# Patient Record
Sex: Male | Born: 1980 | Race: White | Hispanic: No | Marital: Single | Smoking: Never smoker
Health system: Southern US, Community
[De-identification: ages and names within clinical notes are randomized; demographics above are authoritative.]

## PROBLEM LIST (undated history)

## (undated) ENCOUNTER — Emergency Department (HOSPITAL_COMMUNITY): Admission: EM | Payer: Self-pay | Source: Home / Self Care

## (undated) DIAGNOSIS — F419 Anxiety disorder, unspecified: Secondary | ICD-10-CM

## (undated) DIAGNOSIS — M1990 Unspecified osteoarthritis, unspecified site: Secondary | ICD-10-CM

## (undated) DIAGNOSIS — E348 Other specified endocrine disorders: Secondary | ICD-10-CM

## (undated) DIAGNOSIS — T8859XA Other complications of anesthesia, initial encounter: Secondary | ICD-10-CM

## (undated) DIAGNOSIS — F4310 Post-traumatic stress disorder, unspecified: Secondary | ICD-10-CM

## (undated) HISTORY — DX: Unspecified osteoarthritis, unspecified site: M19.90

## (undated) HISTORY — PX: ANKLE SURGERY: SHX546

## (undated) HISTORY — DX: Anxiety disorder, unspecified: F41.9

---
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
## 2000-09-03 ENCOUNTER — Inpatient Hospital Stay (HOSPITAL_COMMUNITY): Admission: EM | Admit: 2000-09-03 | Discharge: 2000-09-05 | Payer: Self-pay | Admitting: *Deleted

## 2002-10-17 ENCOUNTER — Emergency Department (HOSPITAL_COMMUNITY): Admission: EM | Admit: 2002-10-17 | Discharge: 2002-10-17 | Payer: Self-pay | Admitting: Emergency Medicine

## 2003-12-31 ENCOUNTER — Emergency Department (HOSPITAL_COMMUNITY): Admission: EM | Admit: 2003-12-31 | Discharge: 2003-12-31 | Payer: Self-pay | Admitting: Emergency Medicine

## 2010-04-11 ENCOUNTER — Emergency Department (HOSPITAL_BASED_OUTPATIENT_CLINIC_OR_DEPARTMENT_OTHER)
Admission: EM | Admit: 2010-04-11 | Discharge: 2010-04-11 | Disposition: A | Discharge status: Other Acute Inpatient Hospital | Payer: Self-pay | Source: Home / Self Care | Admitting: Emergency Medicine

## 2010-06-25 LAB — POCT CARDIAC MARKERS

## 2010-06-25 LAB — CBC
HCT: 41.9 % (ref 39.0–52.0)
Hemoglobin: 15 g/dL (ref 13.0–17.0)
MCH: 29.7 pg (ref 26.0–34.0)
MCHC: 35.8 g/dL (ref 30.0–36.0)
MCV: 83 fL (ref 78.0–100.0)

## 2010-06-25 LAB — BASIC METABOLIC PANEL
CO2: 24 mEq/L (ref 19–32)
Chloride: 106 mEq/L (ref 96–112)
Glucose, Bld: 90 mg/dL (ref 70–99)
Potassium: 4.4 mEq/L (ref 3.5–5.1)
Sodium: 142 mEq/L (ref 135–145)

## 2010-06-25 LAB — DIFFERENTIAL
Basophils Relative: 0 % (ref 0–1)
Eosinophils Absolute: 0 10*3/uL (ref 0.0–0.7)
Eosinophils Relative: 0 % (ref 0–5)
Monocytes Absolute: 0.9 10*3/uL (ref 0.1–1.0)
Monocytes Relative: 8 % (ref 3–12)
Neutro Abs: 9.5 10*3/uL — ABNORMAL HIGH (ref 1.7–7.7)

## 2010-06-25 LAB — POCT TOXICOLOGY PANEL

## 2010-06-25 LAB — URINALYSIS, ROUTINE W REFLEX MICROSCOPIC
Bilirubin Urine: NEGATIVE
Hgb urine dipstick: NEGATIVE
Specific Gravity, Urine: 1.016 (ref 1.005–1.030)
Urobilinogen, UA: 0.2 mg/dL (ref 0.0–1.0)
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
## 2011-03-10 ENCOUNTER — Encounter: Payer: Self-pay | Admitting: *Deleted

## 2011-03-10 ENCOUNTER — Emergency Department (HOSPITAL_BASED_OUTPATIENT_CLINIC_OR_DEPARTMENT_OTHER)
Admission: EM | Admit: 2011-03-10 | Discharge: 2011-03-10 | Disposition: A | Discharge status: Home / Self Care | Payer: Self-pay | Attending: Emergency Medicine | Admitting: Emergency Medicine

## 2011-03-10 DIAGNOSIS — F419 Anxiety disorder, unspecified: Secondary | ICD-10-CM

## 2011-03-10 DIAGNOSIS — F411 Generalized anxiety disorder: Secondary | ICD-10-CM | POA: Insufficient documentation

## 2011-03-10 DIAGNOSIS — E348 Other specified endocrine disorders: Secondary | ICD-10-CM | POA: Insufficient documentation

## 2011-03-10 DIAGNOSIS — F3289 Other specified depressive episodes: Secondary | ICD-10-CM | POA: Insufficient documentation

## 2011-03-10 DIAGNOSIS — F329 Major depressive disorder, single episode, unspecified: Secondary | ICD-10-CM

## 2011-03-10 DIAGNOSIS — F4310 Post-traumatic stress disorder, unspecified: Secondary | ICD-10-CM | POA: Insufficient documentation

## 2011-03-10 DIAGNOSIS — F17200 Nicotine dependence, unspecified, uncomplicated: Secondary | ICD-10-CM | POA: Insufficient documentation

## 2011-03-10 DIAGNOSIS — F32A Depression, unspecified: Secondary | ICD-10-CM

## 2011-03-10 HISTORY — DX: Post-traumatic stress disorder, unspecified: F43.10

## 2011-03-10 HISTORY — DX: Other specified endocrine disorders: E34.8

## 2011-03-10 MED ORDER — LORAZEPAM 2 MG/ML IJ SOLN
2.0000 mg | Freq: Once | INTRAMUSCULAR | Status: AC
  Administered 2011-03-10: 2 mg via INTRAMUSCULAR
  Filled 2011-03-10: qty 1

## 2011-03-10 NOTE — ED Provider Notes (Signed)
History     CSN: [id number] Arrival date & time: 03/10/2011  9:52 PM   First MD Initiated Contact with Patient 03/10/11 2154      Chief Complaint  Patient presents with  . Depression    (Consider location/radiation/quality/duration/timing/severity/associated sxs/prior treatment) HPI Comments: Pt states that his girlfriend broke up with him and he is still dealing with his brother being killed:pt denies si/hi:family states that pt has been very volatile with his mood today, he goes from being very angry to being very sad:pt states that he is out of his klonopin and that he is going to see his pcp tomorrow to talk about an antidepressant and more klonopin:pt states that he has been taking adderall and smoking marijuana:pt states that the adderall was his girlfriends  Patient is a 30 y.o. male presenting with anxiety. The history is provided by the patient. No language interpreter was used.  Anxiety This is a chronic problem. The current episode started in the past 7 days. The problem occurs constantly. The problem has been gradually worsening. Pertinent negatives include no fever, rash, vomiting or weakness. The symptoms are aggravated by nothing. He has tried nothing for the symptoms.  Anxiety This is a chronic problem. The current episode started in the past 7 days. The problem occurs constantly. The problem has been gradually worsening. The symptoms are aggravated by nothing. He has tried nothing for the symptoms.    Past Medical History  Diagnosis Date  . Post traumatic stress disorder   . Disorder of pineal gland     History reviewed. No pertinent past surgical history.  History reviewed. No pertinent family history.  History  Substance Use Topics  . Smoking status: Current Everyday Smoker  . Smokeless tobacco: Not on file  . Alcohol Use: No      Review of Systems  Constitutional: Negative for fever.  Gastrointestinal: Negative for vomiting.  Skin: Negative for rash.    Neurological: Negative for weakness.  All other systems reviewed and are negative.    Allergies  Alupent  Home Medications   Current Outpatient Rx  Name Route Sig Dispense Refill  . ALPRAZOLAM 1 MG PO TABS Oral Take 1 mg by mouth once. For anxiety    . AMPHETAMINE-DEXTROAMPHETAMINE 20 MG PO TABS Oral Take 20 mg by mouth daily.      Marland Kitchen CLONAZEPAM 1 MG PO TABS Oral Take 1 mg by mouth 2 (two) times daily as needed. For anxiety    . ONE-DAILY MULTI VITAMINS PO TABS Oral Take 1 tablet by mouth daily.        BP 130/88  Pulse 88  Temp(Src) 98.4 F (36.9 C) (Oral)  SpO2 97%  Physical Exam  Nursing note and vitals reviewed. Constitutional: He is oriented to person, place, and time. He appears well-developed and well-nourished.  Cardiovascular: Normal rate and regular rhythm.   Pulmonary/Chest: Effort normal and breath sounds normal.  Neurological: He is alert and oriented to person, place, and time.  Skin: Skin is warm and dry.  Psychiatric: His mood appears anxious. His affect is labile. His speech is not slurred. He is not withdrawn and not actively hallucinating. Thought content is not paranoid and not delusional. Cognition and memory are not impaired. He does not express impulsivity. He expresses no suicidal ideation. He expresses no suicidal plans and no homicidal plans.    ED Course  Procedures (including critical care time)  Labs Reviewed - No data to display No results found.   1. Anxiety  2. Depression       MDM  Pt is not homicidal or suicidal:pt is out of klonopin:will give a dose of medication here tonight and pt can follow up with JXB:JYNWGN and pt agree with plann       Teressa Lower, NP 03/10/11 2306  Medical screening examination/treatment/procedure(s) were performed by non-physician practitioner and as supervising physician I was immediately available for consultation/collaboration.   Sunnie Nielsen, MD 03/11/11 [phone]

## 2011-03-10 NOTE — ED Notes (Signed)
Per pt he has been somewhat depressed the past few days states that his brother passed away about a year ago and his birthday was today as well as holidays pt also broke up with girlfriend and is out of his clonapin

## 2011-03-11 ENCOUNTER — Emergency Department (HOSPITAL_COMMUNITY)
Admission: EM | Admit: 2011-03-11 | Discharge: 2011-03-13 | Disposition: A | Discharge status: Other Acute Inpatient Hospital | Payer: Self-pay | Attending: Emergency Medicine | Admitting: Emergency Medicine

## 2011-03-11 DIAGNOSIS — F29 Unspecified psychosis not due to a substance or known physiological condition: Secondary | ICD-10-CM | POA: Insufficient documentation

## 2011-03-11 DIAGNOSIS — IMO0002 Reserved for concepts with insufficient information to code with codable children: Secondary | ICD-10-CM | POA: Insufficient documentation

## 2011-03-11 LAB — COMPREHENSIVE METABOLIC PANEL
ALT: 19 U/L (ref 0–53)
Alkaline Phosphatase: 74 U/L (ref 39–117)
CO2: 26 mEq/L (ref 19–32)
Calcium: 10 mg/dL (ref 8.4–10.5)
GFR calc Af Amer: >90 mL/min (ref >90)
GFR calc non Af Amer: 85 mL/min — ABNORMAL LOW (ref >90)
Glucose, Bld: 91 mg/dL (ref 70–99)
Potassium: 4.5 mEq/L (ref 3.5–5.1)
Sodium: 136 mEq/L (ref 135–145)
Total Bilirubin: 0.3 mg/dL (ref 0.3–1.2)

## 2011-03-11 LAB — CBC
MCHC: 35.1 g/dL (ref 30.0–36.0)
Platelets: 267 10*3/uL (ref 150–400)
RDW: 12.6 % (ref 11.5–15.5)

## 2011-03-11 LAB — RAPID URINE DRUG SCREEN, HOSP PERFORMED
Barbiturates: NOT DETECTED
Benzodiazepines: NOT DETECTED
Cocaine: NOT DETECTED
Opiates: NOT DETECTED
Tetrahydrocannabinol: POSITIVE — AB

## 2011-03-11 LAB — ETHANOL: Alcohol, Ethyl (B): <11 mg/dL (ref 0–11)

## 2011-03-11 MED ORDER — LORAZEPAM 2 MG/ML IJ SOLN
2.0000 mg | Freq: Once | INTRAMUSCULAR | Status: DC
  Filled 2011-03-11: qty 1

## 2011-03-11 MED ORDER — HALOPERIDOL LACTATE 5 MG/ML IJ SOLN
INTRAMUSCULAR | Status: AC
  Administered 2011-03-11: 16:00:00
  Filled 2011-03-11: qty 2

## 2011-03-11 MED ORDER — ZOLPIDEM TARTRATE 5 MG PO TABS
5.0000 mg | ORAL_TABLET | Freq: Every evening | ORAL | Status: DC | PRN
  Administered 2011-03-12: 5 mg via ORAL
  Filled 2011-03-11: qty 1

## 2011-03-11 MED ORDER — LORAZEPAM 2 MG/ML IJ SOLN
INTRAMUSCULAR | Status: AC
  Filled 2011-03-11: qty 1

## 2011-03-11 MED ORDER — LORAZEPAM 2 MG/ML IJ SOLN
INTRAMUSCULAR | Status: AC
  Administered 2011-03-11: 16:00:00
  Filled 2011-03-11: qty 1

## 2011-03-11 MED ORDER — LORAZEPAM 2 MG/ML IJ SOLN
2.0000 mg | Freq: Once | INTRAMUSCULAR | Status: AC
  Administered 2011-03-11: 2 mg via INTRAVENOUS

## 2011-03-11 MED ORDER — LORAZEPAM 1 MG PO TABS
2.0000 mg | ORAL_TABLET | Freq: Four times a day (QID) | ORAL | Status: DC | PRN
  Administered 2011-03-12 – 2011-03-13 (×4): 2 mg via ORAL
  Filled 2011-03-11 (×4): qty 2

## 2011-03-11 NOTE — ED Notes (Signed)
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

## 2011-03-11 NOTE — ED Notes (Signed)
psychosis

## 2011-03-11 NOTE — ED Notes (Signed)
Pt. Resting. VSS REspirations even unlabored.  No violent behavior.

## 2011-03-11 NOTE — ED Provider Notes (Signed)
History     CSN: [id number] Arrival date & time: 03/11/2011  2:23 PM   First MD Initiated Contact with Patient 03/11/11 1525      Chief Complaint  Patient presents with  . Medical Clearance   level V caveat patient psychotic. History is obtained from mother (Consider location/radiation/quality/duration/timing/severity/associated sxs/prior treatment) HPI Mother reports that patient has been clainming that he is God for 2 days, and that she fears that he has had aggressive behavior at home and may become violent;. No treatment prior to arrival. Past Medical History  Diagnosis Date  . Post traumatic stress disorder   . Disorder of pineal gland    psychosis No past surgical history on file.  No family history on file.  History  Substance Use Topics  . Smoking status: Current Everyday Smoker  . Smokeless tobacco: Not on file  . Alcohol Use: No      Review of Systems  Unable to perform ROS: Psychiatric disorder    Allergies  Alupent  Home Medications   Current Outpatient Rx  Name Route Sig Dispense Refill  . ALPRAZOLAM 1 MG PO TABS Oral Take 1 mg by mouth once. For anxiety    . AMPHETAMINE-DEXTROAMPHETAMINE 20 MG PO TABS Oral Take 20 mg by mouth daily.      Marland Kitchen CLONAZEPAM 1 MG PO TABS Oral Take 1 mg by mouth 2 (two) times daily as needed. For anxiety    . IBUPROFEN 200 MG PO TABS Oral Take 200 mg by mouth every 6 (six) hours as needed. pain     . MELATONIN 10 MG PO TABS Oral Take 1 tablet by mouth at bedtime as needed. sleep     . ONE-DAILY MULTI VITAMINS PO TABS Oral Take 1 tablet by mouth daily.        There were no vitals taken for this visit.  Physical Exam  Vitals reviewed. Constitutional: He appears well-developed. He appears distressed.       Agitated,, yelling stating he's going to die and everyone here is going to die  HENT:  Head: Normocephalic and atraumatic.  Eyes: Pupils are equal, round, and reactive to light.  Neck: Normal range of motion.    Cardiovascular: Normal rate and regular rhythm.   Pulmonary/Chest: Effort normal and breath sounds normal.  Abdominal: He exhibits no distension. There is no tenderness.  Neurological: He is alert.       Gait normal  Psychiatric:       Agitated hostile    ED Course  Procedures (including critical care time) 4 PM patient ran out of the emergency department security guards and police retrieved him from the waiting room, he required tazing and the police as he became violent, he was brought back to the emergency department placed in 4. restraints as he was combative attempting to harm staff, Haldol 10 mg IM ordered by me at 4:35 PM patient still slightly agitated though more cooperative and apologizing for his behavior,Iv esatblished by rn Ativan 2 mg IV ordered by me Labs Reviewed  CBC - Abnormal; Notable for the following:    WBC 13.3 (*)    All other components within normal limits  COMPREHENSIVE METABOLIC PANEL  ETHANOL  DRUGS OF ABUSE SCREEN W ALC, ROUTINE URINE  URINE RAPID DRUG SCREEN (HOSP PERFORMED)   5:50 PM patient is resting comfortably. Arousable to verbal stimulus. Follow simple commands, answers simple questions. Act team consult and to evaluate patient for psychiatric admission No results found.   No diagnosis  found.  Results for orders placed during the hospital encounter of 03/11/11  COMPREHENSIVE METABOLIC PANEL      Component Value Range   Sodium 136  135 - 145 (mEq/L)   Potassium 4.5  3.5 - 5.1 (mEq/L)   Chloride 101  96 - 112 (mEq/L)   CO2 26  19 - 32 (mEq/L)   Glucose, Bld 91  70 - 99 (mg/dL)   BUN 15  6 - 23 (mg/dL)   Creatinine, Ser 1.61  0.50 - 1.35 (mg/dL)   Calcium 09.6  8.4 - 10.5 (mg/dL)   Total Protein 7.8  6.0 - 8.3 (g/dL)   Albumin 4.6  3.5 - 5.2 (g/dL)   AST 20  0 - 37 (U/L)   ALT 19  0 - 53 (U/L)   Alkaline Phosphatase 74  39 - 117 (U/L)   Total Bilirubin 0.3  0.3 - 1.2 (mg/dL)   GFR calc non Af Amer 85 (*) >90 (mL/min)   GFR calc Af Amer  >90  >90 (mL/min)  CBC      Component Value Range   WBC 13.3 (*) 4.0 - 10.5 (K/uL)   RBC 5.51  4.22 - 5.81 (MIL/uL)   Hemoglobin 16.7  13.0 - 17.0 (g/dL)   HCT 04.5  40.9 - 81.1 (%)   MCV 86.4  78.0 - 100.0 (fL)   MCH 30.3  26.0 - 34.0 (pg)   MCHC 35.1  30.0 - 36.0 (g/dL)   RDW 91.4  78.2 - 95.6 (%)   Platelets 267  150 - 400 (K/uL)  ETHANOL      Component Value Range   Alcohol, Ethyl (B) <11  0 - 11 (mg/dL)  URINE RAPID DRUG SCREEN (HOSP PERFORMED)      Component Value Range   Opiates NONE DETECTED  NONE DETECTED    Cocaine NONE DETECTED  NONE DETECTED    Benzodiazepines PENDING  NONE DETECTED    Amphetamines NONE DETECTED  NONE DETECTED    Tetrahydrocannabinol POSITIVE (*) NONE DETECTED    Barbiturates NONE DETECTED  NONE DETECTED   URINE RAPID DRUG SCREEN (HOSP PERFORMED)      Component Value Range   Opiates NONE DETECTED  NONE DETECTED    Cocaine NONE DETECTED  NONE DETECTED    Benzodiazepines NONE DETECTED  NONE DETECTED    Amphetamines NONE DETECTED  NONE DETECTED    Tetrahydrocannabinol POSITIVE (*) NONE DETECTED    Barbiturates NONE DETECTED  NONE DETECTED    No results found.   MDM  Involuntary commitment papers for psychiatric evaluation followed by me, as patient violent,, psychotic and felt to be harm potentially to self or others Diagnosis acute psychosis   CRITICAL CARE Performed by: Doug Sou   Total critical care time: 60 minute  Critical care time was exclusive of separately billable procedures and treating other patients.  Critical care was necessary to treat or prevent imminent or life-threatening deterioration.  Critical care was time spent personally by me on the following activities: development of treatment plan with patient and/or surrogate as well as nursing, discussions with consultants, evaluation of patient's response to treatment, examination of patient, obtaining history from patient or surrogate, ordering and performing treatments  and interventions, ordering and review of laboratory studies, ordering and review of radiographic studies, pulse oximetry and re-evaluation of patient's condition.    Doug Sou, MD 03/11/11 [phone]

## 2011-03-12 ENCOUNTER — Encounter (HOSPITAL_COMMUNITY): Payer: Self-pay | Admitting: *Deleted

## 2011-03-12 DIAGNOSIS — F29 Unspecified psychosis not due to a substance or known physiological condition: Secondary | ICD-10-CM

## 2011-03-12 MED ORDER — CLONAZEPAM 1 MG PO TABS
1.0000 mg | ORAL_TABLET | Freq: Two times a day (BID) | ORAL | Status: DC
  Administered 2011-03-12 – 2011-03-13 (×3): 1 mg via ORAL
  Filled 2011-03-12 (×4): qty 1

## 2011-03-12 MED ORDER — ZIPRASIDONE MESYLATE 20 MG IM SOLR
10.0000 mg | Freq: Once | INTRAMUSCULAR | Status: AC
  Administered 2011-03-12: 20 mg via INTRAMUSCULAR
  Filled 2011-03-12: qty 20

## 2011-03-12 MED ORDER — RISPERIDONE 1 MG PO TABS
1.0000 mg | ORAL_TABLET | Freq: Two times a day (BID) | ORAL | Status: DC
  Administered 2011-03-12: 1 mg via ORAL
  Filled 2011-03-12: qty 1

## 2011-03-12 NOTE — ED Notes (Signed)
[facility] has given the following information for [facility]: Authorization number is [id number] for dates 03/12/11 to 03/21/11. Writer contacted [facility] to provide authorization information and spoke with Vonna Kotyk at [phone].

## 2011-03-12 NOTE — ED Provider Notes (Signed)
Vitals normal, alert, content. Act eval pending. Dr A to see.   Suzi Roots, MD 03/12/11 0900

## 2011-03-12 NOTE — Consult Note (Signed)
Patient Identification:  ELVA BREAKER Date of Evaluation:  03/12/2011   History of Present Illness: I reviewed behavioral health assessment. 30 year old Caucasian male with history of schizophrenia paranoid type and anxiety disorder/PTSD is under IVC as patient attempted to leave the ED and was stopped by the security. Patient was combative and was tazed. When I asked the patient about it patient gave a irrational explanation. Patient is irritable and paranoid during the interview. He denied hearing voices. He reported having panic attacks because of the brothers death 1-1/2 years ago.  Past Medical History:     Past Medical History  Diagnosis Date  . Post traumatic stress disorder   . Disorder of pineal gland       History reviewed. No pertinent past surgical history.  Allergies:  Allergies  Allergen Reactions  . Alupent (Metaproterenol Sulfate)     Current Medications:  Prior to Admission medications   Medication Sig Start Date End Date Taking? Authorizing Provider  ALPRAZolam Prudy Feeler) 1 MG tablet Take 1 mg by mouth once. For anxiety   Yes Historical Provider, MD  amphetamine-dextroamphetamine (ADDERALL) 20 MG tablet Take 20 mg by mouth daily.     Yes Historical Provider, MD  clonazePAM (KLONOPIN) 1 MG tablet Take 1 mg by mouth 2 (two) times daily as needed. For anxiety   Yes Historical Provider, MD  ibuprofen (ADVIL,MOTRIN) 200 MG tablet Take 200 mg by mouth every 6 (six) hours as needed. pain    Yes Historical Provider, MD  Melatonin 10 MG TABS Take 1 tablet by mouth at bedtime as needed. sleep    Yes Historical Provider, MD  Multiple Vitamin (MULTIVITAMIN) tablet Take 1 tablet by mouth daily.     Yes Historical Provider, MD    Social History:    reports that he has been smoking.  He does not have any smokeless tobacco history on file. He reports that he uses illicit drugs (Marijuana). He reports that he does not drink alcohol.  Diagnoses psychoses NOS, anxiety disorder  NOS   Recommendations: [facility] because of his combative behavior and I will start the patient on Risperdal 1 mg twice a day along with Klonopin 1 mg twice a day.    Eulogio Ditch, MD
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
## 2011-03-12 NOTE — ED Notes (Signed)
Patient request items for shower. Patient given soap towels new scrubs at bedside

## 2011-03-12 NOTE — Progress Notes (Signed)
This Clinical research associate completed referral paperwork for [facility] and called in referral. Oncoming staff should ensure patient has been placed on waiting list and authorization number that was requested from [facility] is obtained.  Ileene Hutchinson , MSW, LCSWA 03/12/2011 2:38 PM

## 2011-03-12 NOTE — ED Notes (Signed)
Patient is sleeping and not responding to name being called. pt came from bathroom saw tray but did not eat. Unable to get VS. Pt does not want to be bothered.

## 2011-03-12 NOTE — ED Notes (Signed)
Mom is going home. Left note in chart to call her if he acts up. Home # [phone].
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
## 2011-03-12 NOTE — ED Notes (Signed)
Sitter at bedside.

## 2011-04-18 ENCOUNTER — Ambulatory Visit (INDEPENDENT_AMBULATORY_CARE_PROVIDER_SITE_OTHER): Payer: Self-pay | Admitting: Psychology

## 2011-04-18 DIAGNOSIS — F259 Schizoaffective disorder, unspecified: Secondary | ICD-10-CM

## 2011-04-29 ENCOUNTER — Ambulatory Visit (INDEPENDENT_AMBULATORY_CARE_PROVIDER_SITE_OTHER): Payer: Self-pay | Admitting: Psychology

## 2011-04-29 DIAGNOSIS — F259 Schizoaffective disorder, unspecified: Secondary | ICD-10-CM

## 2012-06-22 ENCOUNTER — Encounter (HOSPITAL_BASED_OUTPATIENT_CLINIC_OR_DEPARTMENT_OTHER): Payer: Self-pay | Admitting: Family Medicine

## 2012-06-22 ENCOUNTER — Emergency Department (HOSPITAL_BASED_OUTPATIENT_CLINIC_OR_DEPARTMENT_OTHER)
Admission: EM | Admit: 2012-06-22 | Discharge: 2012-06-22 | Disposition: A | Discharge status: Home / Self Care | Payer: Self-pay | Attending: Emergency Medicine | Admitting: Emergency Medicine

## 2012-06-22 DIAGNOSIS — R6883 Chills (without fever): Secondary | ICD-10-CM | POA: Insufficient documentation

## 2012-06-22 DIAGNOSIS — R5383 Other fatigue: Secondary | ICD-10-CM | POA: Insufficient documentation

## 2012-06-22 DIAGNOSIS — J029 Acute pharyngitis, unspecified: Secondary | ICD-10-CM | POA: Insufficient documentation

## 2012-06-22 DIAGNOSIS — F17200 Nicotine dependence, unspecified, uncomplicated: Secondary | ICD-10-CM | POA: Insufficient documentation

## 2012-06-22 DIAGNOSIS — R5381 Other malaise: Secondary | ICD-10-CM | POA: Insufficient documentation

## 2012-06-22 DIAGNOSIS — E348 Other specified endocrine disorders: Secondary | ICD-10-CM | POA: Insufficient documentation

## 2012-06-22 DIAGNOSIS — J3489 Other specified disorders of nose and nasal sinuses: Secondary | ICD-10-CM | POA: Insufficient documentation

## 2012-06-22 DIAGNOSIS — R52 Pain, unspecified: Secondary | ICD-10-CM | POA: Insufficient documentation

## 2012-06-22 DIAGNOSIS — Z8659 Personal history of other mental and behavioral disorders: Secondary | ICD-10-CM | POA: Insufficient documentation

## 2012-06-22 DIAGNOSIS — Z79899 Other long term (current) drug therapy: Secondary | ICD-10-CM | POA: Insufficient documentation

## 2012-06-22 DIAGNOSIS — J069 Acute upper respiratory infection, unspecified: Secondary | ICD-10-CM | POA: Insufficient documentation

## 2012-06-22 MED ORDER — MORPHINE SULFATE 4 MG/ML IJ SOLN: 4.0000 mg | Freq: Once | INTRAMUSCULAR | Status: DC

## 2012-06-22 MED ORDER — PANTOPRAZOLE SODIUM 40 MG IV SOLR: 40.0000 mg | Freq: Once | INTRAVENOUS | Status: DC

## 2012-06-22 MED ORDER — BENZONATATE 100 MG PO CAPS
100.0000 mg | ORAL_CAPSULE | Freq: Three times a day (TID) | ORAL | Status: DC
Start: 2012-06-22 — End: 2013-08-08

## 2012-06-22 MED ORDER — ALBUTEROL SULFATE HFA 108 (90 BASE) MCG/ACT IN AERS
2.0000 | INHALATION_SPRAY | Freq: Once | RESPIRATORY_TRACT | Status: AC
  Administered 2012-06-22: 2 via RESPIRATORY_TRACT
  Filled 2012-06-22: qty 6.7

## 2012-06-22 MED ORDER — SODIUM CHLORIDE 0.9 % IV SOLN: Freq: Once | INTRAVENOUS | Status: DC

## 2012-06-22 MED ORDER — ONDANSETRON HCL 4 MG/2ML IJ SOLN: 4.0000 mg | Freq: Once | INTRAMUSCULAR | Status: DC

## 2012-06-22 MED ORDER — IBUPROFEN 800 MG PO TABS: 800.0000 mg | ORAL_TABLET | Freq: Three times a day (TID) | ORAL | Status: DC

## 2012-06-22 NOTE — ED Notes (Addendum)
Pt c/o cough, congestion and body aches x 2 days. Pt denies v/d, fever.

## 2012-06-22 NOTE — ED Notes (Signed)
pa at bedside. 

## 2012-06-22 NOTE — ED Provider Notes (Signed)
History     CSN: [id number]  Arrival date & time 06/22/12  1119   First MD Initiated Contact with Patient 06/22/12 1200      Chief Complaint  Patient presents with  . Generalized Body Aches  . Cough    (Consider location/radiation/quality/duration/timing/severity/associated sxs/prior treatment) HPI PHUONG MOFFATT is a 32 y.o. male who presents to ED with complaint of body aches, sore throat, cough. States symptoms began 2 days ago. States has had sick contact at work. Took over the counter flu medication x1 yesterday with no relief. Unsure of fever, did not measure. No neck pain or stiffness. No shortness of breath. States was able to run 4 miles yesterday, but symptoms worsened today so came in to get "checked." Denies anything making symptoms better or worse. Did not take any medications today.     Past Medical History  Diagnosis Date  . Post traumatic stress disorder   . Disorder of pineal gland     History reviewed. No pertinent past surgical history.  No family history on file.  History  Substance Use Topics  . Smoking status: Current Every Day Smoker  . Smokeless tobacco: Not on file  . Alcohol Use: No      Review of Systems  Constitutional: Positive for chills and fatigue. Negative for fever.  HENT: Positive for congestion and sore throat. Negative for neck pain, neck stiffness and sinus pressure.   Eyes: Negative for photophobia and redness.  Respiratory: Positive for cough. Negative for chest tightness and shortness of breath.   Cardiovascular: Negative for chest pain and leg swelling.  Gastrointestinal: Negative for nausea, vomiting and abdominal pain.  Genitourinary: Negative for dysuria.  Musculoskeletal: Positive for myalgias.  Skin: Negative for rash.  Allergic/Immunologic: Negative for immunocompromised state.  Neurological: Negative for weakness, numbness and headaches.    Allergies  Alupent  Home Medications   Current Outpatient Rx  Name   Route  Sig  Dispense  Refill  . ALPRAZolam (XANAX) 1 MG tablet   Oral   Take 1 mg by mouth once. For anxiety         . amphetamine-dextroamphetamine (ADDERALL) 20 MG tablet   Oral   Take 20 mg by mouth daily.           . clonazePAM (KLONOPIN) 1 MG tablet   Oral   Take 1 mg by mouth 2 (two) times daily as needed. For anxiety         . ibuprofen (ADVIL,MOTRIN) 200 MG tablet   Oral   Take 200 mg by mouth every 6 (six) hours as needed. pain          . Melatonin 10 MG TABS   Oral   Take 1 tablet by mouth at bedtime as needed. sleep          . Multiple Vitamin (MULTIVITAMIN) tablet   Oral   Take 1 tablet by mouth daily.             BP 136/84  Pulse 82  Temp(Src) 98.1 F (36.7 C) (Oral)  Resp 20  Ht 5\' 11"  (1.803 m)  Wt 190 lb (86.183 kg)  BMI 26.51 kg/m2  SpO2 96%  Physical Exam  Nursing note and vitals reviewed. Constitutional: He appears well-developed and well-nourished. No distress.  HENT:  Head: Normocephalic and atraumatic.  Right Ear: External ear normal.  Left Ear: External ear normal.  Mouth/Throat: Oropharynx is clear and moist.  Clear rhinorrhea   Eyes: Conjunctivae are normal.  Neck: Normal range of motion. Neck supple.  Cardiovascular: Normal rate, regular rhythm and normal heart sounds.   Pulmonary/Chest: Effort normal. No respiratory distress. He has wheezes. He has no rales. He exhibits no tenderness.  Expiratory wheezes in all lung fields.   Musculoskeletal: He exhibits no edema.  Neurological: He is alert.  Skin: Skin is warm and dry. No rash noted.    ED Course  Procedures (including critical care time)  Labs Reviewed - No data to display No results found.   1. Viral URI       MDM  PT with URI symtpoms for 2 days. Here afebrile. Non toxic appearing. Pt is a smoker, but states just recently quit (few weeks ago). Wheezing heard on exam. Suspect viral URI vs bronchitis vs influenza. doubt pneumonia, afebrile, normal vs. Will  treat with albuterol inhaler for wheezing and cough, tessalon perles. Tylenol or motrin for body aches. Follow up with PCP in 2 days.         Lottie Mussel, PA-C 06/22/12 1231

## 2012-06-23 NOTE — ED Provider Notes (Signed)
Medical screening examination/treatment/procedure(s) were performed by non-physician practitioner and as supervising physician I was immediately available for consultation/collaboration.   Suzi Roots, MD 06/23/12 2030

## 2012-11-08 ENCOUNTER — Emergency Department (HOSPITAL_BASED_OUTPATIENT_CLINIC_OR_DEPARTMENT_OTHER): Payer: Self-pay

## 2012-11-08 ENCOUNTER — Encounter (HOSPITAL_BASED_OUTPATIENT_CLINIC_OR_DEPARTMENT_OTHER): Payer: Self-pay

## 2012-11-08 ENCOUNTER — Emergency Department (HOSPITAL_BASED_OUTPATIENT_CLINIC_OR_DEPARTMENT_OTHER)
Admission: EM | Admit: 2012-11-08 | Discharge: 2012-11-08 | Disposition: A | Discharge status: Home / Self Care | Payer: Self-pay | Attending: Emergency Medicine | Admitting: Emergency Medicine

## 2012-11-08 DIAGNOSIS — M25549 Pain in joints of unspecified hand: Secondary | ICD-10-CM | POA: Insufficient documentation

## 2012-11-08 DIAGNOSIS — Z8659 Personal history of other mental and behavioral disorders: Secondary | ICD-10-CM | POA: Insufficient documentation

## 2012-11-08 DIAGNOSIS — F17200 Nicotine dependence, unspecified, uncomplicated: Secondary | ICD-10-CM | POA: Insufficient documentation

## 2012-11-08 DIAGNOSIS — M79641 Pain in right hand: Secondary | ICD-10-CM

## 2012-11-08 DIAGNOSIS — Z8639 Personal history of other endocrine, nutritional and metabolic disease: Secondary | ICD-10-CM | POA: Insufficient documentation

## 2012-11-08 DIAGNOSIS — Z87828 Personal history of other (healed) physical injury and trauma: Secondary | ICD-10-CM | POA: Insufficient documentation

## 2012-11-08 DIAGNOSIS — Z862 Personal history of diseases of the blood and blood-forming organs and certain disorders involving the immune mechanism: Secondary | ICD-10-CM | POA: Insufficient documentation

## 2012-11-08 DIAGNOSIS — Z79899 Other long term (current) drug therapy: Secondary | ICD-10-CM | POA: Insufficient documentation

## 2012-11-08 MED ORDER — IBUPROFEN 800 MG PO TABS: 800.0000 mg | ORAL_TABLET | Freq: Three times a day (TID) | ORAL | Status: DC

## 2012-11-08 MED ORDER — HYDROCODONE-ACETAMINOPHEN 5-325 MG PO TABS
1.0000 | ORAL_TABLET | Freq: Once | ORAL | Status: AC
  Administered 2012-11-08: 1 via ORAL
  Filled 2012-11-08: qty 1

## 2012-11-08 MED ORDER — HYDROCODONE-ACETAMINOPHEN 5-325 MG PO TABS: 2.0000 | ORAL_TABLET | ORAL | Status: DC | PRN

## 2012-11-08 NOTE — ED Provider Notes (Signed)
CSN: [id number]     Arrival date & time 11/08/12  1342 History     First MD Initiated Contact with Patient 11/08/12 1343     No chief complaint on file.  (Consider location/radiation/quality/duration/timing/severity/associated sxs/prior Treatment) HPI  32 year-old male presents for evaluation of hand pain.  Patient reports he injured his right thumb playing rugby 7 years ago.  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  Since working as a Financial risk analyst he has do repetitive motion including lifting pots and pans. Reports gradual pain and tenderness to his right thumb and right hand with repetitive motion. Yesterday, while lifting a pain he felt a pop and then experiencing an acute onset of sharp burning pain throughout both his thumb and his index finger on his right hand. Pain is persistent, worsening with movement and with gripping. He also noticed that his index finger is "bent".  At home he took ibuprofen with minimal relief. He is here for further evaluation. Patient denies wrist or elbow pain. Denies any specific trauma. Denies any numbness sensation. Denies any rash. He is right-hand dominant.   Past Medical History  Diagnosis Date  . Post traumatic stress disorder   . Disorder of pineal gland    No past surgical history on file. No family history on file. History  Substance Use Topics  . Smoking status: Current Every Day Smoker  . Smokeless tobacco: Not on file  . Alcohol Use: No    Review of Systems  Constitutional: Negative for fever.  Musculoskeletal: Positive for joint swelling and arthralgias.  Neurological: Negative for numbness.    Allergies  Alupent  Home Medications   Current Outpatient Rx  Name  Route  Sig  Dispense  Refill  . ALPRAZolam (XANAX) 1 MG tablet   Oral   Take 1 mg by mouth once. For anxiety         . amphetamine-dextroamphetamine (ADDERALL) 20 MG tablet   Oral   Take 20 mg by mouth daily.           .  benzonatate (TESSALON) 100 MG capsule   Oral   Take 1 capsule (100 mg total) by mouth every 8 (eight) hours.   21 capsule   0   . clonazePAM (KLONOPIN) 1 MG tablet   Oral   Take 1 mg by mouth 2 (two) times daily as needed. For anxiety         . ibuprofen (ADVIL,MOTRIN) 200 MG tablet   Oral   Take 200 mg by mouth every 6 (six) hours as needed. pain          . ibuprofen (ADVIL,MOTRIN) 800 MG tablet   Oral   Take 1 tablet (800 mg total) by mouth 3 (three) times daily.   21 tablet   0   . Melatonin 10 MG TABS   Oral   Take 1 tablet by mouth at bedtime as needed. sleep          . Multiple Vitamin (MULTIVITAMIN) tablet   Oral   Take 1 tablet by mouth daily.            There were no vitals taken for this visit. Physical Exam  Nursing note and vitals reviewed. Constitutional: He appears well-developed and well-nourished. No distress.  HENT:  Head: Atraumatic.  Eyes: Conjunctivae are normal.  Neck: Neck supple.  Musculoskeletal: He exhibits tenderness (right hand: Tenderness along the PIP, MCP, and thenar eminence of  right thumb. Tenderness of PIP and MCP of right index finger. Full range of motion decreased strength to both fingers secondary to pain. Decreased grip strength. Brisk capillary refills).  Neurological: He is alert.  Skin: Skin is warm. No rash noted.  Psychiatric: He has a normal mood and affect.    ED Course   Procedures (including critical care time)  2:01 PM R hand pain from repetitive activity. Xray ordered, pain medication given.   2:28 PM Xray without acute fx or dislocation.  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  Will provide wrist brace with thumb spica for support.  RICE therapy.  Work note, pain medication.   Labs Reviewed - No data to display Dg Hand Complete Right  11/08/2012   *RADIOLOGY REPORT*  Clinical Data: Pain  RIGHT HAND - COMPLETE 3+ VIEW  Comparison: None.  Findings: Frontal, oblique, and lateral views were  obtained.  No fracture or dislocation.  There is osteoarthritic change in the saddle joint.  Other joint spaces appear intact.  No erosive change.  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  No fracture or dislocation.   Original Report Authenticated By: Bretta Bang, M.D.   1. Right hand pain     MDM  BP 157/89  Pulse 77  Temp(Src) 98.5 F (36.9 C) (Oral)  Resp 16  Ht 5\' 8"  (1.727 m)  Wt 190 lb (86.183 kg)  BMI 28.9 kg/m2  SpO2 100%  I have reviewed nursing notes and vital signs. I personally reviewed the imaging tests through PACS system  I reviewed available ER/hospitalization records thought the EMR   Fayrene Helper, [place] 11/08/12 1436

## 2012-11-08 NOTE — ED Notes (Signed)
Pt reports right hand pain that started last pm while at work.

## 2012-11-09 NOTE — ED Provider Notes (Signed)
  Medical screening examination/treatment/procedure(s) were performed by non-physician practitioner and as supervising physician I was immediately available for consultation/collaboration.   Gerhard Munch, MD 11/09/12 1330

## 2013-08-08 ENCOUNTER — Emergency Department (HOSPITAL_COMMUNITY)
Admission: EM | Admit: 2013-08-08 | Discharge: 2013-08-08 | Disposition: A | Discharge status: Home / Self Care | Payer: Self-pay | Attending: Emergency Medicine | Admitting: Emergency Medicine

## 2013-08-08 ENCOUNTER — Emergency Department (HOSPITAL_COMMUNITY): Payer: Self-pay

## 2013-08-08 ENCOUNTER — Encounter (HOSPITAL_COMMUNITY): Payer: Self-pay | Admitting: Emergency Medicine

## 2013-08-08 DIAGNOSIS — F17200 Nicotine dependence, unspecified, uncomplicated: Secondary | ICD-10-CM | POA: Insufficient documentation

## 2013-08-08 DIAGNOSIS — S1093XA Contusion of unspecified part of neck, initial encounter: Secondary | ICD-10-CM

## 2013-08-08 DIAGNOSIS — Z79899 Other long term (current) drug therapy: Secondary | ICD-10-CM | POA: Insufficient documentation

## 2013-08-08 DIAGNOSIS — S0003XA Contusion of scalp, initial encounter: Secondary | ICD-10-CM | POA: Insufficient documentation

## 2013-08-08 DIAGNOSIS — S3981XA Other specified injuries of abdomen, initial encounter: Secondary | ICD-10-CM | POA: Insufficient documentation

## 2013-08-08 DIAGNOSIS — S20229A Contusion of unspecified back wall of thorax, initial encounter: Secondary | ICD-10-CM | POA: Insufficient documentation

## 2013-08-08 DIAGNOSIS — Q632 Ectopic kidney: Secondary | ICD-10-CM

## 2013-08-08 DIAGNOSIS — F4310 Post-traumatic stress disorder, unspecified: Secondary | ICD-10-CM | POA: Insufficient documentation

## 2013-08-08 DIAGNOSIS — Y929 Unspecified place or not applicable: Secondary | ICD-10-CM | POA: Insufficient documentation

## 2013-08-08 DIAGNOSIS — Q638 Other specified congenital malformations of kidney: Secondary | ICD-10-CM | POA: Insufficient documentation

## 2013-08-08 DIAGNOSIS — Y9389 Activity, other specified: Secondary | ICD-10-CM | POA: Insufficient documentation

## 2013-08-08 DIAGNOSIS — S0083XA Contusion of other part of head, initial encounter: Secondary | ICD-10-CM | POA: Insufficient documentation

## 2013-08-08 DIAGNOSIS — Z23 Encounter for immunization: Secondary | ICD-10-CM | POA: Insufficient documentation

## 2013-08-08 DIAGNOSIS — F419 Anxiety disorder, unspecified: Secondary | ICD-10-CM

## 2013-08-08 DIAGNOSIS — Z862 Personal history of diseases of the blood and blood-forming organs and certain disorders involving the immune mechanism: Secondary | ICD-10-CM | POA: Insufficient documentation

## 2013-08-08 DIAGNOSIS — S8990XA Unspecified injury of unspecified lower leg, initial encounter: Secondary | ICD-10-CM | POA: Insufficient documentation

## 2013-08-08 DIAGNOSIS — Z791 Long term (current) use of non-steroidal anti-inflammatories (NSAID): Secondary | ICD-10-CM | POA: Insufficient documentation

## 2013-08-08 DIAGNOSIS — S0181XA Laceration without foreign body of other part of head, initial encounter: Secondary | ICD-10-CM

## 2013-08-08 DIAGNOSIS — S99919A Unspecified injury of unspecified ankle, initial encounter: Secondary | ICD-10-CM

## 2013-08-08 DIAGNOSIS — IMO0002 Reserved for concepts with insufficient information to code with codable children: Secondary | ICD-10-CM | POA: Insufficient documentation

## 2013-08-08 DIAGNOSIS — F411 Generalized anxiety disorder: Secondary | ICD-10-CM | POA: Insufficient documentation

## 2013-08-08 DIAGNOSIS — S99929A Unspecified injury of unspecified foot, initial encounter: Secondary | ICD-10-CM

## 2013-08-08 DIAGNOSIS — S0100XA Unspecified open wound of scalp, initial encounter: Secondary | ICD-10-CM | POA: Insufficient documentation

## 2013-08-08 DIAGNOSIS — Z880 Allergy status to penicillin: Secondary | ICD-10-CM | POA: Insufficient documentation

## 2013-08-08 DIAGNOSIS — R109 Unspecified abdominal pain: Secondary | ICD-10-CM

## 2013-08-08 DIAGNOSIS — Z8639 Personal history of other endocrine, nutritional and metabolic disease: Secondary | ICD-10-CM | POA: Insufficient documentation

## 2013-08-08 LAB — COMPREHENSIVE METABOLIC PANEL
ALT: 19 U/L (ref 0–53)
AST: 19 U/L (ref 0–37)
Albumin: 4.5 g/dL (ref 3.5–5.2)
Alkaline Phosphatase: 57 U/L (ref 39–117)
BILIRUBIN TOTAL: 0.4 mg/dL (ref 0.3–1.2)
BUN: 9 mg/dL (ref 6–23)
CALCIUM: 9.7 mg/dL (ref 8.4–10.5)
CHLORIDE: 101 meq/L (ref 96–112)
CO2: 24 meq/L (ref 19–32)
CREATININE: 0.99 mg/dL (ref 0.50–1.35)
GLUCOSE: 100 mg/dL — AB (ref 70–99)
Potassium: 4.3 mEq/L (ref 3.7–5.3)
Sodium: 139 mEq/L (ref 137–147)
Total Protein: 7 g/dL (ref 6.0–8.3)

## 2013-08-08 LAB — URINE MICROSCOPIC-ADD ON

## 2013-08-08 LAB — CBC WITH DIFFERENTIAL/PLATELET
BASOS ABS: 0 10*3/uL (ref 0.0–0.1)
Basophils Relative: 0 % (ref 0–1)
EOS PCT: 0 % (ref 0–5)
Eosinophils Absolute: 0 10*3/uL (ref 0.0–0.7)
HEMATOCRIT: 42.3 % (ref 39.0–52.0)
HEMOGLOBIN: 14.9 g/dL (ref 13.0–17.0)
LYMPHS ABS: 1.3 10*3/uL (ref 0.7–4.0)
LYMPHS PCT: 12 % (ref 12–46)
MCH: 30.5 pg (ref 26.0–34.0)
MCHC: 35.2 g/dL (ref 30.0–36.0)
MCV: 86.5 fL (ref 78.0–100.0)
MONO ABS: 0.5 10*3/uL (ref 0.1–1.0)
MONOS PCT: 5 % (ref 3–12)
NEUTROS ABS: 8.6 10*3/uL — AB (ref 1.7–7.7)
Neutrophils Relative %: 83 % — ABNORMAL HIGH (ref 43–77)
Platelets: 235 10*3/uL (ref 150–400)
RBC: 4.89 MIL/uL (ref 4.22–5.81)
RDW: 12.7 % (ref 11.5–15.5)
WBC: 10.4 10*3/uL (ref 4.0–10.5)

## 2013-08-08 LAB — URINALYSIS, ROUTINE W REFLEX MICROSCOPIC
BILIRUBIN URINE: NEGATIVE
Glucose, UA: NEGATIVE mg/dL
Ketones, ur: NEGATIVE mg/dL
Leukocytes, UA: NEGATIVE
NITRITE: NEGATIVE
PROTEIN: 100 mg/dL — AB
Specific Gravity, Urine: 1.005 (ref 1.005–1.030)
UROBILINOGEN UA: 0.2 mg/dL (ref 0.0–1.0)
pH: 6.5 (ref 5.0–8.0)

## 2013-08-08 MED ORDER — KETOROLAC TROMETHAMINE 10 MG PO TABS
10.0000 mg | ORAL_TABLET | Freq: Once | ORAL | Status: AC
  Administered 2013-08-08: 10 mg via ORAL
  Filled 2013-08-08: qty 1

## 2013-08-08 MED ORDER — ACETAMINOPHEN 325 MG PO TABS: 650.0000 mg | ORAL_TABLET | Freq: Four times a day (QID) | ORAL | Status: DC | PRN

## 2013-08-08 MED ORDER — TETANUS-DIPHTH-ACELL PERTUSSIS 5-2.5-18.5 LF-MCG/0.5 IM SUSP
0.5000 mL | Freq: Once | INTRAMUSCULAR | Status: AC
Start: 2013-08-08 — End: 2013-08-08
  Administered 2013-08-08: 0.5 mL via INTRAMUSCULAR
  Filled 2013-08-08: qty 0.5

## 2013-08-08 MED ORDER — LORAZEPAM 1 MG PO TABS
1.0000 mg | ORAL_TABLET | Freq: Once | ORAL | Status: AC
  Administered 2013-08-08: 1 mg via ORAL
  Filled 2013-08-08: qty 1

## 2013-08-08 MED ORDER — IBUPROFEN 800 MG PO TABS: 800.0000 mg | ORAL_TABLET | Freq: Three times a day (TID) | ORAL | Status: DC

## 2013-08-08 MED ORDER — ACETAMINOPHEN 325 MG PO TABS
650.0000 mg | ORAL_TABLET | Freq: Once | ORAL | Status: AC
  Administered 2013-08-08: 650 mg via ORAL
  Filled 2013-08-08: qty 2

## 2013-08-08 NOTE — ED Provider Notes (Signed)
Medical screening examination/treatment/procedure(s) were performed by non-physician practitioner and as supervising physician I was immediately available for consultation/collaboration.   EKG Interpretation None        Lyanne CoKevin M Zane Pellecchia, MD 08/08/13 [phone]

## 2013-08-08 NOTE — ED Notes (Signed)
[facility] and [employer] at the bedside c/o laceration to the left side of head. He is alert and oriented and denies LOC. Pt also has several abrasions to toes on both feet. Pt denies SI and HI currently. He is IVC'd  For [facility] d/t danger to self and bizzare behavior. [facility] requested we evaluate pt for injuries and perform CBC w/ diff, BMP and Chemistry Panel before pt return.

## 2013-08-08 NOTE — ED Notes (Signed)
[employer] leaving bedside and [place] remaining at bedside.

## 2013-08-08 NOTE — ED Notes (Signed)
PA at bedside.
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
## 2013-08-08 NOTE — Discharge Instructions (Signed)
Please follow up with your primary care physician in 1-2 days. If you do not have one please call the [facility] number listed above. Please alternate between Motrin and Tylenol every three hours for fevers and pain. Please read all discharge instructions and return precautions.   Laceration Care, Adult A laceration is a cut or lesion that goes through all layers of the skin and into the tissue just beneath the skin. TREATMENT  Some lacerations may not require closure. Some lacerations may not be able to be closed due to an increased risk of infection. It is important to see your caregiver as soon as possible after an injury to minimize the risk of infection and maximize the opportunity for successful closure. If closure is appropriate, pain medicines may be given, if needed. The wound will be cleaned to help prevent infection. Your caregiver will use stitches (sutures), staples, wound glue (adhesive), or skin adhesive strips to repair the laceration. These tools bring the skin edges together to allow for faster healing and a better cosmetic outcome. However, all wounds will heal with a scar. Once the wound has healed, scarring can be minimized by covering the wound with sunscreen during the day for 1 full year. HOME CARE INSTRUCTIONS  For sutures or staples:  Keep the wound clean and dry.  If you were given a bandage (dressing), you should change it at least once a day. Also, change the dressing if it becomes wet or dirty, or as directed by your caregiver.  Wash the wound with soap and water 2 times a day. Rinse the wound off with water to remove all soap. Pat the wound dry with a clean towel.  After cleaning, apply a thin layer of the antibiotic ointment as recommended by your caregiver. This will help prevent infection and keep the dressing from sticking.  You may shower as usual after the first 24 hours. Do not soak the wound in water until the sutures are removed.  Only  take over-the-counter or prescription medicines for pain, discomfort, or fever as directed by your caregiver.  Get your sutures or staples removed as directed by your caregiver. For skin adhesive strips:  Keep the wound clean and dry.  Do not get the skin adhesive strips wet. You may bathe carefully, using caution to keep the wound dry.  If the wound gets wet, pat it dry with a clean towel.  Skin adhesive strips will fall off on their own. You may trim the strips as the wound heals. Do not remove skin adhesive strips that are still stuck to the wound. They will fall off in time. For wound adhesive:  You may briefly wet your wound in the shower or bath. Do not soak or scrub the wound. Do not swim. Avoid periods of heavy perspiration until the skin adhesive has fallen off on its own. After showering or bathing, gently pat the wound dry with a clean towel.  Do not apply liquid medicine, cream medicine, or ointment medicine to your wound while the skin adhesive is in place. This may loosen the film before your wound is healed.  If a dressing is placed over the wound, be careful not to apply tape directly over the skin adhesive. This may cause the adhesive to be pulled off before the wound is healed.  Avoid prolonged exposure to sunlight or tanning lamps while the skin adhesive is in place. Exposure to ultraviolet light in the first year will darken the scar.  The skin adhesive will usually remain in place for 5 to 10 days, then naturally fall off the skin. Do not pick at the adhesive film. You may need a tetanus shot if:  You cannot remember when you had your last tetanus shot.  You have never had a tetanus shot. If you get a tetanus shot, your arm may swell, get red, and feel warm to the touch. This is common and not a problem. If you need a tetanus shot and you choose not to have one, there is a rare chance of getting tetanus. Sickness from tetanus can be serious. SEEK MEDICAL CARE IF:    You have redness, swelling, or increasing pain in the wound.  You see a red line that goes away from the wound.  You have yellowish-white fluid (pus) coming from the wound.  You have a fever.  You notice a bad smell coming from the wound or dressing.  Your wound breaks open before or after sutures have been removed.  You notice something coming out of the wound such as wood or glass.  Your wound is on your hand or foot and you cannot move a finger or toe. SEEK IMMEDIATE MEDICAL CARE IF:   Your pain is not controlled with prescribed medicine.  You have severe swelling around the wound causing pain and numbness or a change in color in your arm, hand, leg, or foot.  Your wound splits open and starts bleeding.  You have worsening numbness, weakness, or loss of function of any joint around or beyond the wound.  You develop painful lumps near the wound or on the skin anywhere on your body. MAKE SURE YOU:   Understand these instructions.  Will watch your condition.  Will get help right away if you are not doing well or get worse. Document Released: 04/01/2005 Document Revised: 06/24/2011 Document Reviewed: 09/25/2010 [facility] Patient Information 2014 [place], [place].  Flank Pain Flank pain refers to pain that is located on the side of the body between the upper abdomen and the back. The pain may occur over a short period of time (acute) or may be long-term or reoccurring (chronic). It may be mild or severe. Flank pain can be caused by many things. CAUSES  Some of the more common causes of flank pain include:  Muscle strains.   Muscle spasms.   A disease of your spine (vertebral disk disease).   A lung infection (pneumonia).   Fluid around your lungs (pulmonary edema).   A kidney infection.   Kidney stones.   A very painful skin rash caused by the chickenpox virus (shingles).   Gallbladder disease.  HOME CARE INSTRUCTIONS  Home care will depend on the  cause of your pain. In general,  Rest as directed by your caregiver.  Drink enough fluids to keep your urine clear or pale yellow.  Only take over-the-counter or prescription medicines as directed by your caregiver. Some medicines may help relieve the pain.  Tell your caregiver about any changes in your pain.  Follow up with your caregiver as directed. SEEK IMMEDIATE MEDICAL CARE IF:   Your pain is not controlled with medicine.   You have new or worsening symptoms.  Your pain increases.   You have abdominal pain.   You have shortness of breath.   You have persistent nausea or vomiting.   You have swelling in your abdomen.   You feel faint or pass out.   You have blood in your urine.  You have  a fever or persistent symptoms for more than 2 3 days.  You have a fever and your symptoms suddenly get worse. MAKE SURE YOU:   Understand these instructions.  Will watch your condition.  Will get help right away if you are not doing well or get worse. Document Released: 05/23/2005 Document Revised: 12/25/2011 Document Reviewed: 11/14/2011 [facility] Patient Information 2014 [place].

## 2013-11-12 ENCOUNTER — Ambulatory Visit (INDEPENDENT_AMBULATORY_CARE_PROVIDER_SITE_OTHER): Payer: Self-pay | Admitting: Internal Medicine

## 2013-11-12 VITALS — BP 122/70 | HR 75 | Temp 98.8°F | Resp 16 | Ht 70.0 in | Wt 172.6 lb

## 2013-11-12 DIAGNOSIS — F411 Generalized anxiety disorder: Secondary | ICD-10-CM

## 2013-11-12 MED ORDER — HYDROXYZINE PAMOATE 25 MG PO CAPS: ORAL_CAPSULE | ORAL | Status: DC

## 2013-11-12 MED ORDER — SERTRALINE HCL 50 MG PO TABS: ORAL_TABLET | ORAL | Status: DC

## 2013-11-12 NOTE — Progress Notes (Signed)
° °  Subjective:  This chart was scribed for by Ashley JacobsBrittany Andrews, [facility] Scribe. The patient was seen in room and the patient's care was started at 8:46 AM.  Chief Complaint  Patient presents with   Medication Refill    wants a refill on all meds     Patient ID: Ronald [name], male    DOB: [birth year], 33 y.o.   MRN: [id number]  11/12/2013  Medication Refill   HPI HPI Comments: Ronald GuadalajaraRobert C Modi is a 33 y.o. male     Past Medical History  Diagnosis Date   Post traumatic stress disorder    Disorder of pineal gland    Anxiety    Arthritis    History reviewed. No pertinent past surgical history.  Allergies  Allergen Reactions   Alupent [Metaproterenol Sulfate]    Penicillins    Prior to Admission medications   Medication Sig Start Date End Date Taking? Authorizing Provider  acetaminophen (TYLENOL) 325 MG tablet Take 2 tablets (650 mg total) by mouth every 6 (six) hours as needed. 08/08/13  Yes Jennifer L Piepenbrink, PA-C  hydrOXYzine (VISTARIL) 25 MG capsule Take 25 mg by mouth every 6 (six) hours as needed for anxiety.   Yes Historical Provider, MD  ibuprofen (ADVIL,MOTRIN) 800 MG tablet Take 1 tablet (800 mg total) by mouth 3 (three) times daily. 11/08/12  Yes Fayrene HelperBowie Tran, PA-C  QUEtiapine Fumarate (SEROQUEL XR) 150 MG 24 hr tablet Take 150 mg by mouth at bedtime.   Yes Historical Provider, MD  sertraline (ZOLOFT) 50 MG tablet Take 50 mg by mouth daily.   Yes Historical Provider, MD  traZODone (DESYREL) 100 MG tablet Take 100 mg by mouth at bedtime as needed for sleep.   Yes Historical Provider, MD   History   Social History   Marital Status: Single    Spouse Name: N/A    Number of Children: N/A   Years of Education: N/A   Occupational History   Not on file.   Social History Main Topics   Smoking status: Never Smoker    Smokeless tobacco: Current User    Types: Chew   Alcohol Use: No   Drug Use: Yes    Special: Marijuana     Sexual Activity: Not on file   Other Topics Concern   Not on file   Social History Narrative   No narrative on file     Review of Systems         Objective:     Filed Vitals:   11/12/13 0828  BP: 122/70  Pulse: 75  Temp: 98.8 F (37.1 C)  TempSrc: Oral  Resp: 16  Height: 5\' 10"  (1.778 m)  Weight: 172 lb 9.6 oz (78.291 kg)  SpO2: 100%      Physical Exam       Assessment & Plan:  Care started by FNP Leone PayorGessner

## 2013-11-12 NOTE — Patient Instructions (Signed)
Please call Dr. Sherlynn StallsAiken's office and set up an appointment- [phone] [address]

## 2013-11-12 NOTE — Progress Notes (Signed)
   Subjective:    Patient ID: Foy GuadalajaraRobert C Haye, male    DOB: 05-17-1980, 33 y.o.   MRN: [id number]  HPI This is a pleasant 33 yo male who is brought in today by his mother. He has seen Dr. Merla Richesoolittle in the past. He  presents today for medication refill. He was discharged from [facility] about 2.5 months ago. He was there for substance abuse. He was taking vicodin and morphine that was prescribed for left hand/wrist pain. He denies taking drugs purchased on the street or using anything IV. His mother contradicts this, stating that he was buying drugs on the street. He states that he was not diagnosed as having a substance abuse disorder and that further treatment was not recommended.   He is currently using yoga/medication and has had decreased anxiety. Would like to have something for occasional anxiety attacks. Has used vistaril in the past. Had been on zoloft and seroquel, but reports concern about side effects with seroquel. He is willing to resume zoloft to help with underlying anxiety.   In speaking to his mother in private, she reports that the patient has been diagnosed with bipolar disorder but he hasn't been told this. According to her, he is only aware of his PTSD diagnosis.   Patient and his mother are considering moving out of state. Patient reports that he has realized his friends were not a good influence and he has been in a much better mental state since he has severed ties with his friends.    Review of Systems     Objective:   Physical Exam  Vitals reviewed. Constitutional: He is oriented to person, place, and time. He appears well-developed and well-nourished.  HENT:  Head: Normocephalic and atraumatic.  Eyes: Conjunctivae are normal.  Neck: Neck supple.  Cardiovascular: Normal rate.   Pulmonary/Chest: Effort normal.  Musculoskeletal: Normal range of motion.  Neurological: He is alert and oriented to person, place, and time.  Skin: Skin is warm and dry.  Psychiatric:  He has a normal mood and affect. His behavior is normal. Judgment and thought content normal.       Assessment & Plan:  1. Anxiety state, unspecified - hydrOXYzine (VISTARIL) 25 MG capsule; Take 1/2 to 1 tablet every 6 hours as needed for sleep or anxiety  Dispense: 30 capsule; Refill: 0 - sertraline (ZOLOFT) 50 MG tablet; Take 1/2 tablet every day for 1-2 weeks, can then increase to 1 tablet every day  Dispense: 30 tablet; Refill: 1 -encouraged patient to [facility] with Dr. Quintella ReichertAiken. Discussed importance of specialized care for his symptoms- patient appears agreeable.   -encouraged continued meditation and yoga.  Emi Belfasteborah B. Solash Tullo, FNP-BC  [facility]  11/12/2013 10:01 AM I have completed the patient encounter in its entirety as documented by FNP Leone PayorGessner, with editing by me where necessary. Zamire P. Merla Richesoolittle, M.D.

## 2015-03-13 ENCOUNTER — Encounter: Payer: Self-pay | Admitting: Internal Medicine

## 2015-09-22 DIAGNOSIS — F39 Unspecified mood [affective] disorder: Secondary | ICD-10-CM | POA: Insufficient documentation

## 2015-09-29 DIAGNOSIS — F302 Manic episode, severe with psychotic symptoms: Secondary | ICD-10-CM | POA: Insufficient documentation

## 2015-11-07 ENCOUNTER — Emergency Department (HOSPITAL_COMMUNITY): Payer: Self-pay

## 2015-11-07 ENCOUNTER — Encounter (HOSPITAL_COMMUNITY): Payer: Self-pay | Admitting: *Deleted

## 2015-11-07 ENCOUNTER — Emergency Department (HOSPITAL_COMMUNITY)
Admission: EM | Admit: 2015-11-07 | Discharge: 2015-11-07 | Disposition: A | Discharge status: Home / Self Care | Payer: Self-pay | Attending: Emergency Medicine | Admitting: Emergency Medicine

## 2015-11-07 DIAGNOSIS — M1990 Unspecified osteoarthritis, unspecified site: Secondary | ICD-10-CM | POA: Insufficient documentation

## 2015-11-07 DIAGNOSIS — Z79891 Long term (current) use of opiate analgesic: Secondary | ICD-10-CM | POA: Insufficient documentation

## 2015-11-07 DIAGNOSIS — Y929 Unspecified place or not applicable: Secondary | ICD-10-CM | POA: Insufficient documentation

## 2015-11-07 DIAGNOSIS — Z791 Long term (current) use of non-steroidal anti-inflammatories (NSAID): Secondary | ICD-10-CM | POA: Insufficient documentation

## 2015-11-07 DIAGNOSIS — M79671 Pain in right foot: Secondary | ICD-10-CM | POA: Insufficient documentation

## 2015-11-07 DIAGNOSIS — Y939 Activity, unspecified: Secondary | ICD-10-CM | POA: Insufficient documentation

## 2015-11-07 DIAGNOSIS — W010XXA Fall on same level from slipping, tripping and stumbling without subsequent striking against object, initial encounter: Secondary | ICD-10-CM | POA: Insufficient documentation

## 2015-11-07 DIAGNOSIS — Y999 Unspecified external cause status: Secondary | ICD-10-CM | POA: Insufficient documentation

## 2015-11-07 MED ORDER — IBUPROFEN 800 MG PO TABS: 800.0000 mg | ORAL_TABLET | Freq: Once | ORAL | Status: DC

## 2015-11-07 NOTE — ED Provider Notes (Signed)
[facility]-EMERGENCY DEPT Provider Note   CSN: [id number] Arrival date & time: 11/07/15  1901  First Provider Contact:  None    By signing my name below, I, [name], attest that this documentation has been prepared under the direction and in the presence of [name], [place].  Electronically Signed: [name], ED Scribe. 11/07/15. 8:04 PM.   History   Chief Complaint No chief complaint on file.   HPI Ronald [name] is a 35 y.o. male who presents to the Emergency Department complaining of sudden onset, constant, worsening, right ankle pain s/p ground level fall that occurred last night. Pt reports that he was in the shower when he slipped and fell, landing with all of his weight onto the left foot. No head injury or LOC. Pt fractured his right ankle on 10/10/2015 (approximately 1 month ago) and had reconstructive surgery in [place], [name]. He has been in a post op boot since then. He called his surgeon today and was told to come to the ED for further evaluation. Denies weakness, numbness, or any other associated symptoms.    The history is provided by the patient. No language interpreter was used.    Past Medical History:  Diagnosis Date  . Anxiety   . Arthritis   . Disorder of pineal gland   . Post traumatic stress disorder     Patient Active Problem List   Diagnosis Date Noted  . Disorder of pineal gland     No past surgical history on file.     Home Medications    Prior to Admission medications   Medication Sig Start Date End Date Taking? Authorizing Provider  acetaminophen (TYLENOL) 325 MG tablet Take 2 tablets (650 mg total) by mouth every 6 (six) hours as needed. 08/08/13   Francee Piccolo, PA-C  hydrOXYzine (VISTARIL) 25 MG capsule Take 1/2 to 1 tablet every 6 hours as needed for sleep or anxiety 11/12/13   Emi Belfast, FNP  ibuprofen (ADVIL,MOTRIN) 800 MG tablet Take 1 tablet (800 mg total) by mouth 3 (three) times daily. 11/08/12   Fayrene Helper,  PA-C  QUEtiapine Fumarate (SEROQUEL XR) 150 MG 24 hr tablet Take 150 mg by mouth at bedtime.    Historical Provider, MD  sertraline (ZOLOFT) 50 MG tablet Take 1/2 tablet every day for 1-2 weeks, can then increase to 1 tablet every day 11/12/13   Emi Belfast, FNP  traZODone (DESYREL) 100 MG tablet Take 100 mg by mouth at bedtime as needed for sleep.    Historical Provider, MD    Family History No family history on file.  Social History Social History  Substance Use Topics  . Smoking status: Never Smoker  . Smokeless tobacco: Current User    Types: Chew  . Alcohol use No     Allergies   Alupent [metaproterenol sulfate] and Penicillins   Review of Systems Review of Systems  Musculoskeletal: Positive for arthralgias.  Neurological: Negative for weakness and numbness.  All other systems reviewed and are negative.    Physical Exam Updated Vital Signs BP (!) 147/107 (BP Location: Right Arm)   Pulse 117   Temp 98 F (36.7 C) (Oral)   Resp 15   Ht 6' (1.829 m)   Wt 190 lb (86.2 kg)   SpO2 100%   BMI 25.77 kg/m   Physical Exam  Constitutional: He is oriented to person, place, and time. He appears well-developed and well-nourished. No distress.  HENT:  Head: Normocephalic and atraumatic.  Eyes: Conjunctivae and EOM are normal.  Neck: Neck supple. No tracheal deviation present.  Cardiovascular: Normal rate.   Pulmonary/Chest: Effort normal. No respiratory distress.  Musculoskeletal: Normal range of motion.  2+ DP Pulse on right Healing incision to right ankle with slight swelling NVI intact  Neurological: He is alert and oriented to person, place, and time.  Skin: Skin is warm and dry.  Psychiatric: He has a normal mood and affect. His behavior is normal.  Nursing note and vitals reviewed.    ED Treatments / Results   DIAGNOSTIC STUDIES: Oxygen Saturation is 100% on RA, normal by my interpretation.    COORDINATION OF CARE: 8:04 PM-Discussed treatment plan  which includes DG R Ankle with pt at bedside and pt agreed to plan.    Labs (all labs ordered are listed, but only abnormal results are displayed) Labs Reviewed - No data to display  EKG  EKG Interpretation None       Radiology No results found.  Procedures Procedures (including critical care time)  Medications Ordered in ED Medications - No data to display   Initial Impression / Assessment and Plan / ED Course  I have reviewed the triage vital signs and the nursing notes.  Pertinent labs & imaging results that were available during my care of the patient were reviewed by me and considered in my medical decision making (see chart for details).  Clinical Course     I personally performed the services in this documentation, which was scribed in my presence.  The recorded information has been reviewed and considered.   [name].   Final Clinical Impressions(s) / ED Diagnoses   Final diagnoses:  Foot pain, right   Schedule to see the Orthopaedist for recheck and further evaluation New Prescriptions New Prescriptions   No medications on file   I personally performed the services in this documentation, which was scribed in my presence.  The recorded information has been reviewed and considered.   [name].   [name], PA-C 11/07/15 2203    [name], MD 11/10/15 1248

## 2015-11-07 NOTE — ED Notes (Addendum)
Pt s/p surgery on his right foot for a fx to ankle on June 27th 2017. Pt slipped in the shower yesterday and now has severe pain in the right foot. Pt is wearing a cam boot. Pt states he phoned his surgeon in [place] who instructed him to be seen in the ER. Pt denies taking any medication for relief of pain. Foot is currently elevated on two pillows. (7:56pm)Pt stated the cam boot for now gives his foot some stability-Pt tolerated x-ray in dept. (8:30pm)9:55pm -Pt left w/o getting his discharge instructions and his medication. Pt did not sign . He had received a phone call from home and told the PA student he had to leave.

## 2015-11-09 ENCOUNTER — Ambulatory Visit (HOSPITAL_COMMUNITY): Payer: Self-pay | Admitting: Psychiatry

## 2015-12-08 ENCOUNTER — Ambulatory Visit (HOSPITAL_COMMUNITY): Payer: Self-pay | Admitting: Psychiatry

## 2015-12-20 ENCOUNTER — Telehealth (HOSPITAL_COMMUNITY): Payer: Self-pay

## 2015-12-22 ENCOUNTER — Ambulatory Visit (HOSPITAL_COMMUNITY): Payer: Self-pay | Admitting: Psychiatry

## 2016-04-18 ENCOUNTER — Encounter: Payer: Self-pay | Admitting: Family Medicine

## 2016-04-18 ENCOUNTER — Ambulatory Visit (INDEPENDENT_AMBULATORY_CARE_PROVIDER_SITE_OTHER): Payer: Self-pay | Admitting: Family Medicine

## 2016-04-18 VITALS — BP 150/100 | HR 95 | Temp 98.8°F | Resp 16 | Ht 70.0 in | Wt 209.4 lb

## 2016-04-18 DIAGNOSIS — F411 Generalized anxiety disorder: Secondary | ICD-10-CM

## 2016-04-18 MED ORDER — CLONAZEPAM 1 MG PO TABS: 0.5000 mg | ORAL_TABLET | Freq: Two times a day (BID) | ORAL | 2 refills | Status: DC

## 2016-04-18 MED ORDER — ARIPIPRAZOLE 15 MG PO TABS: 15.0000 mg | ORAL_TABLET | Freq: Every day | ORAL | 2 refills | Status: DC

## 2016-04-18 NOTE — Patient Instructions (Addendum)
     IF you received an x-ray today, you will receive an invoice from [facility]. Please contact [facility] at [phone] with questions or concerns regarding your invoice.   IF you received labwork today, you will receive an invoice from [facility]. Please contact [facility] at [phone] with questions or concerns regarding your invoice.   Our billing staff will not be able to assist you with questions regarding bills from these companies.  You will be contacted with the lab results as soon as they are available. The fastest way to get your results is to activate your My Chart account. Instructions are located on the last page of this paperwork. If you have not heard from us regarding the results in 2 weeks, please contact this office.     [facility] Policy for Prescribing Controlled Substances (Revised 02/2012) 1. Prescriptions for controlled substances will be filled by ONE provider at [facility] with whom you have established and developed a plan for your care, including follow-up. 2. You are encouraged to schedule an appointment with your prescriber at our appointment center for follow-up visits whenever possible. 3. If you request a prescription for the controlled substance while at [facility] for an acute problem (with someone other than your regular prescriber), you MAY be given a ONE-TIME prescription for a 30-day supply of the controlled substance, to allow time for you to return to see your regular prescriber for additional prescriptions.  

## 2016-04-18 NOTE — Progress Notes (Signed)
Subjective:    Patient ID: Ronald [name], male    DOB: December 13, 1980, 36 y.o.   MRN: [id number] Chief Complaint  Patient presents with  . Medication Refill    klonopin and abilify    HPI  Has been on the ability for 4-5 months and then the klonopin int times fsince 2001.  Ability dose has been stable sence he started.  Seroquel made him really slow and sedated.  Sertraline, trazodone - everything made him not feel like himself.   He was on pain medicine for his thumb and crushed his ankle in May - off of that.   He denies any h/o  He was initially started scheduled and back off Trying silent meditation.   Has gained some weight when he was  He used to be a runner of 2-5 miles every other day but now he is getting back to working out.  Depression screen PHQ 2/9 04/18/2016  Decreased Interest 0  Down, Depressed, Hopeless 0  PHQ - 2 Score 0   Past Medical History:  Diagnosis Date  . Anxiety   . Arthritis   . Disorder of pineal gland   . Post traumatic stress disorder    No past surgical history on file. Current Outpatient Prescriptions on File Prior to Visit  Medication Sig Dispense Refill  . acetaminophen (TYLENOL) 325 MG tablet Take 2 tablets (650 mg total) by mouth every 6 (six) hours as needed. 21 tablet 0  . ibuprofen (ADVIL,MOTRIN) 800 MG tablet Take 1 tablet (800 mg total) by mouth 3 (three) times daily. 21 tablet 0   No current facility-administered medications on file prior to visit.    Allergies  Allergen Reactions  . Alupent [Metaproterenol Sulfate]   . Penicillins    No family history on file. Social History   Social History  . Marital status: Single    Spouse name: N/A  . Number of children: N/A  . Years of education: N/A   Social History Main Topics  . Smoking status: Never Smoker  . Smokeless tobacco: Current User    Types: Chew  . Alcohol use No  . Drug use: No     Comment: one year ago  . Sexual activity: No   Other Topics Concern  .  None   Social History Narrative  . None    Review of Systems  See hpi    Objective:   Physical Exam  Constitutional: He is oriented to person, place, and time. He appears well-developed and well-nourished. No distress.  HENT:  Head: Normocephalic and atraumatic.  Eyes: Conjunctivae are normal. Pupils are equal, round, and reactive to light. No scleral icterus.  Neck: Normal range of motion. Neck supple. No thyromegaly present.  Cardiovascular: Normal rate, regular rhythm, normal heart sounds and intact distal pulses.   Pulmonary/Chest: Effort normal and breath sounds normal. No respiratory distress.  Musculoskeletal: He exhibits no edema.  Lymphadenopathy:    He has no cervical adenopathy.  Neurological: He is alert and oriented to person, place, and time.  Skin: Skin is warm and dry. He is not diaphoretic.  Psychiatric: He has a normal mood and affect. His behavior is normal.      BP (!) 143/96 (BP Location: Right Arm, Patient Position: Sitting, Cuff Size: Large)   Pulse 95   Temp 98.8 F (37.1 C) (Oral)   Resp 16   Ht 5\' 10"  (1.778 m)   Wt 209 lb 6.4 oz (95 kg)   SpO2 96%  BMI 30.05 kg/m      Assessment & Plan:   1. Anxiety state   Needs f/u OV for any additional refills. Pt and his mother both are adament that he uses the medication sparingly, not daily. There are notes in the chart alluding to a h/o opioid addiction but both pt and his mother deny this independently (I spoke to them individually in private) and NCCSD is negative. Refilled ability - doing well on it.  Meds ordered this encounter  Medications  . DISCONTD: clonazePAM (KLONOPIN) 1 MG tablet    Sig: Take 1 mg by mouth 2 (two) times daily.  Marland Kitchen. DISCONTD: ARIPiprazole (ABILIFY) 15 MG tablet    Sig: Take 15 mg by mouth daily.  . clonazePAM (KLONOPIN) 1 MG tablet    Sig: Take 0.5 tablets (0.5 mg total) by mouth 2 (two) times daily.    Dispense:  30 tablet    Refill:  2  . ARIPiprazole (ABILIFY) 15 MG  tablet    Sig: Take 1 tablet (15 mg total) by mouth daily.    Dispense:  30 tablet    Refill:  2    Norberto SorensonEva Shaw, M.D.  [facility]  [place] [address] [place], [place] [address] [phone] phone [phone] fax  05/17/16 2:00 AM

## 2016-07-10 ENCOUNTER — Ambulatory Visit (INDEPENDENT_AMBULATORY_CARE_PROVIDER_SITE_OTHER): Payer: Self-pay

## 2016-07-10 ENCOUNTER — Ambulatory Visit (INDEPENDENT_AMBULATORY_CARE_PROVIDER_SITE_OTHER): Payer: Self-pay | Admitting: Family Medicine

## 2016-07-10 VITALS — BP 144/93 | HR 86 | Temp 98.5°F | Resp 16 | Ht 70.0 in | Wt 204.0 lb

## 2016-07-10 DIAGNOSIS — R590 Localized enlarged lymph nodes: Secondary | ICD-10-CM

## 2016-07-10 DIAGNOSIS — J029 Acute pharyngitis, unspecified: Secondary | ICD-10-CM

## 2016-07-10 DIAGNOSIS — J069 Acute upper respiratory infection, unspecified: Secondary | ICD-10-CM

## 2016-07-10 DIAGNOSIS — J392 Other diseases of pharynx: Secondary | ICD-10-CM

## 2016-07-10 DIAGNOSIS — B9789 Other viral agents as the cause of diseases classified elsewhere: Secondary | ICD-10-CM

## 2016-07-10 LAB — POCT CBC
Granulocyte percent: 73.3 %G (ref 37–80)
HCT, POC: 46.1 % (ref 43.5–53.7)
Hemoglobin: 16.1 g/dL (ref 14.1–18.1)
Lymph, poc: 1.5 (ref 0.6–3.4)
MCH, POC: 31.1 pg (ref 27–31.2)
MCHC: 35 g/dL (ref 31.8–35.4)
MCV: 89 fL (ref 80–97)
MID (cbc): 0.4 (ref 0–0.9)
MPV: 8.4 fL (ref 0–99.8)
PLATELET COUNT, POC: 200 10*3/uL (ref 142–424)
POC Granulocyte: 5.2 (ref 2–6.9)
POC LYMPH PERCENT: 21.1 %L (ref 10–50)
POC MID %: 5.6 %M (ref 0–12)
RBC: 5.18 M/uL (ref 4.69–6.13)
RDW, POC: 13.5 %
WBC: 7.1 10*3/uL (ref 4.6–10.2)

## 2016-07-10 LAB — POCT INFLUENZA A/B
INFLUENZA B, POC: NEGATIVE
Influenza A, POC: NEGATIVE

## 2016-07-10 NOTE — Patient Instructions (Addendum)
  It was good to meet you today.  Everything looks great -- which is good news.  I think this is a reactive lymph node to a viral illness.  The swollen gland should continue to shrink over the next week or so.  If you're not feeling better in a week, come back to see us.   IF you received an x-ray today, you will receive an invoice from [facility]. Please contact [facility] at [phone] with questions or concerns regarding your invoice.   IF you received labwork today, you will receive an invoice from [facility]. Please contact [facility] at [phone] with questions or concerns regarding your invoice.   Our billing staff will not be able to assist you with questions regarding bills from these companies.  You will be contacted with the lab results as soon as they are available. The fastest way to get your results is to activate your My Chart account. Instructions are located on the last page of this paperwork. If you have not heard from us regarding the results in 2 weeks, please contact this office.

## 2016-07-10 NOTE — Progress Notes (Signed)
Ronald GuadalajaraRobert C Nghiem is a 36 y.o. male who [facility] today for swelling in throat:  1.  Swelling in throat:  Patient's symptoms started Saturday about 5 days ago. States he was asleep and had a "violent sneeze" which awoke him from sleep. He experienced immediate pain and swelling in right side of his neck. States that he had been doing well the day before without any sore throat or swelling. He is continued have pain on the right side of his neck. This is anterior portion of his neck. He has not had any actual sore throat. States that sometimes when he swallows this triggers the pain at the area of swelling in his throat. He is eating and drinking well.  He does have some fatigue, some subjective fevers and chills for same amount of time.  Question of URI symptoms.  States that the swelling in his throat has slowly improved since Saturday.  He has been taking ibuprofen with some pain relief.   ROS as above.    PMH reviewed. Patient is a nonsmoker.   Past Medical History:  Diagnosis Date  . Anxiety   . Arthritis   . Disorder of pineal gland   . Post traumatic stress disorder    No past surgical history on file.  Medications reviewed. Current Outpatient Prescriptions  Medication Sig Dispense Refill  . acetaminophen (TYLENOL) 325 MG tablet Take 2 tablets (650 mg total) by mouth every 6 (six) hours as needed. 21 tablet 0  . ARIPiprazole (ABILIFY) 15 MG tablet Take 1 tablet (15 mg total) by mouth daily. 30 tablet 2  . clonazePAM (KLONOPIN) 1 MG tablet Take 0.5 tablets (0.5 mg total) by mouth 2 (two) times daily. 30 tablet 2  . ibuprofen (ADVIL,MOTRIN) 800 MG tablet Take 1 tablet (800 mg total) by mouth 3 (three) times daily. 21 tablet 0   No current facility-administered medications for this visit.     Physical Exam:  BP (!) 144/93   Pulse 86   Temp 98.5 F (36.9 C) (Oral)   Resp 16   Ht 5\' 10"  (1.778 m)   Wt 204 lb (92.5 kg)   SpO2 97%   BMI 29.27 kg/m    Gen:  Patient sitting on exam table, appears stated age in no acute distress Head: Normocephalic atraumatic Eyes: EOMI, PERRL, sclera and conjunctiva non-erythematous Ears:  Canals clear bilaterally.  TMs pearly gray bilaterally without erythema or bulging.   Nose:  Some clear exudates BL Mouth: Mucosa membranes moist. Tonsils +2, nonenlarged, non-erythematous.   Neck: He has a 2 cm tender anterior cervical lymph node superior portion of SCM, located just anterior to SCM in the neck space.   Heart:  RRR, no murmurs auscultated. Pulm:  Clear to auscultation bilaterally with good air movement.  No wheezes or rales noted.   Abd:  Soft/NT/no organomegaly.  Neuro:  No focal deficits.   Results for orders placed or performed in visit on 07/10/16  POCT CBC  Result Value Ref Range   WBC 7.1 4.6 - 10.2 K/uL   Lymph, poc 1.5 0.6 - 3.4   POC LYMPH PERCENT 21.1 10 - 50 %L   MID (cbc) 0.4 0 - 0.9   POC MID % 5.6 0 - 12 %M   POC Granulocyte 5.2 2 - 6.9   Granulocyte percent 73.3 37 - 80 %G   RBC 5.18 4.69 - 6.13 M/uL   Hemoglobin 16.1 14.1 - 18.1 g/dL   HCT,  POC 46.1 43.5 - 53.7 %   MCV 89.0 80 - 97 fL   MCH, POC 31.1 27 - 31.2 pg   MCHC 35.0 31.8 - 35.4 g/dL   RDW, POC 16.1 %   Platelet Count, POC 200 142 - 424 K/uL   MPV 8.4 0 - 99.8 fL  POCT Influenza A/B  Result Value Ref Range   Influenza A, POC Negative Negative   Influenza B, POC Negative Negative    Assessment and Plan:  1.  Right neck cervical lymph node:  - likely reactive to viral URI, which is also likely cause of fatigue.   - improving.   - Mother present at visit today -- very concerned for mono.  Checking EBV today.   - nothing on soft tissue radiographs.  - normal WBC - negative flu - FU if no improvement in next week.

## 2016-07-12 LAB — EBV AB TO VIRAL CAPSID AG PNL, IGG+IGM
EBV VCA IgG: 127 U/mL — ABNORMAL HIGH (ref 0.0–17.9)
EBV VCA IgM: <36 U/mL (ref 0.0–35.9)

## 2016-08-05 ENCOUNTER — Other Ambulatory Visit: Payer: Self-pay | Admitting: Family Medicine

## 2016-08-13 ENCOUNTER — Other Ambulatory Visit: Payer: Self-pay | Admitting: Family Medicine

## 2016-08-13 MED ORDER — ARIPIPRAZOLE 15 MG PO TABS: 15.0000 mg | ORAL_TABLET | Freq: Every day | ORAL | 2 refills | Status: DC

## 2016-08-13 NOTE — Telephone Encounter (Signed)
Needs to be seen for any refills on controlled substances

## 2016-08-13 NOTE — Telephone Encounter (Signed)
Pharmacy advised  

## 2016-08-13 NOTE — Telephone Encounter (Signed)
PT CALLING FOR A REFILL ON KLONOPIN AND ABILIFY

## 2016-08-22 ENCOUNTER — Ambulatory Visit (INDEPENDENT_AMBULATORY_CARE_PROVIDER_SITE_OTHER): Payer: Self-pay | Admitting: Family Medicine

## 2016-08-22 ENCOUNTER — Encounter: Payer: Self-pay | Admitting: Family Medicine

## 2016-08-22 VITALS — BP 144/95 | HR 74 | Temp 98.1°F | Resp 18 | Ht 70.39 in | Wt 195.4 lb

## 2016-08-22 DIAGNOSIS — R030 Elevated blood-pressure reading, without diagnosis of hypertension: Secondary | ICD-10-CM

## 2016-08-22 DIAGNOSIS — F411 Generalized anxiety disorder: Secondary | ICD-10-CM

## 2016-08-22 DIAGNOSIS — H6993 Unspecified Eustachian tube disorder, bilateral: Secondary | ICD-10-CM

## 2016-08-22 MED ORDER — ARIPIPRAZOLE 15 MG PO TABS: 15.0000 mg | ORAL_TABLET | Freq: Every day | ORAL | 2 refills | Status: DC

## 2016-08-22 MED ORDER — ARIPIPRAZOLE 15 MG PO TABS: 15.0000 mg | ORAL_TABLET | Freq: Every day | ORAL | 1 refills | Status: DC

## 2016-08-22 MED ORDER — BUDESONIDE 32 MCG/ACT NA SUSP: 2.0000 | Freq: Every day | NASAL | 1 refills | Status: DC

## 2016-08-22 MED ORDER — CLONAZEPAM 1 MG PO TABS: 0.5000 mg | ORAL_TABLET | Freq: Two times a day (BID) | ORAL | 5 refills | Status: DC

## 2016-08-22 NOTE — Progress Notes (Signed)
Subjective:    Patient ID: Ronald [name], male    DOB: 1980-07-18, 36 y.o.   MRN: [id number] Chief Complaint  Patient presents with  . Medication Refill    abilify,clonazepam  . Ear cleaning    HPI  Ronald [name] is a 36 year old male who is here today for medication refills. First met him 4 months prior when I prescribed a 3 mo supply of klonopin 0.40m bid which he has filled about every 5 wks. He has been taking Klonopin intermittently since 2001. He was started on Abilify fall 2017 and has done very well on it. Likes current medication regimen and feels it is working well. Has failed numerous other psych medications prior. Seroquel made him really slow and sedated.  Sertraline, trazodone (and everything else he has tried) made him not feel like himself.  This month is worse with the two-year anniversary of his father's death so he has been recently using more Klonopin Still doing Breathing exercises and yoga daily. Using melatonin for sleep prn but overall sleeping well. Is going to the gym daily and has gotten up to 3 miles on the elliptical. Ankle is almost completely healed with pain resolved and he is going to be able to start jogging for exercise again seen.  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  Used to have to get wax flushed out of his ears every other year. He has noticed when he used the ear buds some decreased hearing as well as some popping sounds.  Depression screen [facility] [place] 08/22/2016 07/10/2016 04/18/2016  Decreased Interest 0 0 0  Down, Depressed, Hopeless 0 0 0  PHQ - 2 Score 0 0 0   Past Medical History:  Diagnosis Date  . Anxiety   . Arthritis   . Disorder of pineal gland   . Post traumatic stress disorder    History reviewed. No pertinent surgical history. Current Outpatient Prescriptions on File Prior to Visit    Medication Sig Dispense Refill  . acetaminophen (TYLENOL) 325 MG tablet Take 2 tablets (650 mg total) by mouth every 6 (six) hours as needed. 21 tablet 0  . ibuprofen (ADVIL,MOTRIN) 800 MG tablet Take 1 tablet (800 mg total) by mouth 3 (three) times daily. 21 tablet 0   No current facility-administered medications on file prior to visit.    Allergies  Allergen Reactions  . Penicillins    History reviewed. No pertinent family history. Social History   Social History  . Marital status: Single    Spouse name: N/A  . Number of children: N/A  . Years of education: N/A   Social History Main Topics  . Smoking status: Never Smoker  . Smokeless tobacco: Current User    Types: Chew  . Alcohol use No  . Drug use: No     Comment: one year ago  . Sexual activity: No   Other Topics Concern  . None   Social History Narrative  . None    Review of Systems See hpi    Objective:   Physical Exam  Constitutional: He is oriented to person, place, and time. He appears well-developed and well-nourished. No distress.  HENT:  Head: Normocephalic and atraumatic.  Right Ear: External ear and ear canal normal. Tympanic membrane is not injected, not erythematous and not retracted. A middle ear effusion is present.  Left Ear:  External ear and ear canal normal. Tympanic membrane is not injected, not erythematous and not retracted. A middle ear effusion is present.  Nose: Mucosal edema (and erythema) and rhinorrhea present.  Mouth/Throat: Uvula is midline and mucous membranes are normal. No oropharyngeal exudate, posterior oropharyngeal edema or posterior oropharyngeal erythema.  Eyes: Conjunctivae are normal. Right eye exhibits no discharge. Left eye exhibits no discharge. No scleral icterus.  Neck: Normal range of motion. Neck supple. No thyromegaly present.  Cardiovascular: Normal rate, regular rhythm, normal heart sounds and intact distal pulses.   Pulmonary/Chest: Effort normal and breath  sounds normal. No respiratory distress.  Lymphadenopathy:       Head (right side): No submandibular adenopathy present.       Head (left side): No submandibular adenopathy present.    He has no cervical adenopathy.       Right: No supraclavicular adenopathy present.       Left: No supraclavicular adenopathy present.  Neurological: He is alert and oriented to person, place, and time.  Skin: Skin is warm and dry. He is not diaphoretic. No erythema.  Psychiatric: He has a normal mood and affect. His behavior is normal.      BP (!) 144/95   Pulse 74   Temp 98.1 F (36.7 C) (Oral)   Resp 18   Ht 5' 10.39" (1.788 m)   Wt 195 lb 6.4 oz (88.6 kg)   SpO2 98%   BMI 27.72 kg/m      Assessment & Plan:  No health insurance so deferring labs.   1. Anxiety state - Doing well on current regimen. Using Klonopin when necessary. Follow-up in 6 months for any controlled substance refills.   2. Eustachian tube disorder, bilateral - advise starting a nasal steroid daily at bedtime. Could consider antihistamine. Sudafed would help but can't use due to blood pressure.   3.      Elevated blood pressure - suspect white coat but pt admits to stress with trading stocks and working out today so anxiety up a little. Agrees to check bp outside office sev times at pharm and call if >135/85 so we can start med and recheck in 1-2 mos.  Meds ordered this encounter  Medications  . clonazePAM (KLONOPIN) 1 MG tablet    Sig: Take 0.5 tablets (0.5 mg total) by mouth 2 (two) times daily.    Dispense:  30 tablet    Refill:  5  . ARIPiprazole (ABILIFY) 15 MG tablet    Sig: Take 1 tablet (15 mg total) by mouth daily.    Dispense:  90 tablet    Refill:  2  . budesonide (RHINOCORT AQUA) 32 MCG/ACT nasal spray    Sig: Place 2 sprays into both nostrils daily.    Dispense:  1 Bottle    Refill:  1     Ronald [name], M.D.  Primary Care at [facility] [address] [place], [place] [address] [phone]  phone [phone] fax  08/22/16 3:48 PM  Today I have utilized the [place] Controlled Substance Registry's online query to confirm compliance regarding the patient's narcotic pain medications. My review reveals appropriate prescription fills and that [facility] is the sole provider of these medications. Rechecks will occur regularly and the patient is aware of our use of the system.

## 2016-08-22 NOTE — Patient Instructions (Addendum)
Eustachian Tube Dysfunction The eustachian tube connects the middle ear to the back of the nose. It regulates air pressure in the middle ear by allowing air to move between the ear and nose. It also helps to drain fluid from the middle ear space. When the eustachian tube does not function properly, air pressure, fluid, or both can build up in the middle ear. Eustachian tube dysfunction can affect one or both ears. What are the causes? This condition happens when the eustachian tube becomes blocked or cannot open normally. This may result from:  Ear infections.  Colds and other upper respiratory infections.  Allergies.  Irritation, such as from cigarette smoke or acid from the stomach coming up into the esophagus (gastroesophageal reflux).  Sudden changes in air pressure, such as from descending in an airplane.  Abnormal growths in the nose or throat, such as nasal polyps, tumors, or enlarged tissue at the back of the throat (adenoids). What increases the risk? This condition may be more likely to develop in people who smoke and people who are overweight. Eustachian tube dysfunction may also be more likely to develop in children, especially children who have:  Certain birth defects of the mouth, such as cleft palate.  Large tonsils and adenoids. What are the signs or symptoms? Symptoms of this condition may include:  A feeling of fullness in the ear.  Ear pain.  Clicking or popping noises in the ear.  Ringing in the ear.  Hearing loss.  Loss of balance. Symptoms may get worse when the air pressure around you changes, such as when you travel to an area of high elevation or fly on an airplane. How is this diagnosed? This condition may be diagnosed based on:  Your symptoms.  A physical exam of your ear, nose, and throat.  Tests, such as those that measure:  The movement of your eardrum (tympanogram).  Your hearing (audiometry). How is this treated? Treatment depends on  the cause and severity of your condition. If your symptoms are mild, you may be able to relieve your symptoms by moving air into ("popping") your ears. If you have symptoms of fluid in your ears, treatment may include:  Decongestants.  Antihistamines.  Nasal sprays or ear drops that contain medicines that reduce swelling (steroids). In some cases, you may need to have a procedure to drain the fluid in your eardrum (myringotomy). In this procedure, a small tube is placed in the eardrum to:  Drain the fluid.  Restore the air in the middle ear space. Follow these instructions at home:  Take over-the-counter and prescription medicines only as told by your health care provider.  Use techniques to help pop your ears as recommended by your health care provider. These may include:  Chewing gum.  Yawning.  Frequent, forceful swallowing.  Closing your mouth, holding your nose closed, and gently blowing as if you are trying to blow air out of your nose.  Do not do any of the following until your health care provider approves:  Travel to high altitudes.  Fly in airplanes.  Work in a pressurized cabin or room.  Scuba dive.  Keep your ears dry. Dry your ears completely after showering or bathing.  Do not smoke.  Keep all follow-up visits as told by your health care provider. This is important. Contact a health care provider if:  Your symptoms do not go away after treatment.  Your symptoms come back after treatment.  You are unable to pop your ears.    You have:  A fever.  Pain in your ear.  Pain in your head or neck.  Fluid draining from your ear.  Your hearing suddenly changes.  You become very dizzy.  You lose your balance. This information is not intended to replace advice given to you by your health care provider. Make sure you discuss any questions you have with your health care provider. Document Released: 04/28/2015 Document Revised: 09/07/2015 Document  Reviewed: 04/20/2014 Elsevier Interactive Patient Education  2017 [employer]    IF you received an x-ray today, you will receive an invoice from [facility]. Please contact [facility] at [phone] with questions or concerns regarding your invoice.   IF you received labwork today, you will receive an invoice from [facility]. Please contact [facility] at [phone] with questions or concerns regarding your invoice.   Our billing staff will not be able to assist you with questions regarding bills from these companies.  You will be contacted with the lab results as soon as they are available. The fastest way to get your results is to activate your My Chart account. Instructions are located on the last page of this paperwork. If you have not heard from us regarding the results in 2 weeks, please contact this office.    Living With Anxiety After being diagnosed with an anxiety disorder, you may be relieved to know why you have felt or behaved a certain way. It is natural to also feel overwhelmed about the treatment ahead and what it will mean for your life. With care and support, you can manage this condition and recover from it. How to cope with anxiety Dealing with stress  Stress is your body's reaction to life changes and events, both good and bad. Stress can last just a few hours or it can be ongoing. Stress can play a major role in anxiety, so it is important to learn both how to cope with stress and how to think about it differently. Talk with your health care provider or a counselor to learn more about stress reduction. He or she may suggest some stress reduction techniques, such as:  Music therapy. This can include creating or listening to music that you enjoy and that inspires you.  Mindfulness-based meditation. This involves being aware of your normal breaths, rather than trying to control your breathing. It can be done while sitting or walking.  Centering prayer.  This is a kind of meditation that involves focusing on a word, phrase, or sacred image that is meaningful to you and that brings you peace.  Deep breathing. To do this, expand your stomach and inhale slowly through your nose. Hold your breath for 3-5 seconds. Then exhale slowly, allowing your stomach muscles to relax.  Self-talk. This is a skill where you identify thought patterns that lead to anxiety reactions and correct those thoughts.  Muscle relaxation. This involves tensing muscles then relaxing them. Choose a stress reduction technique that fits your lifestyle and personality. Stress reduction techniques take time and practice. Set aside 5-15 minutes a day to do them. Therapists can offer training in these techniques. The training may be covered by some insurance plans. Other things you can do to manage stress include:  Keeping a stress diary. This can help you learn what triggers your stress and ways to control your response.  Thinking about how you respond to certain situations. You may not be able to control everything, but you can control your reaction.  Making time for activities that help  you relax, and not feeling guilty about spending your time in this way. Therapy combined with coping and stress-reduction skills provides the best chance for successful treatment. Medicines  Medicines can help ease symptoms. Medicines for anxiety include:  Anti-anxiety drugs.  Antidepressants.  Beta-blockers. Medicines may be used as the main treatment for anxiety disorder, along with therapy, or if other treatments are not working. Medicines should be prescribed by a health care provider. Relationships  Relationships can play a big part in helping you recover. Try to spend more time connecting with trusted friends and family members. Consider going to couples counseling, taking family education classes, or going to family therapy. Therapy can help you and others better understand the  condition. How to recognize changes in your condition Everyone has a different response to treatment for anxiety. Recovery from anxiety happens when symptoms decrease and stop interfering with your daily activities at home or work. This may mean that you will start to:  Have better concentration and focus.  Sleep better.  Be less irritable.  Have more energy.  Have improved memory. It is important to recognize when your condition is getting worse. Contact your health care provider if your symptoms interfere with home or work and you do not feel like your condition is improving. Where to find help and support: You can get help and support from these sources:  Self-help groups.  Online and [employer].  A trusted spiritual leader.  Couples counseling.  Family education classes.  Family therapy. Follow these instructions at home:  Eat a healthy diet that includes plenty of vegetables, fruits, whole grains, low-fat dairy products, and lean protein. Do not eat a lot of foods that are high in solid fats, added sugars, or salt.  Exercise. Most adults should do the following:  Exercise for at least 150 minutes each week. The exercise should increase your heart rate and make you sweat (moderate-intensity exercise).  Strengthening exercises at least twice a week.  Cut down on caffeine, tobacco, alcohol, and other potentially harmful substances.  Get the right amount and quality of sleep. Most adults need 7-9 hours of sleep each night.  Make choices that simplify your life.  Take over-the-counter and prescription medicines only as told by your health care provider.  Avoid caffeine, alcohol, and certain over-the-counter cold medicines. These may make you feel worse. Ask your pharmacist which medicines to avoid.  Keep all follow-up visits as told by your health care provider. This is important. Questions to ask your health care provider  Would I benefit from  therapy?  How often should I follow up with a health care provider?  How long do I need to take medicine?  Are there any long-term side effects of my medicine?  Are there any alternatives to taking medicine? Contact a health care provider if:  You have a hard time staying focused or finishing daily tasks.  You spend many hours a day feeling worried about everyday life.  You become exhausted by worry.  You start to have headaches, feel tense, or have nausea.  You urinate more than normal.  You have diarrhea. Get help right away if:  You have a racing heart and shortness of breath.  You have thoughts of hurting yourself or others. If you ever feel like you may hurt yourself or others, or have thoughts about taking your own life, get help right away. You can go to your nearest emergency department or call:  Your local emergency services (911 in  the [place]  A suicide crisis helpline, such as the National Suicide Prevention Lifeline at 4371530832. This is open 24-hours a day. Summary  Taking steps to deal with stress can help calm you.  Medicines cannot cure anxiety disorders, but they can help ease symptoms.  Family, friends, and partners can play a big part in helping you recover from an anxiety disorder. This information is not intended to replace advice given to you by your health care provider. Make sure you discuss any questions you have with your health care provider. Document Released: 03/26/2016 Document Revised: 03/26/2016 Document Reviewed: 03/26/2016 Elsevier Interactive Patient Education  2017 [employer].

## 2016-11-25 ENCOUNTER — Encounter (HOSPITAL_COMMUNITY): Payer: Self-pay

## 2016-11-25 ENCOUNTER — Emergency Department (HOSPITAL_COMMUNITY)
Admission: EM | Admit: 2016-11-25 | Discharge: 2016-11-25 | Disposition: A | Discharge status: Home / Self Care | Payer: Self-pay | Attending: Emergency Medicine | Admitting: Emergency Medicine

## 2016-11-25 ENCOUNTER — Ambulatory Visit (HOSPITAL_COMMUNITY)
Admission: RE | Admit: 2016-11-25 | Discharge: 2016-11-25 | Disposition: A | Discharge status: Home / Self Care | Payer: Self-pay | Source: Ambulatory Visit | Attending: Emergency Medicine | Admitting: Emergency Medicine

## 2016-11-25 DIAGNOSIS — M79604 Pain in right leg: Secondary | ICD-10-CM | POA: Insufficient documentation

## 2016-11-25 DIAGNOSIS — M25571 Pain in right ankle and joints of right foot: Secondary | ICD-10-CM | POA: Insufficient documentation

## 2016-11-25 DIAGNOSIS — Z5321 Procedure and treatment not carried out due to patient leaving prior to being seen by health care provider: Secondary | ICD-10-CM | POA: Insufficient documentation

## 2016-11-25 DIAGNOSIS — Z9889 Other specified postprocedural states: Secondary | ICD-10-CM | POA: Insufficient documentation

## 2016-11-25 NOTE — ED Notes (Signed)
No answer when called for triage x 3.  

## 2016-11-25 NOTE — ED Triage Notes (Signed)
Pt states that he was running on the treadmill and elipical earlier today, and about an hour ago began having  Sharp pain in his anterior ankle 3/10 , pt states that he had ankle reconstruction  on ankle 09/2015. Pt states that he wants to make sure rods did not come out of place.

## 2016-11-25 NOTE — ED Triage Notes (Signed)
Pt states that he had right ankle reconstruction 09/2015 and was cleared to begin rehabilitation. Pt states that he was running on a treadmill/eliptical, and now states that he has been having 4/10 pain and states that he wants to make sure his rods did not come out of place.

## 2016-11-26 ENCOUNTER — Emergency Department (HOSPITAL_COMMUNITY)
Admission: EM | Admit: 2016-11-26 | Discharge: 2016-11-26 | Disposition: A | Discharge status: Home / Self Care | Payer: Self-pay | Attending: Emergency Medicine | Admitting: Emergency Medicine

## 2016-11-26 ENCOUNTER — Ambulatory Visit (HOSPITAL_COMMUNITY): Payer: Self-pay

## 2016-11-26 NOTE — ED Notes (Signed)
Pt left because they have been waiting a very long time and will call or come back tomorrow to find out results from xray.  The radiologist told the pt the ankle was fine so they are fine with knowing that information.

## 2016-12-11 ENCOUNTER — Telehealth: Payer: Self-pay | Admitting: Family Medicine

## 2016-12-11 NOTE — Telephone Encounter (Signed)
Pt is having a episode and the mother is wanting to know if Dr. Clelia Croft can help him with out having him committed or does patient mother need to go ahead to committed   Best number [phone]

## 2016-12-11 NOTE — Telephone Encounter (Signed)
Pt's mother, Ronald [name], came by wanting to know if Dr. Clelia CroftShaw could help have her son, Ronald MaduroRobert, involuntarily committed.  I told her Dr. Clelia CroftShaw would have to see him and Ronald Hatchnn says she can not get him to come in.  She says she is going to go talk to the magistrate and do it herself. [phone]

## 2016-12-15 DIAGNOSIS — F411 Generalized anxiety disorder: Secondary | ICD-10-CM | POA: Insufficient documentation

## 2016-12-15 DIAGNOSIS — F4310 Post-traumatic stress disorder, unspecified: Secondary | ICD-10-CM | POA: Insufficient documentation

## 2016-12-23 ENCOUNTER — Ambulatory Visit (INDEPENDENT_AMBULATORY_CARE_PROVIDER_SITE_OTHER): Payer: Self-pay | Admitting: Family Medicine

## 2016-12-23 ENCOUNTER — Encounter: Payer: Self-pay | Admitting: Family Medicine

## 2016-12-23 VITALS — BP 166/78 | HR 62 | Temp 98.5°F | Resp 18 | Ht 72.0 in | Wt 196.0 lb

## 2016-12-23 DIAGNOSIS — F17220 Nicotine dependence, chewing tobacco, uncomplicated: Secondary | ICD-10-CM

## 2016-12-23 DIAGNOSIS — F39 Unspecified mood [affective] disorder: Secondary | ICD-10-CM

## 2016-12-23 DIAGNOSIS — F4310 Post-traumatic stress disorder, unspecified: Secondary | ICD-10-CM

## 2016-12-23 DIAGNOSIS — F200 Paranoid schizophrenia: Secondary | ICD-10-CM

## 2016-12-23 DIAGNOSIS — F3174 Bipolar disorder, in full remission, most recent episode manic: Secondary | ICD-10-CM

## 2016-12-23 MED ORDER — 5-HTP 50 MG PO CAPS: 1.0000 | ORAL_CAPSULE | Freq: Every day | ORAL | Status: DC

## 2016-12-23 MED ORDER — MELATONIN 10 MG PO CAPS: 10.0000 mg | ORAL_CAPSULE | Freq: Every day | ORAL | Status: DC

## 2016-12-23 MED ORDER — UNABLE TO FIND: Status: DC

## 2016-12-23 NOTE — Patient Instructions (Addendum)
IF you received an x-ray today, you will receive an invoice from [facility]. Please contact [facility] at [phone] with questions or concerns regarding your invoice.   IF you received labwork today, you will receive an invoice from [facility]. Please contact [facility] at [phone] with questions or concerns regarding your invoice.   Our billing staff will not be able to assist you with questions regarding bills from these companies.  You will be contacted with the lab results as soon as they are available. The fastest way to get your results is to activate your My Chart account. Instructions are located on the last page of this paperwork. If you have not heard from us regarding the results in 2 weeks, please contact this office.      What You Need to Know About Smokeless Tobacco Use Tobacco use is one of the leading causes of cancer and other chronic health problems. Smokeless tobacco is tobacco that is put directly into the mouth instead of being smoked. It may also be called chewing tobacco or snuff. Smokeless tobacco is made from the leaves of tobacco plants and it comes in several forms:  Loose, dry leaves, plugs, or twists.  Moist pouches.  Dissolving lozenges or strips.  Chewing, sucking, or holding the tobacco in your mouth causes your mouth to make more saliva. The saliva mixes with the tobacco to make "tobacco juice" that is swallowed or spit out. How can smokeless tobacco affect me? Using smokeless tobacco:  Increases your risk of developing cancer. Smokeless tobacco contains at least 28 different types of cancer-causing chemicals (carcinogens).  Increases your chances of developing other long-term health problems, including high blood pressure, heart disease, stroke, and dental problems.  Can make you become addicted. Nicotine is one of the chemicals in tobacco. When you chew tobacco, you absorb nicotine from the tobacco juice. This can make you  feel more alert than usual.  Can cause problems with pregnancy. Pregnant women who use smokeless tobacco are more likely to miscarry or deliver a baby too early (premature delivery).  Can affect the appearance and health of your mouth. Using smokeless tobacco may cause bad breath, yellow-brown teeth, mouth sores, cracking and bleeding lips, gum recession, and lesions on the soft tissues of your mouth (leukoplakia).  What are the benefits of not using smokeless tobacco? The benefits of not using smokeless tobacco include:  A healthy mind because: ? You avoid addiction.  A healthy body because: ? You avoid dental problems. ? You promote healthy pregnancy. ? You avoid long-term health problems.  A healthy wallet because: ? You avoid costs of buying tobacco. ? You avoid health care costs in the future.  A healthy family because: ? You avoid accidental poisoning of children in your household.  What can happen if I continue to use smokeless tobacco? If you continue to use smokeless tobacco, you will increase your risk for developing certain cancers. These include:  Tongue.  Lips, mouth, and gums.  Throat (esophagus) and voice box (larynx).  Stomach.  Pancreas.  Bladder.  Colon.  Long-term use of smokeless tobacco can also lead to:  High blood pressure, heart disease, and stroke.  Gum disease, gum recession, and bone loss around the teeth.  Tooth decay.  How do I quit using smokeless tobacco? Quitting the use of smokeless tobacco can be hard, but it can be done. Follow these steps:  Pick a date to quit. Set a date within the next two weeks. This  gives you time to prepare.  Write down the reasons why you are quitting. Keep this list in places where you will see it often, such as on your bathroom mirror or in your car or wallet.  Identify the people, places, things, and activities that make you want to use tobacco (triggers) and avoid them.  Get rid of any tobacco you  have and remove any tobacco smells. To do this: ? Throw away all containers of tobacco at home, at work, and in your car. ? Throw away any other items that you use regularly when you chew tobacco. ? Clean your car and make sure to remove all tobacco-related items. ? Clean your home, including curtains and carpets.  Tell your family, friends, and coworkers that you are quitting. This can make quitting easier.  Ask your health care provider for help quitting smokeless tobacco. This may involve treatment. Find out what treatment options are covered by your health insurance.  Keep track of how many days have passed since you quit. Remembering how long and hard you have worked to quit can help you avoid using tobacco again.  Where can I get support? Ask your health care provider if there is a local support group for quitting smokeless tobacco. Where can I get more information? You can learn more about the risks of using smokeless tobacco and the benefits of quitting from these sources:  National Cancer Institute: www.cancer.gov  American Cancer Society: www.cancer.org  When should I seek medical care? Seek medical care if you have:  White or other discolored patches in your mouth.  Difficulty swallowing.  A change in your voice.  Unexplained weight loss.  Stomach pain, nausea, or vomiting.  Summary  Smokeless tobacco contains at least 28 different chemicals that are known to cause cancer (carcinogen).  Nicotine is an addictive chemical in smokeless tobacco.  When you quit using smokeless tobacco, you lower your risk of developing cancer. This information is not intended to replace advice given to you by your health care provider. Make sure you discuss any questions you have with your health care provider. Document Released: 09/03/2010 Document Revised: 11/25/2015 Document Reviewed: 11/11/2014 Elsevier Interactive Patient Education  2017 [employer].

## 2016-12-23 NOTE — Progress Notes (Signed)
Subjective:  By signing my name below, I, Stann Oresung-Kai Tsai, attest that this documentation has been prepared under the direction and in the presence of Norberto SorensonEva Azaria Stegman, MD. Electronically Signed: Stann Oresung-Kai Tsai, Scribe. 12/23/2016 , 4:01 PM .  Patient was seen in Room 2 .   Patient ID: Ronald GuadalajaraRobert C [name], male    DOB: [birth year], 36 y.o.   MRN: [id number] Chief Complaint  Patient presents with  . Medication Management  . Follow-up   HPI Ronald [name] is a 36 y.o. male who [facility] for follow up and medication management.   Patient was hospitalized due to paranoid schizophrenia from 9/1 to 9/5. Reportedly on 9/4, his mother requested long term hospitalization as he was impulsive, manic and non-compliant with his medications. Upon discharge, he was told to follow up on follow day (9/6) with psychiatry at [facility]; though he did not have an appointment. He was under IVC.   Note on 9/4, reports that he was agitated and irritable when he wasn't given klonopin. Patient had called his mother, who called the inpatient unit and also informed that she wanted him to resume his prior klonopin. His mother continued to call phone with repetitious request for the klonopin, which had already been addressed. Next day, patient was discharged home with his mother. During the entire hospitalization, both patient and mother wanted him to be transferred to another facility. On the day of discharge, noted less anxious though symptoms did not resolve. He denied any suicidal ideation which he had prior to his admission, as well as no longer hallucinating. Prior to admission, he thought people were going to shoot him through his window, and thought people were going to kill him. From the beginning of admission, notes of his mother being loud and abusive, and also demanding towards the staff. Upon discharge, he was on Abilify 15mg , klonopin 0.5mg  BID, vistaril 50mg  q6h prn, which he was taking  twice a day, ibuprofen 600mg  for pain and trazodone 15mg  qhs.   His labs were done with normal BMP, A1C, lipid and TSH on 12/15/16. On discharge, he maintained the same medications except for vistaril. [facility] does not prescribe klonopin.   Today Patient reports feeling wired on vistaril, as well as increased heart rate with palpitations. [facility]. He requested klonopin, but informed only a day.   [facility]'s [facility] with an appointment. He's been there before, when his brother was killed. He drank an energy drink prior to coming into office today.   Medications Trazodone- working well, sleep is good Abilify- tried to get off of it, but right now isn't a good time; his dad's settlement has been weighing on his mind Ibuprofen- 800mg  at a time, but not everyday, few times a week.  Klonopin- taken 3/4 tablet today 5 htp - states taking 15mg  for sleep Melatonin- 9mg  for sleep c60- carbon 60 molecules (from soot) helped his leg heal cbd oil- relief with pain  He states his stomach is doing better. He's been improvement with his weight, and able to run 2 miles now. He denies urinary symptoms.   Tobacco abuse He dips tobacco, about 4 pouches a day. He's trying to cut back to 2 pouches, and eventually none. He denies any sinus congestion.   His mother smokes a lot, and he wakes up to heavy tobacco smell.   Wrist Pain + abrasion He mentions bilateral wrist pain from the handcuffs when he  was detained. He also felt bruising over his neck. He has an abrasion over his right wrist from the handcuffs being too tight. He is right hand dominant. He notes being strapped down, even over his neck, "I thought they were actually trying to kill me".   Work He's currently self employed, trading in Teacher, early years/pre. This has been occupying his time with PTSD.   Past Medical History:  Diagnosis Date  . Anxiety   . Arthritis   . Disorder of pineal gland     . Post traumatic stress disorder    Prior to Admission medications   Medication Sig Start Date End Date Taking? Authorizing Provider  acetaminophen (TYLENOL) 325 MG tablet Take 2 tablets (650 mg total) by mouth every 6 (six) hours as needed. 08/08/13   Piepenbrink, Victorino Dike, PA-C  ARIPiprazole (ABILIFY) 15 MG tablet Take 1 tablet (15 mg total) by mouth daily. 08/22/16   Sherren Mocha, MD  budesonide (RHINOCORT AQUA) 32 MCG/ACT nasal spray Place 2 sprays into both nostrils daily. 08/22/16   Sherren Mocha, MD  clonazePAM (KLONOPIN) 1 MG tablet Take 0.5 tablets (0.5 mg total) by mouth 2 (two) times daily. 08/22/16   Sherren Mocha, MD  ibuprofen (ADVIL,MOTRIN) 800 MG tablet Take 1 tablet (800 mg total) by mouth 3 (three) times daily. 11/08/12   Fayrene Helper, PA-C   Allergies  Allergen Reactions  . Penicillins    Review of Systems  Constitutional: Negative for fatigue and unexpected weight change.  Eyes: Negative for visual disturbance.  Respiratory: Negative for cough, chest tightness and shortness of breath.   Cardiovascular: Negative for chest pain, palpitations and leg swelling.  Gastrointestinal: Negative for abdominal pain and blood in stool.  Neurological: Negative for dizziness, light-headedness and headaches.       Objective:   Physical Exam  Constitutional: He is oriented to person, place, and time. He appears well-developed and well-nourished. No distress.  HENT:  Head: Normocephalic and atraumatic.  Eyes: Pupils are equal, round, and reactive to light. EOM are normal.  Neck: Neck supple.  Cardiovascular: Normal rate, regular rhythm, S1 normal, S2 normal and normal heart sounds.   No murmur heard. Pulmonary/Chest: Effort normal and breath sounds normal. No respiratory distress. He has no wheezes.  Musculoskeletal: Normal range of motion.  Neurological: He is alert and oriented to person, place, and time.  Skin: Skin is warm and dry.  Psychiatric: He has a normal mood and affect. His  behavior is normal.  Nursing note and vitals reviewed.   BP (!) 166/78   Pulse 62   Temp 98.5 F (36.9 C) (Oral)   Resp 18   Ht 6' (1.829 m)   Wt 196 lb (88.9 kg)   SpO2 98%   BMI 26.58 kg/m    [4:00PM] BP recheck in room, left arm (manual): 126/84     Assessment & Plan:   1. Mood disorder (HCC)   2. PTSD (post-traumatic stress disorder)   3. Bipolar 1 disorder, manic, full remission (HCC)   4. Schizophrenia, paranoid type (HCC)   5. Chewing tobacco nicotine dependence without complication     Meds ordered this encounter  Medications  . traZODone (DESYREL) 50 MG tablet    Sig: Take 50 mg by mouth at bedtime.  . Melatonin 10 MG CAPS    Sig: Take 10 mg by mouth at bedtime.  Marland Kitchen 5-Hydroxytryptophan (5-HTP) 50 MG CAPS    Sig: Take 1 tablet by mouth at bedtime.  Dispense:  30 each  . UNABLE TO FIND    Sig: Med Name: CBD OIL  . UNABLE TO FIND    Sig: Med Name: C-60 (Carbon supplement for healing)    I personally performed the services described in this documentation, which was scribed in my presence. The recorded information has been reviewed and considered, and addended by me as needed.   Norberto Sorenson, M.D.  Primary Care at [facility] [address] [place], [name] [address] [phone] phone [phone] fax  12/26/16 2:06 PM

## 2017-03-03 ENCOUNTER — Ambulatory Visit: Payer: Self-pay | Admitting: Family Medicine

## 2017-03-20 ENCOUNTER — Other Ambulatory Visit: Payer: Self-pay

## 2017-03-20 ENCOUNTER — Encounter: Payer: Self-pay | Admitting: Family Medicine

## 2017-03-20 ENCOUNTER — Ambulatory Visit (INDEPENDENT_AMBULATORY_CARE_PROVIDER_SITE_OTHER): Payer: Self-pay | Admitting: Family Medicine

## 2017-03-20 VITALS — BP 108/66 | HR 74 | Temp 97.7°F | Resp 16 | Ht 72.0 in | Wt 203.8 lb

## 2017-03-20 DIAGNOSIS — F17220 Nicotine dependence, chewing tobacco, uncomplicated: Secondary | ICD-10-CM

## 2017-03-20 DIAGNOSIS — F3174 Bipolar disorder, in full remission, most recent episode manic: Secondary | ICD-10-CM

## 2017-03-20 DIAGNOSIS — F39 Unspecified mood [affective] disorder: Secondary | ICD-10-CM

## 2017-03-20 DIAGNOSIS — F4310 Post-traumatic stress disorder, unspecified: Secondary | ICD-10-CM

## 2017-03-20 DIAGNOSIS — F200 Paranoid schizophrenia: Secondary | ICD-10-CM

## 2017-03-20 MED ORDER — CLONAZEPAM 1 MG PO TABS: 0.5000 mg | ORAL_TABLET | Freq: Two times a day (BID) | ORAL | 5 refills | Status: DC | PRN

## 2017-03-20 MED ORDER — ARIPIPRAZOLE 15 MG PO TABS: 15.0000 mg | ORAL_TABLET | Freq: Every day | ORAL | 3 refills | Status: DC

## 2017-03-20 MED ORDER — TRAZODONE HCL 50 MG PO TABS: 50.0000 mg | ORAL_TABLET | Freq: Every evening | ORAL | 5 refills | Status: DC | PRN

## 2017-03-20 NOTE — Patient Instructions (Addendum)
     IF you received an x-ray today, you will receive an invoice from [facility]. Please contact [facility] at [phone] with questions or concerns regarding your invoice.   IF you received labwork today, you will receive an invoice from [facility]. Please contact [facility] at [phone] with questions or concerns regarding your invoice.   Our billing staff will not be able to assist you with questions regarding bills from these companies.  You will be contacted with the lab results as soon as they are available. The fastest way to get your results is to activate your My Chart account. Instructions are located on the last page of this paperwork. If you have not heard from us regarding the results in 2 weeks, please contact this office.     [facility] Policy for Prescribing Controlled Substances (Revised 02/2012) 1. Prescriptions for controlled substances will be filled by ONE provider at [facility] with whom you have established and developed a plan for your care, including follow-up. 2. You are encouraged to schedule an appointment with your prescriber at our appointment center for follow-up visits whenever possible. 3. If you request a prescription for the controlled substance while at [facility] for an acute problem (with someone other than your regular prescriber), you MAY be given a ONE-TIME prescription for a 30-day supply of the controlled substance, to allow time for you to return to see your regular prescriber for additional prescriptions.       4.  Call at MINIMUM 72 hours in advance for refills on your medication.  You may be required to schedule an appointment for a refill if you have already received your allotted amount or there are other concerns.       5.  You will need to be seen every 3 months for refills at minimum. If you ever feel like you need a change in your dose or the administration, you will need to be seen in an office visit.       6.  You need to use the same  pharmacy every month and you cannot get any controlled substances from any other clinic.

## 2017-03-20 NOTE — Progress Notes (Signed)
Subjective:    Patient was seen in Room 2 .   Patient ID: Ronald [name], male    DOB: 03-01-1981, 36 y.o.   MRN: [id number] Chief Complaint  Patient presents with  . Medication Refill    desyrel, klonopin, abilify    Medication Refill  Pertinent negatives include no abdominal pain, chest pain, coughing, fatigue or headaches.   Ronald [name] is a 36 y.o. male who presents to Primary Care at [facility] for follow up and medication management.   Patient was hospitalized due to paranoid schizophrenia from 9/1 to 9/5. Reportedly on 9/4, his mother requested long term hospitalization as he was impulsive, manic and non-compliant with his medications. Upon discharge, he was told to follow up on follow day (9/6) with psychiatry at [facility]; though he did not have an appointment. He was under IVC.   Note on 9/4, reports that he was agitated and irritable when he wasn't given klonopin. Patient had called his mother, who called the inpatient unit and also informed that she wanted him to resume his prior klonopin. His mother continued to call phone with repetitious request for the klonopin, which had already been addressed. Next day, patient was discharged home with his mother. During the entire hospitalization, both patient and mother wanted him to be transferred to another facility. On the day of discharge, noted less anxious though symptoms did not resolve. He denied any suicidal ideation which he had prior to his admission, as well as no longer hallucinating. Prior to admission, he thought people were going to shoot him through his window, and thought people were going to kill him. From the beginning of admission, notes of his mother being loud and abusive, and also demanding towards the staff. Upon discharge, he was on Abilify 15mg , klonopin 0.5mg  BID, vistaril 50mg  q6h prn, which he was taking twice a day, ibuprofen 600mg  for pain and trazodone 15mg  qhs.   His labs were done with normal  BMP, A1C, lipid and TSH on 12/15/16. On discharge, he maintained the same medications except for vistaril. [facility] does not prescribe klonopin.   Today Patient reports feeling wired on vistaril, as well as increased heart rate with palpitations. He was having anxiety attacks at [facility]. He requested klonopin, but informed only a day.   He's going to [facility] tomorrow with an appointment. He's been there before, when his brother was killed. He drank an energy drink prior to coming into office today.   Medications Trazodone- working well, sleep is good Abilify- tried to get off of it, but right now isn't a good time; his dad's settlement has been weighing on his mind Ibuprofen- 800mg  at a time, but not everyday, few times a week.  Klonopin- taken 3/4 tablet today 5 htp - states taking 15mg  for sleep Melatonin- 9mg  for sleep c60- carbon 60 molecules (from soot) helped his leg heal cbd oil- relief with pain  He states his stomach is doing better. He's been improvement with his weight, and able to run 2 miles now. He denies urinary symptoms.  Takes the klonopin about every other day - 1/2 tab - when he has anxiety. Taking the abilify every night and then usually uses the melatnonin and 5-HTP every night but when he can't sleep, he will do the trazodone - very rare.  He did go to [facility] for the assessment and they said to continue on current regimen  Tobacco abuse He dips tobacco, about 4 pouches a day. He's trying  to cut back to 2 pouches, and eventually none. He denies any sinus congestion.  Has been cutting down.  His mother smokes a lot, and he wakes up to heavy tobacco smell.   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 This has been occupying his time with PTSD. Working [employer].  Past Medical History:  Diagnosis Date  . Anxiety   . Arthritis   . Disorder of pineal gland   . Post traumatic stress disorder    Prior to Admission medications   Medication  Sig Start Date End Date Taking? Authorizing Provider  acetaminophen (TYLENOL) 325 MG tablet Take 2 tablets (650 mg total) by mouth every 6 (six) hours as needed. 08/08/13   Piepenbrink, Victorino DikeJennifer, PA-C  ARIPiprazole (ABILIFY) 15 MG tablet Take 1 tablet (15 mg total) by mouth daily. 08/22/16   Sherren MochaShaw, Rashawn Rolon N, MD  budesonide (RHINOCORT AQUA) 32 MCG/ACT nasal spray Place 2 sprays into both nostrils daily. 08/22/16   Sherren MochaShaw, Tirsa Gail N, MD  clonazePAM (KLONOPIN) 1 MG tablet Take 0.5 tablets (0.5 mg total) by mouth 2 (two) times daily. 08/22/16   Sherren MochaShaw, Romone Shaff N, MD  ibuprofen (ADVIL,MOTRIN) 800 MG tablet Take 1 tablet (800 mg total) by mouth 3 (three) times daily. 11/08/12   Fayrene Helperran, Bowie, PA-C   Allergies  Allergen Reactions  . Penicillins   . Hydroxyzine Anxiety   History reviewed. No pertinent surgical history. History reviewed. No pertinent family history. Social History   Tobacco Use  . Smoking status: Never Smoker  . Smokeless tobacco: Current User    Types: Chew  Substance Use Topics  . Alcohol use: No  . Drug use: No    Comment: one year ago   Depression screen [facility] 2/9 03/20/2017 03/20/2017 12/23/2016 08/22/2016 07/10/2016  Decreased Interest 0 0 0 0 0  Down, Depressed, Hopeless 0 0 0 0 0  PHQ - 2 Score 0 0 0 0 0    Review of Systems  Constitutional: Negative for fatigue and unexpected weight change.  Eyes: Negative for visual disturbance.  Respiratory: Negative for cough, chest tightness and shortness of breath.   Cardiovascular: Negative for chest pain, palpitations and leg swelling.  Gastrointestinal: Negative for abdominal pain and blood in stool.  Neurological: Negative for dizziness, light-headedness and headaches.       Objective:   Physical Exam  Constitutional: He is oriented to person, place, and time. He appears well-developed and well-nourished. No distress.  HENT:  Head: Normocephalic and atraumatic.  Eyes: EOM are normal. Pupils are equal, round, and reactive to light.  Neck:  Neck supple.  Cardiovascular: Normal rate, regular rhythm, S1 normal, S2 normal and normal heart sounds.  No murmur heard. Pulmonary/Chest: Effort normal and breath sounds normal. No respiratory distress. He has no wheezes.  Musculoskeletal: Normal range of motion.  Neurological: He is alert and oriented to person, place, and time.  Skin: Skin is warm and dry.  Psychiatric: He has a normal mood and affect. His behavior is normal.  Nursing note and vitals reviewed.   BP 108/66   Pulse 74   Temp 97.7 F (36.5 C)   Resp 16   Ht 6' (1.829 m)   Wt 203 lb 12.8 oz (92.4 kg)   SpO2 98%   BMI 27.64 kg/m      Assessment & Plan:   1. Mood disorder (HCC)   2. PTSD (post-traumatic stress disorder)   3. Bipolar 1 disorder, manic, full remission (HCC)   4. Schizophrenia, paranoid  type (HCC)   5. Chewing tobacco nicotine dependence without complication    Today I have utilized the [place] Controlled Substance Registry's online query to confirm compliance regarding the patient's controlled medications. My review reveals appropriate prescription fills and that I am the sole provider of these medications. Rechecks will occur regularly and the patient is aware of our use of the system.  Filled the 5/11, 6/12, 7/24, 8/23, 9/19. So didn't use one of the klonopin refills.  Currently using klonopin prn - on average 1/2 tab qod so today's rx should last him a year. Ok to give new rx in 6 mos if he has not used all of this one and the refills have just expired. However, if he has used all 6 mos and use increasing, should likely be seen in clinic to discuss other augmenting medications for her anxiety.    Meds ordered this encounter  Medications  . clonazePAM (KLONOPIN) 1 MG tablet    Sig: Take 0.5 tablets (0.5 mg total) by mouth 2 (two) times daily as needed for anxiety.    Dispense:  30 tablet    Refill:  5  . ARIPiprazole (ABILIFY) 15 MG tablet    Sig: Take 1 tablet (15 mg total) by mouth at bedtime.      Dispense:  90 tablet    Refill:  3  . traZODone (DESYREL) 50 MG tablet    Sig: Take 1 tablet (50 mg total) by mouth at bedtime as needed for sleep.    Dispense:  30 tablet    Refill:  5    Norberto SorensonEva Shawnetta Lein, M.D.  Primary Care at [facility]  [place] [address] [place], [place] [id number] [phone] phone [phone] fax  03/20/17 10:42 AM

## 2017-04-25 ENCOUNTER — Telehealth: Payer: Self-pay | Admitting: Family Medicine

## 2017-04-25 NOTE — Telephone Encounter (Signed)
Copied from CRM [id number]. Topic: General - Other >> Apr 25, 2017 11:05 AM Ronald [name], Yuya Vanwingerden L, RMA wrote: Reason for CRM: patient is requesting a call back from Dr. Clelia CroftShaw concerning anti-inflammation and use of [place], [place] needs authorization and has fax release of records to our office

## 2017-04-25 NOTE — Telephone Encounter (Signed)
Because Marijuana is not legal for medical prescription in [place] where I did all my training and have always practiced and the only state I am licensed in, I have no idea about risks of combining/using this with other meds. In general, I would recommend that it not be combined with any sedatives or controlled medication - however, there is no known contraindication to using anti-inflammatory medication with cannabis that I am aware of.    [facility] [place] [facility] [place] [facility].

## 2017-04-28 NOTE — Telephone Encounter (Signed)
Patient was informed.  He will call back with the number for the doctors office were he would like his records faxed

## 2017-04-29 NOTE — Telephone Encounter (Signed)
I spoke with pt on 04/29/17. What he is needing is not at our office.

## 2017-10-15 ENCOUNTER — Telehealth: Payer: Self-pay | Admitting: Family Medicine

## 2017-10-15 NOTE — Telephone Encounter (Signed)
Copied from CRM [id number]. Topic: Quick Communication - See Telephone Encounter >> Oct 15, 2017 12:18 PM Cipriano BunkerLambe, Annette S wrote: CRM for notification. See Telephone encounter for: 10/15/17.  Pt called needing a letter for the attorney Fax #  [phone]  stating that Ronald [name] has been under Dr. Clelia CroftShaw and Dr. Netta Corriganoolittle's care and that doing better Will need dates of treatment in letter He is also asking if he can send him a copy of letter

## 2017-10-17 NOTE — Telephone Encounter (Signed)
Letter typed out on letterhead scanned in media and faxed to patients lawyer.

## 2017-10-21 NOTE — Telephone Encounter (Addendum)
Fax has not been received. Please refax to correct FAX # [phone]. Patient requesting call once sent. [phone]

## 2017-10-21 NOTE — Telephone Encounter (Signed)
[facility]

## 2017-10-22 NOTE — Telephone Encounter (Signed)
Pt calling back again. Pt states attorney still does not have. Pleas confirm  fax : [phone]  Can you please re fax?

## 2017-11-14 ENCOUNTER — Telehealth: Payer: Self-pay | Admitting: Family Medicine

## 2017-11-14 NOTE — Telephone Encounter (Signed)
Rx refill request: Clonazepam 1 mg           Last filled: 09/13/17  LOV: 03/20/17  PCP: Clelia CroftShaw  Pharmacy: verified

## 2017-11-17 NOTE — Telephone Encounter (Signed)
Clonazepam denied as Pt's have to be seen a minimum of every 6 mos for controlled medications like clonazepam and his last visit with me was 03/20/17 so unfortunately I cannot refill the clonazepam any further.   However, it appears [place] [facility] [facility] [place] [facility] [place].  Can we transfer his records anywhere?  Has he established w/ a new doctor? If so, I am happy to give his new provider a "peer-to-peer" call and let them know how well Molly MaduroRobert has been doing on his current medication regimen - do the "warm friendly" hand-off thing for him so he can hopefully seemlessly transfer his care to a provider in [facility] [place] who will be able to continue his care but in a safer better system where they can actually see and examine him when needed. . .  .

## 2017-11-25 NOTE — Telephone Encounter (Signed)
Left message crm in See below note

## 2017-11-28 ENCOUNTER — Other Ambulatory Visit: Payer: Self-pay | Admitting: Family Medicine

## 2017-11-28 NOTE — Telephone Encounter (Signed)
Klonopin 1 mg refill Last Refill:03/20/17 # 30 Last OV: 03/20/17 [facility] [address] [place], [place] - [address]

## 2017-12-04 ENCOUNTER — Other Ambulatory Visit: Payer: Self-pay | Admitting: Family Medicine

## 2017-12-04 NOTE — Telephone Encounter (Signed)
Copied from CRM [id number]. Topic: Quick Communication - Rx Refill/Question >> Dec 04, 2017  9:13 AM Alexander BergeronBarksdale, Harvey B wrote: Medication: clonazePAM (KLONOPIN) 1 MG tablet [[id number]][[id number]]   Pt is needing authorization on getting the medication above [facility] @ [address] (zip code: [phone]); contact pt to advise

## 2017-12-04 NOTE — Telephone Encounter (Signed)
Klonopin 1 mg refill Last Refill:03/20/17 # 30 Last OV: 03/20/17 PCP: Clelia CroftShaw Pharmacy:[facility] [address] - [place], [place] - [address]  Pt needing authorization on getting the Klonopin filled at the above pharmacy.

## 2017-12-05 NOTE — Telephone Encounter (Signed)
Patient is requesting a refill of the following medications: Requested Prescriptions   Pending Prescriptions Disp Refills  . clonazePAM (KLONOPIN) 1 MG tablet [Pharmacy Med Name: CLONAZEPAM 1MG       TAB] 30 tablet 4    Sig: TAKE 1/2 (ONE-HALF) TABLET BY MOUTH TWICE DAILY AS NEEDED FOR ANXIETY    Date of patient request: 12/04/2017 Last office visit: 03/22/2017 Date of last refill: 03/20/2017 Last refill amount: 30 refills. 5 Follow up time period per chart:

## 2017-12-08 NOTE — Telephone Encounter (Signed)
Patient states [place] moved to [place], he is only working there until December. He said that at his last appointment that he told Dr Clelia CroftShaw this and she agreed that it would be fine to keep refilling this until his next appointment, which is in December. Patient is upset because he has not been reached out about this and has been trying to get this since 8/5. Please advise. Patient would like a call back from the office. [phone]

## 2017-12-08 NOTE — Telephone Encounter (Signed)
As was noted on the last refill request for this med: Clonazepam denied as Pt's have to be seen a minimum of every 6 mos for controlled medications like clonazepam and his last visit with me was 03/20/17 so unfortunately I cannot refill the clonazepam any further.   However, it appears [facility] [place].  Can we transfer his records anywhere?  Has he established w/ a new doctor? If so, I am happy to give his new provider a "peer-to-peer" call and let them know how well Ronald MaduroRobert has been doing on his current medication regimen - do the "warm friendly" hand-off thing for him so he can hopefully seemlessly transfer his care to a [place] [facility] [place] who will be able to continue his care but in a safer better system where they can actually see and examine him when needed. . .  .

## 2017-12-10 MED ORDER — CLONAZEPAM 1 MG PO TABS: 0.5000 mg | ORAL_TABLET | Freq: Two times a day (BID) | ORAL | 2 refills | Status: DC | PRN

## 2017-12-10 NOTE — Telephone Encounter (Signed)
At pt's last visit - see note - he reported he was taking 1/2 tab of klonopin every other day in which case a 6 month rx for #30 tabs/mo should have lasted him 2 years.  He must have misunderstood me - I meant that if he couldn't get the refills on his rx because they expired after 6 mos, then we could call in a new rx for the remaining unused refills. However, review of the [place] Controlled Substance Registry's online query shows that he filled all 6 fills on the rx written on 12/6 - on 12/6, 1/9, 2/10, 3/12, 4/13, and 6/1.  My review reveals appropriate prescription fills and that I am the sole provider of these medications.   Will allow 1 time exception to send in an additional 3 mo supply - that will get him to Dec.

## 2017-12-10 NOTE — Addendum Note (Signed)
Addended by: Sherren MochaSHAW, Dayne Chait N on: 12/10/2017 01:29 AM   Modules accepted: Orders

## 2018-01-29 ENCOUNTER — Telehealth: Payer: Self-pay

## 2018-01-29 NOTE — Telephone Encounter (Signed)
Patient called regarding getting a letter from Dr Clelia Croft in reference to his care and medication. Patient sad letter should state that he is currently under the care of Dr Norberto Sorenson and she is monitoring his medication. states that this is a time sensitive matter and is needed right away This letter will be picked up buy his mom. Ms [name] on 01/30/18. Pts.  Ph# [phone]

## 2018-01-29 NOTE — Telephone Encounter (Signed)
Please advise 

## 2018-01-29 NOTE — Telephone Encounter (Signed)
Call from pt's mother, [name].  States pt is requesting letter from Dr. Clelia Croft stating she is monitoring all of his medication.  Lengthy discussion with mother re letter.  Needs it to state pt is being monitored for Abilify and needs to be sent to pt's attorney by 5:00 tomorrow.  Mother states pt is temporarily in [facility] for approx the last year and that he may come back to [facility] in Feb 2020 or later.  Call to pharmacy used for Abilify, pharmacy confirms that Abilify was filled 03/21/2017 but has not been refilled or transferred since then.  Call to pt who states medication is filled at [facility] on file.  Call to [facility] in [place].  Pt has never filled Abilify there.  Call to pt to advise.  Pt states he got a big bottle from [employer] and has been cutting into quarters and has sometimes not taken it.  Advised pt that because he was not taking Rx as prescribed and has not filled since Dec 2018 we could not write letter to his attorney that he is taking Abilify.

## 2018-01-30 NOTE — Telephone Encounter (Signed)
Patient called to speak with Ronald [name] said that he spoke with her on 01/29/18. Need to speak with her to explain a situation. Ph# [phone]

## 2018-02-06 NOTE — Telephone Encounter (Signed)
Patient called to inquire about letter that was to be faxed to his attorney. Please see message below. Ph# [phone] and the Fax# [phone]

## 2018-02-10 NOTE — Telephone Encounter (Signed)
Yes, it is fine to write letter for pt per his request stating: To Whom It May Concern:  Ronald [name] is currently receiving comprehensive medical management of both his mental and physical helath from his primary care physician Dr. Norberto Sorenson whom he has seen routinely since his prior primary physician's, and colleague of Dr. Alver Fisher, Dr. [name] Doolittle's, retirement.  He was last seen by Dr. Clelia Croft on 03/22/2017 at which point it was noted that [name] was doing very well on his current medical regimen and on examination appeared to be in good mental and physical health. Since his most recent visit 11 months prior, he has been unable to rrevaluated or examined due to his temporary move out of state.    His previous office visits with Dr. Clelia Croft occurred: (list dates of prior OV).  His previous visits with Dr. Merla Riches occurred on the following dates (though may have had additional prior visits pto the adoptions of our current electronic medical record system in 2013): (Iist dates of prior OV).  Sincerely,   Dr. Clelia Croft

## 2018-02-10 NOTE — Telephone Encounter (Signed)
Letter has been completed and signed.

## 2018-03-05 NOTE — Telephone Encounter (Signed)
L/m for pt that based on info previously obtained, not able to provide letter to attorney.  Pt can c/b with additional questions.

## 2018-03-16 ENCOUNTER — Ambulatory Visit: Payer: Self-pay | Admitting: Family Medicine

## 2018-11-15 ENCOUNTER — Emergency Department (HOSPITAL_COMMUNITY): Payer: Self-pay

## 2018-11-15 ENCOUNTER — Encounter (HOSPITAL_COMMUNITY): Payer: Self-pay | Admitting: *Deleted

## 2018-11-15 ENCOUNTER — Emergency Department (HOSPITAL_COMMUNITY)
Admission: EM | Admit: 2018-11-15 | Discharge: 2018-11-15 | Disposition: A | Discharge status: Home / Self Care | Payer: Self-pay | Attending: Emergency Medicine | Admitting: Emergency Medicine

## 2018-11-15 DIAGNOSIS — Y999 Unspecified external cause status: Secondary | ICD-10-CM | POA: Insufficient documentation

## 2018-11-15 DIAGNOSIS — G44319 Acute post-traumatic headache, not intractable: Secondary | ICD-10-CM | POA: Insufficient documentation

## 2018-11-15 DIAGNOSIS — R112 Nausea with vomiting, unspecified: Secondary | ICD-10-CM | POA: Insufficient documentation

## 2018-11-15 DIAGNOSIS — Z79899 Other long term (current) drug therapy: Secondary | ICD-10-CM | POA: Insufficient documentation

## 2018-11-15 DIAGNOSIS — W19XXXA Unspecified fall, initial encounter: Secondary | ICD-10-CM | POA: Insufficient documentation

## 2018-11-15 DIAGNOSIS — M542 Cervicalgia: Secondary | ICD-10-CM | POA: Insufficient documentation

## 2018-11-15 DIAGNOSIS — R110 Nausea: Secondary | ICD-10-CM

## 2018-11-15 DIAGNOSIS — Y929 Unspecified place or not applicable: Secondary | ICD-10-CM | POA: Insufficient documentation

## 2018-11-15 DIAGNOSIS — Y939 Activity, unspecified: Secondary | ICD-10-CM | POA: Insufficient documentation

## 2018-11-15 LAB — COMPREHENSIVE METABOLIC PANEL
ALT: 23 U/L (ref 0–44)
AST: 24 U/L (ref 15–41)
Albumin: 4.6 g/dL (ref 3.5–5.0)
Alkaline Phosphatase: 60 U/L (ref 38–126)
Anion gap: 13 (ref 5–15)
BUN: 6 mg/dL (ref 6–20)
CO2: 22 mmol/L (ref 22–32)
Calcium: 9.5 mg/dL (ref 8.9–10.3)
Chloride: 105 mmol/L (ref 98–111)
Creatinine, Ser: 1.27 mg/dL — ABNORMAL HIGH (ref 0.61–1.24)
GFR calc Af Amer: >60 mL/min (ref >60)
GFR calc non Af Amer: >60 mL/min (ref >60)
Glucose, Bld: 96 mg/dL (ref 70–99)
Potassium: 4.2 mmol/L (ref 3.5–5.1)
Sodium: 140 mmol/L (ref 135–145)
Total Bilirubin: 0.5 mg/dL (ref 0.3–1.2)
Total Protein: 7.2 g/dL (ref 6.5–8.1)

## 2018-11-15 LAB — CBC WITH DIFFERENTIAL/PLATELET
Abs Immature Granulocytes: 0.04 10*3/uL (ref 0.00–0.07)
Basophils Absolute: 0 10*3/uL (ref 0.0–0.1)
Basophils Relative: 0 %
Eosinophils Absolute: 0 10*3/uL (ref 0.0–0.5)
Eosinophils Relative: 0 %
HCT: 46.4 % (ref 39.0–52.0)
Hemoglobin: 15.9 g/dL (ref 13.0–17.0)
Immature Granulocytes: 0 %
Lymphocytes Relative: 16 %
Lymphs Abs: 1.7 10*3/uL (ref 0.7–4.0)
MCH: 30.6 pg (ref 26.0–34.0)
MCHC: 34.3 g/dL (ref 30.0–36.0)
MCV: 89.2 fL (ref 80.0–100.0)
Monocytes Absolute: 0.3 10*3/uL (ref 0.1–1.0)
Monocytes Relative: 3 %
Neutro Abs: 8.3 10*3/uL — ABNORMAL HIGH (ref 1.7–7.7)
Neutrophils Relative %: 81 %
Platelets: 248 10*3/uL (ref 150–400)
RBC: 5.2 MIL/uL (ref 4.22–5.81)
RDW: 12.5 % (ref 11.5–15.5)
WBC: 10.4 10*3/uL (ref 4.0–10.5)
nRBC: 0 % (ref 0.0–0.2)

## 2018-11-15 MED ORDER — ONDANSETRON 4 MG PO TBDP: 4.0000 mg | ORAL_TABLET | Freq: Three times a day (TID) | ORAL | 0 refills | Status: DC | PRN

## 2018-11-15 MED ORDER — SODIUM CHLORIDE 0.9 % IV BOLUS
1000.0000 mL | Freq: Once | INTRAVENOUS | Status: AC
  Administered 2018-11-15: 1000 mL via INTRAVENOUS

## 2018-11-15 MED ORDER — ONDANSETRON HCL 4 MG/2ML IJ SOLN
4.0000 mg | Freq: Once | INTRAMUSCULAR | Status: AC
  Administered 2018-11-15: 4 mg via INTRAVENOUS
  Filled 2018-11-15: qty 2

## 2018-11-15 MED ORDER — MORPHINE SULFATE (PF) 4 MG/ML IV SOLN
4.0000 mg | Freq: Once | INTRAVENOUS | Status: AC
  Administered 2018-11-15: 4 mg via INTRAVENOUS
  Filled 2018-11-15: qty 1

## 2018-11-15 NOTE — ED Provider Notes (Signed)
[facility] Provider Note   CSN: [id number] Arrival date & time: 11/15/18  1415  History   Chief Complaint Chief Complaint  Patient presents with  . Nausea    HPI Ronald [name] is a 38 y.o. male with past medical history significant for PTSD, anxiety, bipolar disorder, traumatic subdural presents for evaluation of headache and nausea after fall.  Patient states 3 days ago he had increased anxiety so he took his home Klonopin.  Patient states that he had not taken this for about 8 months prior.  Patient states when he took this he had some dizziness.  Patient states he was sitting on the toilet and was having a bowel movement when he became dizzy and fell to the side.  Patient states he awoke to his mother standing over him.  Patient states he has had posterior neck and head pain since the incident.  Has also had some intermittent nausea.  Patient states he was concerned about "blood in my brain."  Given his history of traumatic subdurals.  Denies fever, chills, lateral weakness, blurred vision, slurred speech sudden onset thunderclap headache, emesis, chest pain, shortness of breath.  Patient denied preceding aura, chest pain or headache.  Tetanus up-to-date.  History obtained from patient and family in room.  No interpreter is used.       HPI  Past Medical History:  Diagnosis Date  . Anxiety   . Arthritis   . Disorder of pineal gland   . Post traumatic stress disorder     Patient Active Problem List   Diagnosis Date Noted  . Schizophrenia, paranoid type (HCC) 12/23/2016  . PTSD (post-traumatic stress disorder) 12/15/2016  . Bipolar I disorder, single manic episode, severe with psychotic features (HCC) 09/29/2015  . Mood disorder (HCC) 09/22/2015  . Disorder of pineal gland     History reviewed. No pertinent surgical history.      Home Medications    Prior to Admission medications   Medication Sig Start Date End Date Taking?  Authorizing Provider  5-Hydroxytryptophan (5-HTP) 50 MG CAPS Take 1 tablet by mouth at bedtime. 12/23/16   Sherren MochaShaw, Eva N, MD  acetaminophen (TYLENOL) 325 MG tablet Take 2 tablets (650 mg total) by mouth every 6 (six) hours as needed. 08/08/13   Piepenbrink, Victorino DikeJennifer, PA-C  ARIPiprazole (ABILIFY) 15 MG tablet Take 1 tablet (15 mg total) by mouth at bedtime. 03/20/17   Sherren MochaShaw, Eva N, MD  budesonide (RHINOCORT AQUA) 32 MCG/ACT nasal spray Place 2 sprays into both nostrils daily. 08/22/16   Sherren MochaShaw, Eva N, MD  clonazePAM (KLONOPIN) 1 MG tablet Take 0.5 tablets (0.5 mg total) by mouth 2 (two) times daily as needed for anxiety. 12/10/17   Sherren MochaShaw, Eva N, MD  ibuprofen (ADVIL,MOTRIN) 800 MG tablet Take 1 tablet (800 mg total) by mouth 3 (three) times daily. 11/08/12   Fayrene Helperran, Bowie, PA-C  Melatonin 10 MG CAPS Take 10 mg by mouth at bedtime. 12/23/16   Sherren MochaShaw, Eva N, MD  nicotine polacrilex (NICORETTE) 4 MG gum Take by mouth. 12/18/16   [provider]  ondansetron (ZOFRAN ODT) 4 MG disintegrating tablet Take 1 tablet (4 mg total) by mouth every 8 (eight) hours as needed for nausea or vomiting. 11/15/18   Paxon Propes A, PA-C  traZODone (DESYREL) 50 MG tablet Take 1 tablet (50 mg total) by mouth at bedtime as needed for sleep. 03/20/17   Sherren MochaShaw, Eva N, MD  UNABLE TO FIND Med Name: CBD OIL 12/23/16  Shawnee Knapp, MD  UNABLE TO FIND Med Name: C-60 Tomma Lightning supplement for healing) 12/23/16   Shawnee Knapp, MD    Family History No family history on file.  Social History Social History   Tobacco Use  . Smoking status: Never Smoker  . Smokeless tobacco: Current User    Types: Chew  Substance Use Topics  . Alcohol use: No  . Drug use: No    Comment: one year ago     Allergies   Penicillins and Hydroxyzine   Review of Systems Review of Systems  Constitutional: Negative.   HENT: Negative.   Eyes: Negative.   Respiratory: Negative.   Cardiovascular: Negative.   Gastrointestinal: Positive for nausea. Negative  for abdominal distention, abdominal pain, anal bleeding, blood in stool, constipation, diarrhea, rectal pain and vomiting.  Genitourinary: Negative.   Musculoskeletal: Positive for neck pain. Negative for back pain, gait problem, joint swelling, myalgias and neck stiffness.  Skin: Negative.   Neurological: Positive for headaches. Negative for dizziness, tremors, seizures, syncope, facial asymmetry, speech difficulty, weakness, light-headedness and numbness.  All other systems reviewed and are negative.    Physical Exam Updated Vital Signs BP 129/86 (BP Location: Right Arm)   Pulse 64   Resp 18   SpO2 96%   Physical Exam  Physical Exam  Constitutional: Pt is oriented to person, place, and time. Pt appears well-developed and well-nourished. No distress.  HENT:  Head: Normocephalic and atraumatic.  Mouth/Throat: Oropharynx is clear and moist.  Eyes: Conjunctivae and EOM are normal. Pupils are equal, round, and reactive to light. No scleral icterus.  No horizontal, vertical or rotational nystagmus  Neck: Normal range of motion. Neck supple.  Full active and passive ROM without pain No midline or paraspinal tenderness No nuchal rigidity or meningeal signs  Cardiovascular: Normal rate, regular rhythm and intact distal pulses.   Pulmonary/Chest: Effort normal and breath sounds normal. No respiratory distress. Pt has no wheezes. No rales.  Abdominal: Soft. Bowel sounds are normal. There is no tenderness. There is no rebound and no guarding.  Musculoskeletal: Normal range of motion.  Lymphadenopathy:    No cervical adenopathy.  Neurological: Pt. is alert and oriented to person, place, and time. He has normal reflexes. No cranial nerve deficit.  Exhibits normal muscle tone. Coordination normal.  Skin: Small superficial skin tear between eyebrows.  No active bleeding or drainage.  No area to suture. Mental Status:  Alert, oriented, thought content appropriate. Speech fluent without  evidence of aphasia. Able to follow 2 step commands without difficulty.  Cranial Nerves:  II:  Peripheral visual fields grossly normal, pupils equal, round, reactive to light III,IV, VI: ptosis not present, extra-ocular motions intact bilaterally  V,VII: smile symmetric, facial light touch sensation equal VIII: hearing grossly normal bilaterally  IX,X: midline uvula rise  XI: bilateral shoulder shrug equal and strong XII: midline tongue extension  Motor:  5/5 in upper and lower extremities bilaterally including strong and equal grip strength and dorsiflexion/plantar flexion Sensory: Pinprick and light touch normal in all extremities.  Deep Tendon Reflexes: 2+ and symmetric  Cerebellar: normal finger-to-nose with bilateral upper extremities Gait: normal gait and balance CV: distal pulses palpable throughout   Skin: Skin is warm and dry. No rash noted. Pt is not diaphoretic.  Psychiatric: Pt has a normal mood and affect. Behavior is normal. Judgment and thought content normal.  Nursing note and vitals reviewed. ED Treatments / Results  Labs (all labs ordered are listed, but only abnormal  results are displayed) Labs Reviewed  CBC WITH DIFFERENTIAL/PLATELET - Abnormal; Notable for the following components:      Result Value   Neutro Abs 8.3 (*)    All other components within normal limits  COMPREHENSIVE METABOLIC PANEL - Abnormal; Notable for the following components:   Creatinine, Ser 1.27 (*)    All other components within normal limits    EKG EKG Interpretation  Date/Time:  Sunday November 15 2018 16:48:34 EDT Ventricular Rate:  61 PR Interval:    QRS Duration: 78 QT Interval:  372 QTC Calculation: 375 R Axis:   85 Text Interpretation:  Sinus rhythm ST elevation diffusely likely early repol No old tracing to compare Confirmed by Pricilla Loveless [phone]) on 11/15/2018 5:40:06 PM   Radiology Ct Head Wo Contrast  Result Date: 11/15/2018 CLINICAL DATA:  Headache and syncope with  fall EXAM: CT HEAD WITHOUT CONTRAST CT CERVICAL SPINE WITHOUT CONTRAST TECHNIQUE: Multidetector CT imaging of the head and cervical spine was performed following the standard protocol without intravenous contrast. Multiplanar CT image reconstructions of the cervical spine were also generated. COMPARISON:  None. FINDINGS: CT HEAD FINDINGS Brain: The ventricles are normal in size and configuration. There is no intracranial mass, hemorrhage, extra-axial fluid collection, or midline shift. The brain parenchyma appears unremarkable. No evident acute infarct. Vascular: No hyperdense vessel.  No evident vascular calcification. Skull: Bony calvarium appears intact. Sinuses/Orbits: There is a retention cyst in the inferior right maxillary antrum. There is mucosal thickening in several ethmoid air cells. Orbits appear symmetric bilaterally. Other: Mastoid air cells are clear. CT CERVICAL SPINE FINDINGS Alignment: There is no demonstrable spondylolisthesis. Skull base and vertebrae: Skull base and craniocervical junction regions appear normal. No evident fracture. No blastic or lytic bone lesions. Soft tissues and spinal canal: Prevertebral soft tissues and predental space regions are normal. There is no evident cord or canal hematoma. No paraspinous lesions are evident. Disc levels: Disc spaces appear normal. No nerve root edema or effacement. No disc extrusion or stenosis. No appreciable facet arthropathy. Upper chest: Limited visualization within normal limits. Other: None IMPRESSION: CT head: Areas of paranasal sinus disease. Study otherwise unremarkable. CT cervical spine: No fracture or spondylolisthesis. No appreciable arthropathy. No nerve root edema or effacement. No disc extrusion or stenosis. Electronically Signed   By: Bretta Bang III M.D.   On: 11/15/2018 16:43   Ct Cervical Spine Wo Contrast  Result Date: 11/15/2018 CLINICAL DATA:  Headache and syncope with fall EXAM: CT HEAD WITHOUT CONTRAST CT CERVICAL  SPINE WITHOUT CONTRAST TECHNIQUE: Multidetector CT imaging of the head and cervical spine was performed following the standard protocol without intravenous contrast. Multiplanar CT image reconstructions of the cervical spine were also generated. COMPARISON:  None. FINDINGS: CT HEAD FINDINGS Brain: The ventricles are normal in size and configuration. There is no intracranial mass, hemorrhage, extra-axial fluid collection, or midline shift. The brain parenchyma appears unremarkable. No evident acute infarct. Vascular: No hyperdense vessel.  No evident vascular calcification. Skull: Bony calvarium appears intact. Sinuses/Orbits: There is a retention cyst in the inferior right maxillary antrum. There is mucosal thickening in several ethmoid air cells. Orbits appear symmetric bilaterally. Other: Mastoid air cells are clear. CT CERVICAL SPINE FINDINGS Alignment: There is no demonstrable spondylolisthesis. Skull base and vertebrae: Skull base and craniocervical junction regions appear normal. No evident fracture. No blastic or lytic bone lesions. Soft tissues and spinal canal: Prevertebral soft tissues and predental space regions are normal. There is no evident cord or canal hematoma.  No paraspinous lesions are evident. Disc levels: Disc spaces appear normal. No nerve root edema or effacement. No disc extrusion or stenosis. No appreciable facet arthropathy. Upper chest: Limited visualization within normal limits. Other: None IMPRESSION: CT head: Areas of paranasal sinus disease. Study otherwise unremarkable. CT cervical spine: No fracture or spondylolisthesis. No appreciable arthropathy. No nerve root edema or effacement. No disc extrusion or stenosis. Electronically Signed   By: Bretta BangWilliam  Woodruff III M.D.   On: 11/15/2018 16:43   Dg Abdomen Acute W/chest  Result Date: 11/15/2018 CLINICAL DATA:  Status post fall with pain. EXAM: DG ABDOMEN ACUTE W/ 1V CHEST COMPARISON:  None. FINDINGS: There is no evidence of dilated  bowel loops or free intraperitoneal air. No radiopaque calculi or other significant radiographic abnormality is seen. Heart size and mediastinal contours are within normal limits. Both lungs are clear. IMPRESSION: Negative abdominal radiographs.  No acute cardiopulmonary disease. Electronically Signed   By: Sherian ReinWei-Chen  Lin M.D.   On: 11/15/2018 16:21   Procedures Procedures (including critical care time)  Medications Ordered in ED Medications  sodium chloride 0.9 % bolus 1,000 mL (1,000 mLs Intravenous New Bag/Given 11/15/18 1638)  ondansetron (ZOFRAN) injection 4 mg (4 mg Intravenous Given 11/15/18 1639)  morphine 4 MG/ML injection 4 mg (4 mg Intravenous Given 11/15/18 1639)   Initial Impression / Assessment and Plan / ED Course  I have reviewed the triage vital signs and the nursing notes.  Pertinent labs & imaging results that were available during my care of the patient were reviewed by me and considered in my medical decision making (see chart for details).  38 year old male appears otherwise well presents for evaluation of headache and nausea after fall 3 days ago.  Patient took Klonopin which caused him to feel dizzy where he fell off the toilet after having a bowel movement.  Patient denies preceding aura, chest pain, sudden onset thunderclap headache.  Patient with tenderness palpation to his posterior head.  Does have small skin tear between eyebrows.  No lacerations to suture.  Tetanus has been up-to-date.  History of traumatic subdural in 2002.  No history of anticoagulation.  No LOC.  Will obtain work-up and reevaluate.  Images and labs personal review: CBC without leukocytosis, hemoglobin 15.9 Metabolic panel without acute abnormality. CT head without acute intracranial pathology CT cervical spine without acute pathology DG chest without infiltrates, cardiomegaly, pulmonary edema, pneumothorax EKG with ST elevation however likely early repol. Denies CP, SOB  Reevaluation patient  tolerating p.o. intake without difficulty.  He is ambulating without difficulty.  Continues to be neurologically intact.  Imaging does not show acute pathology.  Likely MSK pain after fall.  Will DC home with Zofran for his nausea.  Discussed Tylenol ibuprofen as needed for his headache.  Patient to follow-up with PCP if he continues to have symptoms.  Patient without arrhythmia or tachycardia while here in the department.  Patient without history of congestive heart failure, normal hematocrit, normal ECG, no shortness of breath and systolic blood pressure greater than 90; patient is low risk.   Presentation non concerning [facility], [facility], Meningitis, or temporal arteritis. Pt is afebrile with no focal neuro deficits, nuchal rigidity, or change in vision. The patient has been appropriately medically screened and/or stabilized in the ED. I have low suspicion for any other emergent medical condition which would require further screening, evaluation or treatment in the ED or require inpatient management.  Patient is hemodynamically stable and in no acute distress.  Patient able  to ambulate in department prior to ED.  Evaluation does not show acute pathology that would require ongoing or additional emergent interventions while in the emergency department or further inpatient treatment.  I have discussed the diagnosis with the patient and answered all questions.  Pain is been managed while in the emergency department and patient has no further complaints prior to discharge.  Patient is comfortable with plan discussed in room and is stable for discharge at this time.  I have discussed strict return precautions for returning to the emergency department.  Patient was encouraged to follow-up with PCP/specialist refer to at discharge.      Final Clinical Impressions(s) / ED Diagnoses   Final diagnoses:  Acute post-traumatic headache, not intractable  Nausea  Fall, initial encounter  Neck pain    ED Discharge  Orders         Ordered    ondansetron (ZOFRAN ODT) 4 MG disintegrating tablet  Every 8 hours PRN     11/15/18 1803           Natausha Jungwirth A, PA-C 11/15/18 1804    Sabas SousBero, Michael M, MD 11/19/18 [phone]

## 2018-11-15 NOTE — ED Notes (Signed)
Patient verbalizes understanding of discharge instructions. Opportunity for questioning and answers were provided. Armband removed by staff, pt discharged from ED.   Pt unable to sign signature pad  

## 2018-11-15 NOTE — ED Triage Notes (Addendum)
To ED for eval of HA since falling from toilet 3 nights ago. States he had taken a klonopin for the first time 'in a long time' and was feeling weird. Has not taken any more klonopin. States he feels like he has a concussion. Nauseated but 'better than it was'. States his HA has lessened as well since taking meds at home. Alert and oriented. Appears in nad. States he feels drunk but 'not in a good way'. No neuro deficits noted.

## 2018-11-15 NOTE — ED Notes (Signed)
Patient transported to X-ray 

## 2018-11-15 NOTE — Discharge Instructions (Signed)
Take the Zofran as prescribed for nausea.  Follow-up with PCP for reevaluation for continued of symptoms.  Return to the ED for any new worsening symptoms.

## 2018-11-16 ENCOUNTER — Other Ambulatory Visit: Payer: Self-pay

## 2018-11-16 ENCOUNTER — Emergency Department (HOSPITAL_BASED_OUTPATIENT_CLINIC_OR_DEPARTMENT_OTHER)
Admission: EM | Admit: 2018-11-16 | Discharge: 2018-11-16 | Disposition: A | Discharge status: Home / Self Care | Payer: Self-pay | Attending: Emergency Medicine | Admitting: Emergency Medicine

## 2018-11-16 ENCOUNTER — Encounter (HOSPITAL_BASED_OUTPATIENT_CLINIC_OR_DEPARTMENT_OTHER): Payer: Self-pay | Admitting: Emergency Medicine

## 2018-11-16 DIAGNOSIS — F4310 Post-traumatic stress disorder, unspecified: Secondary | ICD-10-CM | POA: Insufficient documentation

## 2018-11-16 DIAGNOSIS — F319 Bipolar disorder, unspecified: Secondary | ICD-10-CM | POA: Insufficient documentation

## 2018-11-16 DIAGNOSIS — F419 Anxiety disorder, unspecified: Secondary | ICD-10-CM | POA: Insufficient documentation

## 2018-11-16 MED ORDER — LORAZEPAM 1 MG PO TABS: 1.0000 mg | ORAL_TABLET | Freq: Three times a day (TID) | ORAL | 0 refills | Status: DC | PRN

## 2018-11-16 MED ORDER — LORAZEPAM 1 MG PO TABS
1.0000 mg | ORAL_TABLET | Freq: Once | ORAL | Status: AC
  Administered 2018-11-16: 03:00:00 1 mg via ORAL
  Filled 2018-11-16: qty 1

## 2018-11-16 NOTE — ED Provider Notes (Signed)
[facility] EMERGENCY DEPARTMENT Provider Note   CSN: [id number] Arrival date & time: 11/16/18  0213     History   Chief Complaint Chief Complaint  Patient presents with  . Anxiety    HPI Ronald [name] is a 38 y.o. male.     HPI  This is a 38 year old male with a history of anxiety and PTSD who presents with anxiety.  Patient reports increasing anxiety over the last several days.  He states that primarily his anxiety is regarding the coronavirus.  He states that he feels very anxious and had palpitations.  He was with his mother tonight when he was unable to get his anxiety under control.  He states that he used to take Klonopin but recently has been using medical marijuana to control his anxiety.  He still smokes medical marijuana twice daily.  He has taken a Klonopin yesterday and today with minimal effects on his anxiety.  He was seen and evaluated earlier yesterday for nausea.  He states that he feels that this may have been related.  He denies any HI or SI.  He denies any hallucinations or delusions.  Mother is at the bedside.  She is concerned that he has become paranoid.  EMS was called earlier this evening.  Reports vital signs were stable and they were able to calm him down.  She reports that she is making an appointment with his primary physician for outpatient resources and counseling.  Past Medical History:  Diagnosis Date  . Anxiety   . Arthritis   . Disorder of pineal gland   . Post traumatic stress disorder     Patient Active Problem List   Diagnosis Date Noted  . Schizophrenia, paranoid type (HCC) 12/23/2016  . PTSD (post-traumatic stress disorder) 12/15/2016  . Bipolar I disorder, single manic episode, severe with psychotic features (HCC) 09/29/2015  . Mood disorder (HCC) 09/22/2015  . Disorder of pineal gland     History reviewed. No pertinent surgical history.      Home Medications    Prior to Admission medications   Medication Sig Start  Date End Date Taking? Authorizing Provider  5-Hydroxytryptophan (5-HTP) 50 MG CAPS Take 1 tablet by mouth at bedtime. 12/23/16  Yes Sherren MochaShaw, Eva N, MD  clonazePAM (KLONOPIN) 1 MG tablet Take 0.5 tablets (0.5 mg total) by mouth 2 (two) times daily as needed for anxiety. 12/10/17  Yes Sherren MochaShaw, Eva N, MD  acetaminophen (TYLENOL) 325 MG tablet Take 2 tablets (650 mg total) by mouth every 6 (six) hours as needed. 08/08/13   Piepenbrink, Victorino DikeJennifer, PA-C  ARIPiprazole (ABILIFY) 15 MG tablet Take 1 tablet (15 mg total) by mouth at bedtime. 03/20/17   Sherren MochaShaw, Eva N, MD  budesonide (RHINOCORT AQUA) 32 MCG/ACT nasal spray Place 2 sprays into both nostrils daily. 08/22/16   Sherren MochaShaw, Eva N, MD  ibuprofen (ADVIL,MOTRIN) 800 MG tablet Take 1 tablet (800 mg total) by mouth 3 (three) times daily. 11/08/12   Fayrene Helperran, Bowie, PA-C  LORazepam (ATIVAN) 1 MG tablet Take 1 tablet (1 mg total) by mouth 3 (three) times daily as needed for anxiety. 11/16/18   Breean Nannini, Mayer Maskerourtney F, MD  Melatonin 10 MG CAPS Take 10 mg by mouth at bedtime. 12/23/16   Sherren MochaShaw, Eva N, MD  nicotine polacrilex (NICORETTE) 4 MG gum Take by mouth. 12/18/16   [provider]  ondansetron (ZOFRAN ODT) 4 MG disintegrating tablet Take 1 tablet (4 mg total) by mouth every 8 (eight) hours as needed for  nausea or vomiting. 11/15/18   Henderly, Britni A, PA-C  traZODone (DESYREL) 50 MG tablet Take 1 tablet (50 mg total) by mouth at bedtime as needed for sleep. 03/20/17   Sherren MochaShaw, Eva N, MD  UNABLE TO FIND Med Name: CBD OIL 12/23/16   Sherren MochaShaw, Eva N, MD  UNABLE TO FIND Med Name: C-60 Gertie Fey(Carbon supplement for healing) 12/23/16   Sherren MochaShaw, Eva N, MD    Family History No family history on file.  Social History Social History   Tobacco Use  . Smoking status: Never Smoker  . Smokeless tobacco: Current User    Types: Chew  Substance Use Topics  . Alcohol use: No  . Drug use: No    Comment: one year ago     Allergies   Benadryl [diphenhydramine], Penicillins, and Hydroxyzine   Review  of Systems Review of Systems  Constitutional: Negative for fever.  Respiratory: Negative for shortness of breath.   Cardiovascular: Positive for palpitations.  Gastrointestinal: Negative for abdominal pain, nausea and vomiting.  Genitourinary: Negative for dysuria.  Psychiatric/Behavioral: The patient is nervous/anxious.   All other systems reviewed and are negative.    Physical Exam Updated Vital Signs BP (!) 151/99 (BP Location: Right Arm)   Pulse 82   Temp 98.6 F (37 C)   Resp 18   Ht 1.829 m (6')   SpO2 98%   BMI 27.64 kg/m   Physical Exam Vitals signs and nursing note reviewed.  Constitutional:      Appearance: He is well-developed.     Comments: Anxious appearing but nontoxic  HENT:     Head: Normocephalic and atraumatic.  Eyes:     Pupils: Pupils are equal, round, and reactive to light.  Neck:     Musculoskeletal: Neck supple.  Cardiovascular:     Rate and Rhythm: Normal rate and regular rhythm.     Heart sounds: Normal heart sounds. No murmur.  Pulmonary:     Effort: Pulmonary effort is normal. No respiratory distress.     Breath sounds: Normal breath sounds. No wheezing.  Abdominal:     General: Bowel sounds are normal.     Palpations: Abdomen is soft.     Tenderness: There is no abdominal tenderness. There is no rebound.  Musculoskeletal:     Right lower leg: No edema.     Left lower leg: No edema.  Lymphadenopathy:     Cervical: No cervical adenopathy.  Skin:    General: Skin is warm and dry.  Neurological:     Mental Status: He is alert and oriented to person, place, and time.  Psychiatric:     Comments: Anxious      ED Treatments / Results  Labs (all labs ordered are listed, but only abnormal results are displayed) Labs Reviewed - No data to display  EKG None  Radiology Ct Head Wo Contrast  Result Date: 11/15/2018 CLINICAL DATA:  Headache and syncope with fall EXAM: CT HEAD WITHOUT CONTRAST CT CERVICAL SPINE WITHOUT CONTRAST  TECHNIQUE: Multidetector CT imaging of the head and cervical spine was performed following the standard protocol without intravenous contrast. Multiplanar CT image reconstructions of the cervical spine were also generated. COMPARISON:  None. FINDINGS: CT HEAD FINDINGS Brain: The ventricles are normal in size and configuration. There is no intracranial mass, hemorrhage, extra-axial fluid collection, or midline shift. The brain parenchyma appears unremarkable. No evident acute infarct. Vascular: No hyperdense vessel.  No evident vascular calcification. Skull: Bony calvarium appears intact. Sinuses/Orbits: There is a  retention cyst in the inferior right maxillary antrum. There is mucosal thickening in several ethmoid air cells. Orbits appear symmetric bilaterally. Other: Mastoid air cells are clear. CT CERVICAL SPINE FINDINGS Alignment: There is no demonstrable spondylolisthesis. Skull base and vertebrae: Skull base and craniocervical junction regions appear normal. No evident fracture. No blastic or lytic bone lesions. Soft tissues and spinal canal: Prevertebral soft tissues and predental space regions are normal. There is no evident cord or canal hematoma. No paraspinous lesions are evident. Disc levels: Disc spaces appear normal. No nerve root edema or effacement. No disc extrusion or stenosis. No appreciable facet arthropathy. Upper chest: Limited visualization within normal limits. Other: None IMPRESSION: CT head: Areas of paranasal sinus disease. Study otherwise unremarkable. CT cervical spine: No fracture or spondylolisthesis. No appreciable arthropathy. No nerve root edema or effacement. No disc extrusion or stenosis. Electronically Signed   By: Bretta BangWilliam  Woodruff III M.D.   On: 11/15/2018 16:43   Ct Cervical Spine Wo Contrast  Result Date: 11/15/2018 CLINICAL DATA:  Headache and syncope with fall EXAM: CT HEAD WITHOUT CONTRAST CT CERVICAL SPINE WITHOUT CONTRAST TECHNIQUE: Multidetector CT imaging of the head  and cervical spine was performed following the standard protocol without intravenous contrast. Multiplanar CT image reconstructions of the cervical spine were also generated. COMPARISON:  None. FINDINGS: CT HEAD FINDINGS Brain: The ventricles are normal in size and configuration. There is no intracranial mass, hemorrhage, extra-axial fluid collection, or midline shift. The brain parenchyma appears unremarkable. No evident acute infarct. Vascular: No hyperdense vessel.  No evident vascular calcification. Skull: Bony calvarium appears intact. Sinuses/Orbits: There is a retention cyst in the inferior right maxillary antrum. There is mucosal thickening in several ethmoid air cells. Orbits appear symmetric bilaterally. Other: Mastoid air cells are clear. CT CERVICAL SPINE FINDINGS Alignment: There is no demonstrable spondylolisthesis. Skull base and vertebrae: Skull base and craniocervical junction regions appear normal. No evident fracture. No blastic or lytic bone lesions. Soft tissues and spinal canal: Prevertebral soft tissues and predental space regions are normal. There is no evident cord or canal hematoma. No paraspinous lesions are evident. Disc levels: Disc spaces appear normal. No nerve root edema or effacement. No disc extrusion or stenosis. No appreciable facet arthropathy. Upper chest: Limited visualization within normal limits. Other: None IMPRESSION: CT head: Areas of paranasal sinus disease. Study otherwise unremarkable. CT cervical spine: No fracture or spondylolisthesis. No appreciable arthropathy. No nerve root edema or effacement. No disc extrusion or stenosis. Electronically Signed   By: Bretta BangWilliam  Woodruff III M.D.   On: 11/15/2018 16:43   Dg Abdomen Acute W/chest  Result Date: 11/15/2018 CLINICAL DATA:  Status post fall with pain. EXAM: DG ABDOMEN ACUTE W/ 1V CHEST COMPARISON:  None. FINDINGS: There is no evidence of dilated bowel loops or free intraperitoneal air. No radiopaque calculi or other  significant radiographic abnormality is seen. Heart size and mediastinal contours are within normal limits. Both lungs are clear. IMPRESSION: Negative abdominal radiographs.  No acute cardiopulmonary disease. Electronically Signed   By: Sherian ReinWei-Chen  Lin M.D.   On: 11/15/2018 16:21    Procedures Procedures (including critical care time)  Medications Ordered in ED Medications  LORazepam (ATIVAN) tablet 1 mg (1 mg Oral Given 11/16/18 0245)     Initial Impression / Assessment and Plan / ED Course  I have reviewed the triage vital signs and the nursing notes.  Pertinent labs & imaging results that were available during my care of the patient were reviewed by me and considered  in my medical decision making (see chart for details).        Patient presents with anxiety.  He was seen and evaluated yesterday for nausea.  He now reports palpitations but no chest pain or shortness of breath.  He is overall nontoxic-appearing and vital signs are reassuring.  He is not tachycardic.  His exam is benign.  He does appear very anxious.  No significant pressured speech.  He does not appear to be hallucinating.  He does appear to have some insight into his anxiety.  He was given a dose of Ativan.  After Ativan he reports significant improvement of his symptoms.  I have reviewed his controlled controlled substance history.  No recent or active prescriptions for controlled substances.  I discussed with he and his mother referral for outpatient counseling and psychiatrist.  Additionally, he will be will provided with a short course of Ativan as he does not Tolerate Vistaril.  They were given strict return precautions.  At this time I do not feel he needs emergent psychiatric evaluation.  After history, exam, and medical workup I feel the patient has been appropriately medically screened and is safe for discharge home. Pertinent diagnoses were discussed with the patient. Patient was given return precautions.   Final  Clinical Impressions(s) / ED Diagnoses   Final diagnoses:  Anxiety    ED Discharge Orders         Ordered    LORazepam (ATIVAN) 1 MG tablet  3 times daily PRN,   Status:  Discontinued     11/16/18 0340    LORazepam (ATIVAN) 1 MG tablet  3 times daily PRN     11/16/18 0354           Merryl Hacker, MD 11/16/18 [phone]

## 2018-11-16 NOTE — ED Notes (Signed)
Mother at bedside requesting "a shot to calm him down." Mother to make appointment tomorrow for counseling. States he recently moved up to [facility] form [place], used marijuana for anxiety there.

## 2018-11-16 NOTE — Discharge Instructions (Addendum)
You were seen today for anxiety.  Take medications as prescribed.  Only take medications that are prescribed to you.  Follow-up as an outpatient for counseling with provided resources.  If you develop thoughts of wanting to hurt yourself or anyone else, you should be reevaluated.

## 2018-11-16 NOTE — ED Triage Notes (Signed)
Pt admits to ED with increased anxiety this week that he attributes to COVID. States he's concerned regarding it's use as a bioweapon - -states watching too much news. Tried clonopin "cratum" and "5htp" as well as chanting for anxiety.

## 2018-11-16 NOTE — ED Notes (Addendum)
Provided juice and PO ativan per orders, mother in room requesting "is there any way you can give him a shot." Pt relaxed in bed, not currently a threat to self or others. Compliant with requests. Explained reasoning for oral medications. Explained side effects, durration/onset of ativan. Pt agreeable.

## 2018-11-16 NOTE — ED Notes (Signed)
Pts family member stepped to nursing desk. Pts mother was concerned about patient and that medications administered would be ineffective. RN spoke with family member and offered to get EDP to talk with her. EDP notified and talked with patients family me,ber.

## 2018-11-17 ENCOUNTER — Encounter: Payer: Self-pay | Admitting: Emergency Medicine

## 2018-11-17 ENCOUNTER — Telehealth (INDEPENDENT_AMBULATORY_CARE_PROVIDER_SITE_OTHER): Payer: Self-pay | Admitting: Emergency Medicine

## 2018-11-17 DIAGNOSIS — F418 Other specified anxiety disorders: Secondary | ICD-10-CM

## 2018-11-17 DIAGNOSIS — F411 Generalized anxiety disorder: Secondary | ICD-10-CM

## 2018-11-17 DIAGNOSIS — F4310 Post-traumatic stress disorder, unspecified: Secondary | ICD-10-CM

## 2018-11-17 MED ORDER — SERTRALINE HCL 50 MG PO TABS: 50.0000 mg | ORAL_TABLET | Freq: Every day | ORAL | 3 refills | Status: DC

## 2018-11-17 MED ORDER — LORAZEPAM 1 MG PO TABS: 1.0000 mg | ORAL_TABLET | Freq: Every day | ORAL | 0 refills | Status: DC | PRN

## 2018-11-17 NOTE — Progress Notes (Signed)
Called patient to triage for appointment. Patient states since 07/05/2018 he has anxiety with this COVID. Patient states he fell 3 days ago hitting his head on the tub. Patient states he wants to restart his medication. I was unable to complete the depression screening questionnaire because the patient became upset.

## 2018-11-17 NOTE — Progress Notes (Addendum)
Telemedicine Encounter- SOAP NOTE Established Patient  This telephone encounter was conducted with the patient's (or proxy's) verbal consent via audio telecommunications: yes/no: Yes Patient was instructed to have this encounter in a suitably private space; and to only have persons present to whom they give permission to participate. In addition, patient identity was confirmed by use of name plus two identifiers (DOB and address).  I discussed the limitations, risks, security and privacy concerns of performing an evaluation and management service by telephone and the availability of in person appointments. I also discussed with the patient that there may be a patient responsible charge related to this service. The patient expressed understanding and agreed to proceed.     No chief complaint on file. Anxiety  Subjective   Ronald [name] is a 38 y.o. male established patient. Telephone visit today for evaluation of anxiety.  Used to see Dr. Brigitte Pulse.  First visit with me.  Patient has extensive psychiatric history with multiple psychiatric diagnoses.  Presently not under the care of a psychiatrist.  Recently moved back to [place] from [place] where he was getting treatment with medicinal marijuana.  Recently seen in the emergency room after a fall also complaining of anxiety.  Was given prescription for lorazepam.  Requesting anxiety medication.  Does not accept psychiatric diagnoses on the chart.  States he only suffers from PTSD/anxiety disorder.  COVID pandemic making his symptoms worse.  HPI   Patient Active Problem List   Diagnosis Date Noted  . Schizophrenia, paranoid type ([place]) 12/23/2016  . PTSD (post-traumatic stress disorder) 12/15/2016  . Bipolar I disorder, single manic episode, severe with psychotic features ([place]) 09/29/2015  . Mood disorder ([place]) 09/22/2015  . Disorder of pineal gland     Past Medical History:  Diagnosis Date  . Anxiety   . Arthritis   . Disorder  of pineal gland   . Post traumatic stress disorder     Current Outpatient Medications  Medication Sig Dispense Refill  . clonazePAM (KLONOPIN) 1 MG tablet Take 0.5 tablets (0.5 mg total) by mouth 2 (two) times daily as needed for anxiety. 30 tablet 2  . ibuprofen (ADVIL,MOTRIN) 800 MG tablet Take 1 tablet (800 mg total) by mouth 3 (three) times daily. 21 tablet 0  . LORazepam (ATIVAN) 1 MG tablet Take 1 tablet (1 mg total) by mouth 3 (three) times daily as needed for anxiety. 9 tablet 0  . ondansetron (ZOFRAN ODT) 4 MG disintegrating tablet Take 1 tablet (4 mg total) by mouth every 8 (eight) hours as needed for nausea or vomiting. 20 tablet 0  . UNABLE TO FIND Med Name: CBD OIL    . UNABLE TO FIND Med Name: C-60 (Carbon supplement for healing)    . 5-Hydroxytryptophan (5-HTP) 50 MG CAPS Take 1 tablet by mouth at bedtime. (Patient not taking: Reported on 11/17/2018) 30 each   . acetaminophen (TYLENOL) 325 MG tablet Take 2 tablets (650 mg total) by mouth every 6 (six) hours as needed. (Patient not taking: Reported on 11/17/2018) 21 tablet 0  . ARIPiprazole (ABILIFY) 15 MG tablet Take 1 tablet (15 mg total) by mouth at bedtime. (Patient not taking: Reported on 11/17/2018) 90 tablet 3  . budesonide (RHINOCORT AQUA) 32 MCG/ACT nasal spray Place 2 sprays into both nostrils daily. (Patient not taking: Reported on 11/17/2018) 1 Bottle 1  . Melatonin 10 MG CAPS Take 10 mg by mouth at bedtime. (Patient not taking: Reported on 11/17/2018)    . nicotine polacrilex (  NICORETTE) 4 MG gum Take by mouth.    . traZODone (DESYREL) 50 MG tablet Take 1 tablet (50 mg total) by mouth at bedtime as needed for sleep. (Patient not taking: Reported on 11/17/2018) 30 tablet 5   No current facility-administered medications for this visit.     Allergies  Allergen Reactions  . Benadryl [Diphenhydramine]     itching  . Penicillins     Mother states he's not allergic to this  . Hydroxyzine Anxiety    Social History    Socioeconomic History  . Marital status: Single    Spouse name: Not on file  . Number of children: Not on file  . Years of education: Not on file  . Highest education level: Not on file  Occupational History  . Not on file  Social Needs  . Financial resource strain: Not on file  . Food insecurity    Worry: Not on file    Inability: Not on file  . Transportation needs    Medical: Not on file    Non-medical: Not on file  Tobacco Use  . Smoking status: Never Smoker  . Smokeless tobacco: Current User    Types: Chew  Substance and Sexual Activity  . Alcohol use: No  . Drug use: No    Comment: one year ago  . Sexual activity: Never  Lifestyle  . Physical activity    Days per week: Not on file    Minutes per session: Not on file  . Stress: Not on file  Relationships  . Social Musicianconnections    Talks on phone: Not on file    Gets together: Not on file    Attends religious service: Not on file    Active member of club or organization: Not on file    Attends meetings of clubs or organizations: Not on file    Relationship status: Not on file  . Intimate partner violence    Fear of current or ex partner: Not on file    Emotionally abused: Not on file    Physically abused: Not on file    Forced sexual activity: Not on file  Other Topics Concern  . Not on file  Social History Narrative  . Not on file    Review of Systems  Constitutional: Negative.  Negative for chills and fever.  HENT: Negative for congestion and sore throat.   Respiratory: Negative for cough and shortness of breath.   Cardiovascular: Negative for chest pain and palpitations.  Gastrointestinal: Negative for abdominal pain, diarrhea, nausea and vomiting.  Skin: Negative.  Negative for rash.  Neurological: Negative for dizziness and headaches.  Psychiatric/Behavioral: The patient is nervous/anxious.     Objective  Alert and oriented x3 in no apparent respiratory distress. Vitals as reported by the patient:  There were no vitals filed for this visit.  There are no diagnoses linked to this encounter. Diagnoses and all orders for this visit:  Generalized anxiety disorder -     sertraline (ZOLOFT) 50 MG tablet; Take 1 tablet (50 mg total) by mouth daily. -     Ambulatory referral to Psychiatry  PTSD (post-traumatic stress disorder) -     Ambulatory referral to Psychiatry  Situational anxiety -     LORazepam (ATIVAN) 1 MG tablet; Take 1 tablet (1 mg total) by mouth daily as needed for anxiety.   Psychiatric diagnoses discussed with patient.  Will be referred to psychiatry.  I made patient aware that I will not be his  psychiatric primary care physician like Dr. Clelia CroftShaw was.  I will prescribe antianxiety medication this time only.  He understands and agrees with referral.  I discussed the assessment and treatment plan with the patient. The patient was provided an opportunity to ask questions and all were answered. The patient agreed with the plan and demonstrated an understanding of the instructions.   The patient was advised to call back or seek an in-person evaluation if the symptoms worsen or if the condition fails to improve as anticipated.  Time spent: 15 minutes.  Georgina QuintMiguel Jose Vikash Nest, MD  Primary Care at [facility] [place]

## 2018-11-18 ENCOUNTER — Telehealth: Payer: Self-pay | Admitting: Emergency Medicine

## 2018-11-19 ENCOUNTER — Emergency Department (HOSPITAL_COMMUNITY)
Admission: EM | Admit: 2018-11-19 | Discharge: 2018-11-20 | Disposition: A | Discharge status: Home / Self Care | Payer: Self-pay | Attending: Emergency Medicine | Admitting: Emergency Medicine

## 2018-11-19 ENCOUNTER — Encounter (HOSPITAL_COMMUNITY): Payer: Self-pay | Admitting: Emergency Medicine

## 2018-11-19 ENCOUNTER — Emergency Department (HOSPITAL_COMMUNITY): Payer: Self-pay

## 2018-11-19 ENCOUNTER — Other Ambulatory Visit: Payer: Self-pay

## 2018-11-19 ENCOUNTER — Ambulatory Visit: Payer: Self-pay | Admitting: Emergency Medicine

## 2018-11-19 DIAGNOSIS — S90412A Abrasion, left great toe, initial encounter: Secondary | ICD-10-CM | POA: Insufficient documentation

## 2018-11-19 DIAGNOSIS — F319 Bipolar disorder, unspecified: Secondary | ICD-10-CM | POA: Insufficient documentation

## 2018-11-19 DIAGNOSIS — Y929 Unspecified place or not applicable: Secondary | ICD-10-CM | POA: Insufficient documentation

## 2018-11-19 DIAGNOSIS — F200 Paranoid schizophrenia: Secondary | ICD-10-CM | POA: Insufficient documentation

## 2018-11-19 DIAGNOSIS — Y999 Unspecified external cause status: Secondary | ICD-10-CM | POA: Insufficient documentation

## 2018-11-19 DIAGNOSIS — W25XXXA Contact with sharp glass, initial encounter: Secondary | ICD-10-CM | POA: Insufficient documentation

## 2018-11-19 DIAGNOSIS — F419 Anxiety disorder, unspecified: Secondary | ICD-10-CM | POA: Insufficient documentation

## 2018-11-19 DIAGNOSIS — Y939 Activity, unspecified: Secondary | ICD-10-CM | POA: Insufficient documentation

## 2018-11-19 DIAGNOSIS — F4312 Post-traumatic stress disorder, chronic: Secondary | ICD-10-CM | POA: Insufficient documentation

## 2018-11-19 DIAGNOSIS — S90411A Abrasion, right great toe, initial encounter: Secondary | ICD-10-CM | POA: Insufficient documentation

## 2018-11-19 DIAGNOSIS — M25531 Pain in right wrist: Secondary | ICD-10-CM | POA: Insufficient documentation

## 2018-11-19 DIAGNOSIS — F1910 Other psychoactive substance abuse, uncomplicated: Secondary | ICD-10-CM | POA: Insufficient documentation

## 2018-11-19 LAB — CBC WITH DIFFERENTIAL/PLATELET
Abs Immature Granulocytes: 0.05 10*3/uL (ref 0.00–0.07)
Basophils Absolute: 0.1 10*3/uL (ref 0.0–0.1)
Basophils Relative: 0 %
Eosinophils Absolute: 0 10*3/uL (ref 0.0–0.5)
Eosinophils Relative: 0 %
HCT: 44.2 % (ref 39.0–52.0)
Hemoglobin: 15 g/dL (ref 13.0–17.0)
Immature Granulocytes: 0 %
Lymphocytes Relative: 11 %
Lymphs Abs: 1.5 10*3/uL (ref 0.7–4.0)
MCH: 30.6 pg (ref 26.0–34.0)
MCHC: 33.9 g/dL (ref 30.0–36.0)
MCV: 90.2 fL (ref 80.0–100.0)
Monocytes Absolute: 0.9 10*3/uL (ref 0.1–1.0)
Monocytes Relative: 6 %
Neutro Abs: 11.9 10*3/uL — ABNORMAL HIGH (ref 1.7–7.7)
Neutrophils Relative %: 83 %
Platelets: 263 10*3/uL (ref 150–400)
RBC: 4.9 MIL/uL (ref 4.22–5.81)
RDW: 12.5 % (ref 11.5–15.5)
WBC: 14.4 10*3/uL — ABNORMAL HIGH (ref 4.0–10.5)
nRBC: 0 % (ref 0.0–0.2)

## 2018-11-19 LAB — RAPID URINE DRUG SCREEN, HOSP PERFORMED
Amphetamines: NOT DETECTED
Barbiturates: NOT DETECTED
Benzodiazepines: POSITIVE — AB
Cocaine: NOT DETECTED
Opiates: NOT DETECTED
Tetrahydrocannabinol: POSITIVE — AB

## 2018-11-19 LAB — COMPREHENSIVE METABOLIC PANEL
ALT: 21 U/L (ref 0–44)
AST: 41 U/L (ref 15–41)
Albumin: 4.6 g/dL (ref 3.5–5.0)
Alkaline Phosphatase: 53 U/L (ref 38–126)
Anion gap: 12 (ref 5–15)
BUN: 12 mg/dL (ref 6–20)
CO2: 25 mmol/L (ref 22–32)
Calcium: 8.8 mg/dL — ABNORMAL LOW (ref 8.9–10.3)
Chloride: 100 mmol/L (ref 98–111)
Creatinine, Ser: 1.49 mg/dL — ABNORMAL HIGH (ref 0.61–1.24)
GFR calc Af Amer: >60 mL/min (ref >60)
GFR calc non Af Amer: 59 mL/min — ABNORMAL LOW (ref >60)
Glucose, Bld: 99 mg/dL (ref 70–99)
Potassium: 4.5 mmol/L (ref 3.5–5.1)
Sodium: 137 mmol/L (ref 135–145)
Total Bilirubin: 1.7 mg/dL — ABNORMAL HIGH (ref 0.3–1.2)
Total Protein: 7.3 g/dL (ref 6.5–8.1)

## 2018-11-19 LAB — ETHANOL: Alcohol, Ethyl (B): <10 mg/dL (ref <10)

## 2018-11-19 MED ORDER — LORAZEPAM 1 MG PO TABS
1.0000 mg | ORAL_TABLET | ORAL | Status: DC | PRN
  Administered 2018-11-20 (×2): 1 mg via ORAL
  Filled 2018-11-19 (×2): qty 1

## 2018-11-19 NOTE — Telephone Encounter (Signed)
Message sent to scheduling pool.  
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
## 2018-11-19 NOTE — ED Notes (Signed)
Call received from pt [place] [phone] requesting pt status update. Pt Mom is listed in pt chart as emergency contact. [employer]

## 2018-11-19 NOTE — ED Notes (Signed)
Patient provided with paper scrubs to change out of wet clothes and warm blanket.
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
## 2018-11-19 NOTE — ED Notes (Signed)
IVC paperwork has arrived. Officer present states in the last four days he has made 21 911 calls to the point he was going to be charged with misuse of 911 but was not at this time. He is living with his mom in a rual type area and has been calling stating he is seeing things in the woods but nothing found when the [place] deputies arrive.

## 2018-11-19 NOTE — ED Notes (Signed)
Patient and belongings wanded by security. One labeled patient belongings bag transferred with patient.

## 2018-11-20 ENCOUNTER — Ambulatory Visit: Payer: Self-pay

## 2018-11-20 MED ORDER — STERILE WATER FOR INJECTION IJ SOLN
INTRAMUSCULAR | Status: AC
  Administered 2018-11-20: 05:00:00 1.2 mL
  Filled 2018-11-20: qty 10

## 2018-11-20 MED ORDER — ONDANSETRON HCL 4 MG PO TABS: 4.0000 mg | ORAL_TABLET | Freq: Three times a day (TID) | ORAL | Status: DC | PRN

## 2018-11-20 MED ORDER — ZIPRASIDONE MESYLATE 20 MG IM SOLR: 20.0000 mg | Freq: Once | INTRAMUSCULAR | Status: DC

## 2018-11-20 MED ORDER — IBUPROFEN 200 MG PO TABS
600.0000 mg | ORAL_TABLET | Freq: Three times a day (TID) | ORAL | Status: DC | PRN
  Administered 2018-11-20: 600 mg via ORAL
  Filled 2018-11-20: qty 3

## 2018-11-20 MED ORDER — GABAPENTIN 300 MG PO CAPS: 300.0000 mg | ORAL_CAPSULE | Freq: Three times a day (TID) | ORAL | Status: DC

## 2018-11-20 MED ORDER — NICOTINE 21 MG/24HR TD PT24
21.0000 mg | MEDICATED_PATCH | Freq: Every day | TRANSDERMAL | Status: DC
  Administered 2018-11-20 (×2): 21 mg via TRANSDERMAL
  Filled 2018-11-20 (×2): qty 1

## 2018-11-20 MED ORDER — ZIPRASIDONE MESYLATE 20 MG IM SOLR
INTRAMUSCULAR | Status: AC
  Administered 2018-11-20: 20 mg
  Filled 2018-11-20: qty 20
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
## 2018-11-20 NOTE — Discharge Instructions (Signed)
For your mental health needs, you are advised to follow up with [facility].  Call them at your earliest opportunity to schedule an intake appointment: ° °     [facility] °     [address] °     [place], [place] [address] °     [phone] °     Crisis number: [phone] °

## 2018-11-20 NOTE — ED Notes (Addendum)
At time of discharge pt was not cooperative with discharge instructions or VS. [facility] took pt's belongings and discharge instructions.

## 2018-11-20 NOTE — ED Notes (Signed)
IT consultant returned to unit to Artist that charges in fact had been taken out on patient, four charges and the [place] office should be notified when patient is discharged.

## 2018-11-20 NOTE — ED Notes (Signed)
While a patient here pt was not delusional, not responding to internal stimuli. He was anxious, irritable and demanding. Never mentioned SI/HI or AVH.

## 2018-11-20 NOTE — ED Notes (Signed)
Staff report that pt sweating. This Probation officer spoke with pt and tried to find out if he is needing some medication for detox or something else, and pt became very angry with this Probation officer .  He is adamant that the only thing he is detoxing from is nicotine. He said that withdrawal from nicotine causes his sweats. Currently he is sleeping.
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
## 2018-11-20 NOTE — Telephone Encounter (Signed)
He is having a mental breakdown.  Hear things seeing things Mother would not complete call.  Hung up the phone.  Mother states that she has got to get him some help. States that she is going to take him to [facility].Could not completely triage.

## 2018-11-20 NOTE — ED Notes (Signed)
Pt discharged and [employer] came to arrest him at time of discharge. Pt was not cooperative with the [employer] or the discharge. [employer] put pt in restraints. No injuries noted. He was transported in a wheelchair, yelling all the way.

## 2018-11-20 NOTE — ED Notes (Addendum)
Pt yelling at staff because he is hearing voices and think people are talking about him. Pt was reassured no one was talking about him. Pt jumped up towards Probation officer asking for Dealer. [employer] stepped in and redirected pt to his bed.

## 2018-11-20 NOTE — ED Notes (Signed)
Pt comes up to the nurse's station proselytizing about politics, the dangers of [employer]. Demanded a copy of patient's rights. Makes trips to the nurse's station frequently.

## 2018-11-20 NOTE — BH Assessment (Signed)
[facility] Assessment Progress Note  Per Corena Pilgrim, MD, this pt does not require psychiatric hospitalization at this time.  Pt presents under IVC initiated by law enforcement, and upheld by EDP Madalyn Rob, MD, which Dr Darleene Cleaver has rescinded.  Pt is to be discharged from [facility] with recommendation to follow up with [facility].  This has been included in pt's discharge instructions.  Pt's nurse, Diane, has been notified.  Jalene Mullet, [place] Triage Specialist [phone]

## 2018-11-20 NOTE — ED Notes (Signed)
Pt woke up. Continues to be angry with this Probation officer because he feels that this Probation officer insinuated that he might be detoxing from something other than nicotine due to perfuse sweating. He accepted a nicotine patch this time.  He wanted another nurse, but another nurse is not available. He believes that he might be sweating because of medication given to him last night.

## 2018-11-21 ENCOUNTER — Encounter (HOSPITAL_COMMUNITY): Payer: Self-pay | Admitting: Emergency Medicine

## 2018-11-21 ENCOUNTER — Encounter (HOSPITAL_COMMUNITY): Payer: Self-pay

## 2018-11-21 ENCOUNTER — Other Ambulatory Visit: Payer: Self-pay

## 2018-11-21 ENCOUNTER — Emergency Department (HOSPITAL_COMMUNITY): Admission: EM | Admit: 2018-11-21 | Discharge: 2018-11-21 | Discharge status: Against Medical Advice | Payer: Self-pay

## 2018-11-21 ENCOUNTER — Emergency Department (HOSPITAL_COMMUNITY): Payer: Self-pay

## 2018-11-21 ENCOUNTER — Emergency Department (HOSPITAL_COMMUNITY)
Admission: EM | Admit: 2018-11-21 | Discharge: 2018-11-21 | Disposition: A | Discharge status: Home / Self Care | Payer: Self-pay | Attending: Emergency Medicine | Admitting: Emergency Medicine

## 2018-11-21 ENCOUNTER — Emergency Department (HOSPITAL_BASED_OUTPATIENT_CLINIC_OR_DEPARTMENT_OTHER): Admission: EM | Admit: 2018-11-21 | Discharge: 2018-11-21 | Discharge status: Absent without leave | Payer: Self-pay

## 2018-11-21 ENCOUNTER — Emergency Department (HOSPITAL_COMMUNITY)
Admission: EM | Admit: 2018-11-21 | Discharge: 2018-11-21 | Discharge status: Against Medical Advice | Payer: Self-pay | Attending: Emergency Medicine | Admitting: Emergency Medicine

## 2018-11-21 DIAGNOSIS — M25532 Pain in left wrist: Secondary | ICD-10-CM | POA: Insufficient documentation

## 2018-11-21 DIAGNOSIS — K5900 Constipation, unspecified: Secondary | ICD-10-CM | POA: Insufficient documentation

## 2018-11-21 DIAGNOSIS — R1110 Vomiting, unspecified: Secondary | ICD-10-CM | POA: Insufficient documentation

## 2018-11-21 DIAGNOSIS — F17220 Nicotine dependence, chewing tobacco, uncomplicated: Secondary | ICD-10-CM | POA: Insufficient documentation

## 2018-11-21 DIAGNOSIS — J029 Acute pharyngitis, unspecified: Secondary | ICD-10-CM

## 2018-11-21 DIAGNOSIS — M79675 Pain in left toe(s): Secondary | ICD-10-CM | POA: Insufficient documentation

## 2018-11-21 DIAGNOSIS — M25531 Pain in right wrist: Secondary | ICD-10-CM | POA: Insufficient documentation

## 2018-11-21 DIAGNOSIS — R070 Pain in throat: Secondary | ICD-10-CM | POA: Insufficient documentation

## 2018-11-21 DIAGNOSIS — R5383 Other fatigue: Secondary | ICD-10-CM | POA: Insufficient documentation

## 2018-11-21 DIAGNOSIS — Z5321 Procedure and treatment not carried out due to patient leaving prior to being seen by health care provider: Secondary | ICD-10-CM | POA: Insufficient documentation

## 2018-11-21 DIAGNOSIS — M79674 Pain in right toe(s): Secondary | ICD-10-CM | POA: Insufficient documentation

## 2018-11-21 DIAGNOSIS — Z20828 Contact with and (suspected) exposure to other viral communicable diseases: Secondary | ICD-10-CM | POA: Insufficient documentation

## 2018-11-21 LAB — COMPREHENSIVE METABOLIC PANEL
ALT: 30 U/L (ref 0–44)
AST: 46 U/L — ABNORMAL HIGH (ref 15–41)
Albumin: 4.4 g/dL (ref 3.5–5.0)
Alkaline Phosphatase: 56 U/L (ref 38–126)
Anion gap: 10 (ref 5–15)
BUN: 10 mg/dL (ref 6–20)
CO2: 26 mmol/L (ref 22–32)
Calcium: 9 mg/dL (ref 8.9–10.3)
Chloride: 104 mmol/L (ref 98–111)
Creatinine, Ser: 1.33 mg/dL — ABNORMAL HIGH (ref 0.61–1.24)
GFR calc Af Amer: >60 mL/min (ref >60)
GFR calc non Af Amer: >60 mL/min (ref >60)
Glucose, Bld: 105 mg/dL — ABNORMAL HIGH (ref 70–99)
Potassium: 3.7 mmol/L (ref 3.5–5.1)
Sodium: 140 mmol/L (ref 135–145)
Total Bilirubin: 1 mg/dL (ref 0.3–1.2)
Total Protein: 6.9 g/dL (ref 6.5–8.1)

## 2018-11-21 LAB — CBC WITH DIFFERENTIAL/PLATELET
Abs Immature Granulocytes: 0.04 10*3/uL (ref 0.00–0.07)
Basophils Absolute: 0 10*3/uL (ref 0.0–0.1)
Basophils Relative: 0 %
Eosinophils Absolute: 0 10*3/uL (ref 0.0–0.5)
Eosinophils Relative: 0 %
HCT: 41.7 % (ref 39.0–52.0)
Hemoglobin: 14.3 g/dL (ref 13.0–17.0)
Immature Granulocytes: 1 %
Lymphocytes Relative: 19 %
Lymphs Abs: 1.4 10*3/uL (ref 0.7–4.0)
MCH: 30.8 pg (ref 26.0–34.0)
MCHC: 34.3 g/dL (ref 30.0–36.0)
MCV: 89.9 fL (ref 80.0–100.0)
Monocytes Absolute: 0.7 10*3/uL (ref 0.1–1.0)
Monocytes Relative: 9 %
Neutro Abs: 5.4 10*3/uL (ref 1.7–7.7)
Neutrophils Relative %: 71 %
Platelets: 230 10*3/uL (ref 150–400)
RBC: 4.64 MIL/uL (ref 4.22–5.81)
RDW: 12.6 % (ref 11.5–15.5)
WBC: 7.5 10*3/uL (ref 4.0–10.5)
nRBC: 0 % (ref 0.0–0.2)

## 2018-11-21 LAB — LIPASE, BLOOD: Lipase: 76 U/L — ABNORMAL HIGH (ref 11–51)

## 2018-11-21 LAB — GROUP A STREP BY PCR: Group A Strep by PCR: NOT DETECTED

## 2018-11-21 MED ORDER — LORAZEPAM 1 MG PO TABS
1.0000 mg | ORAL_TABLET | Freq: Once | ORAL | Status: DC
  Filled 2018-11-21: qty 1

## 2018-11-21 MED ORDER — ACETAMINOPHEN 325 MG PO TABS
650.0000 mg | ORAL_TABLET | Freq: Once | ORAL | Status: DC
  Filled 2018-11-21: qty 2

## 2018-11-21 NOTE — ED Triage Notes (Signed)
Pt presents with c/o sore throat for the last 3 days and was here earlier and left due to anxiety and has not taken any anxiety medications that are prescribed. Pt is currently not tachycardic and no acute distress.

## 2018-11-21 NOTE — ED Notes (Signed)
Pt had been instructed to stay in room multiple times, he was noncom;liant, tried to leave without pants and shoes on then decided to stay, returned to room, put clothes on and proceeded to leave department. He was noted to be seen getting into a car in the parking lot. Spoke with Cristie Hem PA who is aware and put pt as an elopement.

## 2018-11-21 NOTE — ED Triage Notes (Signed)
Pt presents with c/o sore throat, fatigue for the past 3 days. Pt was recently incarcerated within the last few days. Pt is unsure as to whether he has been around anyone with Covid symptoms.

## 2018-11-21 NOTE — ED Notes (Signed)
Pt left from department after yelling and screaming at staff to give him lorazepam immediately and demanding food before being seen by provider. Security at bedside with pt when pt was yelling.

## 2018-11-21 NOTE — ED Notes (Signed)
While entering the room, the patient stepped out of the room fully dressed and stated that "I just want to be with my Mom now."Pt was advised that this RN has pain and anxiety medications for him. Pt stated that he just wants to leave. Security followed  observed by security getting into the passenger seat of a vehicle that was parked outside the ED entrance. Pt ambulated through the department w/o difficulty and was in no distress. Cristie Hem, [place] notified
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
## 2018-11-22 DIAGNOSIS — F3113 Bipolar disorder, current episode manic without psychotic features, severe: Secondary | ICD-10-CM | POA: Insufficient documentation

## 2018-11-22 DIAGNOSIS — F312 Bipolar disorder, current episode manic severe with psychotic features: Secondary | ICD-10-CM | POA: Insufficient documentation

## 2018-11-22 LAB — SARS CORONAVIRUS 2 (TAT 6-24 HRS): SARS Coronavirus 2: NEGATIVE

## 2018-11-23 DIAGNOSIS — F1210 Cannabis abuse, uncomplicated: Secondary | ICD-10-CM | POA: Diagnosis present

## 2018-11-23 NOTE — Telephone Encounter (Signed)
  Reason for Disposition . Hysterical or combative behavior  Protocols used: ANXIETY AND PANIC ATTACK-A-AH

## 2018-11-24 NOTE — Telephone Encounter (Signed)
Noted  

## 2019-01-29 ENCOUNTER — Other Ambulatory Visit: Payer: Self-pay

## 2019-01-29 ENCOUNTER — Emergency Department (HOSPITAL_COMMUNITY)
Admission: EM | Admit: 2019-01-29 | Discharge: 2019-01-30 | Disposition: A | Discharge status: Home / Self Care | Payer: Self-pay | Attending: Emergency Medicine | Admitting: Emergency Medicine

## 2019-01-29 ENCOUNTER — Emergency Department (HOSPITAL_COMMUNITY)
Admission: EM | Admit: 2019-01-29 | Discharge: 2019-01-29 | Disposition: A | Discharge status: Home / Self Care | Payer: Self-pay | Attending: Emergency Medicine | Admitting: Emergency Medicine

## 2019-01-29 ENCOUNTER — Emergency Department (HOSPITAL_COMMUNITY): Payer: Self-pay

## 2019-01-29 ENCOUNTER — Encounter (HOSPITAL_COMMUNITY): Payer: Self-pay | Admitting: Emergency Medicine

## 2019-01-29 ENCOUNTER — Encounter (HOSPITAL_COMMUNITY): Payer: Self-pay

## 2019-01-29 DIAGNOSIS — M791 Myalgia, unspecified site: Secondary | ICD-10-CM | POA: Insufficient documentation

## 2019-01-29 DIAGNOSIS — Y999 Unspecified external cause status: Secondary | ICD-10-CM | POA: Insufficient documentation

## 2019-01-29 DIAGNOSIS — Z5321 Procedure and treatment not carried out due to patient leaving prior to being seen by health care provider: Secondary | ICD-10-CM | POA: Insufficient documentation

## 2019-01-29 DIAGNOSIS — Z79899 Other long term (current) drug therapy: Secondary | ICD-10-CM | POA: Insufficient documentation

## 2019-01-29 DIAGNOSIS — Y939 Activity, unspecified: Secondary | ICD-10-CM | POA: Insufficient documentation

## 2019-01-29 DIAGNOSIS — F209 Schizophrenia, unspecified: Secondary | ICD-10-CM | POA: Insufficient documentation

## 2019-01-29 DIAGNOSIS — M25571 Pain in right ankle and joints of right foot: Secondary | ICD-10-CM | POA: Insufficient documentation

## 2019-01-29 DIAGNOSIS — R6883 Chills (without fever): Secondary | ICD-10-CM | POA: Insufficient documentation

## 2019-01-29 DIAGNOSIS — Y929 Unspecified place or not applicable: Secondary | ICD-10-CM | POA: Insufficient documentation

## 2019-01-29 DIAGNOSIS — X58XXXA Exposure to other specified factors, initial encounter: Secondary | ICD-10-CM | POA: Insufficient documentation

## 2019-01-29 DIAGNOSIS — F419 Anxiety disorder, unspecified: Secondary | ICD-10-CM | POA: Insufficient documentation

## 2019-01-29 NOTE — ED Triage Notes (Signed)
Patient states he woke this Am having chills, body aches. Patient denies any fever.

## 2019-01-29 NOTE — ED Triage Notes (Signed)
Patient is complaining of right ankle that was injured two years ago and now he has been working out and thinks he re injured it. Patient states it started hurting yesterday. Patient states he does not want any pain medication that he wants to make sure he did not re injure it.

## 2019-01-30 NOTE — ED Provider Notes (Signed)
Emergency Department Provider Note   I have reviewed the triage vital signs and the nursing notes.   HISTORY  Chief Complaint Ankle Injury   HPI Ronald [name] is a 38 y.o. male who says that he is here because of right ankle pain.  Patient states he has had surgeries there in the past and thinks that maybe the cold is made it hurt worse.  X-ray ordered prior to my evaluation.   Ultimately the patient mother shows up and states that she brought him in because of his anxiety and not able to sleep very well.  He is on Haldol and Klonopin for the same.  Patient states that it seems to help but not quite enough to make him sleep at night and last night he did not sleep for multiple nights in a row he ended up having a mental breakdown and had to be hospitalized for the same.  He has no auditory or visual hallucinations right now.  He is not suicidal or homicidal.   No other associated or modifying symptoms.    Past Medical History:  Diagnosis Date  . Anxiety   . Arthritis   . Disorder of pineal gland   . Post traumatic stress disorder     Patient Active Problem List   Diagnosis Date Noted  . Schizophrenia, paranoid type (HCC) 12/23/2016  . PTSD (post-traumatic stress disorder) 12/15/2016  . Bipolar I disorder, single manic episode, severe with psychotic features (HCC) 09/29/2015  . Mood disorder (HCC) 09/22/2015  . Disorder of pineal gland     Past Surgical History:  Procedure Laterality Date  . ANKLE SURGERY      Current Outpatient Rx  . Order #: [id number] Class: Normal  . Order #: [id number] Class: OTC  . Order #: [id number] Class: Normal  . Order #: [id number] Class: Normal  . Order #: [id number] Class: OTC  . Order #: [id number] Class: OTC    Allergies Hydroxyzine, Benadryl [diphenhydramine], Other, and Penicillins  History reviewed. No pertinent family history.  Social History Social History   Tobacco Use  . Smoking status: Never Smoker  . Smokeless tobacco:  Current User    Types: Chew  Substance Use Topics  . Alcohol use: No  . Drug use: No    Comment: one year ago    Review of Systems  All other systems negative except as documented in the HPI. All pertinent positives and negatives as reviewed in the HPI. ____________________________________________   PHYSICAL EXAM:  VITAL SIGNS: ED Triage Vitals  Enc Vitals Group     BP 01/29/19 2320 (!) 142/100     Pulse Rate 01/29/19 2320 68     Resp 01/29/19 2320 16     Temp 01/29/19 2320 98.2 F (36.8 C)     Temp Source 01/29/19 2320 Oral     SpO2 01/29/19 2320 99 %     Weight 01/29/19 2320 175 lb (79.4 kg)     Height 01/29/19 2320 5\' 11"  (1.803 m)    Constitutional: Alert and oriented. Well appearing and in no acute distress. Eyes: Conjunctivae are normal. PERRL. EOMI. Head: Atraumatic. Nose: No congestion/rhinnorhea. Mouth/Throat: Mucous membranes are moist.  Oropharynx non-erythematous. Neck: No stridor.  No meningeal signs.   Cardiovascular: Normal rate, regular rhythm. Good peripheral circulation. Grossly normal heart sounds.   Respiratory: Normal respiratory effort.  No retractions. Lungs CTAB. Gastrointestinal: Soft and nontender. No distention.  Musculoskeletal: No lower extremity tenderness nor edema. No gross deformities of extremities. Neurologic:  Normal speech and language.  No gross focal neurologic deficits are appreciated.  Skin:  Skin is warm, dry and intact. No rash noted.   ____________________________________________   LABS (all labs ordered are listed, but only abnormal results are displayed)  Labs Reviewed - No data to display ____________________________________________  EKG   EKG Interpretation  Date/Time:    Ventricular Rate:    PR Interval:    QRS Duration:   QT Interval:    QTC Calculation:   R Axis:     Text Interpretation:         ____________________________________________  RADIOLOGY  Dg Ankle Complete Right  Result Date:  01/29/2019 CLINICAL DATA:  38 year old male right ankle injury. EXAM: RIGHT ANKLE - COMPLETE 3+ VIEW COMPARISON:  Right ankle radiograph dated 11/25/2016 FINDINGS: There is no acute fracture or dislocation. Anterior distal tibial fixation sideplate and screws as well as a screw through the medial malleolus similar to prior radiograph and appear intact. There is degenerative and sclerotic changes of the ankle. The soft tissues are unremarkable. IMPRESSION: 1. No acute fracture or dislocation. 2. Stable postoperative changes. Electronically Signed   By: Anner Crete M.D.   On: 01/29/2019 23:50    ____________________________________________   PROCEDURES  Procedure(s) performed:   Procedures   ____________________________________________   INITIAL IMPRESSION / ASSESSMENT AND PLAN / ED COURSE  Chronic ankle pain. No change.  Will take an extra haldol PRN for anxiety/sleep. Will d/w psych on Monday if not helping. Has appointment in a week. No indication for acute stabilization at this time.      Pertinent labs & imaging results that were available during my care of the patient were reviewed by me and considered in my medical decision making (see chart for details).  A medical screening exam was performed and I feel the patient has had an appropriate workup for their chief complaint at this time and likelihood of emergent condition existing is low. They have been counseled on decision, discharge, follow up and which symptoms necessitate immediate return to the emergency department. They or their family verbally stated understanding and agreement with plan and discharged in stable condition.   ____________________________________________  FINAL CLINICAL IMPRESSION(S) / ED DIAGNOSES  Final diagnoses:  Acute right ankle pain     MEDICATIONS GIVEN DURING THIS VISIT:  Medications - No data to display   NEW OUTPATIENT MEDICATIONS STARTED DURING THIS VISIT:  Discharge Medication  List as of 01/29/2019 11:59 PM      Note:  This note was prepared with assistance of Dragon voice recognition software. Occasional wrong-word or sound-a-like substitutions may have occurred due to the inherent limitations of voice recognition software.   Merrily Pew, MD 01/30/19 [phone]

## 2019-02-08 ENCOUNTER — Encounter: Payer: Self-pay | Admitting: Emergency Medicine

## 2019-02-08 ENCOUNTER — Other Ambulatory Visit: Payer: Self-pay

## 2019-02-08 ENCOUNTER — Ambulatory Visit (INDEPENDENT_AMBULATORY_CARE_PROVIDER_SITE_OTHER): Payer: Self-pay | Admitting: Emergency Medicine

## 2019-02-08 VITALS — BP 123/81 | HR 90 | Temp 98.6°F | Resp 16 | Ht 71.0 in | Wt 165.4 lb

## 2019-02-08 DIAGNOSIS — F4310 Post-traumatic stress disorder, unspecified: Secondary | ICD-10-CM

## 2019-02-08 NOTE — Progress Notes (Signed)
Ronald Montgomeryobert C Brester 37 y.o.   Chief Complaint  Patient presents with  . Anxiety    per patient wants medication review on medications    HISTORY OF PRESENT ILLNESS: This is a 38 y.o. male with history of PTSD on chronic anxiety here to discuss his medications. Used to see Dr. Clelia CroftShaw in the past.  Multiple psychiatric diagnoses but patient questions them. Telemedicine visit with me last August, first visit ever with me, referred to psychiatrist. Saw Dr. Allena KatzPatel on 11/30/2018 and started on multiple medications including Depakote, Haldol, Cogentin, and gabapentin.  Up until recently he was taking Zoloft and lorazepam.  Still taking lorazepam and asking me to refill those medications.  I told him it is up to his psychiatrist to take care of any psychiatric medications.  No other medical concerns today. [facility]: Discharge Summaries - documented in this encounter Orson EvaPatel, Raj Kirit, MD - 11/29/2018 10:41 AM EDT Formatting of this note might be different from the original. [facility] - Discharge Summary  Date of Admission: 11/22/2018 Date of Discharge: 11/29/2018 Attending Provider: Robb Mataraj Patel, MD Hospital LOS: 7 days  Time Spent performing discharge services: - 50 minutes  Discharge Diagnoses and Medications   Principal Problem: Bipolar disorder, current episode manic severe with psychotic features (HCC) Active Problems: PTSD (post-traumatic stress disorder) Mild tetrahydrocannabinol (THC) abuse GAD (generalized anxiety disorder)  Discharge Medications:    Medication List   START taking these medications  benztropine 1 MG tablet Commonly known as: COGENTIN Take 1 tablet (1 mg total) by mouth 2 times daily for 30 days.  divalproex ER 500 MG 24 hr tablet Commonly known as: DEPAKOTE ER Take 1 tablet (500 mg total) by mouth daily for 30 days. Start taking on: November 30, 2018  gabapentin 300 MG capsule Commonly known as: NEURONTIN Take 1 capsule (300 mg total)  by mouth 3 times daily for 30 days.  haloperidoL 5 MG tablet Commonly known as: HALDOL Take 1 tablet (5 mg total) by mouth 2 times daily for 30 days.   HPI   Prior to Admission medications   Medication Sig Start Date End Date Taking? Authorizing Provider  LORazepam (ATIVAN) 1 MG tablet Take 1 tablet (1 mg total) by mouth daily as needed for anxiety. 11/17/18  Yes Georgina QuintSagardia, Christyan Reger Jose, MD  UNABLE TO FIND Med Name: CBD OIL 12/23/16  Yes Sherren MochaShaw, Eva N, MD  UNABLE TO FIND Med Name: C-60 (Carbon supplement for healing) 12/23/16  Yes Sherren MochaShaw, Eva N, MD  Melatonin 10 MG CAPS Take 10 mg by mouth at bedtime. Patient not taking: Reported on 11/17/2018 12/23/16   Sherren MochaShaw, Eva N, MD  ondansetron (ZOFRAN ODT) 4 MG disintegrating tablet Take 1 tablet (4 mg total) by mouth every 8 (eight) hours as needed for nausea or vomiting. Patient not taking: Reported on 02/08/2019 11/15/18   Henderly, Britni A, PA-C  sertraline (ZOLOFT) 50 MG tablet Take 1 tablet (50 mg total) by mouth daily. Patient not taking: Reported on 02/08/2019 11/17/18   Georgina QuintSagardia, Yamin Swingler Jose, MD  budesonide (RHINOCORT AQUA) 32 MCG/ACT nasal spray Place 2 sprays into both nostrils daily. Patient not taking: Reported on 11/17/2018 08/22/16 01/29/19  Sherren MochaShaw, Eva N, MD    Allergies  Allergen Reactions  . Hydroxyzine Anxiety    Other reaction(s): Other "hyper "  . Benadryl [Diphenhydramine]     itching  . Other     All antihistamines  . Penicillins     Mother states he's not allergic to  this    Patient Active Problem List   Diagnosis Date Noted  . Schizophrenia, paranoid type ([place]) 12/23/2016  . PTSD (post-traumatic stress disorder) 12/15/2016  . Bipolar I disorder, single manic episode, severe with psychotic features ([place]) 09/29/2015  . Mood disorder ([place]) 09/22/2015  . Disorder of pineal gland     Past Medical History:  Diagnosis Date  . Anxiety   . Arthritis   . Disorder of pineal gland   . Post traumatic stress disorder     Past  Surgical History:  Procedure Laterality Date  . ANKLE SURGERY      Social History   Socioeconomic History  . Marital status: Single    Spouse name: Not on file  . Number of children: Not on file  . Years of education: Not on file  . Highest education level: Not on file  Occupational History  . Not on file  Social Needs  . Financial resource strain: Not on file  . Food insecurity    Worry: Not on file    Inability: Not on file  . Transportation needs    Medical: Not on file    Non-medical: Not on file  Tobacco Use  . Smoking status: Never Smoker  . Smokeless tobacco: Current User    Types: Chew  Substance and Sexual Activity  . Alcohol use: No  . Drug use: No    Comment: one year ago  . Sexual activity: Never  Lifestyle  . Physical activity    Days per week: Not on file    Minutes per session: Not on file  . Stress: Not on file  Relationships  . Social Herbalist on phone: Not on file    Gets together: Not on file    Attends religious service: Not on file    Active member of club or organization: Not on file    Attends meetings of clubs or organizations: Not on file    Relationship status: Not on file  . Intimate partner violence    Fear of current or ex partner: Not on file    Emotionally abused: Not on file    Physically abused: Not on file    Forced sexual activity: Not on file  Other Topics Concern  . Not on file  Social History Narrative  . Not on file    History reviewed. No pertinent family history.   Review of Systems  Constitutional: Negative.  Negative for chills and fever.  HENT: Negative for congestion and sore throat.   Respiratory: Negative.  Negative for cough and shortness of breath.   Cardiovascular: Negative for chest pain and palpitations.  Gastrointestinal: Negative for diarrhea, nausea and vomiting.  Musculoskeletal: Negative for myalgias.  Neurological: Negative for dizziness and headaches.  All other systems reviewed  and are negative.  Today's Vitals   02/08/19 1531  BP: 123/81  Pulse: 90  Resp: 16  Temp: 98.6 F (37 C)  TempSrc: Oral  SpO2: 97%  Weight: 165 lb 6.4 oz (75 kg)  Height: 5\' 11"  (1.803 m)   Body mass index is 23.07 kg/m.   Physical Exam Vitals signs reviewed.  Constitutional:      Appearance: Normal appearance.  HENT:     Head: Normocephalic.  Eyes:     Extraocular Movements: Extraocular movements intact.  Neck:     Musculoskeletal: Normal range of motion.  Cardiovascular:     Rate and Rhythm: Normal rate.  Pulmonary:     Effort:  Pulmonary effort is normal.  Musculoskeletal: Normal range of motion.  Neurological:     General: No focal deficit present.     Mental Status: He is alert and oriented to person, place, and time.  Psychiatric:        Mood and Affect: Mood normal.        Behavior: Behavior normal.      ASSESSMENT & PLAN: Filip was seen today for anxiety.  Diagnoses and all orders for this visit:  PTSD (post-traumatic stress disorder)  Referred back to his psychiatrist, Dr. Allena Katz.  Patient Instructions       If you have lab work done today you will be contacted with your lab results within the next 2 weeks.  If you have not heard from [place] then please contact us. The fastest way to get your results is to register for My Chart.   IF you received an x-ray today, you will receive an invoice from [facility]. Please contact [facility] at [phone] with questions or concerns regarding your invoice.   IF you received labwork today, you will receive an invoice from [facility]. Please contact [facility] at [phone] with questions or concerns regarding your invoice.   Our billing staff will not be able to assist you with questions regarding bills from these companies.  You will be contacted with the lab results as soon as they are available. The fastest way to get your results is to activate your My Chart account. Instructions are  located on the last page of this paperwork. If you have not heard from [place] regarding the results in 2 weeks, please contact this office.     Managing Post-Traumatic Stress Disorder If you have been diagnosed with post-traumatic stress disorder (PTSD), you may be relieved that you now know why you have felt or behaved a certain way. Still, you may feel overwhelmed about the treatment ahead. You may also wonder how to get the support you need and how to deal with the condition day-to-day. If you are living with PTSD, there are ways to help you recover from it and manage your symptoms. How to manage lifestyle changes Managing stress Stress is your body's reaction to life changes and events, both good and bad. Stress can make PTSD worse. Take the following steps to manage stress:  Talk with your health care provider or a counselor if you would like to learn more about techniques to reduce your stress. He or she may suggest some stress reduction techniques such as: ? Muscle relaxation exercises. ? Regular exercise. ? Meditation, yoga, or other mind-body exercises. ? Breathing exercises. ? Listening to quiet music. ? Spending time outside.  Maintain a healthy lifestyle. Eat a healthy diet, exercise regularly, get plenty of sleep, and take time to relax.  Spend time with others. Talk with them about how you are feeling and what kind of support you need. Try not to isolate yourself, even though you may feel like doing that. Isolating yourself can delay your recovery.  Do activities and hobbies that you enjoy.  Pace yourself when doing stressful things. Take breaks, and reward yourself when you finish. Make sure that you do not overload your schedule.  Medicines Your health care provider may suggest certain medicines if he or she feels that they will help to improve your condition. Medicines for depression (antidepressants) or severe loss of contact with reality (antipsychotics) may be used to  treat PTSD. Avoid using alcohol and other substances that may prevent your medicines  from working properly. It is also important to:  Talk with your pharmacist or health care provider about all medicines that you take, their possible side effects, and which medicines are safe to take together.  Make it your goal to take part in all treatment decisions (shared decision-making). Ask about possible side effects of medicines that your health care provider recommends, and tell him or her how you feel about having those side effects. It is best if shared decision-making with your health care provider is part of your total treatment plan. If your health care provider prescribes a medicine, you may not notice the full benefits of it for 4-8 weeks. Most people who are treated for PTSD need to take medicine for at least 6-12 months after they feel better. If you are taking medicines as part of your treatment, do not stop taking medicines before you ask your health care provider if it is safe to stop. You may need to have the medicine slowly decreased (tapered) over time to lower the risk of harmful side effects. Relationships Many people who have PTSD have difficulty trusting others. Make an effort to:  Take risks and develop trust with close friends and family members. Developing trust in others can help you feel safe and connect you with emotional support.  Be open and honest about your feelings.  Have fun and relax in safe spaces, such as with friends and family.  Think about going to couples counseling, family education classes, or family therapy. Your loved ones may not always know how to be supportive. Therapy can be helpful for everyone. How to recognize changes in your condition Be aware of your symptoms and how often you have them. The following symptoms mean that you need to seek help for your PTSD:  You feel suspicious and angry.  You have repeated flashbacks.  You avoid going out or being  with others.  You have an increasing number of fights with close friends or family members, such as your spouse.  You have thoughts about hurting yourself or others.  You cannot get relief from feelings of depression or anxiety. Follow these instructions at home: Lifestyle  Exercise regularly. Try to do 30 or more minutes of physical activity on most days of the week.  Try to get 7-9 hours of sleep each night. To help with sleep: ? Keep your bedroom cool and dark. ? Avoid screen time before bedtime. This means avoiding use of your TV, computer, tablet, and cell phone.  Practice self-soothing skills and use them daily.  Try to have fun and seek humor in your life. Eating and drinking  Do not eat a heavy meal during the hour before you go to bed.  Do not drink alcohol or caffeinated drinks before bed.  Avoid using alcohol or drugs. General instructions  If your PTSD is affecting your marriage or family, seek help from a family therapist.  Take over-the-counter and prescription medicines only as told by your health care provider.  Make sure to let all of your health care providers know that you have PTSD. This is especially important if you are having surgery or need to be admitted to the hospital.  Keep all follow-up visits as told by your health care providers. This is important. Where to find support Talking to others  Explain that PTSD is a mental health problem. It is something that a person can develop after experiencing or seeing a life-threatening event. Tell them that PTSD makes you feel stress  like you did during the event.  Talk to your loved ones about the symptoms you have. Also tell them what things or situations can cause symptoms to start (are triggers for you).  Assure your loved ones that there are treatments to help PTSD. Discuss possibly seeking family therapy or couples therapy.  If you are worried or fearful about seeking treatment, ask for support.   Keep daily contact with at least one trusted friend or family member. Finances Not all insurance plans cover mental health care, so it is important to check with your insurance carrier. If paying for co-pays or counseling services is a problem, search for a local or county mental health care center. Public mental health care services may be offered there at a low cost or no cost when you are not able to see a private health care provider. If you are a veteran, contact a local veterans organization or veterans hospital for more information. If you are taking medicine for PTSD, you may be able to get the genericform, which may be less expensive than brand-name medicine. Some makers of prescription medicines also offer help to patients who cannot afford the medicines that they need. Therapy and support groups  Find a support group in your community. Often, groups are available for [employer] veterans, trauma victims, and family members or caregivers.  Look into volunteer opportunities. Taking part in these can help you feel more connected to your community.  Contact a local organization to find out if you are eligible for a service dog. Where to find more information Go to this website to find more information about PTSD, treatment of PTSD, and how to get support:  [facility] for PTSD: www.ptsd.FitBoxer.tn Contact a health care provider if:  Your symptoms get worse or do not get better. Get help right away if:  You have thoughts about hurting yourself or others. If you ever feel like you may hurt yourself or others, or have thoughts about taking your own life, get help right away. You can go to your nearest emergency department or call:  Your local emergency services (911 in the [place]  A suicide crisis helpline, such as the National Suicide Prevention Lifeline at (703)419-3755. This is open 24-hours a day. Summary  If you are living with PTSD, there are ways to help you recover from it and  manage your symptoms.  Find supportive environments and people who understand PTSD. Spend time in those places, and maintain contact with those people.  Work with your health care team to create a plan for managing PTSD. The plan should include counseling, stress reduction techniques, and healthy lifestyle habits. This information is not intended to replace advice given to you by your health care provider. Make sure you discuss any questions you have with your health care provider. Document Released: 08/01/2016 Document Revised: 07/24/2018 Document Reviewed: 08/01/2016 Elsevier Patient Education  2020 [employer]      Edwina Barth, MD [facility]

## 2019-02-08 NOTE — Patient Instructions (Addendum)
If you have lab work done today you will be contacted with your lab results within the next 2 weeks.  If you have not heard from [place] then please contact us. The fastest way to get your results is to register for My Chart.   IF you received an x-ray today, you will receive an invoice from [facility]. Please contact [facility] at [phone] with questions or concerns regarding your invoice.   IF you received labwork today, you will receive an invoice from [place]. Please contact [facility] at [phone] with questions or concerns regarding your invoice.   Our billing staff will not be able to assist you with questions regarding bills from these companies.  You will be contacted with the lab results as soon as they are available. The fastest way to get your results is to activate your My Chart account. Instructions are located on the last page of this paperwork. If you have not heard from [place] regarding the results in 2 weeks, please contact this office.     Managing Post-Traumatic Stress Disorder If you have been diagnosed with post-traumatic stress disorder (PTSD), you may be relieved that you now know why you have felt or behaved a certain way. Still, you may feel overwhelmed about the treatment ahead. You may also wonder how to get the support you need and how to deal with the condition day-to-day. If you are living with PTSD, there are ways to help you recover from it and manage your symptoms. How to manage lifestyle changes Managing stress Stress is your body's reaction to life changes and events, both good and bad. Stress can make PTSD worse. Take the following steps to manage stress:  Talk with your health care provider or a counselor if you would like to learn more about techniques to reduce your stress. He or she may suggest some stress reduction techniques such as: ? Muscle relaxation exercises. ? Regular exercise. ? Meditation, yoga, or other mind-body  exercises. ? Breathing exercises. ? Listening to quiet music. ? Spending time outside.  Maintain a healthy lifestyle. Eat a healthy diet, exercise regularly, get plenty of sleep, and take time to relax.  Spend time with others. Talk with them about how you are feeling and what kind of support you need. Try not to isolate yourself, even though you may feel like doing that. Isolating yourself can delay your recovery.  Do activities and hobbies that you enjoy.  Pace yourself when doing stressful things. Take breaks, and reward yourself when you finish. Make sure that you do not overload your schedule.  Medicines Your health care provider may suggest certain medicines if he or she feels that they will help to improve your condition. Medicines for depression (antidepressants) or severe loss of contact with reality (antipsychotics) may be used to treat PTSD. Avoid using alcohol and other substances that may prevent your medicines from working properly. It is also important to:  Talk with your pharmacist or health care provider about all medicines that you take, their possible side effects, and which medicines are safe to take together.  Make it your goal to take part in all treatment decisions (shared decision-making). Ask about possible side effects of medicines that your health care provider recommends, and tell him or her how you feel about having those side effects. It is best if shared decision-making with your health care provider is part of your total treatment plan. If your health care provider prescribes a medicine, you may  not notice the full benefits of it for 4-8 weeks. Most people who are treated for PTSD need to take medicine for at least 6-12 months after they feel better. If you are taking medicines as part of your treatment, do not stop taking medicines before you ask your health care provider if it is safe to stop. You may need to have the medicine slowly decreased (tapered) over time  to lower the risk of harmful side effects. Relationships Many people who have PTSD have difficulty trusting others. Make an effort to:  Take risks and develop trust with close friends and family members. Developing trust in others can help you feel safe and connect you with emotional support.  Be open and honest about your feelings.  Have fun and relax in safe spaces, such as with friends and family.  Think about going to couples counseling, family education classes, or family therapy. Your loved ones may not always know how to be supportive. Therapy can be helpful for everyone. How to recognize changes in your condition Be aware of your symptoms and how often you have them. The following symptoms mean that you need to seek help for your PTSD:  You feel suspicious and angry.  You have repeated flashbacks.  You avoid going out or being with others.  You have an increasing number of fights with close friends or family members, such as your spouse.  You have thoughts about hurting yourself or others.  You cannot get relief from feelings of depression or anxiety. Follow these instructions at home: Lifestyle  Exercise regularly. Try to do 30 or more minutes of physical activity on most days of the week.  Try to get 7-9 hours of sleep each night. To help with sleep: ? Keep your bedroom cool and dark. ? Avoid screen time before bedtime. This means avoiding use of your TV, computer, tablet, and cell phone.  Practice self-soothing skills and use them daily.  Try to have fun and seek humor in your life. Eating and drinking  Do not eat a heavy meal during the hour before you go to bed.  Do not drink alcohol or caffeinated drinks before bed.  Avoid using alcohol or drugs. General instructions  If your PTSD is affecting your marriage or family, seek help from a family therapist.  Take over-the-counter and prescription medicines only as told by your health care provider.  Make  sure to let all of your health care providers know that you have PTSD. This is especially important if you are having surgery or need to be admitted to the hospital.  Keep all follow-up visits as told by your health care providers. This is important. Where to find support Talking to others  Explain that PTSD is a mental health problem. It is something that a person can develop after experiencing or seeing a life-threatening event. Tell them that PTSD makes you feel stress like you did during the event.  Talk to your loved ones about the symptoms you have. Also tell them what things or situations can cause symptoms to start (are triggers for you).  Assure your loved ones that there are treatments to help PTSD. Discuss possibly seeking family therapy or couples therapy.  If you are worried or fearful about seeking treatment, ask for support.  Keep daily contact with at least one trusted friend or family member. Finances Not all insurance plans cover mental health care, so it is important to check with your insurance carrier. If paying for co-pays  or counseling services is a problem, search for a local or county mental health care center. Public mental health care services may be offered there at a low cost or no cost when you are not able to see a private health care provider. If you are a veteran, contact a local veterans organization or veterans hospital for more information. If you are taking medicine for PTSD, you may be able to get the genericform, which may be less expensive than brand-name medicine. Some makers of prescription medicines also offer help to patients who cannot afford the medicines that they need. Therapy and support groups  Find a support group in your community. Often, groups are available for [employer] veterans, trauma victims, and family members or caregivers.  Look into volunteer opportunities. Taking part in these can help you feel more connected to your  community.  Contact a local organization to find out if you are eligible for a service dog. Where to find more information Go to this website to find more information about PTSD, treatment of PTSD, and how to get support:  [facility] for PTSD: www.ptsd.FitBoxer.tn Contact a health care provider if:  Your symptoms get worse or do not get better. Get help right away if:  You have thoughts about hurting yourself or others. If you ever feel like you may hurt yourself or others, or have thoughts about taking your own life, get help right away. You can go to your nearest emergency department or call:  Your local emergency services (911 in the [place]  A suicide crisis helpline, such as the National Suicide Prevention Lifeline at (825)884-9956. This is open 24-hours a day. Summary  If you are living with PTSD, there are ways to help you recover from it and manage your symptoms.  Find supportive environments and people who understand PTSD. Spend time in those places, and maintain contact with those people.  Work with your health care team to create a plan for managing PTSD. The plan should include counseling, stress reduction techniques, and healthy lifestyle habits. This information is not intended to replace advice given to you by your health care provider. Make sure you discuss any questions you have with your health care provider. Document Released: 08/01/2016 Document Revised: 07/24/2018 Document Reviewed: 08/01/2016 Elsevier Patient Education  2020 [employer].

## 2019-03-25 ENCOUNTER — Other Ambulatory Visit: Payer: Self-pay | Admitting: Emergency Medicine

## 2019-03-25 DIAGNOSIS — F411 Generalized anxiety disorder: Secondary | ICD-10-CM

## 2019-03-25 NOTE — Telephone Encounter (Signed)
Requested medication (s) are due for refill today: yes  Requested medication (s) are on the active medication list: yes  Last refill:  11/17/2018  Future visit scheduled: no  Notes to clinic:  Patient requesting 90 day with 2 refills   Requested Prescriptions  Pending Prescriptions Disp Refills   sertraline (ZOLOFT) 50 MG tablet [Pharmacy Med Name: SERTRALINE HCL 50 MG TABLET] 90 tablet 2    Sig: TAKE 1 TABLET BY MOUTH EVERY DAY      Psychiatry:  Antidepressants - SSRI Failed - 03/25/2019 11:22 AM      Failed - Completed PHQ-2 or PHQ-9 in the last 360 days.      Passed - Valid encounter within last 6 months    Recent Outpatient Visits           1 month ago PTSD (post-traumatic stress disorder)   [facility] at [facility], Ines Bloomer, MD   4 months ago Generalized anxiety disorder   Primary Care at [place], Ines Bloomer, MD   2 years ago Mood disorder [facility])   [facility] at [facility], Laurey Arrow, MD   2 years ago Mood disorder [facility])   [facility] at [facility], Laurey Arrow, MD   2 years ago Anxiety state   [facility] at [facility], Laurey Arrow, MD

## 2019-03-26 NOTE — Telephone Encounter (Signed)
Patient is requesting a refill of the following medications: Requested Prescriptions   Pending Prescriptions Disp Refills   sertraline (ZOLOFT) 50 MG tablet [Pharmacy Med Name: SERTRALINE HCL 50 MG TABLET] 90 tablet 2    Sig: TAKE 1 TABLET BY MOUTH EVERY DAY    Date of patient request: 03/25/19 Last office visit: 02/08/19 Date of last refill: 11/17/18 Last refill amount: 30 3 rf Follow up time period per chart: none listed

## 2019-03-28 ENCOUNTER — Other Ambulatory Visit: Payer: Self-pay | Admitting: Emergency Medicine

## 2020-02-09 ENCOUNTER — Ambulatory Visit (INDEPENDENT_AMBULATORY_CARE_PROVIDER_SITE_OTHER): Payer: Self-pay | Admitting: Emergency Medicine

## 2020-02-09 ENCOUNTER — Other Ambulatory Visit: Payer: Self-pay

## 2020-02-09 ENCOUNTER — Encounter: Payer: Self-pay | Admitting: Emergency Medicine

## 2020-02-09 ENCOUNTER — Other Ambulatory Visit: Payer: Self-pay | Admitting: Emergency Medicine

## 2020-02-09 ENCOUNTER — Telehealth: Payer: Self-pay

## 2020-02-09 ENCOUNTER — Ambulatory Visit (INDEPENDENT_AMBULATORY_CARE_PROVIDER_SITE_OTHER): Payer: Self-pay

## 2020-02-09 VITALS — BP 145/71 | HR 77 | Temp 98.7°F | Resp 16 | Ht 71.0 in | Wt 170.0 lb

## 2020-02-09 DIAGNOSIS — L0591 Pilonidal cyst without abscess: Secondary | ICD-10-CM

## 2020-02-09 DIAGNOSIS — M25522 Pain in left elbow: Secondary | ICD-10-CM

## 2020-02-09 DIAGNOSIS — M79642 Pain in left hand: Secondary | ICD-10-CM

## 2020-02-09 DIAGNOSIS — M79609 Pain in unspecified limb: Secondary | ICD-10-CM

## 2020-02-09 NOTE — Patient Instructions (Addendum)
   If you have lab work done today you will be contacted with your lab results within the next 2 weeks.  If you have not heard from us then please contact us. The fastest way to get your results is to register for My Chart.   IF you received an x-ray today, you will receive an invoice from [facility]. Please contact [facility] at [phone] with questions or concerns regarding your invoice.   IF you received labwork today, you will receive an invoice from [facility]. Please contact [facility] at [phone] with questions or concerns regarding your invoice.   Our billing staff will not be able to assist you with questions regarding bills from these companies.  You will be contacted with the lab results as soon as they are available. The fastest way to get your results is to activate your My Chart account. Instructions are located on the last page of this paperwork. If you have not heard from us regarding the results in 2 weeks, please contact this office.     Muscle Strain A muscle strain is an injury that happens when a muscle is stretched longer than normal. This can happen during a fall, sports, or lifting. This can tear some muscle fibers. Usually, recovery from muscle strain takes 1-2 weeks. Complete healing normally takes 5-6 weeks. This condition is first treated with PRICE therapy. This involves:  Protecting your muscle from being injured again.  Resting your injured muscle.  Icing your injured muscle.  Applying pressure (compression) to your injured muscle. This may be done with a splint or elastic bandage.  Raising (elevating) your injured muscle. Your doctor may also recommend medicine for pain. Follow these instructions at home: If you have a splint:  Wear the splint as told by your doctor. Take it off only as told by your doctor.  Loosen the splint if your fingers or toes tingle, get numb, or turn cold and blue.  Keep the splint clean.  If the  splint is not waterproof: ? Do not let it get wet. ? Cover it with a watertight covering when you take a bath or a shower. Managing pain, stiffness, and swelling   If directed, put ice on your injured area. ? If you have a removable splint, take it off as told by your doctor. ? Put ice in a plastic bag. ? Place a towel between your skin and the bag. ? Leave the ice on for 20 minutes, 2-3 times a day.  Move your fingers or toes often. This helps to avoid stiffness and lessen swelling.  Raise your injured area above the level of your heart while you are sitting or lying down.  Wear an elastic bandage as told by your doctor. Make sure it is not too tight. General instructions  Take over-the-counter and prescription medicines only as told by your doctor.  Limit your activity. Rest your injured muscle as told by your doctor. Your doctor may say that gentle movements are okay.  If physical therapy was prescribed, do exercises as told by your doctor.  Do not put pressure on any part of the splint until it is fully hardened. This may take many hours.  Do not use any products that contain nicotine or tobacco, such as cigarettes and e-cigarettes. These can delay bone healing. If you need help quitting, ask your doctor.  Warm up before you exercise. This helps to prevent more muscle strains.  Ask your doctor when it is safe to drive   if you have a splint.  Keep all follow-up visits as told by your doctor. This is important. Contact a doctor if:  You have more pain or swelling in your injured area. Get help right away if:  You have any of these problems in your injured area: ? You have numbness. ? You have tingling. ? You lose a lot of strength. Summary  A muscle strain is an injury that happens when a muscle is stretched longer than normal.  This condition is first treated with PRICE therapy. This includes protecting, resting, icing, adding pressure, and raising your  injury.  Limit your activity. Rest your injured muscle as told by your doctor. Your doctor may say that gentle movements are okay.  Warm up before you exercise. This helps to prevent more muscle strains. This information is not intended to replace advice given to you by your health care provider. Make sure you discuss any questions you have with your health care provider. Document Revised: 05/28/2018 Document Reviewed: 05/08/2016 Elsevier Patient Education  2020 [employer].

## 2020-02-09 NOTE — Progress Notes (Signed)
Ronald [name] 39 y.o.   Chief Complaint  Patient presents with  . Arm Pain    and hand pain for 2 weeks, per patient he works out    HISTORY OF PRESENT ILLNESS: This is a 39 y.o. male complaining of pain to left elbow and left hand for the past 2 weeks.  Works out regularly.  No other injury.  Denies any other associated symptoms.  HPI   Prior to Admission medications   Medication Sig Start Date End Date Taking? Authorizing Provider  LORazepam (ATIVAN) 1 MG tablet Take 1 tablet (1 mg total) by mouth daily as needed for anxiety. 11/17/18  Yes Daeshawn Redmann, Eilleen KempfMiguel Jose, MD  Melatonin 10 MG CAPS Take 10 mg by mouth at bedtime. 12/23/16  Yes Sherren MochaShaw, Eva N, MD  UNABLE TO FIND Med Name: C-60 (Carbon supplement for healing) 12/23/16  Yes Sherren MochaShaw, Eva N, MD  sertraline (ZOLOFT) 50 MG tablet TAKE 1 TABLET BY MOUTH EVERY DAY Patient not taking: Reported on 02/09/2020 03/28/19   Georgina QuintSagardia, Layden Caterino Jose, MD  UNABLE TO FIND Med Name: CBD OIL Patient not taking: Reported on 02/09/2020 12/23/16   Sherren MochaShaw, Eva N, MD  budesonide (RHINOCORT AQUA) 32 MCG/ACT nasal spray Place 2 sprays into both nostrils daily. Patient not taking: Reported on 11/17/2018 08/22/16 01/29/19  Sherren MochaShaw, Eva N, MD    Allergies  Allergen Reactions  . Hydroxyzine Anxiety    Other reaction(s): Other "hyper "  . Benadryl [Diphenhydramine]     itching  . Other     All antihistamines  . Penicillins     Mother states he's not allergic to this    Patient Active Problem List   Diagnosis Date Noted  . Schizophrenia, paranoid type (HCC) 12/23/2016  . PTSD (post-traumatic stress disorder) 12/15/2016  . Bipolar I disorder, single manic episode, severe with psychotic features (HCC) 09/29/2015  . Mood disorder (HCC) 09/22/2015  . Disorder of pineal gland     Past Medical History:  Diagnosis Date  . Anxiety   . Arthritis   . Disorder of pineal gland   . Post traumatic stress disorder     Past Surgical History:  Procedure Laterality Date    . ANKLE SURGERY      Social History   Socioeconomic History  . Marital status: Single    Spouse name: Not on file  . Number of children: Not on file  . Years of education: Not on file  . Highest education level: Not on file  Occupational History  . Not on file  Tobacco Use  . Smoking status: Never Smoker  . Smokeless tobacco: Current User    Types: Chew  Vaping Use  . Vaping Use: Never used  Substance and Sexual Activity  . Alcohol use: No  . Drug use: No    Comment: one year ago  . Sexual activity: Never  Other Topics Concern  . Not on file  Social History Narrative  . Not on file   Social Determinants of Health   Financial Resource Strain:   . Difficulty of Paying Living Expenses: Not on file  Food Insecurity:   . Worried About Programme researcher, broadcasting/film/videounning Out of Food in the Last Year: Not on file  . Ran Out of Food in the Last Year: Not on file  Transportation Needs:   . Lack of Transportation (Medical): Not on file  . Lack of Transportation (Non-Medical): Not on file  Physical Activity:   . Days of Exercise per Week: Not on file  .  Minutes of Exercise per Session: Not on file  Stress:   . Feeling of Stress : Not on file  Social Connections:   . Frequency of Communication with Friends and Family: Not on file  . Frequency of Social Gatherings with Friends and Family: Not on file  . Attends Religious Services: Not on file  . Active Member of Clubs or Organizations: Not on file  . Attends Banker Meetings: Not on file  . Marital Status: Not on file  Intimate Partner Violence:   . Fear of Current or Ex-Partner: Not on file  . Emotionally Abused: Not on file  . Physically Abused: Not on file  . Sexually Abused: Not on file    No family history on file.   Review of Systems  Constitutional: Negative.  Negative for chills and fever.  HENT: Negative.  Negative for congestion and sore throat.   Respiratory: Negative.  Negative for cough and shortness of breath.    Cardiovascular: Negative.  Negative for chest pain and palpitations.  Gastrointestinal: Negative.  Negative for abdominal pain, diarrhea, nausea and vomiting.  Genitourinary: Negative.   Skin: Negative.  Negative for rash.  Neurological: Negative.  Negative for dizziness and headaches.  All other systems reviewed and are negative.  Today's Vitals   02/09/20 1512  BP: (!) 145/71  Pulse: 77  Resp: 16  Temp: 98.7 F (37.1 C)  TempSrc: Temporal  SpO2: 99%  Weight: 170 lb (77.1 kg)  Height: 5\' 11"  (1.803 m)   Body mass index is 23.71 kg/m.   Physical Exam Vitals reviewed.  Constitutional:      Appearance: Normal appearance.  HENT:     Head: Normocephalic.  Eyes:     Extraocular Movements: Extraocular movements intact.     Pupils: Pupils are equal, round, and reactive to light.  Cardiovascular:     Rate and Rhythm: Normal rate.  Pulmonary:     Effort: Pulmonary effort is normal.  Musculoskeletal:     Cervical back: Normal range of motion.     Comments: Left elbow: Mild tenderness to antecubital area.  No erythema or swelling.  Full range of motion. Left wrist: Within normal limits and full range of motion. Left hand: Mild tenderness to thenar area, otherwise within normal limits and full range of motion.  Skin:    General: Skin is warm and dry.     Capillary Refill: Capillary refill takes less than 2 seconds.  Neurological:     General: No focal deficit present.     Mental Status: He is alert and oriented to person, place, and time.  Psychiatric:        Mood and Affect: Mood normal.        Behavior: Behavior normal.    DG ELBOW COMPLETE LEFT (3+VIEW)  Result Date: 02/09/2020 CLINICAL DATA:  Elbow pain EXAM: LEFT ELBOW - COMPLETE 3+ VIEW COMPARISON:  None. FINDINGS: There is no evidence of fracture, dislocation, or joint effusion. There is no evidence of arthropathy or other focal bone abnormality. Soft tissues are unremarkable. IMPRESSION: Negative. Electronically  Signed   By: 02/11/2020 M.D.   On: 02/09/2020 15:34   DG Hand Complete Left  Result Date: 02/09/2020 CLINICAL DATA:  Pain. EXAM: LEFT HAND - COMPLETE 3+ VIEW COMPARISON:  None. FINDINGS: There is no evidence of fracture or dislocation. There is no evidence of arthropathy or other focal bone abnormality. Soft tissues are unremarkable. IMPRESSION: Negative. Electronically Signed   By: 02/11/2020.D.  On: 02/09/2020 15:36     ASSESSMENT & PLAN: Ronald [name] was seen today for arm pain.  Diagnoses and all orders for this visit:  Pain of soft tissue of extremity  Left hand pain -     Cancel: DG Hand 2 View Left -     DG Hand Complete Left  Left elbow pain -     DG ELBOW COMPLETE LEFT (3+VIEW)    Patient Instructions       If you have lab work done today you will be contacted with your lab results within the next 2 weeks.  If you have not heard from [place] then please contact us. The fastest way to get your results is to register for My Chart.   IF you received an x-ray today, you will receive an invoice from [facility]. Please contact [facility] at [phone] with questions or concerns regarding your invoice.   IF you received labwork today, you will receive an invoice from [place]. Please contact [facility] at [phone] with questions or concerns regarding your invoice.   Our billing staff will not be able to assist you with questions regarding bills from these companies.  You will be contacted with the lab results as soon as they are available. The fastest way to get your results is to activate your My Chart account. Instructions are located on the last page of this paperwork. If you have not heard from [place] regarding the results in 2 weeks, please contact this office.     Muscle Strain A muscle strain is an injury that happens when a muscle is stretched longer than normal. This can happen during a fall, sports, or lifting. This can tear some muscle  fibers. Usually, recovery from muscle strain takes 1-2 weeks. Complete healing normally takes 5-6 weeks. This condition is first treated with PRICE therapy. This involves:  Protecting your muscle from being injured again.  Resting your injured muscle.  Icing your injured muscle.  Applying pressure (compression) to your injured muscle. This may be done with a splint or elastic bandage.  Raising (elevating) your injured muscle. Your doctor may also recommend medicine for pain. Follow these instructions at home: If you have a splint:  Wear the splint as told by your doctor. Take it off only as told by your doctor.  Loosen the splint if your fingers or toes tingle, get numb, or turn cold and blue.  Keep the splint clean.  If the splint is not waterproof: ? Do not let it get wet. ? Cover it with a watertight covering when you take a bath or a shower. Managing pain, stiffness, and swelling   If directed, put ice on your injured area. ? If you have a removable splint, take it off as told by your doctor. ? Put ice in a plastic bag. ? Place a towel between your skin and the bag. ? Leave the ice on for 20 minutes, 2-3 times a day.  Move your fingers or toes often. This helps to avoid stiffness and lessen swelling.  Raise your injured area above the level of your heart while you are sitting or lying down.  Wear an elastic bandage as told by your doctor. Make sure it is not too tight. General instructions  Take over-the-counter and prescription medicines only as told by your doctor.  Limit your activity. Rest your injured muscle as told by your doctor. Your doctor may say that gentle movements are okay.  If physical therapy was prescribed, do exercises  as told by your doctor.  Do not put pressure on any part of the splint until it is fully hardened. This may take many hours.  Do not use any products that contain nicotine or tobacco, such as cigarettes and e-cigarettes. These can  delay bone healing. If you need help quitting, ask your doctor.  Warm up before you exercise. This helps to prevent more muscle strains.  Ask your doctor when it is safe to drive if you have a splint.  Keep all follow-up visits as told by your doctor. This is important. Contact a doctor if:  You have more pain or swelling in your injured area. Get help right away if:  You have any of these problems in your injured area: ? You have numbness. ? You have tingling. ? You lose a lot of strength. Summary  A muscle strain is an injury that happens when a muscle is stretched longer than normal.  This condition is first treated with PRICE therapy. This includes protecting, resting, icing, adding pressure, and raising your injury.  Limit your activity. Rest your injured muscle as told by your doctor. Your doctor may say that gentle movements are okay.  Warm up before you exercise. This helps to prevent more muscle strains. This information is not intended to replace advice given to you by your health care provider. Make sure you discuss any questions you have with your health care provider. Document Revised: 05/28/2018 Document Reviewed: 05/08/2016 Elsevier Patient Education  2020 Elsevier Inc.      Edwina Barth, MD [facility]

## 2020-02-09 NOTE — Telephone Encounter (Signed)
Pt is requesting referral to get the cyst in his penial gland drain since it is starting to bother him.

## 2020-02-15 ENCOUNTER — Emergency Department (HOSPITAL_COMMUNITY): Payer: Self-pay

## 2020-02-15 ENCOUNTER — Encounter (HOSPITAL_COMMUNITY): Payer: Self-pay

## 2020-02-15 ENCOUNTER — Emergency Department (HOSPITAL_COMMUNITY)
Admission: EM | Admit: 2020-02-15 | Discharge: 2020-02-15 | Disposition: A | Discharge status: Home / Self Care | Payer: Self-pay | Attending: Emergency Medicine | Admitting: Emergency Medicine

## 2020-02-15 DIAGNOSIS — H538 Other visual disturbances: Secondary | ICD-10-CM | POA: Insufficient documentation

## 2020-02-15 DIAGNOSIS — Z20822 Contact with and (suspected) exposure to covid-19: Secondary | ICD-10-CM | POA: Insufficient documentation

## 2020-02-15 DIAGNOSIS — R519 Headache, unspecified: Secondary | ICD-10-CM

## 2020-02-15 DIAGNOSIS — R002 Palpitations: Secondary | ICD-10-CM | POA: Insufficient documentation

## 2020-02-15 DIAGNOSIS — F419 Anxiety disorder, unspecified: Secondary | ICD-10-CM | POA: Insufficient documentation

## 2020-02-15 LAB — CBC
HCT: 44.7 % (ref 39.0–52.0)
Hemoglobin: 15.5 g/dL (ref 13.0–17.0)
MCH: 30.8 pg (ref 26.0–34.0)
MCHC: 34.7 g/dL (ref 30.0–36.0)
MCV: 88.9 fL (ref 80.0–100.0)
Platelets: 228 10*3/uL (ref 150–400)
RBC: 5.03 MIL/uL (ref 4.22–5.81)
RDW: 12.2 % (ref 11.5–15.5)
WBC: 8.3 10*3/uL (ref 4.0–10.5)
nRBC: 0 % (ref 0.0–0.2)

## 2020-02-15 LAB — BASIC METABOLIC PANEL
Anion gap: 10 (ref 5–15)
BUN: 12 mg/dL (ref 6–20)
CO2: 24 mmol/L (ref 22–32)
Calcium: 9.2 mg/dL (ref 8.9–10.3)
Chloride: 101 mmol/L (ref 98–111)
Creatinine, Ser: 1.39 mg/dL — ABNORMAL HIGH (ref 0.61–1.24)
GFR, Estimated: >60 mL/min (ref >60)
Glucose, Bld: 153 mg/dL — ABNORMAL HIGH (ref 70–99)
Potassium: 3.7 mmol/L (ref 3.5–5.1)
Sodium: 135 mmol/L (ref 135–145)

## 2020-02-15 LAB — RESPIRATORY PANEL BY RT PCR (FLU A&B, COVID)
Influenza A by PCR: NEGATIVE
Influenza B by PCR: NEGATIVE
SARS Coronavirus 2 by RT PCR: NEGATIVE

## 2020-02-15 MED ORDER — PROCHLORPERAZINE EDISYLATE 10 MG/2ML IJ SOLN
10.0000 mg | Freq: Once | INTRAMUSCULAR | Status: AC
  Administered 2020-02-15: 10 mg via INTRAVENOUS
  Filled 2020-02-15: qty 2

## 2020-02-15 NOTE — ED Triage Notes (Addendum)
Head trauma 10 years ago. Dx of pineal cyst - was told that one day it would swell and he would need it drained. C/o worsening headache x 3 days. Pt also c/o palpitations and blurred vision that started with the HA.

## 2020-02-15 NOTE — Discharge Instructions (Addendum)
Take over-the-counter medications as needed for headache. Follow-up with your primary care doctor to discuss further evaluation  Your Covid test should be back by later this afternoon. Remain quarantined until you receive the results. Check your MyChart for the results

## 2020-02-15 NOTE — ED Notes (Signed)
Pt able to ambulate to bathroom without assistance

## 2020-02-15 NOTE — ED Provider Notes (Signed)
[facility]-EMERGENCY DEPT Provider Note   CSN: [id number] Arrival date & time: 02/15/20  0827     History Chief Complaint  Patient presents with  . Headache  . Blurred Vision  . Palpitations    Ronald [name] is a 39 y.o. male.  HPI    Patient presents to the ED for evaluation of a headache.  Patient states that started about 4 days ago.  It has been gradually increasing each day.  He has a pounding sensation throughout his head.  Patient states it started to cause his vision to be blurred and he was feeling palpitations in his chest.  Patient states he had a traumatic head injury 10 years ago.  He was diagnosed with a pineal cyst.  He was told that it could swell and would require surgery and if he did not do so it could be life-threatening.  Patient is certain that it is pineal gland.  He wants to have it removed today.  He denies any fevers or chills.  No thunderclap onset.  Past Medical History:  Diagnosis Date  . Anxiety   . Arthritis   . Disorder of pineal gland   . Post traumatic stress disorder     Patient Active Problem List   Diagnosis Date Noted  . Schizophrenia, paranoid type (HCC) 12/23/2016  . PTSD (post-traumatic stress disorder) 12/15/2016  . Bipolar I disorder, single manic episode, severe with psychotic features (HCC) 09/29/2015  . Mood disorder (HCC) 09/22/2015  . Disorder of pineal gland     Past Surgical History:  Procedure Laterality Date  . ANKLE SURGERY         History reviewed. No pertinent family history.  Social History   Tobacco Use  . Smoking status: Never Smoker  . Smokeless tobacco: Current User    Types: Chew  Vaping Use  . Vaping Use: Never used  Substance Use Topics  . Alcohol use: No  . Drug use: No    Comment: one year ago    Home Medications Prior to Admission medications   Medication Sig Start Date End Date Taking? Authorizing Provider  LORazepam (ATIVAN) 1 MG tablet Take 1 tablet (1 mg total)  by mouth daily as needed for anxiety. 11/17/18   Georgina Quint, MD  Melatonin 10 MG CAPS Take 10 mg by mouth at bedtime. 12/23/16   Sherren Mocha, MD  sertraline (ZOLOFT) 50 MG tablet TAKE 1 TABLET BY MOUTH EVERY DAY Patient not taking: Reported on 02/09/2020 03/28/19   Georgina Quint, MD  UNABLE TO FIND Med Name: CBD OIL Patient not taking: Reported on 02/09/2020 12/23/16   Sherren Mocha, MD  UNABLE TO FIND Med Name: C-60 (Carbon supplement for healing) 12/23/16   Sherren Mocha, MD  budesonide (RHINOCORT AQUA) 32 MCG/ACT nasal spray Place 2 sprays into both nostrils daily. Patient not taking: Reported on 11/17/2018 08/22/16 01/29/19  Sherren Mocha, MD    Allergies    Hydroxyzine, Benadryl [diphenhydramine], Other, and Penicillins  Review of Systems   Review of Systems  All other systems reviewed and are negative.   Physical Exam Updated Vital Signs BP (!) 146/87   Pulse 76   Temp 99.1 F (37.3 C) (Oral)   Resp 20   Ht 1.803 m (5\' 11" )   Wt 77.6 kg   SpO2 95%   BMI 23.85 kg/m   Physical Exam Vitals and nursing note reviewed.  Constitutional:      General: He  is not in acute distress.    Appearance: He is well-developed.  HENT:     Head: Normocephalic and atraumatic.     Right Ear: External ear normal.     Left Ear: External ear normal.  Eyes:     General: No scleral icterus.       Right eye: No discharge.        Left eye: No discharge.     Conjunctiva/sclera: Conjunctivae normal.  Neck:     Trachea: No tracheal deviation.  Cardiovascular:     Rate and Rhythm: Normal rate and regular rhythm.  Pulmonary:     Effort: Pulmonary effort is normal. No respiratory distress.     Breath sounds: Normal breath sounds. No stridor. No wheezing or rales.  Abdominal:     General: Bowel sounds are normal. There is no distension.     Palpations: Abdomen is soft.     Tenderness: There is no abdominal tenderness. There is no guarding or rebound.  Musculoskeletal:        General:  No tenderness.     Cervical back: Neck supple.  Skin:    General: Skin is warm and dry.     Findings: No rash.  Neurological:     Mental Status: He is alert.     Cranial Nerves: No cranial nerve deficit (no facial droop, extraocular movements intact, no slurred speech).     Sensory: No sensory deficit.     Motor: No abnormal muscle tone or seizure activity.     Coordination: Coordination normal.  Psychiatric:        Mood and Affect: Mood is anxious.     ED Results / Procedures / Treatments   Labs (all labs ordered are listed, but only abnormal results are displayed) Labs Reviewed  BASIC METABOLIC PANEL - Abnormal; Notable for the following components:      Result Value   Glucose, Bld 153 (*)    Creatinine, Ser 1.39 (*)    All other components within normal limits  RESPIRATORY PANEL BY RT PCR (FLU A&B, COVID)  CBC    EKG EKG Interpretation  Date/Time:  Tuesday February 15 2020 09:00:10 EDT Ventricular Rate:  76 PR Interval:    QRS Duration: 78 QT Interval:  347 QTC Calculation: 391 R Axis:   76 Text Interpretation: Sinus rhythm Early repolarization No significant change since last tracing Confirmed by Linwood Dibbles [phone]) on 02/15/2020 9:05:37 AM   Radiology CT Head Wo Contrast  Result Date: 02/15/2020 CLINICAL DATA:  Headache, intracranial hemorrhage suspected. Additional history provided by scanning technologist: Head trauma 10 years ago, previous diagnosis of pineal cyst, worsening headache, palpitations, blurred vision. EXAM: CT HEAD WITHOUT CONTRAST TECHNIQUE: Contiguous axial images were obtained from the base of the skull through the vertex without intravenous contrast. COMPARISON:  Head CT 11/15/2018. FINDINGS: Brain: Cerebral volume is normal for age. There is no acute intracranial hemorrhage. No demarcated cortical infarct. No extra-axial fluid collection. No evidence of intracranial mass. No midline shift. Vascular: No hyperdense vessel. Skull: Normal. Negative for  fracture or focal lesion. Sinuses/Orbits: Visualized orbits show no acute finding. Mild ethmoid sinus mucosal thickening. Other: Trace fluid within the inferior mastoid air cells on the left. IMPRESSION: Unremarkable non-contrast CT appearance of the brain for age. No evidence of acute intracranial abnormality. Mild ethmoid sinus mucosal thickening. Electronically Signed   By: Jackey Loge DO   On: 02/15/2020 09:43    Procedures Procedures (including critical care time)  Medications Ordered in  ED Medications  prochlorperazine (COMPAZINE) injection 10 mg (10 mg Intravenous Given 02/15/20 0857)    ED Course  I have reviewed the triage vital signs and the nursing notes.  Pertinent labs & imaging results that were available during my care of the patient were reviewed by me and considered in my medical decision making (see chart for details).  Clinical Course as of Feb 14 1017  Tue Feb 15, 2020  0957 Normal CBC  CBC [JK]  0957 Creatinine is increased but unchanged from baseline  Basic metabolic panel(!) [JK]  0958 Head CT without acute findings.  Mild ethmoid thickening.  Doubt This is clinically significant   [JK]  1005 Patient states his headache has resolved after treatment   [JK]  1013 Discussed findings with patient. Reassured him the ED work-up was normal and patient was ready for discharge. After I left the room patient told the nurse that he thinks he had a fever last night and wants to get a Covid test. We'll do that prior to discharge   [JK]  1016 Patient has now called out to ED secretary requesting a chest x-ray because he is having palpitations   [JK]    Clinical Course User Index [JK] Linwood Dibbles, MD   MDM Rules/Calculators/A&P                          Patient complained of difficulties with headache and palpitations. Patient was concerned that he had pineal gland swelling. Patient felt he had a cyst in the past after he had a traumatic brain injury. Patient did have a CT  scan back in August 2020. There was mention of some cysts in the maxillary sinus but no mention of pineal gland at least at that time. Patient may have had imaging at another institution. Head CT today is normal. Symptoms not concerning for subarachnoid hemorrhage. Patient was also complaining of palpitations but he is a normal heart rhythm on EKG. His vitals are normal. Laboratory tests are reassuring. I suspect there could be an anxiety component as the patient keeps on requesting additional tests and evaluation and new symptoms discussion of his initial findings.  I reassured the patient. Recommend outpatient follow-up with his primary care doctor. Patient did have a Covid test done as requested. Results were not available prior to discharge Final Clinical Impression(s) / ED Diagnoses Final diagnoses:  Palpitations  Nonintractable headache, unspecified chronicity pattern, unspecified headache type    Rx / DC Orders ED Discharge Orders    None       Linwood Dibbles, MD 02/15/20 1018

## 2020-02-17 ENCOUNTER — Emergency Department (HOSPITAL_BASED_OUTPATIENT_CLINIC_OR_DEPARTMENT_OTHER)
Admission: EM | Admit: 2020-02-17 | Discharge: 2020-02-17 | Disposition: A | Discharge status: Home / Self Care | Payer: Self-pay | Attending: Emergency Medicine | Admitting: Emergency Medicine

## 2020-02-17 ENCOUNTER — Encounter (HOSPITAL_BASED_OUTPATIENT_CLINIC_OR_DEPARTMENT_OTHER): Payer: Self-pay | Admitting: *Deleted

## 2020-02-17 ENCOUNTER — Other Ambulatory Visit: Payer: Self-pay

## 2020-02-17 ENCOUNTER — Emergency Department (HOSPITAL_BASED_OUTPATIENT_CLINIC_OR_DEPARTMENT_OTHER): Payer: Self-pay

## 2020-02-17 DIAGNOSIS — F17220 Nicotine dependence, chewing tobacco, uncomplicated: Secondary | ICD-10-CM | POA: Insufficient documentation

## 2020-02-17 DIAGNOSIS — F419 Anxiety disorder, unspecified: Secondary | ICD-10-CM | POA: Insufficient documentation

## 2020-02-17 NOTE — ED Triage Notes (Signed)
Mothers states increased anxiety and bizarre behavior, pt reading bible in triage and states " im fine"

## 2020-02-17 NOTE — ED Notes (Signed)
Pt refused to sign AMA form ( would not consent to obtaining vital signs, pt refused to wear a pt gown or sign AMA form)
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
## 2020-02-17 NOTE — ED Notes (Signed)
Charge RN, PA and ED MD on duty all aware of pt leaving

## 2020-02-17 NOTE — ED Notes (Signed)
Consulting civil engineer, this Financial planner went outside, pt stated he wanted to be seen this time, Consulting civil engineer informed pt and mother we would be very happy to see him and evaluate his rib pain, pt was asked again, by this RN "do you want to be evaluated?" pt stated "I want to go home, I refuse treatment", this statement was made in front of Engineer, materials, charge RN and this Charity fundraiser. Pt left with mother in POV

## 2020-02-17 NOTE — ED Notes (Signed)
Pt has returned back to his original exam room, states he would like to be evaluated for his rt sided pain. When entering room to place pt in gown and obtain VS, pt again got up and left. Pt this time, left the facility with his mother
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
## 2020-02-17 NOTE — ED Notes (Signed)
ED Provider at bedside. 

## 2020-02-17 NOTE — ED Notes (Signed)
Pt noted to get up and walk out of examination area, Charge RN aware, pt stated "I am fine now and do not need any service" attempted to talk with pt to ensure that he has resources outside of the ED, does he have medication at home etc. Stated "I am not talking to anyone anymore, I do not need any service", pt walked out of ED lobby to parking lot, immediately walked back and stated he needs his side looked at that he had pain at rt chest, when attempting to return pt to the exam room and explain we need to get x-rays, he again got up and walked out with his mother.

## 2020-02-18 ENCOUNTER — Ambulatory Visit (INDEPENDENT_AMBULATORY_CARE_PROVIDER_SITE_OTHER): Payer: Self-pay | Admitting: Registered Nurse

## 2020-02-18 ENCOUNTER — Other Ambulatory Visit: Payer: Self-pay

## 2020-02-18 ENCOUNTER — Encounter: Payer: Self-pay | Admitting: Registered Nurse

## 2020-02-18 VITALS — BP 138/86 | HR 83 | Temp 98.7°F | Resp 18 | Ht 71.0 in | Wt 170.8 lb

## 2020-02-18 DIAGNOSIS — F39 Unspecified mood [affective] disorder: Secondary | ICD-10-CM

## 2020-02-18 MED ORDER — CLONAZEPAM 1 MG PO TABS: 1.0000 mg | ORAL_TABLET | Freq: Two times a day (BID) | ORAL | 0 refills | Status: DC | PRN

## 2020-02-18 MED ORDER — DIVALPROEX SODIUM ER 500 MG PO TB24: 500.0000 mg | ORAL_TABLET | Freq: Every day | ORAL | 1 refills | Status: DC

## 2020-02-18 NOTE — Patient Instructions (Signed)
° ° ° °  If you have lab work done today you will be contacted with your lab results within the next 2 weeks.  If you have not heard from us then please contact us. The fastest way to get your results is to register for My Chart. ° ° °IF you received an x-ray today, you will receive an invoice from [facility]. Please contact [facility] at [phone] with questions or concerns regarding your invoice.  ° °IF you received labwork today, you will receive an invoice from [facility]. Please contact [facility] at [phone] with questions or concerns regarding your invoice.  ° °Our billing staff will not be able to assist you with questions regarding bills from these companies. ° °You will be contacted with the lab results as soon as they are available. The fastest way to get your results is to activate your My Chart account. Instructions are located on the last page of this paperwork. If you have not heard from us regarding the results in 2 weeks, please contact this office. °  ° ° ° °

## 2020-02-19 ENCOUNTER — Emergency Department (HOSPITAL_COMMUNITY): Payer: Self-pay

## 2020-02-19 ENCOUNTER — Encounter (HOSPITAL_COMMUNITY): Payer: Self-pay | Admitting: Emergency Medicine

## 2020-02-19 ENCOUNTER — Encounter (HOSPITAL_COMMUNITY): Payer: Self-pay | Admitting: Registered Nurse

## 2020-02-19 ENCOUNTER — Ambulatory Visit (HOSPITAL_COMMUNITY)
Admission: EM | Admit: 2020-02-19 | Discharge: 2020-02-19 | Disposition: A | Discharge status: Other Acute Inpatient Hospital | Payer: No Payment, Other | Attending: Registered Nurse | Admitting: Registered Nurse

## 2020-02-19 ENCOUNTER — Emergency Department (HOSPITAL_COMMUNITY)
Admission: EM | Admit: 2020-02-19 | Discharge: 2020-02-21 | Disposition: A | Discharge status: Other Acute Inpatient Hospital | Payer: Self-pay | Attending: Emergency Medicine | Admitting: Emergency Medicine

## 2020-02-19 ENCOUNTER — Other Ambulatory Visit: Payer: Self-pay

## 2020-02-19 DIAGNOSIS — F1210 Cannabis abuse, uncomplicated: Secondary | ICD-10-CM | POA: Diagnosis present

## 2020-02-19 DIAGNOSIS — F22 Delusional disorders: Secondary | ICD-10-CM | POA: Diagnosis not present

## 2020-02-19 DIAGNOSIS — F4310 Post-traumatic stress disorder, unspecified: Secondary | ICD-10-CM | POA: Diagnosis not present

## 2020-02-19 DIAGNOSIS — R0789 Other chest pain: Secondary | ICD-10-CM | POA: Insufficient documentation

## 2020-02-19 DIAGNOSIS — F1410 Cocaine abuse, uncomplicated: Secondary | ICD-10-CM | POA: Diagnosis present

## 2020-02-19 DIAGNOSIS — F1994 Other psychoactive substance use, unspecified with psychoactive substance-induced mood disorder: Secondary | ICD-10-CM | POA: Diagnosis present

## 2020-02-19 DIAGNOSIS — F1510 Other stimulant abuse, uncomplicated: Secondary | ICD-10-CM | POA: Diagnosis present

## 2020-02-19 DIAGNOSIS — Z20822 Contact with and (suspected) exposure to covid-19: Secondary | ICD-10-CM | POA: Diagnosis not present

## 2020-02-19 DIAGNOSIS — F419 Anxiety disorder, unspecified: Secondary | ICD-10-CM | POA: Insufficient documentation

## 2020-02-19 DIAGNOSIS — F200 Paranoid schizophrenia: Secondary | ICD-10-CM | POA: Diagnosis present

## 2020-02-19 DIAGNOSIS — F600 Paranoid personality disorder: Secondary | ICD-10-CM | POA: Insufficient documentation

## 2020-02-19 DIAGNOSIS — F411 Generalized anxiety disorder: Secondary | ICD-10-CM | POA: Diagnosis present

## 2020-02-19 DIAGNOSIS — F1910 Other psychoactive substance abuse, uncomplicated: Secondary | ICD-10-CM | POA: Diagnosis present

## 2020-02-19 DIAGNOSIS — Z560 Unemployment, unspecified: Secondary | ICD-10-CM | POA: Insufficient documentation

## 2020-02-19 DIAGNOSIS — F1520 Other stimulant dependence, uncomplicated: Secondary | ICD-10-CM | POA: Diagnosis present

## 2020-02-19 DIAGNOSIS — F302 Manic episode, severe with psychotic symptoms: Secondary | ICD-10-CM | POA: Diagnosis present

## 2020-02-19 LAB — COMPREHENSIVE METABOLIC PANEL
ALT: 49 U/L — ABNORMAL HIGH (ref 0–44)
ALT: 52 U/L — ABNORMAL HIGH (ref 0–44)
AST: 99 U/L — ABNORMAL HIGH (ref 15–41)
AST: 111 U/L — ABNORMAL HIGH (ref 15–41)
Albumin: 4.4 g/dL (ref 3.5–5.0)
Albumin: 4.7 g/dL (ref 3.5–5.0)
Alkaline Phosphatase: 52 U/L (ref 38–126)
Alkaline Phosphatase: 54 U/L (ref 38–126)
Anion gap: 12 (ref 5–15)
Anion gap: 13 (ref 5–15)
BUN: 15 mg/dL (ref 6–20)
BUN: 15 mg/dL (ref 6–20)
CO2: 28 mmol/L (ref 22–32)
CO2: 25 mmol/L (ref 22–32)
Calcium: 9.6 mg/dL (ref 8.9–10.3)
Calcium: 9.8 mg/dL (ref 8.9–10.3)
Chloride: 98 mmol/L (ref 98–111)
Chloride: 101 mmol/L (ref 98–111)
Creatinine, Ser: 1.08 mg/dL (ref 0.61–1.24)
Creatinine, Ser: 1.18 mg/dL (ref 0.61–1.24)
GFR, Estimated: >60 mL/min (ref >60)
GFR, Estimated: >60 mL/min (ref >60)
Glucose, Bld: 119 mg/dL — ABNORMAL HIGH (ref 70–99)
Glucose, Bld: 111 mg/dL — ABNORMAL HIGH (ref 70–99)
Potassium: 4 mmol/L (ref 3.5–5.1)
Potassium: 3.7 mmol/L (ref 3.5–5.1)
Sodium: 138 mmol/L (ref 135–145)
Sodium: 139 mmol/L (ref 135–145)
Total Bilirubin: 1.6 mg/dL — ABNORMAL HIGH (ref 0.3–1.2)
Total Bilirubin: 1.9 mg/dL — ABNORMAL HIGH (ref 0.3–1.2)
Total Protein: 7.2 g/dL (ref 6.5–8.1)
Total Protein: 7.6 g/dL (ref 6.5–8.1)

## 2020-02-19 LAB — CBC WITH DIFFERENTIAL/PLATELET
Abs Immature Granulocytes: 0.04 10*3/uL (ref 0.00–0.07)
Basophils Absolute: 0 10*3/uL (ref 0.0–0.1)
Basophils Relative: 0 %
Eosinophils Absolute: 0 10*3/uL (ref 0.0–0.5)
Eosinophils Relative: 0 %
HCT: 44.2 % (ref 39.0–52.0)
Hemoglobin: 15.2 g/dL (ref 13.0–17.0)
Immature Granulocytes: 0 %
Lymphocytes Relative: 15 %
Lymphs Abs: 1.4 10*3/uL (ref 0.7–4.0)
MCH: 30.4 pg (ref 26.0–34.0)
MCHC: 34.4 g/dL (ref 30.0–36.0)
MCV: 88.4 fL (ref 80.0–100.0)
Monocytes Absolute: 0.8 10*3/uL (ref 0.1–1.0)
Monocytes Relative: 8 %
Neutro Abs: 7.5 10*3/uL (ref 1.7–7.7)
Neutrophils Relative %: 77 %
Platelets: 250 10*3/uL (ref 150–400)
RBC: 5 MIL/uL (ref 4.22–5.81)
RDW: 12.3 % (ref 11.5–15.5)
WBC: 9.7 10*3/uL (ref 4.0–10.5)
nRBC: 0 % (ref 0.0–0.2)

## 2020-02-19 LAB — ETHANOL
Alcohol, Ethyl (B): <10 mg/dL (ref <10)
Alcohol, Ethyl (B): <10 mg/dL (ref <10)

## 2020-02-19 LAB — POCT URINE DRUG SCREEN - MANUAL ENTRY (I-SCREEN)
POC Amphetamine UR: POSITIVE — AB
POC Buprenorphine (BUP): NOT DETECTED
POC Cocaine UR: NOT DETECTED
POC Marijuana UR: POSITIVE — AB
POC Methadone UR: NOT DETECTED
POC Methamphetamine UR: POSITIVE — AB
POC Morphine: NOT DETECTED
POC Oxazepam (BZO): NOT DETECTED
POC Oxycodone UR: NOT DETECTED
POC Secobarbital (BAR): NOT DETECTED

## 2020-02-19 LAB — CBC
HCT: 45 % (ref 39.0–52.0)
Hemoglobin: 15.7 g/dL (ref 13.0–17.0)
MCH: 31 pg (ref 26.0–34.0)
MCHC: 34.9 g/dL (ref 30.0–36.0)
MCV: 88.8 fL (ref 80.0–100.0)
Platelets: 240 10*3/uL (ref 150–400)
RBC: 5.07 MIL/uL (ref 4.22–5.81)
RDW: 12.3 % (ref 11.5–15.5)
WBC: 10.8 10*3/uL — ABNORMAL HIGH (ref 4.0–10.5)
nRBC: 0 % (ref 0.0–0.2)

## 2020-02-19 LAB — VALPROIC ACID LEVEL: Valproic Acid Lvl: 50 ug/mL (ref 50.0–100.0)

## 2020-02-19 LAB — RAPID URINE DRUG SCREEN, HOSP PERFORMED
Amphetamines: POSITIVE — AB
Barbiturates: NOT DETECTED
Benzodiazepines: POSITIVE — AB
Cocaine: POSITIVE — AB
Opiates: NOT DETECTED
Tetrahydrocannabinol: POSITIVE — AB

## 2020-02-19 LAB — HEMOGLOBIN A1C
Hgb A1c MFr Bld: 5 % (ref 4.8–5.6)
Mean Plasma Glucose: 96.8 mg/dL

## 2020-02-19 LAB — ACETAMINOPHEN LEVEL: Acetaminophen (Tylenol), Serum: <10 ug/mL — ABNORMAL LOW (ref 10–30)

## 2020-02-19 LAB — LIPID PANEL
Cholesterol: 131 mg/dL (ref 0–200)
HDL: 61 mg/dL (ref >40)
LDL Cholesterol: 55 mg/dL (ref 0–99)
Total CHOL/HDL Ratio: 2.1 RATIO
Triglycerides: 76 mg/dL (ref <150)
VLDL: 15 mg/dL (ref 0–40)

## 2020-02-19 LAB — RESPIRATORY PANEL BY RT PCR (FLU A&B, COVID)
Influenza A by PCR: NEGATIVE
Influenza B by PCR: NEGATIVE
SARS Coronavirus 2 by RT PCR: NEGATIVE

## 2020-02-19 LAB — POC SARS CORONAVIRUS 2 AG: SARS Coronavirus 2 Ag: NEGATIVE

## 2020-02-19 LAB — MAGNESIUM: Magnesium: 2.2 mg/dL (ref 1.7–2.4)

## 2020-02-19 LAB — SALICYLATE LEVEL: Salicylate Lvl: <7 mg/dL — ABNORMAL LOW (ref 7.0–30.0)

## 2020-02-19 LAB — TSH: TSH: 1.898 u[IU]/mL (ref 0.350–4.500)

## 2020-02-19 MED ORDER — MELATONIN 5 MG PO TABS
10.0000 mg | ORAL_TABLET | Freq: Every day | ORAL | Status: DC
  Administered 2020-02-19 – 2020-02-20 (×3): 10 mg via ORAL
  Filled 2020-02-19 (×3): qty 2

## 2020-02-19 MED ORDER — NICOTINE 21 MG/24HR TD PT24
21.0000 mg | MEDICATED_PATCH | Freq: Once | TRANSDERMAL | Status: DC
  Administered 2020-02-19: 21 mg via TRANSDERMAL
  Filled 2020-02-19: qty 1

## 2020-02-19 MED ORDER — OLANZAPINE 5 MG PO TABS
5.0000 mg | ORAL_TABLET | Freq: Every day | ORAL | Status: DC
  Administered 2020-02-20 – 2020-02-21 (×2): 5 mg via ORAL
  Filled 2020-02-19 (×6): qty 1

## 2020-02-19 MED ORDER — CLONAZEPAM 0.5 MG PO TABS
1.0000 mg | ORAL_TABLET | Freq: Two times a day (BID) | ORAL | Status: DC
  Administered 2020-02-19 – 2020-02-21 (×5): 1 mg via ORAL
  Filled 2020-02-19 (×4): qty 2

## 2020-02-19 MED ORDER — IBUPROFEN 400 MG PO TABS
600.0000 mg | ORAL_TABLET | Freq: Four times a day (QID) | ORAL | Status: DC | PRN
  Administered 2020-02-20: 600 mg via ORAL
  Filled 2020-02-19: qty 1

## 2020-02-19 MED ORDER — GABAPENTIN 300 MG PO CAPS: 300.0000 mg | ORAL_CAPSULE | Freq: Three times a day (TID) | ORAL | Status: DC

## 2020-02-19 MED ORDER — CLONAZEPAM 1 MG PO TABS
1.0000 mg | ORAL_TABLET | Freq: Two times a day (BID) | ORAL | Status: DC
  Administered 2020-02-19: 1 mg via ORAL
  Filled 2020-02-19: qty 1

## 2020-02-19 MED ORDER — CLONAZEPAM 1 MG PO TABS: 1.0000 mg | ORAL_TABLET | Freq: Two times a day (BID) | ORAL | 0 refills | Status: DC

## 2020-02-19 MED ORDER — OLANZAPINE 5 MG PO TABS: 5.0000 mg | ORAL_TABLET | Freq: Every day | ORAL | 2 refills | Status: DC

## 2020-02-19 MED ORDER — TRAZODONE HCL 50 MG PO TABS: 50.0000 mg | ORAL_TABLET | Freq: Every evening | ORAL | Status: DC | PRN

## 2020-02-19 MED ORDER — HYDROCODONE-ACETAMINOPHEN 5-325 MG PO TABS
1.0000 | ORAL_TABLET | Freq: Once | ORAL | Status: AC
  Administered 2020-02-19: 1 via ORAL
  Filled 2020-02-19: qty 1

## 2020-02-19 MED ORDER — DIVALPROEX SODIUM ER 500 MG PO TB24
500.0000 mg | ORAL_TABLET | Freq: Every day | ORAL | Status: DC
  Administered 2020-02-19 – 2020-02-21 (×3): 500 mg via ORAL
  Filled 2020-02-19 (×5): qty 1

## 2020-02-19 MED ORDER — DIVALPROEX SODIUM ER 500 MG PO TB24
500.0000 mg | ORAL_TABLET | Freq: Every day | ORAL | Status: DC
  Administered 2020-02-19: 500 mg via ORAL
  Filled 2020-02-19: qty 1
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
## 2020-02-19 NOTE — ED Notes (Signed)
Pt changed into wine colored scrubs with assistance of EMT and 2 [employer] officers. Belongings placed in personals bag and labeled accordingly.
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
## 2020-02-19 NOTE — ED Provider Notes (Signed)
Ronald [name] y.o., male patient agreed to be admitted to continuous assessment at [facility] then changed his mind stating he wanted to leave.  Calling his mother on phone telling her to com pick him.  Mother called back and stating that something is going on with patient and feels that he is a danger to himself.  Not only did she find him in closed refrigerator but also found knives in patients room.  In the process of petition and IVC patient will be transferred to ED.     Spoke to Dr. Rodena Medin at [facility] ED report given.  Patient to be transferred to [facility] ED once IVC completed, served.
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
## 2020-02-20 DIAGNOSIS — F1910 Other psychoactive substance abuse, uncomplicated: Secondary | ICD-10-CM | POA: Diagnosis present

## 2020-02-20 DIAGNOSIS — F1520 Other stimulant dependence, uncomplicated: Secondary | ICD-10-CM | POA: Diagnosis present

## 2020-02-20 DIAGNOSIS — F1410 Cocaine abuse, uncomplicated: Secondary | ICD-10-CM | POA: Diagnosis present

## 2020-02-20 DIAGNOSIS — F1510 Other stimulant abuse, uncomplicated: Secondary | ICD-10-CM | POA: Diagnosis present

## 2020-02-20 LAB — VALPROIC ACID LEVEL: Valproic Acid Lvl: 34 ug/mL — ABNORMAL LOW (ref 50.0–100.0)

## 2020-02-20 LAB — AMMONIA: Ammonia: 39 umol/L — ABNORMAL HIGH (ref 9–35)

## 2020-02-20 MED ORDER — HALOPERIDOL LACTATE 5 MG/ML IJ SOLN
5.0000 mg | Freq: Once | INTRAMUSCULAR | Status: AC
  Administered 2020-02-20: 5 mg via INTRAMUSCULAR
  Filled 2020-02-20: qty 1

## 2020-02-20 MED ORDER — LORAZEPAM 2 MG/ML IJ SOLN
2.0000 mg | Freq: Once | INTRAMUSCULAR | Status: AC
  Administered 2020-02-20: 2 mg via INTRAMUSCULAR
  Filled 2020-02-20: qty 1

## 2020-02-20 NOTE — ED Notes (Signed)
Pt just woke up

## 2020-02-20 NOTE — ED Notes (Addendum)
This nurse saw sitter walking hurriedly in yellow after this nurse came out of another pt room. According to Sena Hitch, the sitter while the nurse was in pt room, " Pt got up, stood and walked to the nurse's station. He attempted to grab phone. Pt was counseled by Clydie Braun, RN that he could not make another phone call or have a visitor. Pt then walked to towards his bed and then claimed he needed to go to the rest room, at which point he began to run." Pt is no long at [facility] facility. This nurse pressed the distress button to alert security, RN Morrie Sheldon ran outside to attempted to find pt. [employer] and security notified.

## 2020-02-20 NOTE — ED Notes (Signed)
Pt ran outside. Security notified. EDP aware.

## 2020-02-20 NOTE — ED Notes (Signed)
Pt belongings locked in locker 5 
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
## 2020-02-20 NOTE — ED Notes (Signed)
Pt is making a phone call to his mother, [employer] is speaking to his mother per pt request & letting the mother ask questions.

## 2020-02-20 NOTE — ED Notes (Signed)
This nurse spoke to mother [name] on phone. Pt is at friend's address. [employer] notified.

## 2020-02-20 NOTE — ED Notes (Signed)
Pt remained very guarded & nervous, jittery in his hand movements. Standing in the doorway of his room talking to [facility] & security. He was changed into burgundy scrubs, his belongings were bagged & put into locker, security wanded him.

## 2020-02-20 NOTE — ED Notes (Signed)
Pt's lunch arrived 

## 2020-02-20 NOTE — ED Notes (Signed)
Pt given a cup of apple juice.  

## 2020-02-20 NOTE — ED Notes (Signed)
TTS in use at this time. 

## 2020-02-20 NOTE — ED Notes (Signed)
[phone] Ronald [name] mother would like a update

## 2020-02-20 NOTE — ED Notes (Signed)
Pt's mother, [name] [phone] called and wanted someone to explain the process to the pt. I explained to her, at this time we have no visitors. And the RN / pt would call if and when pt is transported out.

## 2020-02-20 NOTE — ED Notes (Signed)
Pt's lunch ordered 
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
## 2020-02-20 NOTE — Progress Notes (Signed)
Patient meets criteria for inpatient treatment. There are no appropriate beds available at [facility]. CSW faxed referrals to the following facilities for review:  [facility], [facility], [address], [place], [place], [facility], [place], [address], [place], [facility], [place], [place], [place], [place]  TTS will continue to seek bed placement.   Trula Slade, MSW, LCSW Clinical Social Worker 02/20/2020 5:02 PM

## 2020-02-20 NOTE — ED Notes (Signed)
Informed Paige - RN of pt's BP and pulse.

## 2020-02-20 NOTE — ED Notes (Signed)
Pt becoming increasingly agitated. Pacing around. Pt keeps coming up to nurses station.

## 2020-02-21 ENCOUNTER — Encounter: Payer: Self-pay | Admitting: Registered Nurse

## 2020-02-21 ENCOUNTER — Other Ambulatory Visit: Payer: Self-pay

## 2020-02-21 ENCOUNTER — Encounter (HOSPITAL_COMMUNITY): Payer: Self-pay | Admitting: Psychiatry

## 2020-02-21 ENCOUNTER — Other Ambulatory Visit: Payer: Self-pay | Admitting: Psychiatric/Mental Health

## 2020-02-21 ENCOUNTER — Inpatient Hospital Stay (HOSPITAL_COMMUNITY)
Admission: AD | Admit: 2020-02-21 | Discharge: 2020-02-24 | DRG: 897 | Disposition: A | Discharge status: Home / Self Care | Payer: No Payment, Other | Source: Intra-hospital | Attending: Psychiatry | Admitting: Psychiatry

## 2020-02-21 DIAGNOSIS — F411 Generalized anxiety disorder: Secondary | ICD-10-CM | POA: Diagnosis present

## 2020-02-21 DIAGNOSIS — F3160 Bipolar disorder, current episode mixed, unspecified: Secondary | ICD-10-CM | POA: Diagnosis present

## 2020-02-21 DIAGNOSIS — F17220 Nicotine dependence, chewing tobacco, uncomplicated: Secondary | ICD-10-CM | POA: Diagnosis present

## 2020-02-21 DIAGNOSIS — F15159 Other stimulant abuse with stimulant-induced psychotic disorder, unspecified: Principal | ICD-10-CM | POA: Diagnosis present

## 2020-02-21 DIAGNOSIS — Z79899 Other long term (current) drug therapy: Secondary | ICD-10-CM

## 2020-02-21 DIAGNOSIS — F1210 Cannabis abuse, uncomplicated: Secondary | ICD-10-CM | POA: Diagnosis present

## 2020-02-21 DIAGNOSIS — F1510 Other stimulant abuse, uncomplicated: Secondary | ICD-10-CM | POA: Diagnosis present

## 2020-02-21 DIAGNOSIS — F200 Paranoid schizophrenia: Secondary | ICD-10-CM | POA: Diagnosis present

## 2020-02-21 DIAGNOSIS — F1994 Other psychoactive substance use, unspecified with psychoactive substance-induced mood disorder: Secondary | ICD-10-CM | POA: Diagnosis present

## 2020-02-21 DIAGNOSIS — Z888 Allergy status to other drugs, medicaments and biological substances status: Secondary | ICD-10-CM

## 2020-02-21 DIAGNOSIS — F1410 Cocaine abuse, uncomplicated: Secondary | ICD-10-CM | POA: Diagnosis present

## 2020-02-21 DIAGNOSIS — F4310 Post-traumatic stress disorder, unspecified: Secondary | ICD-10-CM | POA: Diagnosis present

## 2020-02-21 DIAGNOSIS — M1990 Unspecified osteoarthritis, unspecified site: Secondary | ICD-10-CM | POA: Diagnosis present

## 2020-02-21 DIAGNOSIS — F39 Unspecified mood [affective] disorder: Secondary | ICD-10-CM | POA: Diagnosis present

## 2020-02-21 DIAGNOSIS — F302 Manic episode, severe with psychotic symptoms: Secondary | ICD-10-CM | POA: Diagnosis present

## 2020-02-21 DIAGNOSIS — F1910 Other psychoactive substance abuse, uncomplicated: Secondary | ICD-10-CM | POA: Diagnosis present

## 2020-02-21 DIAGNOSIS — Z880 Allergy status to penicillin: Secondary | ICD-10-CM

## 2020-02-21 DIAGNOSIS — F419 Anxiety disorder, unspecified: Secondary | ICD-10-CM | POA: Diagnosis present

## 2020-02-21 MED ORDER — OLANZAPINE 10 MG PO TBDP
10.0000 mg | ORAL_TABLET | Freq: Three times a day (TID) | ORAL | Status: DC | PRN
  Administered 2020-02-22: 10 mg via ORAL
  Filled 2020-02-21: qty 1

## 2020-02-21 MED ORDER — OLANZAPINE 5 MG PO TABS
5.0000 mg | ORAL_TABLET | Freq: Every day | ORAL | Status: DC
  Administered 2020-02-22 – 2020-02-23 (×2): 5 mg via ORAL
  Filled 2020-02-21 (×3): qty 1

## 2020-02-21 MED ORDER — DIVALPROEX SODIUM ER 500 MG PO TB24
500.0000 mg | ORAL_TABLET | Freq: Every day | ORAL | Status: DC
  Administered 2020-02-22 – 2020-02-24 (×3): 500 mg via ORAL
  Filled 2020-02-21 (×4): qty 1

## 2020-02-21 MED ORDER — ZIPRASIDONE MESYLATE 20 MG IM SOLR: 20.0000 mg | INTRAMUSCULAR | Status: DC | PRN

## 2020-02-21 MED ORDER — ALUM & MAG HYDROXIDE-SIMETH 200-200-20 MG/5ML PO SUSP: 30.0000 mL | ORAL | Status: DC | PRN

## 2020-02-21 MED ORDER — ONDANSETRON 4 MG PO TBDP: 4.0000 mg | ORAL_TABLET | Freq: Four times a day (QID) | ORAL | Status: DC | PRN

## 2020-02-21 MED ORDER — ADULT MULTIVITAMIN W/MINERALS CH
1.0000 | ORAL_TABLET | Freq: Every day | ORAL | Status: DC
  Administered 2020-02-21: 1 via ORAL
  Filled 2020-02-21: qty 1

## 2020-02-21 MED ORDER — NICOTINE POLACRILEX 2 MG MT GUM: 2.0000 mg | CHEWING_GUM | OROMUCOSAL | Status: DC | PRN

## 2020-02-21 MED ORDER — LOPERAMIDE HCL 2 MG PO CAPS: 2.0000 mg | ORAL_CAPSULE | ORAL | Status: DC | PRN

## 2020-02-21 MED ORDER — POLYETHYLENE GLYCOL 3350 17 G PO PACK: 17.0000 g | PACK | Freq: Every day | ORAL | Status: DC | PRN

## 2020-02-21 MED ORDER — LORAZEPAM 1 MG PO TABS: 1.0000 mg | ORAL_TABLET | Freq: Four times a day (QID) | ORAL | Status: DC | PRN

## 2020-02-21 MED ORDER — LORAZEPAM 1 MG PO TABS: 1.0000 mg | ORAL_TABLET | ORAL | Status: DC | PRN

## 2020-02-21 MED ORDER — ADULT MULTIVITAMIN W/MINERALS CH
1.0000 | ORAL_TABLET | Freq: Every day | ORAL | Status: DC
  Administered 2020-02-22 – 2020-02-24 (×3): 1 via ORAL
  Filled 2020-02-21 (×5): qty 1

## 2020-02-21 MED ORDER — MELATONIN 5 MG PO TABS
10.0000 mg | ORAL_TABLET | Freq: Every day | ORAL | Status: DC
  Administered 2020-02-21 – 2020-02-23 (×3): 10 mg via ORAL
  Filled 2020-02-21 (×5): qty 2

## 2020-02-21 MED ORDER — IBUPROFEN 600 MG PO TABS: 600.0000 mg | ORAL_TABLET | Freq: Four times a day (QID) | ORAL | Status: DC | PRN

## 2020-02-21 MED ORDER — THIAMINE HCL 100 MG PO TABS
100.0000 mg | ORAL_TABLET | Freq: Every day | ORAL | Status: DC
  Administered 2020-02-22 – 2020-02-24 (×3): 100 mg via ORAL
  Filled 2020-02-21 (×4): qty 1

## 2020-02-21 MED ORDER — TRAZODONE HCL 50 MG PO TABS
50.0000 mg | ORAL_TABLET | Freq: Every evening | ORAL | Status: DC | PRN
  Administered 2020-02-21 – 2020-02-23 (×4): 50 mg via ORAL
  Filled 2020-02-21: qty 14
  Filled 2020-02-21 (×4): qty 1

## 2020-02-21 MED ORDER — MAGNESIUM HYDROXIDE 400 MG/5ML PO SUSP: 30.0000 mL | Freq: Every day | ORAL | Status: DC | PRN

## 2020-02-21 MED ORDER — CLONAZEPAM 1 MG PO TABS
1.0000 mg | ORAL_TABLET | Freq: Two times a day (BID) | ORAL | Status: DC
  Administered 2020-02-21 – 2020-02-22 (×2): 1 mg via ORAL
  Filled 2020-02-21 (×2): qty 1

## 2020-02-21 NOTE — Tx Team (Signed)
Initial Treatment Plan 02/21/2020 6:50 PM [name] [id number]    PATIENT STRESSORS: Health problems Marital or family conflict Medication change or noncompliance Substance abuse   PATIENT STRENGTHS: Average or above average intelligence Supportive family/friends   PATIENT IDENTIFIED PROBLEMS: Polysubstance Abuse  Paranoia  Anxiety                 DISCHARGE CRITERIA:  Ability to meet basic life and health needs Adequate post-discharge living arrangements  PRELIMINARY DISCHARGE PLAN: Attend aftercare/continuing care group Outpatient therapy Return to previous living arrangement  PATIENT/FAMILY INVOLVEMENT: This treatment plan has been presented to and reviewed with the patient, Ronald [name].  The patient and family have been given the opportunity to ask questions and make suggestions.  Clarene Critchley, RN 02/21/2020, 6:50 PM

## 2020-02-21 NOTE — ED Notes (Signed)
Patient given a Snack, Drink, and [employer] [employer].
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
## 2020-02-21 NOTE — ED Notes (Signed)
Patient using phone to call Mom

## 2020-02-21 NOTE — Progress Notes (Signed)
CSW received a call from the [facility] at [facility] stating the patient has been offered a bed and has been accepted and that the pt can arrive on11/8 after 4pm*.  The pt's accepting doctor is Dr. Jola Babinski.  The room number will be 503-1.  The number for report is [phone].  CSW updated the RN.  Ronald [name]. Ronald [name], Ronald [name], LCAS Clinical Social Worker Ph: [phone]

## 2020-02-21 NOTE — ED Provider Notes (Signed)
Emergency Medicine Observation Re-evaluation Note  Ronald [name] is a 39 y.o. male, seen on rounds today.  Pt initially presented to the ED for complaints of IVC Currently, the patient is resting comfortably in bed, no complaints at this time. He is requesting a multivitamin because of concerns for COVID-19 prophylaxis.  Physical Exam  BP 100/72 (BP Location: Right Arm)   Pulse 64   Temp 98.4 F (36.9 C) (Oral)   Resp 16   Ht 5\' 11"  (1.803 m)   Wt 77.6 kg   SpO2 96%   BMI 23.85 kg/m  CONSTITUTIONAL:  well-appearing, NAD NEURO:  Alert and oriented x 3, no focal deficits EYES:  pupils equal and reactive ENT/NECK:  trachea midline, no JVD CARDIO:  reg rate, reg rhythm, well-perfused PULM:  None labored breathing GI/GU:  Abdomen non-distended MSK/SPINE:  No gross deformities, no edema SKIN:  no rash obvious, atraumatic, no ecchymosis  PSYCH:  Appropriate speech and behavior  ED Course / MDM  EKG:    I have reviewed the labs performed to date as well as medications administered while in observation.  Recent changes in the last 24 hours include no acute events.  Plan  Current plan is for placement. He is awaiting placement at this time. Patient is under full IVC at this time.  Multivitamin ordered for patient.  It does appear that [facility] legal documents were scanned -- likely placement found. Social work is following.    [facility] Hubbell, DOLE 02/21/20 1242    13/08/21, MD 02/21/20 1315

## 2020-02-21 NOTE — Progress Notes (Signed)
Admission Note: Patient is a 39 year old male admitted to the unit under IVC by mother for declining mental health, paranoia and polysubstance abuse.  UDS positive for Amphetamines, Benzos, Cocaine and THC.  Patient is alert and oriented x 4.  Presents with anxious affect and mood.  Appears restless and fidgety with pressured speech.  States he is here due to panic attack and anxiety.  Admission plan of care reviewed and consent signed.  Skin assessment and personal belongings completed.  Abrasion noted on both arms.  No contraband found.  Patient oriented to the unit,staff and room.  Routine safety checks initiated.  Verbalizes understanding of unit rules/protocols.  Patient is safe on the unit.

## 2020-02-21 NOTE — ED Notes (Signed)
Diet was ordered for Lunch. 

## 2020-02-22 DIAGNOSIS — F1994 Other psychoactive substance use, unspecified with psychoactive substance-induced mood disorder: Secondary | ICD-10-CM

## 2020-02-22 MED ORDER — NICOTINE 21 MG/24HR TD PT24
21.0000 mg | MEDICATED_PATCH | Freq: Every day | TRANSDERMAL | Status: DC
  Administered 2020-02-22 – 2020-02-24 (×3): 21 mg via TRANSDERMAL
  Filled 2020-02-22 (×4): qty 1

## 2020-02-22 MED ORDER — CLONAZEPAM 1 MG PO TABS
1.0000 mg | ORAL_TABLET | Freq: Two times a day (BID) | ORAL | Status: DC | PRN
  Administered 2020-02-22 – 2020-02-24 (×4): 1 mg via ORAL
  Filled 2020-02-22 (×4): qty 1

## 2020-02-22 NOTE — Progress Notes (Signed)
DAR NOTE: Patient presents with anxious affect and mood.  Denies suicidal thoughts, pain, auditory and visual hallucinations.  Described energy level as normal and concentration as good.  Rates depression at 0, hopelessness at 0, and anxiety at 2.  Maintained on routine safety checks.  Medications given as prescribed.  Support and encouragement offered as needed.  States goal for today is "discharge plan."  Patient stayed in his room for majority of this shift.  Patient is safe on the unit. Offered no complaint.
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
## 2020-02-22 NOTE — Progress Notes (Signed)
   02/22/20 2000  Psych Admission Type (Psych Patients Only)  Admission Status Involuntary  Psychosocial Assessment  Patient Complaints None  Eye Contact Avertive  Facial Expression Anxious  Affect Anxious  Speech Logical/coherent  Interaction Assertive  Motor Activity Pacing;Fidgety  Appearance/Hygiene Unremarkable  Behavior Characteristics Cooperative;Anxious  Mood Anxious;Pleasant  Thought Process  Coherency WDL  Content WDL  Delusions None reported or observed  Perception WDL  Hallucination None reported or observed  Judgment Impaired  Confusion None  Danger to Self  Current suicidal ideation? Denies  Danger to Others  Danger to Others None reported or observed

## 2020-02-22 NOTE — [facility] Group Notes (Addendum)
Patient was made aware of orientation & goal setting group this morning, but he chose did not attend.
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
## 2020-02-22 NOTE — [facility] Suicide Risk Assessment (Signed)
[facility] INPATIENT:  Family/Significant Other Suicide Prevention Education  Refusal of Consents:  Suicide Prevention Education:  Patient Refusal for Family/Significant Other Suicide Prevention Education: The patient Ronald [name] has refused to provide written consent for family/significant other to be provided Family/Significant Other Suicide Prevention Education during admission and/or prior to discharge.  Physician notified.   SPE completed with patient, as patient refused to consent to family contact. SPI pamphlet provided to pt and pt was encouraged to share information with support network, ask questions, and talk about any concerns relating to SPE. Patient denies access to guns/firearms and verbalized understanding of information provided. Mobile Crisis information also provided to patient.   Ruthann Cancer MSW, LCSW Clincal Social Worker  [facility]

## 2020-02-22 NOTE — [facility] Counselor (Signed)
Adult Comprehensive Assessment  Patient ID: Ronald [name], male   DOB: 31-Mar-1981, 39 y.o.   MRN: [id number]  Information Source: Information source: Patient  Current Stressors:  Patient states their primary concerns and needs for treatment are:: "To get counseling and therapy" Patient states their goals for this hospitilization and ongoing recovery are:: "To get discharged" Educational / Learning stressors: Denies stressor Employment / Job issues: Patient states that he works at a "couple of different places" however,  declined to specify Family Relationships: Denies Metallurgist / Lack of resources (include bankruptcy): Patient denies stressor Housing / Lack of housing: Denies stressor Physical health (include injuries & life threatening diseases): Denies stressor Social relationships: Denies stressor Substance abuse: Denies stressor Bereavement / Loss: Denies stressor  Living/Environment/Situation:  Living Arrangements: Parent Living conditions (as described by patient or guardian): "Good" Who else lives in the home?: Mother How long has patient lived in current situation?: "A few years" What is atmosphere in current home: Comfortable  Family History:  Marital status: Single Are you sexually active?: No What is your sexual orientation?: Heterosexual Has your sexual activity been affected by drugs, alcohol, medication, or emotional stress?: Denies Does patient have children?: No  Childhood History:  By whom was/is the patient raised?: Both parents Additional childhood history information: stable childhood until loss of brother, father, and friend (brother and friend were murdered) Description of patient's relationship with caregiver when they were a child: Good Patient's description of current relationship with people who raised him/her: Good with mother. Father is deceased How were you disciplined when you got in trouble as a child/adolescent?: Spanked, grounded Does  patient have siblings?: Yes Number of Siblings: 1 Description of patient's current relationship with siblings: brother (deceased) Did patient suffer any verbal/emotional/physical/sexual abuse as a child?:  (Patient declined to discuss.) Did patient suffer from severe childhood neglect?: No Has patient ever been sexually abused/assaulted/raped as an adolescent or adult?: No Was the patient ever a victim of a crime or a disaster?: No Witnessed domestic violence?:  (Patient declined to discuss)  Education:  Highest grade of school patient has completed: "Above a Education administrator" Currently a student?: No Learning disability?: No  Employment/Work Situation:   Employment situation: Employed Where is patient currently employed?: Declined to discuss stating it is not pertinent How long has patient been employed?: A few months Patient's job has been impacted by current illness: No What is the longest time patient has a held a job?: 5 years Where was the patient employed at that time?: Patient states this information is not pertinent Has patient ever been in the [employer]?: No  Financial Resources:   Financial resources: Income from employment Does patient have a representative payee or guardian?: No  Alcohol/Substance Abuse:   What has been your use of drugs/alcohol within the last 12 months?: Denies all substance use, however per UDS patient was positive for Cocaine, Ampetamines, THC, and Benzodiazapines If attempted suicide, did drugs/alcohol play a role in this?: No Alcohol/Substance Abuse Treatment Hx: Denies past history Has alcohol/substance abuse ever caused legal problems?: No  Social Support System:   Patient's Community Support System: Good Describe Community Support System: Family Type of faith/religion: None How does patient's faith help to cope with current illness?: n/a  Leisure/Recreation:   Do You Have Hobbies?: Yes Leisure and Hobbies: Exercise  Strengths/Needs:   What is  the patient's perception of their strengths?: "I'm lucky" Patient states they can use these personal strengths during their treatment to contribute to their recovery:  UTA Patient states these barriers may affect/interfere with their treatment: None Patient states these barriers may affect their return to the community: None Other important information patient would like considered in planning for their treatment: None  Discharge Plan:   Currently receiving community mental health services: Yes (From Whom) Patient states concerns and preferences for aftercare planning are: Patient declined informing CSW of current provider, but states he is content with current services Patient states they will know when they are safe and ready for discharge when: Feels ready now Does patient have access to transportation?: Yes Does patient have financial barriers related to discharge medications?: Yes Patient description of barriers related to discharge medications: No insurance Will patient be returning to same living situation after discharge?: Yes  Summary/Recommendations:   Summary and Recommendations (to be completed by the evaluator): Ronald [name], 39 y.o., male patient presents to [facility] as a walk in accompanied by his mother with complaints of PTSD, worsening anxiety and panic attacks.  Patient seen face to face by this provider, consulted with Dr. Jannifer Franklin; and chart reviewed on 02/19/20.  On evaluation Ronald [name] reports he has had a worsening of anxiety for several week and now panic attacks. Patient sates that he would like referral for therapy.  Patient denies current outpatient psychiatric services, psychiatric hospitalization and prior suicide attempt.  Patient reports that he just started taking Depakote that is prescribed by his primary care provider.  Patient reports that he is currently unemployed "I'm in between jobs, the last one just didn't work out."  Patient lives with his mother but  did not want staff to give his mother any information stating that his mother was a Sales promotion account executive."  While here, Ronald [name] can benefit from crisis stabilization, medication management, therapeutic milieu, and referrals for services.  Ronald [name]. 02/22/2020

## 2020-02-22 NOTE — Progress Notes (Signed)
Adult Psychoeducational Group Note  Date:  02/22/2020 Time:  1:06 AM  Group Topic/Focus:  Wrap-Up Group:   The focus of this group is to help patients review their daily goal of treatment and discuss progress on daily workbooks.  Participation Level:  Active  Participation Quality:  Appropriate  Affect:  Appropriate  Cognitive:  Appropriate  Insight: Appropriate  Engagement in Group:  Developing/Improving  Modes of Intervention:  Discussion  Additional Comments: Pt stated his goal for today was to focus on his treatment plan. Pt stated he accomplished his goal today. Pt stated he was able to talk with his doctor and social worker about his care today. Pt stated he is still adjusting to because he was a new admission today.  Pt rated his overall day a 10. Pt stated been able to contact his mother today improved his overall day. Pt stated his relationship with his family and support system needs to be improved.  Pt stated he was able to attend all meals. Pt stated he took all medications provided today. Pt stated his appetite was pretty good today. Pt rated sleep last night was pretty good. Pt stated he was in some physical pain today. Pt stated his right side of his ribs were very sore. Pt rated the pain level as a 5. Pt nurse was made aware of the situation.  Pt deny auditory or visual hallucinations. Pt denies thoughts of harming himself or others. Pt stated he would alert staff if anything changes.   Felipa Furnace 02/22/2020, 1:06 AM

## 2020-02-23 DIAGNOSIS — F1994 Other psychoactive substance use, unspecified with psychoactive substance-induced mood disorder: Secondary | ICD-10-CM | POA: Diagnosis not present

## 2020-02-23 MED ORDER — OLANZAPINE 10 MG PO TABS
10.0000 mg | ORAL_TABLET | Freq: Every day | ORAL | Status: DC
  Administered 2020-02-24: 10 mg via ORAL
  Filled 2020-02-23 (×2): qty 1

## 2020-02-23 MED ORDER — OLANZAPINE 10 MG PO TABS
10.0000 mg | ORAL_TABLET | Freq: Every day | ORAL | Status: DC
  Administered 2020-02-23: 10 mg via ORAL
  Filled 2020-02-23 (×2): qty 1

## 2020-02-23 MED ORDER — OLANZAPINE 5 MG PO TABS
5.0000 mg | ORAL_TABLET | Freq: Once | ORAL | Status: AC
  Administered 2020-02-23: 5 mg via ORAL
  Filled 2020-02-23: qty 2
  Filled 2020-02-23: qty 1

## 2020-02-23 NOTE — [facility] Group Notes (Signed)
[facility] LCSW Group Therapy  02/23/2020 2:19 PM  Type of Therapy:  Group Therapy  Participation Level:  Active  Participation Quality:  Appropriate  Affect:  Appropriate  Cognitive:  Appropriate  Insight:  Improving  Engagement in Therapy:  Engaged  Modes of Intervention:  Activity, Exploration and Socialization  Summary of Progress/Problems: Patient attended and participated in group. Patient was able to interact with his peers and play basketball during group.  Keiarah Orlowski A Amil Bouwman 02/23/2020, 2:19 PM

## 2020-02-23 NOTE — Progress Notes (Addendum)
   02/23/20 2030  Psych Admission Type (Psych Patients Only)  Admission Status Involuntary  Psychosocial Assessment  Patient Complaints None  Eye Contact Brief  Facial Expression Anxious  Affect Anxious  Speech Logical/coherent  Interaction Assertive  Motor Activity Fidgety  Appearance/Hygiene Unremarkable  Behavior Characteristics Cooperative;Anxious  Mood Anxious  Thought Process  Coherency WDL  Content WDL  Delusions None reported or observed  Perception WDL  Hallucination None reported or observed  Judgment Impaired  Confusion None  Danger to Self  Current suicidal ideation? Denies  Danger to Others  Danger to Others None reported or observed   Pt denies SI, HI, AVH and pain. Seen interacting in dayroom and attending wrap up group this evening.

## 2020-02-23 NOTE — Progress Notes (Signed)
Pt refused his lab work this morning.

## 2020-02-23 NOTE — Progress Notes (Addendum)
Received call from pt's mother about their recent phone conversation. Pt mother, Ronald [name] [phone]), says that pt gets paranoid d/t his mental issues. "He has been like this before. I just wanted to let you know that he gets paranoid and nothing calms him down. He will try to run. I know when it started. Did he tell you his brother was murdered? The Christmas after my son was murdered, Forbes came home from law school in [place] and something happened. I went to counseling, got medication and the whole nine to get better. He went to counseling one time in [place] and never went back. He hides my knives around the house because he is paranoid someone is after him. It's his PTSD. I found a paring knife at the bottom of my washing machine. It was in his clothing. Can you give him something to help him sleep?"   Pt mother assured that pt is being taken care of and thanked her for her insight into pt's behavior. Pt given Zyprexa 10 mg and Klonopin 1 mg. Pt still pacing the halls and not wanting to lay down. Will continue to monitor.

## 2020-02-23 NOTE — [facility] Group Notes (Signed)
[facility] Group Notes:  (Nursing/MHT/Case Management/Adjunct)  Date:  02/23/2020  Time:  11:29 AM  Type of Therapy:  Group Therapy  Participation Level:  Active  Participation Quality:  Appropriate  Affect:  Appropriate  Cognitive:  Appropriate  Insight:  Appropriate and Good  Engagement in Group:  Engaged  Modes of Intervention:  Orientation  Summary of Progress/Problems: Pt stated that he slept good and is going to follow directions in order to get the best help possible. Stated that he is still suffering from PTSD from deaths he has had in the family.   Jaye Saal J Darcey Cardy 02/23/2020, 11:29 AM

## 2020-02-23 NOTE — Tx Team (Signed)
Interdisciplinary Treatment and Diagnostic Plan Update  02/23/2020 Time of Session: 9:00am Ronald [name] MRN: [id number]  Principal Diagnosis: Substance induced mood disorder (HCC)  Secondary Diagnoses: Principal Problem:   Substance induced mood disorder (HCC) Active Problems:   Bipolar I disorder, single manic episode, severe with psychotic features (HCC)   Mood disorder (HCC)   PTSD (post-traumatic stress disorder)   Schizophrenia, paranoid type (HCC)   Methamphetamine use disorder, mild, abuse (HCC)   Cocaine use disorder (HCC)   Polysubstance abuse (HCC)   Current Medications:  Current Facility-Administered Medications  Medication Dose Route Frequency Provider Last Rate Last Admin  . alum & mag hydroxide-simeth (MAALOX/MYLANTA) 200-200-20 MG/5ML suspension 30 mL  30 mL Oral Q4H PRN Aldean Baker, NP      . clonazePAM Scarlette Calico) tablet 1 mg  1 mg Oral BID PRN Bobbye Morton, MD   1 mg at 02/23/20 0812  . divalproex (DEPAKOTE ER) 24 hr tablet 500 mg  500 mg Oral Daily Aldean Baker, NP   500 mg at 02/23/20 3888  . ibuprofen (ADVIL) tablet 600 mg  600 mg Oral Q6H PRN Aldean Baker, NP      . loperamide (IMODIUM) capsule 2-4 mg  2-4 mg Oral PRN Aldean Baker, NP      . OLANZapine zydis (ZYPREXA) disintegrating tablet 10 mg  10 mg Oral Q8H PRN Aldean Baker, NP   10 mg at 02/22/20 2138   And  . LORazepam (ATIVAN) tablet 1 mg  1 mg Oral PRN Aldean Baker, NP       And  . ziprasidone (GEODON) injection 20 mg  20 mg Intramuscular PRN Aldean Baker, NP      . LORazepam (ATIVAN) tablet 1 mg  1 mg Oral Q6H PRN Aldean Baker, NP      . magnesium hydroxide (MILK OF MAGNESIA) suspension 30 mL  30 mL Oral Daily PRN Aldean Baker, NP      . melatonin tablet 10 mg  10 mg Oral QHS Aldean Baker, NP   10 mg at 02/22/20 2037  . multivitamin with minerals tablet 1 tablet  1 tablet Oral Daily Aldean Baker, NP   1 tablet at 02/23/20 2800  . nicotine (NICODERM CQ - dosed in mg/24  hours) patch 21 mg  21 mg Transdermal Daily Eliseo Gum B, MD   21 mg at 02/23/20 0813  . nicotine polacrilex (NICORETTE) gum 2 mg  2 mg Oral PRN Estella Husk, MD      . Melene Muller ON 02/24/2020] OLANZapine (ZYPREXA) tablet 10 mg  10 mg Oral Daily Eliseo Gum B, MD      . OLANZapine (ZYPREXA) tablet 10 mg  10 mg Oral QHS Eliseo Gum B, MD      . OLANZapine (ZYPREXA) tablet 5 mg  5 mg Oral Once Eliseo Gum B, MD      . ondansetron (ZOFRAN-ODT) disintegrating tablet 4 mg  4 mg Oral Q6H PRN Aldean Baker, NP      . thiamine tablet 100 mg  100 mg Oral Daily Aldean Baker, NP   100 mg at 02/23/20 3491  . traZODone (DESYREL) tablet 50 mg  50 mg Oral QHS PRN,MR X 1 Aldean Baker, NP   50 mg at 02/22/20 2038   PTA Medications: Medications Prior to Admission  Medication Sig Dispense Refill Last Dose  . clonazePAM (KLONOPIN) 1 MG tablet Take 1 tablet (1 mg total) by mouth  2 (two) times daily. 30 tablet 0   . divalproex (DEPAKOTE ER) 500 MG 24 hr tablet Take 1 tablet (500 mg total) by mouth daily. 90 tablet 1   . Melatonin 10 MG CAPS Take 10 mg by mouth at bedtime.       Patient Stressors: Health problems Marital or family conflict Medication change or noncompliance Substance abuse  Patient Strengths: Average or above average intelligence Supportive family/friends  Treatment Modalities: Medication Management, Group therapy, Case management,  1 to 1 session with clinician, Psychoeducation, Recreational therapy.   Physician Treatment Plan for Primary Diagnosis: Substance induced mood disorder (HCC) Long Term Goal(s): Improvement in symptoms so as ready for discharge Improvement in symptoms so as ready for discharge   Short Term Goals: Ability to identify changes in lifestyle to reduce recurrence of condition will improve Ability to verbalize feelings will improve Ability to maintain clinical measurements within normal limits will improve Ability to demonstrate self-control will  improve Ability to identify and develop effective coping behaviors will improve Ability to identify triggers associated with substance abuse/mental health issues will improve  Medication Management: Evaluate patient's response, side effects, and tolerance of medication regimen.  Therapeutic Interventions: 1 to 1 sessions, Unit Group sessions and Medication administration.  Evaluation of Outcomes: Progressing  Physician Treatment Plan for Secondary Diagnosis: Principal Problem:   Substance induced mood disorder (HCC) Active Problems:   Bipolar I disorder, single manic episode, severe with psychotic features (HCC)   Mood disorder (HCC)   PTSD (post-traumatic stress disorder)   Schizophrenia, paranoid type (HCC)   Methamphetamine use disorder, mild, abuse (HCC)   Cocaine use disorder (HCC)   Polysubstance abuse (HCC)  Long Term Goal(s): Improvement in symptoms so as ready for discharge Improvement in symptoms so as ready for discharge   Short Term Goals: Ability to identify changes in lifestyle to reduce recurrence of condition will improve Ability to verbalize feelings will improve Ability to maintain clinical measurements within normal limits will improve Ability to demonstrate self-control will improve Ability to identify and develop effective coping behaviors will improve Ability to identify triggers associated with substance abuse/mental health issues will improve     Medication Management: Evaluate patient's response, side effects, and tolerance of medication regimen.  Therapeutic Interventions: 1 to 1 sessions, Unit Group sessions and Medication administration.  Evaluation of Outcomes: Progressing   RN Treatment Plan for Primary Diagnosis: Substance induced mood disorder (HCC) Long Term Goal(s): Knowledge of disease and therapeutic regimen to maintain health will improve  Short Term Goals: Ability to remain free from injury will improve, Ability to verbalize frustration  and anger appropriately will improve, Ability to identify and develop effective coping behaviors will improve and Compliance with prescribed medications will improve  Medication Management: RN will administer medications as ordered by provider, will assess and evaluate patient's response and provide education to patient for prescribed medication. RN will report any adverse and/or side effects to prescribing provider.  Therapeutic Interventions: 1 on 1 counseling sessions, Psychoeducation, Medication administration, Evaluate responses to treatment, Monitor vital signs and CBGs as ordered, Perform/monitor CIWA, COWS, AIMS and Fall Risk screenings as ordered, Perform wound care treatments as ordered.  Evaluation of Outcomes: Progressing   LCSW Treatment Plan for Primary Diagnosis: Substance induced mood disorder (HCC) Long Term Goal(s): Safe transition to appropriate next level of care at discharge, Engage patient in therapeutic group addressing interpersonal concerns.  Short Term Goals: Engage patient in aftercare planning with referrals and resources, Increase social support, Identify triggers associated  with mental health/substance abuse issues and Increase skills for wellness and recovery  Therapeutic Interventions: Assess for all discharge needs, 1 to 1 time with Social worker, Explore available resources and support systems, Assess for adequacy in community support network, Educate family and significant other(s) on suicide prevention, Complete Psychosocial Assessment, Interpersonal group therapy.  Evaluation of Outcomes: Progressing   Progress in Treatment: Attending groups: No. Participating in groups: No. Taking medication as prescribed: Yes. Toleration medication: Yes. Family/Significant other contact made: No, will contact:  declined consents Patient understands diagnosis: Yes. Discussing patient identified problems/goals with staff: Yes. Medical problems stabilized or resolved:  Yes. Denies suicidal/homicidal ideation: Yes. Issues/concerns per patient self-inventory: No.   New problem(s) identified: No, Describe:  none  New Short Term/Long Term Goal(s): medication stabilization, elimination of SI thoughts, development of comprehensive mental wellness plan.   Patient Goals:  "To get medications I need"  Discharge Plan or Barriers: Patient recently admitted. CSW will continue to follow and assess for appropriate referrals and possible discharge planning.   Reason for Continuation of Hospitalization: Depression Medication stabilization Suicidal ideation  Estimated Length of Stay: 3-5 days   Attendees: Patient: Ronald [name] 02/23/2020 9:15 AM  Physician: Earlene Plater, MD 02/23/2020 9:15 AM  Nursing:  02/23/2020 9:15 AM  RN Care Manager: 02/23/2020 9:15 AM  Social Worker: Ruthann Cancer, LCSW 02/23/2020 9:15 AM  Recreational Therapist:  02/23/2020 9:15 AM  Other:  02/23/2020 9:15 AM  Other:  02/23/2020 9:15 AM  Other: 02/23/2020 9:15 AM    Scribe for Treatment Team: Otelia Santee, LCSW 02/23/2020 9:15 AM

## 2020-02-23 NOTE — Progress Notes (Signed)
   02/23/20 0617  Vital Signs  Temp 97.8 F (36.6 C)  Temp Source Oral  Pulse Rate (!) 59  Resp 18  BP (!) 130/113  BP Location Right Arm  BP Method Automatic  Patient Position (if appropriate) Sitting   D: Patent denies SI/HIAVH. Patient rated anxiety 7/10 due to a "bad dream about my brother." Pt. Attended social work  group outside.  A:  Patient took scheduled medicine.  Support and encouragement provided Routine safety checks conducted every 15 minutes. Patient  Informed to notify staff with any concerns.   R:  Safety maintained.

## 2020-02-23 NOTE — Progress Notes (Signed)
[facility] MD Progress Note  02/23/2020 8:16 AM Ronald [name]  MRN:  [id number] Subjective:  Patient reports that he slept well and his appetite is good. He reports that his mood today is fine. Patient reports that the unit makes him a bit anxious and paranoid and he was concerned that someone may harm him yesterday, but he attributes this fear to his PTSD. Patient does not endorse SI, HI, nor AVH and does contract for safety. He will attempt to go to groups today as yesterday he really wanted to catch up on sleep.  Principal Problem: Substance induced mood disorder (HCC) Diagnosis: Principal Problem:   Substance induced mood disorder (HCC) Active Problems:   Bipolar I disorder, single manic episode, severe with psychotic features (HCC)   Mood disorder (HCC)   PTSD (post-traumatic stress disorder)   Schizophrenia, paranoid type (HCC)   Methamphetamine use disorder, mild, abuse (HCC)   Cocaine use disorder (HCC)   Polysubstance abuse (HCC)  Total Time spent with patient: 15 minutes  Past Psychiatric History: See H&P  Past Medical History:  Past Medical History:  Diagnosis Date  . Anxiety   . Arthritis   . Disorder of pineal gland   . Post traumatic stress disorder     Past Surgical History:  Procedure Laterality Date  . ANKLE SURGERY     Family History: History reviewed. No pertinent family history. Family Psychiatric  History: See H&P Social History:  Social History   Substance and Sexual Activity  Alcohol Use No     Social History   Substance and Sexual Activity  Drug Use Yes  . Types: Amphetamines, Benzodiazepines, Cocaine, Marijuana, Methamphetamines   Comment: one year ago    Social History   Socioeconomic History  . Marital status: Single    Spouse name: Not on file  . Number of children: Not on file  . Years of education: Not on file  . Highest education level: Not on file  Occupational History  . Not on file  Tobacco Use  . Smoking status: Never Smoker  .  Smokeless tobacco: Current User    Types: Chew  Vaping Use  . Vaping Use: Never used  Substance and Sexual Activity  . Alcohol use: No  . Drug use: Yes    Types: Amphetamines, Benzodiazepines, Cocaine, Marijuana, Methamphetamines    Comment: one year ago  . Sexual activity: Never  Other Topics Concern  . Not on file  Social History Narrative  . Not on file   Social Determinants of Health   Financial Resource Strain:   . Difficulty of Paying Living Expenses: Not on file  Food Insecurity:   . Worried About Programme researcher, broadcasting/film/video in the Last Year: Not on file  . Ran Out of Food in the Last Year: Not on file  Transportation Needs:   . Lack of Transportation (Medical): Not on file  . Lack of Transportation (Non-Medical): Not on file  Physical Activity:   . Days of Exercise per Week: Not on file  . Minutes of Exercise per Session: Not on file  Stress:   . Feeling of Stress : Not on file  Social Connections:   . Frequency of Communication with Friends and Family: Not on file  . Frequency of Social Gatherings with Friends and Family: Not on file  . Attends Religious Services: Not on file  . Active Member of Clubs or Organizations: Not on file  . Attends Banker Meetings: Not on file  .  Marital Status: Not on file   Additional Social History:                         Sleep: Good  Appetite:  Good  Current Medications: Current Facility-Administered Medications  Medication Dose Route Frequency Provider Last Rate Last Admin  . alum & mag hydroxide-simeth (MAALOX/MYLANTA) 200-200-20 MG/5ML suspension 30 mL  30 mL Oral Q4H PRN Aldean Baker, NP      . clonazePAM Scarlette Calico) tablet 1 mg  1 mg Oral BID PRN Bobbye Morton, MD   1 mg at 02/23/20 0812  . divalproex (DEPAKOTE ER) 24 hr tablet 500 mg  500 mg Oral Daily Aldean Baker, NP   500 mg at 02/23/20 8413  . ibuprofen (ADVIL) tablet 600 mg  600 mg Oral Q6H PRN Aldean Baker, NP      . loperamide (IMODIUM)  capsule 2-4 mg  2-4 mg Oral PRN Aldean Baker, NP      . OLANZapine zydis (ZYPREXA) disintegrating tablet 10 mg  10 mg Oral Q8H PRN Aldean Baker, NP   10 mg at 02/22/20 2138   And  . LORazepam (ATIVAN) tablet 1 mg  1 mg Oral PRN Aldean Baker, NP       And  . ziprasidone (GEODON) injection 20 mg  20 mg Intramuscular PRN Aldean Baker, NP      . LORazepam (ATIVAN) tablet 1 mg  1 mg Oral Q6H PRN Aldean Baker, NP      . magnesium hydroxide (MILK OF MAGNESIA) suspension 30 mL  30 mL Oral Daily PRN Aldean Baker, NP      . melatonin tablet 10 mg  10 mg Oral QHS Aldean Baker, NP   10 mg at 02/22/20 2037  . multivitamin with minerals tablet 1 tablet  1 tablet Oral Daily Aldean Baker, NP   1 tablet at 02/23/20 2440  . nicotine (NICODERM CQ - dosed in mg/24 hours) patch 21 mg  21 mg Transdermal Daily Eliseo Gum B, MD   21 mg at 02/23/20 0813  . nicotine polacrilex (NICORETTE) gum 2 mg  2 mg Oral PRN Estella Husk, MD      . OLANZapine [facility]) tablet 5 mg  5 mg Oral Daily Aldean Baker, NP   5 mg at 02/23/20 1027  . ondansetron (ZOFRAN-ODT) disintegrating tablet 4 mg  4 mg Oral Q6H PRN Aldean Baker, NP      . thiamine tablet 100 mg  100 mg Oral Daily Aldean Baker, NP   100 mg at 02/23/20 2536  . traZODone (DESYREL) tablet 50 mg  50 mg Oral QHS PRN,MR X 1 Aldean Baker, NP   50 mg at 02/22/20 2038    Lab Results: No results found for this or any previous visit (from the past 48 hour(s)).  Blood Alcohol level:  Lab Results  Component Value Date   ETH <10 02/19/2020   ETH <10 02/19/2020    Metabolic Disorder Labs: Lab Results  Component Value Date   HGBA1C 5.0 02/19/2020   MPG 96.8 02/19/2020   No results found for: PROLACTIN Lab Results  Component Value Date   CHOL 131 02/19/2020   TRIG 76 02/19/2020   HDL 61 02/19/2020   CHOLHDL 2.1 02/19/2020   VLDL 15 02/19/2020   LDLCALC 55 02/19/2020    Physical Findings: AIMS:  , ,  ,  ,  CIWA:  CIWA-Ar Total:  0 COWS:  COWS Total Score: 6  Musculoskeletal: Strength & Muscle Tone: within normal limits Gait & Station: normal Patient leans: N/A  Psychiatric Specialty Exam: Physical Exam HENT:     Head: Normocephalic and atraumatic.  Pulmonary:     Effort: Pulmonary effort is normal.  Neurological:     Mental Status: He is alert.     Review of Systems  Cardiovascular: Negative for chest pain.  Gastrointestinal: Negative for abdominal pain.  Neurological: Negative for headaches.    Blood pressure (!) 126/92, pulse 78, temperature 97.8 F (36.6 C), temperature source Oral, resp. rate 18, height 5\' 11"  (1.803 m), weight 77.6 kg, SpO2 100 %.Body mass index is 23.85 kg/m.  General Appearance: Fairly Groomed  Eye Contact:  None  Speech:  Clear and Coherent  Volume:  Normal  Mood:  Euthymic  Affect:  Appropriate  Thought Process:  Coherent  Orientation:  NA  Thought Content:  Logical  Suicidal Thoughts:  No  Homicidal Thoughts:  No  Memory:  NA  Judgement:  Fair  Insight:  Shallow  Psychomotor Activity:  Normal  Concentration:  Concentration: Fair and Attention Span: Fair  Recall:  Fair  Fund of Knowledge:  Good  Language:  Good  Akathisia:  No  Handed:  Right  AIMS (if indicated):     Assets:  Communication Skills Social Support  ADL's:  Intact  Cognition:  WNL  Sleep:  Number of Hours: 5.75   Assessment and Plan Mr. Callegari is a 39 yo patient who was admitted die to concerns for anxiety and agitation with IVC. Patient has a hx of schizophrenia,paranoid type, polysubstance use disorder, PTSD, and Bipolar 1 disorder.  Diagnosis   Substance induced mood disorder (HCC)  Hx   Bipolar I disorder, single manic episode, severe with psychotic features (HCC)   Mood disorder (HCC)   PTSD (post-traumatic stress disorder)   Schizophrenia, paranoid type (HCC)   Methamphetamine use disorder, mild, abuse (HCC)   Cocaine use disorder (HCC)   Polysubstance abuse (HCC)  Patient has  decreased agitation and aggression, but continues to have paranoia and required an additional 10mg  Zyprexa yesterday. Will adjust his medication regimen to address this paranoia. Patient PTSD also worsens around the time of his brothers death, which occurred around Christmas.  Scheduled - Attend groups as able - Nicotine patch 21mg  -Additional 5mg  Zyprexa once today to start 10mg  daily - Start Zyprexa 10mg  QHS  - Depakote ER 500mg  -Melatonin 10mg  QHS  PRN - trazodone 50mg  QHS -nicotine gum 2mg  -Milk of Mag 22ml -Maalox 88mL q 4 - Zyprexa 10mg  - klonopin 1mg  BID  - Ativan 1mg  CIWA >10  Treatment Plan Summary: Daily contact with patient to assess and evaluate symptoms and progress in treatment  , MD 02/23/2020, 8:16 AM

## 2020-02-23 NOTE — Progress Notes (Signed)
Pt talking on phone with his mother. Overheard by tech saying that if he was found dead in the morning someone else did it. Pt came to this writer after phone call, "I want you to know that I don't feel safe here. I am not suicidal and I would never take my life but someone else may." Pt told about the 15 minute safety checks. "Can you check on me? I don't want anyone else to do it. " Pt told that this writer would check on him from time to time but that there were other pts to take care of. Pt continues to pace the halls.

## 2020-02-24 DIAGNOSIS — F1994 Other psychoactive substance use, unspecified with psychoactive substance-induced mood disorder: Secondary | ICD-10-CM | POA: Diagnosis not present

## 2020-02-24 MED ORDER — TRAZODONE HCL 50 MG PO TABS: 50.0000 mg | ORAL_TABLET | Freq: Every evening | ORAL | 0 refills | Status: DC | PRN

## 2020-02-24 MED ORDER — NICOTINE 21 MG/24HR TD PT24: 21.0000 mg | MEDICATED_PATCH | Freq: Every day | TRANSDERMAL | 0 refills | Status: DC

## 2020-02-24 MED ORDER — OLANZAPINE 10 MG PO TABS: 10.0000 mg | ORAL_TABLET | Freq: Two times a day (BID) | ORAL | 0 refills | Status: DC

## 2020-02-24 MED ORDER — DIVALPROEX SODIUM ER 500 MG PO TB24: 500.0000 mg | ORAL_TABLET | Freq: Every day | ORAL | 0 refills | Status: DC

## 2020-02-24 NOTE — Discharge Summary (Signed)
Physician Discharge Summary Note  Patient:  Ronald [name] is an 39 y.o., male MRN:  [id number] DOB:  [birth year] Patient phone:  [phone] (home)  Patient address:   [id number] [name] [address],  Total Time spent with patient: 15 minutes  Date of Admission:  02/21/2020 Date of Discharge: 02/24/2020  Reason for Admission:  Substance induced psychosis  Principal Problem: Substance induced mood disorder (HCC) Discharge Diagnoses: Principal Problem:   Substance induced mood disorder (HCC) Active Problems:   Bipolar I disorder, single manic episode, severe with psychotic features (HCC)   Mood disorder (HCC)   PTSD (post-traumatic stress disorder)   Schizophrenia, paranoid type (HCC)   Methamphetamine use disorder, mild, abuse (HCC)   Cocaine use disorder (HCC)   Polysubstance abuse (HCC)   Past Psychiatric History: Patient has had multiple psychiatric hospitalizations he guesses at least 4. Per EMR he has been hospitalized at [facility] and [facility] in [name] . Patient has diagnosis of Bipolar 1 disorder Schizophrenia,Paranoid type, polysubstance abuse, and PTSD. Patient previous medications include: Abilify 15mg , Klonopin 0.5 BID, Benztropin 1mg  BID, valproate 500, depakote 500mg  daily, gabapentin 300 TID, Zyprexa 5-10mg , Melatonin 10mg .  Past Medical History:  Past Medical History:  Diagnosis Date  . Anxiety   . Arthritis   . Disorder of pineal gland   . Post traumatic stress disorder     Past Surgical History:  Procedure Laterality Date  . ANKLE SURGERY     Family History: History reviewed. No pertinent family history. Family Psychiatric  History: Unknown Social History:  Social History   Substance and Sexual Activity  Alcohol Use No     Social History   Substance and Sexual Activity  Drug Use Yes  . Types: Amphetamines, Benzodiazepines, Cocaine, Marijuana, Methamphetamines   Comment: one year ago    Social History   Socioeconomic History  . Marital  status: Single    Spouse name: Not on file  . Number of children: Not on file  . Years of education: Not on file  . Highest education level: Not on file  Occupational History  . Not on file  Tobacco Use  . Smoking status: Never Smoker  . Smokeless tobacco: Current User    Types: Chew  Vaping Use  . Vaping Use: Never used  Substance and Sexual Activity  . Alcohol use: No  . Drug use: Yes    Types: Amphetamines, Benzodiazepines, Cocaine, Marijuana, Methamphetamines    Comment: one year ago  . Sexual activity: Never  Other Topics Concern  . Not on file  Social History Narrative  . Not on file   Social Determinants of Health   Financial Resource Strain:   . Difficulty of Paying Living Expenses: Not on file  Food Insecurity:   . Worried About in the Last Year: Not on file  . Ran Out of Food in the Last Year: Not on file  Transportation Needs:   . Lack of Transportation (Medical): Not on file  . Lack of Transportation (Non-Medical): Not on file  Physical Activity:   . Days of Exercise per Week: Not on file  . Minutes of Exercise per Session: Not on file  Stress:   . Feeling of Stress : Not on file  Social Connections:   . Frequency of Communication with Friends and Family: Not on file  . Frequency of Social Gatherings with Friends and Family: Not on file  . Attends Religious Services: Not on file  . Active Member  of Clubs or Organizations: Not on file  . Attends Banker Meetings: Not on file  . Marital Status: Not on file    Hospital Course:  Patient presented from [facility] for concern for psychosis with a history of schizophrenia, paranoid type. Patient UDS was found to be positive for cocaine, MJ, benzos, amphetamines in the ED. Patient was also no longer taking any of his psychiatric medications per his mother. Patient was started on Zyprexa 5mg  daily and this was increased to  5mg  in the AM 10mg  QHS as patient was noted to have PRN need for  Zyprexa due to continued paranoia. Patient continued to display some mild paranoia and Zyprexa was adjusted to 10mg  BID. Patient did well on this dosage. Patient did not endorse SI, HI, nor AVH and was contracting for safety. Patient also reported that his paranoia towards others had decreased and he felt safe. Patient home depakote ER dose of 500mg  daily was continued throughout hospitalization. Patient was also provided nicotine patch.  Physical Findings: AIMS:  , ,  ,  ,    CIWA:  CIWA-Ar Total: 0 COWS:  COWS Total Score: 6  Musculoskeletal: Strength & Muscle Tone: within normal limits Gait & Station: normal Patient leans: N/A  Psychiatric Specialty Exam: Physical Exam HENT:     Head: Normocephalic and atraumatic.  Pulmonary:     Effort: Pulmonary effort is normal.  Neurological:     Mental Status: He is alert.     Review of Systems  Cardiovascular: Negative for chest pain.  Gastrointestinal: Negative for abdominal pain.  Neurological: Negative for headaches.    Blood pressure 136/77, pulse 79, temperature 97.7 F (36.5 C), temperature source Oral, resp. rate 18, height 5\' 11"  (1.803 m), weight 77.6 kg, SpO2 97 %.Body mass index is 23.85 kg/m.  General Appearance: Fairly Groomed  Eye Contact:  Fair  Speech:  Clear and Coherent  Volume:  Normal  Mood:  Euthymic  Affect:  Appropriate  Thought Process:  Goal Directed  Orientation:  NA  Thought Content:  Logical  Suicidal Thoughts:  No  Homicidal Thoughts:  No  Memory:  NA  Judgement:  Fair  Insight:  Fair  Psychomotor Activity:  Normal  Concentration:  Concentration: Good and Attention Span: Good  Recall:  NA  Fund of Knowledge:  Good  Language:  Good  Akathisia:  No  Handed:  Ambidextrous  AIMS (if indicated):     Assets:  Communication Skills Desire for Improvement Housing Social Support  ADL's:  Intact  Cognition:  WNL  Sleep:  Number of Hours: 7.5     Have you used any form of tobacco in the last 30  days? (Cigarettes, Smokeless Tobacco, Cigars, and/or Pipes): Yes  Has this patient used any form of tobacco in the last 30 days? (Cigarettes, Smokeless Tobacco, Cigars, and/or Pipes) Yes, Yes, A prescription for an FDA-approved tobacco cessation medication was offered at discharge and the patient refused  Blood Alcohol level:  Lab Results  Component Value Date   [facility] <10 02/19/2020   ETH <10 02/19/2020    Metabolic Disorder Labs:  Lab Results  Component Value Date   HGBA1C 5.0 02/19/2020   MPG 96.8 02/19/2020   No results found for: PROLACTIN Lab Results  Component Value Date   CHOL 131 02/19/2020   TRIG 76 02/19/2020   HDL 61 02/19/2020   CHOLHDL 2.1 02/19/2020   VLDL 15 02/19/2020   LDLCALC 55 02/19/2020    See Psychiatric Specialty Exam  and Suicide Risk Assessment completed by Attending Physician prior to discharge.  Discharge destination:  Home  Is patient on multiple antipsychotic therapies at discharge:  No   Has Patient had three or more failed trials of antipsychotic monotherapy by history:  No  Recommended Plan for Multiple Antipsychotic Therapies: NA  Discharge Instructions    Diet - low sodium heart healthy   Complete by: As directed    Increase activity slowly   Complete by: As directed      Allergies as of 02/24/2020      Reactions   Hydroxyzine Anxiety   Other reaction(s): Other "hyper "   Benadryl [diphenhydramine]    itching   Other    All antihistamines   Penicillins    Mother states he's not allergic to this      Medication List    TAKE these medications     Indication  clonazePAM 1 MG tablet Commonly known as: KLONOPIN Take 1 tablet (1 mg total) by mouth 2 (two) times daily.  Indication: Feeling Anxious   divalproex 500 MG 24 hr tablet Commonly known as: DEPAKOTE ER Take 1 tablet (500 mg total) by mouth daily.  Indication: Manic Phase of Manic-Depression   Melatonin 10 MG Caps Take 10 mg by mouth at bedtime.  Indication:  sleep   nicotine 21 mg/24hr patch Commonly known as: NICODERM CQ - dosed in mg/24 hours Place 1 patch (21 mg total) onto the skin daily. Start taking on: February 25, 2020  Indication: Nicotine Addiction   OLANZapine 10 MG tablet Commonly known as: ZYPREXA Take 1 tablet (10 mg total) by mouth in the morning and at bedtime.  Indication: MIXED BIPOLAR AFFECTIVE DISORDER, Schizophrenia   traZODone 50 MG tablet Commonly known as: DESYREL Take 1 tablet (50 mg total) by mouth at bedtime as needed for sleep.  Indication: Trouble Sleeping        Follow-up recommendations:  Follow up recommendations: - Activity as tolerated. - Diet as recommended by PCP. - Keep all scheduled follow-up appointments as recommended.   Comments:  Patient is instructed to take all prescribed medications as recommended. Report any side effects or adverse reactions to your outpatient psychiatrist. Patient is instructed to abstain from alcohol and illegal drugs while on prescription medications. In the event of worsening symptoms, patient is instructed to call the crisis hotline, 911, or go to the nearest emergency department for evaluation and treatment.    Signed: Bobbye Morton, MD 02/24/2020, 4:43 PM

## 2020-02-24 NOTE — BHH Group Notes (Signed)
The focus of this group is to help patients establish daily goals to achieve during treatment and discuss how the patient can incorporate goal setting into their daily lives to aide in recovery.  Pt did not attend group 

## 2020-02-24 NOTE — Progress Notes (Signed)
  [facility] Adult Case Management Discharge Plan :  Will you be returning to the same living situation after discharge:  No. Will be staying with mother At discharge, do you have transportation home?: Yes,  mother to pick this patient up Do you have the ability to pay for your medications: No. Samples to be provided at discharge  Release of information consent forms completed and in the chart;  Patient's signature needed at discharge.  Patient to Follow up at:   Next level of care provider has access to [facility] Link:no  Safety Planning and Suicide Prevention discussed: Yes,  with patient  Have you used any form of tobacco in the last 30 days? (Cigarettes, Smokeless Tobacco, Cigars, and/or Pipes): Yes  Has patient been referred to the Quitline?: Patient refused referral  Patient has been referred for addiction treatment: Pt. refused referral  Otelia Santee, LCSW 02/24/2020, 9:46 AM

## 2020-02-24 NOTE — BHH Suicide Risk Assessment (Signed)
[facility] Discharge Suicide Risk Assessment   Principal Problem: Substance induced mood disorder (HCC) Discharge Diagnoses: Principal Problem:   Substance induced mood disorder (HCC) Active Problems:   Bipolar I disorder, single manic episode, severe with psychotic features (HCC)   Mood disorder (HCC)   PTSD (post-traumatic stress disorder)   Schizophrenia, paranoid type (HCC)   Methamphetamine use disorder, mild, abuse (HCC)   Cocaine use disorder (HCC)   Polysubstance abuse (HCC)   Total Time spent with patient: 20 minutes  Musculoskeletal: Strength & Muscle Tone: within normal limits Gait & Station: normal Patient leans: N/A  Psychiatric Specialty Exam: Review of Systems  Blood pressure 136/77, pulse 79, temperature 97.7 F (36.5 C), temperature source Oral, resp. rate 18, height 5\' 11"  (1.803 m), weight 77.6 kg, SpO2 97 %.Body mass index is 23.85 kg/m.  General Appearance: Casual and Fairly Groomed  Eye Contact::  Good  Speech:  Clear and Coherent and Normal Rate409  Volume:  Normal  Mood:  "good"  Affect:  Congruent and eythymic, appropriately reactive  Thought Process:  Coherent, Goal Directed and Linear  Orientation:  Full (Time, Place, and Person)  Thought Content:  WDL  Suicidal Thoughts:  No  Homicidal Thoughts:  No  Memory:  Immediate;   Good Recent;   Good  Judgement:  Good  Insight:  Good  Psychomotor Activity:  Normal  Concentration:  Good  Recall:  Good  Fund of Knowledge:Good  Language: Good  Akathisia:  No  Handed:  Ambidextrous  AIMS (if indicated):     Assets:  Communication Skills Desire for Improvement Physical Health Resilience Social Support  Sleep:  Number of Hours: 7.5  Cognition: WNL  ADL's:  Intact   Mental Status Per Nursing Assessment::   On Admission:  NA  Demographic Factors:  Male  Loss Factors: NA  Historical Factors: Impulsivity  Risk Reduction Factors:   Living with another person, especially a relative, Positive  social support and Positive coping skills or problem solving skills  Continued Clinical Symptoms:  Alcohol/Substance Abuse/Dependencies Previous Psychiatric Diagnoses and Treatments  Cognitive Features That Contribute To Risk:  None    Suicide Risk:  Minimal: No identifiable suicidal ideation.  Patients presenting with no risk factors but with morbid ruminations; may be classified as minimal risk based on the severity of the depressive symptoms    Plan Of Care/Follow-up recommendations:  Activity:  as tolerated Diet:  regular Other:      Patient is instructed prior to discharge to: Take all medications as prescribed by his/her mental healthcare provider. Report any adverse effects and or reactions from the medicines to his/her outpatient provider promptly. Patient has been instructed & cautioned: To not engage in alcohol and or illegal drug use while on prescription medicines. In the event of worsening symptoms, patient is instructed to call the crisis hotline, 911 and or go to the nearest ED for appropriate evaluation and treatment of symptoms. To follow-up with his/her primary care provider for your other medical issues, concerns and or health care needs.     [phone], MD 02/24/2020, 10:55 AM

## 2020-02-24 NOTE — Progress Notes (Signed)
Recreation Therapy Notes  Date: 11.11.21 Time: 1000 Location: 500 Hall Dayroom  Group Topic: Coping Skills  Goal Area(s) Addresses:  Patient will identify positive coping skills. Patient will identify benefits of using positive coping skills.  Behavioral Response:  Engaged  Intervention: Worksheet  Activity: Orthoptist.  Patients were to identify the things they felt have kept them from progressing and write them inside the web.  Patients were to then identify positive coping skills that could help them break free from the things holding them back.   Education: Pharmacologist, Building control surveyor.   Education Outcome: Acknowledges understanding/In group clarification offered/Needs additional education.   Clinical Observations/Feedback: Pt was quiet but shared he deals with PTSD and panic attacks.  Pt expressed using meditation and support groups as coping skills.   Caroll Rancher, LRT/CTRS         Caroll Rancher A 02/24/2020 10:58 AM

## 2020-02-24 NOTE — Progress Notes (Signed)
Discharge Note:  Patient denies SI/HI AVH at this time. Discharge instructions, AVS, prescriptions and transition record gone over with patient. Patient agrees to comply with medication management, follow-up visit, and outpatient therapy. Patient belongings returned to patient. Patient questions and concerns addressed and answered.  Patient ambulatory off unit.  Patient discharged to home with friend.   

## 2020-04-04 ENCOUNTER — Other Ambulatory Visit: Payer: Self-pay | Admitting: Registered Nurse

## 2020-04-04 DIAGNOSIS — F39 Unspecified mood [affective] disorder: Secondary | ICD-10-CM

## 2020-04-04 NOTE — Telephone Encounter (Signed)
Requested medication (s) are due for refill today: yes  Requested medication (s) are on the active medication list: yes  Last refill:  03/02/2020  Future visit scheduled: no  Notes to clinic:  this refill cannot be delegated    Requested Prescriptions  Pending Prescriptions Disp Refills   clonazePAM (KLONOPIN) 1 MG tablet [Pharmacy Med Name: CLONAZEPAM 1 MG TABLET] 60 tablet 0    Sig: TAKE 1 TABLET BY MOUTH 2 TIMES DAILY AS NEEDED.      Not Delegated - Psychiatry:  Anxiolytics/Hypnotics Failed - 04/04/2020  9:02 AM      Failed - This refill cannot be delegated      Passed - Urine Drug Screen completed in last 360 days      Passed - Valid encounter within last 6 months    Recent Outpatient Visits           1 month ago Mood disorder [facility])   [facility], Gerlene Burdock, NP   1 month ago Pain of soft tissue of extremity   Primary Care at [facility], Eilleen Kempf, MD   1 year ago PTSD (post-traumatic stress disorder)   Primary Care at [facility], Eilleen Kempf, MD   1 year ago Generalized anxiety disorder   Primary Care at [place], Eilleen Kempf, MD   3 years ago Mood disorder [facility])   [facility], Levell July, MD

## 2020-04-04 NOTE — Telephone Encounter (Signed)
Patient is requesting a refill of the following medications: Requested Prescriptions   Pending Prescriptions Disp Refills   clonazePAM (KLONOPIN) 1 MG tablet [Pharmacy Med Name: CLONAZEPAM 1 MG TABLET] 60 tablet 0    Sig: TAKE 1 TABLET BY MOUTH 2 TIMES DAILY AS NEEDED.   Date of patient request: 04/04/20 Last office visit: 02/18/20 Date of last refill: 02/19/20 Last refill amount: 30 Follow up time period per chart:

## 2020-04-10 ENCOUNTER — Other Ambulatory Visit: Payer: Self-pay | Admitting: Emergency Medicine

## 2020-04-10 MED ORDER — CLONAZEPAM 1 MG PO TABS: 1.0000 mg | ORAL_TABLET | Freq: Two times a day (BID) | ORAL | 0 refills | Status: DC

## 2020-04-10 NOTE — Telephone Encounter (Signed)
Patient needs refill for  clonazePAM Ronald [name]) 1 MG tablet  Pharmacy:  [id number] - [address]  [address] [place], [name] [address]  Phone:  [phone] Fax:  [phone]  DEA #:  [id number]  Patient took last pill this morning. Please advise at [phone]

## 2020-05-11 ENCOUNTER — Other Ambulatory Visit: Payer: Self-pay | Admitting: Registered Nurse

## 2020-05-11 DIAGNOSIS — F39 Unspecified mood [affective] disorder: Secondary | ICD-10-CM

## 2020-05-11 NOTE — Telephone Encounter (Signed)
Patient is requesting a refill of the following medications: Requested Prescriptions   Pending Prescriptions Disp Refills   clonazePAM (KLONOPIN) 1 MG tablet [Pharmacy Med Name: CLONAZEPAM 1 MG TABLET] 60 tablet 0    Sig: TAKE 1 TABLET BY MOUTH TWICE A DAY AS NEEDED    Date of patient request: 05/11/20 Last office visit: 02/18/20 Date of last refill: 04/10/20 Last refill amount: 60 and 30 Follow up time period per chart:

## 2020-05-11 NOTE — Telephone Encounter (Signed)
Requested medication (s) are due for refill today: yes  Requested medication (s) are on the active medication list: yes  Last refill:  04/10/20 #60  Future visit scheduled: no  Notes to clinic:  Please review for refill. Refill not delegated per protocol    Requested Prescriptions  Pending Prescriptions Disp Refills   clonazePAM (KLONOPIN) 1 MG tablet [Pharmacy Med Name: CLONAZEPAM 1 MG TABLET] 60 tablet 0    Sig: TAKE 1 TABLET BY MOUTH TWICE A DAY AS NEEDED      Not Delegated - Psychiatry:  Anxiolytics/Hypnotics Failed - 05/11/2020  8:30 AM      Failed - This refill cannot be delegated      Passed - Urine Drug Screen completed in last 360 days      Passed - Valid encounter within last 6 months    Recent Outpatient Visits           2 months ago Mood disorder [facility])   [facility], Gerlene Burdock, NP   3 months ago Pain of soft tissue of extremity   Primary Care at [facility], Eilleen Kempf, MD   1 year ago PTSD (post-traumatic stress disorder)   Primary Care at [facility], Eilleen Kempf, MD   1 year ago Generalized anxiety disorder   Primary Care at [place], Eilleen Kempf, MD   3 years ago Mood disorder [facility])   [facility] at [facility], Levell July, MD

## 2020-06-14 ENCOUNTER — Other Ambulatory Visit: Payer: Self-pay | Admitting: Registered Nurse

## 2020-06-14 DIAGNOSIS — F39 Unspecified mood [affective] disorder: Secondary | ICD-10-CM

## 2020-06-14 NOTE — Telephone Encounter (Signed)
Requested medication (s) are due for refill today: yes  Requested medication (s) are on the active medication list: yes  Last refill:  05/15/2020  Future visit scheduled:no  Notes to clinic: this refill cannot be delegated    Requested Prescriptions  Pending Prescriptions Disp Refills   clonazePAM (KLONOPIN) 1 MG tablet [Pharmacy Med Name: CLONAZEPAM 1 MG TABLET] 60 tablet 0    Sig: TAKE 1 TABLET BY MOUTH TWICE A DAY AS NEEDED      Not Delegated - Psychiatry:  Anxiolytics/Hypnotics Failed - 06/14/2020  9:03 AM      Failed - This refill cannot be delegated      Passed - Urine Drug Screen completed in last 360 days      Passed - Valid encounter within last 6 months    Recent Outpatient Visits           3 months ago Mood disorder [facility])   [facility], Richard, NP   4 months ago Pain of soft tissue of extremity   Primary Care at [facility], Eilleen Kempf, MD   1 year ago PTSD (post-traumatic stress disorder)   Primary Care at [facility], Eilleen Kempf, MD   1 year ago Generalized anxiety disorder   Primary Care at [place], Eilleen Kempf, MD   3 years ago Mood disorder [facility])   [facility] at [facility], Levell July, MD

## 2020-06-14 NOTE — Telephone Encounter (Signed)
Patient is requesting a refill of the following medications: Requested Prescriptions   Pending Prescriptions Disp Refills   clonazePAM (KLONOPIN) 1 MG tablet [Pharmacy Med Name: CLONAZEPAM 1 MG TABLET] 60 tablet 0    Sig: TAKE 1 TABLET BY MOUTH TWICE A DAY AS NEEDED    Date of patient request: 06/14/20 Last office visit: 02/18/20 Date of last refill: 05/15/20 Last refill amount: 60 Follow up time period per chart: none

## 2020-07-14 ENCOUNTER — Other Ambulatory Visit: Payer: Self-pay | Admitting: Registered Nurse

## 2020-07-14 DIAGNOSIS — F39 Unspecified mood [affective] disorder: Secondary | ICD-10-CM

## 2020-07-17 NOTE — Telephone Encounter (Signed)
Patient called back and wants this rx filled

## 2020-07-17 NOTE — Telephone Encounter (Signed)
Patient is requesting a refill of the following medications: Requested Prescriptions   Pending Prescriptions Disp Refills  . clonazePAM (KLONOPIN) 1 MG tablet [Pharmacy Med Name: CLONAZEPAM 1 MG TABLET] 60 tablet 0    Sig: TAKE 1 TABLET BY MOUTH TWICE A DAY AS NEEDED    Date of patient request: 07/14/2020 Last office visit:02/18/2020 Date of last refill: 04/10/2020 Last refill amount: 30 tablets  Follow up time period per chart: None

## 2020-08-19 ENCOUNTER — Other Ambulatory Visit: Payer: Self-pay | Admitting: Registered Nurse

## 2020-08-19 DIAGNOSIS — F39 Unspecified mood [affective] disorder: Secondary | ICD-10-CM

## 2020-08-24 ENCOUNTER — Telehealth: Payer: Self-pay | Admitting: Registered Nurse

## 2020-08-24 ENCOUNTER — Other Ambulatory Visit: Payer: Self-pay

## 2020-08-24 DIAGNOSIS — F39 Unspecified mood [affective] disorder: Secondary | ICD-10-CM

## 2020-08-24 MED ORDER — CLONAZEPAM 1 MG PO TABS: 1.0000 mg | ORAL_TABLET | Freq: Two times a day (BID) | ORAL | 0 refills | Status: DC

## 2020-08-24 NOTE — Telephone Encounter (Signed)
Please see phone note from today. 

## 2020-08-24 NOTE — Telephone Encounter (Signed)
Pt called in stating that he is out of the Clonazepam, pt has a TOC with Richard on 09/20/20, he states that Gerlene Burdock is his pcp but the system says that Alvy Bimler is pcp, please advise if we can send in the refill [facility] [address]   He states that he doesn't know who Alvy Bimler is and that he is not his pcp.  Pt is also leaving to go out of town until June 1st so he would like to pick this up today or tomorrow at the latest.

## 2020-08-24 NOTE — Telephone Encounter (Signed)
Request sent to NP Richard for approval.

## 2020-08-24 NOTE — Telephone Encounter (Signed)
  Pt called in stating that he is out of the Clonazepam, pt has a TOC with Ronald [name] on 09/20/20, he states that Ronald [name] is his pcp but the system says that Ronald [name] is pcp, please advise if we can send in the refill [facility] [address]   He states that he doesn't know who Ronald [name] is and that he is not his pcp.  Pt is also leaving to go out of town until June 1st so he would like to pick this up today or tomorrow at the latest.       LFD 07/19/20 #60 with no refill LOV 02/18/20 NOV 09/20/20 for TOC

## 2020-08-25 ENCOUNTER — Other Ambulatory Visit: Payer: Self-pay | Admitting: Registered Nurse

## 2020-08-25 DIAGNOSIS — F39 Unspecified mood [affective] disorder: Secondary | ICD-10-CM

## 2020-08-25 MED ORDER — CLONAZEPAM 1 MG PO TABS: 1.0000 mg | ORAL_TABLET | Freq: Two times a day (BID) | ORAL | 3 refills | Status: DC | PRN

## 2020-09-20 ENCOUNTER — Other Ambulatory Visit: Payer: Self-pay

## 2020-09-20 ENCOUNTER — Encounter: Payer: Self-pay | Admitting: Registered Nurse

## 2020-09-20 ENCOUNTER — Ambulatory Visit (INDEPENDENT_AMBULATORY_CARE_PROVIDER_SITE_OTHER): Payer: Self-pay | Admitting: Registered Nurse

## 2020-09-20 VITALS — BP 118/80 | HR 69 | Temp 98.4°F | Ht 71.0 in | Wt 173.2 lb

## 2020-09-20 DIAGNOSIS — Z13220 Encounter for screening for lipoid disorders: Secondary | ICD-10-CM

## 2020-09-20 DIAGNOSIS — H938X2 Other specified disorders of left ear: Secondary | ICD-10-CM

## 2020-09-20 DIAGNOSIS — Z789 Other specified health status: Secondary | ICD-10-CM

## 2020-09-20 DIAGNOSIS — F200 Paranoid schizophrenia: Secondary | ICD-10-CM

## 2020-09-20 DIAGNOSIS — Z7689 Persons encountering health services in other specified circumstances: Secondary | ICD-10-CM

## 2020-09-20 DIAGNOSIS — F4310 Post-traumatic stress disorder, unspecified: Secondary | ICD-10-CM

## 2020-09-20 DIAGNOSIS — Z130 Encounter for screening for diseases of the blood and blood-forming organs and certain disorders involving the immune mechanism: Secondary | ICD-10-CM

## 2020-09-20 DIAGNOSIS — Z13228 Encounter for screening for other metabolic disorders: Secondary | ICD-10-CM

## 2020-09-20 DIAGNOSIS — Z1329 Encounter for screening for other suspected endocrine disorder: Secondary | ICD-10-CM

## 2020-09-20 MED ORDER — HYDROCORTISONE-ACETIC ACID 1-2 % OT SOLN: 3.0000 [drp] | Freq: Three times a day (TID) | OTIC | 0 refills | Status: DC | PRN

## 2020-09-20 NOTE — Progress Notes (Signed)
Established Patient Office Visit  Subjective:  Patient ID: Ronald [name], male    DOB: [birth year]  Age: 40 y.o. MRN: [id number]  CC:  Chief Complaint  Patient presents with  . Transitions Of Care  . Ear Fullness    Left ear    HPI [name] presents for visit to est care.  Histories reviewed and updated with patient.   Anxiety, PTSD: Managed with Dr. Bronwen Betters in psychiatry. On Zyprexa 10mg  PO bid. Does use clonazepam 1mg  PO bid PRN for breakthrough anxiety. Formerly on trazodone 50mg  PO qhs but no longer taking this - did not need it.  Today, c/o ear fullness r side only No drainage No pain No recent URI No foreign body  Otherwise feeling well and without further concern.  Past Medical History:  Diagnosis Date  . Anxiety   . Arthritis   . Disorder of pineal gland   . Post traumatic stress disorder     Past Surgical History:  Procedure Laterality Date  . ANKLE SURGERY      History reviewed. No pertinent family history.  Social History   Socioeconomic History  . Marital status: Single    Spouse name: Not on file  . Number of children: Not on file  . Years of education: Not on file  . Highest education level: Not on file  Occupational History  . Not on file  Tobacco Use  . Smoking status: Never Smoker  . Smokeless tobacco: Current User    Types: Chew  Vaping Use  . Vaping Use: Never used  Substance and Sexual Activity  . Alcohol use: No  . Drug use: Yes    Types: Amphetamines, Benzodiazepines, Cocaine, Marijuana, Methamphetamines    Comment: one year ago  . Sexual activity: Never  Other Topics Concern  . Not on file  Social History Narrative  . Not on file   Social Determinants of Health   Financial Resource Strain: Not on file  Food Insecurity: Not on file  Transportation Needs: Not on file  Physical Activity: Not on file  Stress: Not on file  Social Connections: Not on file  Intimate Partner Violence: Not on file    Outpatient  Medications Prior to Visit  Medication Sig Dispense Refill  . clonazePAM (KLONOPIN) 1 MG tablet Take 1 tablet (1 mg total) by mouth 2 (two) times daily. 30 tablet 0  . Melatonin 10 MG CAPS Take 10 mg by mouth at bedtime.    OLANZapine (ZYPREXA) 10 MG tablet Take 1 tablet (10 mg total) by mouth in the morning and at bedtime. 60 tablet 0  . divalproex (DEPAKOTE ER) 500 MG 24 hr tablet Take 1 tablet (500 mg total) by mouth daily. 30 tablet 0  . traZODone (DESYREL) 50 MG tablet Take 1 tablet (50 mg total) by mouth at bedtime as needed for sleep. (Patient not taking: Reported on 09/20/2020) 30 tablet 0  . clonazePAM (KLONOPIN) 1 MG tablet Take 1 tablet (1 mg total) by mouth 2 (two) times daily as needed. 60 tablet 3  . nicotine (NICODERM CQ - DOSED IN MG/24 HOURS) 21 mg/24hr patch Place 1 patch (21 mg total) onto the skin daily. 28 patch 0   No facility-administered medications prior to visit.    Allergies  Allergen Reactions  . Hydroxyzine Anxiety    Other reaction(s): Other "hyper "  . Benadryl [Diphenhydramine]     itching  . Other     All antihistamines  . Penicillins  Mother states he's not allergic to this    ROS Review of Systems  Constitutional: Negative.   HENT: Negative.   Eyes: Negative.   Respiratory: Negative.   Cardiovascular: Negative.   Gastrointestinal: Negative.   Genitourinary: Negative.   Musculoskeletal: Negative.   Skin: Negative.   Neurological: Negative.   Psychiatric/Behavioral: Negative.   All other systems reviewed and are negative.     Objective:    Physical Exam Constitutional:      General: He is not in acute distress.    Appearance: Normal appearance. He is normal weight. He is not ill-appearing, toxic-appearing or diaphoretic.  HENT:     Right Ear: Hearing, tympanic membrane, ear canal and external ear normal.     Left Ear: Hearing, tympanic membrane and external ear normal. Laceration (mild lacerations in canal, irritation) present.   Cardiovascular:     Rate and Rhythm: Normal rate and regular rhythm.     Heart sounds: Normal heart sounds. No murmur heard. No friction rub. No gallop.   Pulmonary:     Effort: Pulmonary effort is normal. No respiratory distress.     Breath sounds: Normal breath sounds. No stridor. No wheezing, rhonchi or rales.  Chest:     Chest wall: No tenderness.  Neurological:     General: No focal deficit present.     Mental Status: He is alert and oriented to person, place, and time. Mental status is at baseline.  Psychiatric:        Mood and Affect: Mood normal.        Behavior: Behavior normal.        Thought Content: Thought content normal.        Judgment: Judgment normal.     BP 118/80   Pulse 69   Temp 98.4 F (36.9 C)   Ht 5\' 11"  (1.803 m)   Wt 173 lb 4 oz (78.6 kg)   SpO2 96%   BMI 24.16 kg/m  Wt Readings from Last 3 Encounters:  09/20/20 173 lb 4 oz (78.6 kg)  02/19/20 171 lb (77.6 kg)  02/18/20 170 lb 12.8 oz (77.5 kg)     There are no preventive care reminders to display for this patient.  There are no preventive care reminders to display for this patient.  Lab Results  Component Value Date   TSH 1.898 02/19/2020   Lab Results  Component Value Date   WBC 10.8 (H) 02/19/2020   HGB 15.7 02/19/2020   HCT 45.0 02/19/2020   MCV 88.8 02/19/2020   PLT 240 02/19/2020   Lab Results  Component Value Date   NA 139 02/19/2020   K 3.7 02/19/2020   CO2 25 02/19/2020   GLUCOSE 111 (H) 02/19/2020   BUN 15 02/19/2020   CREATININE 1.18 02/19/2020   BILITOT 1.9 (H) 02/19/2020   ALKPHOS 54 02/19/2020   AST 111 (H) 02/19/2020   ALT 52 (H) 02/19/2020   PROT 7.6 02/19/2020   ALBUMIN 4.7 02/19/2020   CALCIUM 9.8 02/19/2020   ANIONGAP 13 02/19/2020   Lab Results  Component Value Date   CHOL 131 02/19/2020   Lab Results  Component Value Date   HDL 61 02/19/2020   Lab Results  Component Value Date   LDLCALC 55 02/19/2020   Lab Results  Component Value Date    TRIG 76 02/19/2020   Lab Results  Component Value Date   CHOLHDL 2.1 02/19/2020   Lab Results  Component Value Date   HGBA1C 5.0 02/19/2020  Assessment & Plan:   Problem List Items Addressed This Visit      Other   PTSD (post-traumatic stress disorder)   Schizophrenia, paranoid type (HCC)    Other Visit Diagnoses    Encounter to establish care    -  Primary   Irritation of left ear       Relevant Medications   acetic acid-hydrocortisone (VOSOL-HC) OTIC solution   Takes dietary supplements       Relevant Orders   B12 and Folate Panel   Vitamin D (25 hydroxy)   Screening for endocrine, metabolic and immunity disorder       Relevant Orders   CBC with Differential/Platelet   Comprehensive metabolic panel   Hemoglobin A1c   TSH   Lipid screening       Relevant Orders   Lipid panel      Meds ordered this encounter  Medications  . acetic acid-hydrocortisone (VOSOL-HC) OTIC solution    Sig: Place 3 drops into both ears 3 (three) times daily as needed.    Dispense:  10 mL    Refill:  0    Order Specific Question:   Supervising Provider    Answer:   Neva Seat, JEFFREY R [[id number]]    Follow-up: No follow-ups on file.   PLAN  Ear appears with small lacerations and irritation from at home disimpaction of cerumen. Will give vosol-hc. Return if worsening or failing to improve. Consider ENT referral   Otherwise no acute concerns. Continue on current meds. Continue to follow with Dr. Bronwen Betters.   Return in 6 mo for med check  Labs collected. Will follow up with the patient as warranted.  Patient encouraged to call clinic with any questions, comments, or concerns.  [name], NP

## 2020-09-20 NOTE — Patient Instructions (Signed)
Mr. Reister-   Always a pleasure.   Labs today should be back by tomorrow or Friday. I'll call with any acute concerns.  I have sent the ear drops to your pharmacy. They should help that ear feel better within a few days.  See you in 6 mo, let me know if you need anything in the interim.  Thank you  Rich

## 2020-10-23 ENCOUNTER — Other Ambulatory Visit: Payer: Self-pay

## 2020-10-23 ENCOUNTER — Encounter: Payer: Self-pay | Admitting: Registered Nurse

## 2020-10-23 ENCOUNTER — Ambulatory Visit (INDEPENDENT_AMBULATORY_CARE_PROVIDER_SITE_OTHER): Payer: Self-pay | Admitting: Registered Nurse

## 2020-10-23 VITALS — BP 132/93 | HR 99 | Temp 98.3°F | Resp 18 | Ht 71.0 in | Wt 167.0 lb

## 2020-10-23 DIAGNOSIS — F331 Major depressive disorder, recurrent, moderate: Secondary | ICD-10-CM

## 2020-10-23 IMAGING — DX DG ABDOMEN ACUTE W/ 1V CHEST
3 series · 3 of 3 positions shown · non-contrast
Comparison: None.

CLINICAL DATA: Status post fall with pain.

EXAM:
DG ABDOMEN ACUTE W/ 1V CHEST

[chest pa]
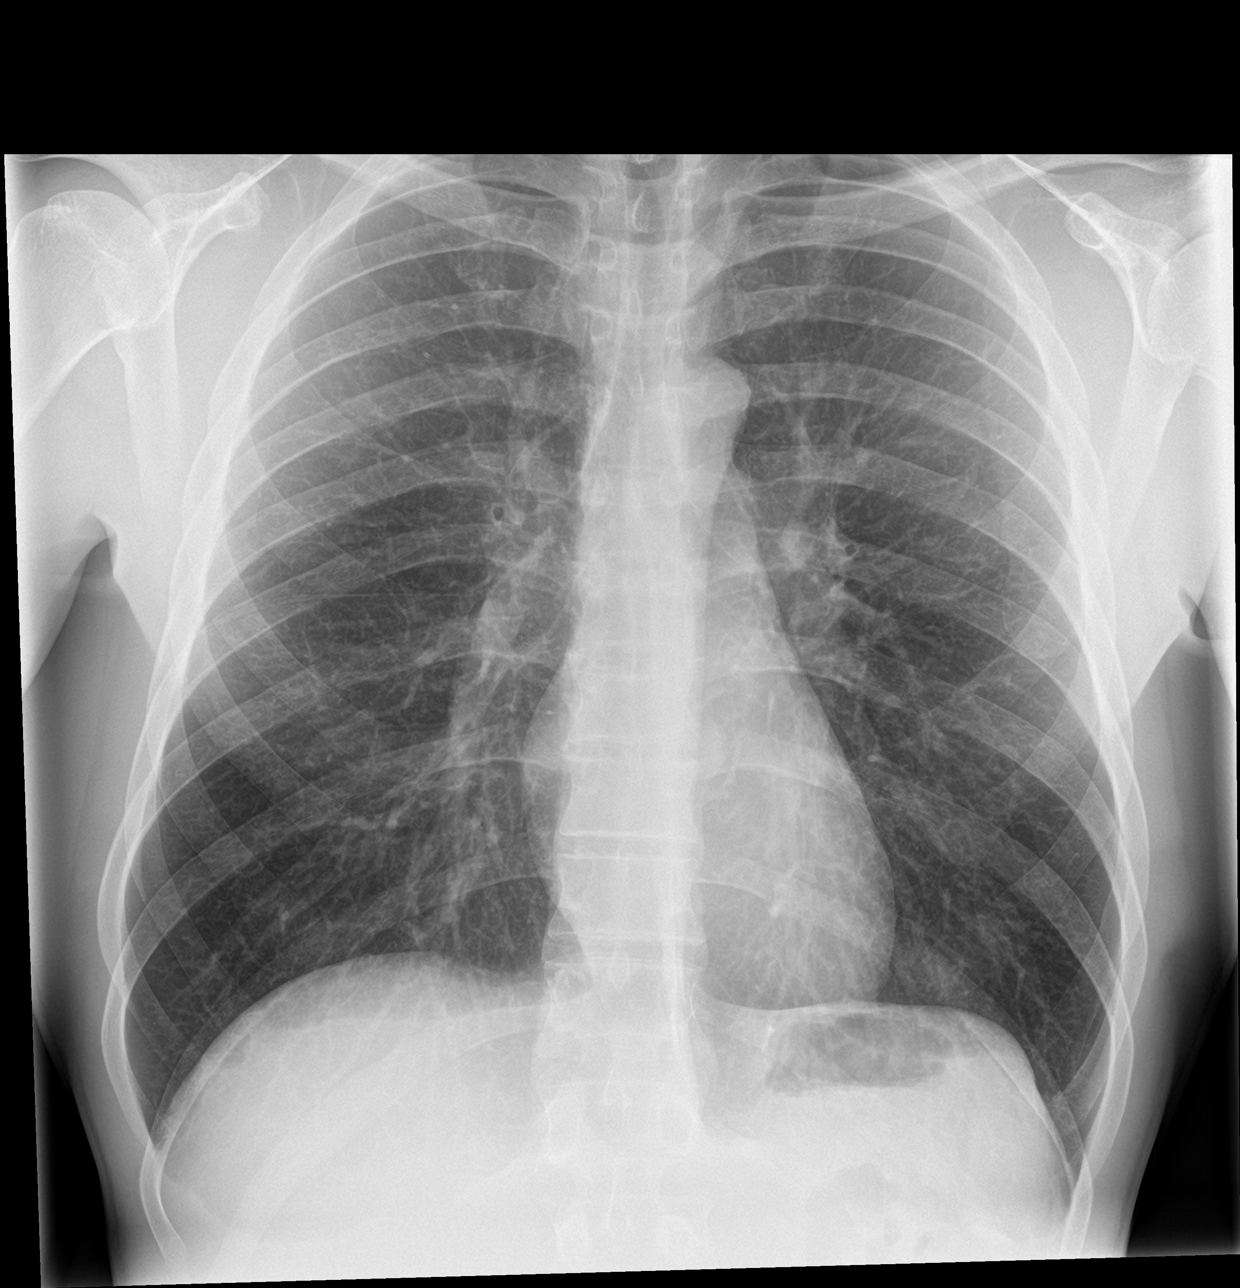

[abdomen erect]
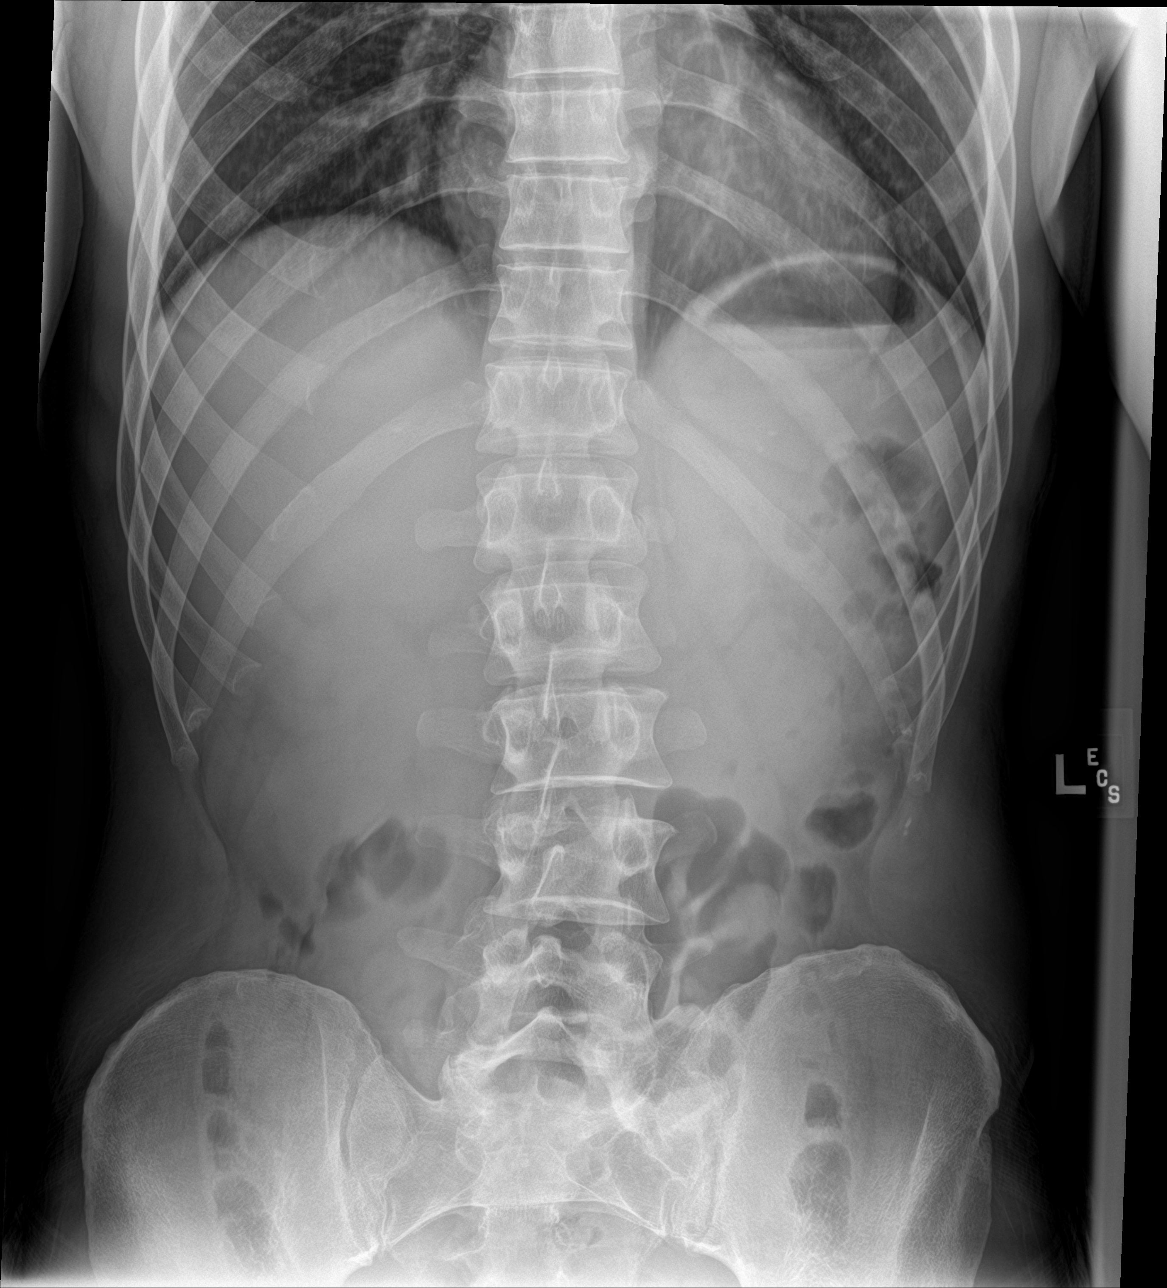

[abdomen supine]
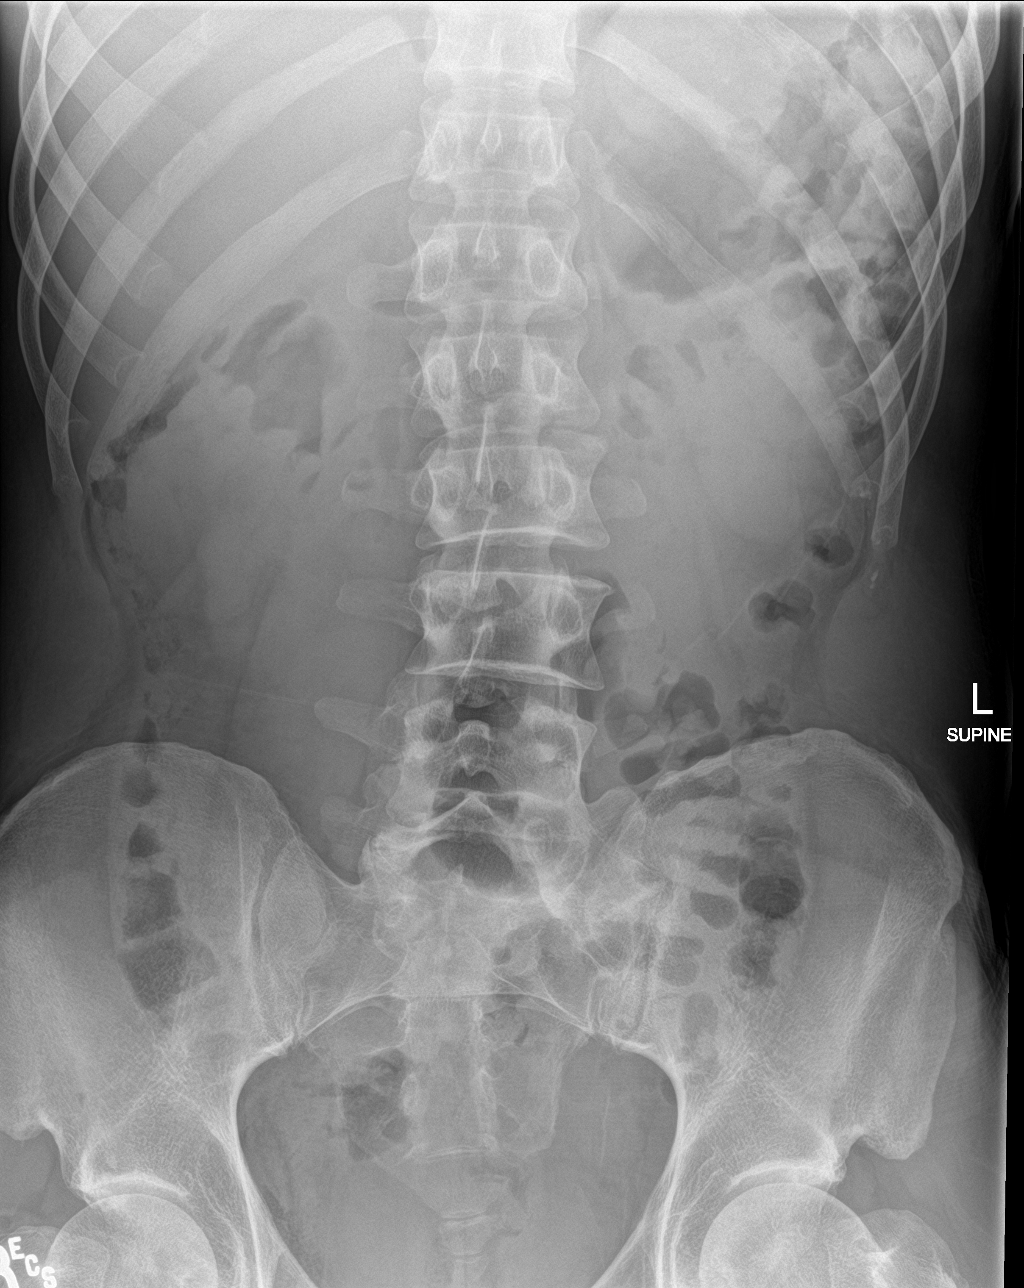

[3 of 3 positions shown; findings below may reference images not displayed]

FINDINGS: There is no evidence of dilated bowel loops or free intraperitoneal
air. No radiopaque calculi or other significant radiographic
abnormality is seen. Heart size and mediastinal contours are within
normal limits. Both lungs are clear.
IMPRESSION: Negative abdominal radiographs.  No acute cardiopulmonary disease.

## 2020-10-23 MED ORDER — CLONAZEPAM 1 MG PO TABS: 1.0000 mg | ORAL_TABLET | Freq: Two times a day (BID) | ORAL | 0 refills | Status: DC

## 2020-10-23 MED ORDER — BUPROPION HCL ER (SR) 150 MG PO TB12: 150.0000 mg | ORAL_TABLET | Freq: Two times a day (BID) | ORAL | 1 refills | Status: DC

## 2020-10-23 NOTE — Patient Instructions (Addendum)
Mr. Mccarey -   Randie Heinz to see you - sorry to hear about your loss.  Let's see if Wellbutrin (bupropion) helps. Start at a dose of 150mg  daily. Can increase to twice daily after two weeks if it's working well.  Definitely encouraged to continue the clonazepam as needed.   See you in 6 weeks to check to see how this is working. Please don't hesitate to come in sooner if you have concerns.  Thank you  Rich    If you have lab work done today you will be contacted with your lab results within the next 2 weeks.  If you have not heard from then please contact us. The fastest way to get your results is to register for My Chart.   IF you received an x-ray today, you will receive an invoice from [facility]. Please contact [facility] at [phone] with questions or concerns regarding your invoice.   IF you received labwork today, you will receive an invoice from [facility]. Please contact [facility] at [phone] with questions or concerns regarding your invoice.   Our billing staff will not be able to assist you with questions regarding bills from these companies.  You will be contacted with the lab results as soon as they are available. The fastest way to get your results is to activate your My Chart account. Instructions are located on the last page of this paperwork. If you have not heard from [phone] regarding the results in 2 weeks, please contact this office.

## 2020-10-23 NOTE — Progress Notes (Signed)
Established Patient Office Visit  Subjective:  Patient ID: Ronald [name], male    DOB: 16-May-1980  Age: 40 y.o. MRN: [id number]  CC:  Chief Complaint  Patient presents with   Depression    Patient states he is here because he is having some issues with some depression and anxiety. Patient has been dealing with a lot of family members dying or been killed. He states he was DX with PTSD but has seem to have more problems. PHQ9=21 GAD7=20 very difficult for both.    HPI Ronald [name] presents for depression  Unfortunately a cousin of his passed away last week Doing worse with depression over the past few weeks anyway Has increased clonazepam use to twice daily most days after being down to 1 every other day or so Having trouble sleeping No psychosis Long hx of PTSD  Mother is on wellbutrin, tolerates well, good therapeutic effect. They share some of the same trauma.  Past Medical History:  Diagnosis Date   Anxiety    Arthritis    Disorder of pineal gland    Post traumatic stress disorder     Past Surgical History:  Procedure Laterality Date   ANKLE SURGERY      No family history on file.  Social History   Socioeconomic History   Marital status: Single    Spouse name: Not on file   Number of children: Not on file   Years of education: Not on file   Highest education level: Not on file  Occupational History   Not on file  Tobacco Use   Smoking status: Never   Smokeless tobacco: Current    Types: Chew  Vaping Use   Vaping Use: Never used  Substance and Sexual Activity   Alcohol use: No   Drug use: Yes    Types: Amphetamines, Benzodiazepines, Cocaine, Marijuana, Methamphetamines    Comment: one year ago   Sexual activity: Never  Other Topics Concern   Not on file  Social History Narrative   Not on file   Social Determinants of Health   Financial Resource Strain: Not on file  Food Insecurity: Not on file  Transportation Needs: Not on file   Physical Activity: Not on file  Stress: Not on file  Social Connections: Not on file  Intimate Partner Violence: Not on file    Outpatient Medications Prior to Visit  Medication Sig Dispense Refill   acetic acid-hydrocortisone (VOSOL-HC) OTIC solution Place 3 drops into both ears 3 (three) times daily as needed. 10 mL 0   clonazePAM (KLONOPIN) 1 MG tablet Take 1 tablet (1 mg total) by mouth 2 (two) times daily. 30 tablet 0   Melatonin 10 MG CAPS Take 10 mg by mouth at bedtime.     OLANZapine (ZYPREXA) 10 MG tablet Take 1 tablet (10 mg total) by mouth in the morning and at bedtime. 60 tablet 0   No facility-administered medications prior to visit.    Allergies  Allergen Reactions   Hydroxyzine Anxiety    Other reaction(s): Other "hyper "   Benadryl [Diphenhydramine]     itching   Other     All antihistamines   Penicillins     Mother states he's not allergic to this    ROS Review of Systems  Constitutional: Negative.   HENT: Negative.    Eyes: Negative.   Respiratory: Negative.    Cardiovascular: Negative.   Gastrointestinal: Negative.   Endocrine: Negative.   Genitourinary: Negative.   Musculoskeletal:  Negative.   Skin: Negative.   Allergic/Immunologic: Negative.   Neurological: Negative.   Hematological: Negative.   Psychiatric/Behavioral: Negative.    All other systems reviewed and are negative.    Objective:    Physical Exam Constitutional:      General: He is not in acute distress.    Appearance: Normal appearance. He is normal weight. He is not ill-appearing, toxic-appearing or diaphoretic.  Cardiovascular:     Rate and Rhythm: Normal rate and regular rhythm.     Heart sounds: Normal heart sounds. No murmur heard.   No friction rub. No gallop.  Pulmonary:     Effort: Pulmonary effort is normal. No respiratory distress.     Breath sounds: Normal breath sounds. No stridor. No wheezing, rhonchi or rales.  Chest:     Chest wall: No tenderness.   Neurological:     General: No focal deficit present.     Mental Status: He is alert and oriented to person, place, and time. Mental status is at baseline.  Psychiatric:        Mood and Affect: Mood normal.        Behavior: Behavior normal.        Thought Content: Thought content normal.        Judgment: Judgment normal.    BP (!) 132/93   Pulse 99   Temp 98.3 F (36.8 C) (Temporal)   Resp 18   Ht 5\' 11"  (1.803 m)   Wt 167 lb (75.8 kg)   SpO2 99%   BMI 23.29 kg/m  Wt Readings from Last 3 Encounters:  10/23/20 167 lb (75.8 kg)  09/20/20 173 lb 4 oz (78.6 kg)  02/19/20 171 lb (77.6 kg)     There are no preventive care reminders to display for this patient.  There are no preventive care reminders to display for this patient.  Lab Results  Component Value Date   TSH 1.898 02/19/2020   Lab Results  Component Value Date   WBC 10.8 (H) 02/19/2020   HGB 15.7 02/19/2020   HCT 45.0 02/19/2020   MCV 88.8 02/19/2020   PLT 240 02/19/2020   Lab Results  Component Value Date   NA 139 02/19/2020   K 3.7 02/19/2020   CO2 25 02/19/2020   GLUCOSE 111 (H) 02/19/2020   BUN 15 02/19/2020   CREATININE 1.18 02/19/2020   BILITOT 1.9 (H) 02/19/2020   ALKPHOS 54 02/19/2020   AST 111 (H) 02/19/2020   ALT 52 (H) 02/19/2020   PROT 7.6 02/19/2020   ALBUMIN 4.7 02/19/2020   CALCIUM 9.8 02/19/2020   ANIONGAP 13 02/19/2020   Lab Results  Component Value Date   CHOL 131 02/19/2020   Lab Results  Component Value Date   HDL 61 02/19/2020   Lab Results  Component Value Date   LDLCALC 55 02/19/2020   Lab Results  Component Value Date   TRIG 76 02/19/2020   Lab Results  Component Value Date   CHOLHDL 2.1 02/19/2020   Lab Results  Component Value Date   HGBA1C 5.0 02/19/2020      Assessment & Plan:   Problem List Items Addressed This Visit   None Visit Diagnoses     Moderate episode of recurrent major depressive disorder (HCC)    -  Primary       No orders of  the defined types were placed in this encounter.   Follow-up: No follow-ups on file.   PLAN Start wellbutrin 150mg  SR po qd.  Can increase to bid after 2 weeks if tolerating well Med check in 6 weeks Refill clonazepam Patient encouraged to call clinic with any questions, comments, or concerns.  [name], NP

## 2020-10-30 ENCOUNTER — Telehealth: Payer: Self-pay | Admitting: Registered Nurse

## 2020-10-30 NOTE — Telephone Encounter (Signed)
Pt called in if Ronald [name] would call in Trazodone to help with sleep to the [facility] on [address].  Please advise   Pt can be reached at the home #

## 2020-10-31 ENCOUNTER — Ambulatory Visit (INDEPENDENT_AMBULATORY_CARE_PROVIDER_SITE_OTHER): Payer: Self-pay | Admitting: Registered Nurse

## 2020-10-31 ENCOUNTER — Encounter: Payer: Self-pay | Admitting: Registered Nurse

## 2020-10-31 ENCOUNTER — Other Ambulatory Visit: Payer: Self-pay

## 2020-10-31 VITALS — BP 147/108 | HR 72 | Temp 98.0°F | Resp 18 | Ht 71.0 in | Wt 157.0 lb

## 2020-10-31 DIAGNOSIS — G479 Sleep disorder, unspecified: Secondary | ICD-10-CM

## 2020-10-31 MED ORDER — TRAZODONE HCL 50 MG PO TABS: 25.0000 mg | ORAL_TABLET | Freq: Every evening | ORAL | 3 refills | Status: DC | PRN

## 2020-10-31 NOTE — Patient Instructions (Signed)
° ° ° °  If you have lab work done today you will be contacted with your lab results within the next 2 weeks.  If you have not heard from us then please contact us. The fastest way to get your results is to register for My Chart. ° ° °IF you received an x-ray today, you will receive an invoice from [facility]. Please contact [facility] at [phone] with questions or concerns regarding your invoice.  ° °IF you received labwork today, you will receive an invoice from [facility]. Please contact [facility] at [phone] with questions or concerns regarding your invoice.  ° °Our billing staff will not be able to assist you with questions regarding bills from these companies. ° °You will be contacted with the lab results as soon as they are available. The fastest way to get your results is to activate your My Chart account. Instructions are located on the last page of this paperwork. If you have not heard from us regarding the results in 2 weeks, please contact this office. °  ° ° ° °

## 2020-11-22 ENCOUNTER — Telehealth: Payer: Self-pay

## 2020-11-22 ENCOUNTER — Other Ambulatory Visit: Payer: Self-pay | Admitting: Registered Nurse

## 2020-11-22 DIAGNOSIS — F331 Major depressive disorder, recurrent, moderate: Secondary | ICD-10-CM

## 2020-11-22 MED ORDER — CLONAZEPAM 1 MG PO TABS: 1.0000 mg | ORAL_TABLET | Freq: Two times a day (BID) | ORAL | 2 refills | Status: DC

## 2020-11-22 NOTE — Telephone Encounter (Signed)
Sent Thanks Rich

## 2020-11-22 NOTE — Telephone Encounter (Signed)
Pt is prescribed clonazePAM (KL ONOPIN) 1 MG tablet RX is wrote twice a day and only 30 was sent can this be corrected and sent back to   [facility] [address], [name] - [address]  [address] [name] [address]   Pt call back [phone]

## 2020-11-24 ENCOUNTER — Other Ambulatory Visit: Payer: Self-pay

## 2020-11-24 DIAGNOSIS — F331 Major depressive disorder, recurrent, moderate: Secondary | ICD-10-CM

## 2020-11-24 NOTE — Telephone Encounter (Signed)
[facility] [address] Sent another rx request for provider to sign off on.

## 2020-11-24 NOTE — Telephone Encounter (Signed)
Rx Was sent to wrong pharmacy it needs to go to   [facility] [address], [name] - [address]  [address] [name] [address]   [facility]

## 2020-11-24 NOTE — Telephone Encounter (Signed)
Rx Was sent to wrong pharmacy it needs to go to    [facility] [address] [name] - [address]  [address] [name] [address]    [facility]    [facility] and [facility] [address] Prescription for clonazepam cancelled. Just needs to be sent to [facility]. Pharmacy updated for you to send.

## 2020-11-27 ENCOUNTER — Other Ambulatory Visit: Payer: Self-pay | Admitting: Registered Nurse

## 2020-11-27 DIAGNOSIS — F331 Major depressive disorder, recurrent, moderate: Secondary | ICD-10-CM

## 2020-11-27 MED ORDER — CLONAZEPAM 1 MG PO TABS: 1.0000 mg | ORAL_TABLET | Freq: Two times a day (BID) | ORAL | 2 refills | Status: DC

## 2020-12-06 ENCOUNTER — Telehealth: Payer: Self-pay | Admitting: Registered Nurse

## 2020-12-06 ENCOUNTER — Encounter: Payer: Self-pay | Admitting: Registered Nurse

## 2020-12-06 ENCOUNTER — Telehealth (INDEPENDENT_AMBULATORY_CARE_PROVIDER_SITE_OTHER): Payer: Self-pay | Admitting: Registered Nurse

## 2020-12-06 ENCOUNTER — Other Ambulatory Visit: Payer: Self-pay

## 2020-12-06 DIAGNOSIS — F39 Unspecified mood [affective] disorder: Secondary | ICD-10-CM

## 2020-12-06 DIAGNOSIS — F331 Major depressive disorder, recurrent, moderate: Secondary | ICD-10-CM

## 2020-12-06 NOTE — Progress Notes (Signed)
Telemedicine Encounter- SOAP NOTE Established Patient  This telephone encounter was conducted with the patient's (or proxy's) verbal consent via audio telecommunications: yes  Patient was instructed to have this encounter in a suitably private space; and to only have persons present to whom they give permission to participate. In addition, patient identity was confirmed by use of name plus two identifiers (DOB and address).  I discussed the limitations, risks, security and privacy concerns of performing an evaluation and management service by telephone and the availability of in person appointments. I also discussed with the patient that there may be a patient responsible charge related to this service. The patient expressed understanding and agreed to proceed.  I spent a total of 16 minutes talking with the patient or their proxy.  Patient at home Provider in office  Participants: Jari Sportsman, NP and [name]  CC: Med check  Subjective   Ronald [name] is a 40 y.o. established patient. Telephone visit today for med check  HPI Had started on Wellbutrin 150mg  XR po qd and increase to bid about 6 weeks ago Unfortunately had AE of "feeling weird" and "off"  Stopped taking it 2-3 weeks ago  Still has some triggers for anxiety - living in house in which his brother passed away as a victim of violence Mother has "shrines" around the home that cause anxiety for him  He is working to save up for a place of his own  Has been holding down new job without concerns Doing well with klonopin and trazodone Does not feel he needs further intervention at this time.  Patient Active Problem List   Diagnosis Date Noted   Methamphetamine use disorder, mild, abuse (HCC) 02/20/2020   Cocaine use disorder (HCC) 02/20/2020   Polysubstance abuse (HCC) 02/20/2020   Paranoia (HCC) 02/19/2020   Substance induced mood disorder (HCC) 02/19/2020   Schizophrenia, paranoid type (HCC) 12/23/2016    PTSD (post-traumatic stress disorder) 12/15/2016   Bipolar I disorder, single manic episode, severe with psychotic features (HCC) 09/29/2015   Mood disorder (HCC) 09/22/2015   Disorder of pineal gland     Past Medical History:  Diagnosis Date   Anxiety    Arthritis    Disorder of pineal gland    Post traumatic stress disorder     Current Outpatient Medications  Medication Sig Dispense Refill   acetic acid-hydrocortisone (VOSOL-HC) OTIC solution Place 3 drops into both ears 3 (three) times daily as needed. 10 mL 0   buPROPion (WELLBUTRIN SR) 150 MG 12 hr tablet Take 1 tablet (150 mg total) by mouth 2 (two) times daily. 90 tablet 1   clonazePAM (KLONOPIN) 1 MG tablet Take 1 tablet (1 mg total) by mouth 2 (two) times daily. 60 tablet 2   Melatonin 10 MG CAPS Take 10 mg by mouth at bedtime.     traZODone (DESYREL) 50 MG tablet Take 0.5-1 tablets (25-50 mg total) by mouth at bedtime as needed for sleep. 90 tablet 3   No current facility-administered medications for this visit.    Allergies  Allergen Reactions   Hydroxyzine Anxiety    Other reaction(s): Other "hyper "   Benadryl [Diphenhydramine]     itching   Other     All antihistamines   Penicillins     Mother states he's not allergic to this    Social History   Socioeconomic History   Marital status: Single    Spouse name: Not on file   Number of  children: Not on file   Years of education: Not on file   Highest education level: Not on file  Occupational History   Not on file  Tobacco Use   Smoking status: Never   Smokeless tobacco: Current    Types: Chew  Vaping Use   Vaping Use: Never used  Substance and Sexual Activity   Alcohol use: No   Drug use: Yes    Types: Amphetamines, Benzodiazepines, Cocaine, Marijuana, Methamphetamines    Comment: one year ago   Sexual activity: Never  Other Topics Concern   Not on file  Social History Narrative   Not on file   Social Determinants of Health   Financial  Resource Strain: Not on file  Food Insecurity: Not on file  Transportation Needs: Not on file  Physical Activity: Not on file  Stress: Not on file  Social Connections: Not on file  Intimate Partner Violence: Not on file    Review of Systems  Constitutional: Negative.   HENT: Negative.    Eyes: Negative.   Respiratory: Negative.    Cardiovascular: Negative.   Gastrointestinal: Negative.   Genitourinary: Negative.   Musculoskeletal: Negative.   Skin: Negative.   Neurological: Negative.   Endo/Heme/Allergies: Negative.   Psychiatric/Behavioral:  Negative for depression, hallucinations, memory loss, substance abuse and suicidal ideas. The patient is nervous/anxious. The patient does not have insomnia.   All other systems reviewed and are negative.  Objective   Vitals as reported by the patient: There were no vitals filed for this visit.  Diagnoses and all orders for this visit:  Moderate episode of recurrent major depressive disorder (HCC)  Mood disorder (HCC)   PLAN Continue on prn clonazepam. Discussed risks/benefits/alternatives. Return in 3 mo for med check Encouraged his lifestyle changes to improve his health Patient encouraged to call clinic with any questions, comments, or concerns.  I discussed the assessment and treatment plan with the patient. The patient was provided an opportunity to ask questions and all were answered. The patient agreed with the plan and demonstrated an understanding of the instructions.   The patient was advised to call back or seek an in-person evaluation if the symptoms worsen or if the condition fails to improve as anticipated.  I provided 16 minutes of non-face-to-face time during this encounter.  [name], NP

## 2021-01-19 ENCOUNTER — Encounter: Payer: Self-pay | Admitting: Physician Assistant

## 2021-01-19 NOTE — Progress Notes (Signed)
COULD NOT GET IN TOUCH WITH PATIENT FOR VISIT

## 2021-01-22 ENCOUNTER — Ambulatory Visit: Payer: Self-pay | Admitting: Registered Nurse

## 2021-01-27 NOTE — Progress Notes (Signed)
Established Patient Office Visit  Subjective:  Patient ID: Ronald [name], male    DOB: 1980-07-20  Age: 40 y.o. MRN: [id number]  CC:  Chief Complaint  Patient presents with  . Medication Refill    Patient states he is here for medication refill.    HPI Ronald [name] presents for medication refill  Reports he's doing well with trazodone No AE He is settling into himself more - feeling better - employed  Using clonazepam less  Looking forward to moving, no date yet.  Past Medical History:  Diagnosis Date  . Anxiety   . Arthritis   . Disorder of pineal gland   . Post traumatic stress disorder     Past Surgical History:  Procedure Laterality Date  . ANKLE SURGERY      No family history on file.  Social History   Socioeconomic History  . Marital status: Single    Spouse name: Not on file  . Number of children: Not on file  . Years of education: Not on file  . Highest education level: Not on file  Occupational History  . Not on file  Tobacco Use  . Smoking status: Never  . Smokeless tobacco: Current    Types: Chew  Vaping Use  . Vaping Use: Never used  Substance and Sexual Activity  . Alcohol use: No  . Drug use: Yes    Types: Amphetamines, Benzodiazepines, Cocaine, Marijuana, Methamphetamines    Comment: one year ago  . Sexual activity: Never  Other Topics Concern  . Not on file  Social History Narrative  . Not on file   Social Determinants of Health   Financial Resource Strain: Not on file  Food Insecurity: Not on file  Transportation Needs: Not on file  Physical Activity: Not on file  Stress: Not on file  Social Connections: Not on file  Intimate Partner Violence: Not on file    Outpatient Medications Prior to Visit  Medication Sig Dispense Refill  . acetic acid-hydrocortisone (VOSOL-HC) OTIC solution Place 3 drops into both ears 3 (three) times daily as needed. 10 mL 0  . buPROPion (WELLBUTRIN SR) 150 MG 12 hr tablet Take 1 tablet  (150 mg total) by mouth 2 (two) times daily. 90 tablet 1  . Melatonin 10 MG CAPS Take 10 mg by mouth at bedtime.    . clonazePAM (KLONOPIN) 1 MG tablet Take 1 tablet (1 mg total) by mouth 2 (two) times daily. 30 tablet 0   No facility-administered medications prior to visit.    Allergies  Allergen Reactions  . Hydroxyzine Anxiety    Other reaction(s): Other "hyper "  . Benadryl [Diphenhydramine]     itching  . Other     All antihistamines  . Penicillins     Mother states he's not allergic to this    ROS Review of Systems  Constitutional: Negative.   HENT: Negative.    Eyes: Negative.   Respiratory: Negative.    Cardiovascular: Negative.   Gastrointestinal: Negative.   Genitourinary: Negative.   Musculoskeletal: Negative.   Skin: Negative.   Neurological: Negative.   Psychiatric/Behavioral: Negative.    All other systems reviewed and are negative.    Objective:    Physical Exam Constitutional:      General: He is not in acute distress.    Appearance: Normal appearance. He is normal weight. He is not ill-appearing, toxic-appearing or diaphoretic.  Cardiovascular:     Rate and Rhythm: Normal rate and regular rhythm.  Heart sounds: Normal heart sounds. No murmur heard.   No friction rub. No gallop.  Pulmonary:     Effort: Pulmonary effort is normal. No respiratory distress.     Breath sounds: Normal breath sounds. No stridor. No wheezing, rhonchi or rales.  Chest:     Chest wall: No tenderness.  Neurological:     General: No focal deficit present.     Mental Status: He is alert and oriented to person, place, and time. Mental status is at baseline.  Psychiatric:        Mood and Affect: Mood normal.        Behavior: Behavior normal.        Thought Content: Thought content normal.        Judgment: Judgment normal.    BP (!) 147/108   Pulse 72   Temp 98 F (36.7 C) (Temporal)   Resp 18   Ht 5\' 11"  (1.803 m)   Wt 157 lb (71.2 kg)   SpO2 99%   BMI 21.90  kg/m  Wt Readings from Last 3 Encounters:  10/31/20 157 lb (71.2 kg)  10/23/20 167 lb (75.8 kg)  09/20/20 173 lb 4 oz (78.6 kg)     Health Maintenance Due  Topic Date Due  . COVID-19 Vaccine (1) Never done  . INFLUENZA VACCINE  Never done    There are no preventive care reminders to display for this patient.  Lab Results  Component Value Date   TSH 1.898 02/19/2020   Lab Results  Component Value Date   WBC 10.8 (H) 02/19/2020   HGB 15.7 02/19/2020   HCT 45.0 02/19/2020   MCV 88.8 02/19/2020   PLT 240 02/19/2020   Lab Results  Component Value Date   NA 139 02/19/2020   K 3.7 02/19/2020   CO2 25 02/19/2020   GLUCOSE 111 (H) 02/19/2020   BUN 15 02/19/2020   CREATININE 1.18 02/19/2020   BILITOT 1.9 (H) 02/19/2020   ALKPHOS 54 02/19/2020   AST 111 (H) 02/19/2020   ALT 52 (H) 02/19/2020   PROT 7.6 02/19/2020   ALBUMIN 4.7 02/19/2020   CALCIUM 9.8 02/19/2020   ANIONGAP 13 02/19/2020   Lab Results  Component Value Date   CHOL 131 02/19/2020   Lab Results  Component Value Date   HDL 61 02/19/2020   Lab Results  Component Value Date   LDLCALC 55 02/19/2020   Lab Results  Component Value Date   TRIG 76 02/19/2020   Lab Results  Component Value Date   CHOLHDL 2.1 02/19/2020   Lab Results  Component Value Date   HGBA1C 5.0 02/19/2020      Assessment & Plan:   Problem List Items Addressed This Visit   None Visit Diagnoses     Sleep disturbance    -  Primary   Relevant Medications   traZODone (DESYREL) 50 MG tablet       Meds ordered this encounter  Medications  . traZODone (DESYREL) 50 MG tablet    Sig: Take 0.5-1 tablets (25-50 mg total) by mouth at bedtime as needed for sleep.    Dispense:  90 tablet    Refill:  3    Order Specific Question:   Supervising Provider    Answer:   13/09/2019, JEFFREY R [[id number]]    Follow-up: No follow-ups on file.   PLAN Refill trazodone Return in 3-6 mo for CPE and labs Patient encouraged to call clinic  with any questions, comments, or concerns.  Olamide Lahaie  Orland Mustard, NP

## 2021-02-26 ENCOUNTER — Other Ambulatory Visit: Payer: Self-pay | Admitting: Registered Nurse

## 2021-02-26 DIAGNOSIS — F331 Major depressive disorder, recurrent, moderate: Secondary | ICD-10-CM

## 2021-03-21 ENCOUNTER — Ambulatory Visit (INDEPENDENT_AMBULATORY_CARE_PROVIDER_SITE_OTHER): Payer: Self-pay | Admitting: Registered Nurse

## 2021-03-21 DIAGNOSIS — F331 Major depressive disorder, recurrent, moderate: Secondary | ICD-10-CM

## 2021-03-21 MED ORDER — CLONAZEPAM 1 MG PO TABS: 1.0000 mg | ORAL_TABLET | Freq: Two times a day (BID) | ORAL | 0 refills | Status: DC

## 2021-03-21 NOTE — Patient Instructions (Signed)
Mr. Poke -   Randie Heinz to see you  Refills x 6 mo  See you then, call sooner if you need anything   Thank you  Luan Pulling

## 2021-04-23 NOTE — Progress Notes (Signed)
Established Patient Office Visit  Subjective:  Patient ID: Ronald [name], male    DOB: 05-Jun-1980  Age: 41 y.o. 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   Denies hi/si. Feels very stable with clonazepam. Would like to continue  Has tried a variety of SSRIs and SNRIs in the past with limited effect.  Past Medical History:  Diagnosis Date   Anxiety    Arthritis    Disorder of pineal gland    Post traumatic stress disorder     Past Surgical History:  Procedure Laterality Date   ANKLE SURGERY      No family history on file.  Social History   Socioeconomic History   Marital status: Single    Spouse name: Not on file   Number of children: Not on file   Years of education: Not on file   Highest education level: Not on file  Occupational History   Not on file  Tobacco Use   Smoking status: Never   Smokeless tobacco: Current    Types: Chew  Vaping Use   Vaping Use: Never used  Substance and Sexual Activity   Alcohol use: No   Drug use: Yes    Types: Amphetamines, Benzodiazepines, Cocaine, Marijuana, Methamphetamines    Comment: one year ago   Sexual activity: Never  Other Topics Concern   Not on file  Social History Narrative   Not on file   Social Determinants of Health   Financial Resource Strain: Not on file  Food Insecurity: Not on file  Transportation Needs: Not on file  Physical Activity: Not on file  Stress: Not on file  Social Connections: Not on file  Intimate  Partner Violence: Not on file    Outpatient Medications Prior to Visit  Medication Sig Dispense Refill   acetic acid-hydrocortisone (VOSOL-HC) OTIC solution Place 3 drops into both ears 3 (three) times daily as needed. 10 mL 0   Melatonin 10 MG CAPS Take 10 mg by mouth at bedtime.     traZODone (DESYREL) 50 MG tablet Take 0.5-1 tablets (25-50 mg total) by mouth at bedtime as needed for sleep. 90 tablet 3   clonazePAM (KLONOPIN) 1 MG tablet Take 1 tablet by mouth twice daily 60 tablet 0   buPROPion (WELLBUTRIN SR) 150 MG 12 hr tablet Take 1 tablet (150 mg total) by mouth 2 (two) times daily. (Patient not taking: Reported on 03/21/2021) 90 tablet 1   No facility-administered medications prior to visit.    Allergies  Allergen Reactions   Hydroxyzine Anxiety    Other reaction(s): Other "hyper "   Benadryl [Diphenhydramine]     itching   Other     All antihistamines   Penicillins     Mother states he's not allergic to this    ROS Review of Systems  Constitutional: Negative.   HENT: Negative.    Eyes: Negative.   Respiratory: Negative.    Cardiovascular: Negative.   Gastrointestinal: Negative.  Genitourinary: Negative.   Musculoskeletal: Negative.   Skin: Negative.   Neurological: Negative.   Psychiatric/Behavioral: Negative.    All other systems reviewed and are negative.    Objective:    Physical Exam Constitutional:      General: He is not in acute distress.    Appearance: Normal appearance. He is normal weight. He is not ill-appearing, toxic-appearing or diaphoretic.  Cardiovascular:     Rate and Rhythm: Normal rate and regular rhythm.     Heart sounds: Normal heart sounds. No murmur heard.   No friction rub. No gallop.  Pulmonary:     Effort: Pulmonary effort is normal. No respiratory distress.     Breath sounds: Normal breath sounds. No stridor. No wheezing, rhonchi or rales.  Chest:     Chest wall: No tenderness.  Neurological:     General: No focal deficit  present.     Mental Status: He is alert and oriented to person, place, and time. Mental status is at baseline.  Psychiatric:        Mood and Affect: Mood normal.        Behavior: Behavior normal.        Thought Content: Thought content normal.        Judgment: Judgment normal.    BP 122/78    Pulse 66    Temp 98.2 F (36.8 C) (Temporal)    Resp 16    Ht 5\' 11"  (1.803 m)    Wt 179 lb 9.6 oz (81.5 kg)    SpO2 97%    BMI 25.05 kg/m  Wt Readings from Last 3 Encounters:  03/21/21 179 lb 9.6 oz (81.5 kg)  10/31/20 157 lb (71.2 kg)  10/23/20 167 lb (75.8 kg)     Health Maintenance Due  Topic Date Due   COVID-19 Vaccine (1) Never done   HIV Screening  Never done   Hepatitis C Screening  Never done    There are no preventive care reminders to display for this patient.  Lab Results  Component Value Date   TSH 1.898 02/19/2020   Lab Results  Component Value Date   WBC 10.8 (H) 02/19/2020   HGB 15.7 02/19/2020   HCT 45.0 02/19/2020   MCV 88.8 02/19/2020   PLT 240 02/19/2020   Lab Results  Component Value Date   NA 139 02/19/2020   K 3.7 02/19/2020   CO2 25 02/19/2020   GLUCOSE 111 (H) 02/19/2020   BUN 15 02/19/2020   CREATININE 1.18 02/19/2020   BILITOT 1.9 (H) 02/19/2020   ALKPHOS 54 02/19/2020   AST 111 (H) 02/19/2020   ALT 52 (H) 02/19/2020   PROT 7.6 02/19/2020   ALBUMIN 4.7 02/19/2020   CALCIUM 9.8 02/19/2020   ANIONGAP 13 02/19/2020   Lab Results  Component Value Date   CHOL 131 02/19/2020   Lab Results  Component Value Date   HDL 61 02/19/2020   Lab Results  Component Value Date   LDLCALC 55 02/19/2020   Lab Results  Component Value Date   TRIG 76 02/19/2020   Lab Results  Component Value Date   CHOLHDL 2.1 02/19/2020   Lab Results  Component Value Date   HGBA1C 5.0 02/19/2020      Assessment & Plan:   Problem List Items Addressed This Visit   None Visit Diagnoses     Moderate episode of recurrent major depressive disorder (HCC)        Relevant Medications   clonazePAM (KLONOPIN) 1 MG  tablet       Meds ordered this encounter  Medications   clonazePAM (KLONOPIN) 1 MG tablet    Sig: Take 1 tablet (1 mg total) by mouth 2 (two) times daily.    Dispense:  60 tablet    Refill:  0    Follow-up: Return in about 6 months (around 09/19/2021) for klonopin med check.   PLAN Continue on clonazepam.  Return in 6 mo for med check Encourage pt to pursue insurance options Patient encouraged to call clinic with any questions, comments, or concerns.  Maximiano Coss, NP

## 2021-04-26 ENCOUNTER — Other Ambulatory Visit: Payer: Self-pay | Admitting: Registered Nurse

## 2021-04-26 DIAGNOSIS — F331 Major depressive disorder, recurrent, moderate: Secondary | ICD-10-CM

## 2021-04-26 NOTE — Telephone Encounter (Signed)
Patient is requesting a refill of the following medications: Requested Prescriptions   Pending Prescriptions Disp Refills   clonazePAM (KLONOPIN) 1 MG tablet [Pharmacy Med Name: clonazePAM 1 MG Oral Tablet] 60 tablet 0    Sig: Take 1 tablet by mouth twice daily    Date of patient request: 04/26/2021 Last office visit: 03/21/2021 Date of last refill: 03/21/2021 Last refill amount: 60 tablets  Follow up time period per chart: 09/19/2021

## 2021-04-30 ENCOUNTER — Other Ambulatory Visit: Payer: Self-pay | Admitting: Registered Nurse

## 2021-04-30 DIAGNOSIS — F331 Major depressive disorder, recurrent, moderate: Secondary | ICD-10-CM

## 2021-05-24 ENCOUNTER — Telehealth: Payer: Self-pay | Admitting: *Deleted

## 2021-05-24 NOTE — Telephone Encounter (Signed)
Patient's mother called asking to talk to [name], NP or his CMA. She states that he is concerned about his mental health. She states he is saying the internet is invented by aliens, took his brothers urn and broke it on the road. He has been unplugging internet service due to modem being too loud for him to sleep. He is asking her to not use the computer and he is speaking about 5k that she is not understanding.   Forbis and Louanne Skye called mother to let her know that someone had found the urn on the street busted. Mother had to go down and sweep up sons ashes so they were able to re-urn it. Mother is concerned and does not want him to know she called because he is denying everything. Mother reports being scared of him because he will take knives and take them to his room. She has confiscated all knives in her home. He is stating there is a bio-war and only he can protect them from it. He is also putting alarm on at all times which has lead to paranoia in the past.   I let mother know that Gerlene Burdock will not be here until Monday. She states that she understands and would like the message sent to him for when he returns for documentation purposes. I let her know that I would send to Gerlene Burdock and a physician in clinic. I advised her to seek help if she does not feel safe. She states that she has had to commit him before and knows the measures to take.   She asked that we bring him into the clinic for any reason at all so that we can follow up with him. She stated that the only way he would come in is if we stop him medications.   I let mother know that Flynt would have to call our office to be seen and we would not be able to get him here under false pretense. I also advised her that the care he needs is beyond what primary care can do and I recommended that she talk to him about following up with psychiatry. She states that he refuses to go to therapy or psychiatry because he believes nothing is wrong with  him.   She states that she has had to call law enforcement and they end up fighting with him because they do not know how to deal with someone with PTSD and he ends up with charges. She would not like to call them again. I recommended she do what she needed to to stay safe and she verbalized agreement.   I let her know that I would document our conversation and if there were any further recommendations she would receive a call.   Mother's number [phone] (cell phone)

## 2021-05-24 NOTE — Telephone Encounter (Signed)
Noted.  Message sent to patient's PCP but would recommend that if family is concerned about his safety, and he is not voluntarily being seen for care, would recommend contacting law enforcement as he may need involuntary mental health evaluation.

## 2021-05-28 NOTE — Telephone Encounter (Signed)
Thank you for advising on this  Would echo advice. Have discussed psyhciatric referral with patient in the past but he has poor insight into his mental health. Will continue to have these conversations with him when I see him.  Thanks,  Luan Pulling

## 2021-05-29 ENCOUNTER — Other Ambulatory Visit: Payer: Self-pay

## 2021-05-29 ENCOUNTER — Emergency Department (HOSPITAL_COMMUNITY)
Admission: EM | Admit: 2021-05-29 | Discharge: 2021-06-07 | Disposition: A | Discharge status: Other Acute Inpatient Hospital | Payer: Self-pay | Attending: Emergency Medicine | Admitting: Emergency Medicine

## 2021-05-29 DIAGNOSIS — R456 Violent behavior: Secondary | ICD-10-CM | POA: Insufficient documentation

## 2021-05-29 DIAGNOSIS — Z789 Other specified health status: Secondary | ICD-10-CM

## 2021-05-29 DIAGNOSIS — Z20822 Contact with and (suspected) exposure to covid-19: Secondary | ICD-10-CM | POA: Insufficient documentation

## 2021-05-29 DIAGNOSIS — F1090 Alcohol use, unspecified, uncomplicated: Secondary | ICD-10-CM

## 2021-05-29 DIAGNOSIS — F3112 Bipolar disorder, current episode manic without psychotic features, moderate: Secondary | ICD-10-CM

## 2021-05-29 DIAGNOSIS — R45851 Suicidal ideations: Secondary | ICD-10-CM | POA: Insufficient documentation

## 2021-05-29 DIAGNOSIS — Y907 Blood alcohol level of 200-239 mg/100 ml: Secondary | ICD-10-CM | POA: Insufficient documentation

## 2021-05-29 LAB — CBC WITH DIFFERENTIAL/PLATELET
Abs Immature Granulocytes: 0.05 10*3/uL (ref 0.00–0.07)
Basophils Absolute: 0 10*3/uL (ref 0.0–0.1)
Basophils Relative: 1 %
Eosinophils Absolute: 0 10*3/uL (ref 0.0–0.5)
Eosinophils Relative: 0 %
HCT: 40.7 % (ref 39.0–52.0)
Hemoglobin: 14.1 g/dL (ref 13.0–17.0)
Immature Granulocytes: 1 %
Lymphocytes Relative: 32 %
Lymphs Abs: 2 10*3/uL (ref 0.7–4.0)
MCH: 30.7 pg (ref 26.0–34.0)
MCHC: 34.6 g/dL (ref 30.0–36.0)
MCV: 88.5 fL (ref 80.0–100.0)
Monocytes Absolute: 0.4 10*3/uL (ref 0.1–1.0)
Monocytes Relative: 6 %
Neutro Abs: 3.9 10*3/uL (ref 1.7–7.7)
Neutrophils Relative %: 60 %
Platelets: 272 10*3/uL (ref 150–400)
RBC: 4.6 MIL/uL (ref 4.22–5.81)
RDW: 12.4 % (ref 11.5–15.5)
WBC: 6.4 10*3/uL (ref 4.0–10.5)
nRBC: 0 % (ref 0.0–0.2)

## 2021-05-29 LAB — RAPID URINE DRUG SCREEN, HOSP PERFORMED
Amphetamines: NOT DETECTED
Barbiturates: NOT DETECTED
Benzodiazepines: NOT DETECTED
Cocaine: NOT DETECTED
Opiates: NOT DETECTED
Tetrahydrocannabinol: POSITIVE — AB

## 2021-05-29 LAB — COMPREHENSIVE METABOLIC PANEL
ALT: 28 U/L (ref 0–44)
AST: 30 U/L (ref 15–41)
Albumin: 4.3 g/dL (ref 3.5–5.0)
Alkaline Phosphatase: 51 U/L (ref 38–126)
Anion gap: 13 (ref 5–15)
BUN: 8 mg/dL (ref 6–20)
CO2: 27 mmol/L (ref 22–32)
Calcium: 8.8 mg/dL — ABNORMAL LOW (ref 8.9–10.3)
Chloride: 104 mmol/L (ref 98–111)
Creatinine, Ser: 0.95 mg/dL (ref 0.61–1.24)
GFR, Estimated: >60 mL/min (ref >60)
Glucose, Bld: 95 mg/dL (ref 70–99)
Potassium: 3.3 mmol/L — ABNORMAL LOW (ref 3.5–5.1)
Sodium: 144 mmol/L (ref 135–145)
Total Bilirubin: 0.5 mg/dL (ref 0.3–1.2)
Total Protein: 6.3 g/dL — ABNORMAL LOW (ref 6.5–8.1)

## 2021-05-29 LAB — ETHANOL: Alcohol, Ethyl (B): 204 mg/dL — ABNORMAL HIGH (ref <10)

## 2021-05-29 MED ORDER — NICOTINE 21 MG/24HR TD PT24
21.0000 mg | MEDICATED_PATCH | Freq: Every day | TRANSDERMAL | Status: DC
  Administered 2021-05-29 – 2021-05-31 (×3): 21 mg via TRANSDERMAL
  Filled 2021-05-29 (×3): qty 1

## 2021-05-29 MED ORDER — ZINC SULFATE 220 (50 ZN) MG PO CAPS
220.0000 mg | ORAL_CAPSULE | Freq: Every day | ORAL | Status: DC
  Filled 2021-05-29 (×4): qty 1

## 2021-05-29 MED ORDER — LORAZEPAM 1 MG PO TABS
2.0000 mg | ORAL_TABLET | Freq: Once | ORAL | Status: AC
  Administered 2021-05-29: 2 mg via ORAL
  Filled 2021-05-29: qty 2

## 2021-05-29 MED ORDER — TRAZODONE HCL 50 MG PO TABS
25.0000 mg | ORAL_TABLET | Freq: Every evening | ORAL | Status: DC | PRN
  Administered 2021-05-30 – 2021-06-06 (×7): 50 mg via ORAL
  Filled 2021-05-29 (×8): qty 1

## 2021-05-29 MED ORDER — CLONAZEPAM 0.5 MG PO TABS
1.0000 mg | ORAL_TABLET | Freq: Two times a day (BID) | ORAL | Status: DC
  Administered 2021-05-30 – 2021-05-31 (×3): 1 mg via ORAL
  Filled 2021-05-29 (×3): qty 2

## 2021-05-29 MED ORDER — ADULT MULTIVITAMIN W/MINERALS CH
1.0000 | ORAL_TABLET | Freq: Every day | ORAL | Status: DC
  Administered 2021-05-30: 1 via ORAL
  Filled 2021-05-29 (×4): qty 1

## 2021-05-29 NOTE — ED Provider Notes (Addendum)
[facility] EMERGENCY DEPARTMENT Provider Note   CSN: [id number] Arrival date & time: 05/29/21  1830     History  Chief Complaint  Patient presents with   IVC    Ronald [name] is a 41 y.o. male.  41 year old male presents under IVC due to concern for suicidal ideations.  Patient also reportedly was drinking alcohol today although he denies that.  His mother placed under IVC and went police came to transport him he became violent and required 6 officers to restrain him.  According to the petition, patient stated that aliens have been speaking to him.  He does have a prior psychiatric history.  Patient fully compliant with his sister      Home Medications Prior to Admission medications   Medication Sig Start Date End Date Taking? Authorizing Provider  acetic acid-hydrocortisone (VOSOL-HC) OTIC solution Place 3 drops into both ears 3 (three) times daily as needed. 09/20/20   [name], NP  clonazePAM Scarlette Calico) 1 MG tablet Take 1 tablet by mouth twice daily 04/27/21   [name], NP  Melatonin 10 MG CAPS Take 10 mg by mouth at bedtime. 12/23/16   Sherren Mocha, MD  traZODone (DESYREL) 50 MG tablet Take 0.5-1 tablets (25-50 mg total) by mouth at bedtime as needed for sleep. 10/31/20   [name], NP      Allergies    Hydroxyzine, Benadryl [diphenhydramine], Other, and Penicillins    Review of Systems   Review of Systems  Unable to perform ROS: Psychiatric disorder  All other systems reviewed and are negative.  Physical Exam Updated Vital Signs Ht 1.803 m (5\' 11" )    Wt 77.1 kg    BMI 23.71 kg/m  Physical Exam Vitals and nursing note reviewed.  Constitutional:      General: He is not in acute distress.    Appearance: Normal appearance. He is well-developed. He is not toxic-appearing.  HENT:     Head: Normocephalic and atraumatic.  Eyes:     General: Lids are normal.     Conjunctiva/sclera: Conjunctivae normal.     Pupils: Pupils are equal,  round, and reactive to light.  Neck:     Thyroid: No thyroid mass.     Trachea: No tracheal deviation.  Cardiovascular:     Rate and Rhythm: Normal rate and regular rhythm.     Heart sounds: Normal heart sounds. No murmur heard.   No gallop.  Pulmonary:     Effort: Pulmonary effort is normal. No respiratory distress.     Breath sounds: Normal breath sounds. No stridor. No decreased breath sounds, wheezing, rhonchi or rales.  Abdominal:     General: There is no distension.     Palpations: Abdomen is soft.     Tenderness: There is no abdominal tenderness. There is no rebound.  Musculoskeletal:        General: No tenderness. Normal range of motion.     Cervical back: Normal range of motion and neck supple.  Skin:    General: Skin is warm and dry.     Findings: No abrasion or rash.  Neurological:     General: No focal deficit present.     Mental Status: He is alert and oriented to person, place, and time. Mental status is at baseline.     GCS: GCS eye subscore is 4. GCS verbal subscore is 5. GCS motor subscore is 6.     Cranial Nerves: No cranial nerve deficit.  Sensory: No sensory deficit.     Motor: Motor function is intact.     Comments: Uncooperative with exam  Psychiatric:        Attention and Perception: He is inattentive.        Mood and Affect: Mood is elated. Affect is labile and angry.        Speech: Speech is rapid and pressured and tangential.        Behavior: Behavior is agitated, aggressive, hyperactive and combative.    ED Results / Procedures / Treatments   Labs (all labs ordered are listed, but only abnormal results are displayed) Labs Reviewed  RESP PANEL BY RT-PCR (FLU A&B, COVID) ARPGX2  ETHANOL  RAPID URINE DRUG SCREEN, HOSP PERFORMED  CBC WITH DIFFERENTIAL/PLATELET  COMPREHENSIVE METABOLIC PANEL    EKG None  Radiology No results found.  Procedures Procedures    Medications Ordered in ED Medications  LORazepam (ATIVAN) tablet 2 mg (has no  administration in time range)  nicotine (NICODERM CQ - dosed in mg/24 hours) patch 21 mg (has no administration in time range)    ED Course/ Medical Decision Making/ A&P                           Medical Decision Making Amount and/or Complexity of Data Reviewed Labs: ordered.  Risk OTC drugs. Prescription drug management.   Patient medicated with Ativan here and is much more calm.  IVC paperwork filled out.  Alcohol level noted.  Low suspicion that patient took an overdose.  Patient medically clear for psychiatric assessment        Final Clinical Impression(s) / ED Diagnoses Final diagnoses:  None    Rx / DC Orders ED Discharge Orders     None         Lorre Nick, MD 05/29/21 2020    Lorre Nick, MD 05/29/21 2021

## 2021-05-29 NOTE — ED Notes (Signed)
Pt refusing to give urine sample.

## 2021-05-29 NOTE — ED Notes (Addendum)
Attempted to obtain urine sample, pt urinated and refused to give sample. RN present and aware.

## 2021-05-29 NOTE — ED Notes (Signed)
Pt given tooth brush, toothpaste, and water.

## 2021-05-29 NOTE — ED Notes (Signed)
Pt refused COVID swab

## 2021-05-29 NOTE — ED Triage Notes (Signed)
Pt BIB [place] [employer] for threatening his mother. Pt came by police in handcuffs. Pt is aggressive on arrival. Pt denies drugs and ETOH. Pt axox4.

## 2021-05-30 NOTE — ED Notes (Addendum)
When mother was leaving she wanted to make sure to send the message so that it was charted that the pt was given a prescription for klonopin on Jan 23rd for 30 tablets and they are all gone as well as she states the pt. Got into her cabinet and took the last 25 tablets of her xanax.  She also states that the pt took his deceased brothers ashes and urn and dumped them into the road where she and her friend had to recover what she could of the remains.  States the pt. Has been drinking trash bags full of alcohol as well.  She also states that son has been deceased for 12 years and the pt has never touched them.  She states that the pt every 6 months or 1 has episodes like this.  She states during her visit today that the pt. Was blaming her for everything and saying things that did not add up like randomly stating a butterfly stole her things she feels the pt needs inpatient treatment and medication for psych issues.  She also said the pt kept talking about her taking bipolar medication but the mother states she takes thyroid medication he is referring too.

## 2021-05-30 NOTE — ED Notes (Signed)
Pt is requesting his medications be changed to 0800 and 2000, request placed to pharmacy to move times

## 2021-05-30 NOTE — ED Notes (Signed)
Pt is refusing covid swab at this time wants to speak to the dr first

## 2021-05-30 NOTE — ED Notes (Signed)
Spoke with pt mother to inform her of the correct visitor times

## 2021-05-30 NOTE — ED Provider Notes (Signed)
Emergency Medicine Observation Re-evaluation Note  Ronald [name] is a 41 y.o. male, seen on rounds today.  Pt initially presented to the ED for complaints of IVC Currently, the patient is sleeping, having just received sedation.  Physical Exam  BP (!) 138/95 (BP Location: Right Arm)    Pulse 95    Temp 98.9 F (37.2 C) (Oral)    Resp 20    Ht 5\' 11"  (1.803 m)    Wt 77.1 kg    SpO2 99%    BMI 23.71 kg/m  Physical Exam General: No distress, sleeping Cardiac: Regular rate and rhythm Lungs: Increased work of breathing Psych: Sleeping, per nursing, presently interactive earlier before benzodiazepine provision  ED Course / MDM  EKG:   I have reviewed the labs performed to date as well as medications administered while in observation.  Recent changes in the last 24 hours include no notable.  Plan  Current plan is for behavioral health evaluation and placement. is under involuntary commitment.      [name], MD 05/30/21 1034

## 2021-05-30 NOTE — ED Notes (Signed)
Pt keeps coming out of the room regarding seeing an MD by 10 Am he both verbalizes understanding that MD is not promised exactly at 10am and that his medications are due at 10am and 9am is the earliest I can administer them.

## 2021-05-30 NOTE — ED Notes (Signed)
Pt verbalizes understanding that he is allowed to make 2.Marland KitchenMarland KitchenMarland Kitchen phone calls a day   Currently talking to mother for 1;2 calls now

## 2021-05-30 NOTE — ED Notes (Signed)
Pt on the phone making his second phone at this time.  Pt wa asked my [employer] and myself to be aware that if he picked up the phone that would be his second call pt did not want to listen to reason and picked up the phone and made the call anyway so he now used

## 2021-05-31 MED ORDER — LORAZEPAM 2 MG/ML IJ SOLN: 0.0000 mg | Freq: Four times a day (QID) | INTRAMUSCULAR | Status: AC

## 2021-05-31 MED ORDER — LORAZEPAM 2 MG/ML IJ SOLN: 0.0000 mg | Freq: Two times a day (BID) | INTRAMUSCULAR | Status: DC

## 2021-05-31 MED ORDER — LORAZEPAM 1 MG PO TABS
0.0000 mg | ORAL_TABLET | Freq: Four times a day (QID) | ORAL | Status: AC
  Administered 2021-05-31: 2 mg via ORAL
  Administered 2021-06-01 (×2): 1 mg via ORAL
  Administered 2021-06-02 (×3): 2 mg via ORAL
  Filled 2021-05-31 (×4): qty 2
  Filled 2021-05-31 (×2): qty 1
  Filled 2021-05-31: qty 2

## 2021-05-31 MED ORDER — LORAZEPAM 1 MG PO TABS: 0.0000 mg | ORAL_TABLET | Freq: Two times a day (BID) | ORAL | Status: DC

## 2021-05-31 MED ORDER — THIAMINE HCL 100 MG/ML IJ SOLN: 100.0000 mg | Freq: Every day | INTRAMUSCULAR | Status: DC

## 2021-05-31 MED ORDER — THIAMINE HCL 100 MG PO TABS
100.0000 mg | ORAL_TABLET | Freq: Every day | ORAL | Status: DC
  Filled 2021-05-31 (×2): qty 1

## 2021-05-31 MED ORDER — NICOTINE 21 MG/24HR TD PT24
21.0000 mg | MEDICATED_PATCH | Freq: Once | TRANSDERMAL | Status: AC
  Administered 2021-05-31: 21 mg via TRANSDERMAL
  Filled 2021-05-31: qty 1

## 2021-05-31 NOTE — ED Notes (Signed)
Patient out of room stating it is 1640. Informed him it was not and showed him the clock on the wall. He states the stock market had just closed and out time was wrong.

## 2021-05-31 NOTE — BH Assessment (Signed)
Attempted TTS 7:55pm pt agitated and refused to cooperate--told RN and TTS clinician he wanted to wait until 12:30 tomorrow.

## 2021-05-31 NOTE — ED Notes (Signed)
i allowed the pt to open the pill container himself due to being paranoid to it not being what i was saying.  pt opened all medications and took only the klonopin and threw threw all other pills in the trash can

## 2021-05-31 NOTE — ED Notes (Addendum)
Patient called his mother and asked her the time. Then he argued that her time was wrong and that we had changed the clocks on the unit. He then told her not to trust anyone in the neighborhood. He abruptly hung up on her and walked back into his room. Patient is too agitated to allow vital signs to be taken.

## 2021-05-31 NOTE — ED Notes (Signed)
Patient at the door insisting it is 1900. Showed him the clock on the wall, he states it is wrong and the one in his room is right.

## 2021-05-31 NOTE — ED Provider Notes (Signed)
Emergency Medicine Observation Re-evaluation Note  Ronald [name] is a 41 y.o. male, seen on rounds today.  Pt initially presented to the ED for complaints of IVC Currently, the patient is resting comfortably.  Physical Exam  BP (!) 155/120 (BP Location: Right Arm)    Pulse 90    Temp 98.8 F (37.1 C) (Oral)    Resp 19    Ht 5\' 11"  (1.803 m)    Wt 77.1 kg    SpO2 97%    BMI 23.71 kg/m  Physical Exam General: No acute distress Cardiac: Regular rate Lungs: No respiratory distress Psych: Calm  ED Course / MDM  EKG:   I have reviewed the labs performed to date as well as medications administered while in observation.  Recent changes in the last 24 hours include : None.  Plan  Current plan is for patient to await TTS evaluation. Ronald [name] is under involuntary commitment.      Varney Biles, MD 05/31/21 [phone]

## 2021-05-31 NOTE — ED Notes (Addendum)
Patient using second phone call of the day

## 2021-05-31 NOTE — ED Notes (Signed)
Counselor called to TTS pt, pt refused and stated that he won't talk to anyone until 12"30 tomorrow afternoon when his mother gets here. Counselor called and pt told her the same thing.

## 2021-05-31 NOTE — ED Notes (Signed)
Pt in room with door closed. Started yelling he was having panic attack and wanted to speak with mother. Pt used phone to call mother but she did not answer. Left message that people here are talking about killing others and she needs to wake up now. Pt went back to room and closed the door.

## 2021-05-31 NOTE — ED Notes (Signed)
Pt is making one of his 2 phone calls right now to mother.

## 2021-06-01 MED ORDER — ZIPRASIDONE MESYLATE 20 MG IM SOLR
20.0000 mg | Freq: Once | INTRAMUSCULAR | Status: AC | PRN
  Administered 2021-06-02: 20 mg via INTRAMUSCULAR
  Filled 2021-06-01: qty 20

## 2021-06-01 MED ORDER — NICOTINE 21 MG/24HR TD PT24
21.0000 mg | MEDICATED_PATCH | Freq: Every day | TRANSDERMAL | Status: DC
  Administered 2021-06-01 – 2021-06-06 (×5): 21 mg via TRANSDERMAL
  Filled 2021-06-01 (×8): qty 1

## 2021-06-01 MED ORDER — ZIPRASIDONE MESYLATE 20 MG IM SOLR
20.0000 mg | Freq: Once | INTRAMUSCULAR | Status: AC
Start: 2021-06-01 — End: 2021-06-01
  Administered 2021-06-01: 20 mg via INTRAMUSCULAR
  Filled 2021-06-01: qty 20

## 2021-06-01 NOTE — ED Notes (Signed)
Pt made aware of phone privileges being revoked due to the pt calling 911 and improperly using the EMS system. Pt verbalizes understanding.

## 2021-06-01 NOTE — ED Notes (Signed)
Pt's mother here to visit pt

## 2021-06-01 NOTE — ED Notes (Signed)
Pt currently walking around nurses station. Pt calm/cooperative.

## 2021-06-01 NOTE — ED Notes (Signed)
Pt resting comfortably at this time. Visible rise and fall of chest noted. Pt appears in NAD.  

## 2021-06-01 NOTE — ED Notes (Signed)
Pt requesting trazodone for sleep. PRN order given.

## 2021-06-01 NOTE — Progress Notes (Signed)
CSW requested that [facility] [place], RN review for [facility] placement. CSW awaiting response. CSW will assist and follow with placement options.     Maryjean Ka, MSW, [facility] 06/01/2021 10:47 PM

## 2021-06-01 NOTE — ED Notes (Signed)
Pt approached meal tray on the counter and said "this isn't my food. Whose is this?" This RN informed pt that the tray was his. Pt then proceeded to throw the tray in the trash.

## 2021-06-01 NOTE — ED Notes (Signed)
Pt standing at desk watching clock, arguing with staff regarding the time on computers and wall clock being different. This RN informed pt that staff was in charge of patient care not clocks. Pt becoming increasingly agitated.
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
## 2021-06-01 NOTE — ED Notes (Signed)
Pt watching the clock, informing staff that our clocks are incorrect. This RN informed pt that he had no phone calls left for the day as he called his mother twice. Pt stated "stop distracting me".

## 2021-06-01 NOTE — ED Notes (Signed)
Pt belongings found in locker #2 without inventory sheet. Inventory sheet found to be in pt folder. Inventory sheet labeled and placed on locker #2.

## 2021-06-01 NOTE — ED Notes (Signed)
This RN notified sitter that VS can be delayed d/t pt sleeping comfortably. Pt appears to be in NAD.

## 2021-06-01 NOTE — ED Notes (Signed)
Pt requesting to draw. Pt provided with crayons and plain paper. Pt in hallway drawing at this time.

## 2021-06-01 NOTE — BH Assessment (Signed)
TTS spoke to [employer],  to complete TTS assessment.  Clinician to call the cart.

## 2021-06-01 NOTE — ED Notes (Signed)
Dinner trays delivered, pt refusing to eat meal because a sitter placed a spoon on the meal tray and the tray did not get delivered with utensils. Meal tray remains outside pt room.
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
## 2021-06-01 NOTE — ED Notes (Signed)
Pt walking around nurses desk at this time, states he felt like getting some exercise. Pt asking about dinner. Pt calm and cooperative.

## 2021-06-01 NOTE — ED Notes (Signed)
Pt asleep in recliner at this time. Visible rise and fall of chest noted. Pt appears in NAD.
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
## 2021-06-01 NOTE — ED Notes (Signed)
Pt standing at nurse's station, watching clock, stating he is waiting for his mom to come visit, aware of current time and aware that mother will not be visiting until 1230.

## 2021-06-01 NOTE — Progress Notes (Signed)
Inpatient Behavioral Health Placement   Pt meets inpatient criteria per Liborio Nixon, NP. There are no appropriate beds per [facility] Oluwatosin, RN. Referral was sent to the following facilities;    Destination Service Provider Address Phone Fax Patient Preferred  [facility]  [address] [name] [address] [phone] --  [facility]  [address], [name] [address] [phone] --  [facility]  [address] [name] [address] [phone] --  [facility]  [address], [name] [address] [phone] --  [facility]  [address] [name] [address] [phone] --  [facility]  [address] [place], [name] [address] [phone] --  [facility]  [address] [place], [name] [address] [phone] --  [facility]  [phone] [address] [name] [address] [phone] --  [facility]  [address] [name] [address] [phone] --  [facility]  [address] [name] [address] [phone] --  [facility]  [address], [name] [address] [phone] --  [facility]  [address], [name] [address] [phone] --  [facility]  [address] [name] [address] [phone] --  [facility]  [address] [name] [address] [phone] --  [facility]  [address], [name] [address] [phone] --  [facility]  [address] [name] [address] [phone]  [phone]    Situation ongoing,  CSW will follow up.   Maryjean Ka, MSW, [facility] 06/01/2021  @ 11:04 PM

## 2021-06-01 NOTE — ED Notes (Signed)
Pt requesting to shower. Pt given linens, clean scrubs, and soap and deodorant. Pt taking a shower at this time.

## 2021-06-01 NOTE — ED Notes (Addendum)
Pt speaking with mother on the phone, stating staff is messing with clocks. Pt informed mother that he is being discharged today, this RN informed pt that [facility] staff is recommending inpatient placement, pt states he does not want to receive inpatient care and that he knows his rights.

## 2021-06-01 NOTE — ED Notes (Signed)
Pt awake, requesting warm blanket and new scrub top. Pt provided with new scrub top and warm blanket.

## 2021-06-01 NOTE — ED Notes (Signed)
Pt making a third phone call, this RN informed pt that he could not make any more phone calls today. Pt ignored this RN and called 911, informed operator that there was drug activity and he was the only sober one in this place, asking operator to send someone to search the pts room. [employer] at bedside informing the pt that he cannot call 911 like that. Charge RN aware.

## 2021-06-01 NOTE — Progress Notes (Signed)
Patient has been faxed out per the request of [name], NP. Patient meets [facility] inpatient criteria per [name], NP. Patient has been faxed out to the following facilities:    [facility]  [address] [place] [address] [phone] [place]  [address], [place] [address] [phone] [place]  [address] [place] [address] [phone]  [place]  [address] [place] [address] [phone]  [place] [place] [address] [phone]  [facility]  [place], [place] [address] [place], [place] [address] [phone]  [facility]  [id number] [place] [address] [phone] [facility]  [address] [place] [address] [place]  [facility]  [address] [place] [address] [phone]  [facility]  [address], [place] [address] [phone] [place], MSW, LCSW-A  1:53 PM 06/01/2021

## 2021-06-02 ENCOUNTER — Encounter (HOSPITAL_COMMUNITY): Payer: Self-pay

## 2021-06-02 MED ORDER — STERILE WATER FOR INJECTION IJ SOLN
INTRAMUSCULAR | Status: AC
Start: 2021-06-02 — End: 2021-06-02
  Administered 2021-06-02: 1.2 mL
  Filled 2021-06-02: qty 10

## 2021-06-02 MED ORDER — OLANZAPINE 5 MG PO TBDP: 5.0000 mg | ORAL_TABLET | Freq: Once | ORAL | Status: DC

## 2021-06-02 MED ORDER — DIVALPROEX SODIUM ER 500 MG PO TB24: 500.0000 mg | ORAL_TABLET | Freq: Every day | ORAL | Status: DC

## 2021-06-02 MED ORDER — LORAZEPAM 1 MG PO TABS
1.0000 mg | ORAL_TABLET | Freq: Once | ORAL | Status: AC
  Administered 2021-06-03: 1 mg via ORAL
  Filled 2021-06-02: qty 1

## 2021-06-02 MED ORDER — GABAPENTIN 100 MG PO CAPS: 200.0000 mg | ORAL_CAPSULE | Freq: Two times a day (BID) | ORAL | Status: DC

## 2021-06-02 MED ORDER — GABAPENTIN 100 MG PO CAPS
200.0000 mg | ORAL_CAPSULE | Freq: Three times a day (TID) | ORAL | Status: DC
  Filled 2021-06-02: qty 2

## 2021-06-02 MED ORDER — OLANZAPINE 5 MG PO TBDP
5.0000 mg | ORAL_TABLET | Freq: Once | ORAL | Status: DC
  Filled 2021-06-02: qty 1

## 2021-06-02 NOTE — Progress Notes (Signed)
CSW followed-up with [facility] in reference to referral sent yesterday and request for additional information on this patient to be reviewed for possible placement. It was reported by [place], admissions, that the facility is waiting on a CBC, CMB,UDS, BAL, copy of IVC paperwork, and a COVID result that was completed within the last 24 hours for further review. [place] advised that fax number to send information to is fax#: [phone].   Crissie Reese, MSW, LCSW-A, LCAS-A Phone: [phone] Disposition/TOC

## 2021-06-02 NOTE — ED Notes (Signed)
Patient making first phone call of the day

## 2021-06-02 NOTE — ED Notes (Signed)
Dinner has arrived °

## 2021-06-02 NOTE — Progress Notes (Incomplete Revision)
CSW received a phone call from [facility] requesting the following:Treatment history, IV paper work, CBC, CMB, UDS, BAL. Fax to [phone]. 1 shift CSW to follow up.   Maryjean Ka, MSW, LCSWA 06/02/2021 2:05 AM

## 2021-06-02 NOTE — ED Notes (Addendum)
Called patient to desk to give him his medications. He is insisting that he is to get Clonopin, which he is not ordered. Stated he is not taking the vitamins. I opened the pack for the Nicotine Patch and he stated I had contaminated it, exposed it to the air and he would not take it.

## 2021-06-02 NOTE — ED Notes (Signed)
Patient on the phone with his mother asking her to visit. He states we are not giving him the correct medication. That we are giving him fake medications. Wants her to come and get him out of the hospital. Patient asked me to speak with his mother, and after I introduced myself, he took the phone away.

## 2021-06-02 NOTE — ED Provider Notes (Signed)
Emergency Medicine Observation Re-evaluation Note  Ronald [name] is a 41 y.o. male, seen on rounds today.  Pt initially presented to the ED for complaints of IVC Currently, the patient is resting in bed, prone position.  Physical Exam  BP 116/77    Pulse 79    Temp 98.7 F (37.1 C) (Oral)    Resp 16    Ht 5\' 11"  (1.803 m)    Wt 77.1 kg    SpO2 99%    BMI 23.71 kg/m  Physical Exam General: Awake, nondistressed Cardiac: Extremities well perfused Lungs: Breathing is unlabored Psych: No agitation  ED Course / MDM  EKG:   I have reviewed the labs performed to date as well as medications administered while in observation.  Recent changes in the last 24 hours include called 911 from [facility] following this morning.  Has paranoia about hospital medications.  Plan  Current plan is for patient psychiatric admission. [facility] is under involuntary commitment.      [name], MD 06/02/21 2042

## 2021-06-02 NOTE — ED Notes (Signed)
Lunch delivered. 

## 2021-06-02 NOTE — ED Notes (Signed)
Patient came to desk agiated and demanding his ativan;RN explained meds not due til 3am; pt states he will walk the halls til meds given; RN asked patient to wait in room for meds; pt refused; [employer] present; RN advised EDP of situation and new med orders given-Monique,RN

## 2021-06-02 NOTE — ED Notes (Signed)
Patient called 911 from [facility] phone. Patient has no more phone privileges

## 2021-06-02 NOTE — ED Notes (Signed)
Patient threw away his breakfast without consuming any of it

## 2021-06-02 NOTE — Progress Notes (Addendum)
CSW received a phone call from [facility] requesting the following:Treatment history, IV paper work, CBC, CMB, UDS, BAL. Fax to [phone]. 1 shift CSW to follow up. ° ° °Ronald [name] [name], MSW, LCSWA °06/02/2021 2:05 AM ° ° °

## 2021-06-02 NOTE — ED Notes (Signed)
Pt woke up requesting "something to drink and help with sleep". Sitter gave pt orange juice and milk per pt's request. Pt verbalized that "Ativan will work". This RN did a CIWA assessment. Pt requesting new scrub top d/t sweating through previous one. New scrub top given.

## 2021-06-03 ENCOUNTER — Emergency Department (HOSPITAL_COMMUNITY): Payer: Self-pay

## 2021-06-03 DIAGNOSIS — F3112 Bipolar disorder, current episode manic without psychotic features, moderate: Secondary | ICD-10-CM

## 2021-06-03 DIAGNOSIS — F1090 Alcohol use, unspecified, uncomplicated: Secondary | ICD-10-CM

## 2021-06-03 DIAGNOSIS — Z789 Other specified health status: Secondary | ICD-10-CM

## 2021-06-03 MED ORDER — MIDAZOLAM HCL 2 MG/2ML IJ SOLN
2.0000 mg | Freq: Once | INTRAMUSCULAR | Status: AC
Start: 2021-06-03 — End: 2021-06-03
  Administered 2021-06-03: 2 mg via INTRAMUSCULAR
  Filled 2021-06-03: qty 2

## 2021-06-03 MED ORDER — RISPERIDONE 1 MG PO TABS
1.0000 mg | ORAL_TABLET | Freq: Two times a day (BID) | ORAL | Status: DC
  Filled 2021-06-03 (×2): qty 1

## 2021-06-03 MED ORDER — BACITRACIN ZINC 500 UNIT/GM EX OINT
TOPICAL_OINTMENT | Freq: Two times a day (BID) | CUTANEOUS | Status: DC
  Filled 2021-06-03: qty 28.4

## 2021-06-03 MED ORDER — BACITRACIN-NEOMYCIN-POLYMYXIN 400-5-5000 EX OINT
TOPICAL_OINTMENT | Freq: Every day | CUTANEOUS | Status: AC
  Filled 2021-06-03: qty 1

## 2021-06-03 MED ORDER — TETANUS-DIPHTH-ACELL PERTUSSIS 5-2.5-18.5 LF-MCG/0.5 IM SUSY
0.5000 mL | PREFILLED_SYRINGE | Freq: Once | INTRAMUSCULAR | Status: DC
  Filled 2021-06-03: qty 0.5

## 2021-06-03 MED ORDER — ZIPRASIDONE MESYLATE 20 MG IM SOLR
20.0000 mg | Freq: Two times a day (BID) | INTRAMUSCULAR | Status: DC | PRN
  Filled 2021-06-03: qty 20

## 2021-06-03 MED ORDER — ZIPRASIDONE MESYLATE 20 MG IM SOLR
20.0000 mg | Freq: Once | INTRAMUSCULAR | Status: AC
Start: 2021-06-03 — End: 2021-06-03
  Administered 2021-06-03: 20 mg via INTRAMUSCULAR
  Filled 2021-06-03: qty 20

## 2021-06-03 MED ORDER — LORAZEPAM 1 MG PO TABS
0.0000 mg | ORAL_TABLET | Freq: Four times a day (QID) | ORAL | Status: DC
  Administered 2021-06-03 – 2021-06-04 (×3): 2 mg via ORAL
  Administered 2021-06-04: 1 mg via ORAL
  Administered 2021-06-04: 2 mg via ORAL
  Filled 2021-06-03: qty 1
  Filled 2021-06-03 (×4): qty 2
  Filled 2021-06-03 (×2): qty 1

## 2021-06-03 MED ORDER — LORAZEPAM 2 MG/ML IJ SOLN: 0.0000 mg | Freq: Four times a day (QID) | INTRAMUSCULAR | Status: DC

## 2021-06-03 NOTE — ED Notes (Signed)
Patient medicated as ordered per Dr. Doren Custard.

## 2021-06-03 NOTE — ED Notes (Signed)
Lunch delivered. 
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
## 2021-06-03 NOTE — ED Notes (Signed)
Pt woke up and asked for a shirt to change. Pt was drenched in sweat at this time. RN asked pt CIWA questions. Pt started getting irritated asking to speak to a doctor. Pt states, "I want to be discharged today. I did not do any crime. I am not suicidal. I feel good with taking my medications and I just want to go home." Safety precautions maintained. Will continue to monitor.
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
## 2021-06-03 NOTE — ED Notes (Signed)
Patient now laying in his bed, awaiting xrays.

## 2021-06-03 NOTE — ED Notes (Signed)
Pt is using his 2nd phone call at this time.

## 2021-06-03 NOTE — ED Notes (Signed)
X-ray at bedside

## 2021-06-03 NOTE — ED Provider Notes (Signed)
Emergency Medicine Observation Re-evaluation Note  Ronald [name] is a 41 y.o. male, seen on rounds today.  Pt initially presented to the ED for complaints of IVC Currently, the patient is speaking to his mother on the phone.  Physical Exam  BP (!) 168/106    Pulse 74    Temp 98.8 F (37.1 C)    Resp 18    Ht 5\' 11"  (1.803 m)    Wt 77.1 kg    SpO2 99%    BMI 23.71 kg/m  Physical Exam General: Alert, nondiaphoretic, nondistressed Cardiac: Extremities well perfused Lungs: Breathing is unlabored Psych: No agitation, not responding to internal stimuli  ED Course / MDM  EKG:   I have reviewed the labs performed to date as well as medications administered while in observation.  Recent changes in the last 24 hours include patient noted to have diaphoresis and elevated CIWA scores this morning.  As needed Ativan changed to every 6 hours.  Plan  Current plan is for inpatient psychiatric admission. Ronald [name] is under involuntary commitment.      Godfrey Pick, MD 06/03/21 1100

## 2021-06-03 NOTE — ED Notes (Signed)
Pt is up and ad lib. Pt is making his first phone call for the day at this time.
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
## 2021-06-03 NOTE — ED Notes (Signed)
Pt is very anxious and jittery at this time. Asked to call his mother twice. Pt left a voicemail at this time. Pt is requesting is Klonopin but it is not ordered. Provider notified. CIWA scale changed to q6 at this time. Pt is very sweaty and had drenched sweat to clothes upon waking up this AM. Pt ate breakfast. Requests multiple things constantly. Ativan 2mg  PO given for CIWA of 19. Refused all the other medications. Reports, "All the other meds cause adverse reaction so I cannot take it." Pt mother came to visit at this time. Will continue to monitor. Safety precautions maintained.

## 2021-06-03 NOTE — ED Notes (Signed)
Psych provider notified that pt mother is currently at bedside while pt talks to psych. Pt mom wants to talk to psych in private without the pt involved after TTS. Pt is coherent but is very anxious. Pt constantly demands multiple things all the time. Provider is aware. Will continue to monitor. Safety precautions maintained.

## 2021-06-03 NOTE — ED Notes (Addendum)
Patient refusing tetanus shot and for me to clean the wound.

## 2021-06-03 NOTE — Progress Notes (Signed)
Per Vivien Presto, patient meets criteria for inpatient treatment. There are no available or appropriate beds at [facility] today. CSW re-faxed referrals to the following facilities for review:          [facility]  Pending - Request Sent N/A [address] [name] [address] [phone] --  [facility]  Pending - Request Sent N/A [address] [name] [address] [phone] --  [facility]  Pending - Request Sent N/A [address] [name] [address] [phone] --          [facility]  Pending - Request Sent N/A [address] [place], [name] [address] [phone] --  [facility]  Pending - Request Sent N/A [address] [place], [name] [address] [phone] --          [facility]  Pending - Request Sent N/A [address] [name] [address] [phone] --  [facility]  Pending - Request Sent N/A [address], [name] [address] [phone] --          [facility]  Pending - Request Sent N/A [address] [name] [address] [phone] --  [facility]  Pending - Request Sent N/A [address] [name] [address] [phone] --  [facility]  Pending - Request Sent N/A [address], [name] [address] [phone] --  [facility]  Pending - Request Sent N/A [address] [name] [address] [phone] --        --  [facility]  Pending - No Request Sent N/A [address], [name] [address] [phone] --  [facility]  Pending - No Request Sent N/A [address] [name] [address]  [phone] --  [facility]  Pending - No Request Sent N/A [address] [name] [address] [phone] --  [facility]  Pending - No Request Sent N/A [address] [place], [name] [address] [phone] --  [facility]  Pending - No Request Sent N/A [address] [name] [address] [phone] --  [facility]  Pending - No Request Sent N/A [address] [name] [address] [phone] --   TTS will continue to seek bed placement.  Crissie Reese, MSW, LCSW-A, LCAS-A Phone: [phone] Disposition/TOC

## 2021-06-03 NOTE — ED Notes (Signed)
Patient calm and cooperative during medication pass.

## 2021-06-04 MED ORDER — STERILE WATER FOR INJECTION IJ SOLN
INTRAMUSCULAR | Status: AC
  Administered 2021-06-05: 1.2 mL
  Filled 2021-06-04: qty 10

## 2021-06-04 NOTE — ED Notes (Signed)
Pt using the phone currently. Pt speaking to his mother and aware he gets one more phone call.

## 2021-06-04 NOTE — ED Provider Notes (Signed)
Emergency Medicine Observation Re-evaluation Note  Ronald [name] is a 41 y.o. male, seen on rounds today.  Pt initially presented to the ED for complaints of IVC Currently, the patient is active speaking to nursing staff  Physical Exam  BP (!) 148/92 (BP Location: Left Arm)    Pulse 79    Temp 98.4 F (36.9 C)    Resp 18    Ht 5\' 11"  (1.803 m)    Wt 77.1 kg    SpO2 97%    BMI 23.71 kg/m  Physical Exam General: Alert, NAD Cardiac: Extremities well perfused Lungs: Breathing is unlabored Psych: Cooperative, no agitation  ED Course / MDM  EKG:   I have reviewed the labs performed to date as well as medications administered while in observation.  Recent changes in the last 24 hours include patient did try to run out of the ED yesterday. Received chemical sedation after. Appears cooperative now  Plan  Current plan is for inpatient psychiatric admission. Franki Monte is under involuntary commitment.       Regan Lemming, MD 06/04/21 [phone]

## 2021-06-04 NOTE — ED Notes (Addendum)
Since talking with the pt about rugby around 0830 today, pt has been calm and cooperative, mostly watching TV in the room. Pt played in college and in [place], mostly playing fly and scrumhalf. Pt also asked for, and provided, cola.

## 2021-06-04 NOTE — ED Notes (Signed)
Breakfast orders placed 

## 2021-06-04 NOTE — ED Notes (Signed)
Pt has made 2nd phone call of the day to his mother. He ended the call by hanging up on her. Pt aware he has had his 2 calls.

## 2021-06-04 NOTE — ED Notes (Signed)
Pt came to the desk calmly and started telling this RN about the supplements that he takes normally- including carbon 60, vitamin c, vitamin d, n acetylcholine, glutathione, valerian root

## 2021-06-04 NOTE — ED Notes (Signed)
Pt informed that he needed a COVID swab and EKG . Pt states that he never had COVID, thus does not need the swab. Pt informed that this is not how COVID works. Pt became combative and stated that he would not do it. Pt informed that it was non negotiable and needed. Pt states that he will not cooperate with anything until he sees his legal counsel.
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
## 2021-06-04 NOTE — ED Notes (Signed)
Pt up to restroom.

## 2021-06-04 NOTE — ED Notes (Signed)
Pt called his mother and hung up on her when she told him he needed to listen to what staff told him to do, and to take his meds. Mother states that when pt is acting up to try talking to him about law school, college, or rugby.

## 2021-06-04 NOTE — ED Notes (Addendum)
Provided pt's mother update at pt's request. Pt appreciative. Mother plans to visit tomorrow.

## 2021-06-04 NOTE — ED Notes (Signed)
Pt became agitated when Latoya NT took vitals and his BP was elevated. 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 Pt states that the machine is rigged and can be hacked. Pt states he has also taken classes on computers and hacking.

## 2021-06-04 NOTE — ED Notes (Signed)
Pt threw out dinner tray without eating any of it. Pt has been calm and cooperative nearly the entire day with this RN (only exception being early in the day with Alyssa RN and with BP issue with Latoya NT- which only lasted 2 mins and pt went back to his room and deescalated himself and has been compliant with rules.  Pt spent most of the day watching TV. Pt walked around the nurses station a few times, but was not disruptive, merely stretching his legs. Pt was also compliant with wearing mask throughout day when out of the room.

## 2021-06-04 NOTE — ED Notes (Signed)
Pt handed new scrubs, shampoo, and linen. Currently, pt is taking a shower.

## 2021-06-04 NOTE — ED Notes (Signed)
Pt provided cola and milk.

## 2021-06-04 NOTE — ED Notes (Signed)
Pt refusing risperdal, states he is allergic.  Pt agreeable to wearing mask as COVID swab has been refused.

## 2021-06-04 NOTE — ED Notes (Signed)
Pt provided soda.

## 2021-06-05 MED ORDER — ZIPRASIDONE HCL 20 MG PO CAPS
20.0000 mg | ORAL_CAPSULE | Freq: Two times a day (BID) | ORAL | Status: DC
  Administered 2021-06-05 – 2021-06-06 (×2): 20 mg via ORAL
  Filled 2021-06-05 (×2): qty 1

## 2021-06-05 MED ORDER — STERILE WATER FOR INJECTION IJ SOLN
INTRAMUSCULAR | Status: AC
  Filled 2021-06-05: qty 10

## 2021-06-05 MED ORDER — ZIPRASIDONE MESYLATE 20 MG IM SOLR
20.0000 mg | Freq: Two times a day (BID) | INTRAMUSCULAR | Status: DC
  Filled 2021-06-05: qty 20

## 2021-06-05 NOTE — Progress Notes (Signed)
Inpatient Behavioral Health Placement  Pt meets inpatient criteria per [facility]. There are no appropriate beds at [facility] per [facility] Fransico Michael, RN.  Referral was sent to the following facilities;   Destination Service Provider Address Phone Fax  [facility]  [address] [name] [address] [phone]  [facility]  [address] [name] [address] [phone]  [facility]  [address], [name] [address] [phone]  [facility]  [address], [name] [address] [phone]  [facility]  [address] [name] [address] [phone]  [facility]  [address] [place], [name] [address] [phone]  [facility]  [address] [place], [name] [address] [phone]  [facility]  [address] [name] [address] [phone]  [facility]  [address] [name] [address] [phone]  [facility]  [address], [name] [address] [phone]  [facility]  [address] [name] [address] [phone]  [facility]  [address], [name] [address] [phone]  [facility]  [address], [name] [address] [phone]  [facility]  [address] [place], [name] [address] [phone]  [facility]  [address] [name] [address] [phone]  [facility]  [address] [name] [address] [phone]  [facility]  [address] [place]  [name] [address] [phone]  [facility]  [address], [name] [address] [phone]  [facility]  [address] [name] [address] [phone]  [facility]  [address] [place], [name] [address] [phone]  [facility]  [address] [name] [address] [phone]  [facility]  [address] [name] [address] [phone]  [facility]  [address] [name] [address] [phone]  [facility] [address]  [address], [name] [address] [phone]  [facility]  [address] [place] [name] [address] [phone]  [facility]  [address] [name] [address] [phone]  [facility]  [address] [name] [address] [phone]    Situation ongoing,  CSW will follow up.   Maryjean Ka, MSW, [facility] 06/05/2021  @ 10:08 PM
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
## 2021-06-05 NOTE — ED Notes (Signed)
Dr. Reather Converse in to speak with patient about his medications. States he would have psychiatry look over his medications but he wasn't witting for any medications at this time. Security standing by, one officer speaking with patient, which seems to help calm him down.

## 2021-06-05 NOTE — ED Notes (Signed)
Mother at bedside, patient arguing at her that he needs to leave. That he needs to have legal representation here.

## 2021-06-05 NOTE — Consult Note (Addendum)
Patient with bipolar mood disorder current manic episode.  He is currently under IVC, was evaluated by psychiatry and recommended for inpatient admission on 06/03/2021.  Patient has known allergies to hydroxyzine, benadryl, all antihistamines and pcn.  Previously discussed starting antipsychotic medications for mood stabilization but patient does not believe he has a psychiatric concerns and has refused most po meds offered with the exception of ativan and prn trazodone.  Refused olanzapine, risperidone, seroquel and citing allergies, although patient has received these medications on prior admission, no concerns documented.  Per nursing notes, he continues to be manic, at time redirectable, although behavior improved yesterday, and has required prn doses of geodon 20mg  IM for agitation. His sleep is fair which is not helping his manic symptoms.  Appetite is okay.    Recommend the following: Complete EKG Based on results will plan to give Geodon 20mg  po BID with meals; can do Geodon 20mg  IM with meals to promote mood stability is he refuses po medications.

## 2021-06-05 NOTE — ED Notes (Signed)
After extensive conversation, patient refuses Geodon, claims he is allergic to the medication.

## 2021-06-05 NOTE — ED Notes (Signed)
Attempted to encourage patient to get EKG and COVID swab done. Informed him that his placement would not move forward without them. Patient laying in recliner with sheet over his head, refusing to respond.

## 2021-06-05 NOTE — ED Provider Notes (Signed)
Emergency Medicine Observation Re-evaluation Note  Ronald [name] is a 41 y.o. male, seen on rounds today.  Pt initially presented to the ED for complaints of IVC Currently, the patient is awaiting placement for manic behavior IVC.Marland Kitchen  Physical Exam  BP (!) 147/100    Pulse (!) 103    Temp 98.7 F (37.1 C) (Oral)    Resp 15    Ht 5\' 11"  (1.803 m)    Wt 77.1 kg    SpO2 100%    BMI 23.71 kg/m  Physical Exam General: Tearful intermittent with anxiety Cardiac: Mild tachycardia Lungs: Normal work of breathing Psych: Patient mood swings between agitated, tearful, requesting benzos, comments about family members deaths, repeating himself  ED Course / MDM  EKG:   I have reviewed the labs performed to date as well as medications administered while in observation.  Recent changes in the last 24 hours include patient frustrated he is not receiving benzodiazepines.  No signs of significant withdrawal at this time.  Discussed importance of medication management with psychiatry.  Reviewed medical records patient refusing other mood medications.  Patient will only accept Ativan, trazodone and nicotine patch.  Psychiatry medication review ordered.  Security at bedside..  Plan  Current plan is for continue IVC, psychiatry consult for medication management. is under involuntary commitment.      Ronald Guadalajara, MD 06/05/21 [phone]

## 2021-06-05 NOTE — ED Notes (Signed)
After speaking with Martie Lee, ED Director, patient amendable to COVID swab and EKG. Patient still under the belief he is being discharged today, despite being advised otherwise.

## 2021-06-05 NOTE — Consult Note (Signed)
Brief Psychiatry Consult Note  The inpatient psychiatry consult order is only for medically admitted patients. Have redirected this consult to the TTS service who sees patients in the emergency room.   We will sign off at this time. This has been communicated to the primary team. If patient requires medical admission in the future, don't hesitate to reconsult the Psychiatry Inpatient Consult Service.   Ronald [name] A Iliya Spivack

## 2021-06-05 NOTE — ED Notes (Signed)
RN asked patient if he would like PRN Trazodone; Patient states he is good and on coming sitter introduced self and staff closed patient door-Monique,RN

## 2021-06-06 LAB — RESP PANEL BY RT-PCR (FLU A&B, COVID) ARPGX2
Influenza A by PCR: NEGATIVE
Influenza B by PCR: NEGATIVE
SARS Coronavirus 2 by RT PCR: NEGATIVE

## 2021-06-06 MED ORDER — LORAZEPAM 1 MG PO TABS
1.0000 mg | ORAL_TABLET | Freq: Once | ORAL | Status: AC
  Administered 2021-06-06: 1 mg via ORAL
  Filled 2021-06-06: qty 1

## 2021-06-06 NOTE — ED Notes (Signed)
Ivc papers faxed to [facility] . Confirmation received.

## 2021-06-06 NOTE — ED Notes (Signed)
Patient walkes out of room demanding milk and oj; pt offered watered and advised of snack hours; pt continued to walk the unit and addressing other patients in unit demanding to see the "policy book"; Pt provided the policy sheet with snack times and visitor times; Pt redirected back to room-Monique,RN

## 2021-06-06 NOTE — ED Notes (Signed)
[name] mother [phone] requesting nurse call her back, states its something regarding [facility]

## 2021-06-06 NOTE — ED Notes (Signed)
Pt in shower but refused to wait for shower items. Pt coming out of shower room with blanket to get alcohol hand sanitizer to bath with . Pt has been provided with shower supplies. Pt still refusing vital signs.

## 2021-06-06 NOTE — ED Notes (Signed)
This Clinical research associate handed COVID sample to [employer].

## 2021-06-06 NOTE — ED Notes (Signed)
Pt requesting nicotine patch because first patch fell off.

## 2021-06-06 NOTE — ED Notes (Signed)
REPORT given to this writer that COVID test collected on Monday was lost . Previous staff have not informed Pt he will need a repeat COVID test before trans port to [facility].

## 2021-06-06 NOTE — ED Provider Notes (Signed)
Emergency Medicine Observation Re-evaluation Note  TOBIAS AVITABILE is a 41 y.o. male, seen on rounds today.  Pt initially presented to the ED for complaints of IVC Currently, the patient is calm.  He is very paranoid per nursing staff.    Physical Exam  BP (!) 147/100    Pulse (!) 103    Temp 98.7 F (37.1 C) (Oral)    Resp 15    Ht 5\' 11"  (1.803 m)    Wt 77.1 kg    SpO2 100%    BMI 23.71 kg/m  Physical Exam General: calm Cardiac: rrr Lungs: cta b Psych: calm  ED Course / MDM  EKG:   I have reviewed the labs performed to date as well as medications administered while in observation.  Recent changes in the last 24 hours include none.  Plan  Current plan is for awaiting inpatient placement. is under involuntary commitment.      [name], MD 06/06/21 [phone]

## 2021-06-06 NOTE — Progress Notes (Addendum)
[facility] LCSW Progress Note   06/06/2021    11:19 AM  ZEPHYR RIDLEY   [id number]   Type of Contact and Topic:  Psychiatric Bed Placement   [facility]     Patient meets inpatient criteria per Ophelia Shoulder, NP   The attending provider will be Estill Cotta, MD   Call report to [phone] or [phone]  Celine Ahr, RN @ [facility] ED notified.     [facility]. Please fax IVC paperwork prior to transporting this Pt.    Damita Dunnings, MSW, LCSW-A  11:21 AM 06/06/2021

## 2021-06-06 NOTE — Progress Notes (Signed)
Patient has been faxed out due to no appropriate bed available. Patient meets [place] inpatient criteria Merlyn Lot, NP. Patient has been faxed out to the following facilities:    [facility]  [address] [place] [address] [phone] [place]  [address] [place] [address] [phone] [place]  [address], [place] [address] [phone]  [place]  [address], [place] [address] [phone]  [address] [place] [address] [phone]  [facility]  [place], [place] [address] [address] [place], [place] [address] [phone]  [facility]  [id number] [place] [address] [phone] [facility]  [address] [place] [address] [phone] [facility]  [address] [place] [address] [phone]  [facility]  [address] [place] [address] [phone]  [facility]  [address], [place] [address] [phone]  [facility]  [address] [place] [address] [phone]  [facility]  [place], [place] [address] [phone]  [facility]  [address], [place] [address] [phone] [facility]  [address] [place] [address] [phone] [facility]  [address] [place] [address] [phone] [address] [place] [address] [phone]  [facility]  [address] [place] [address] [phone]  [facility]  [address] [place], [place] [address] [phone]  [facility]  [address] [place] [address] [facility]  [address] [place] [address] [phone] [facility]  [address], [place] [address] [id number] [place], [place] [phone]  [address] [place] [address] [place]  [facility]  [address], [place] [address] [phone]  [facility]  [address], [place] [address] [phone] [place], MSW, LCSW-A  10:13 AM 06/06/2021

## 2021-06-06 NOTE — ED Notes (Signed)
Pt walked to BR

## 2021-06-06 NOTE — ED Notes (Signed)
Pt making Phone call to mother

## 2021-06-06 NOTE — ED Notes (Signed)
IVC renewal completed and passed off to [employer]

## 2021-06-06 NOTE — ED Notes (Signed)
Pt manic and delusional refusing Vitals  because he thinks BP monitor is connected to internet.

## 2021-06-06 NOTE — ED Notes (Signed)
This Clinical research associate collected 2nd COVID test because Test collected at approx. 1400 yesterday was lost

## 2021-06-06 NOTE — ED Notes (Signed)
Pt will go to [facility] on W[facility]  °

## 2021-06-06 NOTE — ED Notes (Signed)
Pt out in hall walking

## 2021-06-07 MED ORDER — LORAZEPAM 1 MG PO TABS
1.0000 mg | ORAL_TABLET | Freq: Once | ORAL | Status: AC
  Administered 2021-06-07: 1 mg via ORAL
  Filled 2021-06-07: qty 1

## 2021-06-07 NOTE — ED Provider Notes (Signed)
Emergency Medicine Observation Re-evaluation Note  Ronald [name] is a 41 y.o. male, seen on rounds today.  Pt initially presented to the ED for complaints of IVC Currently, the patient is awake and alert sitting upright, requesting medication for PTSD anxiety.  Physical Exam  BP 126/77 (BP Location: Right Arm)    Pulse (!) 51    Temp 99.2 F (37.3 C)    Resp 15    Ht 5\' 11"  (1.803 m)    Wt 77.1 kg    SpO2 99%    BMI 23.71 kg/m  Physical Exam General: No distress Cardiac: Regular rate and rhythm Lungs: Increased work of breathing Psych: Awake and alert insightful, ready for transfer  ED Course / MDM  EKG:   I have reviewed the labs performed to date as well as medications administered while in observation.  Recent changes in the last 24 hours include none.  Plan  Current plan is for discharge today to [facility] facility.  Patient is aware of transfer, Dr. Selinda Flavin is accepting physician Ronald [name] is under involuntary commitment.      Carmin Muskrat, MD 06/07/21 [phone]

## 2021-06-07 NOTE — ED Notes (Signed)
PT Mom demanding a call back

## 2021-08-06 ENCOUNTER — Other Ambulatory Visit: Payer: Self-pay

## 2021-08-06 DIAGNOSIS — G479 Sleep disorder, unspecified: Secondary | ICD-10-CM

## 2021-08-06 NOTE — Telephone Encounter (Signed)
MEDICATION: traZODone (DESYREL) 50 MG tablet ? ?PHARMACY: [facility] [address] [place], [name] - [address] ? ?Comments:  ? ?**Let patient know to contact pharmacy at the end of the day to make sure medication is ready. ** ? ?** Please notify patient to allow 48-72 hours to process** ? ?**Encourage patient to contact the pharmacy for refills or they can request refills through [facility]** ? ? ?

## 2021-08-07 MED ORDER — TRAZODONE HCL 50 MG PO TABS: 25.0000 mg | ORAL_TABLET | Freq: Every evening | ORAL | 3 refills | Status: DC | PRN

## 2021-08-07 NOTE — Telephone Encounter (Signed)
Patient is requesting a refill of the following medications: ?Requested Prescriptions  ? ?Pending Prescriptions Disp Refills  ? traZODone (DESYREL) 50 MG tablet 90 tablet 3  ?  Sig: Take 0.5-1 tablets (25-50 mg total) by mouth at bedtime as needed for sleep.  ? ? ?Date of patient request: 08/07/21 ?Last office visit: 03/21/21 ?Date of last refill: 10/31/20 ?Last refill amount: 90 ? ?

## 2021-08-13 ENCOUNTER — Telehealth: Payer: Self-pay

## 2021-08-13 ENCOUNTER — Other Ambulatory Visit: Payer: Self-pay | Admitting: Registered Nurse

## 2021-08-13 ENCOUNTER — Emergency Department (HOSPITAL_COMMUNITY)
Admission: EM | Admit: 2021-08-13 | Discharge: 2021-08-14 | Disposition: A | Discharge status: Other Acute Inpatient Hospital | Payer: Self-pay | Attending: Emergency Medicine | Admitting: Emergency Medicine

## 2021-08-13 ENCOUNTER — Encounter (HOSPITAL_COMMUNITY): Payer: Self-pay

## 2021-08-13 DIAGNOSIS — Z20822 Contact with and (suspected) exposure to covid-19: Secondary | ICD-10-CM | POA: Insufficient documentation

## 2021-08-13 DIAGNOSIS — R440 Auditory hallucinations: Secondary | ICD-10-CM | POA: Insufficient documentation

## 2021-08-13 DIAGNOSIS — F312 Bipolar disorder, current episode manic severe with psychotic features: Secondary | ICD-10-CM | POA: Diagnosis present

## 2021-08-13 DIAGNOSIS — F1910 Other psychoactive substance abuse, uncomplicated: Secondary | ICD-10-CM | POA: Insufficient documentation

## 2021-08-13 DIAGNOSIS — R45851 Suicidal ideations: Secondary | ICD-10-CM | POA: Insufficient documentation

## 2021-08-13 LAB — COMPREHENSIVE METABOLIC PANEL
ALT: 21 U/L (ref 0–44)
AST: 17 U/L (ref 15–41)
Albumin: 4.4 g/dL (ref 3.5–5.0)
Alkaline Phosphatase: 54 U/L (ref 38–126)
Anion gap: 9 (ref 5–15)
BUN: 14 mg/dL (ref 6–20)
CO2: 27 mmol/L (ref 22–32)
Calcium: 8.5 mg/dL — ABNORMAL LOW (ref 8.9–10.3)
Chloride: 104 mmol/L (ref 98–111)
Creatinine, Ser: 0.89 mg/dL (ref 0.61–1.24)
GFR, Estimated: >60 mL/min (ref >60)
Glucose, Bld: 134 mg/dL — ABNORMAL HIGH (ref 70–99)
Potassium: 3.9 mmol/L (ref 3.5–5.1)
Sodium: 140 mmol/L (ref 135–145)
Total Bilirubin: 0.6 mg/dL (ref 0.3–1.2)
Total Protein: 7.3 g/dL (ref 6.5–8.1)

## 2021-08-13 LAB — CBC WITH DIFFERENTIAL/PLATELET
Abs Immature Granulocytes: 0.01 10*3/uL (ref 0.00–0.07)
Basophils Absolute: 0 10*3/uL (ref 0.0–0.1)
Basophils Relative: 0 %
Eosinophils Absolute: 0 10*3/uL (ref 0.0–0.5)
Eosinophils Relative: 0 %
HCT: 44 % (ref 39.0–52.0)
Hemoglobin: 15.1 g/dL (ref 13.0–17.0)
Immature Granulocytes: 0 %
Lymphocytes Relative: 23 %
Lymphs Abs: 1.3 10*3/uL (ref 0.7–4.0)
MCH: 30.3 pg (ref 26.0–34.0)
MCHC: 34.3 g/dL (ref 30.0–36.0)
MCV: 88.2 fL (ref 80.0–100.0)
Monocytes Absolute: 0.3 10*3/uL (ref 0.1–1.0)
Monocytes Relative: 5 %
Neutro Abs: 4.2 10*3/uL (ref 1.7–7.7)
Neutrophils Relative %: 72 %
Platelets: 254 10*3/uL (ref 150–400)
RBC: 4.99 MIL/uL (ref 4.22–5.81)
RDW: 13.1 % (ref 11.5–15.5)
WBC: 5.8 10*3/uL (ref 4.0–10.5)
nRBC: 0 % (ref 0.0–0.2)

## 2021-08-13 LAB — RESP PANEL BY RT-PCR (FLU A&B, COVID) ARPGX2
Influenza A by PCR: NEGATIVE
Influenza B by PCR: NEGATIVE
SARS Coronavirus 2 by RT PCR: NEGATIVE

## 2021-08-13 LAB — RAPID URINE DRUG SCREEN, HOSP PERFORMED
Amphetamines: NOT DETECTED
Barbiturates: NOT DETECTED
Benzodiazepines: NOT DETECTED
Cocaine: NOT DETECTED
Opiates: NOT DETECTED
Tetrahydrocannabinol: POSITIVE — AB

## 2021-08-13 LAB — ETHANOL: Alcohol, Ethyl (B): 215 mg/dL — ABNORMAL HIGH (ref <10)

## 2021-08-13 MED ORDER — LORAZEPAM 2 MG/ML IJ SOLN: 0.0000 mg | Freq: Four times a day (QID) | INTRAMUSCULAR | Status: DC

## 2021-08-13 MED ORDER — THIAMINE HCL 100 MG/ML IJ SOLN: 100.0000 mg | Freq: Every day | INTRAMUSCULAR | Status: DC

## 2021-08-13 MED ORDER — LORAZEPAM 1 MG PO TABS
0.0000 mg | ORAL_TABLET | Freq: Four times a day (QID) | ORAL | Status: DC
  Administered 2021-08-13: 1 mg via ORAL
  Filled 2021-08-13: qty 1

## 2021-08-13 MED ORDER — OLANZAPINE 5 MG PO TBDP
5.0000 mg | ORAL_TABLET | Freq: Every day | ORAL | Status: DC
  Administered 2021-08-14: 5 mg via ORAL
  Filled 2021-08-13: qty 1

## 2021-08-13 MED ORDER — OLANZAPINE 10 MG PO TABS: 10.0000 mg | ORAL_TABLET | Freq: Every day | ORAL | Status: DC

## 2021-08-13 MED ORDER — CLONAZEPAM 1 MG PO TABS
1.0000 mg | ORAL_TABLET | Freq: Two times a day (BID) | ORAL | Status: DC
  Administered 2021-08-13 – 2021-08-14 (×3): 1 mg via ORAL
  Filled 2021-08-13 (×3): qty 1

## 2021-08-13 MED ORDER — THIAMINE HCL 100 MG PO TABS
100.0000 mg | ORAL_TABLET | Freq: Every day | ORAL | Status: DC
  Administered 2021-08-13 – 2021-08-14 (×2): 100 mg via ORAL
  Filled 2021-08-13 (×2): qty 1

## 2021-08-13 MED ORDER — OLANZAPINE 10 MG PO TABS
10.0000 mg | ORAL_TABLET | Freq: Every day | ORAL | Status: DC
  Administered 2021-08-13: 10 mg via ORAL
  Filled 2021-08-13: qty 1

## 2021-08-13 MED ORDER — LORAZEPAM 2 MG/ML IJ SOLN: 0.0000 mg | Freq: Two times a day (BID) | INTRAMUSCULAR | Status: DC

## 2021-08-13 MED ORDER — TRAZODONE HCL 50 MG PO TABS
50.0000 mg | ORAL_TABLET | Freq: Every day | ORAL | Status: DC
  Administered 2021-08-13: 50 mg via ORAL
  Filled 2021-08-13: qty 1

## 2021-08-13 MED ORDER — LORAZEPAM 1 MG PO TABS: 0.0000 mg | ORAL_TABLET | Freq: Two times a day (BID) | ORAL | Status: DC

## 2021-08-13 MED ORDER — NICOTINE 21 MG/24HR TD PT24
21.0000 mg | MEDICATED_PATCH | Freq: Every day | TRANSDERMAL | Status: DC
  Administered 2021-08-13 – 2021-08-14 (×2): 21 mg via TRANSDERMAL
  Filled 2021-08-13 (×2): qty 1

## 2021-08-13 NOTE — ED Triage Notes (Signed)
Pt called PCP stating he is suicidal, PCP called  [facility] to bring to ED. PCP states he needs to be IVC'd,  pt admits to feeling SI due to being out of his Psych meds. He is willing to stay as voluntary  ?

## 2021-08-13 NOTE — Consult Note (Signed)
[facility] Face-to-Face Psychiatry Consult  ? ?Reason for Consult:  Psychiatric evaluation ?Referring Physician:  ER Physician ?Patient Identification: Ronald [name] ?MRN:  [id number] ?Principal Diagnosis: Severe bipolar I disorder, recurrent manic episode, with psychotic behavior (HCC) ?Diagnosis:  Principal Problem: ?  Severe bipolar I disorder, recurrent manic episode, with psychotic behavior (HCC) ? ? ?Total Time spent with patient: 45 minutes ? ?Subjective:   ?Ronald [name] is a 41 y.o. male patient admitted with PTSD, schizophrenia dx, polysubstance use disorder, and Bipolar disorder brought in by [facility] Department for endorsing suicide and auditory hallucination. ? ?HPI:  Patient was seen by this provider in his ER where he was resting.  His speech was rapid, pressured and he was delusional telling provider he has done much research about medications that can cure Cancer and has done a lot of studies about medications.  Patient reported that he was brought in because he has been hearing mumbling voices for two weeks because he did not have his Olanzapine to take.  He also reported that he has been drinking few glasses of wine to get rid of the AH.  His alcohol level on arrival to the ER was 215.  Patient denied any illicit drug in his system but was reluctant to give urine for UDS.  Patient also admitted he was suicidal this morning due to [facility] and was angry the voices would not go away.  He stated that as soon as he took his Olanzapine the voices went away. Patient reported he could not get his Olanzapine refilled because he had no refills.  He also admitted that he did not tell his outpatient provider that he was started on Olanzapine at [facility] in [place] because he was ashamed of being labelled Schizophrenic.  Patient reported only one inpatient Hospitalization to [facility] but did not mention other inpatient hospitalization.  He admitted insomnia because he did not have his Olanzapine.  He also hopes to  get back on Klonopin and was informed by this provider that Klonopin is addictive and may not be prescribed.  He became argumentative to justify getting Klonopin.  He reported that his research did not show that Klonopin is more addictive than Cigarettes or Alcohol.   He does not have an outpatient Psychiatrist and is willing to do so now.  He allowed this provider speak to his mom. ?Collateral information from mom, [name] is that patient has been making gestures of wanting to kill himself by holding his hands across his neck.  He has been threatening to kill himself by using knife.   He has screaming in the house stating the voices are telling him to kill himself. He has told multiple people that he is suicidal and want to die.  Patient was hospitalized in March at [facility] under IVC for hearing voices and wanting to kill himself.  He has been hospitalized at [facility], [facility] and [facility].  His psychiatric hospitalization goes back as far as 2012.  Mother reported that patient continues to use CBD and smoking Marijuana and his UDS is positive for Marijuana.  His mother reported his abuse and addiction to Klonopin and one time he stole a bottle of Klonopin from her. ?We have resumed home medications, will seek inpatient mental health hospitalization for safety and stabilization. ? ?Past Psychiatric History: Patient has had multiple psychiatric hospitalizations -he has been hospitalized at [facility] and [facility], [facility] and [facility] in March.  His diagnosis includes Bipolar 1 disorder,  Schizophrenia,Paranoid type,  polysubstance abuse, and PTSD ? ?Risk to Self:   ?Risk to Others:   ?Prior Inpatient Therapy:   ?Prior Outpatient Therapy:   ? ?Past Medical History:  ?Past Medical History:  ?Diagnosis Date  ? Anxiety   ? Arthritis   ? Disorder of pineal gland   ? Post traumatic stress disorder   ?  ?Past Surgical History:  ?Procedure Laterality Date  ? ANKLE SURGERY    ? ?Family History: History reviewed. No pertinent family  history. ?Family Psychiatric  History: Mother-Depression after son was murdered, Copywriter, advertising, Deceased brother was an addict as well. ?Social History:  ?Social History  ? ?Substance and Sexual Activity  ?Alcohol Use No  ?   ?Social History  ? ?Substance and Sexual Activity  ?Drug Use Yes  ? Types: Amphetamines, Benzodiazepines, Cocaine, Marijuana, Methamphetamines  ? Comment: one year ago  ?  ?Social History  ? ?Socioeconomic History  ? Marital status: Single  ?  Spouse name: Not on file  ? Number of children: Not on file  ? Years of education: Not on file  ? Highest education level: Not on file  ?Occupational History  ? Not on file  ?Tobacco Use  ? Smoking status: Never  ? Smokeless tobacco: Current  ?  Types: Chew  ?Vaping Use  ? Vaping Use: Never used  ?Substance and Sexual Activity  ? Alcohol use: No  ? Drug use: Yes  ?  Types: Amphetamines, Benzodiazepines, Cocaine, Marijuana, Methamphetamines  ?  Comment: one year ago  ? Sexual activity: Never  ?Other Topics Concern  ? Not on file  ?Social History Narrative  ? Not on file  ? ?Social Determinants of Health  ? ?Financial Resource Strain: Not on file  ?Food Insecurity: Not on file  ?Transportation Needs: Not on file  ?Physical Activity: Not on file  ?Stress: Not on file  ?Social Connections: Not on file  ? ?Additional Social History: ?  ? ?Allergies:   ?Allergies  ?Allergen Reactions  ? Hydroxyzine Anxiety  ?  Other reaction(s): Other ?"hyper "  ? Benadryl [Diphenhydramine] Other (See Comments)  ?  Pt states that this makes him feel insane  ? Other Other (See Comments)  ?  Pt reports that all antihistamines make him feel insane  ? Penicillins Other (See Comments)  ?  Pt states that this made him feel insane  ? ? ?Labs:  ?Results for orders placed or performed during the hospital encounter of 08/13/21 (from the past 48 hour(s))  ?Comprehensive metabolic panel     Status: Abnormal  ? Collection Time: 08/13/21 12:49 PM  ?Result Value Ref Range  ? Sodium 140 135  - 145 mmol/L  ? Potassium 3.9 3.5 - 5.1 mmol/L  ? Chloride 104 98 - 111 mmol/L  ? CO2 27 22 - 32 mmol/L  ? Glucose, Bld 134 (H) 70 - 99 mg/dL  ?  Comment: Glucose reference range applies only to samples taken after fasting for at least 8 hours.  ? BUN 14 6 - 20 mg/dL  ? Creatinine, Ser 0.89 0.61 - 1.24 mg/dL  ? Calcium 8.5 (L) 8.9 - 10.3 mg/dL  ? Total Protein 7.3 6.5 - 8.1 g/dL  ? Albumin 4.4 3.5 - 5.0 g/dL  ? AST 17 15 - 41 U/L  ? ALT 21 0 - 44 U/L  ? Alkaline Phosphatase 54 38 - 126 U/L  ? Total Bilirubin 0.6 0.3 - 1.2 mg/dL  ? GFR, Estimated >60 >60 mL/min  ?  Comment: (NOTE) ?  Calculated using the CKD-EPI Creatinine Equation (2021) ?  ? Anion gap 9 5 - 15  ?  Comment: Performed at [facility], [address] [place], [place] [address]  ?Ethanol     Status: Abnormal  ? Collection Time: 08/13/21 12:49 PM  ?Result Value Ref Range  ? Alcohol, Ethyl (B) 215 (H) <10 mg/dL  ?  Comment: (NOTE) ?Lowest detectable limit for serum alcohol is 10 mg/dL. ? ?For medical purposes only. ?Performed at [facility], [address] ?[place], [place] [address] ?  ?CBC with Diff     Status: None  ? Collection Time: 08/13/21 12:49 PM  ?Result Value Ref Range  ? WBC 5.8 4.0 - 10.5 K/uL  ? RBC 4.99 4.22 - 5.81 MIL/uL  ? Hemoglobin 15.1 13.0 - 17.0 g/dL  ? HCT 44.0 39.0 - 52.0 %  ? MCV 88.2 80.0 - 100.0 fL  ? MCH 30.3 26.0 - 34.0 pg  ? MCHC 34.3 30.0 - 36.0 g/dL  ? RDW 13.1 11.5 - 15.5 %  ? Platelets 254 150 - 400 K/uL  ? nRBC 0.0 0.0 - 0.2 %  ? Neutrophils Relative % 72 %  ? Neutro Abs 4.2 1.7 - 7.7 K/uL  ? Lymphocytes Relative 23 %  ? Lymphs Abs 1.3 0.7 - 4.0 K/uL  ? Monocytes Relative 5 %  ? Monocytes Absolute 0.3 0.1 - 1.0 K/uL  ? Eosinophils Relative 0 %  ? Eosinophils Absolute 0.0 0.0 - 0.5 K/uL  ? Basophils Relative 0 %  ? Basophils Absolute 0.0 0.0 - 0.1 K/uL  ? Immature Granulocytes 0 %  ? Abs Immature Granulocytes 0.01 0.00 - 0.07 K/uL  ?  Comment: Performed at [facility], [address] [place], [place] [address]  ?Resp Panel by RT-PCR (Flu A&B, Covid) Nasopharyngeal Swab     Status: None  ? Collection Time: 08/13/21  2:12 PM  ? Specimen: Nasopharyngeal Swab; Nasopharyngeal

## 2021-08-13 NOTE — Progress Notes (Signed)
Pt called endorsing active SI with plan and intent if he did not get refill. ?Call taken by [facility].  ? ?He has been on abilify and zyprexa, as well as trazodone. He has run out of these after taking more than prescribed amount due to subtherapeutic control of his paranoid schizophrenia.  ? ?He does have access to knives but no firearms in the house. ? ?Called 911. Was able to stay on phone with patient until officers responded. ? ?Kathrin Ruddy, NP ?

## 2021-08-13 NOTE — Telephone Encounter (Signed)
Patient called in stating he was needing a prescription refill on a medication prescribed in the hospital. Advised patient it would probably be best to bring him in for an appointment, to which patient started getting very frantic. Ronald [name] was saying that if I can not send in this medication right now that he was going to shoot him self while on the phone with me. Patient kept repeating that he was hearing voices in his head, this medication was going to help him and that he has been without the medication for a couple of days. Asked patient to hold for a moment while I go grab the nurse. Spoke with Ronald [name] who said he would like to talk with patient, so I transferred the call to him.  ?

## 2021-08-13 NOTE — ED Provider Notes (Signed)
?[facility]-EMERGENCY DEPT ?Provider Note ? ? ?CSN: [id number] ?Arrival date & time: 08/13/21  1103 ? ?  ? ?History ? ?Chief Complaint  ?Patient presents with  ? Suicidal  ? ? ?Ronald [name] is a 40 y.o. male.  He is here with a complaint of being off of his psychiatric medications and wanting to kill himself.  He is brought in by the [facility] and his mother.  He endorses auditory hallucinations. ? ?The history is provided by the patient and a parent.  ?Mental Health Problem ?Presenting symptoms: hallucinations and suicidal threats   ?Patient accompanied by:  [place]ni [place] enforcement and family member ?Onset quality:  Unable to specify ?Chronicity:  Recurrent ?Context: noncompliance   ?Associated symptoms: no abdominal pain and no chest pain   ?Risk factors: hx of mental illness   ? ?  ? ?Home Medications ?Prior to Admission medications   ?Medication Sig Start Date End Date Taking? Authorizing Provider  ?acetic acid-hydrocortisone (VOSOL-HC) OTIC solution Place 3 drops into both ears 3 (three) times daily as needed. ?Patient not taking: Reported on 05/29/2021 09/20/20   [name], NP  ?Ascorbic Acid (VITA-C PO) Take 1 tablet by mouth daily.    [provider]  ?clonazePAM Scarlette Calico) 1 MG tablet Take 1 tablet by mouth twice daily 04/27/21   [name], NP  ?Melatonin 10 MG CAPS Take 10 mg by mouth at bedtime. ?Patient not taking: Reported on 05/29/2021 12/23/16   Sherren Mocha, MD  ?Multiple Vitamin (MULTI-VITAMIN DAILY PO) Take 1 tablet by mouth daily.    [provider]  ?traZODone (DESYREL) 50 MG tablet Take 0.5-1 tablets (25-50 mg total) by mouth at bedtime as needed for sleep. 08/07/21   [name], NP  ?VITAMIN D, CHOLECALCIFEROL, PO Take 1 tablet by mouth daily.    [provider]  ?Zinc Acetate, Oral, (ZINC ACETATE PO) Take 1 tablet by mouth daily.    [provider]  ?   ? ?Allergies    ?Hydroxyzine, Benadryl [diphenhydramine], Other, and Penicillins    ? ?Review of Systems   ?Review of Systems  ?Constitutional:  Negative for fever.  ?HENT:  Negative for sore throat.   ?Respiratory:  Negative for shortness of breath.   ?Cardiovascular:  Negative for chest pain.  ?Gastrointestinal:  Negative for abdominal pain.  ?Genitourinary:  Negative for dysuria.  ?Skin:  Negative for rash.  ?Psychiatric/Behavioral:  Positive for hallucinations.   ? ?Physical Exam ?Updated Vital Signs ?BP (!) 155/96 (BP Location: Right Arm)   Pulse 93   Temp 97.9 ?F (36.6 ?C) (Oral)   Resp 16   SpO2 94%  ?Physical Exam ?Vitals and nursing note reviewed.  ?Constitutional:   ?   General: He is not in acute distress. ?   Appearance: Normal appearance. He is well-developed.  ?HENT:  ?   Head: Normocephalic and atraumatic.  ?Eyes:  ?   Conjunctiva/sclera: Conjunctivae normal.  ?Cardiovascular:  ?   Rate and Rhythm: Normal rate and regular rhythm.  ?   Heart sounds: No murmur heard. ?Pulmonary:  ?   Effort: Pulmonary effort is normal. No respiratory distress.  ?   Breath sounds: Normal breath sounds.  ?Abdominal:  ?   Palpations: Abdomen is soft.  ?   Tenderness: There is no abdominal tenderness.  ?Musculoskeletal:     ?   General: No swelling.  ?   Cervical back: Neck supple.  ?Skin: ?   General: Skin is warm and dry.  ?  Capillary Refill: Capillary refill takes less than 2 seconds.  ?Neurological:  ?   General: No focal deficit present.  ?   Mental Status: He is alert.  ? ? ?ED Results / Procedures / Treatments   ?Labs ?(all labs ordered are listed, but only abnormal results are displayed) ?Labs Reviewed  ?COMPREHENSIVE METABOLIC PANEL - Abnormal; Notable for the following components:  ?    Result Value  ? Glucose, Bld 134 (*)   ? Calcium 8.5 (*)   ? All other components within normal limits  ?ETHANOL - Abnormal; Notable for the following components:  ? Alcohol, Ethyl (B) 215 (*)   ? All other components within normal limits  ?RAPID URINE DRUG SCREEN, HOSP PERFORMED - Abnormal; Notable for the  following components:  ? Tetrahydrocannabinol POSITIVE (*)   ? All other components within normal limits  ?RESP PANEL BY RT-PCR (FLU A&B, COVID) ARPGX2  ?CBC WITH DIFFERENTIAL/PLATELET  ? ? ?EKG ?EKG Interpretation ? ?Date/Time:  Monday Aug 13 2021 13:47:29 EDT ?Ventricular Rate:  85 ?PR Interval:  182 ?QRS Duration: 84 ?QT Interval:  352 ?QTC Calculation: 418 ?R Axis:   79 ?Text Interpretation: Normal sinus rhythm ST elevation, consider early repolarization, pericarditis, or injury Abnormal ECG When compared with ECG of 05-Jun-2021 13:52, No significant change since last tracing Confirmed by Meridee Score [phone]) on 08/13/2021 1:48:43 PM ? ?Radiology ?No results found. ? ?Procedures ?Procedures  ? ? ?Medications Ordered in ED ?Medications  ?traZODone (DESYREL) tablet 50 mg (has no administration in time range)  ?clonazePAM (KLONOPIN) tablet 1 mg (1 mg Oral Given 08/13/21 1357)  ?LORazepam (ATIVAN) injection 0-4 mg (0 mg Intravenous Not Given 08/13/21 1341)  ?  Or  ?LORazepam (ATIVAN) tablet 0-4 mg ( Oral See Alternative 08/13/21 1341)  ?LORazepam (ATIVAN) injection 0-4 mg (has no administration in time range)  ?  Or  ?LORazepam (ATIVAN) tablet 0-4 mg (has no administration in time range)  ?thiamine tablet 100 mg (100 mg Oral Given 08/13/21 1357)  ?  Or  ?thiamine (B-1) injection 100 mg ( Intravenous See Alternative 08/13/21 1357)  ?nicotine (NICODERM CQ - dosed in mg/24 hours) patch 21 mg (21 mg Transdermal Patch Applied 08/13/21 1357)  ?OLANZapine (ZYPREXA) tablet 10 mg (has no administration in time range)  ?OLANZapine zydis (ZYPREXA) disintegrating tablet 5 mg (has no administration in time range)  ? ? ?ED Course/ Medical Decision Making/ A&P ?  ?                        ?Medical Decision Making ?Amount and/or Complexity of Data Reviewed ?Labs: ordered. ? ?Risk ?OTC drugs. ?Prescription drug management. ? ?This patient complains of Seidel thoughts auditory hallucinations medication noncompliance; this involves an extensive  number of treatment ?Options and is a complaint that carries with it a high risk of complications and ?morbidity. The differential includes psychosis, suicidal ideation, intoxication ? ?I ordered, reviewed and interpreted labs, which included CBC normal, chemistries fairly unremarkable, COVID and flu negative, alcohol level elevated, talk screen positive for THC ?I ordered medication patient's prior psychiatric meds and CIWA for possible alcohol withdrawal and reviewed PMP when indicated. ?Additional history obtained from patient's mother ?Previous records obtained and reviewed in epic including prior psychiatry notes ?I consulted TTS and discussed lab and imaging findings and discussed disposition.  ?Social determinants considered, patient's ongoing substance and tobacco use, poor follow-up ?Critical Interventions: None ? ?After the interventions stated above, I reevaluated the patient and found patient  be redirectable ?Admission and further testing considered, he would benefit from psychiatric evaluation for possible inpatient psychiatric care.  Patient placed on an IVC. ? ? ? ? ? ? ? ? ? ?Final Clinical Impression(s) / ED Diagnoses ?Final diagnoses:  ?Suicidal thoughts  ?Polysubstance abuse (HCC)  ?Auditory hallucinations  ? ? ?Rx / DC Orders ?ED Discharge Orders   ? ? None  ? ?  ? ? ?  ?Terrilee FilesButler, Tyrell Brereton C, MD ?08/13/21 1845 ? ?

## 2021-08-13 NOTE — Telephone Encounter (Signed)
CC: Juliann Pulse ? ?I was able to speak with this patient in regards to his concerns. He was tearful and yelling throughout visit though very apologetic for his state. ?He showed rapid oscillations in insight to his psychosis - at times acknowledging that he was hearing abnormal voices and needed to be on his antipsychotics and at other points expressing severe fear of his hallucinations planning him, his mother, and I harm at different points.  ?I was able to call 911 and have [facility] Dept respond to scene, as he did threaten to shoot himself and his mother expressed concern about pt having knives. I maintained connection with patient and discussed this plan with him. He was agreeable to go with [facility] to [facility] ED. He will likely face IVC given his acute psychosis and intent to self harm. He understands this. ?[facility] ED Charge nurse called and consulted about this case, expressed understanding.  ? ?Kathrin Ruddy, NP

## 2021-08-14 NOTE — ED Notes (Signed)
Received a referral callback from [place] at [facility], MD is reviewing for placement. ?

## 2021-08-14 NOTE — ED Notes (Signed)
[facility]  ?Accepting: Dr. Leane Call  ?[id number]  ?Report line: [phone] ?

## 2021-08-14 NOTE — ED Provider Notes (Signed)
Emergency Medicine Observation Re-evaluation Note ? ?Ronald [name] is a 41 y.o. male, seen on rounds today.  Pt initially presented to the ED for complaints of Suicidal ?Currently, the patient is asleep. ? ?Physical Exam  ?BP (!) 129/91 Comment: Patient sleeping at this time  Pulse 64   Temp (!) 97.4 ?F (36.3 ?C) (Oral)   Resp 14   SpO2 95%  ?Physical Exam ?General: NAD ?Cardiac: well perfused ?Lungs: even and unlabored ?Psych: no agitation ? ?ED Course / MDM  ?EKG:EKG Interpretation ? ?Date/Time:  Monday Aug 13 2021 13:47:29 EDT ?Ventricular Rate:  85 ?PR Interval:  182 ?QRS Duration: 84 ?QT Interval:  352 ?QTC Calculation: 418 ?R Axis:   79 ?Text Interpretation: Normal sinus rhythm ST elevation, consider early repolarization, pericarditis, or injury Abnormal ECG When compared with ECG of 05-Jun-2021 13:52, No significant change since last tracing Confirmed by Meridee Score [phone]) on 08/13/2021 1:48:43 PM ? ?I have reviewed the labs performed to date as well as medications administered while in observation.  Recent changes in the last 24 hours include Pt accepted to [facility] for inpatient treatment. ? ?[facility]  ?Accepting: Dr. Leane Call  ?[id number]  ?Report line: [phone] ? ?EMTALA completed prior to transfer. ? ?Plan  ?Current plan is for transfer to [facility] today. ?Ronald [name] is under involuntary commitment. ?  ? ?  ?Ernie Avena, MD ?08/14/21 [phone] ? ?

## 2021-08-14 NOTE — ED Notes (Signed)
Sheriff called for transport  

## 2021-08-14 NOTE — Progress Notes (Signed)
Inpatient Behavioral Health Placement ? ?Pt meets inpatient criteria per Dahlia Byes, NP.Referral was sent to the following facilities;  ? ?Destination ?Service Provider Address Phone Fax  ?[facility]  [address] [place] [name] [address] [phone]  ?[facility]  [address] [name] [address] [phone]  ?[facility]  [address] [place], [name] [address] [phone]  ?[facility] [address] [name] [address] [phone]  ?[facility]  [address] [place], [name] [address] [phone]  ?[facility]  [address] [name] [address] [phone]  ?[facility]  [address] [name] [address] [phone]  ?[facility]  [address], [name] [address] [phone]  ?[facility]  [address] [name] [address] [phone]  ?[facility]  [address] [place] [name] [address] [phone]  ?[facility]  [address] [name] [address] [phone]  ?[facility]  [address], [name] [address] [phone]  ?[facility]  [address] [place], [name] [address] [phone]  ?[facility]  [address] [place], [name] [address] [phone]  ? ? ?Situation ongoing,  CSW will follow up. ? ? ?Maryjean Ka, MSW, LCSWA ?08/14/2021  @ 12:38 AM ? ?

## 2021-08-23 ENCOUNTER — Other Ambulatory Visit: Payer: Self-pay | Admitting: Registered Nurse

## 2021-08-23 DIAGNOSIS — F331 Major depressive disorder, recurrent, moderate: Secondary | ICD-10-CM

## 2021-08-23 NOTE — Telephone Encounter (Signed)
Patient is requesting a refill of the following medications: ?Requested Prescriptions  ? ?Pending Prescriptions Disp Refills  ? clonazePAM (KLONOPIN) 1 MG tablet [Pharmacy Med Name: clonazePAM 1 MG Oral Tablet] 60 tablet 0  ?  Sig: Take 1 tablet by mouth twice daily  ? ? ?Date of patient request: 08/23/2021 ?Last office visit: 03/21/2021 ?Date of last refill: 04/27/2021 ?Last refill amount: 60 tablets  ?Follow up time period per chart: 09/19/2021 ? ?

## 2021-09-19 ENCOUNTER — Ambulatory Visit (INDEPENDENT_AMBULATORY_CARE_PROVIDER_SITE_OTHER): Payer: Self-pay | Admitting: Registered Nurse

## 2021-09-19 ENCOUNTER — Encounter: Payer: Self-pay | Admitting: Registered Nurse

## 2021-09-19 VITALS — BP 128/86 | HR 90 | Temp 98.0°F | Resp 18 | Ht 71.0 in | Wt 188.0 lb

## 2021-09-19 DIAGNOSIS — F39 Unspecified mood [affective] disorder: Secondary | ICD-10-CM

## 2021-09-19 MED ORDER — DIVALPROEX SODIUM 250 MG PO DR TAB: 750.0000 mg | DELAYED_RELEASE_TABLET | Freq: Two times a day (BID) | ORAL | 1 refills | Status: DC

## 2021-09-19 NOTE — Progress Notes (Signed)
Established Patient Office Visit  Subjective:  Patient ID: Ronald [name], male    DOB: [birth year]  Age: 41 y.o. MRN: [id number]  CC:  Chief Complaint  Patient presents with   Medication Management    Patient states he is here to review medications     HPI Ronald [name] presents for med review  Recent psychiatric admission for acute psychosis with SI Had been on abilify, olanzapine, trazodone, and clonazepam previously.  Has been taken off of these. Now on depakote 750mg  DR po bid.  Doing much better. No hi/si Does not have any auditory or visual hallucinations No neuro side effects.  Would like to est with outpatient psychiatry.   Outpatient Medications Prior to Visit  Medication Sig Dispense Refill   Ascorbic Acid (VITAMIN C) 1000 MG tablet Take 1,000 mg by mouth 3 (three) times daily.     Cholecalciferol (VITAMIN D3) 250 MCG (10000 UT) TABS Take 10,000 Units by mouth every morning.     ibuprofen (ADVIL) 200 MG tablet Take 400 mg by mouth at bedtime as needed for headache (pain).     Melatonin 10 MG CAPS Take 10 mg by mouth at bedtime.     Multiple Vitamin (MULTIVITAMIN WITH MINERALS) TABS tablet Take 1 tablet by mouth every morning.     OVER THE COUNTER MEDICATION Take 15 mLs by mouth every morning. Carbon 60 - powder     OVER THE COUNTER MEDICATION Take 6 g by mouth every morning. Neocell - collagen, hyaluronic acid, biotin     ARIPiprazole (ABILIFY) 5 MG tablet Take 5 mg by mouth at bedtime. (Patient not taking: Reported on 08/13/2021)     clonazePAM (KLONOPIN) 1 MG tablet Take 1 tablet by mouth twice daily (Patient not taking: Reported on 08/13/2021) 60 tablet 0   divalproex (DEPAKOTE) 250 MG DR tablet Take 750 mg by mouth 2 (two) times daily.     OLANZapine (ZYPREXA) 15 MG tablet Take 15 mg by mouth at bedtime. (Patient not taking: Reported on 08/13/2021)     traZODone (DESYREL) 50 MG tablet Take 0.5-1 tablets (25-50 mg total) by mouth at bedtime as needed for sleep.  (Patient not taking: Reported on 08/13/2021) 90 tablet 3   No facility-administered medications prior to visit.    Review of Systems  Constitutional: Negative.   HENT: Negative.    Eyes: Negative.   Respiratory: Negative.    Cardiovascular: Negative.   Gastrointestinal: Negative.   Genitourinary: Negative.   Musculoskeletal: Negative.   Skin: Negative.   Neurological: Negative.   Psychiatric/Behavioral: Negative.    All other systems reviewed and are negative.    Objective:     BP 128/86   Pulse 90   Temp 98 F (36.7 C) (Temporal)   Resp 18   Ht 5\' 11"  (1.803 m)   Wt 188 lb (85.3 kg)   SpO2 99%   BMI 26.22 kg/m   Wt Readings from Last 3 Encounters:  09/19/21 188 lb (85.3 kg)  05/29/21 170 lb (77.1 kg)  03/21/21 179 lb 9.6 oz (81.5 kg)   Physical Exam Constitutional:      General: He is not in acute distress.    Appearance: Normal appearance. He is normal weight. He is not ill-appearing, toxic-appearing or diaphoretic.  Cardiovascular:     Rate and Rhythm: Normal rate and regular rhythm.     Heart sounds: Normal heart sounds. No murmur heard.   No friction rub. No gallop.  Pulmonary:  Effort: Pulmonary effort is normal. No respiratory distress.     Breath sounds: Normal breath sounds. No stridor. No wheezing, rhonchi or rales.  Chest:     Chest wall: No tenderness.  Neurological:     General: No focal deficit present.     Mental Status: He is alert and oriented to person, place, and time. Mental status is at baseline.  Psychiatric:        Mood and Affect: Mood normal.        Behavior: Behavior normal.        Thought Content: Thought content normal.        Judgment: Judgment normal.    No results found for any visits on 09/19/21.    The 10-year ASCVD risk score (Arnett DK, et al., 2019) is: 0.4%    Assessment & Plan:   Problem List Items Addressed This Visit       Other   Mood disorder (HCC) - Primary   Relevant Medications   divalproex  (DEPAKOTE) 250 MG DR tablet   Other Relevant Orders   Ambulatory referral to Psychiatry    Meds ordered this encounter  Medications   divalproex (DEPAKOTE) 250 MG DR tablet    Sig: Take 3 tablets (750 mg total) by mouth 2 (two) times daily.    Dispense:  270 tablet    Refill:  1    Order Specific Question:   Supervising Provider    Answer:   Neva Seat, JEFFREY R [[id number]]    Return in about 6 months (around 03/21/2022) for CPE and labs.   PLAN [facility] refill on depakote. Pt understands that this will be managed long term by psychiatry Referral to psychiatry placed. He will follow up with them Return to this office for CPE and labs in 6 mo Patient encouraged to call clinic with any questions, comments, or concerns.   [name], NP

## 2021-09-19 NOTE — Patient Instructions (Addendum)
Ronald [name] -   [name] to see you  Glad the depakote is yielding some stability  We will plan for psychiatry to manage this long term. I have referred you to [facility], who will call you to schedule.  I have given a temporary refill to get you through until that appointment  See you in 6 months for a physical and labs  Call sooner if concerns arise  Thank you,  [name]     If you have lab work done today you will be contacted with your lab results within the next 2 weeks.  If you have not heard from [place] then please contact us. The fastest way to get your results is to register for My Chart.   IF you received an x-ray today, you will receive an invoice from [facility]. Please contact [facility] at [phone] with questions or concerns regarding your invoice.   IF you received labwork today, you will receive an invoice from [facility]. Please contact [facility] at [phone] with questions or concerns regarding your invoice.   Our billing staff will not be able to assist you with questions regarding bills from these companies.  You will be contacted with the lab results as soon as they are available. The fastest way to get your results is to activate your My Chart account. Instructions are located on the last page of this paperwork. If you have not heard from [place] regarding the results in 2 weeks, please contact this office.

## 2021-09-24 ENCOUNTER — Emergency Department (HOSPITAL_COMMUNITY): Payer: Self-pay

## 2021-09-24 ENCOUNTER — Encounter (HOSPITAL_COMMUNITY): Payer: Self-pay

## 2021-09-24 ENCOUNTER — Telehealth: Payer: Self-pay | Admitting: Registered Nurse

## 2021-09-24 ENCOUNTER — Emergency Department (HOSPITAL_COMMUNITY)
Admission: EM | Admit: 2021-09-24 | Discharge: 2021-09-24 | Disposition: A | Discharge status: Home / Self Care | Payer: No Payment, Other | Attending: Emergency Medicine | Admitting: Emergency Medicine

## 2021-09-24 DIAGNOSIS — N179 Acute kidney failure, unspecified: Secondary | ICD-10-CM | POA: Insufficient documentation

## 2021-09-24 DIAGNOSIS — F411 Generalized anxiety disorder: Secondary | ICD-10-CM | POA: Diagnosis not present

## 2021-09-24 DIAGNOSIS — R0789 Other chest pain: Secondary | ICD-10-CM | POA: Insufficient documentation

## 2021-09-24 DIAGNOSIS — F419 Anxiety disorder, unspecified: Secondary | ICD-10-CM | POA: Insufficient documentation

## 2021-09-24 DIAGNOSIS — R079 Chest pain, unspecified: Secondary | ICD-10-CM

## 2021-09-24 DIAGNOSIS — Z20822 Contact with and (suspected) exposure to covid-19: Secondary | ICD-10-CM | POA: Insufficient documentation

## 2021-09-24 DIAGNOSIS — Z7983 Long term (current) use of bisphosphonates: Secondary | ICD-10-CM | POA: Insufficient documentation

## 2021-09-24 DIAGNOSIS — F410 Panic disorder [episodic paroxysmal anxiety] without agoraphobia: Secondary | ICD-10-CM | POA: Insufficient documentation

## 2021-09-24 LAB — COMPREHENSIVE METABOLIC PANEL
ALT: 29 U/L (ref 0–44)
AST: 38 U/L (ref 15–41)
Albumin: 4.4 g/dL (ref 3.5–5.0)
Alkaline Phosphatase: 41 U/L (ref 38–126)
Anion gap: 11 (ref 5–15)
BUN: 16 mg/dL (ref 6–20)
CO2: 22 mmol/L (ref 22–32)
Calcium: 9.4 mg/dL (ref 8.9–10.3)
Chloride: 103 mmol/L (ref 98–111)
Creatinine, Ser: 1.34 mg/dL — ABNORMAL HIGH (ref 0.61–1.24)
GFR, Estimated: >60 mL/min (ref >60)
Glucose, Bld: 85 mg/dL (ref 70–99)
Potassium: 4.4 mmol/L (ref 3.5–5.1)
Sodium: 136 mmol/L (ref 135–145)
Total Bilirubin: 1.1 mg/dL (ref 0.3–1.2)
Total Protein: 7.6 g/dL (ref 6.5–8.1)

## 2021-09-24 LAB — RAPID URINE DRUG SCREEN, HOSP PERFORMED
Amphetamines: NOT DETECTED
Barbiturates: NOT DETECTED
Benzodiazepines: NOT DETECTED
Cocaine: NOT DETECTED
Opiates: NOT DETECTED
Tetrahydrocannabinol: POSITIVE — AB

## 2021-09-24 LAB — CBC WITH DIFFERENTIAL/PLATELET
Abs Immature Granulocytes: 0.04 10*3/uL (ref 0.00–0.07)
Basophils Absolute: 0 10*3/uL (ref 0.0–0.1)
Basophils Relative: 0 %
Eosinophils Absolute: 0 10*3/uL (ref 0.0–0.5)
Eosinophils Relative: 0 %
HCT: 45.5 % (ref 39.0–52.0)
Hemoglobin: 15.5 g/dL (ref 13.0–17.0)
Immature Granulocytes: 0 %
Lymphocytes Relative: 16 %
Lymphs Abs: 1.5 10*3/uL (ref 0.7–4.0)
MCH: 30.1 pg (ref 26.0–34.0)
MCHC: 34.1 g/dL (ref 30.0–36.0)
MCV: 88.3 fL (ref 80.0–100.0)
Monocytes Absolute: 0.8 10*3/uL (ref 0.1–1.0)
Monocytes Relative: 8 %
Neutro Abs: 7.5 10*3/uL (ref 1.7–7.7)
Neutrophils Relative %: 76 %
Platelets: 225 10*3/uL (ref 150–400)
RBC: 5.15 MIL/uL (ref 4.22–5.81)
RDW: 12.7 % (ref 11.5–15.5)
WBC: 9.9 10*3/uL (ref 4.0–10.5)
nRBC: 0 % (ref 0.0–0.2)

## 2021-09-24 LAB — TROPONIN I (HIGH SENSITIVITY)
Troponin I (High Sensitivity): 6 ng/L (ref <18)
Troponin I (High Sensitivity): 7 ng/L (ref <18)

## 2021-09-24 LAB — RESP PANEL BY RT-PCR (FLU A&B, COVID) ARPGX2
Influenza A by PCR: NEGATIVE
Influenza B by PCR: NEGATIVE
SARS Coronavirus 2 by RT PCR: NEGATIVE

## 2021-09-24 LAB — SALICYLATE LEVEL: Salicylate Lvl: <7 mg/dL — ABNORMAL LOW (ref 7.0–30.0)

## 2021-09-24 LAB — ETHANOL: Alcohol, Ethyl (B): <10 mg/dL (ref <10)

## 2021-09-24 LAB — ACETAMINOPHEN LEVEL: Acetaminophen (Tylenol), Serum: <10 ug/mL — ABNORMAL LOW (ref 10–30)

## 2021-09-24 MED ORDER — TRAZODONE HCL 50 MG PO TABS: 50.0000 mg | ORAL_TABLET | Freq: Every day | ORAL | 0 refills | Status: DC

## 2021-09-24 MED ORDER — LACTATED RINGERS IV BOLUS
1000.0000 mL | Freq: Once | INTRAVENOUS | Status: AC
  Administered 2021-09-24: 1000 mL via INTRAVENOUS

## 2021-09-24 MED ORDER — NICOTINE 14 MG/24HR TD PT24: 14.0000 mg | MEDICATED_PATCH | Freq: Every day | TRANSDERMAL | 0 refills | Status: DC

## 2021-09-24 MED ORDER — NICOTINE 14 MG/24HR TD PT24
14.0000 mg | MEDICATED_PATCH | Freq: Once | TRANSDERMAL | Status: DC
  Administered 2021-09-24: 14 mg via TRANSDERMAL
  Filled 2021-09-24: qty 1

## 2021-09-24 MED ORDER — LORAZEPAM 1 MG PO TABS
1.0000 mg | ORAL_TABLET | Freq: Once | ORAL | Status: AC
Start: 2021-09-24 — End: 2021-09-24
  Administered 2021-09-24: 1 mg via ORAL
  Filled 2021-09-24: qty 1

## 2021-09-24 MED ORDER — TRAZODONE HCL 100 MG PO TABS: 50.0000 mg | ORAL_TABLET | Freq: Every day | ORAL | Status: DC

## 2021-09-24 NOTE — ED Notes (Signed)
Patient changed into scrubs and wanded by security. Belongings remain with mother who reports she will take home.

## 2021-09-24 NOTE — ED Notes (Signed)
Patient's mother, [name], reports she will be ride at time of d/c. [phone]

## 2021-09-24 NOTE — ED Notes (Signed)
TTS at bedside. 

## 2021-09-24 NOTE — ED Notes (Signed)
Patient given cheese, crackers, and soda.

## 2021-09-24 NOTE — ED Provider Notes (Signed)
[facility]-EMERGENCY DEPT Provider Note   CSN: [id number] Arrival date & time: 09/24/21  1120     History Chief Complaint  Patient presents with   Psychiatric Evaluation    Ronald [name] is a 41 y.o. male with history of MDD, psychosis undefined, schizophrenia, and bipolar presents the emergency department for evaluation recurrent anxiety and panic attacks has been having.  He reports he has been having pain since night.  This morning, he felt that he had some crushing chest pain which she chalked up to his anxiety.  He reports that he is almost out of his medications.  He reports he takes Klonopin as needed, Depakote, Zyprexa, olanzapine, Vraylar, and the nicotine patch.  He denies any SI or HI currently.  Denies any AVH.  He reports that he occasionally does have a history that he is out of his medications she is trying to avoid.  He would like further management of his anxiety and panic attacks as well as a better medication management and would like to speak to someone in the psych department.   HPI     Home Medications Prior to Admission medications   Medication Sig Start Date End Date Taking? Authorizing Provider  Cariprazine HCl (VRAYLAR PO) Take 1 tablet by mouth at bedtime.   Yes [provider]  clonazePAM (KLONOPIN) 1 MG tablet Take 1 mg by mouth daily as needed for anxiety.   Yes [provider]  divalproex (DEPAKOTE) 250 MG DR tablet Take 3 tablets (750 mg total) by mouth 2 (two) times daily. Patient taking differently: Take 250 mg by mouth 2 (two) times daily. 09/19/21  Yes [name], NP  ibuprofen (ADVIL) 200 MG tablet Take 400 mg by mouth at bedtime as needed for headache (pain).   Yes [provider]  Melatonin 10 MG CAPS Take 10 mg by mouth at bedtime. 12/23/16  Yes Sherren Mocha, MD  nicotine (NICODERM CQ - DOSED IN MG/24 HOURS) 21 mg/24hr patch Place 21 mg onto the skin daily.   Yes [provider]   OLANZapine (ZYPREXA) 15 MG tablet Take 15 mg by mouth at bedtime.   Yes [provider]  traZODone (DESYREL) 50 MG tablet Take 50 mg by mouth at bedtime as needed for sleep.   Yes [provider]      Allergies    Hydroxyzine, Benadryl [diphenhydramine], Other, and Penicillins    Review of Systems   Review of Systems  Constitutional:  Negative for diaphoresis.  Respiratory:  Negative for shortness of breath.   Cardiovascular:  Positive for chest pain.  Gastrointestinal:  Negative for nausea.  Psychiatric/Behavioral:  Negative for dysphoric mood, hallucinations and suicidal ideas. The patient is nervous/anxious.     Physical Exam Updated Vital Signs BP 120/68   Pulse 71   Temp 98.5 F (36.9 C) (Oral)   Resp 17   SpO2 95%  Physical Exam Vitals and nursing note reviewed.  Constitutional:      Appearance: Normal appearance.  Eyes:     General: No scleral icterus. Pulmonary:     Effort: Pulmonary effort is normal. No respiratory distress.  Skin:    General: Skin is dry.     Findings: No rash.  Neurological:     General: No focal deficit present.     Mental Status: He is alert. Mental status is at baseline.  Psychiatric:        Attention and Perception: Attention normal.  Mood and Affect: Affect normal. Mood is anxious.     Comments: Very polite.  Normal speech.  He does appear anxious.  Does not appear to be responding to any internal stimuli.  Denies any HI/SI.     ED Results / Procedures / Treatments   Labs (all labs ordered are listed, but only abnormal results are displayed) Labs Reviewed  COMPREHENSIVE METABOLIC PANEL - Abnormal; Notable for the following components:      Result Value   Creatinine, Ser 1.34 (*)    All other components within normal limits  ACETAMINOPHEN LEVEL - Abnormal; Notable for the following components:   Acetaminophen (Tylenol), Serum <10 (*)    All other components within normal limits  SALICYLATE LEVEL -  Abnormal; Notable for the following components:   Salicylate Lvl <7.0 (*)    All other components within normal limits  RESP PANEL BY RT-PCR (FLU A&B, COVID) ARPGX2  ETHANOL  CBC WITH DIFFERENTIAL/PLATELET  RAPID URINE DRUG SCREEN, HOSP PERFORMED  TROPONIN I (HIGH SENSITIVITY)  TROPONIN I (HIGH SENSITIVITY)    EKG EKG Interpretation  Date/Time:  Monday September 24 2021 13:12:27 EDT Ventricular Rate:  89 PR Interval:  169 QRS Duration: 77 QT Interval:  324 QTC Calculation: 395 R Axis:   83 Text Interpretation: Sinus rhythm Early repolarization Confirmed by Lorre NickAllen, Anthony ([id number]) on 09/24/2021 2:14:10 PM  Radiology DG Chest 2 View  Result Date: 09/24/2021 CLINICAL DATA:  chest pain EXAM: CHEST - 2 VIEW COMPARISON:  Chest radiograph November 6 21. FINDINGS: No consolidation. No visible pleural effusions or pneumothorax. Cardiomediastinal silhouette is within normal limits. No acute osseous abnormality. IMPRESSION: No active cardiopulmonary disease. Electronically Signed   By: Feliberto HartsFrederick S Jones M.D.   On: 09/24/2021 13:40    Procedures Procedures   Medications Ordered in ED Medications  lactated ringers bolus 1,000 mL (has no administration in time range)  nicotine (NICODERM CQ - dosed in mg/24 hours) patch 14 mg (has no administration in time range)  LORazepam (ATIVAN) tablet 1 mg (1 mg Oral Given 09/24/21 1324)    ED Course/ Medical Decision Making/ A&P Clinical Course as of 09/24/21 1532  Mon Sep 24, 2021  1518 No SI/HI. Wants to talk to TTS regarding anxiety. Bolus for small AKI. Endorsed crushing chest pain just PTA, needs delta troponin.   [CP]    Clinical Course User Index [CP] Prosperi, Harrel Carinahristian H, PA-C                           Medical Decision Making Amount and/or Complexity of Data Reviewed Labs: ordered. Radiology: ordered.  Risk OTC drugs. Prescription drug management.  41 y/o M presents to the ED for evaluation of his continued anxiety and panic attacks.   Vital signs are unremarkable.  Patient normotensive, afebrile, normal pulse rate, satting well room air without increased work of breathing.  Physical exam is pertinent for very polite but anxious appearing gentleman.  Normal affect.  He denies any HI or SI.  Does not appear to be responding to any internal stimuli.  He has a extensive psychological history.  Given his age as well as his chest pain, although likely his panic, will order EKG and troponins.  I independently reviewed and interpreted the patient's labs.  Patient has new AKI with a creatinine of 1.34 which is a greater than 0.50 raise from his previous.  Negative acetaminophen, salicylate, and ethanol levels.  CBC shows no leukocytosis or anemia needed.  Urinalysis still needs to be collected.  Respiratory panel negative for COVID and flu.  Initial troponin at 6.  Repeat pending.  My attending reviewed the EKGs and reported that the EKG from today appears to be very similar to the one in May.  Early repole seen, otherwise normal sinus rhythm.  Patient was appearing anxious so ordered 2 mg of Ativan as well as 1L or LR for the patient's mild AKI.  Once the patient still troponin results and he finishes his liter bag, if normal, will be medically cleared to consult TTS.  Discharging to oncoming shift, [employer], PA-C.   Final Clinical Impression(s) / ED Diagnoses Final diagnoses:  None    Rx / DC Orders ED Discharge Orders     None         Achille Rich, Cordelia Poche 09/24/21 1546    Lorre Nick, MD 09/25/21 1611

## 2021-09-24 NOTE — ED Notes (Signed)
Patient given sandwich.  

## 2021-09-24 NOTE — ED Notes (Signed)
Patient left with mother.

## 2021-09-24 NOTE — ED Provider Notes (Signed)
Accepted handoff at shift change from [facility]. Please see prior provider note for more detail.   Briefly: Patient is 41 y.o.   DDX: concern for chest pain, history of depression, psychosis, schizophrenia, bipolar who has been having uncontrolled anxiety/panic attacks.  Plan: Patient with mild AKI creatinine 1.34 from baseline of around 0.9 since last measurement about a month ago.  We will give him a fluid bolus at this time.  Patient given some Ativan to help with anxiety.  He has negative troponin x2 in context of chest pain which patient thinks is likely related to his panic attack.  No previous history of ACS.  He is cleared from medical perspective at this time, and he is awaiting TTS evaluation.  Cleared from TTS perspective, they report he is drug seeking Ativan. He will be discharged with trazodone and nicotine patch.    RISR  EDTHIS     [place] 09/24/21 1821    Glendora Score, MD 09/25/21 [phone]

## 2021-09-24 NOTE — Telephone Encounter (Signed)
error 

## 2021-09-24 NOTE — Discharge Summary (Signed)
[facility] Psych ED Discharge  09/24/2021 6:17 PM Ronald [name]  MRN:  [id number]  Principal Problem: GAD (generalized anxiety disorder) Discharge Diagnoses: Principal Problem:   GAD (generalized anxiety disorder)  Clinical Impression:  Final diagnoses:  None   Subjective: Patient is a 41 years old male who was brought in by EMS from home for chest pain related to anxiety.  Patient stated that he wanted his heart to be checked out and also he wanted to see if he can get a prescription for klonopin, Depakote, Vraylar and  Trazodone.  Patient was in the room with his mother when provider walked in.   Patient reported that he has been taking Ativan to replace Klonopin and that he saw his Psychiatric provider-PA last Wednesday.  He forgot to ask for refill of Ativan or Klonopin and decided to come to the ER for a refill.  He suddenly changed his story and stated that he has Depakote and Vraylar at [facility] and plans to pick those in am.  At this time his mother asked him to tell the provider what is happening and why he came to the ER.  His mother threatened him that if he does not say the truth she was going to do so.  Patient walked his mother out of the room.  Provider informed him that his Ativan or Klonopin will be handled by his outpatient Mental health provider.  He declined offer for Hydroxyzine.  He agreed to take Trazodone for night sleep and go back to [facility] for the rest of his medications. This patient was seen by this provider early May in the ER for manic episode and was hospitalized at [facility].  At the time of last Month's visit patient vehemently refused to provide Urine for uds.  At that visit he requested Klonopin and it was not given and he was educated on the Addictive nature of Klonopin.  Patient has long hx of Mental illness and multiple inpatient Psychiatric hospitalization.  He denied SI/HI/AVH and no mention of Paranoia.  Review of PDMP site shows he last filled  Klonopin in January of this year.  Trazodone will be given for 7 days supply only while he goes back to his outpatient Psychiatric provider.  ED Assessment Time Calculation: Start Time: 1749 Stop Time: 1817 Total Time in Minutes (Assessment Completion): 28   Past Psychiatric History: Patient has had multiple psychiatric hospitalizations -he has been hospitalized at [facility] and [facility], [facility] and [facility] in March.  His diagnosis includes Bipolar 1 disorder,  Schizophrenia,Paranoid type, polysubstance abuse, and PTSD.  Hospitalized at [facility] last month  Past Medical History:  Past Medical History:  Diagnosis Date   Anxiety    Arthritis    Disorder of pineal gland    Post traumatic stress disorder     Past Surgical History:  Procedure Laterality Date   ANKLE SURGERY     Family History: No family history on file. Family Psychiatric  History: Mother-Depression after son was murdered, Copywriter, advertising, Deceased brother was an addict as well. Social History:  Social History   Substance and Sexual Activity  Alcohol Use No     Social History   Substance and Sexual Activity  Drug Use Yes   Types: Amphetamines, Benzodiazepines, Cocaine, Marijuana, Methamphetamines   Comment: one year ago    Social History   Socioeconomic History   Marital status: Single    Spouse name: Not on file   Number of children: Not on file  Years of education: Not on file   Highest education level: Not on file  Occupational History   Not on file  Tobacco Use   Smoking status: Never   Smokeless tobacco: Current    Types: Chew  Vaping Use   Vaping Use: Never used  Substance and Sexual Activity   Alcohol use: No   Drug use: Yes    Types: Amphetamines, Benzodiazepines, Cocaine, Marijuana, Methamphetamines    Comment: one year ago   Sexual activity: Never  Other Topics Concern   Not on file  Social History Narrative   Not on file   Social Determinants of Health   Financial Resource Strain:  Not on file  Food Insecurity: Not on file  Transportation Needs: Not on file  Physical Activity: Not on file  Stress: Not on file  Social Connections: Not on file    Tobacco Cessation:  A prescription for an FDA-approved tobacco cessation medication was offered at discharge and the patient refused  Current Medications: Current Facility-Administered Medications  Medication Dose Route Frequency Provider Last Rate Last Admin   nicotine (NICODERM CQ - dosed in mg/24 hours) patch 14 mg  14 mg Transdermal Once Achille RichRansom, Riley, PA-C   14 mg at 09/24/21 1542   traZODone (DESYREL) tablet 50 mg  50 mg Oral QHS Dahlia Byesnuoha, Adreonna Yontz C, NP       Current Outpatient Medications  Medication Sig Dispense Refill   Cariprazine HCl (VRAYLAR) 4.5 MG CAPS Take 1 capsule by mouth at bedtime.     divalproex (DEPAKOTE) 250 MG DR tablet Take 3 tablets (750 mg total) by mouth 2 (two) times daily. (Patient taking differently: Take 250 mg by mouth 2 (two) times daily.) 270 tablet 1   ibuprofen (ADVIL) 200 MG tablet Take 400 mg by mouth at bedtime as needed for headache (pain).     Melatonin 10 MG CAPS Take 10 mg by mouth at bedtime.     OLANZapine (ZYPREXA) 15 MG tablet Take 15 mg by mouth at bedtime.     nicotine (NICODERM CQ - DOSED IN MG/24 HOURS) 14 mg/24hr patch Place 1 patch (14 mg total) onto the skin daily for 7 days. 7 patch 0   traZODone (DESYREL) 50 MG tablet Take 1 tablet (50 mg total) by mouth at bedtime for 7 days. 7 tablet 0   PTA Medications: (Not in a hospital admission)   [place] Scale:  [name]  C-SSRS RISK CATEGORY No Risk High Risk Error: Q2 is Yes, you must answer 3, 4, and 5       Musculoskeletal: Strength & Muscle Tone: within normal limits Gait & Station: normal Patient leans:  Front  Psychiatric Specialty Exam: Presentation  General Appearance: Appropriate for Environment; Casual  Eye Contact:Good  Speech:Clear and Coherent; Normal Rate  Speech Volume:Normal  Handedness:Right   Mood and Affect  Mood:Euthymic  Affect:Congruent   Thought Process  Thought Processes:Coherent; Goal Directed; Linear  Descriptions of Associations:Intact  Orientation:Full (Time, Place and Person)  Thought Content:Logical  History of Schizophrenia/Schizoaffective disorder:No  Duration of Psychotic Symptoms:Greater than six months  Hallucinations:Hallucinations: None  Ideas of Reference:None  Suicidal Thoughts:Suicidal Thoughts: No  Homicidal Thoughts:Homicidal Thoughts: No   Sensorium  Memory:Immediate Good; Recent Good; Remote Good  Judgment:Fair  Insight:Good   Executive Functions  Concentration:Good  Attention Span:Good  Recall:Good  Fund of  Knowledge:Good  Language:Good   Psychomotor Activity  Psychomotor Activity:Psychomotor Activity: Normal   Assets  Assets:Communication Skills; Desire for Improvement; Housing; Health and safety inspector; Leisure Time   Sleep  Sleep:Sleep: Good    Physical Exam: Physical Exam ROS Blood pressure (!) 149/101, pulse 74, temperature 98.5 F (36.9 C), temperature source Oral, resp. rate 18, SpO2 93 %. There is no height or weight on file to calculate BMI.   Demographic Factors:  Male, Adolescent or young adult, Caucasian, Low socioeconomic status, and Unemployed  Loss Factors: Financial problems/change in socioeconomic status  Historical Factors: Family history of mental illness or substance abuse and lost 8 family members and friend in 7 years  Risk Reduction Factors:   Living with another person, especially a relative  Continued Clinical Symptoms:  Severe Anxiety and/or Agitation Bipolar Disorder:   Mixed State Alcohol/Substance Abuse/Dependencies Previous Psychiatric Diagnoses  and Treatments  Cognitive Features That Contribute To Risk:  None    Suicide Risk:  Minimal: No identifiable suicidal ideation.  Patients presenting with no risk factors but with morbid ruminations; may be classified as minimal risk based on the severity of the depressive symptoms    Plan Of Care/Follow-up recommendations:  Activity:  as tolerated Diet:  regular  Medical Decision Making: Patient does not meet criteria for inpatient psychiatric hospitalization.  He denied SI/HI/AVH  and no mention of paranoia.  He plans to pick up his medications from [facility] tomorrow.  He will be given 7 days prescription for Trazodone and Nicotine until his sees his outpatient provider.    Problem 1: Generalized anxiety disorder  Problem 2: Schizoaffective disorder, Bipolar type  Problem 3: PTSD  Disposition: Discharge  Earney Navy, NP-PMHNP-BC 09/24/2021, 6:17 PM

## 2021-09-24 NOTE — ED Triage Notes (Addendum)
Pt arrived via [facility] from home.   Pt asking to go be placed in a behavioral health facility. Reports not sleeping well the past 2 weeks and does not think he is on the right medications.   Pt reports he has been out of anxiety meds. Pt ran out today and feeling like his is going to have a panic attack.   Reports he took some meds today.   Pt reports he is out of several medications.  Requesting a nicotine patch as well.    A/Ox4 Ambulatory in triage.

## 2021-09-26 ENCOUNTER — Other Ambulatory Visit: Payer: Self-pay

## 2021-09-26 ENCOUNTER — Emergency Department (HOSPITAL_COMMUNITY)
Admission: EM | Admit: 2021-09-26 | Discharge: 2021-09-27 | Disposition: A | Discharge status: Other Acute Inpatient Hospital | Payer: No Typology Code available for payment source | Attending: Emergency Medicine | Admitting: Emergency Medicine

## 2021-09-26 ENCOUNTER — Encounter (HOSPITAL_COMMUNITY): Payer: Self-pay

## 2021-09-26 DIAGNOSIS — Z20822 Contact with and (suspected) exposure to covid-19: Secondary | ICD-10-CM | POA: Insufficient documentation

## 2021-09-26 DIAGNOSIS — Y900 Blood alcohol level of less than 20 mg/100 ml: Secondary | ICD-10-CM | POA: Insufficient documentation

## 2021-09-26 DIAGNOSIS — F310 Bipolar disorder, current episode hypomanic: Secondary | ICD-10-CM

## 2021-09-26 DIAGNOSIS — F3113 Bipolar disorder, current episode manic without psychotic features, severe: Secondary | ICD-10-CM

## 2021-09-26 DIAGNOSIS — F4310 Post-traumatic stress disorder, unspecified: Secondary | ICD-10-CM

## 2021-09-26 DIAGNOSIS — R45851 Suicidal ideations: Secondary | ICD-10-CM

## 2021-09-26 LAB — RAPID URINE DRUG SCREEN, HOSP PERFORMED
Amphetamines: NOT DETECTED
Barbiturates: NOT DETECTED
Benzodiazepines: NOT DETECTED
Cocaine: NOT DETECTED
Opiates: NOT DETECTED
Tetrahydrocannabinol: POSITIVE — AB

## 2021-09-26 LAB — CBC WITH DIFFERENTIAL/PLATELET
Abs Immature Granulocytes: 0.03 10*3/uL (ref 0.00–0.07)
Basophils Absolute: 0.1 10*3/uL (ref 0.0–0.1)
Basophils Relative: 1 %
Eosinophils Absolute: 0 10*3/uL (ref 0.0–0.5)
Eosinophils Relative: 0 %
HCT: 46.7 % (ref 39.0–52.0)
Hemoglobin: 16.2 g/dL (ref 13.0–17.0)
Immature Granulocytes: 0 %
Lymphocytes Relative: 22 %
Lymphs Abs: 2.1 10*3/uL (ref 0.7–4.0)
MCH: 30.5 pg (ref 26.0–34.0)
MCHC: 34.7 g/dL (ref 30.0–36.0)
MCV: 87.8 fL (ref 80.0–100.0)
Monocytes Absolute: 0.7 10*3/uL (ref 0.1–1.0)
Monocytes Relative: 8 %
Neutro Abs: 6.5 10*3/uL (ref 1.7–7.7)
Neutrophils Relative %: 69 %
Platelets: 283 10*3/uL (ref 150–400)
RBC: 5.32 MIL/uL (ref 4.22–5.81)
RDW: 12.9 % (ref 11.5–15.5)
WBC: 9.4 10*3/uL (ref 4.0–10.5)
nRBC: 0 % (ref 0.0–0.2)

## 2021-09-26 LAB — COMPREHENSIVE METABOLIC PANEL
ALT: 35 U/L (ref 0–44)
AST: 61 U/L — ABNORMAL HIGH (ref 15–41)
Albumin: 5 g/dL (ref 3.5–5.0)
Alkaline Phosphatase: 48 U/L (ref 38–126)
Anion gap: 12 (ref 5–15)
BUN: 11 mg/dL (ref 6–20)
CO2: 23 mmol/L (ref 22–32)
Calcium: 9.5 mg/dL (ref 8.9–10.3)
Chloride: 102 mmol/L (ref 98–111)
Creatinine, Ser: 1.09 mg/dL (ref 0.61–1.24)
GFR, Estimated: >60 mL/min (ref >60)
Glucose, Bld: 102 mg/dL — ABNORMAL HIGH (ref 70–99)
Potassium: 4 mmol/L (ref 3.5–5.1)
Sodium: 137 mmol/L (ref 135–145)
Total Bilirubin: 1.1 mg/dL (ref 0.3–1.2)
Total Protein: 7.8 g/dL (ref 6.5–8.1)

## 2021-09-26 LAB — RESP PANEL BY RT-PCR (FLU A&B, COVID) ARPGX2
Influenza A by PCR: NEGATIVE
Influenza B by PCR: NEGATIVE
SARS Coronavirus 2 by RT PCR: NEGATIVE

## 2021-09-26 LAB — ETHANOL: Alcohol, Ethyl (B): <10 mg/dL (ref <10)

## 2021-09-26 MED ORDER — TRAZODONE HCL 50 MG PO TABS
50.0000 mg | ORAL_TABLET | Freq: Every day | ORAL | Status: DC
  Administered 2021-09-26 – 2021-09-27 (×2): 50 mg via ORAL
  Filled 2021-09-26 (×2): qty 1

## 2021-09-26 MED ORDER — ZIPRASIDONE MESYLATE 20 MG IM SOLR: 20.0000 mg | Freq: Once | INTRAMUSCULAR | Status: DC

## 2021-09-26 MED ORDER — NICOTINE 14 MG/24HR TD PT24
14.0000 mg | MEDICATED_PATCH | Freq: Every day | TRANSDERMAL | Status: DC
Start: 2021-09-26 — End: 2021-09-27
  Administered 2021-09-27: 14 mg via TRANSDERMAL
  Filled 2021-09-26: qty 1

## 2021-09-26 MED ORDER — LORAZEPAM 1 MG PO TABS
2.0000 mg | ORAL_TABLET | Freq: Once | ORAL | Status: AC
  Administered 2021-09-26: 2 mg via ORAL
  Filled 2021-09-26: qty 2

## 2021-09-26 MED ORDER — OLANZAPINE 5 MG PO TABS
15.0000 mg | ORAL_TABLET | Freq: Every day | ORAL | Status: DC
  Administered 2021-09-26 – 2021-09-27 (×2): 15 mg via ORAL
  Filled 2021-09-26 (×2): qty 1

## 2021-09-26 MED ORDER — NICOTINE 7 MG/24HR TD PT24
7.0000 mg | MEDICATED_PATCH | Freq: Once | TRANSDERMAL | Status: AC
  Administered 2021-09-26: 7 mg via TRANSDERMAL
  Filled 2021-09-26: qty 1

## 2021-09-26 MED ORDER — IBUPROFEN 200 MG PO TABS
400.0000 mg | ORAL_TABLET | Freq: Every evening | ORAL | Status: DC | PRN
Start: 2021-09-26 — End: 2021-09-27

## 2021-09-26 MED ORDER — DIVALPROEX SODIUM 500 MG PO DR TAB
750.0000 mg | DELAYED_RELEASE_TABLET | Freq: Two times a day (BID) | ORAL | Status: DC
  Administered 2021-09-26 – 2021-09-27 (×3): 750 mg via ORAL
  Filled 2021-09-26 (×3): qty 1

## 2021-09-26 MED ORDER — MELATONIN 5 MG PO TABS
10.0000 mg | ORAL_TABLET | Freq: Every day | ORAL | Status: DC
  Administered 2021-09-26 – 2021-09-27 (×2): 10 mg via ORAL
  Filled 2021-09-26 (×2): qty 2

## 2021-09-26 NOTE — ED Provider Triage Note (Addendum)
Emergency Medicine Provider Triage Evaluation Note  Ronald [name] , a 41 y.o. male  was evaluated in triage.  Pt complains of bipolar schizophrenia, requesting med help for stabilization. On meds x 3 months, dc from [facility] in [place] last month/1.5 months ago. Was started on his current regimen at that time.  Denies SI/HI, drug or etoh use. Review of Systems  Positive: Panic attacks Negative:   Physical Exam  BP (!) 149/116   Pulse 78   Temp 98.5 F (36.9 C) (Oral)   Resp 18   Ht 6' (1.829 m)   Wt 83.9 kg   SpO2 93%   BMI 25.09 kg/m  Gen:   Awake, no distress   Resp:  Normal effort  MSK:   Moves extremities without difficulty  Other:    Medical Decision Making  Medically screening exam initiated at 10:55 AM.  Appropriate orders placed.  [name] was informed that the remainder of the evaluation will be completed by another provider, this initial triage assessment does not replace that evaluation, and the importance of remaining in the ED until their evaluation is complete.     Jeannie Fend, PA-C 09/26/21 1055    Jeannie Fend, PA-C 09/26/21 1056

## 2021-09-26 NOTE — ED Triage Notes (Signed)
Pt. Brought in by his mother for a psych evaluation. Pt. C/o having SI thoughts and thoughts of harming himself. Pt. States he has in the past been diagnosed with Bi-polar disorder, schizophrenia and PTSD.

## 2021-09-26 NOTE — ED Provider Notes (Signed)
[facility]-EMERGENCY DEPT Provider Note   CSN: [id number] Arrival date & time: 09/26/21  1044     History  Chief Complaint  Patient presents with   Psychiatric Evaluation    Ronald [name] is a 41 y.o. male.  HPI  41 year old male with a history of bipolar disorder presents to the emergency department with concern for need for psychiatric evaluation.  The patient has been out of his home medications for the past 5 days.  He has not been sleeping and been staying up all night with racing thoughts.  Last night, he began to have thoughts of suicide and had plans to cut his wrist but did not fully go through with it.  He has previously required inpatient hospitalizations for medical stabilization.  He he continues to endorse SI, denies any HI or recent drug use.  He denies any auditory or visual hallucinations.  Home Medications Prior to Admission medications   Medication Sig Start Date End Date Taking? Authorizing Provider  Cariprazine HCl (VRAYLAR) 4.5 MG CAPS Take 1 capsule by mouth at bedtime.    [provider]  divalproex (DEPAKOTE) 250 MG DR tablet Take 3 tablets (750 mg total) by mouth 2 (two) times daily. Patient taking differently: Take 250 mg by mouth 2 (two) times daily. 09/19/21   Janeece AgeeMorrow, Richard, NP  ibuprofen (ADVIL) 200 MG tablet Take 400 mg by mouth at bedtime as needed for headache (pain).    [provider]  Melatonin 10 MG CAPS Take 10 mg by mouth at bedtime. 12/23/16   Sherren MochaShaw, Eva N, MD  nicotine (NICODERM CQ - DOSED IN MG/24 HOURS) 14 mg/24hr patch Place 1 patch (14 mg total) onto the skin daily for 7 days. 09/24/21 10/01/21  Earney Navynuoha, Josephine C, NP  OLANZapine (ZYPREXA) 15 MG tablet Take 15 mg by mouth at bedtime.    [provider]  traZODone (DESYREL) 50 MG tablet Take 1 tablet (50 mg total) by mouth at bedtime for 7 days. 09/24/21 10/01/21  Earney Navynuoha, Josephine C, NP      Allergies    Hydroxyzine, Benadryl  [diphenhydramine], Other, and Penicillins    Review of Systems   Review of Systems  All other systems reviewed and are negative.   Physical Exam Updated Vital Signs BP (!) 149/116   Pulse 78   Temp 98.5 F (36.9 C) (Oral)   Resp 18   Ht 6' (1.829 m)   Wt 83.9 kg   SpO2 93%   BMI 25.09 kg/m  Physical Exam Vitals and nursing note reviewed.  Constitutional:      General: He is not in acute distress.    Appearance: He is well-developed.  HENT:     Head: Normocephalic and atraumatic.  Eyes:     Conjunctiva/sclera: Conjunctivae normal.  Cardiovascular:     Rate and Rhythm: Normal rate and regular rhythm.  Pulmonary:     Effort: Pulmonary effort is normal. No respiratory distress.     Breath sounds: Normal breath sounds.  Abdominal:     Palpations: Abdomen is soft.     Tenderness: There is no abdominal tenderness.  Musculoskeletal:        General: No swelling.     Cervical back: Neck supple.  Skin:    General: Skin is warm and dry.     Capillary Refill: Capillary refill takes less than 2 seconds.  Neurological:     Mental Status: He is alert.  Psychiatric:  Attention and Perception: Attention and perception normal.        Mood and Affect: Mood is anxious.        Behavior: Behavior is agitated and hyperactive.        Thought Content: Thought content includes suicidal ideation. Thought content includes suicidal plan.     ED Results / Procedures / Treatments   Labs (all labs ordered are listed, but only abnormal results are displayed) Labs Reviewed  COMPREHENSIVE METABOLIC PANEL - Abnormal; Notable for the following components:      Result Value   Glucose, Bld 102 (*)    AST 61 (*)    All other components within normal limits  RESP PANEL BY RT-PCR (FLU A&B, COVID) ARPGX2  ETHANOL  CBC WITH DIFFERENTIAL/PLATELET  RAPID URINE DRUG SCREEN, HOSP PERFORMED  TSH  T4, FREE  CBG MONITORING, ED    EKG EKG Interpretation  Date/Time:  Wednesday September 26 2021  11:20:01 EDT Ventricular Rate:  71 PR Interval:  174 QRS Duration: 77 QT Interval:  354 QTC Calculation: 385 R Axis:   72 Text Interpretation: Sinus rhythm Benign early repolarization No significant change since last tracing Confirmed by Ernie Avena ([id number]) on 09/26/2021 11:23:14 AM  Radiology DG Chest 2 View  Result Date: 09/24/2021 CLINICAL DATA:  chest pain EXAM: CHEST - 2 VIEW COMPARISON:  Chest radiograph November 6 21. FINDINGS: No consolidation. No visible pleural effusions or pneumothorax. Cardiomediastinal silhouette is within normal limits. No acute osseous abnormality. IMPRESSION: No active cardiopulmonary disease. Electronically Signed   By: Feliberto Harts M.D.   On: 09/24/2021 13:40    Procedures Procedures    Medications Ordered in ED Medications  nicotine (NICODERM CQ - dosed in mg/24 hr) patch 7 mg (7 mg Transdermal Patch Applied 09/26/21 1154)  LORazepam (ATIVAN) tablet 2 mg (2 mg Oral Given 09/26/21 1154)    ED Course/ Medical Decision Making/ A&P                           Medical Decision Making Risk OTC drugs. Prescription drug management.   41 year old male with a history of bipolar disorder presents to the emergency department with concern for need for psychiatric evaluation.  The patient has been out of his home medications for the past 5 days.  He has not been sleeping and been staying up all night with racing thoughts.  Last night, he began to have thoughts of suicide and had plans to cut his wrist but did not fully go through with it.  He has previously required inpatient hospitalizations for medical stabilization.  He he continues to endorse SI, denies any HI or recent drug use.  He denies any auditory or visual hallucinations.  On arrival, the patient was vitally stable.  Concern for developing mania in the setting of being out of his home medications for the past 5 days.  Actively endorsing SI with a plan for cutting his wrists which she strongly  considered and states "I was with 3 with" last night.  Mom extremely concerned for his wellbeing and feels that he needs inpatient admission for stabilization.  While in the emergency department, the patient grew agitated at the thought of being placed back in the behavioral health holding area and attempted to flee.  He was verbally redirected and then accepted 2 mg p.o. Ativan for anxiousness.  Nicotene patch applied. IVC paperwork completed.  Concern for hypomania versus developing mania.  Medically clear for TTS  consultation.   Final Clinical Impression(s) / ED Diagnoses Final diagnoses:  Suicidal ideation  Bipolar affective disorder, current episode hypomanic [facility])    Rx / DC Orders ED Discharge Orders     None         Ernie Avena, MD 09/26/21 1157

## 2021-09-26 NOTE — ED Notes (Signed)
No belongings left in ED or with pt.  Pt's mother took all pt's belongings home w/ her.

## 2021-09-26 NOTE — BH Assessment (Signed)
Pt's IVC paperwork states:  "The patient has a hx of bipolar disorder, almost attempted suicide last night, concern for developing mania in the setting of being out of his medications for 5 days. Racing thoughts, no sleeping."  Dr. Kathrine Haddock, EDP/[facility]

## 2021-09-26 NOTE — ED Notes (Signed)
Pt changed into BH scrubs and hospital socks.  Pt wanded by security prior to being moved to [facility] room 41.  Pt ambulated w/ steady gait w/out need for assistance.  Pt cooperative and tearful at this time.

## 2021-09-27 ENCOUNTER — Inpatient Hospital Stay (HOSPITAL_COMMUNITY)
Admission: AD | Admit: 2021-09-27 | Discharge: 2021-10-01 | DRG: 885 | Disposition: A | Discharge status: Home / Self Care | Payer: No Typology Code available for payment source | Attending: Psychiatry | Admitting: Psychiatry

## 2021-09-27 DIAGNOSIS — F17220 Nicotine dependence, chewing tobacco, uncomplicated: Secondary | ICD-10-CM | POA: Diagnosis present

## 2021-09-27 DIAGNOSIS — F1210 Cannabis abuse, uncomplicated: Secondary | ICD-10-CM | POA: Diagnosis present

## 2021-09-27 DIAGNOSIS — Z91148 Patient's other noncompliance with medication regimen for other reason: Secondary | ICD-10-CM

## 2021-09-27 DIAGNOSIS — Z91138 Patient's unintentional underdosing of medication regimen for other reason: Secondary | ICD-10-CM | POA: Diagnosis not present

## 2021-09-27 DIAGNOSIS — K30 Functional dyspepsia: Secondary | ICD-10-CM | POA: Diagnosis present

## 2021-09-27 DIAGNOSIS — F3113 Bipolar disorder, current episode manic without psychotic features, severe: Secondary | ICD-10-CM

## 2021-09-27 DIAGNOSIS — F4310 Post-traumatic stress disorder, unspecified: Secondary | ICD-10-CM | POA: Diagnosis present

## 2021-09-27 DIAGNOSIS — Z880 Allergy status to penicillin: Secondary | ICD-10-CM

## 2021-09-27 DIAGNOSIS — M1990 Unspecified osteoarthritis, unspecified site: Secondary | ICD-10-CM | POA: Diagnosis present

## 2021-09-27 DIAGNOSIS — F319 Bipolar disorder, unspecified: Secondary | ICD-10-CM | POA: Diagnosis present

## 2021-09-27 DIAGNOSIS — Z79899 Other long term (current) drug therapy: Secondary | ICD-10-CM

## 2021-09-27 DIAGNOSIS — F0633 Mood disorder due to known physiological condition with manic features: Secondary | ICD-10-CM | POA: Diagnosis present

## 2021-09-27 DIAGNOSIS — T43596A Underdosing of other antipsychotics and neuroleptics, initial encounter: Secondary | ICD-10-CM | POA: Diagnosis present

## 2021-09-27 DIAGNOSIS — Z818 Family history of other mental and behavioral disorders: Secondary | ICD-10-CM | POA: Diagnosis not present

## 2021-09-27 DIAGNOSIS — F3112 Bipolar disorder, current episode manic without psychotic features, moderate: Secondary | ICD-10-CM | POA: Diagnosis not present

## 2021-09-27 DIAGNOSIS — G4700 Insomnia, unspecified: Secondary | ICD-10-CM | POA: Diagnosis present

## 2021-09-27 DIAGNOSIS — Z888 Allergy status to other drugs, medicaments and biological substances status: Secondary | ICD-10-CM

## 2021-09-27 MED ORDER — LORAZEPAM 1 MG PO TABS
1.0000 mg | ORAL_TABLET | Freq: Three times a day (TID) | ORAL | Status: DC | PRN
  Administered 2021-09-27: 1 mg via ORAL
  Filled 2021-09-27: qty 1

## 2021-09-27 MED ORDER — ALUM & MAG HYDROXIDE-SIMETH 200-200-20 MG/5ML PO SUSP: 30.0000 mL | ORAL | Status: DC | PRN

## 2021-09-27 MED ORDER — GABAPENTIN 300 MG PO CAPS
300.0000 mg | ORAL_CAPSULE | Freq: Three times a day (TID) | ORAL | Status: DC
  Administered 2021-09-27 (×3): 300 mg via ORAL
  Filled 2021-09-27 (×3): qty 1

## 2021-09-27 MED ORDER — ACETAMINOPHEN 325 MG PO TABS: 650.0000 mg | ORAL_TABLET | Freq: Four times a day (QID) | ORAL | Status: DC | PRN

## 2021-09-27 MED ORDER — MAGNESIUM HYDROXIDE 400 MG/5ML PO SUSP: 30.0000 mL | Freq: Every day | ORAL | Status: DC | PRN

## 2021-09-27 MED ORDER — NICOTINE POLACRILEX 2 MG MT GUM
2.0000 mg | CHEWING_GUM | OROMUCOSAL | Status: DC | PRN
  Administered 2021-09-27: 2 mg via ORAL
  Filled 2021-09-27: qty 1

## 2021-09-27 NOTE — ED Notes (Signed)
TTS on screen in room at this time.

## 2021-09-27 NOTE — Consult Note (Signed)
[facility] ED ASSESSMENT   Reason for Consult: To make treatment recommendations for worsening symptoms.  Referring Physician:  [facility].  Patient Identification: Ronald [name]  MRN:  [id number]  ED Chief Complaint: Bipolar disorder with severe mania (HCC)   Diagnosis:  Principal Problem:   Bipolar disorder with severe mania (HCC) Active Problems:   PTSD (post-traumatic stress disorder)   ED Assessment Time Calculation: No data recorded  Subjective:   Ronald [name] is a 41 y.o. Caucasian male with prior history of Bipolar disorder, cannabis use disorder & multiple psychiatric hospitalizations/treatments including outpatient psychiatric services. Patient was admitted at [facility] ED with complain/concerns for psychiatric evaluation & re-initiation of his mental health medication. Patient reported at the ED that he has not been taking his medications for 5 days because he ran out of them. Patient was apparently seeing a psychiatric provider by the name of [name] on an outpatient basis. During this evaluation, Lynden presents alert, a bit short tempered/verbally aggressive. He seem guarded, not forth-coming with information. He reports. "I have been at the ED x 1 day. I came in because I need my medicines for my mental health. I ran out of them 3 days ago. The medications are for my anxiety & PTSD. Just refill my medicines & send me out of here.  I have been on these medicines for a while. I see [name], but I'm getting another psychiatrist. I'm on Zyprexa 15 mg, Depakote 750 mg twice daily, Klonopin 1 mg once or twice prn, Trazodone 50 mg prn & Melatonin 10 mg at night". Damauri currently denies any SIHI, AVH, delusional thoughts or paranoia. He does not appear to be responding to any internal stimuli.He was involuntarily committed alt night for attempting to leave the hospital. The staff reports Ronald [name] displays periods of aggressive behaviors, but easily  calms down as well. Discussed this case with Dr. Lucianne Muss who agrees with the decision to recommend inpatient psychiatric admission for mood stabilization treatments.  Collateral information from patient's mother, [name] via telepsych: Ronald [name] lives with me. He is highly intelligent. Has a two separate undergraduate degrees & was studying law in a university in [place]. 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 Ronald [name] has never been the same since. I tried to get him into counseling, he was not compliant. Since 2012, Jadriel has been in & out of psychiatric hospitals ([facility], a psychiatric hospital in [place], [name], [facility] in [place] & a state hospital). His symptoms recycles every 6-7 weeks. Since February of this year, 2023, it appears his symptoms are getting worse & worse. At times, he will talk about aliens while his mood is bouncing up & down. I don't think that he is taking his medications as he should. He declines to go to counseling to deal with his brother's death. He recently took the Urn containing his brother's ashes & took off running. I had to run after him to get it back to the house.  Mayo uses the CBD stuff (C60) & smokes hemp. I think he needs help because he is not well".  HPI: (Reviewed & agreed with the EDP evaluation notes): 41 year old male with a history of bipolar disorder presents to the emergency department with concern for need for psychiatric evaluation.  The patient has been out of his home medications for the past 5 days.  He has not been  sleeping and been staying up all night with racing thoughts.  Last night, he began to have thoughts of suicide and had plans to cut his wrist but did not fully go through with it.  He has previously required inpatient hospitalizations for medical stabilization.  He he continues to endorse SI, denies any HI or recent drug use.  He denies any auditory or  visual hallucinations.  Past Psychiatric History: Bipolar disorder, manic episodes, Schizophrenia, Cannabis use disorder.  Risk to Self or Others: Is the patient at risk to self? No Has the patient been a risk to self in the past 6 months? No Has the patient been a risk to self within the distant past? Yes Is the patient a risk to others? No Has the patient been a risk to others in the past 6 months? No Has the patient been a risk to others within the distant past? No  [place] Scale:  [name] from 09/26/2021 in [facility] from 09/24/2021 in [facility] from 08/13/2021 in [facility]  C-SSRS RISK CATEGORY High Risk No Risk High Risk      AIMS:  , , ,  ,   ASAM:    Substance Abuse:     Past Medical History:  Past Medical History:  Diagnosis Date   Anxiety    Arthritis    Disorder of pineal gland    Post traumatic stress disorder     Past Surgical History:  Procedure Laterality Date   ANKLE SURGERY     Family History: History reviewed. No pertinent family history.  Family Psychiatric  History: Patient declines to provide this information.  Social History: Single, lives with mother. Social History   Substance and Sexual Activity  Alcohol Use No     Social History   Substance and Sexual Activity  Drug Use Yes   Types: Amphetamines, Benzodiazepines, Cocaine, Marijuana, Methamphetamines   Comment: one year ago    Social History   Socioeconomic History   Marital status: Single    Spouse name: Not on file   Number of children: Not on file   Years of education: Not on file   Highest education level: Not on file  Occupational History   Not on file  Tobacco Use   Smoking status: Never   Smokeless tobacco: Current    Types: Chew  Vaping Use   Vaping Use: Never used  Substance and Sexual Activity   Alcohol use: No   Drug use: Yes    Types:  Amphetamines, Benzodiazepines, Cocaine, Marijuana, Methamphetamines    Comment: one year ago   Sexual activity: Never  Other Topics Concern   Not on file  Social History Narrative   Not on file   Social Determinants of Health   Financial Resource Strain: Not on file  Food Insecurity: Not on file  Transportation Needs: Not on file  Physical Activity: Not on file  Stress: Not on file  Social Connections: Not on file   Additional Social History:  Allergies:   Allergies  Allergen Reactions   Hydroxyzine Anxiety    Other reaction(s): Other "hyper "   Benadryl [Diphenhydramine] Other (See Comments)    Pt states that this makes him feel insane   Other Other (See Comments)    Pt reports that all antihistamines make him feel insane   Penicillins Other (See Comments)    Pt states that this made him feel insane  Labs:  Results for orders placed or performed during the hospital encounter of 09/26/21 (from the past 48 hour(s))  Comprehensive metabolic panel     Status: Abnormal   Collection Time: 09/26/21 11:14 AM  Result Value Ref Range   Sodium 137 135 - 145 mmol/L   Potassium 4.0 3.5 - 5.1 mmol/L   Chloride 102 98 - 111 mmol/L   CO2 23 22 - 32 mmol/L   Glucose, Bld 102 (H) 70 - 99 mg/dL    Comment: Glucose reference range applies only to samples taken after fasting for at least 8 hours.   BUN 11 6 - 20 mg/dL   Creatinine, Ser 6.57 0.61 - 1.24 mg/dL   Calcium 9.5 8.9 - 84.6 mg/dL   Total Protein 7.8 6.5 - 8.1 g/dL   Albumin 5.0 3.5 - 5.0 g/dL   AST 61 (H) 15 - 41 U/L   ALT 35 0 - 44 U/L   Alkaline Phosphatase 48 38 - 126 U/L   Total Bilirubin 1.1 0.3 - 1.2 mg/dL   GFR, Estimated >96 >29 mL/min    Comment: (NOTE) Calculated using the CKD-EPI Creatinine Equation (2021)    Anion gap 12 5 - 15    Comment: Performed at [facility], [address] [place], [name] [address]  Ethanol     Status: None   Collection Time: 09/26/21 11:14 AM  Result Value  Ref Range   Alcohol, Ethyl (B) <10 <10 mg/dL    Comment: (NOTE) Lowest detectable limit for serum alcohol is 10 mg/dL.  For medical purposes only. Performed at [facility], [address], [place], [name] [address]   CBC with Diff     Status: None   Collection Time: 09/26/21 11:14 AM  Result Value Ref Range   WBC 9.4 4.0 - 10.5 K/uL   RBC 5.32 4.22 - 5.81 MIL/uL   Hemoglobin 16.2 13.0 - 17.0 g/dL   HCT 10.2 72.5 - 36.6 %   MCV 87.8 80.0 - 100.0 fL   MCH 30.5 26.0 - 34.0 pg   MCHC 34.7 30.0 - 36.0 g/dL   RDW 44.0 34.7 - 42.5 %   Platelets 283 150 - 400 K/uL   nRBC 0.0 0.0 - 0.2 %   Neutrophils Relative % 69 %   Neutro Abs 6.5 1.7 - 7.7 K/uL   Lymphocytes Relative 22 %   Lymphs Abs 2.1 0.7 - 4.0 K/uL   Monocytes Relative 8 %   Monocytes Absolute 0.7 0.1 - 1.0 K/uL   Eosinophils Relative 0 %   Eosinophils Absolute 0.0 0.0 - 0.5 K/uL   Basophils Relative 1 %   Basophils Absolute 0.1 0.0 - 0.1 K/uL   Immature Granulocytes 0 %   Abs Immature Granulocytes 0.03 0.00 - 0.07 K/uL    Comment: Performed at [facility], [address], [place], [name] [address]  Resp Panel by RT-PCR (Flu A&B, Covid) Anterior Nasal Swab     Status: None   Collection Time: 09/26/21 11:26 AM   Specimen: Anterior Nasal Swab  Result Value Ref Range   SARS Coronavirus 2 by RT PCR NEGATIVE NEGATIVE    Comment: (NOTE) SARS-CoV-2 target nucleic acids are NOT DETECTED.  The SARS-CoV-2 RNA is generally detectable in upper respiratory specimens during the acute phase of infection. The lowest concentration of SARS-CoV-2 viral copies this assay can detect is 138 copies/mL. A negative result does not preclude SARS-Cov-2 infection and should not be used as the  sole basis for treatment or other patient management decisions. A negative result may occur with  improper specimen collection/handling, submission of specimen other than nasopharyngeal swab, presence of viral  mutation(s) within the areas targeted by this assay, and inadequate number of viral copies(<138 copies/mL). A negative result must be combined with clinical observations, patient history, and epidemiological information. The expected result is Negative.  Fact Sheet for Patients:  BloggerCourse.comhttps://www.fda.gov/media/152166/download  Fact Sheet for Healthcare Providers:  SeriousBroker.ithttps://www.fda.gov/media/152162/download  This test is no t yet approved or cleared by the [place] FDA and  has been authorized for detection and/or diagnosis of SARS-CoV-2 by FDA under an Emergency Use Authorization (EUA). This EUA will remain  in effect (meaning this test can be used) for the duration of the COVID-19 declaration under Section 564(b)(1) of the Act, 21 U.S.C.section 360bbb-3(b)(1), unless the authorization is terminated  or revoked sooner.       Influenza A by PCR NEGATIVE NEGATIVE   Influenza B by PCR NEGATIVE NEGATIVE    Comment: (NOTE) The Xpert Xpress SARS-CoV-2/FLU/RSV plus assay is intended as an aid in the diagnosis of influenza from Nasopharyngeal swab specimens and should not be used as a sole basis for treatment. Nasal washings and aspirates are unacceptable for Xpert Xpress SARS-CoV-2/FLU/RSV testing.  Fact Sheet for Patients: BloggerCourse.comhttps://www.fda.gov/media/152166/download  Fact Sheet for Healthcare Providers: SeriousBroker.ithttps://www.fda.gov/media/152162/download  This test is not yet approved or cleared by the [place] FDA and has been authorized for detection and/or diagnosis of SARS-CoV-2 by FDA under an Emergency Use Authorization (EUA). This EUA will remain in effect (meaning this test can be used) for the duration of the COVID-19 declaration under Section 564(b)(1) of the Act, 21 U.S.C. section 360bbb-3(b)(1), unless the authorization is terminated or revoked.  Performed at [facility], [address] [place], [place] [address]   Urine rapid drug screen (hosp  performed)     Status: Abnormal   Collection Time: 09/26/21 11:39 AM  Result Value Ref Range   Opiates NONE DETECTED NONE DETECTED   Cocaine NONE DETECTED NONE DETECTED   Benzodiazepines NONE DETECTED NONE DETECTED   Amphetamines NONE DETECTED NONE DETECTED   Tetrahydrocannabinol POSITIVE (A) NONE DETECTED   Barbiturates NONE DETECTED NONE DETECTED    Comment: (NOTE) DRUG SCREEN FOR MEDICAL PURPOSES ONLY.  IF CONFIRMATION IS NEEDED FOR ANY PURPOSE, NOTIFY LAB WITHIN 5 DAYS.  LOWEST DETECTABLE LIMITS FOR URINE DRUG SCREEN Drug Class                     Cutoff (ng/mL) Amphetamine and metabolites    1000 Barbiturate and metabolites    200 Benzodiazepine                 200 Tricyclics and metabolites     300 Opiates and metabolites        300 Cocaine and metabolites        300 THC                            50 Performed at [facility], [address] [place], [place] [address]    Current Facility-Administered Medications  Medication Dose Route Frequency Provider Last Rate Last Admin   divalproex (DEPAKOTE) DR tablet 750 mg  750 mg Oral BID Milagros Lollykstra, Richard S, MD   750 mg at 09/27/21 [id number]   gabapentin (NEURONTIN) capsule 300 mg  300 mg Oral TID PC & HS Sanjuana KavaNwoko, Tamaiya Bump I,  NP   300 mg at 09/27/21 1404   ibuprofen (ADVIL) tablet 400 mg  400 mg Oral QHS PRN Milagros Loll, MD       LORazepam (ATIVAN) tablet 1 mg  1 mg Oral TID PRN Armandina Stammer I, NP   1 mg at 09/27/21 1404   melatonin tablet 10 mg  10 mg Oral QHS Milagros Loll, MD   10 mg at 09/26/21 2018   nicotine (NICODERM CQ - dosed in mg/24 hours) patch 14 mg  14 mg Transdermal Daily Milagros Loll, MD   14 mg at 09/27/21 0928   OLANZapine (ZYPREXA) tablet 15 mg  15 mg Oral QHS Milagros Loll, MD   15 mg at 09/26/21 2016   traZODone (DESYREL) tablet 50 mg  50 mg Oral QHS Milagros Loll, MD   50 mg at 09/26/21 2017   Current Outpatient Medications  Medication Sig Dispense Refill   divalproex  (DEPAKOTE) 250 MG DR tablet Take 3 tablets (750 mg total) by mouth 2 (two) times daily. 270 tablet 1   ibuprofen (ADVIL) 200 MG tablet Take 400 mg by mouth at bedtime as needed for headache (pain).     Melatonin 10 MG CAPS Take 10 mg by mouth at bedtime.     OLANZapine (ZYPREXA) 15 MG tablet Take 15 mg by mouth at bedtime.     traZODone (DESYREL) 50 MG tablet Take 1 tablet (50 mg total) by mouth at bedtime for 7 days. 7 tablet 0   Cariprazine HCl (VRAYLAR) 4.5 MG CAPS Take 1 capsule by mouth at bedtime.     nicotine (NICODERM CQ - DOSED IN MG/24 HOURS) 14 mg/24hr patch Place 1 patch (14 mg total) onto the skin daily for 7 days. 7 patch 0   Musculoskeletal: Strength & Muscle Tone: within normal limits Gait & Station: normal Patient leans: N/A   Psychiatric Specialty Exam: Presentation  General Appearance: Casual; Fairly Groomed  Eye Contact:Good  Speech:Clear and Coherent; Normal Rate  Speech Volume:Normal  Handedness:Right  Mood and Affect  Mood:Labile; Irritable  Affect:Congruent  Thought Process  Thought Processes:Coherent; Goal Directed  Descriptions of Associations:Intact  Orientation:Full (Time, Place and Person)  Thought Content:Logical  History of Schizophrenia/Schizoaffective disorder:Yes  Duration of Psychotic Symptoms:Greater than six months  Hallucinations:Hallucinations: None Description of Auditory Hallucinations: None reported  Ideas of Reference:None  Suicidal Thoughts:Suicidal Thoughts: No  Homicidal Thoughts:Homicidal Thoughts: No   Sensorium  Memory:Immediate Good; Recent Good; Remote Good  Judgment:Impaired  Insight:Fair  Executive Functions  Concentration:Fair  Attention Span:Fair  Recall:Good  Fund of Knowledge:Fair  Language:Good  Psychomotor Activity  Psychomotor Activity:Psychomotor Activity: Normal   Assets  Assets:Communication Skills; Desire for Improvement; Housing; Social Support; Physical Health  Sleep   Sleep:Sleep: Fair Number of Hours of Sleep: 5.5   Physical Exam: Physical Exam Vitals and nursing note reviewed.  HENT:     Mouth/Throat:     Pharynx: Oropharynx is clear.  Cardiovascular:     Rate and Rhythm: Normal rate.     Pulses: Normal pulses.  Pulmonary:     Effort: Pulmonary effort is normal.  Genitourinary:    Comments: Deferred Musculoskeletal:        General: Normal range of motion.     Cervical back: Normal range of motion.  Skin:    General: Skin is warm.  Neurological:     General: No focal deficit present.     Mental Status: He is alert and oriented to person, place, and time.  Review of Systems  Constitutional:  Negative for chills, diaphoresis and fever.  HENT:  Negative for congestion and sore throat.   Respiratory:  Negative for cough, shortness of breath and wheezing.   Cardiovascular:  Negative for chest pain and palpitations.  Gastrointestinal:  Negative for abdominal pain, constipation, diarrhea, heartburn, nausea and vomiting.  Neurological:  Negative for dizziness, tingling, tremors, sensory change, speech change, focal weakness, seizures, loss of consciousness, weakness and headaches.  Endo/Heme/Allergies:        Benadryl, Hydroxyzine, PCN  Psychiatric/Behavioral:  Positive for substance abuse (THC use disorder.). Negative for depression, hallucinations, memory loss and suicidal ideas. The patient is nervous/anxious and has insomnia.    Blood pressure 128/70, pulse 77, temperature 98 F (36.7 C), temperature source Oral, resp. rate 19, height 6' (1.829 m), weight 83.9 kg, SpO2 97 %. Body mass index is 25.09 kg/m.  Medical Decision Making: Recommended for inpatient admission (Mother prefers a psychiatric hospital in [place] that is affiliated with [facility] or [facility]).  Problem 1: Bipolar disorder, current episode, manic.  Problem 2: Cannabis use disorder.  Problem 3: NA  Disposition: Recommend psychiatric Inpatient  admission when medically cleared.  Armandina Stammer, NP, pmhnp, fnp-bc. 09/27/2021 2:58 PM

## 2021-09-27 NOTE — BH Assessment (Signed)
Clinician made contact with pt's nurse, Morrell Riddle RN, to determine if pt can participate in his MH Assessment. Pt's nurse shared pt was given his night-time medication and is asleep and, thus, unable to participate in his MH Assessment at this time. TTS to attempt assessment later once pt is awake and coherent.

## 2021-09-27 NOTE — BH Assessment (Addendum)
Aggie NP to see and assess. No TTS CCA needed at this time per Dr. Lucianne Muss in 9 am meeting today.

## 2021-09-27 NOTE — ED Notes (Signed)
Pt's mother left at this time. Cell [phone]

## 2021-09-27 NOTE — ED Notes (Signed)
Gave report to Kearney Hard, Charity fundraiser at [facility]. Left number in case she had additional questions. Patient will be transported by [facility] d/t being IVC'd to bed 403-[facility].   Called Sheriff to pick up patient by 2200 to take to  [facility].

## 2021-09-27 NOTE — ED Notes (Signed)
Let Kearney Hard, RN at [facility] know that [facility] was enroute to bring patient there.

## 2021-09-27 NOTE — ED Provider Notes (Signed)
Emergency Medicine Observation Re-evaluation Note  KETHAN PAPADOPOULOS is a 41 y.o. male, seen on rounds today.  Pt initially presented to the ED for complaints of Psychiatric Evaluation Currently, the patient is resting.  Physical Exam  BP 108/66 (BP Location: Left Arm)   Pulse 70   Temp 98.2 F (36.8 C) (Oral)   Resp 18   Ht 1.829 m (6')   Wt 83.9 kg   SpO2 96%   BMI 25.09 kg/m  Physical Exam General: Sleeping Cardiac: Regular rate Lungs: Breathing easily Psych: Deferred  ED Course / MDM  EKG:EKG Interpretation  Date/Time:  Wednesday September 26 2021 11:20:01 EDT Ventricular Rate:  71 PR Interval:  174 QRS Duration: 77 QT Interval:  354 QTC Calculation: 385 R Axis:   72 Text Interpretation: Sinus rhythm Benign early repolarization No significant change since last tracing Confirmed by Ernie Avena ([id number]) on 09/26/2021 11:23:14 AM  I have reviewed the labs performed to date as well as medications administered while in observation.  Recent changes in the last 24 hours include no acute changes.  Plan  Current plan is for initial psychiatric assessment. [name] is under involuntary commitment.      Linwood Dibbles, MD 09/27/21 [phone]

## 2021-09-27 NOTE — BH Assessment (Signed)
Clinician made contact with pt's nurse, Morrell Riddle RN, to determine if he would be able to participate in his MH Assessment. Pt's nurse stated pt continues to be asleep at this time. TTS to attempt assessment at a later time.

## 2021-09-27 NOTE — BH Assessment (Signed)
[facility] Assessment Progress Note   Per Serena Colonel, NP, this pt requires psychiatric hospitalization.  Danika, RN, [facility] has assigned pt to [facility] Rm 403-1 to the service of Dr Abbott Pao.  [facility] will be ready to receive pt at 22:00.  Pt presents under IVC initiated by EDP Ernie Avena, MD, and IVC documents have been faxed to [facility].  EDP Linwood Dibbles, MD and pt's nurse, [place], have been notified, and [place] agrees to call report to [phone].  Pt is to be transported via Patent [employer].   Doylene Canning, [name] Behavioral Health Coordinator [phone]

## 2021-09-27 NOTE — ED Notes (Signed)
Pt mother at bedside visiting at this time.

## 2021-09-28 ENCOUNTER — Encounter (HOSPITAL_COMMUNITY): Payer: Self-pay | Admitting: Psychiatry

## 2021-09-28 ENCOUNTER — Encounter (HOSPITAL_COMMUNITY): Payer: Self-pay

## 2021-09-28 MED ORDER — NICOTINE POLACRILEX 2 MG MT GUM
CHEWING_GUM | OROMUCOSAL | Status: AC
  Filled 2021-09-28: qty 1

## 2021-09-28 MED ORDER — NICOTINE 14 MG/24HR TD PT24
MEDICATED_PATCH | TRANSDERMAL | Status: AC
  Filled 2021-09-28: qty 1

## 2021-09-28 MED ORDER — MELATONIN 5 MG PO TABS
10.0000 mg | ORAL_TABLET | Freq: Every day | ORAL | Status: DC
  Administered 2021-09-28 – 2021-09-30 (×3): 10 mg via ORAL
  Filled 2021-09-28 (×4): qty 2

## 2021-09-28 MED ORDER — IBUPROFEN 400 MG PO TABS: 400.0000 mg | ORAL_TABLET | Freq: Every evening | ORAL | Status: DC | PRN

## 2021-09-28 MED ORDER — TRAZODONE HCL 50 MG PO TABS
50.0000 mg | ORAL_TABLET | Freq: Every day | ORAL | Status: DC
  Administered 2021-09-28 – 2021-09-30 (×3): 50 mg via ORAL
  Filled 2021-09-28 (×4): qty 1

## 2021-09-28 MED ORDER — ADULT MULTIVITAMIN W/MINERALS CH
1.0000 | ORAL_TABLET | Freq: Every day | ORAL | Status: DC
  Administered 2021-09-28 – 2021-10-01 (×4): 1 via ORAL
  Filled 2021-09-28 (×7): qty 1

## 2021-09-28 MED ORDER — OLANZAPINE 7.5 MG PO TABS
15.0000 mg | ORAL_TABLET | Freq: Every day | ORAL | Status: DC
  Administered 2021-09-28 – 2021-09-30 (×3): 15 mg via ORAL
  Filled 2021-09-28 (×4): qty 2

## 2021-09-28 MED ORDER — DIVALPROEX SODIUM 500 MG PO DR TAB
750.0000 mg | DELAYED_RELEASE_TABLET | Freq: Two times a day (BID) | ORAL | Status: DC
  Administered 2021-09-28 – 2021-10-01 (×7): 750 mg via ORAL
  Filled 2021-09-28 (×10): qty 1

## 2021-09-28 MED ORDER — GABAPENTIN 100 MG PO CAPS
100.0000 mg | ORAL_CAPSULE | Freq: Three times a day (TID) | ORAL | Status: DC
  Filled 2021-09-28 (×4): qty 1

## 2021-09-28 MED ORDER — GABAPENTIN 300 MG PO CAPS
300.0000 mg | ORAL_CAPSULE | Freq: Three times a day (TID) | ORAL | Status: DC
  Administered 2021-09-28 – 2021-10-01 (×11): 300 mg via ORAL
  Filled 2021-09-28 (×17): qty 1

## 2021-09-28 MED ORDER — LORAZEPAM 1 MG PO TABS: 1.0000 mg | ORAL_TABLET | Freq: Three times a day (TID) | ORAL | Status: DC | PRN

## 2021-09-28 MED ORDER — NICOTINE 14 MG/24HR TD PT24
14.0000 mg | MEDICATED_PATCH | Freq: Every day | TRANSDERMAL | Status: DC
Start: 2021-09-28 — End: 2021-09-30
  Administered 2021-09-28 – 2021-09-29 (×2): 14 mg via TRANSDERMAL
  Filled 2021-09-28 (×4): qty 1

## 2021-09-28 NOTE — Group Note (Signed)
LCSW Group Therapy Note   Group Date: 09/28/2021 Start Time: 1300 End Time: 1400  Type of Therapy and Topic:  Group Therapy - Healthy vs Unhealthy Coping Skills  Participation Level:  Active   Description of Group The focus of this group was to determine what unhealthy coping techniques typically are used by group members and what healthy coping techniques would be helpful in coping with various problems. Patients were guided in becoming aware of the differences between healthy and unhealthy coping techniques. Patients were asked to identify 2-3 healthy coping skills they would like to learn to use more effectively.  Therapeutic Goals Patients learned that coping is what human beings do all day long to deal with various situations in their lives Patients defined and discussed healthy vs unhealthy coping techniques Patients identified their preferred coping techniques and identified whether these were healthy or unhealthy Patients determined 2-3 healthy coping skills they would like to become more familiar with and use more often. Patients provided support and ideas to each other   Summary of Patient Progress:  During group, patient expressed definition and understanding of what it means to cope. Pt actively engaged in identifying unhealthy coping mechanisms they have utilized in the past. Pt actively engaged in processing means of coping and what outcomes occur from such methods. Pt further engaged in discussion, identifying healthy coping mechanisms they have used in the past.  Pt engaged in processing the use of healthier mechanisms and how these produce different gains to unhealthy mechanisms. Pt actively identified other coping mechanisms they would be willing to try in the future.  Pt proved receptive of alternate group members input and feedback from CSW.   Therapeutic Modalities Cognitive Behavioral Therapy Motivational Interviewing  Teaira Croft M Ladavion Savitz, LCSWA 09/28/2021  1:51 PM    

## 2021-09-28 NOTE — Group Note (Signed)
Recreation Therapy Group Note   Group Topic:Stress Management  Group Date: 09/28/2021 Start Time: 0930 End Time: 0950 Facilitators: Caroll Rancher, LRT,CTRS Location: [address] Dayroom   Goal Area(s) Addresses:  Patient will actively participate in stress management techniques presented during session.  Patient will successfully identify benefit of practicing stress management post d/c.   Group Description:  Guided Imagery. LRT provided education, instruction, and demonstration on practice of visualization via guided imagery. Patient was asked to participate in the technique introduced during session. LRT debriefed including topics of mindfulness, stress management and specific scenarios each patient could use these techniques. Patients were given suggestions of ways to access scripts post d/c and encouraged to explore Youtube and other apps available on smartphones, tablets, and computers.   Affect/Mood: Appropriate   Participation Level: Active   Participation Quality: Independent   Behavior: Appropriate   Speech/Thought Process: Focused   Insight: Good   Judgement: Good   Modes of Intervention: Script; Nature Sounds   Patient Response to Interventions:  Engaged   Education Outcome:  Acknowledges education and In group clarification offered    Clinical Observations/Individualized Feedback: Pt attended and participated in group session.     Plan: Continue to engage patient in RT group sessions 2-3x/week.   Caroll Rancher, Antonietta Jewel  09/28/2021 12:49 PM

## 2021-09-28 NOTE — H&P (Signed)
Psychiatric Admission Assessment Adult  Patient Identification: JAQUILLE KAU  MRN:  [id number]  Date of Evaluation:  09/28/2021  Chief Complaint: Seeking medication refills due to worsening symptoms.  Principal Diagnosis: Bipolar 1 disorder with moderate mania (HCC)  Diagnosis:  Principal Problem:   Bipolar 1 disorder with moderate mania (HCC) Active Problems:   Bipolar affective disorder, currently active [facility])  History of Present Illness: (Per psychiatric consult notes by this provider): Ronald [name] is a 41 y.o. Caucasian male with prior history of Bipolar disorder, cannabis use disorder & multiple psychiatric hospitalizations/treatments including outpatient psychiatric services. Patient was admitted at [facility] ED with complain/concerns for psychiatric evaluation & re-initiation of his mental health medication. Patient reported at the ED that he has not been taking his medications for 5 days because he ran out of them. Patient was apparently seeing a psychiatric provider by the name of [name] on an outpatient basis. During this evaluation, Romel presents alert, a bit short tempered/verbally aggressive. He seem guarded, not forth-coming with information. He reports. "I have been at the ED x 1 day. I came in because I need my medicines for my mental health. I ran out of them 3 days ago. The medications are for my anxiety & PTSD. Just refill my medicines & send me out of here.  I have been on these medicines for a while. I see [name], but I'm getting another psychiatrist. I'm on Zyprexa 15 mg, Depakote 750 mg twice daily, Klonopin 1 mg once or twice prn, Trazodone 50 mg prn & Melatonin 10 mg at night". Khaleef currently denies any SIHI, AVH, delusional thoughts or paranoia. He does not appear to be responding to any internal stimuli.He was involuntarily committed alt night for attempting to leave the hospital. The staff reports Avondre displays periods of aggressive  behaviors, but easily calms down as well. Discussed this case with Dr. Lucianne Muss who agrees with the decision to recommend inpatient psychiatric admission for mood stabilization treatments.  Today during this inpatient psychiatric admission, Tashiro presents alert, oriented x 3 & aware of situation. He is making a good eye contact & verbally responsive. He presents as a good historian. His reports from the consult notes above has not changed. He seems pleased to be in the hospital as he feels he needed to get his medications re-started again. He says he went from 3-5 days without taking his mental health medications. He says he had a disagreement with his outpatient psychiatric provider as a result was unable to get his medication refills. Mohrmann adds because he battles high anxiety levels & was on Klonopin that was no longer available, he resorted to using CBD oils & smokes hemp to help with his anxiety. He says at times, his mother would offer him gabapentin for his anxiety which seems to help. He also reports having difficulties sleeping at night. However, says he slept well last night. He currently denies any SIHI, AVH, delusional thoughts or paranoia. He does not appear to be responding to any internal stimuli. He rates his depression #0 & anxiety #5. And because Raeshaun continues to express that he does suffer anxiety problems & all that he had to do curve his anxiety, he is started on gabapentin 300 mg qid with his consent. He is resumed on Depakote ER 750 mg bid, Olanzapine 15 mg Q hs, Melatonin 10 mg Q hs. Will obtain Valproic acid, hgba1c, TSH, U/A & CMP. He is requesting counseling services & a new psychiatric  provider after discharge.   Collateral information from patient's mother, [name] via telepsych: [name] lives with me. He is highly intelligent. Has a two separate undergraduate degrees & was studying law in a university in [place]. 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 Tyler has never been the same since. I tried to get him into counseling, he was not compliant. Since 2012, Elba has been in & out of psychiatric hospitals ([facility], a psychiatric hospital in [place], [name], [facility] in [place] & a state hospital). His symptoms recycles every 6-7 weeks. Since February of this year, 2023, it appears his symptoms are getting worse & worse. At times, he will talk about aliens while his mood is bouncing up & down. I don't think that he is taking his medications as he should. He declines to go to counseling to deal with his brother's death. He recently took the Urn containing his brother's ashes & took off running. I had to run after him to get it back to the house.  Damin uses the CBD stuff (C60) & smokes hemp. I think he needs help because he is not well".  Associated Signs/Symptoms:  Depression Symptoms:  insomnia, psychomotor agitation, anxiety, (Per patient's report, all these symptoms were present upon admission to the ED. There were the reasons that made him sort treatment.  Duration of Depression Symptoms: Less than two weeks  (Hypo) Manic Symptoms:  Impulsivity, Irritable Mood, Labiality of Mood,  Anxiety Symptoms:  Excessive Worry,  Psychotic Symptoms:   Denies any hallucinations, delusions or paranoia. Patient does not appear to be responding to any internal stimuli.  PTSD Symptoms: Had a traumatic exposure:  (When 41 year old younger brother was robbed & murdered in 2011).  Total Time spent with patient: 1 hour  Past Psychiatric History: Bipolar disorder, Schizophrenia, PTSD  Is the patient at risk to self? No.  Has the patient been a risk to self in the past 6 months? No.  Has the patient been a risk to self within the distant past? Yes.    Is the patient a risk to others? No.  Has the patient been a risk to others in the past 6 months? Yes.    Has the patient been a risk  to others within the distant past? Yes.     Prior Inpatient Therapy: Yes, [facility], [facility], [facility], A psychiatric hospital in [place], [name].  Prior Outpatient Therapy: Yes with [name].  Alcohol Screening: 1. How often do you have a drink containing alcohol?: Never 2. How many drinks containing alcohol do you have on a typical day when you are drinking?: 1 or 2 3. How often do you have six or more drinks on one occasion?: Never AUDIT-C Score: 0 4. How often during the last year have you found that you were not able to stop drinking once you had started?: Never 5. How often during the last year have you failed to do what was normally expected from you because of drinking?: Never 6. How often during the last year have you needed a first drink in the morning to get yourself going after a heavy drinking session?: Never 7. How often during the last year have you had a feeling of guilt of remorse after drinking?: Never 8. How often during the last year have you been unable to remember what happened the night before because  you had been drinking?: Never 9. Have you or someone else been injured as a result of your drinking?: No 10. Has a relative or friend or a doctor or another health worker been concerned about your drinking or suggested you cut down?: No Alcohol Use Disorder Identification Test Final Score (AUDIT): 0  Substance Abuse History in the last 12 months:  Yes.    Consequences of Substance Abuse: Discussed with patient during this admission evaluation. Medical Consequences:  Liver damage, Possible death by overdose Legal Consequences:  Arrests, jail time, Loss of driving privilege. Family Consequences:  Family discord, divorce and or separation.   Previous Psychotropic Medications:  Yes, Vraylar, Olanzapine,   Psychological Evaluations: No   Past Medical History:  Past Medical History:  Diagnosis Date   Anxiety    Arthritis     Disorder of pineal gland    Post traumatic stress disorder     Past Surgical History:  Procedure Laterality Date   ANKLE SURGERY     Family History: History reviewed. No pertinent family history.  Family Psychiatric  History: Anxiety disorder: Mother.  Tobacco Screening: Yes, chews tobacco.  Social History: Single, lives with mother in [place], [name], unemployed, has no children. Social History   Substance and Sexual Activity  Alcohol Use No     Social History   Substance and Sexual Activity  Drug Use Yes   Types: Amphetamines, Benzodiazepines, Cocaine, Marijuana, Methamphetamines   Comment: one year ago    Additional Social History:  Allergies:   Allergies  Allergen Reactions   Hydroxyzine Anxiety    Other reaction(s): Other "hyper "   Benadryl [Diphenhydramine] Other (See Comments)    Pt states that this makes him feel insane   Other Other (See Comments)    Pt reports that all antihistamines make him feel insane   Penicillins Other (See Comments)    Pt states that this made him feel insane   Lab Results:  No results found for this or any previous visit (from the past 48 hour(s)).  Blood Alcohol level:  Lab Results  Component Value Date   ETH <10 09/26/2021   ETH <10 09/24/2021   Metabolic Disorder Labs:  Lab Results  Component Value Date   HGBA1C 5.0 02/19/2020   MPG 96.8 02/19/2020   No results found for: "PROLACTIN" Lab Results  Component Value Date   CHOL 131 02/19/2020   TRIG 76 02/19/2020   HDL 61 02/19/2020   CHOLHDL 2.1 02/19/2020   VLDL 15 02/19/2020   LDLCALC 55 02/19/2020   Current Medications: Current Facility-Administered Medications  Medication Dose Route Frequency Provider Last Rate Last Admin   acetaminophen (TYLENOL) tablet 650 mg  650 mg Oral Q6H PRN Armandina Stammer I, NP       alum & mag hydroxide-simeth (MAALOX/MYLANTA) 200-200-20 MG/5ML suspension 30 mL  30 mL Oral Q4H PRN Geral Coker I, NP       divalproex (DEPAKOTE) DR  tablet 750 mg  750 mg Oral BID Armandina Stammer I, NP   750 mg at 09/28/21 1055   gabapentin (NEURONTIN) capsule 300 mg  300 mg Oral TID PC & HS Tarica Harl I, NP       ibuprofen (ADVIL) tablet 400 mg  400 mg Oral QHS PRN Dynastee Brummell I, NP       LORazepam (ATIVAN) tablet 1 mg  1 mg Oral TID PRN Armandina Stammer I, NP       magnesium hydroxide (MILK OF MAGNESIA) suspension 30 mL  30 mL Oral Daily PRN Armandina StammerNwoko, Windi Toro I, NP       melatonin tablet 10 mg  10 mg Oral QHS Treylen Gibbs I, NP       multivitamin with minerals tablet 1 tablet  1 tablet Oral Daily Tilly Pernice I, NP       nicotine (NICODERM CQ - dosed in mg/24 hours) 14 mg/24hr patch            nicotine (NICODERM CQ - dosed in mg/24 hours) patch 14 mg  14 mg Transdermal Daily Eliseo GumMcQuilla, Jai B, MD   14 mg at 09/28/21 0931   OLANZapine (ZYPREXA) tablet 15 mg  15 mg Oral QHS Aireona Torelli I, NP       traZODone (DESYREL) tablet 50 mg  50 mg Oral QHS Karlen Barbar I, NP       PTA Medications: Medications Prior to Admission  Medication Sig Dispense Refill Last Dose   Cariprazine HCl (VRAYLAR) 4.5 MG CAPS Take 1 capsule by mouth at bedtime.      divalproex (DEPAKOTE) 250 MG DR tablet Take 3 tablets (750 mg total) by mouth 2 (two) times daily. 270 tablet 1    ibuprofen (ADVIL) 200 MG tablet Take 400 mg by mouth at bedtime as needed for headache (pain).      Melatonin 10 MG CAPS Take 10 mg by mouth at bedtime.      nicotine (NICODERM CQ - DOSED IN MG/24 HOURS) 14 mg/24hr patch Place 1 patch (14 mg total) onto the skin daily for 7 days. 7 patch 0    OLANZapine (ZYPREXA) 15 MG tablet Take 15 mg by mouth at bedtime.      traZODone (DESYREL) 50 MG tablet Take 1 tablet (50 mg total) by mouth at bedtime for 7 days. 7 tablet 0    Musculoskeletal: Strength & Muscle Tone: within normal limits Gait & Station: normal Patient leans: N/A  Psychiatric Specialty Exam:  Presentation  General Appearance: Appropriate for Environment; Casual  Eye  Contact:Good  Speech:Clear and Coherent; Normal Rate  Speech Volume:Normal  Handedness:Right   Mood and Affect  Mood:Anxious  Affect:Congruent  Thought Process  Thought Processes:Coherent; Goal Directed  Duration of Psychotic Symptoms: Greater than six months  Past Diagnosis of Schizophrenia or Psychoactive disorder: Yes  Descriptions of Associations:Intact  Orientation:Full (Time, Place and Person)  Thought Content:Logical  Hallucinations:Hallucinations: None Description of Auditory Hallucinations: Denies any hallucinations, deluional thinking or paranoia.  Ideas of Reference:None  Suicidal Thoughts:Suicidal Thoughts: No  Homicidal Thoughts:Homicidal Thoughts: No  Sensorium  Memory:Immediate Good; Recent Good; Remote Good  Judgment:Fair  Insight:Fair  Executive Functions  Concentration:Good  Attention Span:Good  Recall:Good  Fund of Knowledge:Good  Language:Good  Psychomotor Activity  Psychomotor Activity:Psychomotor Activity: Normal  Assets  Assets:Desire for Improvement; Manufacturing systems engineerCommunication Skills; Housing; Research scientist (medical)ocial Support; Physical Health  Sleep  Sleep:Sleep: Good Number of Hours of Sleep: 6  Physical Exam: Physical Exam Vitals reviewed.  HENT:     Mouth/Throat:     Pharynx: Oropharynx is clear.  Cardiovascular:     Rate and Rhythm: Normal rate.     Pulses: Normal pulses.  Pulmonary:     Effort: Pulmonary effort is normal.  Genitourinary:    Comments: Deferred Musculoskeletal:        General: Normal range of motion.     Cervical back: Normal range of motion.  Skin:    General: Skin is warm.  Neurological:     General: No focal deficit present.     Mental Status: He  is alert and oriented to person, place, and time.    Review of Systems  Constitutional:  Negative for chills, diaphoresis and fever.  HENT:  Negative for congestion and sore throat.   Respiratory:  Negative for cough, shortness of breath and wheezing.   Cardiovascular:   Negative for chest pain and palpitations.  Gastrointestinal:  Negative for abdominal pain, constipation, diarrhea, heartburn, nausea and vomiting.  Neurological:  Negative for dizziness, tingling, tremors, sensory change, speech change, focal weakness, seizures, loss of consciousness, weakness and headaches.  Endo/Heme/Allergies:        Allergies: Benadryl, Hydroxyzine, PCN  Psychiatric/Behavioral:  Positive for substance abuse (UDS (+) for THC). Negative for memory loss and suicidal ideas. The patient is nervous/anxious and has insomnia.    Blood pressure 124/74, pulse 88, temperature 98.1 F (36.7 C), temperature source Oral, resp. rate 16, height 5\' 11"  (1.803 m), weight 82.1 kg, SpO2 96 %. Body mass index is 25.24 kg/m.  Treatment Plan Summary: Daily contact with patient to assess and evaluate symptoms and progress in treatment and Medication management.   Continue inpatient hospitalization.  Diagnoses: Bipolar disorder, manic episodes. PTSD. Cannabis use disorder.  Plan. - Restarted Depakote ER 750 mg po bid for mood stabilization.  - Restarted Olanzapine 15 mg po Q bedtime for mood control. - Restarted Melatonin 10 mg po Q bedtime for insomnia. - Initiated gabapentin 300 mg po qid for agitation/anxiety. - Resumed Trazodone 50 mg po Q hs prn for insomnia. - Initiated Lorazepam 1 mg po tid prn for severe anxiety/agitation. - Nicotine patch 14 mg trans-dermally Q 24 hours for Nicotine withdrawal.   Other prn medications.  - acetaminophen 650 mg po Q 6 hrs prn for pain/fever. - Ibuprofen 400 mg po Q bedtime prn for headaches. - Mylanta 30 ml po Q 6 hours prn for indigestion.  - MOM 30 ml po q daily prn for constipation.   Discharge Planning: Social work and case management to assist with discharge planning and identification of hospital follow-up needs prior to discharge Estimated LOS: 5-7 days Discharge Concerns: Need to establish a safety plan; Medication compliance and  effectiveness Discharge Goals: Return home with outpatient referrals for mental health follow-up including medication management/psychotherapy  Observation Level/Precautions:  15 minute checks  Laboratory:   Per  ED, current lab results reviewed. Will obtain U/A, TSH, Hgba1c, CMP, valproic level.  Psychotherapy: Enrolled in the group sessions.  Medications: See MAR.  Consultations: As needed.   Discharge Concerns: Safety, mood stability.   Estimated LOS: 3-5 days.  Other: Admit to the 400-hall.    Physician Treatment Plan for Primary Diagnosis: Bipolar 1 disorder with moderate mania (HCC)  Long Term Goal(s): Improvement in symptoms so as ready for discharge  Short Term Goals: Ability to identify changes in lifestyle to reduce recurrence of condition will improve, Ability to verbalize feelings will improve, Ability to disclose and discuss suicidal ideas, and Ability to demonstrate self-control will improve  Physician Treatment Plan for Secondary Diagnosis: Principal Problem:   Bipolar 1 disorder with moderate mania (HCC) Active Problems:   Bipolar affective disorder, currently active (HCC)  Long Term Goal(s): Improvement in symptoms so as ready for discharge  Short Term Goals: Ability to identify and develop effective coping behaviors will improve, Ability to maintain clinical measurements within normal limits will improve, Compliance with prescribed medications will improve, and Ability to identify triggers associated with substance abuse/mental health issues will improve  I certify that inpatient services furnished can reasonably be expected to  improve the patient's condition.    Armandina Stammer, NP, pmhnp, fnp-bc. 6/16/202312:56 PM

## 2021-09-28 NOTE — Progress Notes (Signed)
Pt's mother called and stated she became concerned when the patient called her and told her not to visit. Pt's mother stated the patient told her, he is leaving in 72 hours and he doesn't need his belongings. The mother stated she is concerned that he is leaving already after patient voiced having hallucinations.

## 2021-09-28 NOTE — [facility] Group Notes (Signed)
[facility] Group Notes:  (Nursing/MHT/Case Management/Adjunct)  Date:  09/28/2021  Time:  11:59 AM  Type of Therapy:  Psychoeducational Skills   Participation Level:  Active   Participation Quality:  Appropriate   Affect:  Appropriate   Cognitive:  Appropriate   Insight:  Appropriate   Engagement in Group:  Engaged   Modes of Intervention:  Discussion   Summary of Progress/Problems:   Patient attended and participated in a psycho-educational group involving self care. Patient's were asked to three questions involving self care.    What is a self care routine that you are already doing? What is a self care routine that you have recently picked up? What is a self care routine that you would like to start doing?     Positive affirmations and staying active Regulating medications Maintaining a routine   Zuzu Befort R Enes Wegener 09/28/2021, 11:59 AM

## 2021-09-28 NOTE — BHH Counselor (Signed)
Adult Comprehensive Assessment  Patient ID: Ronald [name], male   DOB: June 20, 1980, 41 y.o.   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 [employer] Employment / Job issues: Pt reports being unemployed Family Relationships: Pt reports having a good relationsip with his mother but states that is his only family Nurse, learning disability / Lack of resources (include bankruptcy): Pt reports his mother helps him financially Housing / Lack of housing: Pt reports living with his mother Physical health (include injuries & life threatening diseases): Pt reports no stressors Social relationships: Pt reports having few social relationships Substance abuse: Pt denies all substance use Bereavement / Loss: Pt reports his father passed away 3 years ago, his brother passed away in July 31, 2010, his grandfather passed away several months ago, and his cousin passed away a couple weeks ago.  Living/Environment/Situation:  Living Arrangements: Parent Living conditions (as described by patient or guardian): House/Family Who else lives in the home?: Mother How long has patient lived in current situation?: 2 years What is atmosphere in current home: Comfortable, Supportive  Family History:  Marital status: Single Are you sexually active?: No What is your sexual orientation?: Heterosexual Has your sexual activity been affected by drugs, alcohol, medication, or emotional stress?: No Does patient have children?: No  Childhood History:  By whom was/is the patient raised?: Mother, Father Additional childhood history information: stable childhood until loss of brother, father, and friend (brother and  friend were murdered) Description of patient's relationship with caregiver when they were a child: "My relationship with my parents was great" Patient's description of current relationship with people who raised him/her: "I am still very close to my mother" How were you disciplined when you got in trouble as a child/adolescent?: Spanked, grounded Did patient suffer any verbal/emotional/physical/sexual abuse as a child?: No Did patient suffer from severe childhood neglect?: No Has patient ever been sexually abused/assaulted/raped as an adolescent or adult?: No Was the patient ever a victim of a crime or a disaster?: No Witnessed domestic violence?: No Has patient been affected by domestic violence as an adult?: No  Education:  Highest grade of school patient has completed: 12th grade, Master Degree, and 2 years of Therapist, nutritional Currently a student?: No Learning disability?: No  Employment/Work Situation:   Employment Situation: Unemployed Patient's Job has Been Impacted by Current Illness: No What is the Longest Time Patient has Held a Job?: 7 years 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?: No  Financial Resources:   Surveyor, quantity resources: Support from parents / caregiver Does patient have a Lawyer or guardian?: No  Alcohol/Substance Abuse:   What has been your use of drugs/alcohol within the last 12 months?: Pt denies all substance use If attempted suicide, did drugs/alcohol play a role in this?: No Alcohol/Substance Abuse Treatment Hx: Denies past history Has alcohol/substance abuse ever caused legal problems?: No  Social Support System:   Conservation officer, nature Support System: Poor Describe Community Support System: Mother Type of faith/religion: None How does patient's faith help to cope with current illness?: N/A  Leisure/Recreation:   Do You Have Hobbies?: Yes Leisure and Hobbies: Painting, art, reading,  working out, outdoor activities  Strengths/Needs:  What is the patient's perception of their strengths?: Intelligence, athlete, oragnization, dependable, honest, caring Patient states they can use these personal strengths during their treatment to contribute to their recovery: "Positive Intent" Patient states these barriers may affect/interfere with their treatment: None Patient states these barriers may affect their return to the community: None Other important information patient would like considered in planning for their treatment: None  Discharge Plan:   Currently receiving community mental health services: No Patient states concerns and preferences for aftercare planning are: Pt is interested in therapy and medication management Patient states they will know when they are safe and ready for discharge when: "When I get back on my medications" Does patient have access to transportation?: Yes (Own car at home.) Does patient have financial barriers related to discharge medications?: Yes Patient description of barriers related to discharge medications: Limited income and no medical insurance Will patient be returning to same living situation after discharge?: Yes  Summary/Recommendations:   Summary and Recommendations (to be completed by the evaluator): Ronald [name] is a 41 year old, male, who was admitted to the hospital due to medication stabilization.  The Pt denies all suicidal thoughts, depression, and anxiety.  He reports being diagnosed with PTSD and Anxiety.  He states that he ran out of his medications approxiamtely 4 days ago and needs them refilled.  He states that he lives with his mother and that she is the only family he has alive.  He reports that his father passed away 3 years ago, his brother passed away in 08/06/2019, his grandfather passed away a few months ago, and his cousin passed away a couple weeks ago.  The Pt denies any childhood abuse or trauma but does state that he was  very close to his brother and that his passing was difficult for him.  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  He reports currently being unemployed and states that his mother helps him financially.  He denies all substance use, as well as any current or previous substance use treatment.  While in the hospital the Pt can benefit from crisis stabilization, medication evaluation, group therapy, psycho-education, case management, and discharge planning.  Upon discharge the Pt would like to return to his mothers home.  It is recommended that the Pt follow-up with a local outpatient provider for therapy and medication management services.  Aram Beecham. 09/28/2021

## 2021-09-28 NOTE — Progress Notes (Signed)
   09/28/21 0800  Psych Admission Type (Psych Patients Only)  Admission Status Involuntary  Psychosocial Assessment  Patient Complaints Anxiety  Eye Contact Fair  Facial Expression Anxious  Affect Sad  Speech Logical/coherent  Interaction Assertive  Motor Activity Other (Comment) (WNL)  Appearance/Hygiene In scrubs  Behavior Characteristics Cooperative  Mood Anxious  Thought Process  Coherency WDL  Content WDL  Delusions None reported or observed  Perception WDL  Hallucination None reported or observed  Judgment Limited  Confusion None  Danger to Self  Current suicidal ideation? Denies  Agreement Not to Harm Self Yes  Description of Agreement Vewrbally Contracts for safety  Danger to Others  Danger to Others None reported or observed

## 2021-09-28 NOTE — Progress Notes (Signed)
Pt attended AA meeting this evening.  

## 2021-09-28 NOTE — Progress Notes (Signed)
      09/27/21 2240  Psych Admission Type (Psych Patients Only)  Admission Status Involuntary  Psychosocial Assessment  Patient Complaints Self-harm thoughts;Other (Comment) (non med compliant)  Eye Contact Fair  Facial Expression Anxious  Affect Sad  Speech Logical/coherent  Interaction Assertive  Motor Activity Other (Comment) (WNL)  Appearance/Hygiene In scrubs  Behavior Characteristics Cooperative  Mood Anxious  Thought Process  Coherency WDL  Content WDL  Delusions None reported or observed  Perception WDL  Hallucination None reported or observed  Judgment Limited  Confusion None  Danger to Self  Current suicidal ideation? Denies (Denies)  Description of Suicide Plan SI with thoughts to cut wrist  Agreement Not to Harm Self Yes  Description of Agreement verbal contract for safety  Danger to Others  Danger to Others None reported or observed

## 2021-09-28 NOTE — BH IP Treatment Plan (Signed)
Interdisciplinary Treatment and Diagnostic Plan Update  09/28/2021 Time of Session: 10:15am  Ronald [name] MRN: [id number]  Principal Diagnosis: Bipolar 1 disorder with moderate mania ([place])  Secondary Diagnoses: Principal Problem:   Bipolar 1 disorder with moderate mania (HCC) Active Problems:   Bipolar affective disorder, currently active ([place])   Current Medications:  Current Facility-Administered Medications  Medication Dose Route Frequency Provider Last Rate Last Admin   acetaminophen (TYLENOL) tablet 650 mg  650 mg Oral Q6H PRN Nwoko, Herbert Pun I, NP       alum & mag hydroxide-simeth (MAALOX/MYLANTA) 200-200-20 MG/5ML suspension 30 mL  30 mL Oral Q4H PRN Nwoko, Agnes I, NP       divalproex (DEPAKOTE) DR tablet 750 mg  750 mg Oral BID Lindell Spar I, NP   750 mg at 09/28/21 1055   gabapentin (NEURONTIN) capsule 300 mg  300 mg Oral TID PC & HS Nwoko, Herbert Pun I, NP   300 mg at 09/28/21 1306   ibuprofen (ADVIL) tablet 400 mg  400 mg Oral QHS PRN Lindell Spar I, NP       LORazepam (ATIVAN) tablet 1 mg  1 mg Oral TID PRN Lindell Spar I, NP       magnesium hydroxide (MILK OF MAGNESIA) suspension 30 mL  30 mL Oral Daily PRN Nwoko, Agnes I, NP       melatonin tablet 10 mg  10 mg Oral QHS Nwoko, Agnes I, NP       multivitamin with minerals tablet 1 tablet  1 tablet Oral Daily Nwoko, Agnes I, NP   1 tablet at 09/28/21 1306   nicotine (NICODERM CQ - dosed in mg/24 hours) 14 mg/24hr patch            nicotine (NICODERM CQ - dosed in mg/24 hours) patch 14 mg  14 mg Transdermal Daily Damita Dunnings B, MD   14 mg at 09/28/21 0931   OLANZapine (ZYPREXA) tablet 15 mg  15 mg Oral QHS Nwoko, Agnes I, NP       traZODone (DESYREL) tablet 50 mg  50 mg Oral QHS Nwoko, Agnes I, NP       PTA Medications: Medications Prior to Admission  Medication Sig Dispense Refill Last Dose   Cariprazine HCl (VRAYLAR) 4.5 MG CAPS Take 1 capsule by mouth at bedtime.      divalproex (DEPAKOTE) 250 MG DR tablet Take 3 tablets  (750 mg total) by mouth 2 (two) times daily. 270 tablet 1    ibuprofen (ADVIL) 200 MG tablet Take 400 mg by mouth at bedtime as needed for headache (pain).      Melatonin 10 MG CAPS Take 10 mg by mouth at bedtime.      nicotine (NICODERM CQ - DOSED IN MG/24 HOURS) 14 mg/24hr patch Place 1 patch (14 mg total) onto the skin daily for 7 days. 7 patch 0    OLANZapine (ZYPREXA) 15 MG tablet Take 15 mg by mouth at bedtime.      traZODone (DESYREL) 50 MG tablet Take 1 tablet (50 mg total) by mouth at bedtime for 7 days. 7 tablet 0     Patient Stressors: Medication change or noncompliance   Other: "No access to medication; Unable to get refills."    Patient Strengths: Ability for insight  General fund of knowledge  Motivation for treatment/growth   Treatment Modalities: Medication Management, Group therapy, Case management,  1 to 1 session with clinician, Psychoeducation, Recreational therapy.   Physician Treatment Plan for Primary Diagnosis: Bipolar  1 disorder with moderate mania ([place]) Long Term Goal(s): Improvement in symptoms so as ready for discharge   Short Term Goals: Ability to identify and develop effective coping behaviors will improve Ability to maintain clinical measurements within normal limits will improve Compliance with prescribed medications will improve Ability to identify triggers associated with substance abuse/mental health issues will improve Ability to identify changes in lifestyle to reduce recurrence of condition will improve Ability to verbalize feelings will improve Ability to disclose and discuss suicidal ideas Ability to demonstrate self-control will improve  Medication Management: Evaluate patient's response, side effects, and tolerance of medication regimen.  Therapeutic Interventions: 1 to 1 sessions, Unit Group sessions and Medication administration.  Evaluation of Outcomes: Not Met  Physician Treatment Plan for Secondary Diagnosis: Principal Problem:    Bipolar 1 disorder with moderate mania (HCC) Active Problems:   Bipolar affective disorder, currently active ([place])  Long Term Goal(s): Improvement in symptoms so as ready for discharge   Short Term Goals: Ability to identify and develop effective coping behaviors will improve Ability to maintain clinical measurements within normal limits will improve Compliance with prescribed medications will improve Ability to identify triggers associated with substance abuse/mental health issues will improve Ability to identify changes in lifestyle to reduce recurrence of condition will improve Ability to verbalize feelings will improve Ability to disclose and discuss suicidal ideas Ability to demonstrate self-control will improve     Medication Management: Evaluate patient's response, side effects, and tolerance of medication regimen.  Therapeutic Interventions: 1 to 1 sessions, Unit Group sessions and Medication administration.  Evaluation of Outcomes: Not Met   RN Treatment Plan for Primary Diagnosis: Bipolar 1 disorder with moderate mania ([place]) Long Term Goal(s): Knowledge of disease and therapeutic regimen to maintain health will improve  Short Term Goals: Ability to remain free from injury will improve, Ability to verbalize frustration and anger appropriately will improve, Ability to demonstrate self-control, Ability to participate in decision making will improve, Ability to verbalize feelings will improve, Ability to disclose and discuss suicidal ideas, Ability to identify and develop effective coping behaviors will improve, and Compliance with prescribed medications will improve  Medication Management: RN will administer medications as ordered by provider, will assess and evaluate patient's response and provide education to patient for prescribed medication. RN will report any adverse and/or side effects to prescribing provider.  Therapeutic Interventions: 1 on 1 counseling sessions,  Psychoeducation, Medication administration, Evaluate responses to treatment, Monitor vital signs and CBGs as ordered, Perform/monitor CIWA, COWS, AIMS and Fall Risk screenings as ordered, Perform wound care treatments as ordered.  Evaluation of Outcomes: Not Met   LCSW Treatment Plan for Primary Diagnosis: Bipolar 1 disorder with moderate mania ([place]) Long Term Goal(s): Safe transition to appropriate next level of care at discharge, Engage patient in therapeutic group addressing interpersonal concerns.  Short Term Goals: Engage patient in aftercare planning with referrals and resources, Increase social support, Increase ability to appropriately verbalize feelings, Increase emotional regulation, Facilitate acceptance of mental health diagnosis and concerns, Facilitate patient progression through stages of change regarding substance use diagnoses and concerns, Identify triggers associated with mental health/substance abuse issues, and Increase skills for wellness and recovery  Therapeutic Interventions: Assess for all discharge needs, 1 to 1 time with Social worker, Explore available resources and support systems, Assess for adequacy in community support network, Educate family and significant other(s) on suicide prevention, Complete Psychosocial Assessment, Interpersonal group therapy.  Evaluation of Outcomes: Not Met   Progress in Treatment: Attending groups:  No. Participating in groups: No. Taking medication as prescribed: Yes. Toleration medication: Yes. Family/Significant other contact made: No, will contact:  CSW will assess and identify support person Patient understands diagnosis: Yes. Discussing patient identified problems/goals with staff: Yes. Medical problems stabilized or resolved: Yes. Denies suicidal/homicidal ideation: Yes. Issues/concerns per patient self-inventory: No. Other: patient worried about at home medications being given to him while in hospital.  Currently , patient  reports all his medications are not being given to him.   New problem(s) identified: Yes, Describe:  home medications are not the same as what is being given at hospital.  Dr. To meet with him to discuss  New Short Term/Long Term Goal(s):  medication stabilization, elimination of SI thoughts, development of comprehensive mental wellness plan.    Patient Goals: Patient states, "I would like to regulate my medications"   Discharge Plan or Barriers: Patient recently admitted. CSW will continue to follow and assess for appropriate referrals and possible discharge planning.    Reason for Continuation of Hospitalization: Anxiety Medication stabilization  Estimated Length of Stay: 3-7 days  Last 3 [place] Suicide Severity Risk Score: Flowsheet Row Admission (Current) from 09/27/2021 in [facility] ED from 09/26/2021 in [facility] ED from 09/24/2021 in [facility]  C-SSRS RISK CATEGORY No Risk High Risk No Risk       Last PHQ 2/9 Scores:    09/19/2021    1:54 PM 03/21/2021   10:56 AM 10/31/2020    4:19 PM  Depression screen PHQ 2/9  Decreased Interest 0 0 0  Down, Depressed, Hopeless 0 0 0  PHQ - 2 Score 0 0 0  Altered sleeping 0    Tired, decreased energy 0    Change in appetite 0    Feeling bad or failure about yourself  0    Trouble concentrating 0    Moving slowly or fidgety/restless 0    Suicidal thoughts 0    PHQ-9 Score 0    Difficult doing work/chores Not difficult at all      Scribe for Treatment Team: Zachery Conch, LCSW 09/28/2021 1:09 PM

## 2021-09-28 NOTE — Plan of Care (Signed)
  Problem: Education: Goal: Emotional status will improve Outcome: Not Progressing Goal: Mental status will improve Outcome: Not Progressing Goal: Verbalization of understanding the information provided will improve Outcome: Not Progressing   

## 2021-09-28 NOTE — BHH Group Notes (Signed)
BHH Group Notes:  (Nursing/MHT/Case Management/Adjunct)  Date:  09/28/2021  Time:  9:41 AM  Type of Therapy:  Group Therapy  Participation Level:  Active  Participation Quality:  Appropriate  Affect:  Appropriate  Cognitive:  Appropriate  Insight:  Appropriate  Engagement in Group:  Engaged  Modes of Intervention:  Discussion  Summary of Progress/Problems:  Patient attended and participated in an orientation and goals group today. Patient's goal for today is to stay on his medication and get regulated.   Ronald [name] 09/28/2021, 9:41 AM

## 2021-09-28 NOTE — BHH Suicide Risk Assessment (Cosign Needed)
Suicide Risk Assessment  Admission Assessment    [facility] Admission Suicide Risk Assessment   Nursing information obtained from:  Patient  Demographic factors:  Male, Caucasian  Current Mental Status:  Suicidal ideation indicated by patient, Self-harm thoughts  Loss Factors:  NA  Historical Factors:  Impulsivity  Risk Reduction Factors:  Positive social support, Living with another person, especially a relative  Total Time spent with patient: 1 hour  Principal Problem: Bipolar affective disorder, currently active (HCC)  Diagnosis:  Principal Problem:   Bipolar affective disorder, currently active (HCC)  Subjective Data: See H&P  Continued Clinical Symptoms:  Alcohol Use Disorder Identification Test Final Score (AUDIT): 0 The "Alcohol Use Disorders Identification Test", Guidelines for Use in Primary Care, Second Edition.  World Science writer [facility]). Score between 0-7:  no or low risk or alcohol related problems. Score between 8-15:  moderate risk of alcohol related problems. Score between 16-19:  high risk of alcohol related problems. Score 20 or above:  warrants further diagnostic evaluation for alcohol dependence and treatment.  CLINICAL FACTORS:   Severe Anxiety and/or Agitation Bipolar Disorder:   Mixed State Alcohol/Substance Abuse/Dependencies More than one psychiatric diagnosis Previous Psychiatric Diagnoses and Treatments  Musculoskeletal: Strength & Muscle Tone: within normal limits Gait & Station: normal Patient leans: N/A  Psychiatric Specialty Exam:  Presentation  General Appearance: Appropriate for Environment; Casual  Eye Contact:Good  Speech:Clear and Coherent; Normal Rate  Speech Volume:Normal  Handedness:Right   Mood and Affect  Mood:Anxious  Affect:Congruent   Thought Process  Thought Processes:Coherent; Goal Directed  Descriptions of Associations:Intact  Orientation:Full (Time, Place and Person)  Thought  Content:Logical  History of Schizophrenia/Schizoaffective disorder:Yes  Duration of Psychotic Symptoms:Greater than six months  Hallucinations:Hallucinations: None Description of Auditory Hallucinations: Denies any hallucinations, deluional thinking or paranoia.  Ideas of Reference:None  Suicidal Thoughts:Suicidal Thoughts: No  Homicidal Thoughts:Homicidal Thoughts: No   Sensorium  Memory:Immediate Good; Recent Good; Remote Good  Judgment:Fair  Insight:Fair   Executive Functions  Concentration:Good  Attention Span:Good  Recall:Good  Fund of Knowledge:Good  Language:Good   Psychomotor Activity  Psychomotor Activity:Psychomotor Activity: Normal  Assets  Assets:Desire for Improvement; Manufacturing systems engineer; Housing; Research scientist (medical); Physical Health  Sleep  Sleep:Sleep: Good Number of Hours of Sleep: 6  Physical Exam: See H&P.  Blood pressure 124/74, pulse 88, temperature 98.1 F (36.7 C), temperature source Oral, resp. rate 16, height 5\' 11"  (1.803 m), weight 82.1 kg, SpO2 96 %. Body mass index is 25.24 kg/m.  COGNITIVE FEATURES THAT CONTRIBUTE TO RISK:  Closed-mindedness and Thought constriction (tunnel vision)    SUICIDE RISK:   Moderate:  Frequent suicidal ideation with limited intensity, and duration, some specificity in terms of plans, no associated intent, good self-control, limited dysphoria/symptomatology, some risk factors present, and identifiable protective factors, including available and accessible social support.  PLAN OF CARE: See H&P.  I certify that inpatient services furnished can reasonably be expected to improve the patient's condition.   , NP, pmhnp, fnp-bc 09/28/2021, 12:08 PM

## 2021-09-28 NOTE — Tx Team (Signed)
Initial Treatment Plan 09/28/2021 4:11 AM [name] ZOX:[id number]    PATIENT STRESSORS: Medication change or noncompliance   Other: "No access to medication; Unable to get refills."     PATIENT STRENGTHS: Ability for insight  General fund of knowledge  Motivation for treatment/growth    PATIENT IDENTIFIED PROBLEMS: Suicidal  Depression  Anxiety        "Help with managing my medications.         DISCHARGE CRITERIA:  Ability to meet basic life and health needs Improved stabilization in mood, thinking, and/or behavior Reduction of life-threatening or endangering symptoms to within safe limits  PRELIMINARY DISCHARGE PLAN: Outpatient therapy Return to previous living arrangement  PATIENT/FAMILY INVOLVEMENT: This treatment plan has been presented to and reviewed with the patient, STEFFEN HASE.  The patient and family have been given the opportunity to ask questions and make suggestions.  Marcie Bal, RN 09/28/2021, 4:11 AM

## 2021-09-28 NOTE — Progress Notes (Signed)
    Initial Admission Note:  Ronald [name] is a 41 y.o. male being admitted involuntarily to [facility] on today.  Pt presents with a diagnosis of Bipolar DO.  Pt denies anxiety, depression, and pain.  Pt expresses that he is here for his medications.  Pt denies SI/HI/AVH, and verbally contracts for safety.  According to previous notes, pt, with a history of bipolar disorder presented to the [facility] ED where he endorsed SI, with concern for need of a psychiatric evaluation. Pt reports he has been out of his home medications for the past 5 days.  He has not been sleeping and been staying up all night with racing thoughts.  Last night, he began to have thoughts of suicide and had plans to cut his wrist but did not fully go through with it.  He has previously required inpatient hospitalizations for medical stabilization.  Pt is negative for COVID and +THC.  Pt reports he chews enough tobacco to wear a patch and chew gum.  Pt denies drinking alcohol.  Pt's current stressor is not having access to his medications and being unable to get refills.  Pt is alert and oriented.  Pt presents with a sad effect and anxious mood.  Pt speech is logical and coherent.  Pt desires help with managing his medications.  Skin search is conducted and resulted in multiple tattoos.  No contraband was found.  Pt signed all admission documents.  Pt was provided information on the rules and protocol of the unit.  Pt was oriented to the staff and unit.  Pt was provided nourishment and oral fluids.  Pt is presently safe on the unit with Q 15 minute safety checks.

## 2021-09-29 ENCOUNTER — Other Ambulatory Visit: Payer: Self-pay

## 2021-09-29 LAB — URINALYSIS, ROUTINE W REFLEX MICROSCOPIC
Bilirubin Urine: NEGATIVE
Glucose, UA: NEGATIVE mg/dL
Hgb urine dipstick: NEGATIVE
Ketones, ur: NEGATIVE mg/dL
Leukocytes,Ua: NEGATIVE
Nitrite: NEGATIVE
Protein, ur: NEGATIVE mg/dL
Specific Gravity, Urine: 1.009 (ref 1.005–1.030)
pH: 7 (ref 5.0–8.0)

## 2021-09-29 LAB — TSH: TSH: 1.884 u[IU]/mL (ref 0.350–4.500)

## 2021-09-29 LAB — HEMOGLOBIN A1C
Hgb A1c MFr Bld: 4.9 % (ref 4.8–5.6)
Mean Plasma Glucose: 93.93 mg/dL

## 2021-09-29 NOTE — [facility] Group Notes (Signed)
.  Psychoeducational Group Note  Date: 09/29/2021 Time: 0900-1000    Goal Setting   Purpose of Group: This group helps to provide patients with the steps of setting a goal that is specific, measurable, attainable, realistic and time specific. A discussion on how we keep ourselves stuck with negative self talk. Homework given for Patients to write 30 positive attributes about themselves.    Participation Level:  Active  Participation Quality:  Appropriate  Affect:  Appropriate  Cognitive:  Appropriate  Insight:  Improving  Engagement in Group:  Engaged  Additional Comments:  Rates his energy at a 10/10. States he is here for medication adjustment  Dione Housekeeper

## 2021-09-29 NOTE — Group Note (Signed)
Date:  09/29/2021 Time:  10:45 AM  Group Topic/Focus:  Orientation:   The focus of this group is to educate the patient on the purpose and policies of crisis stabilization and provide a format to answer questions about their admission.  The group details unit policies and expectations of patients while admitted.    Participation Level:  Did Not Attend  Participation Quality:    Affect:    Cognitive:    Insight:   Engagement in Group:    Modes of Intervention:    Additional Comments:    Jaquita Rector 09/29/2021, 10:45 AM

## 2021-09-29 NOTE — Plan of Care (Signed)
  Problem: Education: Goal: Knowledge of [facility] Education information/materials will improve Outcome: Progressing Goal: Emotional status will improve Outcome: Progressing Goal: Mental status will improve Outcome: Progressing Goal: Verbalization of understanding the information provided will improve Outcome: Progressing   Problem: Activity: Goal: Interest or engagement in activities will improve Outcome: Progressing Goal: Sleeping patterns will improve Outcome: Progressing   Problem: Coping: Goal: Ability to verbalize frustrations and anger appropriately will improve Outcome: Progressing Goal: Ability to demonstrate self-control will improve Outcome: Progressing   Problem: Health Behavior/Discharge Planning: Goal: Identification of resources available to assist in meeting health care needs will improve Outcome: Progressing Goal: Compliance with treatment plan for underlying cause of condition will improve Outcome: Progressing   Problem: Physical Regulation: Goal: Ability to maintain clinical measurements within normal limits will improve Outcome: Progressing   Problem: Safety: Goal: Periods of time without injury will increase Outcome: Progressing   Problem: Education: Goal: Ability to make informed decisions regarding treatment will improve Outcome: Progressing   Problem: Coping: Goal: Coping ability will improve Outcome: Progressing   Problem: Health Behavior/Discharge Planning: Goal: Identification of resources available to assist in meeting health care needs will improve Outcome: Progressing   Problem: Medication: Goal: Compliance with prescribed medication regimen will improve Outcome: Progressing   Problem: Self-Concept: Goal: Ability to disclose and discuss suicidal ideas will improve Outcome: Progressing Goal: Will verbalize positive feelings about self Outcome: Progressing   Problem: Education: Goal: Utilization of techniques to improve  thought processes will improve Outcome: Progressing Goal: Knowledge of the prescribed therapeutic regimen will improve Outcome: Progressing   Problem: Activity: Goal: Interest or engagement in leisure activities will improve Outcome: Progressing Goal: Imbalance in normal sleep/wake cycle will improve Outcome: Progressing   Problem: Coping: Goal: Coping ability will improve Outcome: Progressing Goal: Will verbalize feelings Outcome: Progressing   Problem: Health Behavior/Discharge Planning: Goal: Ability to make decisions will improve Outcome: Progressing Goal: Compliance with therapeutic regimen will improve Outcome: Progressing   Problem: Role Relationship: Goal: Will demonstrate positive changes in social behaviors and relationships Outcome: Progressing   Problem: Safety: Goal: Ability to disclose and discuss suicidal ideas will improve Outcome: Progressing Goal: Ability to identify and utilize support systems that promote safety will improve Outcome: Progressing   Problem: Self-Concept: Goal: Will verbalize positive feelings about self Outcome: Progressing Goal: Level of anxiety will decrease Outcome: Progressing   Problem: Education: Goal: Ability to state activities that reduce stress will improve Outcome: Progressing   Problem: Coping: Goal: Ability to identify and develop effective coping behavior will improve Outcome: Progressing   Problem: Self-Concept: Goal: Ability to identify factors that promote anxiety will improve Outcome: Progressing Goal: Level of anxiety will decrease Outcome: Progressing Goal: Ability to modify response to factors that promote anxiety will improve Outcome: Progressing   

## 2021-09-29 NOTE — Progress Notes (Addendum)
Patient appears irritable.  Pt was very demanding that the nurses get him new clothes. When asked if we could wash his scrubs he said "No. I need new clothes." Pt is easily redirectable when irritated. Patient denies SI/HI/AVH. Patient complied with morning medication with no reported side effects. Patient remains safe on Q38min checks and contracts for safety.       09/29/21 1000  Psych Admission Type (Psych Patients Only)  Admission Status Involuntary  Psychosocial Assessment  Patient Complaints Anxiety  Eye Contact Fair  Facial Expression Anxious  Affect Sad  Speech Logical/coherent  Interaction Assertive;Demanding  Appearance/Hygiene In scrubs  Behavior Characteristics Anxious;Irritable  Mood Anxious  Thought Process  Coherency WDL  Content WDL  Delusions None reported or observed  Perception WDL  Hallucination None reported or observed  Judgment Limited  Confusion None  Danger to Self  Current suicidal ideation? Denies  Agreement Not to Harm Self Yes  Description of Agreement verbal  Danger to Others  Danger to Others None reported or observed

## 2021-09-29 NOTE — [facility] Group Notes (Signed)
    Purpose of Group: . The group focus' on teaching patients on how to identify their needs and their Life Skills:  A group where two lists are made. What people need and what are things that we do that are unhealthy. The lists are developed by the patients and it is explained that we often do the actions that are not healthy to get our list of needs met.  Goal:: to develop the coping skills needed to get their needs met  Participation Level:  Active  Participation Quality:  Appropriate  Affect:  Appropriate  Cognitive:  Oriented  Insight:  Improving  Engagement in Group:  Engaged  Additional Comments: Pt attended the group. Rates his energy at a 10/10. States he is here do to Medications adjustment. Participated fully in the group.

## 2021-09-29 NOTE — Group Note (Signed)
LCSW Group Therapy Note  09/29/2021   10:00-11:00am   Type of Therapy and Topic:  Group Therapy: Anger Cues and Responses  Participation Level:  Active   Description of Group:   In this group, patients learned how to recognize the physical, cognitive, emotional, and behavioral responses they have to anger-provoking situations.  They identified a recent time they became angry and how they reacted.  They analyzed how their reaction was possibly beneficial and how it was possibly unhelpful.  The group discussed a variety of healthier coping skills that could help with such a situation in the future.  They also learned that anger is a second emotion fueled by other feelings and explored their own emotions that may frequently fuel their anger.  Focus was placed on how helpful it is to recognize the underlying emotions to our anger, because working on those can lead to a more permanent solution as well as our ability to focus on the important rather than the urgent.  Therapeutic Goals: Patients will remember their last incident of anger and how they felt emotionally and physically, what their thoughts were at the time, and how they behaved. Patients will identify how their behavior at that time worked for them, as well as how it worked against them. Patients will explore possible new behaviors to use in future anger situations. Patients will learn that anger itself is normal and cannot be eliminated, and that healthier reactions can assist with resolving conflict rather than worsening situations. Patients will learn that anger is a secondary emotion and worked to identify some of the underlying feelings that may lead to anger.  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  He talked frequently throughout group.  Therapeutic  Modalities:   Cognitive Behavioral Therapy  Lynnell Chad

## 2021-09-29 NOTE — Progress Notes (Signed)
[facility] MD Progress Note  09/29/2021 1:45 PM Ronald [name]  MRN:  [id number]  Subjective:  Ronald [name] reports, "I'm doing well. I just will need my medications at the right times. My symptoms are showing signs of improvement".  Reason for admission: 41 y.o. Caucasian male with prior history of Bipolar disorder, cannabis use disorder & multiple psychiatric hospitalizations/treatments including outpatient psychiatric services. Patient was admitted at [facility] ED with complain/concerns for psychiatric evaluation & re-initiation of his mental health medication.   Daily notes: Ronald [name] is seen in his room. He is lying down in bed. He presents alert, oriented & aware of situation. He is visible on the unit, attending group sessions. Ronald [name] is making a good eye contact & verbally responsive. He reports during this evaluation that he is doing well. He denies any symptoms of depression or anxiety. He is taking & tolerating his treatment regimen, denies any side effects. He reports good appetite & sleeping well at night. The nurse reports this morning that Ronald [name] got upset about the dosing time fr his Depakote last evening. Says he wants to take his Depakote at 8 am & 8 pm & not  8 am & 5 pm. This dosing time has been adjusted to meet patient's need. The nurse also reports that although, patient was upset at the time, was easily redirected. Ronald [name] currently denies any SIHI, AVH, delusional thoughts or paranoia. He does not appear to be responding to any internal stimuli. Reviewed current lab results, no changes.   Principal Problem: Bipolar 1 disorder with moderate mania (HCC)  Diagnosis: Principal Problem:   Bipolar 1 disorder with moderate mania (HCC) Active Problems:   Bipolar affective disorder, currently active (HCC)  Total Time spent with patient:  35 minutes  Past Psychiatric History: Bipolar disorder, PTSD.  Past Medical History:  Past Medical History:  Diagnosis Date   Anxiety     Arthritis    Disorder of pineal gland    Post traumatic stress disorder     Past Surgical History:  Procedure Laterality Date   ANKLE SURGERY     Family History: History reviewed. No pertinent family history.  Family Psychiatric  History: See H&P  Social History:  Social History   Substance and Sexual Activity  Alcohol Use No     Social History   Substance and Sexual Activity  Drug Use Yes   Types: Amphetamines, Benzodiazepines, Cocaine, Marijuana, Methamphetamines   Comment: one year ago    Social History   Socioeconomic History   Marital status: Single    Spouse name: Not on file   Number of children: Not on file   Years of education: Not on file   Highest education level: Not on file  Occupational History   Not on file  Tobacco Use   Smoking status: Never   Smokeless tobacco: Current    Types: Chew  Vaping Use   Vaping Use: Never used  Substance and Sexual Activity   Alcohol use: No   Drug use: Yes    Types: Amphetamines, Benzodiazepines, Cocaine, Marijuana, Methamphetamines    Comment: one year ago   Sexual activity: Never  Other Topics Concern   Not on file  Social History Narrative   Not on file   Social Determinants of Health   Financial Resource Strain: Not on file  Food Insecurity: Not on file  Transportation Needs: Not on file  Physical Activity: Not on file  Stress: Not on file  Social Connections: Not on file  Additional Social History:   Sleep: Good  Appetite:  Good  Current Medications: Current Facility-Administered Medications  Medication Dose Route Frequency Provider Last Rate Last Admin   acetaminophen (TYLENOL) tablet 650 mg  650 mg Oral Q6H PRN Tanette Chauca I, NP       alum & mag hydroxide-simeth (MAALOX/MYLANTA) 200-200-20 MG/5ML suspension 30 mL  30 mL Oral Q4H PRN Mishawn Didion I, NP       divalproex (DEPAKOTE) DR tablet 750 mg  750 mg Oral BID Armandina Stammer I, NP   750 mg at 09/29/21 0809   gabapentin (NEURONTIN) capsule  300 mg  300 mg Oral TID PC & HS Darious Rehman I, NP   300 mg at 09/29/21 1249   ibuprofen (ADVIL) tablet 400 mg  400 mg Oral QHS PRN Armandina Stammer I, NP       LORazepam (ATIVAN) tablet 1 mg  1 mg Oral TID PRN Armandina Stammer I, NP       magnesium hydroxide (MILK OF MAGNESIA) suspension 30 mL  30 mL Oral Daily PRN Sonnia Strong I, NP       melatonin tablet 10 mg  10 mg Oral QHS Reyana Leisey, Nicole Kindred I, NP   10 mg at 09/28/21 2133   multivitamin with minerals tablet 1 tablet  1 tablet Oral Daily Armandina Stammer I, NP   1 tablet at 09/29/21 4401   nicotine (NICODERM CQ - dosed in mg/24 hours) patch 14 mg  14 mg Transdermal Daily Eliseo Gum B, MD   14 mg at 09/29/21 0810   OLANZapine (ZYPREXA) tablet 15 mg  15 mg Oral QHS Mirai Greenwood, Nicole Kindred I, NP   15 mg at 09/28/21 2133   traZODone (DESYREL) tablet 50 mg  50 mg Oral QHS Armandina Stammer I, NP   50 mg at 09/28/21 2133    Lab Results:  Results for orders placed or performed during the hospital encounter of 09/27/21 (from the past 48 hour(s))  TSH     Status: None   Collection Time: 09/29/21  6:33 AM  Result Value Ref Range   TSH 1.884 0.350 - 4.500 uIU/mL    Comment: Performed by a 3rd Generation assay with a functional sensitivity of <=0.01 uIU/mL. Performed at [facility], [address] [place], [name] [address]   Hemoglobin A1c     Status: None   Collection Time: 09/29/21  6:33 AM  Result Value Ref Range   Hgb A1c MFr Bld 4.9 4.8 - 5.6 %    Comment: (NOTE) Pre diabetes:          5.7%-6.4%  Diabetes:              >6.4%  Glycemic control for   <7.0% adults with diabetes    Mean Plasma Glucose 93.93 mg/dL    Comment: Performed at [facility], [address] [place], [name] [address]   Blood Alcohol level:  Lab Results  Component Value Date   ETH <10 09/26/2021   ETH <10 09/24/2021   Metabolic Disorder Labs: Lab Results  Component Value Date   HGBA1C 4.9 09/29/2021   MPG 93.93 09/29/2021   MPG 96.8 02/19/2020   No  results found for: "PROLACTIN" Lab Results  Component Value Date   CHOL 131 02/19/2020   TRIG 76 02/19/2020   HDL 61 02/19/2020   CHOLHDL 2.1 02/19/2020   VLDL 15 02/19/2020   LDLCALC 55 02/19/2020   Physical Findings: AIMS:  , ,  ,  ,  CIWA:    COWS:     Musculoskeletal: Strength & Muscle Tone: within normal limits Gait & Station: normal Patient leans: N/A  Psychiatric Specialty Exam:  Presentation  General Appearance: Appropriate for Environment; Casual  Eye Contact:Good  Speech:Clear and Coherent; Normal Rate  Speech Volume:Normal  Handedness:Right  Mood and Affect  Mood:Anxious  Affect:Congruent  Thought Process  Thought Processes:Coherent; Goal Directed  Descriptions of Associations:Intact  Orientation:Full (Time, Place and Person)  Thought Content:Logical  History of Schizophrenia/Schizoaffective disorder:Yes  Duration of Psychotic Symptoms:Greater than six months  Hallucinations:Hallucinations: None Description of Auditory Hallucinations: Denies any hallucinations, deluional thinking or paranoia.  Ideas of Reference:None  Suicidal Thoughts:Suicidal Thoughts: No  Homicidal Thoughts:Homicidal Thoughts: No  Sensorium  Memory:Immediate Good; Recent Good; Remote Good  Judgment:Fair  Insight:Fair  Executive Functions  Concentration:Good  Attention Span:Good  Recall:Good  Fund of Knowledge:Good  Language:Good  Psychomotor Activity  Psychomotor Activity:Psychomotor Activity: Normal  Assets  Assets:Desire for Improvement; Manufacturing systems engineer; Housing; Research scientist (medical); Physical Health  Sleep  Sleep:Sleep: Good Number of Hours of Sleep: 8.5  Physical Exam: Physical Exam Vitals and nursing note reviewed.  HENT:     Nose: Nose normal.     Mouth/Throat:     Pharynx: Oropharynx is clear.  Cardiovascular:     Pulses: Normal pulses.     Comments: Elevated blood pressure: 125/94. Pulse rate: 66. Patient is currently in no  apparent distress. Will recheck. Pulmonary:     Effort: Pulmonary effort is normal.  Genitourinary:    Comments: Deferred Musculoskeletal:        General: Normal range of motion.     Cervical back: Normal range of motion.  Skin:    General: Skin is warm and dry.  Neurological:     General: No focal deficit present.     Mental Status: He is alert and oriented to person, place, and time.    Review of Systems  Constitutional:  Negative for chills, diaphoresis and fever.  HENT:  Negative for congestion and sore throat.   Respiratory:  Negative for cough, shortness of breath and wheezing.   Cardiovascular:  Negative for chest pain and palpitations.  Gastrointestinal:  Negative for abdominal pain, constipation, diarrhea, heartburn, nausea and vomiting.  Neurological:  Negative for dizziness, tingling, tremors, sensory change, speech change, focal weakness, seizures, loss of consciousness, weakness and headaches.  Endo/Heme/Allergies:        Allergies: Benadryl, hydroxyzine, PCN  Psychiatric/Behavioral:  Positive for substance abuse (Hx. THC use). Negative for depression, hallucinations, memory loss and suicidal ideas. The patient is not nervous/anxious and does not have insomnia.    Blood pressure 131/84, pulse 77, temperature 97.9 F (36.6 C), temperature source Oral, resp. rate 16, height 5\' 11"  (1.803 m), weight 82.1 kg, SpO2 99 %. Body mass index is 25.24 kg/m.  Treatment Plan Summary: Daily contact with patient to assess and evaluate symptoms and progress in treatment and Medication management.   Continue inpatient hospitalization.  Will continue today 09/29/2021 plan as below except where it is noted.    Diagnoses: Bipolar disorder, manic episodes. PTSD. Cannabis use disorder.   Plan. - Continue Depakote ER 750 mg po bid for mood stabilization.  - Continue Olanzapine 15 mg po Q bedtime for mood control. - Continue Melatonin 10 mg po Q bedtime for insomnia. - Continue  gabapentin 300 mg po qid for agitation/anxiety. - Resumed Trazodone 50 mg po Q hs prn for insomnia. - Continue Lorazepam 1 mg po tid prn for severe anxiety/agitation. -  Nicotine patch 14 mg trans-dermally Q 24 hours for Nicotine withdrawal.    Other prn medications.  - acetaminophen 650 mg po Q 6 hrs prn for pain/fever. - Ibuprofen 400 mg po Q bedtime prn for headaches. - Mylanta 30 ml po Q 6 hours prn for indigestion.  - MOM 30 ml po q daily prn for constipation.    Discharge Planning: Social work and case management to assist with discharge planning and identification of hospital follow-up needs prior to discharge Estimated LOS: 5-7 days Discharge Concerns: Need to establish a safety plan; Medication compliance and effectiveness Discharge Goals: Return home with outpatient referrals for mental health follow-up including medication management/psychotherapy  Armandina Stammer, NP, pmhnp, fnp-bc 09/29/2021, 1:45 PM

## 2021-09-29 NOTE — Progress Notes (Signed)
[facility] Group Notes:  (Nursing/MHT/Case Management/Adjunct)  Date:  09/29/2021  Time:  2015  Type of Therapy:   wrap up group  Participation Level:  Active  Participation Quality:  Appropriate, Attentive, Sharing, and Supportive  Affect:  Excited  Cognitive:  Alert  Insight:  Improving  Engagement in Group:  Engaged  Modes of Intervention:  Clarification, Education, and Support  Summary of Progress/Problems: Positive thinking and positive change were discussed.   Marcille Buffy 09/29/2021, 9:34 PM

## 2021-09-29 NOTE — Group Note (Deleted)
LCSW Group Therapy Note   Group Date: 09/29/2021 Start Time: 1000 End Time: 1100   Type of Therapy and Topic:  Group Therapy:   Participation Level:  {[facility] PARTICIPATION LEVEL:22264}  Description of Group:   Therapeutic Goals:  1.     Summary of Patient Progress:    ***  Therapeutic Modalities:   Avelardo Reesman J Grossman-Orr, LCSWA 09/29/2021  9:41 AM    

## 2021-09-29 NOTE — Progress Notes (Signed)
  Patient compliant with medications per Provider order. Support and encouragement provided. Routine safety checks conducted every 15 minutes. Denies SI/HI/A/VH. Patient notified to inform staff with problems or concerns.  No adverse drug reactions noted. Patient contracts for safety at this time.

## 2021-09-30 DIAGNOSIS — F3112 Bipolar disorder, current episode manic without psychotic features, moderate: Secondary | ICD-10-CM

## 2021-09-30 MED ORDER — NICOTINE 21 MG/24HR TD PT24
21.0000 mg | MEDICATED_PATCH | Freq: Every day | TRANSDERMAL | Status: DC
  Administered 2021-09-30 – 2021-10-01 (×2): 21 mg via TRANSDERMAL
  Filled 2021-09-30 (×2): qty 1

## 2021-09-30 NOTE — Progress Notes (Signed)
[facility] Group Notes:  (Nursing/MHT/Case Management/Adjunct)  Date:  09/30/2021  Time:  2015  Type of Therapy:   wrap up group  Participation Level:  Active  Participation Quality:  Appropriate, Attentive, Sharing, and Supportive  Affect:  Appropriate  Cognitive:  Alert  Insight:  Improving  Engagement in Group:  Engaged  Modes of Intervention:  Clarification, Education, and Socialization  Summary of Progress/Problems: Positive thinking and self-care were discussed.   Marcille Buffy 09/30/2021, 9:31 PM

## 2021-09-30 NOTE — [facility] Suicide Risk Assessment (Signed)
[facility] INPATIENT:  Family/Significant Other Suicide Prevention Education  Suicide Prevention Education:  Education Completed; mother [name] [phone] ,  (name of family member/significant other) has been identified by the patient as the family member/significant other with whom the patient will be residing, and identified as the person(s) who will aid the patient in the event of a mental health crisis (suicidal ideations/suicide attempt).    Please see separate Epic note with collateral from mother.  There are no guns in the home.  He has knives hidden where she cannot find them.  With written consent from the patient, the family member/significant other has been provided the following suicide prevention education, prior to the and/or following the discharge of the patient.  The suicide prevention education provided includes the following: Suicide risk factors Suicide prevention and interventions National Suicide Hotline telephone number [facility] assessment telephone number [facility] Emergency Assistance 911 [facility] and/or Residential Mobile Crisis Unit telephone number  Request made of family/significant other to: Remove weapons (e.g., guns, rifles, knives), all items previously/currently identified as safety concern.   Remove drugs/medications (over-the-counter, prescriptions, illicit drugs), all items previously/currently identified as a safety concern.  The family member/significant other verbalizes understanding of the suicide prevention education information provided.  The family member/significant other agrees to remove the items of safety concern listed above.  Carloyn Jaeger Grossman-Orr 09/30/2021, 3:39 PM
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
## 2021-09-30 NOTE — [facility] Group Notes (Signed)
[facility] Group Notes:  (Nursing/MHT/Case Management/Adjunct)  Date:  09/30/2021  Time:  9:35 AM  Type of Therapy:  Group Therapy  Participation Level:  Active  Participation Quality:  Appropriate  Affect:  Appropriate  Cognitive:  Appropriate  Insight:  Appropriate  Engagement in Group:  Engaged  Modes of Intervention:  Discussion  Summary of Progress/Problems:  Patient attended and participated in an orientation and goals group today. Patient's goal for today is to get his medication regulated.   [name] 09/30/2021, 9:35 AM

## 2021-09-30 NOTE — Progress Notes (Signed)
[facility] MD Progress Note  09/30/2021 11:23 AM RONOLD HARDGROVE  MRN:  [id number]  Subjective:  Kendarius reports, "I feel so much better. My mood is good. I slept well last night".  Reason for admission: 41 y.o. Caucasian male with prior history of Bipolar disorder, cannabis use disorder & multiple psychiatric hospitalizations/treatments including outpatient psychiatric services. Patient was admitted at [facility] ED with complain/concerns for psychiatric evaluation & re-initiation of his mental health medication.   Daily notes: Gary is seen at cubicle facing the nurses station. He presents alert, oriented & aware of situation. He is visible on the unit, attending group sessions. Tymel is making a good eye contact & verbally responsive. He reports during this evaluation that he is doing much better. Feels his medications are helping. He denies any symptoms of depression or anxiety. He is taking & tolerating his treatment regimen, denies any side effects. He reports good appetite & sleeping well at night. The nurse reports this morning that Olyn says he feels he should be discussing plans for discharge. Lateef is informed by the provider that his Depakote level is scheduled to be drawn & checked tomorrow & could be discharged if the levels are good & his continues to do well. Patient shaved & maintained his personal hygiene. He currently denies any SIHI, AVH, delusional thoughts or paranoia. He does not appear to be responding to any internal stimuli. Reviewed current lab results, no changes. Vital signs remain stable. There are no behavioral issues reported.  Principal Problem: Bipolar 1 disorder with moderate mania (HCC)  Diagnosis: Principal Problem:   Bipolar 1 disorder with moderate mania (HCC) Active Problems:   Bipolar affective disorder, currently active (HCC)  Total Time spent with patient:  35 minutes  Past Psychiatric History: Bipolar disorder, PTSD.  Past Medical History:   Past Medical History:  Diagnosis Date   Anxiety    Arthritis    Disorder of pineal gland    Post traumatic stress disorder     Past Surgical History:  Procedure Laterality Date   ANKLE SURGERY     Family History: History reviewed. No pertinent family history.  Family Psychiatric  History: See H&P  Social History:  Social History   Substance and Sexual Activity  Alcohol Use No     Social History   Substance and Sexual Activity  Drug Use Yes   Types: Amphetamines, Benzodiazepines, Cocaine, Marijuana, Methamphetamines   Comment: one year ago    Social History   Socioeconomic History   Marital status: Single    Spouse name: Not on file   Number of children: Not on file   Years of education: Not on file   Highest education level: Not on file  Occupational History   Not on file  Tobacco Use   Smoking status: Never   Smokeless tobacco: Current    Types: Chew  Vaping Use   Vaping Use: Never used  Substance and Sexual Activity   Alcohol use: No   Drug use: Yes    Types: Amphetamines, Benzodiazepines, Cocaine, Marijuana, Methamphetamines    Comment: one year ago   Sexual activity: Never  Other Topics Concern   Not on file  Social History Narrative   Not on file   Social Determinants of Health   Financial Resource Strain: Not on file  Food Insecurity: Not on file  Transportation Needs: Not on file  Physical Activity: Not on file  Stress: Not on file  Social Connections: Not on file  Additional Social History:   Sleep: Good  Appetite:  Good  Current Medications: Current Facility-Administered Medications  Medication Dose Route Frequency Provider Last Rate Last Admin   acetaminophen (TYLENOL) tablet 650 mg  650 mg Oral Q6H PRN Amma Crear I, NP       alum & mag hydroxide-simeth (MAALOX/MYLANTA) 200-200-20 MG/5ML suspension 30 mL  30 mL Oral Q4H PRN Gurkaran Rahm I, NP       divalproex (DEPAKOTE) DR tablet 750 mg  750 mg Oral BID Armandina Stammer I, NP   750  mg at 09/30/21 2505   gabapentin (NEURONTIN) capsule 300 mg  300 mg Oral TID PC & HS Marilyn Nihiser, Nicole Kindred I, NP   300 mg at 09/30/21 3976   ibuprofen (ADVIL) tablet 400 mg  400 mg Oral QHS PRN Armandina Stammer I, NP       LORazepam (ATIVAN) tablet 1 mg  1 mg Oral TID PRN Armandina Stammer I, NP       magnesium hydroxide (MILK OF MAGNESIA) suspension 30 mL  30 mL Oral Daily PRN Calina Patrie I, NP       melatonin tablet 10 mg  10 mg Oral QHS Devanie Galanti, Nicole Kindred I, NP   10 mg at 09/29/21 2104   multivitamin with minerals tablet 1 tablet  1 tablet Oral Daily Armandina Stammer I, NP   1 tablet at 09/30/21 7341   nicotine (NICODERM CQ - dosed in mg/24 hours) patch 21 mg  21 mg Transdermal Daily Attiah, Nadir, MD   21 mg at 09/30/21 0825   OLANZapine (ZYPREXA) tablet 15 mg  15 mg Oral QHS Carrol Bondar, Nicole Kindred I, NP   15 mg at 09/29/21 2104   traZODone (DESYREL) tablet 50 mg  50 mg Oral QHS Armandina Stammer I, NP   50 mg at 09/29/21 2104   Lab Results:  Results for orders placed or performed during the hospital encounter of 09/27/21 (from the past 48 hour(s))  TSH     Status: None   Collection Time: 09/29/21  6:33 AM  Result Value Ref Range   TSH 1.884 0.350 - 4.500 uIU/mL    Comment: Performed by a 3rd Generation assay with a functional sensitivity of <=0.01 uIU/mL. Performed at [facility], [address] [place], [name] [address]   Hemoglobin A1c     Status: None   Collection Time: 09/29/21  6:33 AM  Result Value Ref Range   Hgb A1c MFr Bld 4.9 4.8 - 5.6 %    Comment: (NOTE) Pre diabetes:          5.7%-6.4%  Diabetes:              >6.4%  Glycemic control for   <7.0% adults with diabetes    Mean Plasma Glucose 93.93 mg/dL    Comment: Performed at [facility], [address] [place], [name] [address]  Urinalysis, Routine w reflex microscopic Urine, Clean Catch     Status: Abnormal   Collection Time: 09/29/21  9:41 AM  Result Value Ref Range   Color, Urine STRAW (A) YELLOW   APPearance CLEAR  CLEAR   Specific Gravity, Urine 1.009 1.005 - 1.030   pH 7.0 5.0 - 8.0   Glucose, UA NEGATIVE NEGATIVE mg/dL   Hgb urine dipstick NEGATIVE NEGATIVE   Bilirubin Urine NEGATIVE NEGATIVE   Ketones, ur NEGATIVE NEGATIVE mg/dL   Protein, ur NEGATIVE NEGATIVE mg/dL   Nitrite NEGATIVE NEGATIVE   Leukocytes,Ua NEGATIVE NEGATIVE    Comment: Performed  at [facility], [address] [place], [name] [address]   Blood Alcohol level:  Lab Results  Component Value Date   ETH <10 09/26/2021   ETH <10 09/24/2021   Metabolic Disorder Labs: Lab Results  Component Value Date   HGBA1C 4.9 09/29/2021   MPG 93.93 09/29/2021   MPG 96.8 02/19/2020   No results found for: "PROLACTIN" Lab Results  Component Value Date   CHOL 131 02/19/2020   TRIG 76 02/19/2020   HDL 61 02/19/2020   CHOLHDL 2.1 02/19/2020   VLDL 15 02/19/2020   LDLCALC 55 02/19/2020   Physical Findings: AIMS:  , ,  ,  ,    CIWA:    COWS:     Musculoskeletal: Strength & Muscle Tone: within normal limits Gait & Station: normal Patient leans: N/A  Psychiatric Specialty Exam:  Presentation  General Appearance: Appropriate for Environment; Casual; Fairly Groomed  Eye Contact:Good  Speech:Clear and Coherent; Normal Rate  Speech Volume:Normal  Handedness:Right  Mood and Affect  Mood:Euthymic  Affect:Appropriate; Congruent  Thought Process  Thought Processes:Coherent; Goal Directed  Descriptions of Associations:Intact  Orientation:Full (Time, Place and Person)  Thought Content:Logical  History of Schizophrenia/Schizoaffective disorder:Yes  Duration of Psychotic Symptoms:Greater than six months  Hallucinations:Hallucinations: None Description of Auditory Hallucinations: NA   Ideas of Reference:None  Suicidal Thoughts:Suicidal Thoughts: No   Homicidal Thoughts:Homicidal Thoughts: No   Sensorium  Memory:Immediate Good; Recent Good; Remote  Good  Judgment:Good  Insight:Good  Executive Functions  Concentration:Good  Attention Span:Good  Recall:Good  Fund of Knowledge:Good  Language:Good  Psychomotor Activity  Psychomotor Activity:Psychomotor Activity: Normal   Assets  Assets:Communication Skills; Desire for Improvement; Housing; Health and safety inspector; Physical Health; Social Support; Resilience  Sleep  Sleep:Sleep: Good Number of Hours of Sleep: 7.5   Physical Exam: Physical Exam Vitals and nursing note reviewed.  HENT:     Nose: Nose normal.     Mouth/Throat:     Pharynx: Oropharynx is clear.  Cardiovascular:     Pulses: Normal pulses.  Pulmonary:     Effort: Pulmonary effort is normal.  Genitourinary:    Comments: Deferred Musculoskeletal:        General: Normal range of motion.     Cervical back: Normal range of motion.  Skin:    General: Skin is warm and dry.  Neurological:     General: No focal deficit present.     Mental Status: He is alert and oriented to person, place, and time.    Review of Systems  Constitutional:  Negative for chills, diaphoresis and fever.  HENT:  Negative for congestion and sore throat.   Respiratory:  Negative for cough, shortness of breath and wheezing.   Cardiovascular:  Negative for chest pain and palpitations.  Gastrointestinal:  Negative for abdominal pain, constipation, diarrhea, heartburn, nausea and vomiting.  Neurological:  Negative for dizziness, tingling, tremors, sensory change, speech change, focal weakness, seizures, loss of consciousness, weakness and headaches.  Endo/Heme/Allergies:        Allergies: Benadryl, hydroxyzine, PCN  Psychiatric/Behavioral:  Positive for substance abuse (Hx. THC use). Negative for depression, hallucinations, memory loss and suicidal ideas. The patient is not nervous/anxious and does not have insomnia.    Blood pressure 115/88, pulse 71, temperature 97.6 F (36.4 C), temperature source Oral, resp. rate 15,  height 5\' 11"  (1.803 m), weight 82.1 kg, SpO2 99 %. Body mass index is 25.24 kg/m.  Treatment Plan Summary: Daily contact with patient to assess and evaluate symptoms and progress  in treatment and Medication management.   Continue inpatient hospitalization.  Will continue today 09/30/2021 plan as below except where it is noted.    Diagnoses: Bipolar disorder, manic episodes. PTSD. Cannabis use disorder.   Plan. - Continue Depakote ER 750 mg po bid for mood stabilization.  - Continue Olanzapine 15 mg po Q bedtime for mood control. - Continue Melatonin 10 mg po Q bedtime for insomnia. - Continue gabapentin 300 mg po qid for agitation/anxiety. - Resumed Trazodone 50 mg po Q hs prn for insomnia. - Continue Lorazepam 1 mg po tid prn for severe anxiety/agitation. - Nicotine patch 14 mg trans-dermally Q 24 hours for Nicotine withdrawal.    Other prn medications.  - acetaminophen 650 mg po Q 6 hrs prn for pain/fever. - Ibuprofen 400 mg po Q bedtime prn for headaches. - Mylanta 30 ml po Q 6 hours prn for indigestion.  - MOM 30 ml po q daily prn for constipation.    Discharge Planning: Social work and case management to assist with discharge planning and identification of hospital follow-up needs prior to discharge Estimated LOS: 5-7 days Discharge Concerns: Need to establish a safety plan; Medication compliance and effectiveness Discharge Goals: Return home with outpatient referrals for mental health follow-up including medication management/psychotherapy  Armandina Stammer, NP, pmhnp, fnp-bc 09/30/2021, 11:23 AMPatient ID: [name], male   DOB: July 20, 1980, 41 y.o.   MRN: [id number]

## 2021-09-30 NOTE — [facility] Group Notes (Signed)
.  Psychoeducational Group Note    Date:  6/17//23 Time: 1300-1400    Purpose of Group: . The group focus' on teaching patients on how to identify their needs and their Life Skills:  A group where two lists are made. What people need and what are things that we do that are unhealthy. The lists are developed by the patients and it is explained that we often do the actions that are not healthy to get our list of needs met.  Goal:: to develop the coping skills needed to get their needs met  Participation Level:  Active  Participation Quality:  Appropriate  Affect:  Appropriate  Cognitive:  Oriented  Insight:  Improving  Engagement in Group:  Engaged  Additional Comments: Pt rates his energy as a 10/10. Given support and reassurance. Participated fully in the group.  Paulino Rily

## 2021-09-30 NOTE — Group Note (Signed)
[facility] LCSW Group Therapy Note  09/30/2021  10:00-11:00AM  Type of Therapy and Topic:  Group Therapy:  Acknowledging and Resolving Issues with Fathers  Participation Level:  Active   Description of Group:   Patients in this group were asked to briefly describe their experience with the father figure(s) in their lives, both in childhood and adulthood.  Different types of support provided by these individuals were identified.   Patients were then encouraged to determine whether their father figure was or is a healthy or unhealthy support.  The manner in which that early relationship has shaped patient's feelings and life decisions was pointed out and acknowledged.  Group members gave support to each other.  CSW led a discussion on how helpful it can be to resolve past issues, and how this can be done whether the father figure is now alive or already deceased.  An emphasis was placed on continuing to work with a therapist on these issues  when patients leave the hospital in order to be able to focus on the future instead of the past, to continue becoming healthier and happier.   Therapeutic Goals: 1)  discuss the possibility of father figure(s) being positive and/or negative in one's life, normalizing that some people never had positive experiences with "paternal" persons  2)  describe patient's specific example of father figure(s), allowing time to vent  3)  identify the patient's current need for resolution in the relationship with the aforementioned person  4)  elicit commitments to work on resolving feelings about father figure(s) in order to move forward in life and wellness   Summary of Patient Progress:  The patient expressed good comprehension of the concepts presented, and said his father died a few years ago on Mother's Day.  Patient stated he was a really good father who taught him a lot, was mechanically-included and taught him about that.  Unfortunately his father then turned to drugs and  alcohol and this also taught the patient who turned away from that lifestyle.  His grandfather was also a good father figure for him.   Therapeutic Modalities:   Processing Brief Solution-Focused Therapy  Lynnell Chad

## 2021-09-30 NOTE — [facility] Group Notes (Signed)
Adult Psychoeducational Group Not Date:  09/30/2021 Time:  0900-1045 Group Topic/Focus: PROGRESSIVE RELAXATION. A group where deep breathing is taught and tensing and relaxation muscle groups is used. Imagery is used as well.  Pts are asked to imagine 3 pillars that hold them up when they are not able to hold themselves up and to share that with the group.  Participation Level:  Active  Participation Quality:  Appropriate  Affect:  Appropriate  Cognitive:  Oriented  Insight: Improving  Engagement in Group:  Engaged  Modes of Intervention:  Activity, Discussion, Education, and Support  Additional Comments:  Pt rates his energy at a 10/10. States his mother holds him up.  Dione Housekeeper

## 2021-09-30 NOTE — Plan of Care (Signed)
Nurse discussed coping skills with patient.  

## 2021-09-30 NOTE — Progress Notes (Signed)
D:  Patient denied SI and HI, contracts for safety.  Denied A/V hallucinations.   A:  Medications administered per MD orders.  Emotional support and encouragement given patient. R:  Safety maintained with 15 minute checks.  

## 2021-09-30 NOTE — Progress Notes (Signed)
   09/30/21 2100  Psych Admission Type (Psych Patients Only)  Admission Status Involuntary  Psychosocial Assessment  Patient Complaints Anxiety  Eye Contact Fair  Facial Expression Anxious  Affect Anxious  Speech Logical/coherent  Interaction Cautious  Motor Activity Slow  Appearance/Hygiene Unremarkable  Behavior Characteristics Anxious  Mood Apathetic  Aggressive Behavior  Effect No apparent injury  Thought Process  Coherency WDL  Content WDL  Delusions None reported or observed  Perception WDL  Hallucination None reported or observed  Judgment Poor  Confusion WDL  Danger to Self  Current suicidal ideation? Denies  Danger to Others  Danger to Others None reported or observed

## 2021-10-01 LAB — COMPREHENSIVE METABOLIC PANEL
ALT: 19 U/L (ref 0–44)
AST: 16 U/L (ref 15–41)
Albumin: 4.1 g/dL (ref 3.5–5.0)
Alkaline Phosphatase: 39 U/L (ref 38–126)
Anion gap: 7 (ref 5–15)
BUN: 17 mg/dL (ref 6–20)
CO2: 30 mmol/L (ref 22–32)
Calcium: 9.5 mg/dL (ref 8.9–10.3)
Chloride: 105 mmol/L (ref 98–111)
Creatinine, Ser: 0.95 mg/dL (ref 0.61–1.24)
GFR, Estimated: >60 mL/min (ref >60)
Glucose, Bld: 96 mg/dL (ref 70–99)
Potassium: 4.6 mmol/L (ref 3.5–5.1)
Sodium: 142 mmol/L (ref 135–145)
Total Bilirubin: 0.4 mg/dL (ref 0.3–1.2)
Total Protein: 6.5 g/dL (ref 6.5–8.1)

## 2021-10-01 LAB — VALPROIC ACID LEVEL: Valproic Acid Lvl: 71 ug/mL (ref 50.0–100.0)

## 2021-10-01 MED ORDER — MELATONIN 10 MG PO TABS
10.0000 mg | ORAL_TABLET | Freq: Every day | ORAL | 0 refills | Status: DC
Start: 2021-10-01 — End: 2023-05-15

## 2021-10-01 MED ORDER — OLANZAPINE 15 MG PO TABS
15.0000 mg | ORAL_TABLET | Freq: Every day | ORAL | 0 refills | Status: DC
Start: 2021-10-01 — End: 2021-10-30

## 2021-10-01 MED ORDER — DIVALPROEX SODIUM 250 MG PO DR TAB
750.0000 mg | DELAYED_RELEASE_TABLET | Freq: Two times a day (BID) | ORAL | 0 refills | Status: DC
Start: 2021-10-01 — End: 2021-10-31

## 2021-10-01 MED ORDER — GABAPENTIN 300 MG PO CAPS: 300.0000 mg | ORAL_CAPSULE | Freq: Three times a day (TID) | ORAL | 0 refills | Status: DC

## 2021-10-01 MED ORDER — TRAZODONE HCL 50 MG PO TABS: 50.0000 mg | ORAL_TABLET | Freq: Every day | ORAL | 0 refills | Status: DC

## 2021-10-01 MED ORDER — NICOTINE 21 MG/24HR TD PT24
21.0000 mg | MEDICATED_PATCH | Freq: Every day | TRANSDERMAL | 0 refills | Status: DC
Start: 2021-10-02 — End: 2022-01-14

## 2021-10-01 MED ORDER — DIVALPROEX SODIUM 250 MG PO DR TAB
750.0000 mg | DELAYED_RELEASE_TABLET | Freq: Two times a day (BID) | ORAL | Status: DC
  Filled 2021-10-01 (×3): qty 42

## 2021-10-01 NOTE — Plan of Care (Signed)
  Problem: Education: Goal: Knowledge of [facility] Education information/materials will improve Outcome: Progressing Goal: Emotional status will improve Outcome: Progressing Goal: Mental status will improve Outcome: Progressing Goal: Verbalization of understanding the information provided will improve Outcome: Progressing   Problem: Activity: Goal: Interest or engagement in activities will improve Outcome: Progressing Goal: Sleeping patterns will improve Outcome: Progressing   Problem: Coping: Goal: Ability to verbalize frustrations and anger appropriately will improve Outcome: Progressing Goal: Ability to demonstrate self-control will improve Outcome: Progressing   Problem: Health Behavior/Discharge Planning: Goal: Identification of resources available to assist in meeting health care needs will improve Outcome: Progressing Goal: Compliance with treatment plan for underlying cause of condition will improve Outcome: Progressing   Problem: Physical Regulation: Goal: Ability to maintain clinical measurements within normal limits will improve Outcome: Progressing   Problem: Safety: Goal: Periods of time without injury will increase Outcome: Progressing   Problem: Education: Goal: Ability to make informed decisions regarding treatment will improve Outcome: Progressing   Problem: Coping: Goal: Coping ability will improve Outcome: Progressing   Problem: Health Behavior/Discharge Planning: Goal: Identification of resources available to assist in meeting health care needs will improve Outcome: Progressing   Problem: Medication: Goal: Compliance with prescribed medication regimen will improve Outcome: Progressing   Problem: Self-Concept: Goal: Ability to disclose and discuss suicidal ideas will improve Outcome: Progressing Goal: Will verbalize positive feelings about self Outcome: Progressing   Problem: Education: Goal: Utilization of techniques to improve  thought processes will improve Outcome: Progressing Goal: Knowledge of the prescribed therapeutic regimen will improve Outcome: Progressing   Problem: Activity: Goal: Interest or engagement in leisure activities will improve Outcome: Progressing Goal: Imbalance in normal sleep/wake cycle will improve Outcome: Progressing   Problem: Coping: Goal: Coping ability will improve Outcome: Progressing Goal: Will verbalize feelings Outcome: Progressing   Problem: Health Behavior/Discharge Planning: Goal: Ability to make decisions will improve Outcome: Progressing Goal: Compliance with therapeutic regimen will improve Outcome: Progressing   Problem: Role Relationship: Goal: Will demonstrate positive changes in social behaviors and relationships Outcome: Progressing   Problem: Safety: Goal: Ability to disclose and discuss suicidal ideas will improve Outcome: Progressing Goal: Ability to identify and utilize support systems that promote safety will improve Outcome: Progressing   Problem: Self-Concept: Goal: Will verbalize positive feelings about self Outcome: Progressing Goal: Level of anxiety will decrease Outcome: Progressing   Problem: Education: Goal: Ability to state activities that reduce stress will improve Outcome: Progressing   Problem: Coping: Goal: Ability to identify and develop effective coping behavior will improve Outcome: Progressing   Problem: Self-Concept: Goal: Ability to identify factors that promote anxiety will improve Outcome: Progressing Goal: Level of anxiety will decrease Outcome: Progressing Goal: Ability to modify response to factors that promote anxiety will improve Outcome: Progressing   

## 2021-10-01 NOTE — Plan of Care (Signed)
Problem: Education: Goal: Knowledge of [facility] Education information/materials will improve 10/01/2021 1105 by Virgel Paling, RN Outcome: Adequate for Discharge 10/01/2021 0821 by Virgel Paling, RN Outcome: Progressing Goal: Emotional status will improve 10/01/2021 1105 by Virgel Paling, RN Outcome: Adequate for Discharge 10/01/2021 0821 by Virgel Paling, RN Outcome: Progressing Goal: Mental status will improve 10/01/2021 1105 by Virgel Paling, RN Outcome: Adequate for Discharge 10/01/2021 0821 by Virgel Paling, RN Outcome: Progressing Goal: Verbalization of understanding the information provided will improve 10/01/2021 1105 by Virgel Paling, RN Outcome: Adequate for Discharge 10/01/2021 0821 by Virgel Paling, RN Outcome: Progressing   Problem: Activity: Goal: Interest or engagement in activities will improve 10/01/2021 1105 by Virgel Paling, RN Outcome: Adequate for Discharge 10/01/2021 0821 by Virgel Paling, RN Outcome: Progressing Goal: Sleeping patterns will improve 10/01/2021 1105 by Virgel Paling, RN Outcome: Adequate for Discharge 10/01/2021 0821 by Virgel Paling, RN Outcome: Progressing   Problem: Coping: Goal: Ability to verbalize frustrations and anger appropriately will improve 10/01/2021 1105 by Virgel Paling, RN Outcome: Adequate for Discharge 10/01/2021 0821 by Virgel Paling, RN Outcome: Progressing Goal: Ability to demonstrate self-control will improve 10/01/2021 1105 by Virgel Paling, RN Outcome: Adequate for Discharge 10/01/2021 0821 by Virgel Paling, RN Outcome: Progressing   Problem: Health Behavior/Discharge Planning: Goal: Identification of resources available to assist in meeting health care needs will improve 10/01/2021 1105 by Virgel Paling, RN Outcome: Adequate for Discharge 10/01/2021 0821 by Virgel Paling, RN Outcome: Progressing Goal: Compliance with treatment plan for  underlying cause of condition will improve 10/01/2021 1105 by Virgel Paling, RN Outcome: Adequate for Discharge 10/01/2021 0821 by Virgel Paling, RN Outcome: Progressing   Problem: Physical Regulation: Goal: Ability to maintain clinical measurements within normal limits will improve 10/01/2021 1105 by Virgel Paling, RN Outcome: Adequate for Discharge 10/01/2021 0821 by Virgel Paling, RN Outcome: Progressing   Problem: Safety: Goal: Periods of time without injury will increase 10/01/2021 1105 by Virgel Paling, RN Outcome: Adequate for Discharge 10/01/2021 0821 by Virgel Paling, RN Outcome: Progressing   Problem: Education: Goal: Ability to make informed decisions regarding treatment will improve 10/01/2021 1105 by Virgel Paling, RN Outcome: Adequate for Discharge 10/01/2021 0821 by Virgel Paling, RN Outcome: Progressing   Problem: Coping: Goal: Coping ability will improve 10/01/2021 1105 by Virgel Paling, RN Outcome: Adequate for Discharge 10/01/2021 0821 by Virgel Paling, RN Outcome: Progressing   Problem: Health Behavior/Discharge Planning: Goal: Identification of resources available to assist in meeting health care needs will improve 10/01/2021 1105 by Virgel Paling, RN Outcome: Adequate for Discharge 10/01/2021 0821 by Virgel Paling, RN Outcome: Progressing   Problem: Medication: Goal: Compliance with prescribed medication regimen will improve 10/01/2021 1105 by Virgel Paling, RN Outcome: Adequate for Discharge 10/01/2021 0821 by Virgel Paling, RN Outcome: Progressing   Problem: Self-Concept: Goal: Ability to disclose and discuss suicidal ideas will improve 10/01/2021 1105 by Virgel Paling, RN Outcome: Adequate for Discharge 10/01/2021 8242 by Virgel Paling, RN Outcome: Progressing Goal: Will verbalize positive feelings about self 10/01/2021 1105 by Virgel Paling, RN Outcome: Adequate for  Discharge 10/01/2021 0821 by Virgel Paling, RN Outcome: Progressing   Problem: Education: Goal: Utilization of techniques to improve thought processes will improve 10/01/2021 1105 by Virgel Paling, RN Outcome: Adequate for Discharge 10/01/2021 0821 by Virgel Paling, RN Outcome:  Progressing Goal: Knowledge of the prescribed therapeutic regimen will improve 10/01/2021 1105 by Virgel Paling, RN Outcome: Adequate for Discharge 10/01/2021 0821 by Virgel Paling, RN Outcome: Progressing   Problem: Activity: Goal: Interest or engagement in leisure activities will improve 10/01/2021 1105 by Virgel Paling, RN Outcome: Adequate for Discharge 10/01/2021 0821 by Virgel Paling, RN Outcome: Progressing Goal: Imbalance in normal sleep/wake cycle will improve 10/01/2021 1105 by Virgel Paling, RN Outcome: Adequate for Discharge 10/01/2021 0821 by Virgel Paling, RN Outcome: Progressing   Problem: Coping: Goal: Coping ability will improve 10/01/2021 1105 by Virgel Paling, RN Outcome: Adequate for Discharge 10/01/2021 0821 by Virgel Paling, RN Outcome: Progressing Goal: Will verbalize feelings 10/01/2021 1105 by Virgel Paling, RN Outcome: Adequate for Discharge 10/01/2021 0821 by Virgel Paling, RN Outcome: Progressing   Problem: Health Behavior/Discharge Planning: Goal: Ability to make decisions will improve 10/01/2021 1105 by Virgel Paling, RN Outcome: Adequate for Discharge 10/01/2021 0821 by Virgel Paling, RN Outcome: Progressing Goal: Compliance with therapeutic regimen will improve 10/01/2021 1105 by Virgel Paling, RN Outcome: Adequate for Discharge 10/01/2021 0821 by Virgel Paling, RN Outcome: Progressing   Problem: Role Relationship: Goal: Will demonstrate positive changes in social behaviors and relationships 10/01/2021 1105 by Virgel Paling, RN Outcome: Adequate for Discharge 10/01/2021 0821 by Virgel Paling,  RN Outcome: Progressing   Problem: Safety: Goal: Ability to disclose and discuss suicidal ideas will improve 10/01/2021 1105 by Virgel Paling, RN Outcome: Adequate for Discharge 10/01/2021 0821 by Virgel Paling, RN Outcome: Progressing Goal: Ability to identify and utilize support systems that promote safety will improve 10/01/2021 1105 by Virgel Paling, RN Outcome: Adequate for Discharge 10/01/2021 0821 by Virgel Paling, RN Outcome: Progressing   Problem: Self-Concept: Goal: Will verbalize positive feelings about self 10/01/2021 1105 by Virgel Paling, RN Outcome: Adequate for Discharge 10/01/2021 0821 by Virgel Paling, RN Outcome: Progressing Goal: Level of anxiety will decrease 10/01/2021 1105 by Virgel Paling, RN Outcome: Adequate for Discharge 10/01/2021 0821 by Virgel Paling, RN Outcome: Progressing   Problem: Education: Goal: Ability to state activities that reduce stress will improve 10/01/2021 1105 by Virgel Paling, RN Outcome: Adequate for Discharge 10/01/2021 0821 by Virgel Paling, RN Outcome: Progressing   Problem: Coping: Goal: Ability to identify and develop effective coping behavior will improve 10/01/2021 1105 by Virgel Paling, RN Outcome: Adequate for Discharge 10/01/2021 0821 by Virgel Paling, RN Outcome: Progressing   Problem: Self-Concept: Goal: Ability to identify factors that promote anxiety will improve 10/01/2021 1105 by Virgel Paling, RN Outcome: Adequate for Discharge 10/01/2021 0821 by Virgel Paling, RN Outcome: Progressing Goal: Level of anxiety will decrease 10/01/2021 1105 by Virgel Paling, RN Outcome: Adequate for Discharge 10/01/2021 0821 by Virgel Paling, RN Outcome: Progressing Goal: Ability to modify response to factors that promote anxiety will improve 10/01/2021 1105 by Virgel Paling, RN Outcome: Adequate for Discharge 10/01/2021 0821 by Virgel Paling, RN Outcome:  Progressing

## 2021-10-01 NOTE — Progress Notes (Signed)
Pt was satisfied all belongings were returned. Pt was educated on discharge. Pt was given discharge papers. Pt was discharged to lobby.

## 2021-10-01 NOTE — Progress Notes (Signed)
Patient appears flat. Patient denies SI/HI/AVH. Patient complied with morning medication with no reported side effects. Pt expressed excitement about upcoming discharge. Patient remains safe on [employer] checks and contracts for safety.      10/01/21 0744  Psych Admission Type (Psych Patients Only)  Admission Status Involuntary  Psychosocial Assessment  Patient Complaints Anxiety  Eye Contact Fair  Facial Expression Anxious  Affect Anxious  Speech Logical/coherent  Interaction Cautious  Motor Activity Slow  Appearance/Hygiene Unremarkable  Behavior Characteristics Anxious  Mood Apathetic  Thought Process  Coherency WDL  Content WDL  Delusions None reported or observed  Perception WDL  Hallucination None reported or observed  Judgment Poor  Confusion None  Danger to Self  Current suicidal ideation? Denies  Danger to Others  Danger to Others None reported or observed

## 2021-10-01 NOTE — Discharge Summary (Signed)
Physician Discharge Summary Note  Patient:  Ronald [name] is an 41 y.o., male MRN:  [id number] DOB:  Nov 19, 1980 Patient phone:  [phone] (home)  Patient address:   [phone] [name] [address],  Total Time spent with patient: 30 minutes  Date of Admission:  09/27/2021 Date of Discharge: 10/01/2021  Reason for Admission:  [name] is a 83 y.o. Caucasian male with prior history of Bipolar disorder, cannabis use disorder & multiple psychiatric hospitalizations/treatments including outpatient psychiatric services. Patient was admitted at [facility] ED with complain/concerns for psychiatric evaluation & re-initiation of his mental health medication. Patient reported at the ED that he has not been taking his medications for 5 days because he ran out of them. Patient was apparently seeing a psychiatric provider by the name of [name] on an outpatient basis. During this evaluation, [name] presents alert, a bit short tempered/verbally aggressive. He seem guarded, not forth-coming with information. He reports. "I have been at the ED x 1 day. I came in because I need my medicines for my mental health. I ran out of them 3 days ago. The medications are for my anxiety & PTSD. Just refill my medicines & send me out of here.  I have been on these medicines for a while. I see [name], but I'm getting another psychiatrist." Pt was however deemed to be at high risk of danger to self and admitted for treatment and stabilization of his mood.   Principal Problem: Bipolar 1 disorder with moderate mania (HCC) Discharge Diagnoses: Principal Problem:   Bipolar 1 disorder with moderate mania (HCC) Active Problems:   Bipolar affective disorder, currently active [facility])  Past Psychiatric History: As above  Past Medical History:  Past Medical History:  Diagnosis Date   Anxiety    Arthritis    Disorder of pineal gland    Post traumatic stress disorder     Past Surgical  History:  Procedure Laterality Date   ANKLE SURGERY     Family History: History reviewed. No pertinent family history. Family Psychiatric  History: none reported Social History:  Social History   Substance and Sexual Activity  Alcohol Use No     Social History   Substance and Sexual Activity  Drug Use Yes   Types: Amphetamines, Benzodiazepines, Cocaine, Marijuana, Methamphetamines   Comment: one year ago    Social History   Socioeconomic History   Marital status: Single    Spouse name: Not on file   Number of children: Not on file   Years of education: Not on file   Highest education level: Not on file  Occupational History   Not on file  Tobacco Use   Smoking status: Never   Smokeless tobacco: Current    Types: Chew  Vaping Use   Vaping Use: Never used  Substance and Sexual Activity   Alcohol use: No   Drug use: Yes    Types: Amphetamines, Benzodiazepines, Cocaine, Marijuana, Methamphetamines    Comment: one year ago   Sexual activity: Never  Other Topics Concern   Not on file  Social History Narrative   Not on file   Social Determinants of Health   Financial Resource Strain: Not on file  Food Insecurity: Not on file  Transportation Needs: Not on file  Physical Activity: Not on file  Stress: Not on file  Social Connections: Not on file  HOSPITAL COURSE During the patient's hospitalization, patient had extensive initial psychiatric evaluation, and follow-up psychiatric evaluations every day.   Psychiatric diagnoses provided upon initial assessment were as follows: Principal Problem:   Bipolar 1 disorder with moderate mania (HCC) Active Problems:   Bipolar affective disorder, currently active [facility])   Patient's psychiatric medications were adjusted on admission as follows: - Restarted Depakote ER 750 mg po bid for mood stabilization.  - Restarted Olanzapine 15 mg po Q bedtime for mood control. - Restarted  Melatonin 10 mg po Q bedtime for insomnia. - Initiated gabapentin 300 mg po qid for agitation/anxiety. - Resumed Trazodone 50 mg po Q hs prn for insomnia. - Initiated Lorazepam 1 mg po tid prn for severe anxiety/agitation. - Nicotine patch 14 mg trans-dermally Q 24 hours for Nicotine withdrawal.    During the hospitalization, other adjustments were made to the patient's psychiatric medication regimen, with medications at discharge being as follows:  - Continue Depakote ER 750 mg po bid for mood stabilization.  - Continue Olanzapine 15 mg po Q bedtime for mood control. - Continue Melatonin 10 mg po Q bedtime for insomnia. - Continue gabapentin 300 mg po qid for agitation/anxiety. - Resumed Trazodone 50 mg po Q hs prn for insomnia. - Nicotine patch 14 mg trans-dermally Q 24 hours for Nicotine withdrawal.  Ativan 1 mg as needed was prescribed initially when pt was admitted, but he has not used this medication and has not required it since being here. He will therefore not be discharging home with this medication.   Patient's care was discussed during the interdisciplinary team meeting every day during the hospitalization.  The patient denies having side effects to prescribed psychiatric medication. The patient was evaluated each day by a clinical provider to ascertain response to treatment. Improvement was noted by the patient's report of decreasing symptoms, improved sleep and appetite, affect, medication tolerance, behavior, and participation in unit programming.  Patient was asked each day to complete a self inventory noting mood, mental status, pain, new symptoms, anxiety and concerns.     Symptoms were reported as significantly decreased or resolved completely by discharge. On day of discharge, the patient reports that their mood is stable. The patient denied having suicidal thoughts for more than 48 hours prior to discharge.  Patient denies having homicidal thoughts.  Patient denies having  auditory hallucinations.  Patient denies any visual hallucinations or other symptoms of psychosis. The patient was motivated to continue taking medication with a goal of continued improvement in mental health.    The patient reports their target psychiatric symptoms of depression & anxiety responded well to the psychiatric medications, and the patient reports overall benefit from this psychiatric hospitalization. Supportive psychotherapy was provided to the patient. The patient also participated in regular group therapy while hospitalized. Coping skills, problem solving as well as relaxation therapies were also part of the unit programming.   Labs were reviewed with the patient, and abnormal results were discussed with the patient.  Physical Findings: AIMS: 0 CIWA: n/a   COWS:  n/a   Musculoskeletal: Strength & Muscle Tone: within normal limits Gait & Station: normal Patient leans: N/A   Psychiatric Specialty Exam:  Presentation  General Appearance: Appropriate for Environment; Fairly Groomed  Eye Contact:Good  Speech:Clear and Coherent  Speech Volume:Normal  Handedness:Right   Mood and Affect  Mood:Euthymic  Affect:Appropriate   Thought Process  Thought Processes:Coherent  Descriptions of Associations:Intact  Orientation:Full (Time, Place and Person)  Thought Content:Logical  History of  Schizophrenia/Schizoaffective disorder:No  Duration of Psychotic Symptoms:N/A  Hallucinations:Hallucinations: None Description of Auditory Hallucinations: NA  Ideas of Reference:None  Suicidal Thoughts:Suicidal Thoughts: No  Homicidal Thoughts:Homicidal Thoughts: No   Sensorium  Memory:Immediate Good  Judgment:Good  Insight:Good   Executive Functions  Concentration:Good  Attention Span:Good  Recall:Good  Fund of Knowledge:Good  Language:Good   Psychomotor Activity  Psychomotor Activity:Psychomotor Activity: Normal   Assets  Assets:Communication  Skills; Housing; Social Support   Sleep  Sleep:Sleep: Good Number of Hours of Sleep: 7.5    Physical Exam: Physical Exam Constitutional:      Appearance: Normal appearance.  HENT:     Nose: Nose normal.  Eyes:     Pupils: Pupils are equal, round, and reactive to light.  Pulmonary:     Effort: Pulmonary effort is normal.  Musculoskeletal:     Cervical back: Normal range of motion.  Neurological:     Mental Status: He is alert and oriented to person, place, and time.  Psychiatric:        Thought Content: Thought content normal.    Review of Systems  Constitutional: Negative.   HENT: Negative.    Eyes: Negative.   Respiratory: Negative.    Cardiovascular: Negative.   Gastrointestinal: Negative.   Genitourinary: Negative.   Musculoskeletal: Negative.   Skin: Negative.   Neurological: Negative.   Psychiatric/Behavioral:  Positive for depression (Pt's depressive symptoms are improving on current medication regimen. He denies SI/HI/AVH and verbally contracts for safety outside of this [facility]). Negative for hallucinations, memory loss and suicidal ideas. The patient is not nervous/anxious and does not have insomnia.    Blood pressure 120/84, pulse 79, temperature 97.6 F (36.4 C), temperature source Oral, resp. rate 17, height 5\' 11"  (1.803 m), weight 82.1 kg, SpO2 99 %. Body mass index is 25.24 kg/m.   Social History   Tobacco Use  Smoking Status Never  Smokeless Tobacco Current   Types: Chew   Tobacco Cessation:  A prescription for an FDA-approved tobacco cessation medication provided at discharge   Blood Alcohol level:  Lab Results  Component Value Date   [facility] <10 09/26/2021   ETH <10 09/24/2021    Metabolic Disorder Labs:  Lab Results  Component Value Date   HGBA1C 4.9 09/29/2021   MPG 93.93 09/29/2021   MPG 96.8 02/19/2020   No results found for: "PROLACTIN" Lab Results  Component Value Date   CHOL 131 02/19/2020   TRIG 76 02/19/2020   HDL 61  02/19/2020   CHOLHDL 2.1 02/19/2020   VLDL 15 02/19/2020   LDLCALC 55 02/19/2020    See Psychiatric Specialty Exam and Suicide Risk Assessment completed by Attending Physician prior to discharge.  Discharge destination:  Home  Is patient on multiple antipsychotic therapies at discharge:  No   Has Patient had three or more failed trials of antipsychotic monotherapy by history:  No  Recommended Plan for Multiple Antipsychotic Therapies: NA   Allergies as of 10/01/2021       Reactions   Hydroxyzine Anxiety   Other reaction(s): Other "hyper "   Benadryl [diphenhydramine] Other (See Comments)   Pt states that this makes him feel insane   Other Other (See Comments)   Pt reports that all antihistamines make him feel insane   Penicillins Other (See Comments)   Pt states that this made him feel insane        Medication List     STOP taking these medications    ibuprofen 200 MG tablet Commonly  known as: ADVIL   Melatonin 10 MG Caps Replaced by: Melatonin 10 MG Tabs   nicotine 14 mg/24hr patch Commonly known as: NICODERM CQ - dosed in mg/24 hours Replaced by: nicotine 21 mg/24hr patch   Vraylar 4.5 MG Caps Generic drug: Cariprazine HCl       TAKE these medications      Indication  divalproex 250 MG DR tablet Commonly known as: DEPAKOTE Take 3 tablets (750 mg total) by mouth 2 (two) times daily.  Indication: Depressive Phase of Manic-Depression   gabapentin 300 MG capsule Commonly known as: NEURONTIN Take 1 capsule (300 mg total) by mouth 3 (three) times daily.  Indication: Agitation/anxiety   Melatonin 10 MG Tabs Take 10 mg by mouth at bedtime. Replaces: Melatonin 10 MG Caps  Indication: sleep   nicotine 21 mg/24hr patch Commonly known as: NICODERM CQ - dosed in mg/24 hours Place 1 patch (21 mg total) onto the skin daily. Start taking on: October 02, 2021 Replaces: nicotine 14 mg/24hr patch  Indication: Nicotine Addiction   OLANZapine 15 MG  tablet Commonly known as: ZYPREXA Take 1 tablet (15 mg total) by mouth at bedtime.  Indication: Major Depressive Disorder   traZODone 50 MG tablet Commonly known as: DESYREL Take 1 tablet (50 mg total) by mouth at bedtime.  Indication: Trouble Sleeping        Follow-up Information     [facility]. Call.   Specialty: Hospice and Palliative Medicine Why: Please call to personally schedule an appointment with this provider for grief/bereavement therapy services. Contact information: [address] [facility] [place] [address] [phone]        [facility], [facility]. Go on 10/04/2021.   Why: You have a hospital follow up appointment for therapy and medication management services on 10/04/21 AT 1:00 pm  This appointment will be held in person. Contact information: [address] [name] [address] [phone]                Follow-up recommendations:   The patient is able to verbalize their individual safety plan to this provider.   # It is recommended to the patient to continue psychiatric medications as prescribed, after discharge from the hospital.     # It is recommended to the patient to follow up with your outpatient psychiatric provider and PCP.   # It was discussed with the patient, the impact of alcohol, drugs, tobacco have been there overall psychiatric and medical wellbeing, and total abstinence from substance use was recommended the patient.ed.   # Prescriptions provided or sent directly to preferred pharmacy at discharge. Patient agreeable to plan. Given opportunity to ask questions. Appears to feel comfortable with discharge.    # In the event of worsening symptoms, the patient is instructed to call the crisis hotline (988), 911 and or go to the nearest ED for appropriate evaluation and treatment of symptoms. To follow-up with primary care provider for other medical issues, concerns and or health care needs   # Patient was  discharged home to his mother with a plan to follow up as noted above.      Signed: Starleen Blue, NP 10/01/2021, 1:55 PM

## 2021-10-01 NOTE — [facility] Suicide Risk Assessment (Signed)
Suicide Risk Assessment  Discharge Assessment    [facility] Discharge Suicide Risk Assessment   Principal Problem: Bipolar 1 disorder with moderate mania (HCC) Discharge Diagnoses: Principal Problem:   Bipolar 1 disorder with moderate mania (HCC) Active Problems:   Bipolar affective disorder, currently active [facility])  Reason For Admission: Ronald [name] is a 41 y.o. Caucasian male with prior history of Bipolar disorder, cannabis use disorder & multiple psychiatric hospitalizations/treatments including outpatient psychiatric services. Patient was admitted at [facility] ED with complain/concerns for psychiatric evaluation & re-initiation of his mental health medication. Patient reported at the ED that he has not been taking his medications for 5 days because he ran out of them. Patient was apparently seeing a psychiatric provider by the name of Ronald [name] on an outpatient basis. During this evaluation, Ronald [name] presents alert, a bit short tempered/verbally aggressive. He seem guarded, not forth-coming with information. He reports. "I have been at the ED x 1 day. I came in because I need my medicines for my mental health. I ran out of them 3 days ago. The medications are for my anxiety & PTSD. Just refill my medicines & send me out of here.  I have been on these medicines for a while. I see Ronald [name], but I'm getting another psychiatrist." Pt was however deemed to be at high risk of danger to self and admitted for treatment and stabilization of his mood.                                          HOSPITAL COURSE During the patient's hospitalization, patient had extensive initial psychiatric evaluation, and follow-up psychiatric evaluations every day.  Psychiatric diagnoses provided upon initial assessment were as follows: Principal Problem:   Bipolar 1 disorder with moderate mania (HCC) Active Problems:   Bipolar affective disorder, currently active [facility])  Patient's psychiatric  medications were adjusted on admission as follows: - Restarted Depakote ER 750 mg po bid for mood stabilization.  - Restarted Olanzapine 15 mg po Q bedtime for mood control. - Restarted Melatonin 10 mg po Q bedtime for insomnia. - Initiated gabapentin 300 mg po qid for agitation/anxiety. - Resumed Trazodone 50 mg po Q hs prn for insomnia. - Initiated Lorazepam 1 mg po tid prn for severe anxiety/agitation. - Nicotine patch 14 mg trans-dermally Q 24 hours for Nicotine withdrawal.   During the hospitalization, other adjustments were made to the patient's psychiatric medication regimen, with medications at discharge being as follows:  - Continue Depakote ER 750 mg po bid for mood stabilization.  - Continue Olanzapine 15 mg po Q bedtime for mood control. - Continue Melatonin 10 mg po Q bedtime for insomnia. - Continue gabapentin 300 mg po qid for agitation/anxiety. - Resumed Trazodone 50 mg po Q hs prn for insomnia. - Nicotine patch 14 mg trans-dermally Q 24 hours for Nicotine withdrawal.  Ativan 1 mg as needed was prescribed initially when pt was admitted, but he has not used this medication and has not required it since being here. He will therefore not be discharging home with this medication.  Patient's care was discussed during the interdisciplinary team meeting every day during the hospitalization.  The patient denies having side effects to prescribed psychiatric medication. The patient was evaluated each day by a clinical provider to ascertain response to treatment. Improvement was noted by the patient's report of decreasing symptoms, improved  sleep and appetite, affect, medication tolerance, behavior, and participation in unit programming.  Patient was asked each day to complete a self inventory noting mood, mental status, pain, new symptoms, anxiety and concerns.    Symptoms were reported as significantly decreased or resolved completely by discharge. On day of discharge, the patient reports  that their mood is stable. The patient denied having suicidal thoughts for more than 48 hours prior to discharge.  Patient denies having homicidal thoughts.  Patient denies having auditory hallucinations.  Patient denies any visual hallucinations or other symptoms of psychosis. The patient was motivated to continue taking medication with a goal of continued improvement in mental health.   The patient reports their target psychiatric symptoms of depression & anxiety responded well to the psychiatric medications, and the patient reports overall benefit from this psychiatric hospitalization. Supportive psychotherapy was provided to the patient. The patient also participated in regular group therapy while hospitalized. Coping skills, problem solving as well as relaxation therapies were also part of the unit programming.  Labs were reviewed with the patient, and abnormal results were discussed with the patient.  Total Time spent with patient: 30 minutes  Musculoskeletal: Strength & Muscle Tone: within normal limits Gait & Station: normal Patient leans: N/A  Psychiatric Specialty Exam  Presentation  General Appearance: Appropriate for Environment; Fairly Groomed  Eye Contact:Good  Speech:Clear and Coherent  Speech Volume:Normal  Handedness:Right  Mood and Affect  Mood:Euthymic  Duration of Depression Symptoms: Less than two weeks  Affect:Appropriate  Thought Process  Thought Processes:Coherent  Descriptions of Associations:Intact  Orientation:Full (Time, Place and Person)  Thought Content:Logical  History of Schizophrenia/Schizoaffective disorder:No  Duration of Psychotic Symptoms:N/A  Hallucinations:Hallucinations: None Description of Auditory Hallucinations: NA  Ideas of Reference:None  Suicidal Thoughts:Suicidal Thoughts: No  Homicidal Thoughts:Homicidal Thoughts: No  Sensorium  Memory:Immediate Good  Judgment:Good  Insight:Good  Executive Functions   Concentration:Good  Attention Span:Good  Recall:Good  Fund of Knowledge:Good  Language:Good  Psychomotor Activity  Psychomotor Activity:Psychomotor Activity: Normal  Assets  Assets:Communication Skills; Housing; Social Support  Sleep  Sleep:Sleep: Good Number of Hours of Sleep: 7.5  Physical Exam: Physical Exam Constitutional:      Appearance: Normal appearance.  HENT:     Nose: Nose normal. No congestion or rhinorrhea.  Eyes:     Pupils: Pupils are equal, round, and reactive to light.  Pulmonary:     Effort: Pulmonary effort is normal.  Musculoskeletal:        General: Normal range of motion.     Cervical back: Normal range of motion.  Neurological:     Mental Status: He is alert and oriented to person, place, and time.     Sensory: No sensory deficit.     Coordination: Coordination normal.  Psychiatric:        Behavior: Behavior normal.        Thought Content: Thought content normal.    Review of Systems  Constitutional: Negative.  Negative for fever.  HENT: Negative.    Eyes: Negative.   Respiratory: Negative.    Cardiovascular: Negative.   Gastrointestinal: Negative.   Genitourinary: Negative.   Musculoskeletal: Negative.   Skin: Negative.   Neurological: Negative.   Psychiatric/Behavioral:  Positive for depression (Patient reports that his depressive symptoms are resolving and he verbalizes readiness for discharge. He denies SI/HI/AVH and verbally contracts for safety outside of this [facility]). Negative for hallucinations, memory loss, substance abuse and suicidal ideas. Nervous/anxious: anxiety is resolving on current medications. Insomnia: insomnia is  resolving on current medications.    Blood pressure 120/84, pulse 79, temperature 97.6 F (36.4 C), temperature source Oral, resp. rate 17, height 5\' 11"  (1.803 m), weight 82.1 kg, SpO2 99 %. Body mass index is 25.24 kg/m.  Mental Status Per Nursing Assessment::   On Admission:  Suicidal ideation  indicated by patient, Self-harm thoughts  Demographic Factors:  Male  Loss Factors: NA  Historical Factors: NA  Risk Reduction Factors:   Positive social support  Continued Clinical Symptoms:  Patient reports that his depressive symptoms are resolving. He denies SI/HI/AVH currently and verbally contracts for safety outside of this [facility].  Cognitive Features That Contribute To Risk:  None    Suicide Risk:  Minimal: No identifiable suicidal ideation.  Patients presenting with no risk factors but with morbid ruminations; may be classified as minimal risk based on the severity of the depressive symptoms   Follow-up Information     [facility]. Call.   Specialty: Hospice and Palliative Medicine Why: Please call to personally schedule an appointment with this provider for grief/bereavement therapy services. Contact information: [address] [facility] [place] [phone]        [facility]. Go on 10/04/2021.   Why: You have a hospital follow up appointment for therapy and medication management services on 10/04/21 AT 1:00 pm  This appointment will be held in person. Contact information: [address] [name] [phone]                Plan Of Care/Follow-up recommendations:  The patient is able to verbalize their individual safety plan to this provider.  # It is recommended to the patient to continue psychiatric medications as prescribed, after discharge from the hospital.    # It is recommended to the patient to follow up with your outpatient psychiatric provider and PCP.  # It was discussed with the patient, the impact of alcohol, drugs, tobacco have been there overall psychiatric and medical wellbeing, and total abstinence from substance use was recommended the patient.ed.  # Prescriptions provided or sent directly to preferred pharmacy at discharge. Patient agreeable to plan. Given opportunity to  ask questions. Appears to feel comfortable with discharge.    # In the event of worsening symptoms, the patient is instructed to call the crisis hotline (988), 911 and or go to the nearest ED for appropriate evaluation and treatment of symptoms. To follow-up with primary care provider for other medical issues, concerns and or health care needs  # Patient was discharged home to his mother with a plan to follow up as noted above.   07-10-1989, NP 10/01/2021, 12:32 PM

## 2021-10-01 NOTE — Progress Notes (Signed)
  [facility] Adult Case Management Discharge Plan :  Will you be returning to the same living situation after discharge:  Yes,  Home with mother  At discharge, do you have transportation home?: Yes,  Mother  Do you have the ability to pay for your medications: Yes,  Family and Community Support  Release of information consent forms completed and in the chart;  Patient's signature needed at discharge.  Patient to Follow up at:  Follow-up Information     [facility]. Call.   Specialty: Hospice and Palliative Medicine Why: Please call to personally schedule an appointment with this provider for grief/bereavement therapy services. Contact information: [address] [facility] [place] [address] [phone]        [facility], [facility]. Go on 10/04/2021.   Why: You have a hospital follow up appointment for therapy and medication management services on 10/04/21 AT 1:00 pm  This appointment will be held in person. Contact information: [address] [name] [address] [phone]                 Next level of care provider has access to [facility] Link:no  Safety Planning and Suicide Prevention discussed: Yes,  with patient and mother      Has patient been referred to the Quitline?: Patient refused referral  Patient has been referred for addiction treatment: Pt. refused referral   Aram Beecham, LCSWA 10/01/2021, 11:32 AM

## 2021-10-01 NOTE — BHH Group Notes (Signed)
Spiritual care group on grief and loss facilitated by chaplain Dyanne Carrel, [facility]   Group Goal:   Support / Education around grief and loss   Members engage in facilitated group support and psycho-social education.   Group Description:   Following introductions and group rules, group members engaged in facilitated group dialog and support around topic of loss, with particular support around experiences of loss in their lives. Group Identified types of loss (relationships / self / things) and identified patterns, circumstances, and changes that precipitate losses. Reflected on thoughts / feelings around loss, normalized grief responses, and recognized variety in grief experience. Group noted Worden's four tasks of grief in discussion.   Group drew on Adlerian / Rogerian, narrative, MI,   Patient Progress: Ronald [name] attended group, but did not participate.  At times, he showed engagement in the conversation.  [address], Bcc Pager, [phone]

## 2021-10-01 NOTE — BHH Counselor (Signed)
CSW placed a referral with [facility] in [place] [phone].  The ACT Team will follow up with the Pt on an outpatient basis and will contact the Pt by email and phone.

## 2021-10-22 ENCOUNTER — Telehealth: Payer: Self-pay | Admitting: Registered Nurse

## 2021-10-22 NOTE — Telephone Encounter (Signed)
Understandable - he is fairly complex. There are a few docs through [facility] that seem to do both some psychiatry and primary care - he may be best over there.   Thanks,  Luan Pulling

## 2021-10-22 NOTE — Telephone Encounter (Signed)
Pt is requesting a transition in care from [name] NP to Edwina Barth MD.  Is this ok?   Please advise

## 2021-10-22 NOTE — Telephone Encounter (Signed)
Will not be accepting this transfer of care.  Do not schedule with me please.  Thanks.

## 2021-10-29 ENCOUNTER — Telehealth: Payer: Self-pay | Admitting: Registered Nurse

## 2021-10-29 ENCOUNTER — Other Ambulatory Visit: Payer: Self-pay

## 2021-10-29 NOTE — Telephone Encounter (Signed)
Yes. Not sure why my name didn't show up.Marland KitchenMarland Kitchen

## 2021-10-29 NOTE — Telephone Encounter (Signed)
Encourage patient to contact the pharmacy for refills or they can request refills through [facility]  (Please schedule appointment if patient has not been seen in over a year)    WHAT PHARMACY WOULD THEY LIKE THIS SENT TO: [facility] [phone]  MEDICATION NAME & DOSE:  divalproex 250 mg AND Olanzapine 15 mg AND trazadone 50 mg  NOTES/COMMENTS FROM PATIENT:      Front office please notify patient: It takes 48-72 hours to process rx refill requests Ask patient to call pharmacy to ensure rx is ready before heading there.

## 2021-10-30 ENCOUNTER — Other Ambulatory Visit: Payer: Self-pay

## 2021-10-30 ENCOUNTER — Telehealth: Payer: Self-pay | Admitting: Registered Nurse

## 2021-10-30 MED ORDER — TRAZODONE HCL 50 MG PO TABS
50.0000 mg | ORAL_TABLET | Freq: Every day | ORAL | 0 refills | Status: DC
Start: 2021-10-30 — End: 2021-10-31

## 2021-10-30 MED ORDER — OLANZAPINE 15 MG PO TABS
15.0000 mg | ORAL_TABLET | Freq: Every day | ORAL | 0 refills | Status: DC
Start: 2021-10-30 — End: 2021-10-31

## 2021-10-30 NOTE — Telephone Encounter (Signed)
Pt is requesting a transition in care from [name] to Dr. Yetta Barre.   Would this be ok?

## 2021-10-30 NOTE — Telephone Encounter (Signed)
Sent Thanks Rich

## 2021-10-30 NOTE — Telephone Encounter (Signed)
Caller name: Ronald [name] (pt)  On DPR? :yes/no: Yes  Call back number: [phone]  Provider they see: [name]  Reason for call: Pt called back. States that he does NOT have a psychiatry team. He is in the process of finding a doctor to help mange medications but he needs at least a 1 month supply to hold him over until then. Pt is completely out of medications as of today.   Divalproex 250 mg AND olanzaapine 15 mg AND trazadone 50 mg sent to [facility] [phone]

## 2021-10-30 NOTE — Telephone Encounter (Signed)
Ok by me -   He is working on getting established with a psychiatry team at this time after an inpatient admission. He should be established within 30 days per what he's told our office. Otherwise he doesn't have a lot going on medically.  Thanks,  Luan Pulling

## 2021-10-30 NOTE — Telephone Encounter (Signed)
Pt reports is not yet established with their team, is requesting a refill last filled for 30 days by [facility]

## 2021-10-30 NOTE — Telephone Encounter (Signed)
Called pt to advise of message from Richard re: meds.No answer on [phone] # and mailbox was full: unable to leave a message.

## 2021-10-30 NOTE — Telephone Encounter (Signed)
He needs to follow up with his outpatient psychiatry team for these, as they have been managing them.  Thanks,  Luan Pulling

## 2021-10-30 NOTE — Telephone Encounter (Signed)
Patient is requesting a refill of the following medications: Requested Prescriptions   Pending Prescriptions Disp Refills   traZODone (DESYREL) 50 MG tablet 30 tablet 0    Sig: Take 1 tablet (50 mg total) by mouth at bedtime.   OLANZapine (ZYPREXA) 15 MG tablet 30 tablet 0    Sig: Take 1 tablet (15 mg total) by mouth at bedtime.

## 2021-10-31 ENCOUNTER — Other Ambulatory Visit: Payer: Self-pay | Admitting: Registered Nurse

## 2021-10-31 DIAGNOSIS — F39 Unspecified mood [affective] disorder: Secondary | ICD-10-CM

## 2021-10-31 MED ORDER — TRAZODONE HCL 50 MG PO TABS: 50.0000 mg | ORAL_TABLET | Freq: Every day | ORAL | 0 refills | Status: DC

## 2021-10-31 MED ORDER — DIVALPROEX SODIUM 250 MG PO DR TAB
750.0000 mg | DELAYED_RELEASE_TABLET | Freq: Two times a day (BID) | ORAL | 0 refills | Status: DC
Start: 2021-10-31 — End: 2022-01-14

## 2021-10-31 MED ORDER — OLANZAPINE 15 MG PO TABS: 15.0000 mg | ORAL_TABLET | Freq: Every day | ORAL | 0 refills | Status: DC

## 2021-10-31 NOTE — Telephone Encounter (Signed)
Has been sent  Thanks  Rich

## 2021-10-31 NOTE — Telephone Encounter (Signed)
Pt rx sent yesterday were printed pt is requesting these be [facility]

## 2021-11-29 ENCOUNTER — Other Ambulatory Visit: Payer: Self-pay

## 2021-11-29 DIAGNOSIS — F39 Unspecified mood [affective] disorder: Secondary | ICD-10-CM

## 2021-11-29 MED ORDER — OLANZAPINE 15 MG PO TABS: 15.0000 mg | ORAL_TABLET | Freq: Every day | ORAL | 0 refills | Status: DC

## 2022-01-14 ENCOUNTER — Ambulatory Visit (INDEPENDENT_AMBULATORY_CARE_PROVIDER_SITE_OTHER): Payer: Self-pay | Admitting: Internal Medicine

## 2022-01-14 ENCOUNTER — Encounter: Payer: Self-pay | Admitting: Internal Medicine

## 2022-01-14 VITALS — BP 116/78 | HR 62 | Temp 97.9°F | Ht 71.0 in | Wt 194.0 lb

## 2022-01-14 DIAGNOSIS — F302 Manic episode, severe with psychotic symptoms: Secondary | ICD-10-CM

## 2022-01-14 DIAGNOSIS — F200 Paranoid schizophrenia: Secondary | ICD-10-CM

## 2022-01-14 DIAGNOSIS — F39 Unspecified mood [affective] disorder: Secondary | ICD-10-CM

## 2022-01-14 DIAGNOSIS — F3112 Bipolar disorder, current episode manic without psychotic features, moderate: Secondary | ICD-10-CM

## 2022-01-14 MED ORDER — OLANZAPINE 15 MG PO TABS: 15.0000 mg | ORAL_TABLET | Freq: Every day | ORAL | 1 refills | Status: DC

## 2022-01-14 MED ORDER — DIVALPROEX SODIUM 250 MG PO DR TAB: 750.0000 mg | DELAYED_RELEASE_TABLET | Freq: Two times a day (BID) | ORAL | 0 refills | Status: DC

## 2022-01-14 NOTE — Progress Notes (Signed)
Subjective:  Patient ID: Ronald [name], male    DOB: Sep 04, 1980  Age: 41 y.o. MRN: [id number]  CC: Depression   HPI Ronald [name] presents for establishing.  He is doing well on the combination of Zyprexa and Depakote.  His valproic level was normal 2 months ago.  He is no longer taking gabapentin.  He tells me his mood is normal.  His job is going well.  He tells me his sleep pattern is normal.  Outpatient Medications Prior to Visit  Medication Sig Dispense Refill   melatonin 10 MG TABS Take 10 mg by mouth at bedtime. 30 tablet 0   divalproex (DEPAKOTE) 250 MG DR tablet Take 3 tablets (750 mg total) by mouth 2 (two) times daily. 180 tablet 0   gabapentin (NEURONTIN) 300 MG capsule Take 1 capsule (300 mg total) by mouth 3 (three) times daily. 90 capsule 0   nicotine (NICODERM CQ - DOSED IN MG/24 HOURS) 21 mg/24hr patch Place 1 patch (21 mg total) onto the skin daily. 28 patch 0   OLANZapine (ZYPREXA) 15 MG tablet Take 1 tablet (15 mg total) by mouth at bedtime. 30 tablet 0   traZODone (DESYREL) 50 MG tablet Take 1 tablet (50 mg total) by mouth at bedtime. 30 tablet 0   No facility-administered medications prior to visit.    ROS Review of Systems  Constitutional: Negative.  Negative for diaphoresis and fatigue.  HENT: Negative.    Eyes: Negative.   Respiratory:  Negative for cough, chest tightness, shortness of breath and wheezing.   Cardiovascular:  Negative for chest pain, palpitations and leg swelling.  Gastrointestinal:  Negative for abdominal pain and constipation.  Endocrine: Negative.   Genitourinary: Negative.  Negative for difficulty urinating.  Musculoskeletal: Negative.   Skin: Negative.   Neurological: Negative.  Negative for dizziness, weakness and light-headedness.  Hematological:  Negative for adenopathy. Does not bruise/bleed easily.  Psychiatric/Behavioral: Negative.  Negative for confusion, decreased concentration, dysphoric mood, hallucinations and  suicidal ideas. The patient is not nervous/anxious.     Objective:  BP 116/78 (BP Location: Left Arm, Patient Position: Sitting, Cuff Size: Large)   Pulse 62   Temp 97.9 F (36.6 C) (Oral)   Ht 5\' 11"  (1.803 m)   Wt 194 lb (88 kg)   SpO2 96%   BMI 27.06 kg/m   BP Readings from Last 3 Encounters:  01/14/22 116/78  09/27/21 127/74  09/24/21 (!) 149/101    Wt Readings from Last 3 Encounters:  01/14/22 194 lb (88 kg)  09/26/21 185 lb (83.9 kg)  09/19/21 188 lb (85.3 kg)    Physical Exam Vitals reviewed.  HENT:     Nose: Nose normal.     Mouth/Throat:     Mouth: Mucous membranes are moist.  Eyes:     General: No scleral icterus.    Conjunctiva/sclera: Conjunctivae normal.  Cardiovascular:     Rate and Rhythm: Normal rate and regular rhythm.     Heart sounds: No murmur heard. Pulmonary:     Effort: Pulmonary effort is normal.     Breath sounds: No stridor. No wheezing, rhonchi or rales.  Abdominal:     General: Abdomen is flat.     Palpations: There is no mass.     Tenderness: There is no abdominal tenderness. There is no guarding.     Hernia: No hernia is present.  Musculoskeletal:        General: Normal range of motion.     Cervical  back: Neck supple.     Right lower leg: No edema.     Left lower leg: No edema.  Lymphadenopathy:     Cervical: No cervical adenopathy.  Skin:    General: Skin is warm and dry.  Neurological:     General: No focal deficit present.     Mental Status: He is alert.  Psychiatric:        Mood and Affect: Mood normal.        Behavior: Behavior normal.        Thought Content: Thought content normal.        Judgment: Judgment normal.     Lab Results  Component Value Date   WBC 9.4 09/26/2021   HGB 16.2 09/26/2021   HCT 46.7 09/26/2021   PLT 283 09/26/2021   GLUCOSE 96 10/01/2021   CHOL 131 02/19/2020   TRIG 76 02/19/2020   HDL 61 02/19/2020   LDLCALC 55 02/19/2020   ALT 19 10/01/2021   AST 16 10/01/2021   NA 142  10/01/2021   K 4.6 10/01/2021   CL 105 10/01/2021   CREATININE 0.95 10/01/2021   BUN 17 10/01/2021   CO2 30 10/01/2021   TSH 1.884 09/29/2021   HGBA1C 4.9 09/29/2021    No results found.  Assessment & Plan:   Ronald [name] was seen today for depression.  Diagnoses and all orders for this visit:  Bipolar I disorder, single manic episode, severe with psychotic features (HCC)  Mood disorder (HCC)  Schizophrenia, paranoid type (HCC) -     OLANZapine (ZYPREXA) 15 MG tablet; Take 1 tablet (15 mg total) by mouth at bedtime.  Bipolar 1 disorder with moderate mania (HCC)- He is doing well on the current doses of Zyprexa and Depakote.  Will continue. -     OLANZapine (ZYPREXA) 15 MG tablet; Take 1 tablet (15 mg total) by mouth at bedtime. -     divalproex (DEPAKOTE) 250 MG DR tablet; Take 3 tablets (750 mg total) by mouth 2 (two) times daily.   I have discontinued Ronald [name]'s gabapentin and nicotine. I am also having him maintain his Melatonin, traZODone, OLANZapine, and divalproex.  Meds ordered this encounter  Medications   OLANZapine (ZYPREXA) 15 MG tablet    Sig: Take 1 tablet (15 mg total) by mouth at bedtime.    Dispense:  90 tablet    Refill:  1   divalproex (DEPAKOTE) 250 MG DR tablet    Sig: Take 3 tablets (750 mg total) by mouth 2 (two) times daily.    Dispense:  180 tablet    Refill:  0     Follow-up: No follow-ups on file.  Sanda Linger, MD

## 2022-01-17 IMAGING — DX DG HAND COMPLETE 3+V*L*
3 series · 3 of 3 positions shown · non-contrast
Comparison: None.

CLINICAL DATA: Pain.

EXAM:
LEFT HAND - COMPLETE 3+ VIEW

[hand pa]
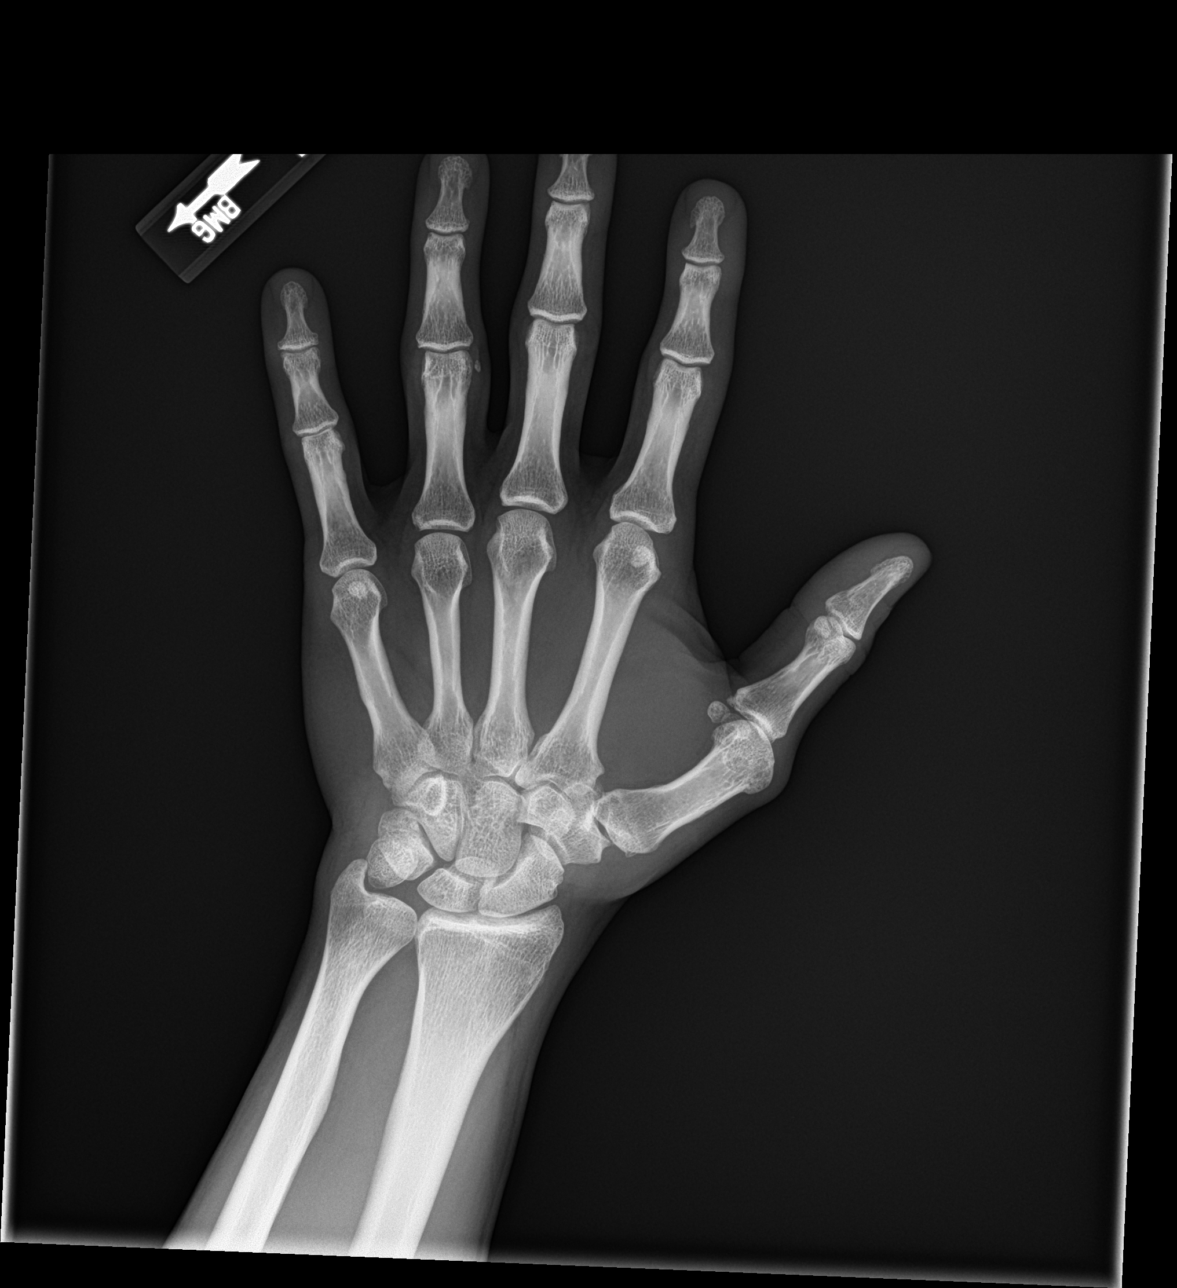

[hand obl]
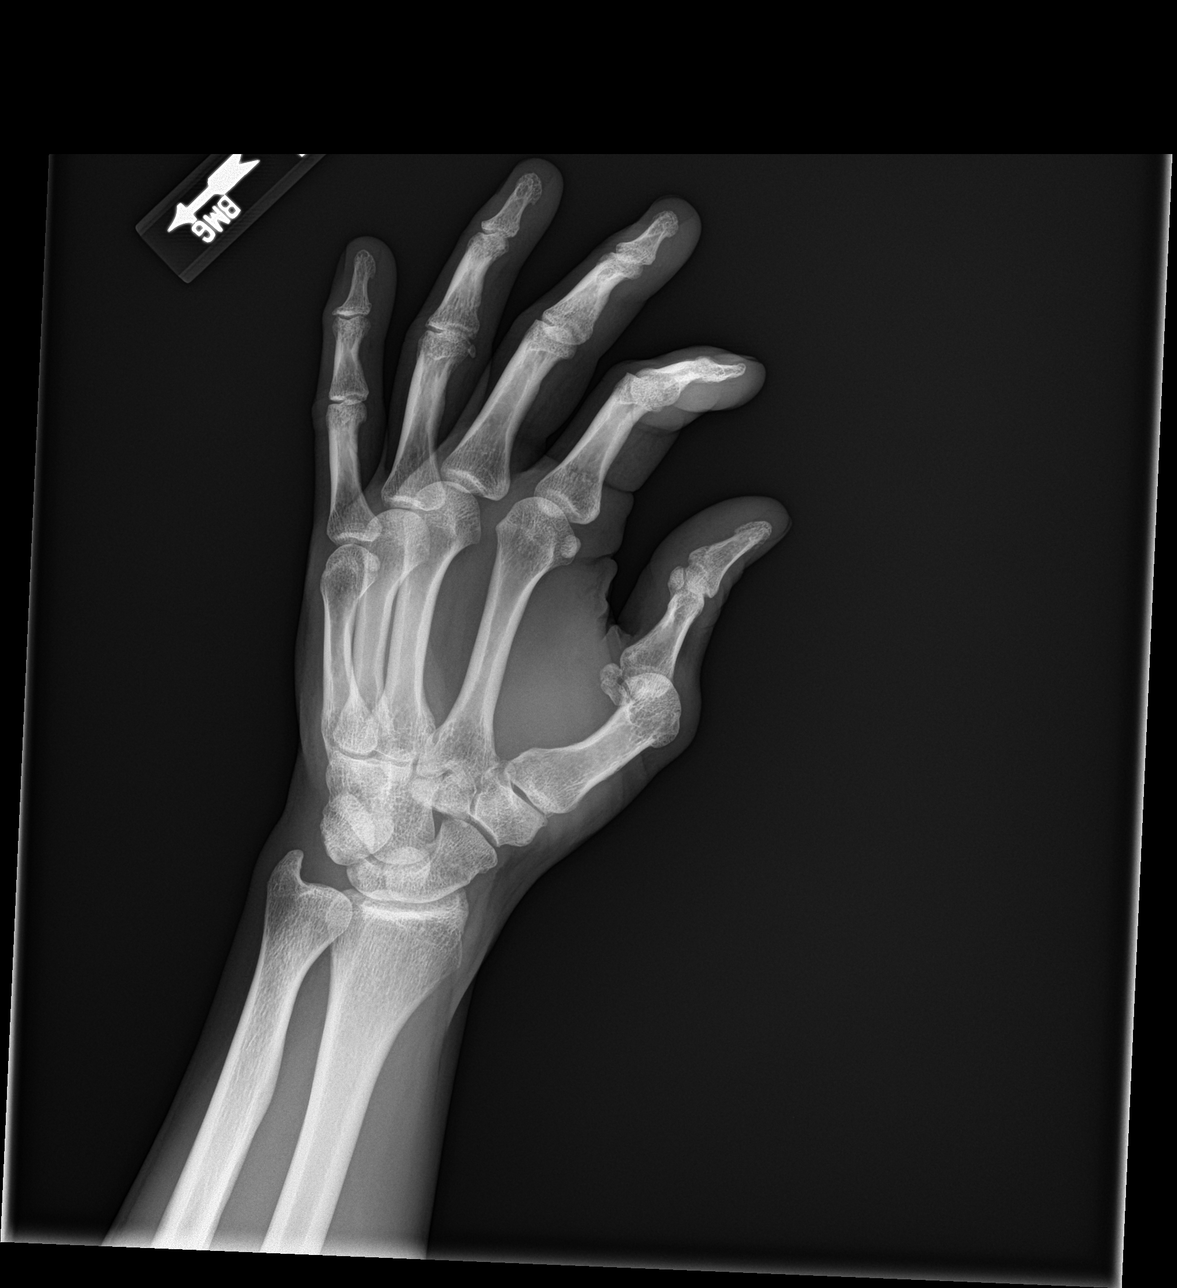

[hand lat]
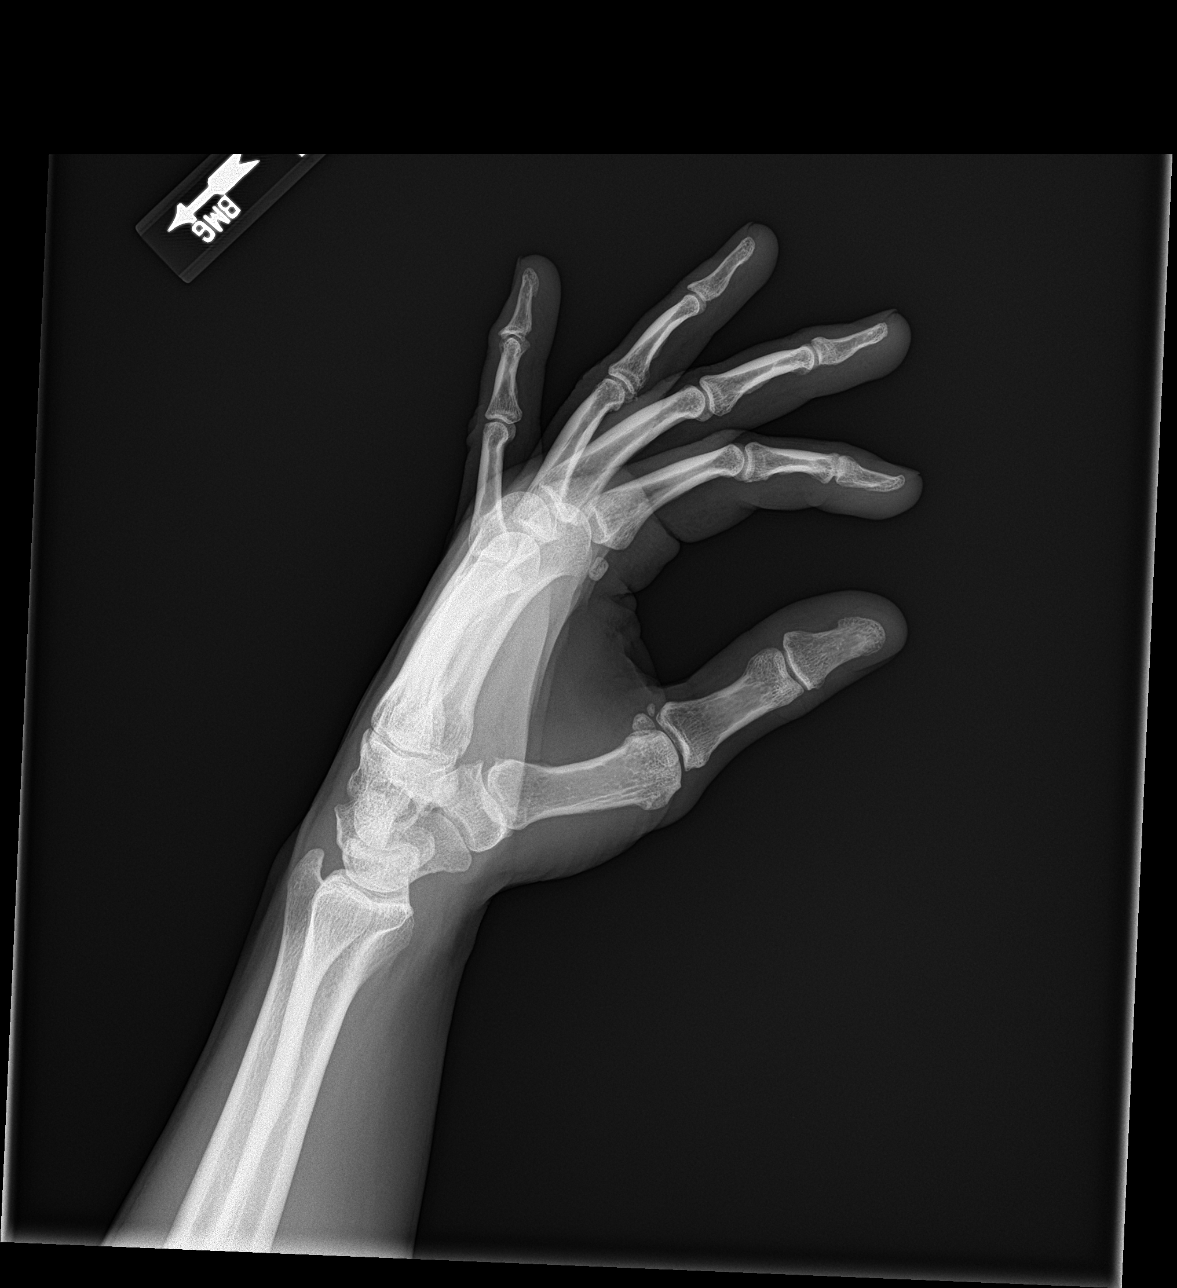

[3 of 3 positions shown; findings below may reference images not displayed]

FINDINGS: There is no evidence of fracture or dislocation. There is no
evidence of arthropathy or other focal bone abnormality. Soft
tissues are unremarkable.
IMPRESSION: Negative.

## 2022-02-26 ENCOUNTER — Other Ambulatory Visit: Payer: Self-pay | Admitting: Internal Medicine

## 2022-02-26 DIAGNOSIS — F3112 Bipolar disorder, current episode manic without psychotic features, moderate: Secondary | ICD-10-CM

## 2022-03-27 ENCOUNTER — Encounter: Payer: Self-pay | Admitting: Registered Nurse

## 2022-05-07 ENCOUNTER — Other Ambulatory Visit: Payer: Self-pay | Admitting: Internal Medicine

## 2022-05-07 ENCOUNTER — Telehealth: Payer: Self-pay | Admitting: Internal Medicine

## 2022-05-07 DIAGNOSIS — F3112 Bipolar disorder, current episode manic without psychotic features, moderate: Secondary | ICD-10-CM

## 2022-05-07 MED ORDER — DIVALPROEX SODIUM 250 MG PO DR TAB: 750.0000 mg | DELAYED_RELEASE_TABLET | Freq: Two times a day (BID) | ORAL | 0 refills | Status: DC

## 2022-05-07 NOTE — Telephone Encounter (Signed)
Caller & Relationship to patient:  self   Call back [phone]   Date of last office visit: 03/27/2022   Date of next office visit:   Medication(s) to be refilled: Depakote        Preferred Pharmacy:[facility] on [address]

## 2022-06-10 ENCOUNTER — Telehealth: Payer: Self-pay | Admitting: Internal Medicine

## 2022-06-10 ENCOUNTER — Other Ambulatory Visit: Payer: Self-pay | Admitting: Internal Medicine

## 2022-06-10 DIAGNOSIS — F3112 Bipolar disorder, current episode manic without psychotic features, moderate: Secondary | ICD-10-CM

## 2022-06-10 MED ORDER — DIVALPROEX SODIUM 250 MG PO DR TAB: 750.0000 mg | DELAYED_RELEASE_TABLET | Freq: Two times a day (BID) | ORAL | 0 refills | Status: DC

## 2022-06-10 NOTE — Telephone Encounter (Signed)
Caller & Relationship to patient:  patient   Call back number:  [phone]   Date of last office visit:01/2022   Date of next office visit:   - Patient is completely out of his medication Medication(s) to be refilled:  Depakote        Preferred Pharmacy:  [facility] on [address]

## 2022-07-09 ENCOUNTER — Telehealth: Payer: Self-pay | Admitting: Internal Medicine

## 2022-07-09 NOTE — Telephone Encounter (Signed)
Prescription Request  07/09/2022  LOV: 01/14/2022  What is the name of the medication or equipment? Depakote 250 mg  Have you contacted your pharmacy to request a refill? Yes   Which pharmacy would you like this sent to?  [facility], [address] [place] [address] Phone: [phone] Fax: [phone]    Patient notified that their request is being sent to the clinical staff for review and that they should receive a response within 2 business days.   Please advise at Mobile [phone] (mobile)

## 2022-07-10 ENCOUNTER — Other Ambulatory Visit: Payer: Self-pay | Admitting: Internal Medicine

## 2022-07-10 DIAGNOSIS — F3112 Bipolar disorder, current episode manic without psychotic features, moderate: Secondary | ICD-10-CM

## 2022-07-10 MED ORDER — DIVALPROEX SODIUM 250 MG PO DR TAB: 750.0000 mg | DELAYED_RELEASE_TABLET | Freq: Two times a day (BID) | ORAL | 0 refills | Status: DC

## 2022-07-10 NOTE — Telephone Encounter (Signed)
Patient is out of his medication.  

## 2022-08-05 ENCOUNTER — Other Ambulatory Visit: Payer: Self-pay | Admitting: Internal Medicine

## 2022-08-05 DIAGNOSIS — F3112 Bipolar disorder, current episode manic without psychotic features, moderate: Secondary | ICD-10-CM

## 2022-08-30 ENCOUNTER — Other Ambulatory Visit: Payer: Self-pay | Admitting: Internal Medicine

## 2022-08-30 DIAGNOSIS — F3112 Bipolar disorder, current episode manic without psychotic features, moderate: Secondary | ICD-10-CM

## 2022-10-01 ENCOUNTER — Other Ambulatory Visit: Payer: Self-pay | Admitting: Internal Medicine

## 2022-10-01 DIAGNOSIS — F3112 Bipolar disorder, current episode manic without psychotic features, moderate: Secondary | ICD-10-CM

## 2022-11-06 ENCOUNTER — Telehealth: Payer: Self-pay | Admitting: Internal Medicine

## 2022-11-06 NOTE — Telephone Encounter (Signed)
He needs to be seen

## 2022-11-06 NOTE — Telephone Encounter (Signed)
Attempted to call pt. VM is full and can not accept any additional messages at this time.  He will need to be seen prior to refills. Please schedule if he calls back.

## 2022-11-06 NOTE — Telephone Encounter (Signed)
Prescription Request  11/06/2022  LOV: 01/14/2022  What is the name of the medication or equipment? depakote  Have you contacted your pharmacy to request a refill? Yes   Which pharmacy would you like this sent to?  [facility] [address] [place], [name] - [address] [name] [address] Phone: [phone] Fax: [phone]    Patient notified that their request is being sent to the clinical staff for review and that they should receive a response within 2 business days.   Please advise at Mobile [phone] (mobile)

## 2022-11-07 ENCOUNTER — Other Ambulatory Visit: Payer: Self-pay | Admitting: Internal Medicine

## 2022-11-07 DIAGNOSIS — F3112 Bipolar disorder, current episode manic without psychotic features, moderate: Secondary | ICD-10-CM

## 2022-11-07 NOTE — Telephone Encounter (Signed)
Tried calling pt no answer and can't leave msg due to vm full. Will call back later.Marland KitchenRaechel Chute

## 2022-11-07 NOTE — Telephone Encounter (Signed)
Patient is completely out of his medication.  He made appointment for 11/14/2022 - Can you send in enough until then?

## 2022-11-07 NOTE — Telephone Encounter (Signed)
Called lot again he answered gave him MD response. Pt made appt for in the morning 10:00.Marland KitchenRaechel Chute

## 2022-11-07 NOTE — Telephone Encounter (Signed)
Please get him in tomorrow for an OV with labs

## 2022-11-08 ENCOUNTER — Ambulatory Visit (INDEPENDENT_AMBULATORY_CARE_PROVIDER_SITE_OTHER): Payer: Self-pay | Admitting: Internal Medicine

## 2022-11-08 ENCOUNTER — Encounter: Payer: Self-pay | Admitting: Internal Medicine

## 2022-11-08 VITALS — BP 110/74 | HR 60 | Temp 98.1°F | Ht 71.0 in | Wt 173.0 lb

## 2022-11-08 DIAGNOSIS — F3176 Bipolar disorder, in full remission, most recent episode depressed: Secondary | ICD-10-CM | POA: Insufficient documentation

## 2022-11-08 DIAGNOSIS — Z114 Encounter for screening for human immunodeficiency virus [HIV]: Secondary | ICD-10-CM

## 2022-11-08 DIAGNOSIS — Z5181 Encounter for therapeutic drug level monitoring: Secondary | ICD-10-CM | POA: Insufficient documentation

## 2022-11-08 DIAGNOSIS — F39 Unspecified mood [affective] disorder: Secondary | ICD-10-CM

## 2022-11-08 DIAGNOSIS — Z1159 Encounter for screening for other viral diseases: Secondary | ICD-10-CM | POA: Insufficient documentation

## 2022-11-08 DIAGNOSIS — F411 Generalized anxiety disorder: Secondary | ICD-10-CM

## 2022-11-08 LAB — HEPATIC FUNCTION PANEL
ALT: 13 U/L (ref 0–53)
AST: 17 U/L (ref 0–37)
Albumin: 4.8 g/dL (ref 3.5–5.2)
Alkaline Phosphatase: 49 U/L (ref 39–117)
Bilirubin, Direct: 0.1 mg/dL (ref 0.0–0.3)
Total Bilirubin: 0.5 mg/dL (ref 0.2–1.2)
Total Protein: 7.2 g/dL (ref 6.0–8.3)

## 2022-11-08 LAB — BASIC METABOLIC PANEL
BUN: 15 mg/dL (ref 6–23)
CO2: 27 mEq/L (ref 19–32)
Calcium: 9.9 mg/dL (ref 8.4–10.5)
Chloride: 102 mEq/L (ref 96–112)
Creatinine, Ser: 1.1 mg/dL (ref 0.40–1.50)
GFR: 83.33 mL/min (ref >60.00)
Glucose, Bld: 105 mg/dL — ABNORMAL HIGH (ref 70–99)
Potassium: 4.9 mEq/L (ref 3.5–5.1)
Sodium: 139 mEq/L (ref 135–145)

## 2022-11-08 LAB — CBC WITH DIFFERENTIAL/PLATELET
Basophils Absolute: 0 10*3/uL (ref 0.0–0.1)
Basophils Relative: 0.3 % (ref 0.0–3.0)
Eosinophils Absolute: 0 10*3/uL (ref 0.0–0.7)
Eosinophils Relative: 0.5 % (ref 0.0–5.0)
HCT: 46.2 % (ref 39.0–52.0)
Hemoglobin: 15.1 g/dL (ref 13.0–17.0)
Lymphocytes Relative: 32.2 % (ref 12.0–46.0)
Lymphs Abs: 1.6 10*3/uL (ref 0.7–4.0)
MCHC: 32.7 g/dL (ref 30.0–36.0)
MCV: 90.6 fl (ref 78.0–100.0)
Monocytes Absolute: 0.4 10*3/uL (ref 0.1–1.0)
Monocytes Relative: 8.3 % (ref 3.0–12.0)
Neutro Abs: 2.9 10*3/uL (ref 1.4–7.7)
Neutrophils Relative %: 58.7 % (ref 43.0–77.0)
Platelets: 245 10*3/uL (ref 150.0–400.0)
RBC: 5.1 Mil/uL (ref 4.22–5.81)
RDW: 14.1 % (ref 11.5–15.5)
WBC: 5 10*3/uL (ref 4.0–10.5)

## 2022-11-08 MED ORDER — TRAZODONE HCL 50 MG PO TABS
50.0000 mg | ORAL_TABLET | Freq: Every day | ORAL | 1 refills | Status: DC
Start: 2022-11-08 — End: 2023-05-15

## 2022-11-08 NOTE — Patient Instructions (Signed)
Bipolar I Disorder Bipolar I disorder is a mental health disorder in which a person has episodes of emotional highs (mania) and may also have episodes of lows (depression). Bipolar I disorder differs from other bipolar disorders in that it involves extreme episodes of mania (manic episodes). These episodes last at least one week or involve severe symptoms that require a hospital stay to keep the person safe. What are the causes? The cause of this condition is not known. What increases the risk? The following factors may make you more likely to develop this condition: Having a family member with the disorder. Having an imbalance of certain chemicals in the brain (neurotransmitters). Experiencing stress, such as from illness, financial problems, or a death. Having certain conditions that affect the brain or spinal cord (neurologic conditions). Having had a brain injury (trauma). What are the signs or symptoms? Symptoms of bipolar I disorder include the following: Symptoms of mania Very high self-esteem or self-confidence. Less need for sleep. Unusual talkativeness. Speech may be very fast. Racing thoughts with quick shifts between topics that may or may not be related (flight of ideas). Being much more able or much less able to concentrate. Increased purposeful activity, such as work, studies, or social activity. Increased agitation. This could be pacing, squirming, fidgeting, or finger and toe tapping. Impulsive behavior and poor judgment. These may lead to high-risk activities that are sexual, financial, or physical. Symptoms of depression Extreme sadness, uncontrollable crying, or feeling hopeless, worthless, or numb. Sleep problems, such as trouble falling asleep or staying asleep (insomnia), waking early, or sleeping too much. No longer enjoying things you used to enjoy. Isolation, or spending time alone often. Lack of energy or motivation, and moving more slowly than normal. Trouble  making decisions. Increased appetite or loss of appetite. Thoughts of death, or wanting to harm yourself. Sometimes, you may have a mixed mood. This means having symptoms of mania and depression at the same time. Stress can often trigger these symptoms. How is this diagnosed? This condition may be diagnosed based on a mental health evaluation that includes a review of: Emotional episodes. Medical history. Use of alcohol, drugs, and prescription medicines. Certain medical conditions and substances can cause secondary bipolar disorder. This has symptoms like the symptoms of bipolar disorder. Your health care provider may ask you to take a short test to help understand your symptoms. You may also be asked to follow up with a mental health provider for further evaluation or to start treatment. How is this treated?     This condition is a long-term (chronic) illness. It is often managed with ongoing treatment rather than treatment only when symptoms occur. A combination of treatments is often used. Treatment may include: Medicines, usually medicines called mood stabilizers. Medicines can be prescribed by a health care provider who specializes in treating mental health disorders (psychiatrist). If symptoms occur during use of a mood stabilizer, other medicines may be added. Talk therapy (psychotherapy) to help you manage bipolar disorder. Talk therapy includes cognitive behavioral therapy (CBT) and family therapy. Psychoeducation. This helps you and others understand how your disorder is managed. Include friends and family in educational sessions so they learn how best to support you. Methods of managing your condition, such as journaling or relaxation exercises. Exercises include: Yoga. Meditation. Deep breathing. Lifestyle changes, such as: Limiting alcohol and drug use. Exercising regularly. Setting a regular bedtime and wake time. Eating a healthy diet. Electroconvulsive therapy (ECT). This  is a procedure in which electricity is  applied to the brain through the scalp. ECT may be used for severe bipolar disorder when medicine and psychotherapy work too slowly or do not work. Follow these instructions at home: Activity Return to your normal activities as told by your health care provider. Find activities that you enjoy, and make time to do them. Get regular exercise. Lifestyle  Follow a set schedule for eating and sleeping. Eat a healthy diet that includes fresh fruits and vegetables, whole grains, low-fat dairy, and lean meat. Get at least 7-8 hours of sleep each night. Avoid using products that contain nicotine or tobacco. If you want help quitting, ask your health care provider. Avoid alcohol and drugs. They can affect how medicine works and make symptoms worse. General instructions Take over-the-counter and prescription medicines only as told by your health care provider. You may think about stopping your medicine, but you need to take all your medicine as prescribed. This helps manage your symptoms. Consider joining a support group. Your health care provider may be able to recommend one. Talk with your family and friends about your treatment goals and how loved ones can help. Keep all follow-up visits. This is important. Where to find more information [employer] on Mental Illness: nami.[employer] of Mental Health: BloggerCourse.com Contact a health care provider if: Your symptoms get worse, or your loved ones tell you that your symptoms are getting worse. You have uncomfortable side effects from your medicine. You have trouble sleeping. You have trouble doing daily activities. You feel unsafe in your surroundings. You are using alcohol or drugs to manage your symptoms. Get help right away if: You have new symptoms. You have thoughts about harming yourself or others. You are considering suicide. If you ever feel like you may hurt yourself or others, or  have thoughts about taking your own life, get help right away. Go to your nearest emergency department or: Call your local emergency services (911 in the [place] Call a suicide crisis helpline, such as the National Suicide Prevention Lifeline at (813)406-0693 or 988 in the [place] This is open 24 hours a day in the [place] Text the Crisis Text Line at 216-170-0305 (in the [place] Summary Bipolar I disorder is a lifelong mental health disorder in which a person has episodes of mania and depression. Treatment for this disorder involves a combination of treatments such as medicines and talk and behavioral therapies. Include friends and family in educational sessions so they know how best to support you. Get help right away if you are considering suicide. This information is not intended to replace advice given to you by your health care provider. Make sure you discuss any questions you have with your health care provider. Document Revised: 10/26/2020 Document Reviewed: 09/21/2020 Elsevier Patient Education  2024 [employer].

## 2022-11-08 NOTE — Progress Notes (Signed)
Subjective:  Patient ID: Ronald [name], male    DOB: 11-22-1980  Age: 42 y.o. MRN: [id number]  CC: Depression   HPI KITT KERKHOFF presents for f/up ----  Discussed the use of AI scribe software for clinical note transcription with the patient, who gave verbal consent to proceed.  History of Present Illness   The patient, employed at a fine [employer], reports a stable mood with no side effects from Depakote. He denies any illusions, delusions, hallucinations, or thoughts of self-harm. He does not smoke or drink, but does use tobacco pouches. He maintains an active lifestyle, working out three to four times a week and running six miles weekly, with no reported weight or appetite issues.  The patient has been out of Trazodone for two to three weeks, occasionally supplementing with Melatonin as needed. He continues to take Zyprexa in the evenings and maintain the same dose of Depakote. He denies any symptoms of rashes or night sweats.       Outpatient Medications Prior to Visit  Medication Sig Dispense Refill   melatonin 10 MG TABS Take 10 mg by mouth at bedtime. 30 tablet 0   OLANZapine (ZYPREXA) 15 MG tablet Take 1 tablet (15 mg total) by mouth at bedtime. 90 tablet 1   divalproex (DEPAKOTE) 250 MG DR tablet TAKE 3 TABLETS BY MOUTH TWICE DAILY 180 tablet 0   traZODone (DESYREL) 50 MG tablet Take 1 tablet (50 mg total) by mouth at bedtime. 30 tablet 0   No facility-administered medications prior to visit.    ROS Review of Systems  Constitutional:  Negative for chills, diaphoresis, fatigue and unexpected weight change.  HENT: Negative.    Eyes: Negative.   Respiratory: Negative.  Negative for cough, shortness of breath and wheezing.   Cardiovascular:  Negative for chest pain, palpitations and leg swelling.  Gastrointestinal: Negative.  Negative for abdominal pain, constipation, diarrhea and vomiting.  Endocrine: Negative.   Genitourinary: Negative.  Negative for  difficulty urinating.  Musculoskeletal: Negative.   Skin: Negative.  Negative for rash.  Neurological: Negative.  Negative for dizziness.  Hematological:  Negative for adenopathy. Does not bruise/bleed easily.  Psychiatric/Behavioral:  Positive for dysphoric mood and sleep disturbance. Negative for behavioral problems, confusion, decreased concentration and suicidal ideas. The patient is nervous/anxious.     Objective:  BP 110/74 (BP Location: Left Arm, Patient Position: Sitting, Cuff Size: Large)   Pulse 60   Temp 98.1 F (36.7 C) (Oral)   Ht 5\' 11"  (1.803 m)   Wt 173 lb (78.5 kg)   SpO2 96%   BMI 24.13 kg/m   BP Readings from Last 3 Encounters:  11/08/22 110/74  01/14/22 116/78  09/27/21 127/74    Wt Readings from Last 3 Encounters:  11/08/22 173 lb (78.5 kg)  01/14/22 194 lb (88 kg)  09/26/21 185 lb (83.9 kg)    Physical Exam Vitals reviewed.  Constitutional:      Appearance: Normal appearance.  HENT:     Mouth/Throat:     Mouth: Mucous membranes are moist.  Eyes:     General: No scleral icterus.    Conjunctiva/sclera: Conjunctivae normal.  Cardiovascular:     Rate and Rhythm: Normal rate and regular rhythm.     Heart sounds: No murmur heard.    No gallop.  Pulmonary:     Effort: Pulmonary effort is normal.     Breath sounds: No stridor. No wheezing, rhonchi or rales.  Abdominal:     Palpations:  There is no mass.     Tenderness: There is no abdominal tenderness. There is no guarding.     Hernia: No hernia is present.  Musculoskeletal:        General: Normal range of motion.     Cervical back: Neck supple.     Right lower leg: No edema.     Left lower leg: No edema.  Lymphadenopathy:     Cervical: No cervical adenopathy.  Skin:    General: Skin is warm and dry.  Neurological:     General: No focal deficit present.     Mental Status: He is alert. Mental status is at baseline.  Psychiatric:        Attention and Perception: Attention normal.         Mood and Affect: Mood normal.        Speech: Speech normal.        Behavior: Behavior normal.        Thought Content: Thought content normal.        Cognition and Memory: Cognition normal.        Judgment: Judgment normal.     Lab Results  Component Value Date   WBC 5.0 11/08/2022   HGB 15.1 11/08/2022   HCT 46.2 11/08/2022   PLT 245.0 11/08/2022   GLUCOSE 105 (H) 11/08/2022   CHOL 131 02/19/2020   TRIG 76 02/19/2020   HDL 61 02/19/2020   LDLCALC 55 02/19/2020   ALT 13 11/08/2022   AST 17 11/08/2022   NA 139 11/08/2022   K 4.9 11/08/2022   CL 102 11/08/2022   CREATININE 1.10 11/08/2022   BUN 15 11/08/2022   CO2 27 11/08/2022   TSH 1.884 09/29/2021   HGBA1C 4.9 09/29/2021    No results found.  Assessment & Plan:  Encounter for screening for HIV -     HIV Antibody (routine testing w rflx); Future  Need for hepatitis C screening test -     Hepatitis C antibody; Future  Therapeutic drug monitoring -     CBC with Differential/Platelet; Future -     Hepatic function panel; Future -     Basic metabolic panel; Future -     Valproic acid level; Future  GAD (generalized anxiety disorder) -     CBC with Differential/Platelet; Future -     Hepatic function panel; Future -     Basic metabolic panel; Future -     Valproic acid level; Future -     traZODone HCl; Take 1 tablet (50 mg total) by mouth at bedtime.  Dispense: 90 tablet; Refill: 1  Bipolar 1 disorder, depressed, full remission (HCC)- His valproic acid level is undetectable.  Will restart valproic acid. -     CBC with Differential/Platelet; Future -     Hepatic function panel; Future -     Basic metabolic panel; Future -     Valproic acid level; Future     Follow-up: Return in about 6 months (around 05/11/2023).  Sanda Linger, MD

## 2022-11-09 ENCOUNTER — Other Ambulatory Visit: Payer: Self-pay | Admitting: Internal Medicine

## 2022-11-09 DIAGNOSIS — F3112 Bipolar disorder, current episode manic without psychotic features, moderate: Secondary | ICD-10-CM

## 2022-11-14 ENCOUNTER — Ambulatory Visit: Payer: Self-pay | Admitting: Internal Medicine

## 2022-11-21 ENCOUNTER — Other Ambulatory Visit: Payer: Self-pay | Admitting: Internal Medicine

## 2022-11-21 DIAGNOSIS — F3112 Bipolar disorder, current episode manic without psychotic features, moderate: Secondary | ICD-10-CM

## 2022-12-02 ENCOUNTER — Other Ambulatory Visit: Payer: Self-pay | Admitting: Internal Medicine

## 2022-12-02 DIAGNOSIS — F3112 Bipolar disorder, current episode manic without psychotic features, moderate: Secondary | ICD-10-CM

## 2023-01-02 ENCOUNTER — Other Ambulatory Visit: Payer: Self-pay | Admitting: Internal Medicine

## 2023-01-02 DIAGNOSIS — F3112 Bipolar disorder, current episode manic without psychotic features, moderate: Secondary | ICD-10-CM

## 2023-01-29 ENCOUNTER — Other Ambulatory Visit: Payer: Self-pay | Admitting: Internal Medicine

## 2023-01-29 DIAGNOSIS — F200 Paranoid schizophrenia: Secondary | ICD-10-CM

## 2023-01-29 DIAGNOSIS — F3112 Bipolar disorder, current episode manic without psychotic features, moderate: Secondary | ICD-10-CM

## 2023-01-30 ENCOUNTER — Other Ambulatory Visit: Payer: Self-pay | Admitting: Internal Medicine

## 2023-01-30 DIAGNOSIS — F3112 Bipolar disorder, current episode manic without psychotic features, moderate: Secondary | ICD-10-CM

## 2023-02-24 ENCOUNTER — Other Ambulatory Visit: Payer: Self-pay | Admitting: Internal Medicine

## 2023-02-24 DIAGNOSIS — F3112 Bipolar disorder, current episode manic without psychotic features, moderate: Secondary | ICD-10-CM

## 2023-03-18 ENCOUNTER — Emergency Department (HOSPITAL_COMMUNITY): Payer: Self-pay

## 2023-03-18 ENCOUNTER — Other Ambulatory Visit: Payer: Self-pay

## 2023-03-18 ENCOUNTER — Emergency Department (HOSPITAL_COMMUNITY)
Admission: EM | Admit: 2023-03-18 | Discharge: 2023-03-18 | Disposition: A | Discharge status: Home / Self Care | Payer: Self-pay | Attending: Emergency Medicine | Admitting: Emergency Medicine

## 2023-03-18 DIAGNOSIS — M25511 Pain in right shoulder: Secondary | ICD-10-CM | POA: Insufficient documentation

## 2023-03-18 DIAGNOSIS — W19XXXA Unspecified fall, initial encounter: Secondary | ICD-10-CM

## 2023-03-18 DIAGNOSIS — W01198A Fall on same level from slipping, tripping and stumbling with subsequent striking against other object, initial encounter: Secondary | ICD-10-CM | POA: Insufficient documentation

## 2023-03-18 DIAGNOSIS — S0012XA Contusion of left eyelid and periocular area, initial encounter: Secondary | ICD-10-CM | POA: Insufficient documentation

## 2023-03-18 DIAGNOSIS — S0990XA Unspecified injury of head, initial encounter: Secondary | ICD-10-CM | POA: Insufficient documentation

## 2023-03-18 NOTE — Discharge Instructions (Addendum)
You were seen today for a fall that happened 2 weeks ago.  You should follow-up with your PCP or Ortho if shoulder pain persists longer than 2 weeks or having worsening weakness. If you are having continued brain fog, intermittent headaches, or are having blurry vision schedule an appointment and follow-up with neurology.   Take Ibuprofen 600mg  mg every 4-6 hours for pain or fever, not exceeding 3,200 mg per day as more than 3,200mg  can cause Stomach irritation, dizziness, kidney issues with long-term use.  If you are having worsening pain, worsening headache, worsening vision changes, chest pain, shortness of breath, balance problems, return to the ER for further evaluation

## 2023-03-18 NOTE — ED Triage Notes (Signed)
C/o fall 2 weeks ago with loc.  Pt reports hitting head. Pt denies headache but states "I feel fuzzy headed." Denies blood thinner usage.  Also c/o right shoulder pain that pops with movement.

## 2023-03-18 NOTE — ED Provider Notes (Signed)
[place] EMERGENCY DEPARTMENT AT [facility] Provider Note   CSN: [id number] Arrival date & time: 03/18/23  1339     History  Chief Complaint  Patient presents with   Shoulder Pain   Fall    Ronald [name] is a 42 y.o. male.  The history is provided by the patient.  Shoulder Pain Fall Associated symptoms include headaches (Intermittent).  Patient presents to the ED 2 weeks post fall after what he thinks is a vasovagal episode.  He was standing quickly to go use the bathroom and while urinating he fell to the ground from the standing position where he believes he lost consciousness for 1 to 2 seconds.  Hit head on the floor and right shoulder.  He does not believe this is related to his medication as he had not yet taken his medication.  He thinks that it could have been due to dehydration in the morning.  No repeat events of follow-up.  Is not on anticoagulants.  Denies drug use, alcohol. Since then he has experienced confusion, fogginess, intermittent headache, intermittent blurry vision in both eyes.  Previous Hx of traumatic subdural hematoma and multiple concussions.  Denies nausea, vomiting, neck pain, chest wall pain, abdominal pain, limb pain.     Home Medications Prior to Admission medications   Medication Sig Start Date End Date Taking? Authorizing Provider  divalproex (DEPAKOTE) 250 MG DR tablet TAKE 3 TABLETS BY MOUTH TWICE DAILY 02/24/23   Etta Grandchild, MD  melatonin 10 MG TABS Take 10 mg by mouth at bedtime. 10/01/21   Starleen Blue, NP  OLANZapine (ZYPREXA) 15 MG tablet TAKE 1 TABLET BY MOUTH AT BEDTIME 01/29/23   Etta Grandchild, MD  traZODone (DESYREL) 50 MG tablet Take 1 tablet (50 mg total) by mouth at bedtime. 11/08/22 05/07/23  Etta Grandchild, MD      Allergies    Hydroxyzine, Benadryl [diphenhydramine], Other, and Penicillins    Review of Systems   Review of Systems  Neurological:  Positive for headaches (Intermittent).       Intermittent  bouts of fogginess and confusion  All other systems reviewed and are negative.   Physical Exam Updated Vital Signs BP (!) 142/82 (BP Location: Left Arm)   Pulse 62   Temp 98.3 F (36.8 C) (Oral)   Resp 16   Ht 5\' 11"  (1.803 m)   Wt 79.4 kg   SpO2 100%   BMI 24.41 kg/m  Physical Exam Vitals and nursing note reviewed.  Constitutional:      Appearance: Normal appearance.  HENT:     Head: Normocephalic and atraumatic.  Eyes:     Extraocular Movements: Extraocular movements intact.     Conjunctiva/sclera: Conjunctivae normal.  Cardiovascular:     Rate and Rhythm: Normal rate and regular rhythm.     Pulses: Normal pulses.     Heart sounds: Normal heart sounds. No murmur heard.    No friction rub. No gallop.  Pulmonary:     Effort: Pulmonary effort is normal. No respiratory distress.     Breath sounds: Normal breath sounds.  Abdominal:     General: Abdomen is flat.     Palpations: Abdomen is soft.     Tenderness: There is no abdominal tenderness.  Musculoskeletal:        General: Tenderness present. No deformity.     Comments: Left shoulder tenderness noted over the bicipital groove  1 x 0.5 cm hematoma noted above the left eye.  Skin:    General: Skin is warm and dry.  Neurological:     General: No focal deficit present.     Mental Status: He is alert. Mental status is at baseline.  Psychiatric:        Mood and Affect: Mood normal.     ED Results / Procedures / Treatments   Labs (all labs ordered are listed, but only abnormal results are displayed) Labs Reviewed - No data to display  EKG None  Radiology CT Head Wo Contrast  Result Date: 03/18/2023 CLINICAL DATA:  Head trauma, intracranial arterial injury suspected. Fall 2 weeks ago with loss of consciousness. Headache. EXAM: CT HEAD WITHOUT CONTRAST TECHNIQUE: Contiguous axial images were obtained from the base of the skull through the vertex without intravenous contrast. RADIATION DOSE REDUCTION: This exam  was performed according to the departmental dose-optimization program which includes automated exposure control, adjustment of the mA and/or kV according to patient size and/or use of iterative reconstruction technique. COMPARISON:  Head CT 02/15/2020 FINDINGS: Brain: There is no evidence of an acute infarct, intracranial hemorrhage, mass, midline shift, or extra-axial fluid collection. The ventricles and sulci are normal. Vascular: No hyperdense vessel. Skull: No acute fracture or suspicious osseous lesion. Sinuses/Orbits: Visualized paranasal sinuses and mastoid air cells are clear. Included orbits are unremarkable. Other: None. IMPRESSION: Negative head CT. Electronically Signed   By: Sebastian Ache M.D.   On: 03/18/2023 17:59   DG Shoulder Right  Result Date: 03/18/2023 CLINICAL DATA:  Fall. EXAM: RIGHT SHOULDER - 2+ VIEW COMPARISON:  None Available. FINDINGS: No acute fracture or dislocation. No aggressive osseous lesion. Glenohumeral and acromioclavicular joints are normal in alignment and without significant arthritis. No soft tissue swelling. No radiopaque foreign bodies. IMPRESSION: *No acute osseous abnormality of the right shoulder. Electronically Signed   By: Jules Schick M.D.   On: 03/18/2023 15:40    Procedures Procedures    Medications Ordered in ED Medications - No data to display  ED Course/ Medical Decision Making/ A&P                                 Medical Decision Making Amount and/or Complexity of Data Reviewed Radiology: ordered.     Patient presents to the ED for concern of episodes of confusion, intermittent headaches, intermittent blurry vision post fall, this involves an extensive number of treatment options, and is a complaint that carries with it a high risk of complications and morbidity.  The differential diagnosis includes postconcussive syndrome, subdural hematoma, migraine headache   Co morbidities that complicate the patient evaluation  Bipolar  1 Multiple concussions Previous traumatic subdural   Additional history obtained:  Additional history obtained from  Family, Nursing, Outside Medical Records, and Past Admission    Lab Tests:  No labs are necessary at this time as the patient was no longer experiencing any symptoms outside of brain fog.   Imaging Studies ordered:  I ordered imaging studies including CT head without contrast and x-ray of shoulder I independently visualized and interpreted imaging which showed no acute abnormalities or pathology. I agree with the radiologist interpretation     Medicines ordered and prescription drug management:  No medications were necessary for this visit. I have reviewed the patients home medicines and have made adjustments as needed   Test Considered:  CBC, CMP, UA --unnecessary as patient was not experiencing any symptoms currently    Problem List /  ED Course:  Fall 2 weeks ago --patient presents to the ED today complaining of intermittent headache, follow-up, intermittent blurry vision, right shoulder pain post fall.  Patient states he fell and hit his head after a syncope episode which ended with a LOC of 1 to 2 seconds.  He believes this was due to dehydration.  Patient was not experiencing any symptoms currently outside of right shoulder pain.  On exam patient was able to move shoulder freely and did not have any neurological deficits, sensory deficits.  Denies palpitations, chest pain, shortness of breath, abdominal pain, limb pain.  He also denies alcohol, drug use.  Unlikely to be cardiac related as he is not having any similar syncope episodes before or since.  Due to previous history of traumatic subdural, CT head was ordered due to intermittent symptoms and patient's concern for possible recurrence.  X-ray was also ordered however he was experiencing a lot of pain and had previous history of dislocations of that shoulder.  Both CT and x-ray or negative for any acute  pathology.  Patient was provided strict return to ER precautions.  Patient was told to manage shoulder with ibuprofen and follow-up with PCP and/or Ortho if pain last longer than the next 2 weeks.  And to return for any more serious symptoms regarding another syncope episode.   Reevaluation:  After the interventions noted above, I reevaluated the patient and found that they have continued to get better    Dispostion:  After consideration of the diagnostic results and the patients response to treatment, I feel that the patent would benefit from discharge with treatment noted as above.    Final Clinical Impression(s) / ED Diagnoses Final diagnoses:  Fall, initial encounter    Rx / DC Orders ED Discharge Orders     None     Portions of this report may have been transcribed using voice recognition software. Every effort was made to ensure accuracy; however, inadvertent computerized transcription errors may be present.     Lunette Stands, [place] 03/18/23 1940    Linwood Dibbles, MD 03/19/23 1100

## 2023-04-05 ENCOUNTER — Other Ambulatory Visit: Payer: Self-pay | Admitting: Internal Medicine

## 2023-04-05 DIAGNOSIS — F3112 Bipolar disorder, current episode manic without psychotic features, moderate: Secondary | ICD-10-CM

## 2023-04-07 ENCOUNTER — Other Ambulatory Visit: Payer: Self-pay | Admitting: Internal Medicine

## 2023-04-07 DIAGNOSIS — F3112 Bipolar disorder, current episode manic without psychotic features, moderate: Secondary | ICD-10-CM

## 2023-04-11 ENCOUNTER — Telehealth: Payer: Self-pay | Admitting: Internal Medicine

## 2023-04-11 DIAGNOSIS — F3112 Bipolar disorder, current episode manic without psychotic features, moderate: Secondary | ICD-10-CM

## 2023-04-11 MED ORDER — DIVALPROEX SODIUM 250 MG PO DR TAB: 750.0000 mg | DELAYED_RELEASE_TABLET | Freq: Two times a day (BID) | ORAL | 0 refills | Status: DC

## 2023-04-11 NOTE — Telephone Encounter (Signed)
Patient called in yelling disrespectfully (and cursing) at phone agent, the front desk team, and was transferred to Clinical Leader.  During conversation patient stated his mom was recently diagnosed with cancer. Patient stated he has been without his medications for a few days and was very upset we keep telling him he needs an appointment. Patient informed that medication request came through on 12/21 and 12/23 and both were denied by provider as he needs a 3 month followup appointment. Patient state he is self pay and does not want to come in every 3 months he will do his own treatment for his anxiety.  Informed patient that is not best plan of care and I can offer him an appointment for 12/31 @11am  and see if Dr. Yetta Barre will refill his RX until then. He started fussing and cursing again, told patient this behavior will not get you results and let me get him the appointment. He then stated "I am calling my lawyer" and hung up.   Patient called back a few moments later and hung up before we could get him transferred.

## 2023-04-11 NOTE — Telephone Encounter (Signed)
Tried to call patient and make him aware provider refill medication, but Voicemail is full. He does need a follow up appointment scheduled for January/February 2025 if he calls back.

## 2023-04-11 NOTE — Telephone Encounter (Signed)
Medication has been refilled and the patient has been scheduled.

## 2023-04-11 NOTE — Telephone Encounter (Signed)
Pt has not been seen since 7.26.24 and no showed last appointment. Pt must be seen for an OV before RX can be refilled.

## 2023-04-11 NOTE — Telephone Encounter (Signed)
Patient has been made of medication refill and appointment has been scheduled.

## 2023-04-11 NOTE — Telephone Encounter (Signed)
error 

## 2023-04-11 NOTE — Telephone Encounter (Signed)
Copied from CRM [id number]. Topic: Appointments - Scheduling Inquiry for Clinic >> Apr 11, 2023 10:31 AM Lorin Glass B wrote: Reason for CRM: Patient was irate due to lack of rx divalproex (DEPAKOTE) being refilled. Stated that it is supposed to be in his bloodstream and he requested a refill a week ago but was told he needs an appt to refill. Patient enraged as he states he is now experiencing withdrawal symptoms due to not having rx and that he needs the appt and refill today. States that he will pursue legal action as we are denying him care. Spoke to CAL and patient has been calling office non stop demanding rx and has already spoken to clinical supervisor. Per CAL if patient calls back, warm transfer to them at [facility]. Patient hung up before finished speaking to CAL. --- Pt has spoken to several office staff members this morning, and was informed by our clinical supervisor that we can not refill his medication until he has an appointment.   Pt had an appointment set for 8.1.24 but no showed, and has no upcoming appointments TDW

## 2023-04-11 NOTE — Telephone Encounter (Signed)
Copied from CRM [id number]. Topic: Clinical - Medication Refill >> Apr 07, 2023 10:47 AM Adele Barthel wrote: Most Recent Primary Care Visit:  Provider: Etta Grandchild  Department: [facility] [place]  Visit Type: OFFICE VISIT  Date: 11/08/2022  Medication: divalproex (DEPAKOTE) 250 MG DR tablet [  Has the patient contacted their pharmacy? Yes (Agent: If no, request that the patient contact the pharmacy for the refill. If patient does not wish to contact the pharmacy document the reason why and proceed with request.) (Agent: If yes, when and what did the pharmacy advise?)  Is this the correct pharmacy for this prescription? Yes If no, delete pharmacy and type the correct one.  This is the patient's preferred pharmacy:  [facility] [address], [name] - [address] [name] [address] Phone: [phone] Fax: [phone]   Has the prescription been filled recently? Yes  Is the patient out of the medication? Yes  Has the patient been seen for an appointment in the last year OR does the patient have an upcoming appointment? Yes  Can we respond through MyChart? Yes  Agent: Please be advised that Rx refills may take up to 3 business days. We ask that you follow-up with your pharmacy.

## 2023-05-09 ENCOUNTER — Telehealth: Payer: Self-pay | Admitting: Internal Medicine

## 2023-05-09 ENCOUNTER — Other Ambulatory Visit: Payer: Self-pay | Admitting: Internal Medicine

## 2023-05-09 DIAGNOSIS — F3112 Bipolar disorder, current episode manic without psychotic features, moderate: Secondary | ICD-10-CM

## 2023-05-09 NOTE — Telephone Encounter (Signed)
Copied from CRM [id number]. Topic: Clinical - Medication Refill >> May 09, 2023  1:22 PM Truddie Crumble wrote: Most Recent Primary Care Visit:  Provider: Etta Grandchild  Department: [facility] [place]  Visit Type: OFFICE VISIT  Date: 11/08/2022  Medication: divalproex  Has the patient contacted their pharmacy? Yes (Agent: If no, request that the patient contact the pharmacy for the refill. If patient does not wish to contact the pharmacy document the reason why and proceed with request.) (Agent: If yes, when and what did the pharmacy advise?)  Is this the correct pharmacy for this prescription? Yes If no, delete pharmacy and type the correct one.  This is the patient's preferred pharmacy:  [facility] [address] [name] - [address] [name] [address] Phone: [phone] Fax: [phone]   Has the prescription been filled recently? No  Is the patient out of the medication? Yes  Has the patient been seen for an appointment in the last year OR does the patient have an upcoming appointment? Yes  Can we respond through MyChart? No  Agent: Please be advised that Rx refills may take up to 3 business days. We ask that you follow-up with your pharmacy.

## 2023-05-13 ENCOUNTER — Ambulatory Visit: Payer: Self-pay | Admitting: Internal Medicine

## 2023-05-13 ENCOUNTER — Other Ambulatory Visit: Payer: Self-pay | Admitting: Family

## 2023-05-13 DIAGNOSIS — F3112 Bipolar disorder, current episode manic without psychotic features, moderate: Secondary | ICD-10-CM

## 2023-05-13 MED ORDER — DIVALPROEX SODIUM 250 MG PO DR TAB: 750.0000 mg | DELAYED_RELEASE_TABLET | Freq: Two times a day (BID) | ORAL | 0 refills | Status: DC

## 2023-05-13 NOTE — Telephone Encounter (Signed)
Chief Complaint: Anxiety Symptoms: Overwhelmed  Frequency: comes and goes  Disposition: [] ED /[] Urgent Care (no appt availability in office) / [] Appointment(In office/virtual)/ []  [facility]/ [] Home Care/ [] Refused Recommended Disposition /[] [facility]/ [x]  Follow-up with PCP Additional Notes: Patient's mom calling in stating that the patient has lost his Divalproex Rx that was filled 05/09/23 and he is not doing well without it. She states she has cancer and he is her caregiver. She is the only person he has in the world and this can be overwhelming for him. She states he does not want to leave the house right now. Denies any thoughts of self harm or harm to others. Care advice was given and advised if she is concerned about patient's safety call 911. She stated he just needs his medication. She also stated that she gave him one of her anti-anxiety pills to help him. She is requesting a short-term emergency refill for the Divalproex Rx be sent to the pharmacy for the patient. Copied from CRM [id number]. Topic: Clinical - Red Word Triage >> May 13, 2023  4:41 PM Fuller Mandril wrote: Red Word that prompted transfer to Nurse Triage: Patient mom called with concerns about his anxiety and being overwhelmed from being her care taker and worried what is going to happed. States anxiety level 200 on scale of 1-10 with all he has going on and also he lost his last prescription for Depakote and is not doing Reason for Disposition  [1] Symptoms of anxiety or panic attack AND [2] is a chronic symptom (recurrent or ongoing AND present > 4 weeks)  Answer Assessment - Initial Assessment Questions 1. CONCERN: "Did anything happen that prompted you to call today?"      I am concerned about my son's well being  2. ANXIETY SYMPTOMS: "Can you describe how you (your loved one; patient) have been feeling?" (e.g., tense, restless, panicky, anxious, keyed up, overwhelmed, sense of impending doom).       Overwhelming 3. ONSET: "How long have you been feeling this way?" (e.g., hours, days, weeks)     About 3 days  4. SEVERITY: "How would you rate the level of anxiety?" (e.g., 0 - 10; or mild, moderate, severe).     Moderate to severe  5. FUNCTIONAL IMPAIRMENT: "How have these feelings affected your ability to do daily activities?" "Have you had more difficulty than usual doing your normal daily activities?" (e.g., getting better, same, worse; self-care, school, work, interactions)     He hasn't been leaving the house because he is anxious  6. HISTORY: "Have you felt this way before?" "Have you ever been diagnosed with an anxiety problem in the past?" (e.g., generalized anxiety disorder, panic attacks, PTSD). If Yes, ask: "How was this problem treated?" (e.g., medicines, counseling, etc.)     Dapakote  7. RISK OF HARM - SUICIDAL IDEATION: "Do you ever have thoughts of hurting or killing yourself?" If Yes, ask:  "Do you have these feelings now?" "Do you have a plan on how you would do this?"     No  8. TREATMENT:  "What has been done so far to treat this anxiety?" (e.g., medicines, relaxation strategies). "What has helped?"     Dapakote  9. TREATMENT - THERAPIST: "Do you have a counselor or therapist? Name?"     No 10. POTENTIAL TRIGGERS: "Do you drink caffeinated beverages (e.g., coffee, colas, teas), and how much daily?" "Do you drink alcohol or use any drugs?" "Have you started any  new medicines recently?"       Energy drinks  4-6 a day  11. PATIENT SUPPORT: "Who is with you now?" "Who do you live with?" "Do you have family or friends who you can talk to?"        Me, his mom 44. OTHER SYMPTOMS: "Do you have any other symptoms?" (e.g., feeling depressed, trouble concentrating, trouble sleeping, trouble breathing, palpitations or fast heartbeat, chest pain, sweating, nausea, or diarrhea)       Depressed  Protocols used: Anxiety and Panic Attack-A-AH

## 2023-05-13 NOTE — Telephone Encounter (Signed)
Copied from CRM [id number]. Topic: Clinical - Prescription Issue >> May 13, 2023 12:23 PM Ronald [name] wrote: Reason for CRM: Patient states he needs another refill for his divalproex (DEPAKOTE) 250 MG DR tabletcalled into the [employer] on file, patient states he lost the new bottle of medication that was sent to pharmacy on 05/09/2023.  Ronald [name] [phone]

## 2023-05-13 NOTE — Telephone Encounter (Signed)
Please advise

## 2023-05-14 NOTE — Telephone Encounter (Signed)
Patient has been made aware.

## 2023-05-14 NOTE — Telephone Encounter (Signed)
Medication was send in for patient.

## 2023-05-15 ENCOUNTER — Emergency Department (HOSPITAL_COMMUNITY)
Admission: EM | Admit: 2023-05-15 | Discharge: 2023-05-15 | Disposition: A | Discharge status: Home / Self Care | Payer: Self-pay | Attending: Student | Admitting: Student

## 2023-05-15 ENCOUNTER — Encounter (HOSPITAL_COMMUNITY): Payer: Self-pay

## 2023-05-15 ENCOUNTER — Other Ambulatory Visit: Payer: Self-pay

## 2023-05-15 DIAGNOSIS — F411 Generalized anxiety disorder: Secondary | ICD-10-CM

## 2023-05-15 DIAGNOSIS — F3112 Bipolar disorder, current episode manic without psychotic features, moderate: Secondary | ICD-10-CM

## 2023-05-15 DIAGNOSIS — F419 Anxiety disorder, unspecified: Secondary | ICD-10-CM | POA: Insufficient documentation

## 2023-05-15 LAB — CBC WITH DIFFERENTIAL/PLATELET
Abs Immature Granulocytes: 0.02 10*3/uL (ref 0.00–0.07)
Basophils Absolute: 0 10*3/uL (ref 0.0–0.1)
Basophils Relative: 0 %
Eosinophils Absolute: 0 10*3/uL (ref 0.0–0.5)
Eosinophils Relative: 0 %
HCT: 41.3 % (ref 39.0–52.0)
Hemoglobin: 14.6 g/dL (ref 13.0–17.0)
Immature Granulocytes: 0 %
Lymphocytes Relative: 8 %
Lymphs Abs: 0.7 10*3/uL (ref 0.7–4.0)
MCH: 31 pg (ref 26.0–34.0)
MCHC: 35.4 g/dL (ref 30.0–36.0)
MCV: 87.7 fL (ref 80.0–100.0)
Monocytes Absolute: 0.6 10*3/uL (ref 0.1–1.0)
Monocytes Relative: 7 %
Neutro Abs: 7.7 10*3/uL (ref 1.7–7.7)
Neutrophils Relative %: 85 %
Platelets: 292 10*3/uL (ref 150–400)
RBC: 4.71 MIL/uL (ref 4.22–5.81)
RDW: 12.3 % (ref 11.5–15.5)
WBC: 9 10*3/uL (ref 4.0–10.5)
nRBC: 0 % (ref 0.0–0.2)

## 2023-05-15 LAB — COMPREHENSIVE METABOLIC PANEL
ALT: 20 U/L (ref 0–44)
AST: 24 U/L (ref 15–41)
Albumin: 4.5 g/dL (ref 3.5–5.0)
Alkaline Phosphatase: 41 U/L (ref 38–126)
Anion gap: 9 (ref 5–15)
BUN: 12 mg/dL (ref 6–20)
CO2: 22 mmol/L (ref 22–32)
Calcium: 9.1 mg/dL (ref 8.9–10.3)
Chloride: 104 mmol/L (ref 98–111)
Creatinine, Ser: 1.04 mg/dL (ref 0.61–1.24)
GFR, Estimated: >60 mL/min (ref >60)
Glucose, Bld: 177 mg/dL — ABNORMAL HIGH (ref 70–99)
Potassium: 3.8 mmol/L (ref 3.5–5.1)
Sodium: 135 mmol/L (ref 135–145)
Total Bilirubin: 0.7 mg/dL (ref 0.0–1.2)
Total Protein: 7.2 g/dL (ref 6.5–8.1)

## 2023-05-15 LAB — RAPID URINE DRUG SCREEN, HOSP PERFORMED
Amphetamines: NOT DETECTED
Barbiturates: NOT DETECTED
Benzodiazepines: NOT DETECTED
Cocaine: NOT DETECTED
Opiates: NOT DETECTED
Tetrahydrocannabinol: POSITIVE — AB

## 2023-05-15 LAB — ACETAMINOPHEN LEVEL: Acetaminophen (Tylenol), Serum: <10 ug/mL — ABNORMAL LOW (ref 10–30)

## 2023-05-15 LAB — ETHANOL: Alcohol, Ethyl (B): <10 mg/dL (ref <10)

## 2023-05-15 LAB — SALICYLATE LEVEL: Salicylate Lvl: <7 mg/dL — ABNORMAL LOW (ref 7.0–30.0)

## 2023-05-15 MED ORDER — LORAZEPAM 1 MG PO TABS: 1.0000 mg | ORAL_TABLET | Freq: Three times a day (TID) | ORAL | 0 refills | Status: DC | PRN

## 2023-05-15 MED ORDER — DIVALPROEX SODIUM 500 MG PO DR TAB: 750.0000 mg | DELAYED_RELEASE_TABLET | Freq: Two times a day (BID) | ORAL | Status: DC

## 2023-05-15 MED ORDER — DIVALPROEX SODIUM 500 MG PO DR TAB: 750.0000 mg | DELAYED_RELEASE_TABLET | ORAL | Status: DC

## 2023-05-15 MED ORDER — DIVALPROEX SODIUM 250 MG PO DR TAB: 750.0000 mg | DELAYED_RELEASE_TABLET | Freq: Two times a day (BID) | ORAL | 0 refills | Status: DC

## 2023-05-15 MED ORDER — MELATONIN 10 MG PO TABS: 10.0000 mg | ORAL_TABLET | Freq: Every day | ORAL | Status: DC

## 2023-05-15 MED ORDER — LORAZEPAM 1 MG PO TABS
1.0000 mg | ORAL_TABLET | Freq: Once | ORAL | Status: AC
  Administered 2023-05-15: 1 mg via ORAL

## 2023-05-15 MED ORDER — TRAZODONE HCL 50 MG PO TABS: 50.0000 mg | ORAL_TABLET | Freq: Every day | ORAL | 1 refills | Status: DC

## 2023-05-15 MED ORDER — LORAZEPAM 0.5 MG PO TABS
0.5000 mg | ORAL_TABLET | Freq: Once | ORAL | Status: DC
  Filled 2023-05-15: qty 1

## 2023-05-15 MED ORDER — MENS MULTIVITAMIN PO TABS: 1.0000 | ORAL_TABLET | Freq: Every day | ORAL | 3 refills | Status: DC

## 2023-05-15 NOTE — ED Notes (Signed)
Belongings bag returned to pt

## 2023-05-15 NOTE — ED Provider Triage Note (Addendum)
Emergency Medicine Provider Triage Evaluation Note  Ronald [name] , a 43 y.o. male  was evaluated in triage.  Pt complains of feeling dizzy today.  Denies any room spinning or double vision.  States he has been off of his Depakote for 4 days because he ran out.  He was able to get this medication refilled and took a dose today.  Denies any chest pain or shortness of breath. Denies SI or HI  Review of Systems  Positive: As above Negative: As above  Physical Exam  BP (!) 140/88   Pulse (!) 105   Temp 99.4 F (37.4 C) (Oral)   Resp 16   SpO2 98%  Gen:   Awake, no distress   Resp:  Normal effort  MSK:   Moves extremities without difficulty    Medical Decision Making  Medically screening exam initiated at 3:43 PM.  Appropriate orders placed.  Ronald [name] was informed that the remainder of the evaluation will be completed by another provider, this initial triage assessment does not replace that evaluation, and the importance of remaining in the ED until their evaluation is complete.     Arabella Merles, PA-C 05/15/23 1544    Arabella Merles, PA-C 05/15/23 1549

## 2023-05-15 NOTE — ED Notes (Signed)
Patient busted out the door saying he needs to call his mom. Marchelle Folks PA notified.

## 2023-05-15 NOTE — ED Triage Notes (Signed)
Pt reporting that he is off his medications. Pt requesting to talk with a doctor. Denying any SI or HI. Pt states that his mom has cancer and is very sad.

## 2023-05-16 NOTE — ED Provider Notes (Signed)
[place] EMERGENCY DEPARTMENT AT [facility] Provider Note  CSN: [id number] Arrival date & time: 05/15/23 1519  Chief Complaint(s) Psychiatric Evaluation (/)  HPI Ronald [name] is a 43 y.o. male with PMH anxiety, PTSD, bipolar 1, substance abuse who presents emergency room for evaluation of anxiety.  He has had multiple stressors in his life including the death of multiple family members and  his mother was recently diagnosed with cancer.  Patient arrives with his mother who states that patient is experiencing severe caretaker fatigue and had an event where he picked up his mother and dropped her on the table.  Patient is anxious but calm and not displaying evidence of psychosis here in the emergency department.  He states he feels very overwhelmed and has misplaced his psychiatric medications.  He has not taken his Depakote in multiple days.  He denies suicidal or homicidal ideation.  Denies auditory or visual hallucinations.   Past Medical History Past Medical History:  Diagnosis Date   Anxiety    Arthritis    Disorder of pineal gland    Post traumatic stress disorder    Patient Active Problem List   Diagnosis Date Noted   Encounter for screening for HIV 11/08/2022   Need for hepatitis C screening test 11/08/2022   Therapeutic drug monitoring 11/08/2022   Bipolar 1 disorder, depressed, full remission (HCC) 11/08/2022   PTSD (post-traumatic stress disorder) 09/27/2021   Severe bipolar I disorder, recurrent manic episode, with psychotic behavior (HCC) 08/13/2021   Bipolar 1 disorder with moderate mania (HCC) 06/03/2021   Substance induced mood disorder (HCC) 02/19/2020   Mild tetrahydrocannabinol (THC) abuse 11/23/2018   Schizophrenia, paranoid type (HCC) 12/23/2016   GAD (generalized anxiety disorder) 12/15/2016   Bipolar I disorder, single manic episode, severe with psychotic features (HCC) 09/29/2015   Disorder of pineal gland    Home Medication(s) Prior to  Admission medications   Medication Sig Start Date End Date Taking? Authorizing Provider  ascorbic acid (VITAMIN C) 500 MG tablet Take 500-1,000 mg by mouth daily.   Yes [provider]  Cholecalciferol (VITAMIN D3) 1000 units CAPS Take 1,000-2,000 Units by mouth daily.   Yes [provider]  Collagen-Vitamin C-Biotin (COLLAGEN PO) Take 1 capsule by mouth daily.   Yes [provider]  LORazepam (ATIVAN) 1 MG tablet Take 1 tablet (1 mg total) by mouth 3 (three) times daily as needed for anxiety. 05/15/23  Yes Nikkia Devoss, MD  divalproex (DEPAKOTE) 250 MG DR tablet Take 3 tablets (750 mg total) by mouth 2 (two) times daily. 05/15/23   Yvaine Jankowiak, MD  Melatonin 10 MG TABS Take 10 mg by mouth at bedtime. 05/15/23   Hoy Fallert, MD  Multiple Vitamins-Minerals (MENS MULTIVITAMIN) TABS Take 1 tablet by mouth daily with breakfast. 05/15/23   Yeraldine Forney, MD  OLANZapine (ZYPREXA) 15 MG tablet TAKE 1 TABLET BY MOUTH AT BEDTIME Patient not taking: Reported on 05/15/2023 01/29/23   Etta Grandchild, MD  traZODone (DESYREL) 50 MG tablet Take 1 tablet (50 mg total) by mouth at bedtime. 05/15/23 11/11/23  Laquiesha Piacente, Wyn Forster, MD  Past Surgical History Past Surgical History:  Procedure Laterality Date   ANKLE SURGERY     Family History History reviewed. No pertinent family history.  Social History Social History   Tobacco Use   Smoking status: Never   Smokeless tobacco: Current    Types: Chew  Vaping Use   Vaping status: Never Used  Substance Use Topics   Alcohol use: No   Drug use: Yes    Types: Marijuana    Comment: one year ago   Allergies Hydroxyzine, Alupent [metaproterenol], Benadryl [diphenhydramine], Klonopin [clonazepam], Olanzapine, and Other  Review of Systems Review of Systems  Psychiatric/Behavioral:  The patient is  nervous/anxious.     Physical Exam Vital Signs  I have reviewed the triage vital signs BP (!) 140/88   Pulse (!) 105   Temp 99.4 F (37.4 C) (Oral)   Resp 16   SpO2 98%   Physical Exam Constitutional:      General: He is not in acute distress.    Appearance: Normal appearance.  HENT:     Head: Normocephalic and atraumatic.     Nose: No congestion or rhinorrhea.  Eyes:     General:        Right eye: No discharge.        Left eye: No discharge.     Extraocular Movements: Extraocular movements intact.     Pupils: Pupils are equal, round, and reactive to light.  Cardiovascular:     Rate and Rhythm: Normal rate and regular rhythm.     Heart sounds: No murmur heard. Pulmonary:     Effort: No respiratory distress.     Breath sounds: No wheezing or rales.  Abdominal:     General: There is no distension.     Tenderness: There is no abdominal tenderness.  Musculoskeletal:        General: Normal range of motion.     Cervical back: Normal range of motion.  Skin:    General: Skin is warm and dry.  Neurological:     General: No focal deficit present.     Mental Status: He is alert.  Psychiatric:     Comments: Anxious.     ED Results and Treatments Labs (all labs ordered are listed, but only abnormal results are displayed) Labs Reviewed  COMPREHENSIVE METABOLIC PANEL - Abnormal; Notable for the following components:      Result Value   Glucose, Bld 177 (*)    All other components within normal limits  SALICYLATE LEVEL - Abnormal; Notable for the following components:   Salicylate Lvl <7.0 (*)    All other components within normal limits  ACETAMINOPHEN LEVEL - Abnormal; Notable for the following components:   Acetaminophen (Tylenol), Serum <10 (*)    All other components within normal limits  RAPID URINE DRUG SCREEN, HOSP PERFORMED - Abnormal; Notable for the following components:   Tetrahydrocannabinol POSITIVE (*)    All other components within normal limits  CBC  WITH DIFFERENTIAL/PLATELET  ETHANOL  Radiology No results found.  Pertinent labs & imaging results that were available during my care of the patient were reviewed by me and considered in my medical decision making (see MDM for details).  Medications Ordered in ED Medications  LORazepam (ATIVAN) tablet 1 mg (1 mg Oral Given 05/15/23 1719)                                                                                                                                     Procedures Procedures  (including critical care time)  Medical Decision Making / ED Course   This patient presents to the ED for concern of anxiety, this involves an extensive number of treatment options, and is a complaint that carries with it a high risk of complications and morbidity.  The differential diagnosis includes anxiety, depression, psychosis, grief, electrolyte abnormality, metabolic encephalopathy, polysubstance use, polysubstance withdrawal, UTI  MDM: Patient seen emergency room for evaluation of anxiety.  Physical exam is unremarkable.  Laboratory evaluation is unremarkable.  UDS positive for THC but is otherwise unremarkable.  Patient given 1 mg p.o. Ativan and on reevaluation his symptoms have significantly improved.  The patient and his mother very much would like the patient to be admitted to a psychiatric facility, but after an extended discussion I explained to both the patient and family that as he is currently not displaying any evidence of psychosis, is not suicidal or homicidal he does not meet criteria for emergent inpatient psychiatric consultation or psychiatric placement.  The patient is clearly experiencing caretaker fatigue and I asked the patient's mother to help bolster his support system at home.  I refilled all of his psychiatric medications and gave him a very short  prescription for Ativan for as needed use.  He was given return precautions of which he and his mother both voiced understanding and patient discharged with outpatient follow-up.  I did give the patient [facility] follow-up resources if needed.   Additional history obtained: -Additional history obtained from mother -External records from outside source obtained and reviewed including: Chart review including previous notes, labs, imaging, consultation notes   Lab Tests: -I ordered, reviewed, and interpreted labs.   The pertinent results include:   Labs Reviewed  COMPREHENSIVE METABOLIC PANEL - Abnormal; Notable for the following components:      Result Value   Glucose, Bld 177 (*)    All other components within normal limits  SALICYLATE LEVEL - Abnormal; Notable for the following components:   Salicylate Lvl <7.0 (*)    All other components within normal limits  ACETAMINOPHEN LEVEL - Abnormal; Notable for the following components:   Acetaminophen (Tylenol), Serum <10 (*)    All other components within normal limits  RAPID URINE DRUG SCREEN, HOSP PERFORMED - Abnormal; Notable for the following components:   Tetrahydrocannabinol POSITIVE (*)    All other components within normal limits  CBC WITH DIFFERENTIAL/PLATELET  ETHANOL  Medicines ordered and prescription drug management: Meds ordered this encounter  Medications   DISCONTD: divalproex (DEPAKOTE) DR tablet 750 mg   DISCONTD: LORazepam (ATIVAN) tablet 0.5 mg   LORazepam (ATIVAN) tablet 1 mg   divalproex (DEPAKOTE) 250 MG DR tablet    Sig: Take 3 tablets (750 mg total) by mouth 2 (two) times daily.    Dispense:  180 tablet    Refill:  0   Melatonin 10 MG TABS    Sig: Take 10 mg by mouth at bedtime.   traZODone (DESYREL) 50 MG tablet    Sig: Take 1 tablet (50 mg total) by mouth at bedtime.    Dispense:  90 tablet    Refill:  1   Multiple Vitamins-Minerals (MENS MULTIVITAMIN) TABS    Sig: Take 1 tablet by mouth daily with  breakfast.    Dispense:  30 tablet    Refill:  3   LORazepam (ATIVAN) 1 MG tablet    Sig: Take 1 tablet (1 mg total) by mouth 3 (three) times daily as needed for anxiety.    Dispense:  10 tablet    Refill:  0   DISCONTD: divalproex (DEPAKOTE) DR tablet 750 mg   DISCONTD: divalproex (DEPAKOTE) DR tablet 750 mg    -I have reviewed the patients home medicines and have made adjustments as needed  Critical interventions none   Social Determinants of Health:  Factors impacting patients care include: Patient experiencing significant caretaker fatigue, discussed possible options   Reevaluation: After the interventions noted above, I reevaluated the patient and found that they have :improved  Co morbidities that complicate the patient evaluation  Past Medical History:  Diagnosis Date   Anxiety    Arthritis    Disorder of pineal gland    Post traumatic stress disorder       Dispostion: I considered admission for this patient, but at this time he does not meet inpatient criteria for admission and will be discharged outpatient follow-up     Final Clinical Impression(s) / ED Diagnoses Final diagnoses:  Anxiety     @PCDICTATION @    Glendora Score, MD 05/16/23 1140

## 2023-05-17 ENCOUNTER — Ambulatory Visit (HOSPITAL_COMMUNITY)
Admission: EM | Admit: 2023-05-17 | Discharge: 2023-05-18 | Disposition: A | Discharge status: Other Acute Inpatient Hospital | Payer: No Payment, Other | Attending: Psychiatry | Admitting: Psychiatry

## 2023-05-17 ENCOUNTER — Other Ambulatory Visit: Payer: Self-pay

## 2023-05-17 DIAGNOSIS — F29 Unspecified psychosis not due to a substance or known physiological condition: Secondary | ICD-10-CM

## 2023-05-17 DIAGNOSIS — F319 Bipolar disorder, unspecified: Secondary | ICD-10-CM | POA: Diagnosis not present

## 2023-05-17 DIAGNOSIS — F419 Anxiety disorder, unspecified: Secondary | ICD-10-CM | POA: Insufficient documentation

## 2023-05-17 DIAGNOSIS — F209 Schizophrenia, unspecified: Secondary | ICD-10-CM | POA: Diagnosis not present

## 2023-05-17 DIAGNOSIS — F250 Schizoaffective disorder, bipolar type: Secondary | ICD-10-CM | POA: Insufficient documentation

## 2023-05-17 DIAGNOSIS — F259 Schizoaffective disorder, unspecified: Secondary | ICD-10-CM | POA: Insufficient documentation

## 2023-05-17 LAB — COMPREHENSIVE METABOLIC PANEL
ALT: 24 U/L (ref 0–44)
AST: 26 U/L (ref 15–41)
Albumin: 5 g/dL (ref 3.5–5.0)
Alkaline Phosphatase: 52 U/L (ref 38–126)
Anion gap: 11 (ref 5–15)
BUN: 10 mg/dL (ref 6–20)
CO2: 28 mmol/L (ref 22–32)
Calcium: 10.2 mg/dL (ref 8.9–10.3)
Chloride: 97 mmol/L — ABNORMAL LOW (ref 98–111)
Creatinine, Ser: 1.08 mg/dL (ref 0.61–1.24)
GFR, Estimated: >60 mL/min (ref >60)
Glucose, Bld: 106 mg/dL — ABNORMAL HIGH (ref 70–99)
Potassium: 4.2 mmol/L (ref 3.5–5.1)
Sodium: 136 mmol/L (ref 135–145)
Total Bilirubin: 0.9 mg/dL (ref 0.0–1.2)
Total Protein: 8.3 g/dL — ABNORMAL HIGH (ref 6.5–8.1)

## 2023-05-17 LAB — POCT URINE DRUG SCREEN - MANUAL ENTRY (I-SCREEN)
POC Amphetamine UR: NOT DETECTED
POC Buprenorphine (BUP): NOT DETECTED
POC Cocaine UR: NOT DETECTED
POC Marijuana UR: POSITIVE — AB
POC Methadone UR: NOT DETECTED
POC Methamphetamine UR: NOT DETECTED
POC Morphine: NOT DETECTED
POC Oxazepam (BZO): NOT DETECTED
POC Oxycodone UR: NOT DETECTED
POC Secobarbital (BAR): NOT DETECTED

## 2023-05-17 LAB — AMMONIA: Ammonia: 64 umol/L — ABNORMAL HIGH (ref 9–35)

## 2023-05-17 LAB — POC SARS CORONAVIRUS 2 AG: SARSCOV2ONAVIRUS 2 AG: NEGATIVE

## 2023-05-17 LAB — CBC WITH DIFFERENTIAL/PLATELET
Abs Immature Granulocytes: 0.06 10*3/uL (ref 0.00–0.07)
Basophils Absolute: 0 10*3/uL (ref 0.0–0.1)
Basophils Relative: 0 %
Eosinophils Absolute: 0 10*3/uL (ref 0.0–0.5)
Eosinophils Relative: 0 %
HCT: 49.7 % (ref 39.0–52.0)
Hemoglobin: 17.4 g/dL — ABNORMAL HIGH (ref 13.0–17.0)
Immature Granulocytes: 0 %
Lymphocytes Relative: 3 %
Lymphs Abs: 0.4 10*3/uL — ABNORMAL LOW (ref 0.7–4.0)
MCH: 30.9 pg (ref 26.0–34.0)
MCHC: 35 g/dL (ref 30.0–36.0)
MCV: 88.1 fL (ref 80.0–100.0)
Monocytes Absolute: 0.7 10*3/uL (ref 0.1–1.0)
Monocytes Relative: 5 %
Neutro Abs: 12.9 10*3/uL — ABNORMAL HIGH (ref 1.7–7.7)
Neutrophils Relative %: 92 %
Platelets: 288 10*3/uL (ref 150–400)
RBC: 5.64 MIL/uL (ref 4.22–5.81)
RDW: 12.6 % (ref 11.5–15.5)
WBC: 14.1 10*3/uL — ABNORMAL HIGH (ref 4.0–10.5)
nRBC: 0 % (ref 0.0–0.2)

## 2023-05-17 LAB — SARS CORONAVIRUS 2 BY RT PCR: SARS Coronavirus 2 by RT PCR: NEGATIVE

## 2023-05-17 LAB — LIPID PANEL
Cholesterol: 152 mg/dL (ref 0–200)
HDL: 78 mg/dL (ref >40)
LDL Cholesterol: 60 mg/dL (ref 0–99)
Total CHOL/HDL Ratio: 1.9 {ratio}
Triglycerides: 70 mg/dL (ref <150)
VLDL: 14 mg/dL (ref 0–40)

## 2023-05-17 LAB — HEMOGLOBIN A1C
Hgb A1c MFr Bld: 4.9 % (ref 4.8–5.6)
Mean Plasma Glucose: 93.93 mg/dL

## 2023-05-17 LAB — VALPROIC ACID LEVEL: Valproic Acid Lvl: 146 ug/mL — ABNORMAL HIGH (ref 50.0–100.0)

## 2023-05-17 LAB — TSH: TSH: 2.224 u[IU]/mL (ref 0.350–4.500)

## 2023-05-17 LAB — MAGNESIUM: Magnesium: 2.4 mg/dL (ref 1.7–2.4)

## 2023-05-17 LAB — ETHANOL: Alcohol, Ethyl (B): <10 mg/dL (ref <10)

## 2023-05-17 MED ORDER — OLANZAPINE 10 MG PO TABS
10.0000 mg | ORAL_TABLET | Freq: Every day | ORAL | Status: DC
  Administered 2023-05-17: 10 mg via ORAL
  Filled 2023-05-17: qty 1

## 2023-05-17 MED ORDER — ALUM & MAG HYDROXIDE-SIMETH 200-200-20 MG/5ML PO SUSP: 30.0000 mL | ORAL | Status: DC | PRN

## 2023-05-17 MED ORDER — LORAZEPAM 2 MG/ML IJ SOLN
2.0000 mg | Freq: Three times a day (TID) | INTRAMUSCULAR | Status: DC | PRN
  Filled 2023-05-17: qty 1

## 2023-05-17 MED ORDER — HALOPERIDOL LACTATE 5 MG/ML IJ SOLN
10.0000 mg | Freq: Three times a day (TID) | INTRAMUSCULAR | Status: DC | PRN
  Administered 2023-05-17: 10 mg via INTRAMUSCULAR
  Filled 2023-05-17: qty 2

## 2023-05-17 MED ORDER — OLANZAPINE 5 MG PO TABS
5.0000 mg | ORAL_TABLET | Freq: Every day | ORAL | Status: DC
  Administered 2023-05-17 – 2023-05-18 (×2): 5 mg via ORAL
  Filled 2023-05-17 (×2): qty 1

## 2023-05-17 MED ORDER — HALOPERIDOL 5 MG PO TABS: 5.0000 mg | ORAL_TABLET | Freq: Three times a day (TID) | ORAL | Status: DC | PRN

## 2023-05-17 MED ORDER — HALOPERIDOL LACTATE 5 MG/ML IJ SOLN: 5.0000 mg | Freq: Three times a day (TID) | INTRAMUSCULAR | Status: DC | PRN

## 2023-05-17 MED ORDER — NICOTINE 14 MG/24HR TD PT24
14.0000 mg | MEDICATED_PATCH | Freq: Every day | TRANSDERMAL | Status: DC
  Administered 2023-05-17 – 2023-05-18 (×2): 14 mg via TRANSDERMAL
  Filled 2023-05-17 (×2): qty 1

## 2023-05-17 MED ORDER — TRAZODONE HCL 50 MG PO TABS
50.0000 mg | ORAL_TABLET | Freq: Every evening | ORAL | Status: DC | PRN
  Administered 2023-05-17: 50 mg via ORAL
  Filled 2023-05-17: qty 1

## 2023-05-17 MED ORDER — LORAZEPAM 2 MG/ML IJ SOLN
2.0000 mg | Freq: Three times a day (TID) | INTRAMUSCULAR | Status: DC | PRN
  Administered 2023-05-17: 2 mg via INTRAMUSCULAR

## 2023-05-17 MED ORDER — ACETAMINOPHEN 325 MG PO TABS: 650.0000 mg | ORAL_TABLET | Freq: Four times a day (QID) | ORAL | Status: DC | PRN

## 2023-05-17 MED ORDER — MAGNESIUM HYDROXIDE 400 MG/5ML PO SUSP: 30.0000 mL | Freq: Every day | ORAL | Status: DC | PRN

## 2023-05-17 NOTE — Progress Notes (Signed)
Patient has been denied by [facility] due to no appropriate beds available. Patient meets BH inpatient criteria per Vernard Gambles, NP. Patient has been faxed out to the following facilities:   [facility] [address] [place] [name] [address] [phone]  [facility] [address] [name] [address] [phone]  [facility] Patient Placement [facility], [name] [phone]  [facility] [address] [name] [address] [phone]  [facility] [address] [name] [address] [phone]  [facility] [address], [name] [address] [phone]  [facility] EFAX [address] [name] [phone]  [facility] [address] [name] [address] [phone]  [facility] [address] [place], [name] [address] [phone]  [facility] [address] [name] [address] [phone]  [facility] [address] [name] [address] [phone]  [facility] [address] [name] [address] [phone]  [facility] [address] [name] [address] [phone]  [facility] [address] [place], [name] [address] [phone]  [facility] [address], [name] [address] [phone]   Damita Dunnings, MSW, LCSW-A  12:48 PM 05/17/2023

## 2023-05-17 NOTE — ED Notes (Signed)
Patient provided with meal and remains med compliant and cooperative in unit. Patient noted to have elevated temperature of 100.6 with patient denying any symptoms. Provider notified.

## 2023-05-17 NOTE — ED Notes (Signed)
Patient alert and oriented. Denies SI, HI, AVH, and pain. Scheduled medications administered to patient, per MD orders. Support and encouragement provided.  Routine safety checks conducted every hour.  Patient informed to notify staff with problems or concerns. No adverse drug reactions noted. Patient contracts for safety at this time. Patient compliant with medications and treatment plan. Patient receptive, calm, and cooperative. Patient interacts well with others on the unit.  Patient remains safe at this time.

## 2023-05-17 NOTE — Progress Notes (Signed)
Patient admitted into obs unit. Patient presented highly anxious in the assessment area and banging on the door with his R elbow. Slight redness noted with no open wounds or injury. Patient given agitation medication which provided some relief and patient appears much calmer. Patient cooperative with admission process and provided with a snack. Patient states the medication is helping him feel much better and stated he has been overwhelmed since he has had plenty of death in his family and his mother got diagnosed with cancer. Patient tearful and provided with emotional support. Patient ate his snack and remains cooperative at this time. No s/s of current distress.

## 2023-05-17 NOTE — Progress Notes (Signed)
   05/17/23 0846  [facility] Triage Screening (Walk-ins at [facility] only)  How Did You Hear About [place]? Legal System  What Is the Reason for Your Visit/Call Today? Ronald [name] presents to [facility] voluntarily via [place]. Pt states that he has PTSD and needs to get his medication regulated. Pt states that he was off the medication for a week and he tried to regulate them himself. Pt states that he would like to be committed to get his medications right. Pt states that he hasn't eaten today. Pt denies SI, HI, AVH and alcohol/drug use at this time.  How Long Has This Been Causing You Problems? 1 wk - 1 month  Have You Recently Had Any Thoughts About Hurting Yourself? No  Are You Planning to Commit Suicide/Harm Yourself At This time? No  Have you Recently Had Thoughts About Hurting Someone Ronald [name]? No  Are You Planning To Harm Someone At This Time? No  Physical Abuse Denies  Verbal Abuse Denies  Sexual Abuse Denies  Exploitation of patient/patient's resources Denies  Self-Neglect Denies  Are you currently experiencing any auditory, visual or other hallucinations? No  Have You Used Any Alcohol or Drugs in the Past 24 Hours? No  Do you have any current medical co-morbidities that require immediate attention? No  Clinician description of patient physical appearance/behavior: no shoes, calm, cooperative  What Do You Feel Would Help You the Most Today? Social Support;Medication(s)  If access to [facility] Urgent Care was not available, would you have sought care in the Emergency Department? No  Determination of Need Routine (7 days)  Options For Referral Medication Management;Inpatient Hospitalization;Outpatient Therapy

## 2023-05-17 NOTE — ED Notes (Signed)
Patient is boisterous and attacking objects in the assessment room. PRN medication administered for patient's and staff safety.

## 2023-05-17 NOTE — BH Assessment (Signed)
Comprehensive Clinical Assessment (CCA) Note  05/17/2023 Ronald [name] [id number]  Per Vernard Gambles, NP, Patient meets inpatient criteria   The patient demonstrates the following risk factors for suicide: Chronic risk factors for suicide include: psychiatric disorder of Bipolar and PTSD . Acute risk factors for suicide include:  brother was murdered and they were close . Protective factors for this patient include: positive social support. Considering these factors, the overall suicide risk at this point appears to be low. Patient is appropriate for outpatient follow up.  Patient is a 43 year old male who presents voluntarily to [facility] via [facility] Dept Patient states that he has PTSD and needs to get his medication regulated. Patient states that he went off the medication for a week and he tried to regulate his symptoms himself. Pt denies SI, HI, AVH and alcohol/drug use.   Collateral was obtained from patient's mother [name] [phone]). Mother reports that patient was first hospitalized in 2012 after his brother was murdered in 2011.  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  Per mother, patient has had at least 2 hospitalizations at [facility] in [place]. His last In-patient hospital stay was at [facility].  Mother reports that whenever the patient gets like this he seems to go into a fight or flight mode.  He has begun talking to himself in response to hearing voices.  He reports they are faint whispers.  Accoreding to mother, the patient appears to be paranoid, as he is fixated with the TV remote and refuses to leave home.  He also will refuse to open the door to the neighbors who come to check on them.    Patient has been prescribed Depakote, Olanzapine and Trazadone.  He denies that he is currently under psychiatric care.  Mother reports that he became upset with her, picked her up and  threw her away.  She says that if it had not been for her kitchen [name] she would have fallen.  The patient's mother is currently receiving treatment for a cancer diagnosis.  She says this has been stressful for the patient as he now cooks, cleans, and does things he has never had to do for himself.   [facility] response team was called out to help calm client down as his anxiety appeared to be increasing and mom was unable to get him to leave the house. BHRT took him to [facility] which sent him to [facility].   MSE: patient is casually dressed, alert, and oriented x4. Speech is rapid.   Patient's mood is anxious and depressed, and congruent affect.  The patient's thought process is coherent and relevant. There is no indication that the patient is currently responding to internal stimuli or experiencing delusional thought content.      Chief Complaint:  Chief Complaint  Patient presents with   Medication Problem   Visit Diagnosis: Post-Traumatic Stress Disorder                               CCA Screening, Triage and Referral (STR)  Patient Reported Information How did you hear about [place]? Legal System  What Is the Reason for Your Visit/Call Today? Ronald [name] presents to [facility] voluntarily via [place]. Pt states that he has PTSD and needs to get his medication regulated. Pt states that he was off the medication for a week  and he tried to regulate them himself. Pt states that he would like to be committed to get his medications right. Pt states that he hasn't eaten today. Pt denies SI, HI, AVH and alcohol/drug use at this time.  How Long Has This Been Causing You Problems? 1 wk - 1 month  What Do You Feel Would Help You the Most Today? Social Support; Medication(s)   Have You Recently Had Any Thoughts About Hurting Yourself? No  Are You Planning to Commit Suicide/Harm Yourself At This time? No   [name] from 05/17/2023 in [facility] ED from 05/15/2023  in [facility] Emergency Department at [facility] ED from 03/18/2023 in [facility] Emergency Department at [facility]  C-SSRS RISK CATEGORY No Risk No Risk No Risk       Have you Recently Had Thoughts About Hurting Someone [name]? No  Are You Planning to Harm Someone at This Time? No  Explanation: N/A   Have You Used Any Alcohol or Drugs in the Past 24 Hours? No  How Long Ago Did You Use Drugs or Alcohol?  N/A What Did You Use and How Much? N/A Do You Currently Have a Therapist/Psychiatrist? No  Name of Therapist/Psychiatrist: Does not have psychiatry services at this time  Have You Been Recently Discharged From Any Office Practice or Programs? No  Explanation of Discharge From Practice/Program: N/A    CCA Screening Triage Referral Assessment Type of Contact: Face-to-Face  Telemedicine Service Delivery:   Is this Initial or Reassessment?   Date Telepsych consult ordered in [facility]:    Time Telepsych consult ordered in [facility]:    Location of Assessment: [facility] Assessment Services  Provider Location: [facility] Assessment Services   Collateral Involvement: Mother [name] [phone] provided colateral information   Does Patient Have a Court Appointed Legal Guardian? No  Legal Guardian Contact Information: Patient is an adult  Copy of Legal Guardianship Form: -- (N/A)  Legal Guardian Notified of Arrival: -- (N/A)  Legal Guardian Notified of Pending Discharge: -- (N/A)  If Minor and Not Living with Parent(s), Who has Custody? N/A  Is CPS involved or ever been involved? Never  Is APS involved or ever been involved? Never   Patient Determined To Be At Risk for Harm To Self or Others Based on Review of Patient Reported Information or Presenting Complaint? No  Method: No Plan  Availability of Means: No access or NA  Intent: Vague intent or NA  Notification Required:N/A Additional Information for Danger to Others Potential: Active  psychosis  Additional Comments for Danger to Others Potential: Patient is kicking doors wanting to go home.  Per Mother, patient picked her up the other night because he was upset about the TV remote  Are There Guns or Other Weapons in Your Home? No  Types of Guns/Weapons: Denies  Are These Weapons Safely Secured?                            -- (N/A)  Who Could Verify You Are Able To Have These Secured: N/A  Do You Have any Outstanding Charges, Pending Court Dates, Parole/Probation? Denies  Contacted To Inform of Risk of Harm To Self or Others: Law Enforcement    Does Patient Present under Involuntary Commitment? No    [place] of Residence: [place]   Patient Currently Receiving the Following Services: Individual Therapy   Determination of Need: Urgent (48 hours)  Options For Referral: Inpatient Hospitalization; Outpatient Therapy; Medication Management     CCA Biopsychosocial Patient Reported Schizophrenia/Schizoaffective Diagnosis in Past: -- (not verified)   Strengths: good family support   Mental Health Symptoms Depression:  Difficulty Concentrating; Irritability; Sleep (too much or little); Change in energy/activity   Duration of Depressive symptoms: Duration of Depressive Symptoms: Less than two weeks   Mania:  Change in energy/activity; Increased Energy; Irritability   Anxiety:   Difficulty concentrating; Irritability; Restlessness; Sleep; Tension   Psychosis:  Hallucinations   Duration of Psychotic symptoms: Duration of Psychotic Symptoms: Less than six months   Trauma:  Avoids reminders of event; Difficulty staying/falling asleep; Irritability/anger   Obsessions:  Cause anxiety   Compulsions:  None   Inattention:  None   Hyperactivity/Impulsivity:  Talks excessively   Oppositional/Defiant Behaviors:  None   Emotional Irregularity:  Mood lability; Transient, stress-related paranoia/disassociation   Other Mood/Personality Symptoms:   Depressed/irritable    Mental Status Exam Appearance and self-care  Stature:  Average   Weight:  Average weight   Clothing:  -- (Pt dressed in scrubs.)   Grooming:  Normal   Cosmetic use:  None   Posture/gait:  Normal   Motor activity:  Restless; Agitated   Sensorium  Attention:  Persistent   Concentration:  Focuses on irrelevancies   Orientation:  X5   Recall/memory:  Normal   Affect and Mood  Affect:  Anxious   Mood:  Anxious; Depressed   Relating  Eye contact:  Normal   Facial expression:  Anxious   Attitude toward examiner:  Resistant   Thought and Language  Speech flow: Pressured   Thought content:  Suspicious   Preoccupation:  Ruminations (anxiety, loss, trauma)   Hallucinations:  Auditory (Patient endorses hearing voices that are muffled)   Organization:  Coherent   [employer] of Knowledge:  Good   Intelligence:  Above Average   Abstraction:  Normal   Judgement:  Impaired   Reality Testing:  Variable   Insight:  Flashes of insight   Decision Making:  Impulsive   Social Functioning  Social Maturity:  Impulsive   Social Judgement:  Heedless   Stress  Stressors:  Grief/losses; Work   Coping Ability:  Human resources officer Deficits:  Self-control; Responsibility; Decision making   Supports:  Family     Religion: Religion/Spirituality Are You A Religious Person?: No How Might This Affect Treatment?: N/A  Leisure/Recreation: Leisure / Recreation Do You Have Hobbies?: Yes Leisure and Hobbies: Painting, art, reading, working out, outdoor activities  Exercise/Diet: Exercise/Diet Do You Exercise?: Yes What Type of Exercise Do You Do?: Run/Walk, Weight Training How Many Times a Week Do You Exercise?: 4-5 times a week Have You Gained or Lost A Significant Amount of Weight in the Past Six Months?: No Do You Follow a Special Diet?: No Do You Have Any Trouble Sleeping?: Yes (pt reports he gets 8 consecutive hours  of sleep per night; also reports that he use CBD to help him sleep) Explanation of Sleeping Difficulties: Per mother pt has ot been sleeping and gets up all through the night waking her up   CCA Employment/Education Employment/Work Situation: Employment / Work Situation Employment Situation: Unemployed Patient's Job has Been Impacted by Current Illness: No Has Patient ever Been in Equities trader?: No  Education: Education Is Patient Currently Attending School?: No Last Grade Completed: 14 Did You Attend College?: Yes What Type of College Degree Do you Have?: 'Per  patient's motherr, patient  was in [employer], for a while, had to discontinue program after brother was murdered Did You Have An Individualized Education Program (IIEP): No Did You Have Any Difficulty At [employer]?: No Patient's Education Has Been Impacted by Current Illness: No   CCA Family/Childhood History Family and Relationship History: Family history Does patient have children?: No  Childhood History:  Childhood History By whom was/is the patient raised?: Mother, Father Did patient suffer any verbal/emotional/physical/sexual abuse as a child?: No Did patient suffer from severe childhood neglect?: No Has patient ever been sexually abused/assaulted/raped as an adolescent or adult?: No Was the patient ever a victim of a crime or a disaster?: No Witnessed domestic violence?: No Has patient been affected by domestic violence as an adult?: No       CCA Substance Use Alcohol/Drug Use: Alcohol / Drug Use Pain Medications: See MAR Prescriptions: See MAR Over the Counter: See MAR History of alcohol / drug use?: Yes Longest period of sobriety (when/how long): Unknown Negative Consequences of Use:  (UTA) Withdrawal Symptoms: Agitation, Aggressive/Assaultive (Denies)                         ASAM's:  Six Dimensions of Multidimensional Assessment  Dimension 1:  Acute Intoxication and/or Withdrawal  Potential:   Dimension 1:  Description of individual's past and current experiences of substance use and withdrawal: social ETOH, cannabis, and regular delta 8 use  Dimension 2:  Biomedical Conditions and Complications:   Dimension 2:  Description of patient's biomedical conditions and  complications: Pt did not reports biomedical conditions  Dimension 3:  Emotional, Behavioral, or Cognitive Conditions and Complications:  Dimension 3:  Description of emotional, behavioral, or cognitive conditions and complications: PTSD, Anxiety  Dimension 4:  Readiness to Change:  Dimension 4:  Description of Readiness to Change criteria: Contemplation  Dimension 5:  Relapse, Continued use, or Continued Problem Potential:  Dimension 5:  Relapse, continued use, or continued problem potential critiera description: continued use  Dimension 6:  Recovery/Living Environment:  Dimension 6:  Recovery/Iiving environment criteria description: Pt reports that he live with his mother in a safe place  ASAM Severity Score: ASAM's Severity Rating Score: 9  ASAM Recommended Level of Treatment: ASAM Recommended Level of Treatment: Level II Intensive Outpatient Treatment   Substance use Disorder (SUD) Substance Use Disorder (SUD)  Checklist Symptoms of Substance Use: Continued use despite having a persistent/recurrent physical/psychological problem caused/exacerbated by use, Persistent desire or unsuccessful efforts to cut down or control use, Recurrent use that results in a failure to fulfill major role obligations (work, school, home), Evidence of withdrawal (Comment)  Recommendations for Services/Supports/Treatments: Recommendations for Services/Supports/Treatments Recommendations For Services/Supports/Treatments: Medication Management, Individual Therapy, Other (Comment) (overnight observation)  Disposition Recommendation per psychiatric provider: We recommend inpatient psychiatric hospitalization when medically cleared.  Patient is under voluntary admission status at this time; please IVC if attempts to leave hospital.   DSM5 Diagnoses: Patient Active Problem List   Diagnosis Date Noted   Encounter for screening for HIV 11/08/2022   Need for hepatitis C screening test 11/08/2022   Therapeutic drug monitoring 11/08/2022   Bipolar 1 disorder, depressed, full remission (HCC) 11/08/2022   PTSD (post-traumatic stress disorder) 09/27/2021   Severe bipolar I disorder, recurrent manic episode, with psychotic behavior (HCC) 08/13/2021   Bipolar 1 disorder with moderate mania (HCC) 06/03/2021   Substance induced mood disorder (HCC) 02/19/2020   Mild tetrahydrocannabinol (THC) abuse 11/23/2018   Schizophrenia, paranoid type (HCC) 12/23/2016  GAD (generalized anxiety disorder) 12/15/2016   Bipolar I disorder, single manic episode, severe with psychotic features (HCC) 09/29/2015   Disorder of pineal gland      Referrals to Alternative Service(s): Referred to Alternative Service(s):   Place:   Date:   Time:    Referred to Alternative Service(s):   Place:   Date:   Time:    Referred to Alternative Service(s):   Place:   Date:   Time:    Referred to Alternative Service(s):   Place:   Date:   Time:     Donnamae Jude, LCSW

## 2023-05-17 NOTE — ED Provider Notes (Cosign Needed Addendum)
[facility] Continuous Assessment Admission H&P  Date: 05/17/23 Patient Name: Ronald [name] MRN: [id number] Chief Complaint: "I need my meds and want to go home"  Diagnoses:  Final diagnoses:  Psychosis, unspecified psychosis type [facility])    HPI: patient presented to [facility] as a walk in  accompanied by Law Enforcement with complaints of ""I need my meds and want to go home"  [name], 43 y.o., male patient seen face to face by this provider, consulted with Dr. Enedina Finner; and chart reviewed on 05/17/23.  Per chart review patient has a past psychiatric history of bipolar 1 disorder, cannabis use disorder, anxiety, polysubstance abuse and SI. He has a history of psychiatric admissions.  With his last admission being at [facility] 09/2021.  He has been prescribed Depakote, olanzapine, melatonin, gabapentin, trazodone, and lorazepam in the past.  Notified by staff that while patient was awaiting assessment room he became boisterous with kicking and attacking objects in the assessment room.  During evaluation [name] is observed sitting in the assessment area.  He is casually dressed and makes fleeting eye contact.  He is irritable.  Initially his speech is pressured and loud.  His thoughts are scattered.  He is extremely anxious and guarded.  He states, "I just need my meds, I want to go home, I refuse treatment".  He appears agitated and paranoid.  He refused to answer any questions at that time.  He did give permission to call his mother. After talking talking with patient's mother and obtaining collateral information.  Went back to the assessment room and discussed findings with patient.  He admits that he has been hearing voices and these voices tell him that someone is trying to kill him.  He is delusional and he does believe he has had aliens in his house.  He appears very afraid.  He is a poor historian. He denies SI/HI.  He denies visual hallucinations.  He does not appear to be  responding to internal/external stimuli.  Call contact patient's mother [name] [phone]) cell [phone].  Patient mother reports that he was doing well in life and was enrolled in law school.  He had completed roughly 1.5 years and in 2011 his brother was murdered.  States this was a difficult time for him and who began to lose touch with reality and came paranoid.  He was hospitalized for the first time in 2012.  Since that time he has been hospitalized multiple times including 2 admissions at [facility].  States she was diagnosed with cancer and patient has had to take her to doctors appointments and cook.  This has been extremely stressful on him and he has began to decompensate.  Over the past month she has noticed patient often talking to himself in a low tone.  He is not sleeping at night and often waking up his mother.  He is constantly setting the alarm and will not let anyone into the home.  She called EMS and told her son that she was having shortness of breath when EMS arrived he would not let them in.  He is obsessed with the remote control for the television.  This past Thursday she would not give him the remote for the TV and he jerked it away from her aggressively.  Yesterday a neighbor tried to come in and he would not let them.  He became upset with his mother picked her up and threw her onto [address]  He  has not left the home in 7 days.  This morning at 4 AM he called EMS and told them that his mom was dead and had mental problems.  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  He seems to be obsessed with medications and keeps asking his mother for more medications.  She is sure that patient has not taken any medications today.  She admits to giving patient one of her Klonopin's this past week to try to help him calm down and states that helped very little.  She was at one of her cancer treatments 1-2 days ago and he kept calling her and told  her to come home because there were aliens in the house.  She believes that patient needs to be hospitalized and that he is a danger to others.  Patient placed under involuntary commitment.   Total Time spent with patient: 30 minutes  Musculoskeletal  Strength & Muscle Tone: within normal limits Gait & Station: normal Patient leans: N/A  Psychiatric Specialty Exam  Presentation General Appearance:  Bizarre; Disheveled  Eye Contact: Fleeting  Speech: Clear and Coherent  Speech Volume: Increased  Handedness: Right   Mood and Affect  Mood: Anxious; Dysphoric; Irritable  Affect: Congruent   Thought Process  Thought Processes: Coherent  Descriptions of Associations:Intact  Orientation:Full (Time, Place and Person)  Thought Content:Delusions; Paranoid Ideation; Scattered  Diagnosis of Schizophrenia or Schizoaffective disorder in past: -- (not verified)   Hallucinations:Hallucinations: Auditory Description of Auditory Hallucinations: hears voices that tell him that people are going to kill him  Ideas of Reference:No data recorded Suicidal Thoughts:Suicidal Thoughts: No  Homicidal Thoughts:Homicidal Thoughts: No   Sensorium  Memory: Immediate Poor; Recent Poor; Remote Poor  Judgment: Poor  Insight: Lacking   Executive Functions  Concentration: Poor  Attention Span: Poor  Recall: Poor  Fund of Knowledge: Poor  Language: Poor   Psychomotor Activity  Psychomotor Activity: Psychomotor Activity: Restlessness   Assets  Assets: Physical Health; Resilience; Social Support   Sleep  Sleep: Sleep: Poor   Nutritional Assessment (For OBS and FBC admissions only) Has the patient had a weight loss or gain of 10 pounds or more in the last 3 months?: -- (unsure) Has the patient had a decrease in food intake/or appetite?: Yes Does the patient have dental problems?: No Does the patient have eating habits or behaviors that may be indicators  of an eating disorder including binging or inducing vomiting?: No Has the patient recently lost weight without trying?: 2.0 Has the patient been eating poorly because of a decreased appetite?: 1 Malnutrition Screening Tool Score: 3    Physical Exam Constitutional:      Appearance: Normal appearance.  Eyes:     General:        Right eye: No discharge.        Left eye: No discharge.  Cardiovascular:     Rate and Rhythm: Normal rate.  Pulmonary:     Effort: Pulmonary effort is normal. No respiratory distress.  Musculoskeletal:        General: Normal range of motion.     Cervical back: Normal range of motion.  Neurological:     Mental Status: He is alert and oriented to person, place, and time.  Psychiatric:        Attention and Perception: Attention normal. He perceives auditory hallucinations.        Mood and Affect: Mood is anxious.  Affect is labile.        Speech: Speech is rapid and pressured.        Behavior: Behavior is agitated. Behavior is cooperative.        Thought Content: Thought content is paranoid and delusional.        Cognition and Memory: Cognition normal.        Judgment: Judgment is impulsive.    Review of Systems  Constitutional:  Negative for chills and fever.  HENT:  Negative for hearing loss.   Respiratory:  Negative for cough and shortness of breath.   Musculoskeletal: Negative.   Neurological:  Negative for tremors, speech change and seizures.  Psychiatric/Behavioral:  Positive for hallucinations. The patient is nervous/anxious and has insomnia.     Blood pressure (!) 119/93, pulse 98, temperature 98.5 F (36.9 C), temperature source Oral, resp. rate 18, SpO2 99%. There is no height or weight on file to calculate BMI.  Past Psychiatric History: Bipolar disorder,'s PTSD, and schizophrenia  Inpatient treatment:[facility], [facility], [facility], [facility], A psychiatric hospital in [place], [name].   Outpatient services with Dr.  [name]  Is the patient at risk to self? No  Has the patient been a risk to self in the past 6 months? No .    Has the patient been a risk to self within the distant past? Yes   Is the patient a risk to others? Yes   Has the patient been a risk to others in the past 6 months? No   Has the patient been a risk to others within the distant past? No   Past Medical History:  Past Medical History:  Diagnosis Date   Anxiety    Arthritis    Disorder of pineal gland    Post traumatic stress disorder      Family History: denies  Social History: hx of polysubstance abuse:Amphetamines, Benzodiazepines, Cocaine, Marijuana, Methamphetamines   Single, lives with mother in [place], [name], unemployed, has no children.   Last Labs:  Admission on 05/17/2023  Component Date Value Ref Range Status   WBC 05/17/2023 14.1 (H)  4.0 - 10.5 K/uL Final   RBC 05/17/2023 5.64  4.22 - 5.81 MIL/uL Final   Hemoglobin 05/17/2023 17.4 (H)  13.0 - 17.0 g/dL Final   HCT 96/07/5407 49.7  39.0 - 52.0 % Final   MCV 05/17/2023 88.1  80.0 - 100.0 fL Final   MCH 05/17/2023 30.9  26.0 - 34.0 pg Final   MCHC 05/17/2023 35.0  30.0 - 36.0 g/dL Final   RDW 81/19/1478 12.6  11.5 - 15.5 % Final   Platelets 05/17/2023 288  150 - 400 K/uL Final   nRBC 05/17/2023 0.0  0.0 - 0.2 % Final   Neutrophils Relative % 05/17/2023 92  % Final   Neutro Abs 05/17/2023 12.9 (H)  1.7 - 7.7 K/uL Final   Lymphocytes Relative 05/17/2023 3  % Final   Lymphs Abs 05/17/2023 0.4 (L)  0.7 - 4.0 K/uL Final   Monocytes Relative 05/17/2023 5  % Final   Monocytes Absolute 05/17/2023 0.7  0.1 - 1.0 K/uL Final   Eosinophils Relative 05/17/2023 0  % Final   Eosinophils Absolute 05/17/2023 0.0  0.0 - 0.5 K/uL Final   Basophils Relative 05/17/2023 0  % Final   Basophils Absolute 05/17/2023 0.0  0.0 - 0.1 K/uL Final   Immature Granulocytes 05/17/2023 0  % Final   Abs Immature Granulocytes 05/17/2023 0.06  0.00 - 0.07 K/uL  Final   Performed at  [facility], [address] [place], [name] [address]   Sodium 05/17/2023 136  135 - 145 mmol/L Final   Potassium 05/17/2023 4.2  3.5 - 5.1 mmol/L Final   Chloride 05/17/2023 97 (L)  98 - 111 mmol/L Final   CO2 05/17/2023 28  22 - 32 mmol/L Final   Glucose, Bld 05/17/2023 106 (H)  70 - 99 mg/dL Final   Glucose reference range applies only to samples taken after fasting for at least 8 hours.   BUN 05/17/2023 10  6 - 20 mg/dL Final   Creatinine, Ser 05/17/2023 1.08  0.61 - 1.24 mg/dL Final   Calcium 36/64/4034 10.2  8.9 - 10.3 mg/dL Final   Total Protein 74/25/9563 8.3 (H)  6.5 - 8.1 g/dL Final   Albumin 87/56/4332 5.0  3.5 - 5.0 g/dL Final   AST 95/18/8416 26  15 - 41 U/L Final   ALT 05/17/2023 24  0 - 44 U/L Final   Alkaline Phosphatase 05/17/2023 52  38 - 126 U/L Final   Total Bilirubin 05/17/2023 0.9  0.0 - 1.2 mg/dL Final   GFR, Estimated 05/17/2023 >60  >60 mL/min Final   Comment: (NOTE) Calculated using the CKD-EPI Creatinine Equation (2021)    Anion gap 05/17/2023 11  5 - 15 Final   Performed at [facility], [address] [place], [name] [address]   Hgb A1c MFr Bld 05/17/2023 4.9  4.8 - 5.6 % Final   Comment: (NOTE) Pre diabetes:          5.7%-6.4%  Diabetes:              >6.4%  Glycemic control for   <7.0% adults with diabetes    Mean Plasma Glucose 05/17/2023 93.93  mg/dL Final   Performed at [facility], [address], [place], [name] [address]   Magnesium 05/17/2023 2.4  1.7 - 2.4 mg/dL Final   Performed at [facility], [address], [place], [name] [address]   Alcohol, Ethyl (B) 05/17/2023 <10  <10 mg/dL Final   Comment: (NOTE) Lowest detectable limit for serum alcohol is 10 mg/dL.  For medical purposes only. Performed at [facility], [address] [place], [name] [address]    Cholesterol 05/17/2023 152  0 - 200 mg/dL Final   Triglycerides 02/54/2706 70  <150 mg/dL Final   HDL 23/76/2831 78  >40 mg/dL Final    Total CHOL/HDL Ratio 05/17/2023 1.9  RATIO Final   VLDL 05/17/2023 14  0 - 40 mg/dL Final   LDL Cholesterol 05/17/2023 60  0 - 99 mg/dL Final   Comment:        Total Cholesterol/HDL:CHD Risk Coronary Heart Disease Risk Table                     Men   Women  1/2 Average Risk   3.4   3.3  Average Risk       5.0   4.4  2 X Average Risk   9.6   7.1  3 X Average Risk  23.4   11.0        Use the calculated Patient Ratio above and the CHD Risk Table to determine the patient's CHD Risk.        ATP III CLASSIFICATION (LDL):  <100     mg/dL   Optimal  517-616  mg/dL   Near or Above  Optimal  130-159  mg/dL   Borderline  130-865  mg/dL   High  >784     mg/dL   Very High Performed at [facility], [address], [place], [name] [address]    TSH 05/17/2023 2.224  0.350 - 4.500 uIU/mL Final   Comment: Performed by a 3rd Generation assay with a functional sensitivity of <=0.01 uIU/mL. Performed at [facility], [address] [place], [name] [address]    POC Amphetamine UR 05/17/2023 None Detected  NONE DETECTED (Cut Off Level 1000 ng/mL) Final   POC Secobarbital (BAR) 05/17/2023 None Detected  NONE DETECTED (Cut Off Level 300 ng/mL) Final   POC Buprenorphine (BUP) 05/17/2023 None Detected  NONE DETECTED (Cut Off Level 10 ng/mL) Final   POC Oxazepam (BZO) 05/17/2023 None Detected  NONE DETECTED (Cut Off Level 300 ng/mL) Final   POC Cocaine UR 05/17/2023 None Detected  NONE DETECTED (Cut Off Level 300 ng/mL) Final   POC Methamphetamine UR 05/17/2023 None Detected  NONE DETECTED (Cut Off Level 1000 ng/mL) Final   POC Morphine 05/17/2023 None Detected  NONE DETECTED (Cut Off Level 300 ng/mL) Final   POC Methadone UR 05/17/2023 None Detected  NONE DETECTED (Cut Off Level 300 ng/mL) Final   POC Oxycodone UR 05/17/2023 None Detected  NONE DETECTED (Cut Off Level 100 ng/mL) Final   POC Marijuana UR 05/17/2023 Positive (A)  NONE DETECTED (Cut Off Level 50 ng/mL) Final    Valproic Acid Lvl 05/17/2023 146 (H)  50.0 - 100.0 ug/mL Final   Comment: RESULT CONFIRMED BY MANUAL DILUTION Performed at [facility], [address], [place], [name] [address]   Admission on 05/15/2023, Discharged on 05/15/2023  Component Date Value Ref Range Status   WBC 05/15/2023 9.0  4.0 - 10.5 K/uL Final   RBC 05/15/2023 4.71  4.22 - 5.81 MIL/uL Final   Hemoglobin 05/15/2023 14.6  13.0 - 17.0 g/dL Final   HCT 02/09/2535 41.3  39.0 - 52.0 % Final   MCV 05/15/2023 87.7  80.0 - 100.0 fL Final   MCH 05/15/2023 31.0  26.0 - 34.0 pg Final   MCHC 05/15/2023 35.4  30.0 - 36.0 g/dL Final   RDW 64/40/3474 12.3  11.5 - 15.5 % Final   Platelets 05/15/2023 292  150 - 400 K/uL Final   nRBC 05/15/2023 0.0  0.0 - 0.2 % Final   Neutrophils Relative % 05/15/2023 85  % Final   Neutro Abs 05/15/2023 7.7  1.7 - 7.7 K/uL Final   Lymphocytes Relative 05/15/2023 8  % Final   Lymphs Abs 05/15/2023 0.7  0.7 - 4.0 K/uL Final   Monocytes Relative 05/15/2023 7  % Final   Monocytes Absolute 05/15/2023 0.6  0.1 - 1.0 K/uL Final   Eosinophils Relative 05/15/2023 0  % Final   Eosinophils Absolute 05/15/2023 0.0  0.0 - 0.5 K/uL Final   Basophils Relative 05/15/2023 0  % Final   Basophils Absolute 05/15/2023 0.0  0.0 - 0.1 K/uL Final   Immature Granulocytes 05/15/2023 0  % Final   Abs Immature Granulocytes 05/15/2023 0.02  0.00 - 0.07 K/uL Final   Performed at [facility], [address] [place], [name] [address]   Sodium 05/15/2023 135  135 - 145 mmol/L Final   Potassium 05/15/2023 3.8  3.5 - 5.1 mmol/L Final   Chloride 05/15/2023 104  98 - 111 mmol/L Final   CO2 05/15/2023 22  22 - 32 mmol/L Final  Glucose, Bld 05/15/2023 177 (H)  70 - 99 mg/dL Final   Glucose reference range applies only to samples taken after fasting for at least 8 hours.   BUN 05/15/2023 12  6 - 20 mg/dL Final   Creatinine, Ser 05/15/2023 1.04  0.61 - 1.24 mg/dL Final   Calcium 09/81/1914 9.1  8.9 -  10.3 mg/dL Final   Total Protein 78/29/5621 7.2  6.5 - 8.1 g/dL Final   Albumin 30/86/5784 4.5  3.5 - 5.0 g/dL Final   AST 69/62/9528 24  15 - 41 U/L Final   ALT 05/15/2023 20  0 - 44 U/L Final   Alkaline Phosphatase 05/15/2023 41  38 - 126 U/L Final   Total Bilirubin 05/15/2023 0.7  0.0 - 1.2 mg/dL Final   GFR, Estimated 05/15/2023 >60  >60 mL/min Final   Comment: (NOTE) Calculated using the CKD-EPI Creatinine Equation (2021)    Anion gap 05/15/2023 9  5 - 15 Final   Performed at [facility], [address] [place], [name] [address]   Alcohol, Ethyl (B) 05/15/2023 <10  <10 mg/dL Final   Comment: (NOTE) Lowest detectable limit for serum alcohol is 10 mg/dL.  For medical purposes only. Performed at [facility], [address] [place], [name] [address]    Salicylate Lvl 05/15/2023 <7.0 (L)  7.0 - 30.0 mg/dL Final   Performed at [facility], [address] [place], [name] [address]   Acetaminophen (Tylenol), Serum 05/15/2023 <10 (L)  10 - 30 ug/mL Final   Comment: (NOTE) Therapeutic concentrations vary significantly. A range of 10-30 ug/mL  may be an effective concentration for many patients. However, some  are best treated at concentrations outside of this range. Acetaminophen concentrations >150 ug/mL at 4 hours after ingestion  and >50 ug/mL at 12 hours after ingestion are often associated with  toxic reactions.  Performed at [facility], [address] [place], [name] [address]    Opiates 05/15/2023 NONE DETECTED  NONE DETECTED Final   Cocaine 05/15/2023 NONE DETECTED  NONE DETECTED Final   Benzodiazepines 05/15/2023 NONE DETECTED  NONE DETECTED Final   Amphetamines 05/15/2023 NONE DETECTED  NONE DETECTED Final   Tetrahydrocannabinol 05/15/2023 POSITIVE (A)  NONE DETECTED Final   Barbiturates 05/15/2023 NONE DETECTED  NONE DETECTED Final   Comment: (NOTE) DRUG SCREEN FOR MEDICAL  PURPOSES ONLY.  IF CONFIRMATION IS NEEDED FOR ANY PURPOSE, NOTIFY LAB WITHIN 5 DAYS.  LOWEST DETECTABLE LIMITS FOR URINE DRUG SCREEN Drug Class                     Cutoff (ng/mL) Amphetamine and metabolites    1000 Barbiturate and metabolites    200 Benzodiazepine                 200 Opiates and metabolites        300 Cocaine and metabolites        300 THC                            50 Performed at [facility], [address], [place], [name] [address]     Allergies: Hydroxyzine, Alupent [metaproterenol], Benadryl [diphenhydramine], Klonopin [clonazepam], Olanzapine, and Other  Medications:  Facility Ordered Medications  Medication   haloperidol lactate (HALDOL) injection 10 mg   And   LORazepam (ATIVAN) injection 2 mg   haloperidol lactate (HALDOL) injection 5 mg  And   LORazepam (ATIVAN) injection 2 mg   haloperidol (HALDOL) tablet 5 mg   acetaminophen (TYLENOL) tablet 650 mg   alum & mag hydroxide-simeth (MAALOX/MYLANTA) 200-200-20 MG/5ML suspension 30 mL   magnesium hydroxide (MILK OF MAGNESIA) suspension 30 mL   traZODone (DESYREL) tablet 50 mg   nicotine (NICODERM CQ - dosed in mg/24 hours) patch 14 mg   OLANZapine (ZYPREXA) tablet 5 mg   OLANZapine (ZYPREXA) tablet 10 mg   PTA Medications  Medication Sig   OLANZapine (ZYPREXA) 15 MG tablet TAKE 1 TABLET BY MOUTH AT BEDTIME (Patient not taking: Reported on 05/15/2023)   Collagen-Vitamin C-Biotin (COLLAGEN PO) Take 1 capsule by mouth daily. (Patient not taking: Reported on 05/17/2023)   divalproex (DEPAKOTE) 250 MG DR tablet Take 3 tablets (750 mg total) by mouth 2 (two) times daily. (Patient not taking: Reported on 05/17/2023)   Melatonin 10 MG TABS Take 10 mg by mouth at bedtime. (Patient not taking: Reported on 05/17/2023)   traZODone (DESYREL) 50 MG tablet Take 1 tablet (50 mg total) by mouth at bedtime. (Patient not taking: Reported on 05/17/2023)   Multiple Vitamins-Minerals (MENS MULTIVITAMIN)  TABS Take 1 tablet by mouth daily with breakfast. (Patient not taking: Reported on 05/17/2023)   LORazepam (ATIVAN) 1 MG tablet Take 1 tablet (1 mg total) by mouth 3 (three) times daily as needed for anxiety. (Patient not taking: Reported on 05/17/2023)      Medical Decision Making  Patient presents to [facility] with psychotic symptoms.  He is recommended for inpatient psychiatric admission.  He will be placed in a continuous assessment unit while awaiting inpatient psychiatric bed availability  Recommendations  Based on my evaluation the patient does not appear to have an emergency medical condition.  PT recommended for inpatient psychiatric admission.  He will be placed in a continuous assessment unit while awaiting inpatient psychiatric bed availability-[facility] notified and there is no bed availability.  Social work notified and patient faxed out  Medications:  Will hold Depakote at this time due to elevated valproic acid level 146-  Start Zyprexa 5 mg every morning and 10 mg nightly  Lab Orders         CBC with Differential/Platelet         Comprehensive metabolic panel         Hemoglobin A1c         Magnesium         Ethanol         Lipid panel         TSH         Valproic acid level         Ammonia         POCT Urine Drug Screen - (I-Screen)      EKG   Ardis Hughs, NP 05/17/23  4:16 PM

## 2023-05-17 NOTE — ED Provider Notes (Cosign Needed Addendum)
Notified by Nursing that patient has temperature of 100.6.  Assessed patient and he is denying any physical concerns such as dizziness, headache, nausea, diarrhea, tremors, SOB, congestion or chest pain. His skin is a bit damp.   Consulted with Dr. Enedina Finner, discussed Ammonia level of 64 and WBC of 14.1. Will order Covid and Flu, but flu testing kits may not be available. Will order Ammonia level daily.   Notified nursing.

## 2023-05-18 ENCOUNTER — Encounter (HOSPITAL_COMMUNITY): Payer: Self-pay | Admitting: Psychiatry

## 2023-05-18 ENCOUNTER — Other Ambulatory Visit: Payer: Self-pay

## 2023-05-18 ENCOUNTER — Inpatient Hospital Stay (HOSPITAL_COMMUNITY)
Admission: AD | Admit: 2023-05-18 | Discharge: 2023-05-27 | DRG: 885 | Disposition: A | Discharge status: Home / Self Care | Payer: No Typology Code available for payment source | Source: Intra-hospital | Attending: Psychiatry | Admitting: Psychiatry

## 2023-05-18 DIAGNOSIS — F411 Generalized anxiety disorder: Secondary | ICD-10-CM | POA: Diagnosis present

## 2023-05-18 DIAGNOSIS — F4310 Post-traumatic stress disorder, unspecified: Secondary | ICD-10-CM | POA: Diagnosis present

## 2023-05-18 DIAGNOSIS — F319 Bipolar disorder, unspecified: Secondary | ICD-10-CM | POA: Insufficient documentation

## 2023-05-18 DIAGNOSIS — F259 Schizoaffective disorder, unspecified: Secondary | ICD-10-CM | POA: Insufficient documentation

## 2023-05-18 DIAGNOSIS — Z79899 Other long term (current) drug therapy: Secondary | ICD-10-CM | POA: Diagnosis not present

## 2023-05-18 DIAGNOSIS — F1290 Cannabis use, unspecified, uncomplicated: Secondary | ICD-10-CM | POA: Insufficient documentation

## 2023-05-18 DIAGNOSIS — R7989 Other specified abnormal findings of blood chemistry: Secondary | ICD-10-CM | POA: Diagnosis present

## 2023-05-18 DIAGNOSIS — F250 Schizoaffective disorder, bipolar type: Principal | ICD-10-CM | POA: Diagnosis present

## 2023-05-18 DIAGNOSIS — Z720 Tobacco use: Secondary | ICD-10-CM

## 2023-05-18 DIAGNOSIS — F40240 Claustrophobia: Secondary | ICD-10-CM | POA: Diagnosis present

## 2023-05-18 DIAGNOSIS — E872 Acidosis, unspecified: Secondary | ICD-10-CM | POA: Diagnosis present

## 2023-05-18 DIAGNOSIS — F1210 Cannabis abuse, uncomplicated: Secondary | ICD-10-CM | POA: Diagnosis present

## 2023-05-18 DIAGNOSIS — Z716 Tobacco abuse counseling: Secondary | ICD-10-CM

## 2023-05-18 LAB — CBC WITH DIFFERENTIAL/PLATELET
Abs Immature Granulocytes: 0.02 10*3/uL (ref 0.00–0.07)
Basophils Absolute: 0 10*3/uL (ref 0.0–0.1)
Basophils Relative: 1 %
Eosinophils Absolute: 0 10*3/uL (ref 0.0–0.5)
Eosinophils Relative: 0 %
HCT: 47.3 % (ref 39.0–52.0)
Hemoglobin: 16.7 g/dL (ref 13.0–17.0)
Immature Granulocytes: 0 %
Lymphocytes Relative: 28 %
Lymphs Abs: 1.5 10*3/uL (ref 0.7–4.0)
MCH: 30.9 pg (ref 26.0–34.0)
MCHC: 35.3 g/dL (ref 30.0–36.0)
MCV: 87.4 fL (ref 80.0–100.0)
Monocytes Absolute: 0.7 10*3/uL (ref 0.1–1.0)
Monocytes Relative: 13 %
Neutro Abs: 3.2 10*3/uL (ref 1.7–7.7)
Neutrophils Relative %: 58 %
Platelets: 259 10*3/uL (ref 150–400)
RBC: 5.41 MIL/uL (ref 4.22–5.81)
RDW: 12.9 % (ref 11.5–15.5)
WBC: 5.5 10*3/uL (ref 4.0–10.5)
nRBC: 0 % (ref 0.0–0.2)

## 2023-05-18 LAB — CK: Total CK: 326 U/L (ref 49–397)

## 2023-05-18 LAB — HEPATITIS PANEL, ACUTE
HCV Ab: NONREACTIVE
Hep A IgM: NONREACTIVE
Hep B C IgM: NONREACTIVE
Hepatitis B Surface Ag: NONREACTIVE

## 2023-05-18 LAB — AMMONIA: Ammonia: 50 umol/L — ABNORMAL HIGH (ref 9–35)

## 2023-05-18 MED ORDER — MELATONIN 5 MG PO TABS
10.0000 mg | ORAL_TABLET | Freq: Every day | ORAL | Status: DC
  Administered 2023-05-18 – 2023-05-26 (×9): 10 mg via ORAL
  Filled 2023-05-18 (×10): qty 2
  Filled 2023-05-18: qty 1
  Filled 2023-05-18 (×5): qty 2

## 2023-05-18 MED ORDER — ADULT MULTIVITAMIN W/MINERALS CH
1.0000 | ORAL_TABLET | Freq: Every day | ORAL | Status: DC
  Administered 2023-05-19 – 2023-05-27 (×9): 1 via ORAL
  Filled 2023-05-18 (×12): qty 1

## 2023-05-18 MED ORDER — ACETAMINOPHEN 325 MG PO TABS
650.0000 mg | ORAL_TABLET | Freq: Four times a day (QID) | ORAL | Status: DC | PRN
  Administered 2023-05-19 – 2023-05-25 (×8): 650 mg via ORAL
  Filled 2023-05-18 (×8): qty 2

## 2023-05-18 MED ORDER — TRAZODONE HCL 50 MG PO TABS
50.0000 mg | ORAL_TABLET | Freq: Every evening | ORAL | Status: DC | PRN
  Administered 2023-05-18 – 2023-05-26 (×9): 50 mg via ORAL
  Filled 2023-05-18 (×3): qty 1
  Filled 2023-05-18: qty 7
  Filled 2023-05-18 (×6): qty 1

## 2023-05-18 MED ORDER — NICOTINE 14 MG/24HR TD PT24
14.0000 mg | MEDICATED_PATCH | Freq: Every day | TRANSDERMAL | Status: DC
  Administered 2023-05-19 – 2023-05-27 (×9): 14 mg via TRANSDERMAL
  Filled 2023-05-18 (×12): qty 1

## 2023-05-18 MED ORDER — OLANZAPINE 5 MG PO TABS: 5.0000 mg | ORAL_TABLET | Freq: Every day | ORAL | Status: DC

## 2023-05-18 MED ORDER — OLANZAPINE 10 MG PO TABS
10.0000 mg | ORAL_TABLET | Freq: Every day | ORAL | Status: DC
  Administered 2023-05-18 – 2023-05-22 (×5): 10 mg via ORAL
  Filled 2023-05-18 (×10): qty 1

## 2023-05-18 MED ORDER — OLANZAPINE 10 MG IM SOLR: 5.0000 mg | Freq: Three times a day (TID) | INTRAMUSCULAR | Status: DC | PRN

## 2023-05-18 MED ORDER — OLANZAPINE 5 MG PO TABS
5.0000 mg | ORAL_TABLET | Freq: Every day | ORAL | Status: DC
  Administered 2023-05-19 – 2023-05-23 (×5): 5 mg via ORAL
  Filled 2023-05-18 (×10): qty 1

## 2023-05-18 MED ORDER — LORAZEPAM 1 MG PO TABS
1.0000 mg | ORAL_TABLET | Freq: Once | ORAL | Status: AC
  Administered 2023-05-18: 1 mg via ORAL
  Filled 2023-05-18: qty 1

## 2023-05-18 MED ORDER — MAGNESIUM HYDROXIDE 400 MG/5ML PO SUSP
30.0000 mL | Freq: Every day | ORAL | Status: DC | PRN
  Administered 2023-05-21: 30 mL via ORAL
  Filled 2023-05-18: qty 30

## 2023-05-18 MED ORDER — OLANZAPINE 10 MG IM SOLR: 10.0000 mg | Freq: Three times a day (TID) | INTRAMUSCULAR | Status: DC | PRN

## 2023-05-18 MED ORDER — NICOTINE POLACRILEX 2 MG MT GUM
4.0000 mg | CHEWING_GUM | OROMUCOSAL | Status: DC
  Administered 2023-05-18: 4 mg via ORAL
  Administered 2023-05-19: 2 mg via ORAL
  Filled 2023-05-18 (×3): qty 2

## 2023-05-18 MED ORDER — ENSURE ENLIVE PO LIQD
237.0000 mL | Freq: Two times a day (BID) | ORAL | Status: DC
  Administered 2023-05-19 – 2023-05-26 (×12): 237 mL via ORAL
  Filled 2023-05-18 (×21): qty 237

## 2023-05-18 MED ORDER — OLANZAPINE 10 MG PO TABS: 10.0000 mg | ORAL_TABLET | Freq: Every day | ORAL | Status: DC

## 2023-05-18 MED ORDER — OLANZAPINE 5 MG PO TBDP
5.0000 mg | ORAL_TABLET | Freq: Three times a day (TID) | ORAL | Status: DC | PRN
  Administered 2023-05-18 – 2023-05-23 (×3): 5 mg via ORAL
  Filled 2023-05-18 (×3): qty 1

## 2023-05-18 MED ORDER — ALUM & MAG HYDROXIDE-SIMETH 200-200-20 MG/5ML PO SUSP
30.0000 mL | ORAL | Status: DC | PRN
  Administered 2023-05-20: 30 mL via ORAL
  Filled 2023-05-18: qty 30
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
## 2023-05-18 NOTE — ED Notes (Signed)
Pt resting quietly with eyes closed.  No pain or discomfort noted/voiced.  Breathing is even and unlabored.  Will continue to monitor for safety.  

## 2023-05-18 NOTE — Progress Notes (Addendum)
Pt was accepted to [facility] TODAY 05/18/2023; Bed Assignment 406-1 PENDING Labs  DX: Bipolar .DO. / Psychosis unspecified  Pt meets inpatient criteria per Julaine Fusi  Attending Physician will be Dr. Phineas Inches, MD  Report can be called to: -Adult unit: [phone]  Pt can arrive after: [facility] will coordinate with care team.  Care Team notified:[facility] Delena Bali, Heather Smith,RN, [place], Bennie Dallas [facility] [place], [place] 05/18/2023 @ 2:03 PM
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
## 2023-05-18 NOTE — Progress Notes (Signed)
Patient asleep with breathing even and unlabored. Even rise and fall of chest noted. No s/s of current distress.

## 2023-05-18 NOTE — ED Provider Notes (Signed)
FBC/OBS ASAP Discharge Summary  Date and Time: 05/18/2023 2:57 PM  Name: Ronald [name]  MRN:  [id number]   Discharge Diagnoses:  Final diagnoses:  Psychosis, unspecified psychosis type [facility])  HPI: patient presented to [facility] on 05/17/2023 as a walk in  accompanied by Law Enforcement with complaints of ""I need my meds and want to go home"   Ronald [name], 43 y.o., male patient seen face to face by this provider, consulted with Dr. Enedina Finner; and chart reviewed on 05/18/23.     Subjective:   Upon assessment patient is observed laying in his bed asleep.  He is easily awakened.  He is alert/oriented  to self and time.  He is not oriented to situation.  His speech is clear, coherent, at a normal rate and tone.  He is anxious and has been compliant with medications.  He is guarded and answers all questions with "no".  He denies any concerns with appetite or sleep.  He denies any depressive symptoms.  He continues to appear paranoid.  He is denying SI/HI/AVH at this time.  He is tolerating Zyprexa without any adverse reactions.  Patient has received no agitation medications in the past 24 hours.  He has been cooperative and compliant with staff  Stay Summary:   Patient accepted to [facility] for inpatient psychiatric admission.  Total Time spent with patient: 20 minutes  Past Psychiatric History: see H&P Past Medical History: see H&P Family History: see H&P Family Psychiatric History: see H&P Social History: see H&P Tobacco Cessation:  N/A, patient does not currently use tobacco products  Current Medications:  Current Facility-Administered Medications  Medication Dose Route Frequency Provider Last Rate Last Admin   acetaminophen (TYLENOL) tablet 650 mg  650 mg Oral Q6H PRN Ardis Hughs, NP       alum & mag hydroxide-simeth (MAALOX/MYLANTA) 200-200-20 MG/5ML suspension 30 mL  30 mL Oral Q4H PRN Ardis Hughs, NP       haloperidol (HALDOL) tablet 5 mg  5 mg Oral TID PRN Ardis Hughs, NP       haloperidol lactate (HALDOL) injection 10 mg  10 mg Intramuscular TID PRN Ardis Hughs, NP   10 mg at 05/17/23 1005   And   LORazepam (ATIVAN) injection 2 mg  2 mg Intramuscular TID PRN Ardis Hughs, NP       haloperidol lactate (HALDOL) injection 5 mg  5 mg Intramuscular TID PRN Ardis Hughs, NP       And   LORazepam (ATIVAN) injection 2 mg  2 mg Intramuscular TID PRN Ardis Hughs, NP   2 mg at 05/17/23 1004   magnesium hydroxide (MILK OF MAGNESIA) suspension 30 mL  30 mL Oral Daily PRN Ardis Hughs, NP       nicotine (NICODERM CQ - dosed in mg/24 hours) patch 14 mg  14 mg Transdermal Daily Rex Kras, MD   14 mg at 05/18/23 1030   OLANZapine (ZYPREXA) tablet 10 mg  10 mg Oral QHS Ardis Hughs, NP   10 mg at 05/17/23 2121   OLANZapine (ZYPREXA) tablet 5 mg  5 mg Oral Daily Ardis Hughs, NP   5 mg at 05/18/23 1029   traZODone (DESYREL) tablet 50 mg  50 mg Oral QHS PRN Ardis Hughs, NP   50 mg at 05/17/23 2121   Current Outpatient Medications  Medication Sig Dispense Refill   Collagen-Vitamin C-Biotin (COLLAGEN PO) Take 1 capsule  by mouth daily. (Patient not taking: Reported on 05/17/2023)     divalproex (DEPAKOTE) 250 MG DR tablet Take 3 tablets (750 mg total) by mouth 2 (two) times daily. (Patient not taking: Reported on 05/17/2023) 180 tablet 0   LORazepam (ATIVAN) 1 MG tablet Take 1 tablet (1 mg total) by mouth 3 (three) times daily as needed for anxiety. (Patient not taking: Reported on 05/17/2023) 10 tablet 0   Melatonin 10 MG TABS Take 10 mg by mouth at bedtime. (Patient not taking: Reported on 05/17/2023)     Multiple Vitamins-Minerals (MENS MULTIVITAMIN) TABS Take 1 tablet by mouth daily with breakfast. (Patient not taking: Reported on 05/17/2023) 30 tablet 3   OLANZapine (ZYPREXA) 10 MG tablet Take 1 tablet (10 mg total) by mouth at bedtime.     [START ON 05/19/2023] OLANZapine (ZYPREXA) 5 MG tablet Take 1 tablet (5 mg total)  by mouth daily.     traZODone (DESYREL) 50 MG tablet Take 1 tablet (50 mg total) by mouth at bedtime. (Patient not taking: Reported on 05/17/2023) 90 tablet 1    PTA Medications:  Facility Ordered Medications  Medication   haloperidol lactate (HALDOL) injection 10 mg   And   LORazepam (ATIVAN) injection 2 mg   haloperidol lactate (HALDOL) injection 5 mg   And   LORazepam (ATIVAN) injection 2 mg   haloperidol (HALDOL) tablet 5 mg   acetaminophen (TYLENOL) tablet 650 mg   alum & mag hydroxide-simeth (MAALOX/MYLANTA) 200-200-20 MG/5ML suspension 30 mL   magnesium hydroxide (MILK OF MAGNESIA) suspension 30 mL   traZODone (DESYREL) tablet 50 mg   nicotine (NICODERM CQ - dosed in mg/24 hours) patch 14 mg   OLANZapine (ZYPREXA) tablet 5 mg   OLANZapine (ZYPREXA) tablet 10 mg   PTA Medications  Medication Sig   Collagen-Vitamin C-Biotin (COLLAGEN PO) Take 1 capsule by mouth daily. (Patient not taking: Reported on 05/17/2023)   divalproex (DEPAKOTE) 250 MG DR tablet Take 3 tablets (750 mg total) by mouth 2 (two) times daily. (Patient not taking: Reported on 05/17/2023)   Melatonin 10 MG TABS Take 10 mg by mouth at bedtime. (Patient not taking: Reported on 05/17/2023)   traZODone (DESYREL) 50 MG tablet Take 1 tablet (50 mg total) by mouth at bedtime. (Patient not taking: Reported on 05/17/2023)   Multiple Vitamins-Minerals (MENS MULTIVITAMIN) TABS Take 1 tablet by mouth daily with breakfast. (Patient not taking: Reported on 05/17/2023)   LORazepam (ATIVAN) 1 MG tablet Take 1 tablet (1 mg total) by mouth 3 (three) times daily as needed for anxiety. (Patient not taking: Reported on 05/17/2023)   OLANZapine (ZYPREXA) 10 MG tablet Take 1 tablet (10 mg total) by mouth at bedtime.   [START ON 05/19/2023] OLANZapine (ZYPREXA) 5 MG tablet Take 1 tablet (5 mg total) by mouth daily.       11/08/2022   10:00 AM 01/14/2022    1:09 PM 09/19/2021    1:54 PM  Depression screen PHQ 2/9  Decreased Interest 0 0 0  Down,  Depressed, Hopeless 0 0 0  PHQ - 2 Score 0 0 0  Altered sleeping 0 0 0  Tired, decreased energy 0 0 0  Change in appetite 0 0 0  Feeling bad or failure about yourself  0 0 0  Trouble concentrating 0 0 0  Moving slowly or fidgety/restless 0 0 0  Suicidal thoughts 0 0 0  PHQ-9 Score 0 0 0  Difficult doing work/chores   Not difficult at all  [name] from 05/17/2023 in [facility] ED from 05/15/2023 in [facility] Emergency Department at [facility] ED from 03/18/2023 in [facility] Emergency Department at [facility]  C-SSRS RISK CATEGORY No Risk No Risk No Risk       Musculoskeletal  Strength & Muscle Tone: within normal limits Gait & Station: normal Patient leans: N/A  Psychiatric Specialty Exam  Presentation  General Appearance:  Bizarre; Disheveled  Eye Contact: Fleeting  Speech: Clear and Coherent  Speech Volume: Increased  Handedness: Right   Mood and Affect  Mood: Anxious; Dysphoric; Irritable  Affect: Congruent   Thought Process  Thought Processes: Coherent  Descriptions of Associations:Intact  Orientation:Full (Time, Place and Person)  Thought Content:Delusions; Paranoid Ideation; Scattered  Diagnosis of Schizophrenia or Schizoaffective disorder in past: -- (not verified)  Duration of Psychotic Symptoms: Less than six months   Hallucinations:Hallucinations: Auditory Description of Auditory Hallucinations: hears voices that tell him that people are going to kill him  Ideas of Reference:No data recorded Suicidal Thoughts:Suicidal Thoughts: No  Homicidal Thoughts:Homicidal Thoughts: No   Sensorium  Memory: Immediate Poor; Recent Poor; Remote Poor  Judgment: Poor  Insight: Lacking   Executive Functions  Concentration: Poor  Attention Span: Poor  Recall: Poor  Fund of Knowledge: Poor  Language: Poor   Psychomotor Activity  Psychomotor Activity: Psychomotor  Activity: Restlessness   Assets  Assets: Physical Health; Resilience; Social Support   Sleep  Sleep: Sleep: Poor   Nutritional Assessment (For OBS and FBC admissions only) Has the patient had a weight loss or gain of 10 pounds or more in the last 3 months?: -- (unsure) Has the patient had a decrease in food intake/or appetite?: Yes Does the patient have dental problems?: No Does the patient have eating habits or behaviors that may be indicators of an eating disorder including binging or inducing vomiting?: No Has the patient recently lost weight without trying?: 2.0 Has the patient been eating poorly because of a decreased appetite?: 1 Malnutrition Screening Tool Score: 3    Physical Exam  Physical Exam Constitutional:      Appearance: Normal appearance.  Eyes:     General:        Right eye: No discharge.        Left eye: No discharge.  Cardiovascular:     Rate and Rhythm: Normal rate.  Pulmonary:     Effort: Pulmonary effort is normal. No respiratory distress.  Musculoskeletal:        General: Normal range of motion.     Cervical back: Normal range of motion.  Neurological:     Mental Status: He is alert and oriented to person, place, and time.  Psychiatric:        Attention and Perception: Attention and perception normal.        Mood and Affect: Mood is anxious.        Speech: Speech normal.        Behavior: Behavior is withdrawn.        Thought Content: Thought content is paranoid.        Cognition and Memory: Cognition normal.        Judgment: Judgment is impulsive.    Review of Systems  Constitutional:  Negative for chills and fever.  HENT:  Negative for hearing loss.   Respiratory:  Negative for cough and shortness of breath.   Cardiovascular:  Negative for chest pain.  Gastrointestinal:  Negative for diarrhea and vomiting.  Musculoskeletal: Negative.   Neurological:  Negative for seizures.  Psychiatric/Behavioral:  The patient is nervous/anxious.     Blood pressure 121/83, pulse 82, temperature 97.7 F (36.5 C), temperature source Oral, resp. rate 16, SpO2 99%. There is no height or weight on file to calculate BMI.    Disposition:   Patient continues to meet criteria for inpatient psychiatric admission.  [facility] notified and patient has been accepted.  Lab orders requested: CK, repeat ammonia, CBC, lactic acid, and hepatitis panel    Ardis Hughs, NP 05/18/2023, 2:57 PM

## 2023-05-18 NOTE — Discharge Instructions (Signed)
Transfer to [name] for IP admission, Dr. Phineas Inches is the accepting MD

## 2023-05-18 NOTE — Group Note (Signed)
Date:  05/18/2023 Time:  9:14 PM  Group Topic/Focus:  Wrap-Up Group:   The focus of this group is to help patients review their daily goal of treatment and discuss progress on daily workbooks.    Participation Level:  Minimal  Participation Quality:  Appropriate and Sharing  Affect:  Appropriate and Irritable  Cognitive:  Appropriate  Insight: Appropriate  Engagement in Group:  Engaged and Lacking  Modes of Intervention:  Activity and Socialization  Additional Comments:  Patient interrupted and interjected while another patient was sharing. Patient shared his day was a 10/10. Patient shared his goal is medication regulation and meeting with his mother. Patient participated in activity after sharing and asked if he could leave.   Ronald [name] 05/18/2023, 9:14 PM

## 2023-05-18 NOTE — ED Notes (Signed)
Patient is transferring to [facility] at this time via safe transport. IVC paperwork, EMTALA, and other transfer paperwork sent with patient and provided to transport. Patient denies SI,HI, and A/V/H. Calm and cooperative. All valuables/belongings sent with patient. Patient in no current distress.

## 2023-05-19 ENCOUNTER — Telehealth: Payer: Self-pay | Admitting: Internal Medicine

## 2023-05-19 ENCOUNTER — Encounter (HOSPITAL_COMMUNITY): Payer: Self-pay

## 2023-05-19 ENCOUNTER — Other Ambulatory Visit: Payer: Self-pay

## 2023-05-19 ENCOUNTER — Encounter (HOSPITAL_COMMUNITY): Payer: Self-pay | Admitting: Emergency Medicine

## 2023-05-19 DIAGNOSIS — F250 Schizoaffective disorder, bipolar type: Secondary | ICD-10-CM | POA: Diagnosis not present

## 2023-05-19 DIAGNOSIS — F1290 Cannabis use, unspecified, uncomplicated: Secondary | ICD-10-CM | POA: Insufficient documentation

## 2023-05-19 LAB — LACTIC ACID, PLASMA: Lactic Acid, Venous: 2.2 mmol/L (ref 0.5–1.9)

## 2023-05-19 LAB — VALPROIC ACID LEVEL: Valproic Acid Lvl: 102 ug/mL — ABNORMAL HIGH (ref 50.0–100.0)

## 2023-05-19 MED ORDER — NICOTINE POLACRILEX 2 MG MT GUM: 4.0000 mg | CHEWING_GUM | OROMUCOSAL | Status: DC | PRN

## 2023-05-19 MED ORDER — OLANZAPINE 5 MG PO TABS: 5.0000 mg | ORAL_TABLET | Freq: Every day | ORAL | Status: DC

## 2023-05-19 MED ORDER — SODIUM CHLORIDE 0.9 % IV BOLUS
500.0000 mL | Freq: Once | INTRAVENOUS | Status: AC
  Administered 2023-05-20: 500 mL via INTRAVENOUS

## 2023-05-19 MED ORDER — NICOTINE POLACRILEX 2 MG MT GUM
2.0000 mg | CHEWING_GUM | OROMUCOSAL | Status: DC | PRN
  Administered 2023-05-19 – 2023-05-27 (×32): 2 mg via ORAL
  Filled 2023-05-19 (×4): qty 1

## 2023-05-19 NOTE — Telephone Encounter (Signed)
Copied from CRM [id number]. Topic: General - Other >> May 19, 2023  8:53 AM Sim Boast F wrote: Reason for CRM: Patient mother wants you to know that Ronald [name] is extremely helpful with her sons care, he's been admitted to the [facility] center due to caregiver fatigue and other things - also wants to let Dr. Alvy Bimler know that she is doing well and she has help from her neighbors.

## 2023-05-19 NOTE — Progress Notes (Signed)
Patient sent to [facility] for medical clearance. Had a critical lab
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
## 2023-05-19 NOTE — Progress Notes (Signed)
HPI: Patient is a 43 year old Caucasian male with prior mental health diagnoses of anxiety, cannabis use disorder, PTSD & Benzodiazepine abuse, who presented to the the [facility]) on 2/3 via law enforcement after his mother called for assistance for worsening aggressive behaviors & paranoia at home as per information obtained from his mother by provider at [facility]. "He has begun talking to himself in response to hearing voices. He reports they are faint whispers. Accoreding to mother, the patient appears to be paranoid, as he is fixated with the TV remote and refuses to leave home. He also will refuse to open the door to the neighbors who come to check on them".      Patient is under Involuntary commitment.  This provider is covering the [facility] remotely tonight and received notification for critical labs from patient's nurse. "Lab called with critical value: Lactic Acid 2.2" .  Current VS BP 139/91, T 99.1, R18, P91, O2 sat 98%.  Recommend transfer to [facility] for medical clearance.  Recommend return to the [facility] when medically stable to continue his psychiatric  care.   I consulted with Dr. Zonia Kief at [facility] the provider has agreed to accept the patient.
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
## 2023-05-19 NOTE — Progress Notes (Signed)
Pt states Zyprexa causes increased anxiety

## 2023-05-19 NOTE — Group Note (Signed)
Date:  05/19/2023 Time:  9:02 PM  Group Topic/Focus:  Wrap-Up Group:   The focus of this group is to help patients review their daily goal of treatment and discuss progress on daily workbooks.    Participation Level:  Active  Participation Quality:  Appropriate and Sharing  Affect:  Appropriate  Cognitive:  Appropriate  Insight: Appropriate  Engagement in Group:  Engaged  Modes of Intervention:  Activity and Socialization  Additional Comments:  Patients used "wrap up group sheets" as guide when sharing. Patient shared his day being a 10/10. Patient goal for today was to "regulate medication and return to my mother and puppy" and patient shared "progress is being made I feel". Patient shared "silent meditation, socialization, medication, art and music" being a coping skills that he finds most helpful. Patient shared something that he likes about himself, "I get along with everyone and very easy going". Patient participated in activity after sharing.     Ronald [name] 05/19/2023, 9:02 PM

## 2023-05-19 NOTE — ED Triage Notes (Addendum)
Pt in from [facility] via [facility], arrives with sitter from facility. Pt here for elevated depakote and lactic level (2.0). Denies any complaints at this time, is at [facility] for psyhcosis admission

## 2023-05-19 NOTE — Progress Notes (Signed)
   05/19/23 0759  Psych Admission Type (Psych Patients Only)  Admission Status Involuntary  Psychosocial Assessment  Patient Complaints None  Eye Contact Fair  Facial Expression Animated  Affect Appropriate to circumstance  Speech Logical/coherent  Interaction Assertive  Motor Activity Slow  Appearance/Hygiene Unremarkable  Behavior Characteristics Cooperative  Mood Depressed  Thought Process  Coherency WDL  Content WDL  Delusions None reported or observed  Perception WDL  Hallucination None reported or observed  Judgment Poor  Confusion None  Danger to Self  Current suicidal ideation? Denies  Agreement Not to Harm Self Yes  Description of Agreement verbal  Danger to Others  Danger to Others None reported or observed

## 2023-05-19 NOTE — Plan of Care (Signed)
Problem: Education: Goal: Verbalization of understanding the information provided will improve Outcome: Progressing   Problem: Activity: Goal: Interest or engagement in activities will improve Outcome: Progressing: pt joined wrap up group   Problem: Education: Goal: Knowledge of the prescribed therapeutic regimen will improve Outcome: Progressing: Pt verbalized understanding of hold on depakote d/t VPA levels    Problem: Activity: Goal: Will identify at least one activity in which they can participate Outcome: Progressing: pt identified drawing/coloring as a positive coping activity

## 2023-05-19 NOTE — BH IP Treatment Plan (Signed)
Interdisciplinary Treatment and Diagnostic Plan Update  05/19/2023 Time of Session: 10:53AM BACH ROCCHI MRN: [id number]  Principal Diagnosis: Schizoaffective disorder, bipolar type [facility])  Secondary Diagnoses: Principal Problem:   Schizoaffective disorder, bipolar type (HCC) Active Problems:   GAD (generalized anxiety disorder)   Cannabis use disorder   Current Medications:  Current Facility-Administered Medications  Medication Dose Route Frequency Provider Last Rate Last Admin   acetaminophen (TYLENOL) tablet 650 mg  650 mg Oral Q6H PRN Ardis Hughs, NP       alum & mag hydroxide-simeth (MAALOX/MYLANTA) 200-200-20 MG/5ML suspension 30 mL  30 mL Oral Q4H PRN Ardis Hughs, NP       feeding supplement (ENSURE ENLIVE / ENSURE PLUS) liquid 237 mL  237 mL Oral BID BM Massengill, Harrold Donath, MD   237 mL at 05/19/23 1401   magnesium hydroxide (MILK OF MAGNESIA) suspension 30 mL  30 mL Oral Daily PRN Ardis Hughs, NP       melatonin tablet 10 mg  10 mg Oral QHS [name], [name], NP   10 mg at 05/18/23 2150   multivitamin with minerals tablet 1 tablet  1 tablet Oral Q breakfast [name], [name], NP   1 tablet at 05/19/23 0759   nicotine (NICODERM CQ - dosed in mg/24 hours) patch 14 mg  14 mg Transdermal Daily Ardis Hughs, NP   14 mg at 05/19/23 0800   nicotine polacrilex (NICORETTE) gum 2 mg  2 mg Oral Q4H PRN [name], [name], NP   2 mg at 05/19/23 1504   OLANZapine (ZYPREXA) injection 10 mg  10 mg Intramuscular TID PRN Ardis Hughs, NP       OLANZapine (ZYPREXA) injection 5 mg  5 mg Intramuscular TID PRN Ardis Hughs, NP       OLANZapine (ZYPREXA) tablet 10 mg  10 mg Oral QHS Vernard Gambles H, NP   10 mg at 05/18/23 2048   OLANZapine (ZYPREXA) tablet 5 mg  5 mg Oral Daily Ardis Hughs, NP   5 mg at 05/19/23 0800   OLANZapine zydis (ZYPREXA) disintegrating tablet 5 mg  5 mg Oral TID PRN Ardis Hughs, NP   5 mg at 05/19/23 1435   traZODone  (DESYREL) tablet 50 mg  50 mg Oral QHS PRN Ardis Hughs, NP   50 mg at 05/18/23 2049   PTA Medications: Medications Prior to Admission  Medication Sig Dispense Refill Last Dose/Taking   Collagen-Vitamin C-Biotin (COLLAGEN PO) Take 1 capsule by mouth daily. (Patient not taking: Reported on 05/17/2023)      divalproex (DEPAKOTE) 250 MG DR tablet Take 3 tablets (750 mg total) by mouth 2 (two) times daily. (Patient not taking: Reported on 05/17/2023) 180 tablet 0    LORazepam (ATIVAN) 1 MG tablet Take 1 tablet (1 mg total) by mouth 3 (three) times daily as needed for anxiety. (Patient not taking: Reported on 05/17/2023) 10 tablet 0    Melatonin 10 MG TABS Take 10 mg by mouth at bedtime. (Patient not taking: Reported on 05/17/2023)      Multiple Vitamins-Minerals (MENS MULTIVITAMIN) TABS Take 1 tablet by mouth daily with breakfast. (Patient not taking: Reported on 05/17/2023) 30 tablet 3    OLANZapine (ZYPREXA) 10 MG tablet Take 1 tablet (10 mg total) by mouth at bedtime.      OLANZapine (ZYPREXA) 5 MG tablet Take 1 tablet (5 mg total) by mouth daily.      traZODone (DESYREL) 50 MG tablet Take  1 tablet (50 mg total) by mouth at bedtime. (Patient not taking: Reported on 05/17/2023) 90 tablet 1     Patient Stressors: Financial difficulties   Loss of multiple family members (brother, father, grandfather, cousin)   Medication change or noncompliance   Traumatic event    Patient Strengths: Ability for insight  Personnel officer means  Physical Health  Supportive family/friends   Treatment Modalities: Medication Management, Group therapy, Case management,  1 to 1 session with clinician, Psychoeducation, Recreational therapy.   Physician Treatment Plan for Primary Diagnosis: Schizoaffective disorder, bipolar type (HCC) Long Term Goal(s):     Short Term Goals:    Medication Management: Evaluate patient's response, side effects, and tolerance of medication regimen.  Therapeutic  Interventions: 1 to 1 sessions, Unit Group sessions and Medication administration.  Evaluation of Outcomes: Not Progressing  Physician Treatment Plan for Secondary Diagnosis: Principal Problem:   Schizoaffective disorder, bipolar type (HCC) Active Problems:   GAD (generalized anxiety disorder)   Cannabis use disorder  Long Term Goal(s):     Short Term Goals:       Medication Management: Evaluate patient's response, side effects, and tolerance of medication regimen.  Therapeutic Interventions: 1 to 1 sessions, Unit Group sessions and Medication administration.  Evaluation of Outcomes: Not Progressing   RN Treatment Plan for Primary Diagnosis: Schizoaffective disorder, bipolar type (HCC) Long Term Goal(s): Knowledge of disease and therapeutic regimen to maintain health will improve  Short Term Goals: Ability to participate in decision making will improve, Ability to verbalize feelings will improve, Ability to identify and develop effective coping behaviors will improve, and Compliance with prescribed medications will improve  Medication Management: RN will administer medications as ordered by provider, will assess and evaluate patient's response and provide education to patient for prescribed medication. RN will report any adverse and/or side effects to prescribing provider.  Therapeutic Interventions: 1 on 1 counseling sessions, Psychoeducation, Medication administration, Evaluate responses to treatment, Monitor vital signs and CBGs as ordered, Perform/monitor CIWA, COWS, AIMS and Fall Risk screenings as ordered, Perform wound care treatments as ordered.  Evaluation of Outcomes: Not Progressing   LCSW Treatment Plan for Primary Diagnosis: Schizoaffective disorder, bipolar type (HCC) Long Term Goal(s): Safe transition to appropriate next level of care at discharge, Engage patient in therapeutic group addressing interpersonal concerns.  Short Term Goals: Engage patient in aftercare  planning with referrals and resources, Increase social support, Increase ability to appropriately verbalize feelings, Increase emotional regulation, and Identify triggers associated with mental health/substance abuse issues  Therapeutic Interventions: Assess for all discharge needs, 1 to 1 time with Social worker, Explore available resources and support systems, Assess for adequacy in community support network, Educate family and significant other(s) on suicide prevention, Complete Psychosocial Assessment, Interpersonal group therapy.  Evaluation of Outcomes: Not Progressing   Progress in Treatment: Attending groups: Yes. Participating in groups: Yes. Taking medication as prescribed: Yes. Toleration medication: Yes. Family/Significant other contact made: Yes, individual(s) contacted:  [name] (mom) [phone]  Patient understands diagnosis: Yes. Discussing patient identified problems/goals with staff: Yes. Medical problems stabilized or resolved: Yes. Denies suicidal/homicidal ideation: Yes. Issues/concerns per patient self-inventory: No. Other: NONE  New problem(s) identified: No, Describe:  None  New Short Term/Long Term Goal(s):  medication stabilization, elimination of SI thoughts, development of comprehensive mental wellness plan.   Patient Goals:  "Get my medications regulated"  Discharge Plan or Barriers: Patient recently admitted. CSW will continue to follow and assess for appropriate referrals and possible discharge planning.  Reason for Continuation of Hospitalization: Depression Medication stabilization Other; describe Mood Stabilization  Estimated Length of Stay:  Last 3 [place] Suicide Severity Risk Score: Flowsheet Row [facility] ([name]17/2023 in [facility] ED from 05/15/2023 in [facility] Emergency Department at [facility]  C-SSRS RISK CATEGORY  No Risk No Risk No Risk       Last [facility] 2/9 Scores:    11/08/2022   10:00 AM 01/14/2022    1:09 PM 09/19/2021    1:54 PM  Depression screen PHQ 2/9  Decreased Interest 0 0 0  Down, Depressed, Hopeless 0 0 0  PHQ - 2 Score 0 0 0  Altered sleeping 0 0 0  Tired, decreased energy 0 0 0  Change in appetite 0 0 0  Feeling bad or failure about yourself  0 0 0  Trouble concentrating 0 0 0  Moving slowly or fidgety/restless 0 0 0  Suicidal thoughts 0 0 0  PHQ-9 Score 0 0 0  Difficult doing work/chores   Not difficult at all    Scribe for Treatment Team: Jacinta Shoe, LCSW 05/19/2023 3:54 PM

## 2023-05-19 NOTE — [facility] Group Notes (Signed)

## 2023-05-19 NOTE — Plan of Care (Signed)
  Problem: Education: Goal: Emotional status will improve Outcome: Progressing Goal: Mental status will improve Outcome: Progressing   Problem: Activity: Goal: Interest or engagement in activities will improve Outcome: Progressing   Problem: Education: Goal: Knowledge of [place] [facility] Education information/materials will improve Outcome: Progressing

## 2023-05-19 NOTE — Group Note (Signed)
Recreation Therapy Group Note   Group Topic:Stress Management  Group Date: 05/19/2023 Start Time: 0935 End Time: 1000 Facilitators: Jodelle Fausto-McCall, LRT,CTRS Location: 300 Hall Dayroom   Group Topic: Stress Management  Goal Area(s) Addresses:  Patient will identify positive stress management techniques. Patient will identify benefits of using stress management post d/c.  Intervention: Insight Timer App  Activity : Meditation. LRT engaged with patients about meditation and the benefits of it. LRT then played a meditation that focused on getting prepared for the day and being able to speak positive affirmations to themselves when negative thoughts start to creep in. Patients were encouraged to relax, get in a comfortable position, focus on their breathing and be attentive to the meditation.   Education:  Stress Management, Discharge Planning.   Education Outcome: Acknowledges Education   Affect/Mood: Appropriate   Participation Level: Active   Participation Quality: Independent   Behavior: Attentive    Speech/Thought Process: Focused   Insight: Good   Judgement: Good   Modes of Intervention: App   Patient Response to Interventions:  Attentive   Education Outcome:  In group clarification offered    Clinical Observations/Individualized Feedback: Pt attended and participated in group session. Pt was attentive and focused throughout group.     Plan: Continue to engage patient in RT group sessions 2-3x/week.   Valory Wetherby-McCall, LRT,CTRS 05/19/2023 11:56 AM

## 2023-05-19 NOTE — Progress Notes (Signed)
   05/18/23 2130  Psych Admission Type (Psych Patients Only)  Admission Status Involuntary  Psychosocial Assessment  Patient Complaints Anxiety;Irritability (Pt presented with irritable edge and pressured speech, "I'm anxious.  I can tell I'm off my meds.")  Eye Contact Fair;Intense  Facial Expression Anxious (Ranging to passive/relaxed)  Affect Irritable;Other (Comment);Anxious (ranging to passive/relaxed)  Speech Pressured;Logical/coherent (Irritable edge)  Interaction Assertive  Motor Activity Slow  Appearance/Hygiene Disheveled  Behavior Characteristics Irritable;Other (Comment) (Pt was irritable when going over meds initially d/t not usual bedtime medications on MAR. Pt was able to redirect self when staff informed him on call provider was notified.  Pt verbalized understanding that depakote was on hold d/t elevated VPA level.)  Mood Anxious;Apprehensive;Euthymic;Other (Comment) (Pt initially had an irritable edge with pressured speech and stated, "I need my regular meds." Pt was able to be cooperative while waiting for response from on call provider. Pt was polite and verbalized appreciation when bedtime meds resolved.)  Thought Process  Coherency WDL  Content WDL  Delusions None reported or observed  Perception WDL  Hallucination None reported or observed  Judgment Impaired  Confusion None  Danger to Self  Current suicidal ideation? Denies  Agreement Not to Harm Self Yes  Description of Agreement Verbal  Danger to Others  Danger to Others None reported or observed

## 2023-05-19 NOTE — [facility] Suicide Risk Assessment (Signed)
[facility] INPATIENT:  Family/Significant Other Suicide Prevention Education  Suicide Prevention Education:  Education Completed; Ronald [name],  (mother [phone]) has been identified by the patient as the family member/significant other with whom the patient will be residing, and identified as the person(s) who will aid the patient in the event of a mental health crisis (suicidal ideations/suicide attempt).  With written consent from the patient, the family member/significant other has been provided the following suicide prevention education, prior to the and/or following the discharge of the patient.  The suicide prevention education provided includes the following: Suicide risk factors Suicide prevention and interventions National Suicide Hotline telephone number [facility] assessment telephone number [facility] Emergency Assistance 911 [facility] and/or Residential Mobile Crisis Unit telephone number  Request made of family/significant other to: Remove weapons (e.g., guns, rifles, knives), all items previously/currently identified as safety concern.   Remove drugs/medications (over-the-counter, prescriptions, illicit drugs), all items previously/currently identified as a safety concern.  The family member/significant other verbalizes understanding of the suicide prevention education information provided.  The family member/significant other agrees to remove the items of safety concern listed above.  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  No guns or weapons in the home.  Mother able to assist with medication reminders.  Mother understands suicide prevention and will reach out for assistance in the event that she needs to.  Ronald [name] Ronald [name] 05/19/2023, 3:05 PM

## 2023-05-19 NOTE — BHH Counselor (Signed)
Adult Comprehensive Assessment  Patient ID: Ronald [name], male   DOB: 27-Jun-1980, 43 y.o.   MRN: [id number]  Information Source: Information source: Patient  Current Stressors:  Patient states their primary concerns and needs for treatment are:: "I have been off my meds" Patient states their goals for this hospitilization and ongoing recovery are:: "Get back on my meds" Educational / Learning stressors: None reported Employment / Job issues: None reported Family Relationships: "My mother has cancerEngineer, petroleum / Lack of resources (include bankruptcy): None reported Housing / Lack of housing: None reported Physical health (include injuries & life threatening diseases): None reported Social relationships: None reported Substance abuse: None reported Bereavement / Loss: None reported  Living/Environment/Situation:  Living Arrangements: Parent Living conditions (as described by patient or guardian): House Who else lives in the home?: Mother How long has patient lived in current situation?: 2001 What is atmosphere in current home: Loving, Supportive, Comfortable  Family History:  Marital status: Single Are you sexually active?: No What is your sexual orientation?: Heterosexual Has your sexual activity been affected by drugs, alcohol, medication, or emotional stress?: No Does patient have children?: No  Childhood History:  By whom was/is the patient raised?: Mother, Father Additional childhood history information: stable childhood until loss of brother, father, and friend (brother and friend were murdered) Description of patient's relationship with caregiver when they were a child: "My relationship with my parents was great" Patient's description of current relationship with people who raised him/her: "My mom and I's relationship is great" Father is deceased How were you disciplined when you got in trouble as a child/adolescent?: Spanked, grounded Does patient have siblings?:  Yes Number of Siblings: 1 Description of patient's current relationship with siblings: brother (deceased) Did patient suffer any verbal/emotional/physical/sexual abuse as a child?: No Did patient suffer from severe childhood neglect?: No Has patient ever been sexually abused/assaulted/raped as an adolescent or adult?: No Was the patient ever a victim of a crime or a disaster?: No Witnessed domestic violence?: No Has patient been affected by domestic violence as an adult?: No  Education:  Highest grade of school patient has completed: 1 year law school Currently a Consulting civil engineer?: No Learning disability?: No  Employment/Work Situation:   Employment Situation: Employed Where is Patient Currently Employed?: [employer] - declined How Long has Patient Been Employed?: [employer] - declined Are You Satisfied With Your Job?: Yes Do You Work More Than One Job?: No Work Stressors: [employer] Patient's Job has Been Impacted by Current Illness: No What is the Longest Time Patient has Held a Job?: 7 years 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?: No  Financial Resources:   Financial resources: Income from employment Does patient have a representative payee or guardian?: No  Alcohol/Substance Abuse:   What has been your use of drugs/alcohol within the last 12 months?: "None" If attempted suicide, did drugs/alcohol play a role in this?: No Alcohol/Substance Abuse Treatment Hx: Denies past history Has alcohol/substance abuse ever caused legal problems?: No  Social Support System:   Patient's Community Support System: Good Describe Community Support System: "Yes, next question" Type of faith/religion: None reported How does patient's faith help to cope with current illness?: None reported  Leisure/Recreation:   Do You Have Hobbies?: Yes Leisure and Hobbies: Painting, art, reading, working out, outdoor activities  Strengths/Needs:   What is the  patient's perception of their strengths?: None reported Patient states they can use these personal strengths during their  treatment to contribute to their recovery: None reported Patient states these barriers may affect/interfere with their treatment: None reported Patient states these barriers may affect their return to the community: None reported Other important information patient would like considered in planning for their treatment: None reported  Discharge Plan:   Currently receiving community mental health services: No Patient states concerns and preferences for aftercare planning are: See's his PCP - will not tell who Patient states they will know when they are safe and ready for discharge when: "Meds are fixed" Does patient have access to transportation?: Yes Does patient have financial barriers related to discharge medications?: No Will patient be returning to same living situation after discharge?: Yes  Summary/Recommendations:   Summary and Recommendations (to be completed by the evaluator): Ronald [name] is a 43 year old male who is involuntarily admitted to [facility] secondary to [facility] due to needing medication management and paranoia. Pt reports the only stress he is experiencing is PTSD and recently finding out that his mother was diagnosed with lung cancer. Pt reports they live together and have a good relationship. Pt denied experiencing grief as a stressor however it is important to note that his brother and friend were murdered and his father passed away. Pt was labile during assessment, gave minimal answers and denied to answer some questions stating "next." Pt denies substance use, AVH/SI and HI. Pt stated "I would like to go home this evening, my mother will come to pick me up." Pt refused outside providers for therapy and medication management, reporting that "I have that already through my own Doctor." Pt refused to elaborate on which provider he sees. While here, Ronald [name] can benefit  from crisis stabilization, medication management, therapeutic milieu, and referrals for services.   Kathi Der. 05/19/2023

## 2023-05-20 ENCOUNTER — Inpatient Hospital Stay (HOSPITAL_COMMUNITY)
Admission: EM | Admit: 2023-05-20 | Payer: No Typology Code available for payment source | Source: Intra-hospital | Admitting: Psychiatry

## 2023-05-20 DIAGNOSIS — F250 Schizoaffective disorder, bipolar type: Secondary | ICD-10-CM | POA: Diagnosis not present

## 2023-05-20 LAB — I-STAT CG4 LACTIC ACID, ED: Lactic Acid, Venous: 1.2 mmol/L (ref 0.5–1.9)

## 2023-05-20 LAB — VALPROIC ACID LEVEL: Valproic Acid Lvl: <10 ug/mL — ABNORMAL LOW (ref 50.0–100.0)

## 2023-05-20 LAB — AMMONIA: Ammonia: 22 umol/L (ref 9–35)

## 2023-05-20 MED ORDER — LORAZEPAM 2 MG/ML IJ SOLN
1.0000 mg | Freq: Once | INTRAMUSCULAR | Status: AC
  Administered 2023-05-20: 1 mg via INTRAVENOUS
  Filled 2023-05-20: qty 1

## 2023-05-20 NOTE — Plan of Care (Signed)
  Problem: Coping: Goal: Ability to verbalize frustrations and anger appropriately will improve Outcome: Progressing Goal: Ability to demonstrate self-control will improve Outcome: Progressing   Problem: Health Behavior/Discharge Planning: Goal: Identification of resources available to assist in meeting health care needs will improve Outcome: Progressing Goal: Compliance with treatment plan for underlying cause of condition will improve Outcome: Progressing

## 2023-05-20 NOTE — Progress Notes (Signed)
 [facility] MD Progress Note  05/20/2023 2:44 PM Ronald [name]  MRN:  [id number]  Principal Problem: Schizoaffective disorder, bipolar type (HCC) Diagnosis: Principal Problem:   Schizoaffective disorder, bipolar type (HCC) Active Problems:   GAD (generalized anxiety disorder)   Cannabis use disorder  HPI: Patient is a 43 year old Caucasian male with prior mental health diagnoses of anxiety, cannabis use disorder, PTSD & Benzodiazepine abuse, who [facility] the [facility]) on 2/3 via law enforcement after his mother called for assistance for worsening aggressive behaviors & paranoia at home as per information obtained from his mother by provider at [facility]. As per documentation from the [facility]:   He has begun talking to himself in response to hearing voices.  He reports they are faint whispers.  Accoreding to mother, the patient appears to be paranoid, as he is fixated with the TV remote and refuses to leave home.  He also will refuse to open the door to the neighbors who come to check on them.  [name], Lianne JINNY HUGHS, Date of Service: 05/17/2023 10:16 AM)   24 hr chart Review:  V/S are WNL. Pt was transferred to the ER overnight where he was given IV fluids & Ativan  for an elevated lactic acid level prior to being sent back to [facility]. The medicine team deemed patient medically stable, and recommended follow up as needed outpatient. PRN medications for the past 24 hrs have consisted of Trazodone  last night & Zyprexa  yesterday afternoon for agitation.  Patient assessment note:  During encounter today, patient reports being remorseful for his behaviors yesterday, he talks about finding out that his mother has cancer, states that his mother concealed this information from him, reports stopping his medications when he found out that his mother has this diagnosis.  He reports that he loves his mother a lot, and cannot afford missing her just like he lost his brother in the  past.  He presents with a depressed mood, but denies SI today, denies HI, denies AVH, denies paranoia, there is no evidence of delusional thinking.  Patient reports that he is tolerating taking medications, denies medication related side effects at this time & we are continuing to monitor responses to medications, and will be making adjustments as needed. No TD/EPS type symptoms found on assessment, and pt denies any feelings of stiffness. AIMS: 0. We will continue medications as listed below and will continue to monitor responses.  If symptoms continue to stabilize, we will hopefully discharge patient by the end of this week, pending discharge safety planning and outpatient follow-up appointments.  Total Time spent with patient: 45 minutes  Past Psychiatric History: See H & P  Past Medical History:  Past Medical History:  Diagnosis Date   Anxiety    Arthritis    Disorder of pineal gland    Post traumatic stress disorder     Past Surgical History:  Procedure Laterality Date   ANKLE SURGERY     Family History: History reviewed. No pertinent family history. Family Psychiatric  History: See H & P Social History:  Social History   Substance and Sexual Activity  Alcohol Use No     Social History   Substance and Sexual Activity  Drug Use Yes   Types: Marijuana   Comment: one year ago    Social History   Socioeconomic History   Marital status: Single    Spouse name: Not on file   Number of children: Not on file  Years of education: Not on file   Highest education level: Not on file  Occupational History   Not on file  Tobacco Use   Smoking status: Never   Smokeless tobacco: Current    Types: Chew  Vaping Use   Vaping status: Never Used  Substance and Sexual Activity   Alcohol use: No   Drug use: Yes    Types: Marijuana    Comment: one year ago   Sexual activity: Not Currently  Other Topics Concern   Not on file  Social History Narrative   Not on file   Social  Drivers of Health   Financial Resource Strain: Not on file  Food Insecurity: No Food Insecurity (05/20/2023)   Hunger Vital Sign    Worried About Running Out of Food in the Last Year: Never true    Ran Out of Food in the Last Year: Never true  Transportation Needs: No Transportation Needs (05/20/2023)   PRAPARE - Administrator, Civil Service (Medical): No    Lack of Transportation (Non-Medical): No  Physical Activity: Not on file  Stress: Not on file  Social Connections: Not on file   Sleep: Good  Appetite:  Fair  Current Medications: Current Facility-Administered Medications  Medication Dose Route Frequency Provider Last Rate Last Admin   acetaminophen  (TYLENOL ) tablet 650 mg  650 mg Oral Q6H PRN Coleman, Carolyn H, NP   650 mg at 05/19/23 2116   alum & mag hydroxide-simeth (MAALOX/MYLANTA) 200-200-20 MG/5ML suspension 30 mL  30 mL Oral Q4H PRN Coleman, Carolyn H, NP       feeding supplement (ENSURE ENLIVE / ENSURE PLUS) liquid 237 mL  237 mL Oral BID BM Massengill, Rankin, MD   237 mL at 05/19/23 1401   magnesium  hydroxide (MILK OF MAGNESIA) suspension 30 mL  30 mL Oral Daily PRN Mardy Elveria DEL, NP       melatonin tablet 10 mg  10 mg Oral QHS [name], [name], NP   10 mg at 05/19/23 2116   multivitamin with minerals tablet 1 tablet  1 tablet Oral Q breakfast [name], [name], NP   1 tablet at 05/20/23 0815   nicotine  (NICODERM CQ  - dosed in mg/24 hours) patch 14 mg  14 mg Transdermal Daily Coleman, Carolyn H, NP   14 mg at 05/20/23 0815   nicotine  polacrilex (NICORETTE ) gum 2 mg  2 mg Oral Q4H PRN [name], [name], NP   2 mg at 05/20/23 1129   OLANZapine  (ZYPREXA ) injection 10 mg  10 mg Intramuscular TID PRN Mardy Elveria DEL, NP       OLANZapine  (ZYPREXA ) injection 5 mg  5 mg Intramuscular TID PRN Mardy Elveria DEL, NP       OLANZapine  (ZYPREXA ) tablet 10 mg  10 mg Oral QHS Coleman, Carolyn H, NP   10 mg at 05/19/23 2116   OLANZapine  (ZYPREXA ) tablet 5 mg  5 mg  Oral Daily Coleman, Carolyn H, NP   5 mg at 05/20/23 0815   OLANZapine  zydis (ZYPREXA ) disintegrating tablet 5 mg  5 mg Oral TID PRN Coleman, Carolyn H, NP   5 mg at 05/19/23 1435   traZODone  (DESYREL ) tablet 50 mg  50 mg Oral QHS PRN Coleman, Carolyn H, NP   50 mg at 05/19/23 2116    Lab Results:  Results for orders placed or performed during the hospital encounter of 05/18/23 (from the past 48 hours)  Valproic  acid level     Status: Abnormal  Collection Time: 05/19/23  6:18 AM  Result Value Ref Range   Valproic  Acid Lvl 102 (H) 50.0 - 100.0 ug/mL    Comment: RESULT CONFIRMED BY MANUAL DILUTION Performed at [facility], [address], [place], [name] [address]   Lactic acid, plasma     Status: Abnormal   Collection Time: 05/19/23  7:01 PM  Result Value Ref Range   Lactic Acid, Venous 2.2 (HH) 0.5 - 1.9 mmol/L    Comment: CRITICAL RESULT CALLED TO, READ BACK BY AND VERIFIED WITH C. PORTERFIELD, RN 05/18/22 2037 BY K. DAVIS Performed at [facility], [address], [place], [name] [address]   I-Stat CG4 Lactic Acid, ED     Status: None   Collection Time: 05/20/23  1:36 AM  Result Value Ref Range   Lactic Acid, Venous 1.2 0.5 - 1.9 mmol/L    Blood Alcohol level:  Lab Results  Component Value Date   ETH <10 05/17/2023   ETH <10 05/15/2023    Metabolic Disorder Labs: Lab Results  Component Value Date   HGBA1C 4.9 05/17/2023   MPG 93.93 05/17/2023   MPG 93.93 09/29/2021   No results found for: PROLACTIN Lab Results  Component Value Date   CHOL 152 05/17/2023   TRIG 70 05/17/2023   HDL 78 05/17/2023   CHOLHDL 1.9 05/17/2023   VLDL 14 05/17/2023   LDLCALC 60 05/17/2023   LDLCALC 55 02/19/2020    Physical Findings: AIMS:  , ,  ,  ,    CIWA:    COWS:     Musculoskeletal: Strength & Muscle Tone: within normal limits Gait & Station: normal Patient leans: N/A  Psychiatric Specialty Exam:  Presentation  General  Appearance:  Fairly Groomed  Eye Contact: Fair  Speech: Clear and Coherent  Speech Volume: Normal  Handedness: Right   Mood and Affect  Mood: Depressed; Anxious  Affect: Congruent   Thought Process  Thought Processes: Coherent  Descriptions of Associations:Intact  Orientation:Full (Time, Place and Person)  Thought Content:Logical  History of Schizophrenia/Schizoaffective disorder:Yes  Duration of Psychotic Symptoms:Greater than six months  Hallucinations:Hallucinations: None  Ideas of Reference:None  Suicidal Thoughts:Suicidal Thoughts: No  Homicidal Thoughts:Homicidal Thoughts: No   Sensorium  Memory: Immediate Fair  Judgment: Fair  Insight: Fair   Art Therapist  Concentration: Fair  Attention Span: Fair  Recall: [employer] of Knowledge: Fair  Language: Fair   Psychomotor Activity  Psychomotor Activity: Psychomotor Activity: Normal   Assets  Assets: Resilience   Sleep  Sleep: Sleep: Good    Physical Exam: Physical Exam Vitals and nursing note reviewed.  Constitutional:      Appearance: Normal appearance.  Musculoskeletal:        General: Normal range of motion.     Cervical back: Normal range of motion.  Neurological:     Mental Status: He is alert.    Review of Systems  Psychiatric/Behavioral:  Positive for depression and substance abuse. Negative for hallucinations, memory loss and suicidal ideas. The patient is nervous/anxious and has insomnia.   All other systems reviewed and are negative.  Blood pressure 130/88, pulse 76, temperature 98.6 F (37 C), temperature source Oral, resp. rate 18, height 5' 11 (1.803 m), weight 69.3 kg, SpO2 99%. Body mass index is 21.31 kg/m.  Treatment Plan Summary: Daily contact with patient to assess and evaluate symptoms and progress in treatment and Medication management   Safety and Monitoring: Voluntary admission to inpatient psychiatric unit for safety,  stabilization and treatment Daily contact with patient to assess and evaluate symptoms and progress in treatment Patient's case to be discussed in multi-disciplinary team meeting Observation Level : q15 minute checks Vital signs: q12 hours Precautions: Safety   Long Term Goal(s): Improvement in symptoms so as ready for discharge   Short Term Goals: Ability to identify changes in lifestyle to reduce recurrence of condition will improve, Ability to verbalize feelings will improve, Ability to disclose and discuss suicidal ideas, Ability to demonstrate self-control will improve, Ability to identify and develop effective coping behaviors will improve, Ability to maintain clinical measurements within normal limits will improve, Compliance with prescribed medications will improve, and Ability to identify triggers associated with substance abuse/mental health issues will improve   Diagnoses Principal Problem:   Schizoaffective disorder, bipolar type (HCC) Active Problems:   GAD (generalized anxiety disorder)   Cannabis use disorder   Medications -Continue Zyprexa  5 mg in the mornings for psychosis -Continue Zyprexa  10 mg nightly for psychosis and mood stabilization -Continue nicotine  patch 14 mg daily for nicotine  dependence -Continue melatonin 10 mg nightly for sleep -Continue Ensure twice daily in between meals for nutritional supplementation -Continue multivitamins daily for nutritional supplementation   PRNS -Continue agitation protocol medications as per the [facility] -Continue Nicorette  gum as needed every 4 hours for nicotine  dependence -Continue trazodone  50 mg nightly as needed for sleep -Continue Tylenol  650 mg every 6 hours PRN for mild pain -Continue Maalox 30 mg every 4 hrs PRN for indigestion -Continue Milk of Magnesia as needed every 6 hrs for constipation   Labs Review: Valproic  acid level 146 2 days ago, repeated (2/3) and was 102. Ammonia elevated at 64 2 days ago, repeated and  was 50.  CBC, CMP, lipid panel, hemoglobin A1c review.  Toxicology positive for THC.   Discharge Planning: Social work and case management to assist with discharge planning and identification of hospital follow-up needs prior to discharge Estimated LOS: 5-7 days Discharge Concerns: Need to establish a safety plan; Medication compliance and effectiveness Discharge Goals: Return home with outpatient referrals for mental health follow-up including medication management/psychotherapy   I certify that inpatient services furnished can reasonably be expected to improve the patient's condition.    Donia Snell, NP 05/20/2023, 2:44 PM

## 2023-05-20 NOTE — Progress Notes (Signed)
 2035- Katie D. In lab called critical in to RN , Lactic Acid 2.2. 2040 Patric Ivans NP notified of critical lab via secure chat  and order to transfer patient to [facility] received, Dr. Mannie receiving doctor. 2110 -Report called to [facility] RN at [facility] 2245- EMS transport arranged by [facility] arrived, report given to EMT. Patient transported to [facility] via stretcher accompanied by EMS personnel and [facility] MHT. 0230- Report receive from [facility] RN: Patient received 500ml of fluid , at 0124 Ativan  mg IV given for agitation, labs drawn at 0136, Lactic Acid 1.2. 9586- Patient returned from [facility] accompanied by MHT. Patient ambulatory, alert and oriented, no apparent signs of distress expressed or observed. BP 130/88, HR 76, RR 18, T 98.6, O2sat 99%. Fluids offered and accepted, patient returned to his room. Will continue with plan of care and monitor and assess patient.

## 2023-05-20 NOTE — Discharge Instructions (Addendum)
-  Follow-up with your outpatient psychiatric provider -instructions on appointment date, time, and address (location) are provided to you in discharge paperwork. Your depakote  level on 05-27-23 was: 46  -Take your psychiatric medications as prescribed at discharge - instructions are provided to you in the discharge paperwork  -Follow-up with outpatient primary care doctor and other specialists -for management of preventative medicine and any chronic medical disease.  -Recommend abstinence from alcohol, tobacco, and other illicit drug use at discharge.   -If your psychiatric symptoms recur, worsen, or if you have side effects to your psychiatric medications, call your outpatient psychiatric provider, 911, [phone] or go to the nearest emergency department.  -If suicidal thoughts occur, call your outpatient psychiatric provider, 911, 988 or go to the nearest emergency department.  Naloxone (Narcan) can help reverse an overdose when given to the victim quickly.  [facility] offers free naloxone kits and instructions/training on its use.  Add naloxone to your first aid kit and you can help save a life.   Pick up your free kit at the following locations:   [place]:  [facility], [address] [name] [address] [phone]) [facility] [address] [name] [address] [phone]) [facility] [address] [name] [address]  [place]: [facility] [address] [place] [address] ([phone]) [facility] [address] [name] [address] [phone])

## 2023-05-20 NOTE — Group Note (Signed)
 LCSW Group Therapy Note   Group Date: 05/19/2023 Start Time: 1300 End Time: 1400   Participation:  patient was present and actively participated in the conversation, engaging with the group and contributing thoughtful insights.  He shared personal experiences.    Type of Therapy:  Group Therapy   Topic:  Stronger Together: Improving Interpersonal Skills for Better Relationships  Objective: To enhance interpersonal skills by improving communication, setting healthy boundaries, and fostering positive relationships.  Goals: Identify healthy and unhealthy relationship qualities. Practice active listening and assertive communication. Develop an action plan for setting boundaries and improving relationships.  Summary: In this class, participants explored key elements of building strong, healthy relationships. They learned about different communication styles, practiced active listening, and discussed the importance of setting boundaries. The group created actionable steps to improve their interpersonal skills and relationships.  Therapeutic Modalities Used: Elements of Cognitive Behavioral Therapy (reframing and thought restructuring, problem solving) Elements of DBT (mindfulness)   Ronald [name], LCSWA 05/20/2023  6:28 PM

## 2023-05-20 NOTE — Progress Notes (Signed)
   05/20/23 0800  Psych Admission Type (Psych Patients Only)  Admission Status Involuntary  Psychosocial Assessment  Patient Complaints Anxiety  Eye Contact Fair  Facial Expression Pensive  Affect Blunted  Speech Logical/coherent  Interaction Assertive  Motor Activity Slow  Appearance/Hygiene In scrubs  Behavior Characteristics Cooperative  Mood Depressed  Thought Process  Coherency WDL  Content WDL  Delusions None reported or observed  Perception WDL  Hallucination None reported or observed  Judgment Poor  Confusion None  Danger to Self  Current suicidal ideation? Denies  Agreement Not to Harm Self Yes  Description of Agreement Verbal  Danger to Others  Danger to Others None reported or observed

## 2023-05-20 NOTE — ED Provider Notes (Incomplete)
[place] EMERGENCY DEPARTMENT AT [facility] Provider Note   CSN: [id number] Arrival date & time: 05/19/23  2258     History {Add pertinent medical, surgical, social history, OB history to HPI:1} No chief complaint on file.   BASEM YANNUZZI is a 43 y.o. male.  43 year old male presents to the emergency department in transfer from [facility] where he is currently being managed for aggressive behaviors & paranoia. He was transferred to our ED for a lactic acid level of 2.2. It is unclear what the motivation was for ordering this test. The patient currently is asymptomatic. Reports some increased anxiety, but he has been dealing with this since his inpatient behavioral hospitalization. States he had an isolated episode of emesis x 1 earlier today, but it may have been provoked by something he ate. He currently has no nausea or abdominal pain. Denies fevers or recent illness.   The history is provided by the patient. No language interpreter was used.       Home Medications Prior to Admission medications   Medication Sig Start Date End Date Taking? Authorizing Provider  Collagen-Vitamin C-Biotin (COLLAGEN PO) Take 1 capsule by mouth daily. Patient not taking: Reported on 05/17/2023    [provider]  divalproex (DEPAKOTE) 250 MG DR tablet Take 3 tablets (750 mg total) by mouth 2 (two) times daily. Patient not taking: Reported on 05/17/2023 05/15/23   Kommor, Wyn Forster, MD  LORazepam (ATIVAN) 1 MG tablet Take 1 tablet (1 mg total) by mouth 3 (three) times daily as needed for anxiety. Patient not taking: Reported on 05/17/2023 05/15/23   Kommor, Wyn Forster, MD  Melatonin 10 MG TABS Take 10 mg by mouth at bedtime. Patient not taking: Reported on 05/17/2023 05/15/23   Kommor, Wyn Forster, MD  Multiple Vitamins-Minerals (MENS MULTIVITAMIN) TABS Take 1 tablet by mouth daily with breakfast. Patient not taking: Reported on 05/17/2023 05/15/23   Kommor, Wyn Forster, MD  OLANZapine (ZYPREXA) 10 MG tablet  Take 1 tablet (10 mg total) by mouth at bedtime. 05/18/23   Ardis Hughs, NP  OLANZapine (ZYPREXA) 5 MG tablet Take 1 tablet (5 mg total) by mouth daily. 05/19/23   Ardis Hughs, NP  traZODone (DESYREL) 50 MG tablet Take 1 tablet (50 mg total) by mouth at bedtime. Patient not taking: Reported on 05/17/2023 05/15/23 11/11/23  Kommor, Wyn Forster, MD      Allergies    Hydroxyzine, Alupent [metaproterenol], Benadryl [diphenhydramine], Klonopin [clonazepam], and Other    Review of Systems   Review of Systems  Physical Exam Updated Vital Signs BP (!) 111/97 (BP Location: Right Arm)   Pulse 79   Temp 99 F (37.2 C)   Resp 20   Ht 5\' 11"  (1.803 m)   Wt 69.3 kg   SpO2 98%   BMI 21.31 kg/m  Physical Exam  ED Results / Procedures / Treatments   Labs (all labs ordered are listed, but only abnormal results are displayed) Labs Reviewed  LACTIC ACID, PLASMA - Abnormal; Notable for the following components:      Result Value   Lactic Acid, Venous 2.2 (*)    All other components within normal limits  VALPROIC ACID LEVEL - Abnormal; Notable for the following components:   Valproic Acid Lvl 102 (*)    All other components within normal limits  AMMONIA  VALPROIC ACID LEVEL    EKG None  Radiology No results found.  Procedures Procedures  {Document cardiac monitor, telemetry assessment procedure when appropriate:1}  Medications Ordered in  ED Medications  acetaminophen (TYLENOL) tablet 650 mg (650 mg Oral Given 05/19/23 2116)  alum & mag hydroxide-simeth (MAALOX/MYLANTA) 200-200-20 MG/5ML suspension 30 mL (has no administration in time range)  magnesium hydroxide (MILK OF MAGNESIA) suspension 30 mL (has no administration in time range)  nicotine (NICODERM CQ - dosed in mg/24 hours) patch 14 mg (14 mg Transdermal Patch Applied 05/19/23 0800)  OLANZapine (ZYPREXA) tablet 10 mg (10 mg Oral Given 05/19/23 2116)  OLANZapine (ZYPREXA) tablet 5 mg (5 mg Oral Given 05/19/23 0800)  traZODone  (DESYREL) tablet 50 mg (50 mg Oral Given 05/19/23 2116)  OLANZapine zydis (ZYPREXA) disintegrating tablet 5 mg (5 mg Oral Given 05/19/23 1435)  OLANZapine (ZYPREXA) injection 5 mg (has no administration in time range)  OLANZapine (ZYPREXA) injection 10 mg (has no administration in time range)  feeding supplement (ENSURE ENLIVE / ENSURE PLUS) liquid 237 mL (237 mLs Oral Given 05/19/23 1401)  melatonin tablet 10 mg (10 mg Oral Given 05/19/23 2116)  multivitamin with minerals tablet 1 tablet (1 tablet Oral Given 05/19/23 0759)  nicotine polacrilex (NICORETTE) gum 2 mg (2 mg Oral Given 05/19/23 1913)  sodium chloride 0.9 % bolus 500 mL (has no administration in time range)  LORazepam (ATIVAN) tablet 1 mg (1 mg Oral Given 05/18/23 2149)    ED Course/ Medical Decision Making/ A&P   {   Click here for ABCD2, HEART and other calculatorsREFRESH Note before signing :1}                              Medical Decision Making  ***  {Document critical care time when appropriate:1} {Document review of labs and clinical decision tools ie heart score, Chads2Vasc2 etc:1}  {Document your independent review of radiology images, and any outside records:1} {Document your discussion with family members, caretakers, and with consultants:1} {Document social determinants of health affecting pt's care:1} {Document your decision making why or why not admission, treatments were needed:1} Final Clinical Impression(s) / ED Diagnoses Final diagnoses:  None    Rx / DC Orders ED Discharge Orders     None

## 2023-05-20 NOTE — Group Note (Signed)
 Date:  05/20/2023 Time:  9:13 PM  Group Topic/Focus:  Wrap-Up Group:   The focus of this group is to help patients review their daily goal of treatment and discuss progress on daily workbooks.    Participation Level:  Active  Participation Quality:  Appropriate and Attentive  Affect:  Appropriate  Cognitive:  Alert and Appropriate  Insight: Appropriate and Good  Engagement in Group:  Engaged  Modes of Intervention:  Discussion and Education  Additional Comments:  Pt attended and participated in wrap up group this evening and rated their day a 10/10. Pt stated that their medications are regulating well and they feel that they are ready for D/C. Pt is looking forward to their mother visiting them tomorrow during their specialized visit.   Ronald [name] 05/20/2023, 9:13 PM

## 2023-05-20 NOTE — ED Provider Notes (Signed)
 [place] EMERGENCY DEPARTMENT AT [facility] Provider Note   CSN: [id number] Arrival date & time: 05/19/23  2258     History  No chief complaint on file.   Ronald [name] is a 43 y.o. male.  43 year old male presents to the emergency department in transfer from [facility] where he is currently being managed for aggressive behaviors & paranoia. He was transferred to our ED for a lactic acid level of 2.2. It is unclear what the motivation was for ordering this test. The patient currently is asymptomatic. Reports some increased anxiety, but he has been dealing with this since his inpatient behavioral hospitalization. States he had an isolated episode of emesis x 1 earlier today, but it may have been provoked by something he ate. He currently has no nausea or abdominal pain. Denies fevers or recent illness.   The history is provided by the patient. No language interpreter was used.       Home Medications Prior to Admission medications   Medication Sig Start Date End Date Taking? Authorizing Provider  Collagen-Vitamin C-Biotin (COLLAGEN PO) Take 1 capsule by mouth daily. Patient not taking: Reported on 05/17/2023    [provider]  divalproex  (DEPAKOTE ) 250 MG DR tablet Take 3 tablets (750 mg total) by mouth 2 (two) times daily. Patient not taking: Reported on 05/17/2023 05/15/23   Kommor, Lum, MD  LORazepam  (ATIVAN ) 1 MG tablet Take 1 tablet (1 mg total) by mouth 3 (three) times daily as needed for anxiety. Patient not taking: Reported on 05/17/2023 05/15/23   Kommor, Lum, MD  Melatonin 10 MG TABS Take 10 mg by mouth at bedtime. Patient not taking: Reported on 05/17/2023 05/15/23   Kommor, Lum, MD  Multiple Vitamins-Minerals (MENS MULTIVITAMIN) TABS Take 1 tablet by mouth daily with breakfast. Patient not taking: Reported on 05/17/2023 05/15/23   Kommor, Lum, MD  OLANZapine  (ZYPREXA ) 10 MG tablet Take 1 tablet (10 mg total) by mouth at bedtime. 05/18/23   Mardy Elveria DEL, NP  OLANZapine  (ZYPREXA ) 5 MG tablet Take 1 tablet (5 mg total) by mouth daily. 05/19/23   Mardy Elveria DEL, NP  traZODone  (DESYREL ) 50 MG tablet Take 1 tablet (50 mg total) by mouth at bedtime. Patient not taking: Reported on 05/17/2023 05/15/23 11/11/23  Kommor, Lum, MD      Allergies    Hydroxyzine , Alupent [metaproterenol], Benadryl  [diphenhydramine ], Klonopin  [clonazepam ], and Other    Review of Systems   Review of Systems Ten systems reviewed and are negative for acute change, except as noted in the HPI.    Physical Exam Updated Vital Signs BP (!) 111/97 (BP Location: Right Arm)   Pulse 79   Temp 99 F (37.2 C)   Resp 20   Ht 5' 11 (1.803 m)   Wt 69.3 kg   SpO2 98%   BMI 21.31 kg/m   Physical Exam Vitals and nursing note reviewed.  Constitutional:      General: He is not in acute distress.    Appearance: He is well-developed. He is not diaphoretic.     Comments: Nontoxic and in NAD  HENT:     Head: Normocephalic and atraumatic.  Eyes:     General: No scleral icterus.    Conjunctiva/sclera: Conjunctivae normal.  Cardiovascular:     Rate and Rhythm: Normal rate and regular rhythm.     Pulses: Normal pulses.  Pulmonary:     Effort: Pulmonary effort is normal. No respiratory distress.     Comments: Respirations even  and unlabored Musculoskeletal:        General: Normal range of motion.     Cervical back: Normal range of motion.  Skin:    General: Skin is warm and dry.     Coloration: Skin is not pale.     Findings: No erythema or rash.  Neurological:     Mental Status: He is alert and oriented to person, place, and time.  Psychiatric:        Behavior: Behavior normal.     Comments: Good eye contact. Cooperative. Mildly anxious appearing.     ED Results / Procedures / Treatments   Labs (all labs ordered are listed, but only abnormal results are displayed) Labs Reviewed  LACTIC ACID, PLASMA - Abnormal; Notable for the following components:       Result Value   Lactic Acid, Venous 2.2 (*)    All other components within normal limits  VALPROIC  ACID LEVEL - Abnormal; Notable for the following components:   Valproic  Acid Lvl 102 (*)    All other components within normal limits  AMMONIA  VALPROIC  ACID LEVEL  LACTIC ACID, PLASMA  I-STAT CG4 LACTIC ACID, ED    EKG None  Radiology No results found.  Procedures Procedures    Medications Ordered in ED Medications  acetaminophen  (TYLENOL ) tablet 650 mg (650 mg Oral Given 05/19/23 2116)  alum & mag hydroxide-simeth (MAALOX/MYLANTA) 200-200-20 MG/5ML suspension 30 mL (has no administration in time range)  magnesium  hydroxide (MILK OF MAGNESIA) suspension 30 mL (has no administration in time range)  nicotine  (NICODERM CQ  - dosed in mg/24 hours) patch 14 mg (14 mg Transdermal Patch Applied 05/19/23 0800)  OLANZapine  (ZYPREXA ) tablet 10 mg (10 mg Oral Given 05/19/23 2116)  OLANZapine  (ZYPREXA ) tablet 5 mg (5 mg Oral Given 05/19/23 0800)  traZODone  (DESYREL ) tablet 50 mg (50 mg Oral Given 05/19/23 2116)  OLANZapine  zydis (ZYPREXA ) disintegrating tablet 5 mg (5 mg Oral Given 05/19/23 1435)  OLANZapine  (ZYPREXA ) injection 5 mg (has no administration in time range)  OLANZapine  (ZYPREXA ) injection 10 mg (has no administration in time range)  feeding supplement (ENSURE ENLIVE / ENSURE PLUS) liquid 237 mL (237 mLs Oral Given 05/19/23 1401)  melatonin tablet 10 mg (10 mg Oral Given 05/19/23 2116)  multivitamin with minerals tablet 1 tablet (1 tablet Oral Given 05/19/23 0759)  nicotine  polacrilex (NICORETTE ) gum 2 mg (2 mg Oral Given 05/19/23 1913)  LORazepam  (ATIVAN ) tablet 1 mg (1 mg Oral Given 05/18/23 2149)  sodium chloride  0.9 % bolus 500 mL (0 mLs Intravenous Stopped 05/20/23 0124)  LORazepam  (ATIVAN ) injection 1 mg (1 mg Intravenous Given 05/20/23 0124)    ED Course/ Medical Decision Making/ A&P Clinical Course as of 05/20/23 0233  Tue May 20, 2023  0006 Patient presenting for evaluation of lactic acid  level of 2.2 which was ordered while patient was under care at [facility]. It is unclear why this test was initially ordered. Attempting to get clarification from provider who transferred patient, but have yet to hear back.  Currently the patient is asymptomatic. He has no complaints, no pain. He had an isolated of emesis yesterday, but no persistent or ongoing vomiting. Certainly may be abnormal from dehydration should he be having poor oral intake. However, CK from 2 days ago was normal. No concern for rhabdomyolysis. I am not concerned about sepsis in this patient; no SIRS criteria or signs of active infection. Also do not feel this elevation represents an acute ischemic event.   Plan for hydration  and repeat of lactate level. If improving, anticipate discharge and transfer back to [facility]. [KH]  0157 Repeat lactic acid level is 1.2. VSS. No indication for further emergent work up at this time. Stable for transfer back to [facility]. [KH]    Clinical Course User Index [KH] Keith Sor, PA-C                                 Medical Decision Making Amount and/or Complexity of Data Reviewed Labs: ordered.  Risk Prescription drug management.   This patient presents to the ED for concern of lab abnormality, this involves an extensive number of treatment options, and is a complaint that carries with it a high risk of complications and morbidity.  The differential diagnosis includes sepsis vs shock vs acute ischemia vs dehydration vs lab error   Co morbidities that complicate the patient evaluation  Anxiety PTSD Marijuana use   Additional history obtained:  Additional history obtained from EMS personnel External records from outside source obtained and reviewed including CK level from 2 days ago which was normal.   Lab Tests:  I Ordered, and personally interpreted labs.  The pertinent results include:  Repeat lactic 1.2.   Cardiac Monitoring:  The patient was maintained on a cardiac monitor.  I  personally viewed and interpreted the cardiac monitored which showed an underlying rhythm of: NSR   Medicines ordered and prescription drug management:  I ordered medication including IVF for hydration and Ativan  for anxiety Reevaluation of the patient after these medicines showed that the patient improved I have reviewed the patients home medicines and have made adjustments as needed   Test Considered:  CBC - felt low yield given absence of SIRS criteria   Problem List / ED Course:  As above   Reevaluation:  After the interventions noted above, I reevaluated the patient and found that they have : remained stable   Social Determinants of Health:  Under involuntary commitment   Dispostion:  After consideration of the diagnostic results and the patients response to treatment, I feel that the patent would benefit from ongoing outpatient f/u PRN. Stable for transport back to [facility] for ongoing psychiatric care.          Final Clinical Impression(s) / ED Diagnoses Final diagnoses:  Elevated lactic acid level    Rx / DC Orders ED Discharge Orders     None         Keith Sor, PA-C 05/20/23 0236    Raford Lenis, MD 05/20/23 [phone]

## 2023-05-20 NOTE — Progress Notes (Signed)
Informed patient that his earlier lab draw resulted with a value that was out of range and he would need to be sent to [facility] for additional testing and evaluation. Questions asked and answered, patient verbalized understanding. Stated he wanted to call his mother, patient was reassured that he could. Patient instructed to put warmer clothing for transport and complied.

## 2023-05-20 NOTE — ED Notes (Signed)
Pt off unit with [employer] for transport back to [facility]

## 2023-05-20 NOTE — Group Note (Signed)
 Recreation Therapy Group Note   Group Topic:Animal Assisted Therapy   Group Date: 05/20/2023 Start Time: 9049 End Time: 1030 Facilitators: Skipper Dacosta-McCall, LRT,CTRS Location: [address] Dayroom   Animal-Assisted Activity (AAA) Program Checklist/Progress Notes Patient Eligibility Criteria Checklist & Daily Group note for Rec Tx Intervention  AAA/T Program Assumption of Risk Form signed by Patient/ or Parent Legal Guardian Yes  Patient is free of allergies or severe asthma Yes  Patient reports no fear of animals Yes  Patient reports no history of cruelty to animals Yes  Patient understands his/her participation is voluntary Yes  Patient washes hands before animal contact Yes  Patient washes hands after animal contact Yes  Education: Hand Washing, Appropriate Animal Interaction   Education Outcome: Acknowledges education.    Affect/Mood: Flat   Participation Level: Minimal   Participation Quality: Independent   Behavior: Appropriate   Speech/Thought Process: Relevant   Insight: Fair   Judgement: Fair    Modes of Intervention: Teaching Laboratory Technician   Patient Response to Interventions:  Attentive   Education Outcome:  In group clarification offered    Clinical Observations/Individualized Feedback: Pt attended group session. Pt interacted with Sebastian when she went around the room. Pt was receptive and attentive. Pt was also coloring a picture as well.     Plan: Continue to engage patient in RT group sessions 2-3x/week.   Maddyx Wieck-McCall, LRT,CTRS 05/20/2023 1:47 PM

## 2023-05-20 NOTE — Plan of Care (Signed)

## 2023-05-20 NOTE — Progress Notes (Signed)
   05/19/23 2000  Psych Admission Type (Psych Patients Only)  Admission Status Involuntary  Psychosocial Assessment  Patient Complaints Anxiety (Anxiety 2/10)  Eye Contact Fair  Facial Expression Grimacing  Affect Blunted  Speech Logical/coherent  Interaction Assertive  Motor Activity Slow  Appearance/Hygiene Unremarkable  Behavior Characteristics Cooperative  Mood Depressed  Thought Process  Coherency WDL  Content WDL  Delusions None reported or observed  Perception WDL  Hallucination None reported or observed  Judgment Poor  Confusion None  Danger to Self  Current suicidal ideation? Denies  Agreement Not to Harm Self Yes  Description of Agreement Verbal  Danger to Others  Danger to Others None reported or observed

## 2023-05-21 ENCOUNTER — Ambulatory Visit: Payer: Self-pay | Admitting: Internal Medicine

## 2023-05-21 DIAGNOSIS — F250 Schizoaffective disorder, bipolar type: Secondary | ICD-10-CM | POA: Diagnosis not present

## 2023-05-21 NOTE — Progress Notes (Signed)
   05/20/23 2100  Psych Admission Type (Psych Patients Only)  Admission Status Involuntary  Psychosocial Assessment  Patient Complaints None  Eye Contact Fair  Facial Expression Pensive  Affect Appropriate to circumstance  Speech Logical/coherent  Interaction Assertive  Motor Activity Slow  Appearance/Hygiene Unremarkable  Behavior Characteristics Cooperative  Mood Pleasant  Thought Process  Coherency WDL  Content WDL  Delusions None reported or observed  Perception WDL  Hallucination None reported or observed  Judgment Poor  Confusion None  Danger to Self  Current suicidal ideation? Denies  Agreement Not to Harm Self Yes  Description of Agreement Verbal  Danger to Others  Danger to Others None reported or observed

## 2023-05-21 NOTE — Group Note (Signed)
 Recreation Therapy Group Note   Group Topic:Team Building  Group Date: 05/21/2023 Start Time: 9062 End Time: 0959 Facilitators: Montzerrat Brunell-McCall, LRT,CTRS Location: [address] Dayroom   Group Topic: Communication, Team Building, Problem Solving  Goal Area(s) Addresses:  Patient will effectively work with peer towards shared goal.  Patient will identify skills used to make activity successful.  Patient will identify how skills used during activity can be applied to reach post d/c goals.   Intervention: STEM Activity- Glass Blower/designer  Activity: Tallest [employer]. In teams of 5-6, patients were given 11 craft pipe cleaners. Using the materials provided, patients were instructed to compete again the opposing team(s) to build the tallest free-standing structure from floor level. The activity was timed; difficulty increased by clinical research associate as production designer, theatre/television/film continued.  Systematically resources were removed with additional directions for example, placing one arm behind their back, working in silence, and shape stipulations. LRT facilitated post-activity discussion reviewing team processes and necessary communication skills involved in completion. Patients were encouraged to reflect how the skills utilized, or not utilized, in this activity can be incorporated to positively impact support systems post discharge.  Education: Pharmacist, Community, Scientist, Physiological, Discharge Planning   Education Outcome: Acknowledges education/In group clarification offered/Needs additional education.    Affect/Mood: Appropriate   Participation Level: Engaged   Participation Quality: Independent   Behavior: Appropriate   Speech/Thought Process: Focused   Insight: Good   Judgement: Good   Modes of Intervention: Team-building   Patient Response to Interventions:  Engaged   Education Outcome:  In group clarification offered    Clinical Observations/Individualized Feedback: Pt was bright and  engaged. Pt appeared to be the leader in his group. Pt also listened to the suggestions of his peers. Pt was focused and worked well with peers in completing the activity.     Plan: Continue to engage patient in RT group sessions 2-3x/week.   Nakeitha Milligan-McCall, LRT,CTRS 05/21/2023 1:18 PM

## 2023-05-21 NOTE — Progress Notes (Signed)
 [facility] MD Progress Note  05/21/2023 12:55 PM Ronald [name]  MRN:  [id number]  Principal Problem: Schizoaffective disorder, bipolar type (HCC) Diagnosis: Principal Problem:   Schizoaffective disorder, bipolar type (HCC) Active Problems:   GAD (generalized anxiety disorder)   Cannabis use disorder  HPI: Patient is a 43 year old Caucasian male with prior mental health diagnoses of anxiety, cannabis use disorder, PTSD & Benzodiazepine abuse, who presented to the the [facility]) on 2/3 via law enforcement after his mother called for assistance for worsening aggressive behaviors & paranoia at home as per information obtained from his mother by provider at [facility]. As per documentation from the [facility]:   He has begun talking to himself in response to hearing voices.  He reports they are faint whispers.  Accoreding to mother, the patient appears to be paranoid, as he is fixated with the TV remote and refuses to leave home.  He also will refuse to open the door to the neighbors who come to check on them.  [name], [name], [name], Date of Service: 05/17/2023 10:16 AM)   24 hr chart Review:  SBP in the 130s, and DBP in the 90s. Pt is compliant with medications, required trazodone  last night for sleep, Tylenol  for pain, and Maalox for indigestion last night.  Patient slept for 8.5 hours last night.  No behavioral concerns from nursing reported.  Patient assessment note:  Patient is continuing to present with a flat affect & depressed moor, speaks almost in a monotone-like manner.  Denies SI, denies HI, denies AVH, denies paranoia, there is no evidence of delusional thinking. He is visible in the day room participating in unit activities and interacting with peers. He reports that sleep last night was good, appetite is good, he denies being in any physical pain, denies any medication related side effects.  No TD/EPS type symptoms found on assessment, and pt denies any feelings of  stiffness. AIMS: 0. We will continue medications as listed below and will continue to monitor responses.  Our plan continues to be that if symptoms continue to improve and stabilize, we will discharge patient by the end of this week, after discharge safety planning and outpatient follow-up appointments are completed. Pt is doing well on current medication regimen without a need for adjustments today. Continuing Zyprexa  5 mg in the morning and Zyprexa  10 mg nightly for mood stabilization and psychosis.  Total Time spent with patient: 45 minutes  Past Psychiatric History: See H & P  Past Medical History:  Past Medical History:  Diagnosis Date   Anxiety    Arthritis    Disorder of pineal gland    Post traumatic stress disorder     Past Surgical History:  Procedure Laterality Date   ANKLE SURGERY     Family History: History reviewed. No pertinent family history. Family Psychiatric  History: See H & P Social History:  Social History   Substance and Sexual Activity  Alcohol Use No     Social History   Substance and Sexual Activity  Drug Use Yes   Types: Marijuana   Comment: one year ago    Social History   Socioeconomic History   Marital status: Single    Spouse name: Not on file   Number of children: Not on file   Years of education: Not on file   Highest education level: Not on file  Occupational History   Not on file  Tobacco Use   Smoking status: Never  Smokeless tobacco: Current    Types: Chew  Vaping Use   Vaping status: Never Used  Substance and Sexual Activity   Alcohol use: No   Drug use: Yes    Types: Marijuana    Comment: one year ago   Sexual activity: Not Currently  Other Topics Concern   Not on file  Social History Narrative   Not on file   Social Drivers of Health   Financial Resource Strain: Not on file  Food Insecurity: No Food Insecurity (05/20/2023)   Hunger Vital Sign    Worried About Running Out of Food in the Last Year: Never true     Ran Out of Food in the Last Year: Never true  Transportation Needs: No Transportation Needs (05/20/2023)   PRAPARE - Administrator, Civil Service (Medical): No    Lack of Transportation (Non-Medical): No  Physical Activity: Not on file  Stress: Not on file  Social Connections: Not on file   Sleep: Good  Appetite:  Fair  Current Medications: Current Facility-Administered Medications  Medication Dose Route Frequency Provider Last Rate Last Admin   acetaminophen  (TYLENOL ) tablet 650 mg  650 mg Oral Q6H PRN Coleman, Carolyn H, NP   650 mg at 05/20/23 2119   alum & mag hydroxide-simeth (MAALOX/MYLANTA) 200-200-20 MG/5ML suspension 30 mL  30 mL Oral Q4H PRN Mardy Elveria DEL, NP   30 mL at 05/20/23 2346   feeding supplement (ENSURE ENLIVE / ENSURE PLUS) liquid 237 mL  237 mL Oral BID BM Massengill, Rankin, MD   237 mL at 05/21/23 1103   magnesium  hydroxide (MILK OF MAGNESIA) suspension 30 mL  30 mL Oral Daily PRN Mardy Elveria DEL, NP       melatonin tablet 10 mg  10 mg Oral QHS [name], [name], NP   10 mg at 05/20/23 2119   multivitamin with minerals tablet 1 tablet  1 tablet Oral Q breakfast [name], [name], NP   1 tablet at 05/21/23 0807   nicotine  (NICODERM CQ  - dosed in mg/24 hours) patch 14 mg  14 mg Transdermal Daily Coleman, Carolyn H, NP   14 mg at 05/21/23 9193   nicotine  polacrilex (NICORETTE ) gum 2 mg  2 mg Oral Q4H PRN [name], [name], NP   2 mg at 05/21/23 9191   OLANZapine  (ZYPREXA ) injection 10 mg  10 mg Intramuscular TID PRN Mardy Elveria DEL, NP       OLANZapine  (ZYPREXA ) injection 5 mg  5 mg Intramuscular TID PRN Mardy Elveria DEL, NP       OLANZapine  (ZYPREXA ) tablet 10 mg  10 mg Oral QHS Coleman, Carolyn H, NP   10 mg at 05/20/23 2119   OLANZapine  (ZYPREXA ) tablet 5 mg  5 mg Oral Daily Coleman, Carolyn H, NP   5 mg at 05/21/23 9191   OLANZapine  zydis (ZYPREXA ) disintegrating tablet 5 mg  5 mg Oral TID PRN Coleman, Carolyn H, NP   5 mg at 05/19/23 1435    traZODone  (DESYREL ) tablet 50 mg  50 mg Oral QHS PRN Mardy Elveria DEL, NP   50 mg at 05/20/23 2119    Lab Results:  Results for orders placed or performed during the hospital encounter of 05/18/23 (from the past 48 hours)  Lactic acid, plasma     Status: Abnormal   Collection Time: 05/19/23  7:01 PM  Result Value Ref Range   Lactic Acid, Venous 2.2 (HH) 0.5 - 1.9 mmol/L    Comment: CRITICAL RESULT  CALLED TO, READ BACK BY AND VERIFIED WITH C. PORTERFIELD, RN 05/18/22 2037 BY K. DAVIS Performed at [facility], [address] [place], [name] [address]   I-Stat CG4 Lactic Acid, ED     Status: None   Collection Time: 05/20/23  1:36 AM  Result Value Ref Range   Lactic Acid, Venous 1.2 0.5 - 1.9 mmol/L  Ammonia     Status: None   Collection Time: 05/20/23  6:08 PM  Result Value Ref Range   Ammonia 22 9 - 35 umol/L    Comment: Performed at [facility], [address] [place], [name] [address]  Valproic  acid level     Status: Abnormal   Collection Time: 05/20/23  6:08 PM  Result Value Ref Range   Valproic  Acid Lvl <10 (L) 50.0 - 100.0 ug/mL    Comment: RESULT CONFIRMED BY MANUAL DILUTION Performed at [facility], [address], [place], [name] [address]     Blood Alcohol level:  Lab Results  Component Value Date   [facility] <10 05/17/2023   ETH <10 05/15/2023    Metabolic Disorder Labs: Lab Results  Component Value Date   HGBA1C 4.9 05/17/2023   MPG 93.93 05/17/2023   MPG 93.93 09/29/2021   No results found for: PROLACTIN Lab Results  Component Value Date   CHOL 152 05/17/2023   TRIG 70 05/17/2023   HDL 78 05/17/2023   CHOLHDL 1.9 05/17/2023   VLDL 14 05/17/2023   LDLCALC 60 05/17/2023   LDLCALC 55 02/19/2020   Physical Findings: AIMS:0  CIWA: n/a COWS:  n/a  Musculoskeletal: Strength & Muscle Tone: within normal limits Gait & Station: normal Patient leans: N/A  Psychiatric Specialty  Exam:  Presentation  General Appearance:  Fairly Groomed  Eye Contact: Fair  Speech: Clear and Coherent  Speech Volume: Normal  Handedness: Right  Mood and Affect  Mood: Depressed  Affect: Congruent  Thought Process  Thought Processes: Coherent  Descriptions of Associations:Intact  Orientation:Full (Time, Place and Person)  Thought Content:Logical  History of Schizophrenia/Schizoaffective disorder:Yes  Duration of Psychotic Symptoms:Greater than six months  Hallucinations:Hallucinations: None  Ideas of Reference:None  Suicidal Thoughts:Suicidal Thoughts: No  Homicidal Thoughts:Homicidal Thoughts: No  Sensorium  Memory: Immediate Fair  Judgment: Fair  Insight: Fair  Chartered Certified Accountant: Fair  Attention Span: Fair  Recall: [employer] of Knowledge: Fair  Language: Fair  Psychomotor Activity  Psychomotor Activity: Psychomotor Activity: Normal  Assets  Assets: Resilience   Sleep  Sleep: Sleep: Good   Physical Exam: Physical Exam Vitals and nursing note reviewed.  Constitutional:      Appearance: Normal appearance.  Musculoskeletal:        General: Normal range of motion.     Cervical back: Normal range of motion.  Neurological:     Mental Status: He is alert.    Review of Systems  Psychiatric/Behavioral:  Positive for depression and substance abuse. Negative for hallucinations, memory loss and suicidal ideas. The patient is nervous/anxious and has insomnia.   All other systems reviewed and are negative.  Blood pressure (!) 139/92, pulse 76, temperature 98.2 F (36.8 C), temperature source Oral, resp. rate 16, height 5' 11 (1.803 m), weight 69.3 kg, SpO2 100%. Body mass index is 21.31 kg/m.  Treatment Plan Summary: Daily contact with patient to assess and evaluate symptoms and progress in treatment and Medication management   Safety and Monitoring: Voluntary admission to inpatient psychiatric unit  for safety, stabilization  and treatment Daily contact with patient to assess and evaluate symptoms and progress in treatment Patient's case to be discussed in multi-disciplinary team meeting Observation Level : q15 minute checks Vital signs: q12 hours Precautions: Safety   Long Term Goal(s): Improvement in symptoms so as ready for discharge   Short Term Goals: Ability to identify changes in lifestyle to reduce recurrence of condition will improve, Ability to verbalize feelings will improve, Ability to disclose and discuss suicidal ideas, Ability to demonstrate self-control will improve, Ability to identify and develop effective coping behaviors will improve, Ability to maintain clinical measurements within normal limits will improve, Compliance with prescribed medications will improve, and Ability to identify triggers associated with substance abuse/mental health issues will improve   Diagnoses Principal Problem:   Schizoaffective disorder, bipolar type (HCC) Active Problems:   GAD (generalized anxiety disorder)   Cannabis use disorder   Medications -Continue Zyprexa  5 mg in the mornings for psychosis -Continue Zyprexa  10 mg nightly for psychosis and mood stabilization -Continue nicotine  patch 14 mg daily for nicotine  dependence -Continue melatonin 10 mg nightly for sleep -Continue Ensure twice daily in between meals for nutritional supplementation -Continue multivitamins daily for nutritional supplementation   PRNS -Continue agitation protocol medications as per the [facility] -Continue Nicorette  gum as needed every 4 hours for nicotine  dependence -Continue trazodone  50 mg nightly as needed for sleep -Continue Tylenol  650 mg every 6 hours PRN for mild pain -Continue Maalox 30 mg every 4 hrs PRN for indigestion -Continue Milk of Magnesia as needed every 6 hrs for constipation   Labs Review: Valproic  acid level 146 2 days ago, repeated (2/3) and was 102. Ammonia elevated at 64 2 days ago,  repeated and was 50. Was WNL at 22 yesterday. CBC, CMP, lipid panel, hemoglobin A1c review.  Toxicology positive for THC.   Discharge Planning: Social work and case management to assist with discharge planning and identification of hospital follow-up needs prior to discharge Estimated LOS: 5-7 days Discharge Concerns: Need to establish a safety plan; Medication compliance and effectiveness Discharge Goals: Return home with outpatient referrals for mental health follow-up including medication management/psychotherapy   I certify that inpatient services furnished can reasonably be expected to improve the patient's condition.    Donia Snell, NP 05/21/2023, 12:55 PM Patient ID: Ronald [name], male   DOB: [birth year], 43 y.o.   MRN: [id number]

## 2023-05-21 NOTE — [facility] Group Notes (Signed)
 [facility] Group Notes:  (Nursing/MHT/Case Management/Adjunct)  Date:  05/21/2023  Time:  11:09 PM  Type of Therapy:  Psychoeducational Skills  Participation Level:  Active  Participation Quality:  Appropriate  Affect:  Blunted  Cognitive:  Appropriate  Insight:  Appropriate  Engagement in Group:  Developing/Improving  Modes of Intervention:  Education  Summary of Progress/Problems: The patient rated his day as a 10 out of a possible 10. He shared with the group that he had a good visit with his mother and that he is working on getting his medication regulated. He intends to work on organizing his discharge plans tomorrow .   Ronald [name] S 05/21/2023, 11:09 PM

## 2023-05-21 NOTE — Plan of Care (Signed)
   Problem: Education: Goal: Emotional status will improve Outcome: Progressing Goal: Mental status will improve Outcome: Progressing   Problem: Activity: Goal: Interest or engagement in activities will improve Outcome: Progressing Goal: Sleeping patterns will improve Outcome: Progressing   Problem: Coping: Goal: Ability to verbalize frustrations and anger appropriately will improve Outcome: Progressing Goal: Ability to demonstrate self-control will improve Outcome: Progressing

## 2023-05-21 NOTE — Progress Notes (Signed)
   05/21/23 0810  Charting Type  Charting Type Shift assessment  Safety Check Verification  Has the RN verified the 15 minute safety check completion? Yes  Neurological  Neuro (WDL) WDL  HEENT  HEENT (WDL) WDL  Respiratory  Respiratory (WDL) WDL  Cardiac  Cardiac (WDL) WDL  Vascular  Vascular (WDL) WDL  Integumentary  Integumentary (WDL) X (see admission skin assessment)  Braden Scale (Ages 8 and up)  Sensory Perceptions 4  Moisture 4  Activity 4  Mobility 4  Nutrition 3  Friction and Shear 3  Braden Scale Score 22  Musculoskeletal  Musculoskeletal (WDL) WDL  Gastrointestinal  Gastrointestinal (WDL) WDL  GU Assessment  Genitourinary (WDL) WDL  Neurological  Level of Consciousness Alert

## 2023-05-21 NOTE — Progress Notes (Signed)
 Patient became irritable after dinner because he didn't make it to get a soda. Patient then walked away and called his mother. Patient began to state that everyone was gone and he was in the hospital all by himself. I spoke to patient mother, explained that he missed getting a soda and that everyone was coming back, I redirected patient and reoriented patient. Patient began to calm down when he saw staff coming back from dinner. Patient ate dinner now, he is calm, collected, talking to other patients in the hallway.

## 2023-05-21 NOTE — Group Note (Signed)
 Date:  05/21/2023 Time:  5:21 PM  Group Topic/Focus:  Self Care:   The focus of this group is to help patients understand the importance of self-care in order to improve or restore emotional, physical, spiritual, interpersonal, and financial health.    Participation Level:  Active  Participation Quality:  Appropriate   Ronald [name] 05/21/2023, 5:21 PM

## 2023-05-21 NOTE — Plan of Care (Signed)
   Problem: Education: Goal: Emotional status will improve Outcome: Progressing Goal: Mental status will improve Outcome: Progressing   Problem: Activity: Goal: Interest or engagement in activities will improve Outcome: Progressing Goal: Sleeping patterns will improve Outcome: Progressing   Problem: Safety: Goal: Periods of time without injury will increase Outcome: Progressing

## 2023-05-21 NOTE — [facility] Group Notes (Signed)
 Spirituality group facilitated by Elia Rockie Sofia, [facility].  Group Description: Group focused on topic of community. Patients participated in facilitated discussion around topic, connecting with one another around experiences and definitions for community. Group members engaged with visual explorer photos, reflecting on what community looks like for them today. Group engaged in discussion around how their definitions of community are present today in hospital.  Modalities: Psycho-social ed, Adlerian, Narrative, MI  Patient Progress: Ronald [name] attended group and actively engaged and participated in group conversation and activities.

## 2023-05-21 NOTE — Group Note (Signed)
 Date:  05/21/2023 Time:  9:03 AM  Group Topic/Focus:  Goals Group:   The focus of this group is to help patients establish daily goals to achieve during treatment and discuss how the patient can incorporate goal setting into their daily lives to aide in recovery. Orientation:   The focus of this group is to educate the patient on the purpose and policies of crisis stabilization and provide a format to answer questions about their admission.  The group details unit policies and expectations of patients while admitted.    Participation Level:  Active  Participation Quality:  Appropriate   Carlyon CHRISTELLA Blunt 05/21/2023, 9:03 AM

## 2023-05-22 NOTE — Progress Notes (Addendum)
 Patient presents in animated mood and stated he slept well. Patient currently denies SI,HI, and A/V/H with no plan or intent. Patient is med compliant and enjoyed his visit earlier today with his mother. Patient received tylenol  po prn due to R ankle pain which provided some relief. Patient attended recreation time and played basketball. Patient remains cooperative in unit.      05/22/23 0806  Psych Admission Type (Psych Patients Only)  Admission Status Involuntary  Psychosocial Assessment  Patient Complaints None  Eye Contact Fair  Facial Expression Animated  Affect Appropriate to circumstance  Speech Logical/coherent  Interaction Assertive  Motor Activity Other (Comment) (WDL)  Appearance/Hygiene Unremarkable  Behavior Characteristics Cooperative;Appropriate to situation  Mood Pleasant  Thought Process  Coherency WDL  Content WDL  Delusions None reported or observed  Perception WDL  Hallucination None reported or observed  Judgment Poor  Confusion None  Danger to Self  Current suicidal ideation? Denies  Agreement Not to Harm Self Yes  Description of Agreement verbally contracts for safety  Danger to Others  Danger to Others None reported or observed

## 2023-05-22 NOTE — Group Note (Signed)
 Date:  05/22/2023 Time:  2:00 PM  Group Topic/Focus:  Goals Group:   The focus of this group is to help patients establish daily goals to achieve during treatment and discuss how the patient can incorporate goal setting into their daily lives to aide in recovery. Orientation:   The focus of this group is to educate the patient on the purpose and policies of crisis stabilization and provide a format to answer questions about their admission.  The group details unit policies and expectations of patients while admitted.    Participation Level:  Active  Participation Quality:  Appropriate  Affect:  Appropriate  Cognitive:  Appropriate  Insight: Appropriate  Engagement in Group:  Engaged  Modes of Intervention:  Discussion  Additional Comments:    Mando Blatz D Estreya Clay 05/22/2023, 2:00 PM

## 2023-05-22 NOTE — Progress Notes (Signed)
   05/21/23 2000  Psych Admission Type (Psych Patients Only)  Admission Status Involuntary  Psychosocial Assessment  Patient Complaints None  Eye Contact Fair  Facial Expression Pensive  Affect Appropriate to circumstance  Speech Logical/coherent  Interaction Assertive  Motor Activity Slow  Appearance/Hygiene Unremarkable  Behavior Characteristics Cooperative;Guarded  Mood Depressed  Thought Process  Coherency WDL  Content WDL  Delusions None reported or observed  Perception WDL  Hallucination None reported or observed  Judgment Poor  Confusion None  Danger to Self  Current suicidal ideation? Denies  Agreement Not to Harm Self Yes  Description of Agreement Verbal  Danger to Others  Danger to Others None reported or observed

## 2023-05-22 NOTE — Plan of Care (Signed)
  Problem: Education: Goal: Verbalization of understanding the information provided will improve Outcome: Progressing   Problem: Activity: Goal: Interest or engagement in activities will improve Outcome: Progressing Goal: Sleeping patterns will improve Outcome: Progressing   Problem: Health Behavior/Discharge Planning: Goal: Compliance with treatment plan for underlying cause of condition will improve Outcome: Progressing   Problem: Safety: Goal: Periods of time without injury will increase Outcome: Progressing

## 2023-05-22 NOTE — Plan of Care (Signed)

## 2023-05-22 NOTE — [facility] Group Notes (Signed)
 [facility] Group Notes:  (Nursing/MHT/Case Management/Adjunct)  Date:  05/22/2023  Time:  9:54 PM  Type of Therapy:   Wrap-up group  Participation Level:  Active  Participation Quality:  Appropriate  Affect:  Appropriate  Cognitive:  Appropriate  Insight:  Appropriate  Engagement in Group:  Engaged  Modes of Intervention:  Education  Summary of Progress/Problems: Pt goal to get on right medication. Rated day 10/10.  Ronald [name] [place] 05/22/2023, 9:54 PM

## 2023-05-22 NOTE — Progress Notes (Signed)
 [facility] MD Progress Note  05/22/2023 12:03 PM Ronald [name]  MRN:  [id number]  Principal Problem: Schizoaffective disorder, bipolar type (HCC) Diagnosis: Principal Problem:   Schizoaffective disorder, bipolar type (HCC) Active Problems:   GAD (generalized anxiety disorder)   Cannabis use disorder  HPI: Patient is a 43 year old Caucasian male with prior mental health diagnoses of anxiety, cannabis use disorder, PTSD & Benzodiazepine abuse, who presented to the the [facility]) on 2/3 via law enforcement after his mother called for assistance for worsening aggressive behaviors & paranoia at home as per information obtained from his mother by provider at [facility]. As per documentation from the [facility]:   He has begun talking to himself in response to hearing voices.  He reports they are faint whispers.  Accoreding to mother, the patient appears to be paranoid, as he is fixated with the TV remote and refuses to leave home.  He also will refuse to open the door to the neighbors who come to check on them.  [name], [name], [name], Date of Service: 05/17/2023 10:16 AM)   24 hr chart Review:  SBP in the 130s, and DBP in the 90s. Pt is compliant with medications, required trazodone  last night for sleep, Tylenol  for pain, and Maalox for indigestion last night.  Patient slept for 8.5 hours last night.  No behavioral concerns from nursing reported.  Daily notes: Ronald [name] is seen today, char reviewed. The chart findings discussed with the treatment team. Patient presents alert, oriented & aware of situation. He presents a bit disheveled. He reports, I'm back in the hospital because my family is yet faced with another tragedy. My cousin recently died of fentanyl  overdose & my mother got diagnosed with lung cancer. Things became so overwhelming for me. I stopped taking my medicines for a while. I want to be well again so I can support my mother go through her cancer treatment. I have  started my medicines again & I'm doing well. I slept well last night. Patient currently denies any SIHI, AVH, delusional thoughts or paranoia. He does not appear to be responding to any internal stimuli. He is attending group sessions. He denies any new issues or concerns.  Per previous notes: Our plan continues to be that if symptoms continue to improve and stabilize, we will discharge patient by the end of this week, after discharge safety planning and outpatient follow-up appointments are completed. Pt is doing well on current medication regimen without a need for adjustments today. Continuing Zyprexa  5 mg in the morning and Zyprexa  10 mg nightly for mood stabilization and psychosis.  Total Time spent with patient: 45 minutes  Past Psychiatric History: See H & P  Past Medical History:  Past Medical History:  Diagnosis Date   Anxiety    Arthritis    Disorder of pineal gland    Post traumatic stress disorder     Past Surgical History:  Procedure Laterality Date   ANKLE SURGERY     Family History: History reviewed. No pertinent family history.  Family Psychiatric  History: See H & P  Social History:  Social History   Substance and Sexual Activity  Alcohol Use No     Social History   Substance and Sexual Activity  Drug Use Yes   Types: Marijuana   Comment: one year ago    Social History   Socioeconomic History   Marital status: Single    Spouse name: Not on file   Number of  children: Not on file   Years of education: Not on file   Highest education level: Not on file  Occupational History   Not on file  Tobacco Use   Smoking status: Never   Smokeless tobacco: Current    Types: Chew  Vaping Use   Vaping status: Never Used  Substance and Sexual Activity   Alcohol use: No   Drug use: Yes    Types: Marijuana    Comment: one year ago   Sexual activity: Not Currently  Other Topics Concern   Not on file  Social History Narrative   Not on file   Social Drivers  of Health   Financial Resource Strain: Not on file  Food Insecurity: No Food Insecurity (05/20/2023)   Hunger Vital Sign    Worried About Running Out of Food in the Last Year: Never true    Ran Out of Food in the Last Year: Never true  Transportation Needs: No Transportation Needs (05/20/2023)   PRAPARE - Administrator, Civil Service (Medical): No    Lack of Transportation (Non-Medical): No  Physical Activity: Not on file  Stress: Not on file  Social Connections: Not on file   Sleep: Good  Appetite:  Fair  Current Medications: Current Facility-Administered Medications  Medication Dose Route Frequency Provider Last Rate Last Admin   acetaminophen  (TYLENOL ) tablet 650 mg  650 mg Oral Q6H PRN Coleman, Carolyn H, NP   650 mg at 05/22/23 0805   alum & mag hydroxide-simeth (MAALOX/MYLANTA) 200-200-20 MG/5ML suspension 30 mL  30 mL Oral Q4H PRN Mardy Elveria DEL, NP   30 mL at 05/20/23 2346   feeding supplement (ENSURE ENLIVE / ENSURE PLUS) liquid 237 mL  237 mL Oral BID BM Massengill, Rankin, MD   237 mL at 05/22/23 1057   magnesium  hydroxide (MILK OF MAGNESIA) suspension 30 mL  30 mL Oral Daily PRN Mardy Elveria DEL, NP   30 mL at 05/21/23 1701   melatonin tablet 10 mg  10 mg Oral QHS [name], [name], NP   10 mg at 05/21/23 2124   multivitamin with minerals tablet 1 tablet  1 tablet Oral Q breakfast [name], [name], NP   1 tablet at 05/22/23 0806   nicotine  (NICODERM CQ  - dosed in mg/24 hours) patch 14 mg  14 mg Transdermal Daily Coleman, Carolyn H, NP   14 mg at 05/22/23 9193   nicotine  polacrilex (NICORETTE ) gum 2 mg  2 mg Oral Q4H PRN [name], [name], NP   2 mg at 05/22/23 1056   OLANZapine  (ZYPREXA ) injection 10 mg  10 mg Intramuscular TID PRN Mardy Elveria DEL, NP       OLANZapine  (ZYPREXA ) injection 5 mg  5 mg Intramuscular TID PRN Mardy Elveria DEL, NP       OLANZapine  (ZYPREXA ) tablet 10 mg  10 mg Oral QHS Coleman, Carolyn H, NP   10 mg at 05/21/23 2124    OLANZapine  (ZYPREXA ) tablet 5 mg  5 mg Oral Daily Mardy Elveria DEL, NP   5 mg at 05/22/23 9193   OLANZapine  zydis (ZYPREXA ) disintegrating tablet 5 mg  5 mg Oral TID PRN Coleman, Carolyn H, NP   5 mg at 05/19/23 1435   traZODone  (DESYREL ) tablet 50 mg  50 mg Oral QHS PRN Coleman, Carolyn H, NP   50 mg at 05/21/23 2316    Lab Results:  Results for orders placed or performed during the hospital encounter of 05/18/23 (from the past 48  hours)  Ammonia     Status: None   Collection Time: 05/20/23  6:08 PM  Result Value Ref Range   Ammonia 22 9 - 35 umol/L    Comment: Performed at [facility], [address] [place], [name] [address]  Valproic  acid level     Status: Abnormal   Collection Time: 05/20/23  6:08 PM  Result Value Ref Range   Valproic  Acid Lvl <10 (L) 50.0 - 100.0 ug/mL    Comment: RESULT CONFIRMED BY MANUAL DILUTION Performed at [facility], [address], [place], [name] [address]    Blood Alcohol level:  Lab Results  Component Value Date   [facility] <10 05/17/2023   ETH <10 05/15/2023   Metabolic Disorder Labs: Lab Results  Component Value Date   HGBA1C 4.9 05/17/2023   MPG 93.93 05/17/2023   MPG 93.93 09/29/2021   No results found for: PROLACTIN Lab Results  Component Value Date   CHOL 152 05/17/2023   TRIG 70 05/17/2023   HDL 78 05/17/2023   CHOLHDL 1.9 05/17/2023   VLDL 14 05/17/2023   LDLCALC 60 05/17/2023   LDLCALC 55 02/19/2020   Physical Findings: AIMS:0  CIWA: n/a COWS:  n/a  Musculoskeletal: Strength & Muscle Tone: within normal limits Gait & Station: normal Patient leans: N/A  Psychiatric Specialty Exam:  Presentation  General Appearance:  Fairly Groomed  Eye Contact: Fair  Speech: Clear and Coherent  Speech Volume: Normal  Handedness: Right  Mood and Affect  Mood: Depressed  Affect: Congruent  Thought Process  Thought Processes: Coherent  Descriptions of  Associations:Intact  Orientation:Full (Time, Place and Person)  Thought Content:Logical  History of Schizophrenia/Schizoaffective disorder:Yes  Duration of Psychotic Symptoms:Greater than six months  Hallucinations:Hallucinations: None  Ideas of Reference:None  Suicidal Thoughts:Suicidal Thoughts: No  Homicidal Thoughts:Homicidal Thoughts: No  Sensorium  Memory: Immediate Fair  Judgment: Fair  Insight: Fair  Chartered Certified Accountant: Fair  Attention Span: Fair  Recall: [employer] of Knowledge: Fair  Language: Fair  Psychomotor Activity  Psychomotor Activity: Psychomotor Activity: Normal  Assets  Assets: Resilience  Sleep  Sleep: Sleep: Good  Physical Exam: Physical Exam Vitals and nursing note reviewed.  Constitutional:      Appearance: Normal appearance.  Cardiovascular:     Rate and Rhythm: Normal rate.     Pulses: Normal pulses.  Pulmonary:     Effort: Pulmonary effort is normal.  Musculoskeletal:        General: Normal range of motion.     Cervical back: Normal range of motion.  Skin:    General: Skin is warm.  Neurological:     General: No focal deficit present.     Mental Status: He is alert and oriented to person, place, and time.    Review of Systems  Constitutional:  Negative for chills and fever.  HENT:  Negative for congestion and sore throat.   Respiratory:  Negative for cough, shortness of breath and wheezing.   Cardiovascular:  Negative for chest pain and palpitations.  Gastrointestinal:  Negative for abdominal pain, constipation, diarrhea, heartburn, nausea and vomiting.  Musculoskeletal:  Negative for joint pain and myalgias.  Neurological:  Negative for dizziness, tingling, tremors, sensory change, speech change, focal weakness, seizures, loss of consciousness, weakness and headaches.  Endo/Heme/Allergies:        Allergies: Alupent, Benadryl , Klonopin .  Psychiatric/Behavioral:  Positive for depression and  substance abuse. Negative for hallucinations, memory loss and suicidal ideas. The patient is  nervous/anxious and has insomnia.   All other systems reviewed and are negative.  Blood pressure 134/84, pulse 72, temperature 98.2 F (36.8 C), temperature source Oral, resp. rate 16, height 5' 11 (1.803 m), weight 69.3 kg, SpO2 98%. Body mass index is 21.31 kg/m.  Treatment Plan Summary: Daily contact with patient to assess and evaluate symptoms and progress in treatment and Medication management   Safety and Monitoring: Voluntary admission to inpatient psychiatric unit for safety, stabilization and treatment Daily contact with patient to assess and evaluate symptoms and progress in treatment Patient's case to be discussed in multi-disciplinary team meeting Observation Level : q15 minute checks Vital signs: q12 hours Precautions: Safety   Long Term Goal(s): Improvement in symptoms so as ready for discharge   Short Term Goals: Ability to identify changes in lifestyle to reduce recurrence of condition will improve, Ability to verbalize feelings will improve, Ability to disclose and discuss suicidal ideas, Ability to demonstrate self-control will improve, Ability to identify and develop effective coping behaviors will improve, Ability to maintain clinical measurements within normal limits will improve, Compliance with prescribed medications will improve, and Ability to identify triggers associated with substance abuse/mental health issues will improve   Diagnoses Principal Problem:   Schizoaffective disorder, bipolar type (HCC) Active Problems:   GAD (generalized anxiety disorder)   Cannabis use disorder   Medications -Continue Zyprexa  5 mg in the mornings for psychosis -Continue Zyprexa  10 mg nightly for psychosis and mood stabilization -Continue nicotine  patch 14 mg daily for nicotine  dependence -Continue melatonin 10 mg nightly for sleep -Continue Ensure twice daily in between meals for  nutritional supplementation -Continue multivitamins daily for nutritional supplementation   PRNS -Continue agitation protocol medications as per the [facility] -Continue Nicorette  gum as needed every 4 hours for nicotine  dependence -Continue trazodone  50 mg nightly as needed for sleep -Continue Tylenol  650 mg every 6 hours PRN for mild pain -Continue Maalox 30 mg every 4 hrs PRN for indigestion -Continue Milk of Magnesia as needed every 6 hrs for constipation   Labs Review: Valproic  acid level 146 2 days ago, repeated (2/3) and was 102. Ammonia elevated at 64 2 days ago, repeated and was 50. Was WNL at 22 yesterday. CBC, CMP, lipid panel, hemoglobin A1c review.  Toxicology positive for THC.   Discharge Planning: Social work and case management to assist with discharge planning and identification of hospital follow-up needs prior to discharge Estimated LOS: 5-7 days Discharge Concerns: Need to establish a safety plan; Medication compliance and effectiveness Discharge Goals: Return home with outpatient referrals for mental health follow-up including medication management/psychotherapy   I certify that inpatient services furnished can reasonably be expected to improve the patient's condition.    Mac Bolster, NP, pmhnp, fnp-bc. 05/22/2023, 12:03 PM Patient ID: Ronald [name], male   DOB: 1980/11/04, 43 y.o.   MRN: [id number] Patient ID: Ronald [name], male   DOB: 1980/10/11, 43 y.o.   MRN: [id number]

## 2023-05-22 NOTE — Group Note (Signed)
 LCSW Group Therapy Note  Group Date: 05/22/2023 Start Time: 1100 End Time: 1200  Participation:  patient was present and actively participated in the conversation.  He was insightful and had a lot of valuable information on the topic discussed.   Type:  Group Therapy  Topic:  Stress Less:  Nurturing Your Mind and Body Through Calm  Objective:  Learn techniques for managing stress through body relaxation, mindfulness, and self-compassion.  Goals: Use body relaxation techniques, such as Box Breathing and Progressive Muscle Relaxation, to reduce physical tension. Practice mindfulness to break the cycle of overthinking and mental chatter. Embrace self-compassion to handle stress with kindness and resilience.  Summary:  Today's session focused on calming the body with relaxation techniques, breaking the cycle of stress with mindfulness, and using self-compassion to manage challenges more gracefully.  These tools help reduce stress and foster a balanced, peaceful mindset.  Therapeutic Modalities:  Elements of CBT (cognitive restructuring)  Elements of DBT (box breathing, progressive body relaxation, mindfulness, acceptance)    Latarsha Zani O Abdoul Encinas, LCSWA 05/22/2023  1:36 PM

## 2023-05-22 NOTE — Group Note (Signed)
 Date:  05/22/2023 Time:  2:39 PM  Group Topic/Focus:  Emotional Education:   The focus of this group is to discuss boundaries, different types of boundaries, as well as healthy ways to communicate with others.    Participation Level:  Active  Participation Quality:  Appropriate and Sharing  Affect:  Appropriate  Cognitive:  Appropriate  Insight: Appropriate  Engagement in Group:  Engaged  Modes of Intervention:  Discussion and Education  Additional Comments:    Ronald [name] 05/22/2023, 2:39 PM

## 2023-05-23 ENCOUNTER — Encounter (HOSPITAL_COMMUNITY): Payer: Self-pay

## 2023-05-23 MED ORDER — DIVALPROEX SODIUM ER 500 MG PO TB24
500.0000 mg | ORAL_TABLET | Freq: Two times a day (BID) | ORAL | Status: DC
  Administered 2023-05-23 – 2023-05-27 (×8): 500 mg via ORAL
  Filled 2023-05-23 (×12): qty 1

## 2023-05-23 MED ORDER — GABAPENTIN 100 MG PO CAPS
200.0000 mg | ORAL_CAPSULE | Freq: Three times a day (TID) | ORAL | Status: DC
  Administered 2023-05-23 – 2023-05-27 (×11): 200 mg via ORAL
  Filled 2023-05-23 (×15): qty 2

## 2023-05-23 NOTE — Progress Notes (Signed)
   05/22/23 2000  Psych Admission Type (Psych Patients Only)  Admission Status Involuntary  Psychosocial Assessment  Patient Complaints None  Eye Contact Fair  Facial Expression Pensive  Affect Appropriate to circumstance  Speech Logical/coherent  Interaction Assertive  Motor Activity Slow  Appearance/Hygiene Unremarkable  Behavior Characteristics Cooperative;Appropriate to situation  Mood Pleasant  Thought Process  Coherency WDL  Content WDL  Delusions None reported or observed  Perception WDL  Hallucination None reported or observed  Judgment Poor  Confusion None  Danger to Self  Current suicidal ideation? Denies  Agreement Not to Harm Self Yes  Description of Agreement Verbal  Danger to Others  Danger to Others None reported or observed

## 2023-05-23 NOTE — Plan of Care (Signed)

## 2023-05-23 NOTE — Group Note (Signed)
 Date:  05/23/2023 Time:  9:26 PM  Group Topic/Focus:  AA Meeting    Participation Level:  Active  Participation Quality:  Appropriate  Affect:  Appropriate  Cognitive:  Appropriate  Insight: Appropriate  Engagement in Group:  Engaged  Modes of Intervention:  Socialization and Support  Additional Comments:  Patient attended AA  Eward Mace 05/23/2023, 9:26 PM

## 2023-05-23 NOTE — Progress Notes (Signed)
   05/23/23 0800  Psych Admission Type (Psych Patients Only)  Admission Status Involuntary  Psychosocial Assessment  Patient Complaints None  Eye Contact Fair  Facial Expression Animated;Anxious;Pensive;Worried  Affect Appropriate to circumstance  Surveyor, Minerals Activity Slow  Appearance/Hygiene Unremarkable  Behavior Characteristics Cooperative;Appropriate to situation  Mood Pleasant  Thought Process  Coherency WDL  Content WDL  Delusions None reported or observed  Perception WDL  Hallucination None reported or observed  Judgment Poor  Confusion None  Danger to Self  Current suicidal ideation? Denies  Agreement Not to Harm Self Yes  Description of Agreement verbal  Danger to Others  Danger to Others None reported or observed

## 2023-05-23 NOTE — BH IP Treatment Plan (Signed)
 Interdisciplinary Treatment and Diagnostic Plan Update  05/23/2023 Time of Session: 1115am - update Ronald [name] MRN: [id number]  Principal Diagnosis: Schizoaffective disorder, bipolar type (HCC)  Secondary Diagnoses: Principal Problem:   Schizoaffective disorder, bipolar type (HCC) Active Problems:   GAD (generalized anxiety disorder)   Cannabis use disorder   Current Medications:  Current Facility-Administered Medications  Medication Dose Route Frequency Provider Last Rate Last Admin   acetaminophen  (TYLENOL ) tablet 650 mg  650 mg Oral Q6H PRN Coleman, Carolyn H, NP   650 mg at 05/22/23 2101   alum & mag hydroxide-simeth (MAALOX/MYLANTA) 200-200-20 MG/5ML suspension 30 mL  30 mL Oral Q4H PRN Mardy Elveria DEL, NP   30 mL at 05/20/23 2346   feeding supplement (ENSURE ENLIVE / ENSURE PLUS) liquid 237 mL  237 mL Oral BID BM Massengill, Rankin, MD   237 mL at 05/23/23 1134   magnesium  hydroxide (MILK OF MAGNESIA) suspension 30 mL  30 mL Oral Daily PRN Mardy Elveria DEL, NP   30 mL at 05/21/23 1701   melatonin tablet 10 mg  10 mg Oral QHS [name], [name], NP   10 mg at 05/22/23 2100   multivitamin with minerals tablet 1 tablet  1 tablet Oral Q breakfast [name], [name], NP   1 tablet at 05/23/23 0809   nicotine  (NICODERM CQ  - dosed in mg/24 hours) patch 14 mg  14 mg Transdermal Daily Coleman, Carolyn H, NP   14 mg at 05/23/23 0809   nicotine  polacrilex (NICORETTE ) gum 2 mg  2 mg Oral Q4H PRN [name], [name], NP   2 mg at 05/23/23 0809   OLANZapine  (ZYPREXA ) injection 10 mg  10 mg Intramuscular TID PRN Mardy Elveria DEL, NP       OLANZapine  (ZYPREXA ) injection 5 mg  5 mg Intramuscular TID PRN Mardy Elveria DEL, NP       OLANZapine  (ZYPREXA ) tablet 10 mg  10 mg Oral QHS Coleman, Carolyn H, NP   10 mg at 05/22/23 2100   OLANZapine  (ZYPREXA ) tablet 5 mg  5 mg Oral Daily Mardy Elveria DEL, NP   5 mg at 05/23/23 0809   OLANZapine  zydis (ZYPREXA ) disintegrating tablet 5 mg  5 mg Oral  TID PRN Coleman, Carolyn H, NP   5 mg at 05/23/23 0321   traZODone  (DESYREL ) tablet 50 mg  50 mg Oral QHS PRN Coleman, Carolyn H, NP   50 mg at 05/22/23 2100   PTA Medications: Medications Prior to Admission  Medication Sig Dispense Refill Last Dose/Taking   Collagen-Vitamin C-Biotin (COLLAGEN PO) Take 1 capsule by mouth daily. (Patient not taking: Reported on 05/17/2023)      divalproex  (DEPAKOTE ) 250 MG DR tablet Take 3 tablets (750 mg total) by mouth 2 (two) times daily. (Patient not taking: Reported on 05/17/2023) 180 tablet 0    LORazepam  (ATIVAN ) 1 MG tablet Take 1 tablet (1 mg total) by mouth 3 (three) times daily as needed for anxiety. (Patient not taking: Reported on 05/17/2023) 10 tablet 0    Melatonin 10 MG TABS Take 10 mg by mouth at bedtime. (Patient not taking: Reported on 05/17/2023)      Multiple Vitamins-Minerals (MENS MULTIVITAMIN) TABS Take 1 tablet by mouth daily with breakfast. (Patient not taking: Reported on 05/17/2023) 30 tablet 3    OLANZapine  (ZYPREXA ) 10 MG tablet Take 1 tablet (10 mg total) by mouth at bedtime.      OLANZapine  (ZYPREXA ) 5 MG tablet Take 1 tablet (5 mg total) by mouth daily.  traZODone  (DESYREL ) 50 MG tablet Take 1 tablet (50 mg total) by mouth at bedtime. (Patient not taking: Reported on 05/17/2023) 90 tablet 1     Patient Stressors: Financial difficulties   Loss of multiple family members (brother, father, grandfather, cousin)   Medication change or noncompliance   Traumatic event    Patient Strengths: Ability for insight  Personnel Officer means  Physical Health  Supportive family/friends   Treatment Modalities: Medication Management, Group therapy, Case management,  1 to 1 session with clinician, Psychoeducation, Recreational therapy.   Physician Treatment Plan for Primary Diagnosis: Schizoaffective disorder, bipolar type (HCC) Long Term Goal(s): Improvement in symptoms so as ready for discharge   Short Term Goals: Ability to  identify changes in lifestyle to reduce recurrence of condition will improve Ability to verbalize feelings will improve Ability to disclose and discuss suicidal ideas Ability to demonstrate self-control will improve Ability to identify and develop effective coping behaviors will improve Ability to maintain clinical measurements within normal limits will improve Compliance with prescribed medications will improve Ability to identify triggers associated with substance abuse/mental health issues will improve  Medication Management: Evaluate patient's response, side effects, and tolerance of medication regimen.  Therapeutic Interventions: 1 to 1 sessions, Unit Group sessions and Medication administration.  Evaluation of Outcomes: Progressing  Physician Treatment Plan for Secondary Diagnosis: Principal Problem:   Schizoaffective disorder, bipolar type (HCC) Active Problems:   GAD (generalized anxiety disorder)   Cannabis use disorder  Long Term Goal(s): Improvement in symptoms so as ready for discharge   Short Term Goals: Ability to identify changes in lifestyle to reduce recurrence of condition will improve Ability to verbalize feelings will improve Ability to disclose and discuss suicidal ideas Ability to demonstrate self-control will improve Ability to identify and develop effective coping behaviors will improve Ability to maintain clinical measurements within normal limits will improve Compliance with prescribed medications will improve Ability to identify triggers associated with substance abuse/mental health issues will improve     Medication Management: Evaluate patient's response, side effects, and tolerance of medication regimen.  Therapeutic Interventions: 1 to 1 sessions, Unit Group sessions and Medication administration.  Evaluation of Outcomes: Progressing   RN Treatment Plan for Primary Diagnosis: Schizoaffective disorder, bipolar type (HCC) Long Term Goal(s):  Knowledge of disease and therapeutic regimen to maintain health will improve  Short Term Goals: Ability to remain free from injury will improve, Ability to verbalize frustration and anger appropriately will improve, Ability to participate in decision making will improve, Ability to disclose and discuss suicidal ideas, and Compliance with prescribed medications will improve  Medication Management: RN will administer medications as ordered by provider, will assess and evaluate patient's response and provide education to patient for prescribed medication. RN will report any adverse and/or side effects to prescribing provider.  Therapeutic Interventions: 1 on 1 counseling sessions, Psychoeducation, Medication administration, Evaluate responses to treatment, Monitor vital signs and CBGs as ordered, Perform/monitor CIWA, COWS, AIMS and Fall Risk screenings as ordered, Perform wound care treatments as ordered.  Evaluation of Outcomes: Progressing   LCSW Treatment Plan for Primary Diagnosis: Schizoaffective disorder, bipolar type (HCC) Long Term Goal(s): Safe transition to appropriate next level of care at discharge, Engage patient in therapeutic group addressing interpersonal concerns.  Short Term Goals: Engage patient in aftercare planning with referrals and resources, Increase social support, Facilitate acceptance of mental health diagnosis and concerns, Facilitate patient progression through stages of change regarding substance use diagnoses and concerns, and Increase skills for wellness  and recovery  Therapeutic Interventions: Assess for all discharge needs, 1 to 1 time with Child psychotherapist, Explore available resources and support systems, Assess for adequacy in community support network, Educate family and significant other(s) on suicide prevention, Complete Psychosocial Assessment, Interpersonal group therapy.  Evaluation of Outcomes: Progressing   Progress in Treatment: Attending groups:  Yes. Participating in groups: Yes. Taking medication as prescribed: Yes. Toleration medication: Yes. Family/Significant other contact made: Yes, individual(s) contacted:  [name] (mom) [phone]  Patient understands diagnosis: Yes. Discussing patient identified problems/goals with staff: Yes. Medical problems stabilized or resolved: Yes. Denies suicidal/homicidal ideation: Yes. Issues/concerns per patient self-inventory: No. Other: NONE   New problem(s) identified: No, Describe:  None   New Short Term/Long Term Goal(s):  medication stabilization, elimination of SI thoughts, development of comprehensive mental wellness plan.    Patient Goals:  Get my medications regulated   Discharge Plan or Barriers: Patient recently admitted. CSW will continue to follow and assess for appropriate referrals and possible discharge planning.      Reason for Continuation of Hospitalization: Depression Medication stabilization Other; describe Mood Stabilization   Estimated Length of Stay: 2-3 days  Last 3 [place] Suicide Severity Risk Score: Flowsheet Row Admission (Canceled) from [facility]23/20[facility] in [facility] Department at  [facility] ED to Hosp-Admission (Current) [name] from 05/17/2023 in [facility]  C-SSRS RISK CATEGORY No Risk No Risk No Risk       Last PHQ 2/9 Scores:    11/08/2022   10:00 AM 01/14/2022    1:09 PM 09/19/2021    1:54 PM  Depression screen PHQ 2/9  Decreased Interest 0 0 0  Down, Depressed, Hopeless 0 0 0  PHQ - 2 Score 0 0 0  Altered sleeping 0 0 0  Tired, decreased energy 0 0 0  Change in appetite 0 0 0  Feeling bad or failure about yourself  0 0 0  Trouble concentrating 0 0 0  Moving slowly or fidgety/restless 0 0 0  Suicidal thoughts 0 0 0  PHQ-9 Score 0 0 0  Difficult doing work/chores   Not difficult at all    Scribe for Treatment Team: Jenkins LULLA Primer,  LCSWA 05/23/2023 1:03 PM

## 2023-05-23 NOTE — Group Note (Signed)
 Recreation Therapy Group Note   Group Topic:Problem Solving  Group Date: 05/23/2023 Start Time: 0934 End Time: 0956 Facilitators: Antwane Grose-McCall, LRT,CTRS Location: [address] Dayroom   Group Topic: Communication, Team Building, Problem Solving  Goal Area(s) Addresses:  Patient will effectively work with peer towards shared goal.  Patient will identify skills used to make activity successful.  Patient will identify how skills used during activity can be used to reach post d/c goals.   Intervention: STEM Activity  Activity: Stage Manager. In teams of 3-5, patients were given 12 plastic drinking straws and an equal length of masking tape. Using the materials provided, patients were asked to build a landing pad to catch a golf ball dropped from approximately 5 feet in the air. All materials were required to be used by the team in their design. LRT facilitated post-activity discussion.  Education: Pharmacist, Community, Scientist, Physiological, Discharge Planning   Education Outcome: Acknowledges education/In group clarification offered/Needs additional education.    Affect/Mood: Flat   Participation Level: Minimal   Participation Quality: Independent   Behavior: On-looking   Speech/Thought Process: Relevant   Insight: Fair   Judgement: Fair    Modes of Intervention: Problem-solving   Patient Response to Interventions:  Attentive   Education Outcome:  In group clarification offered    Clinical Observations/Individualized Feedback: Pt was flat but made some suggestions. Pt was more observant for the remainder of group session.      Plan: Continue to engage patient in RT group sessions 2-3x/week.   Ronald [name], LRT,CTRS 05/23/2023 12:44 PM

## 2023-05-23 NOTE — Plan of Care (Signed)
  Problem: Coping: Goal: Ability to verbalize frustrations and anger appropriately will improve Outcome: Progressing   Problem: Activity: Goal: Interest or engagement in activities will improve Outcome: Progressing   Problem: Safety: Goal: Periods of time without injury will increase Outcome: Progressing

## 2023-05-23 NOTE — Group Note (Signed)
 Date:  05/23/2023 Time:  1:01 PM  Group Topic/Focus:  Goals Group:   The focus of this group is to help patients establish daily goals to achieve during treatment and discuss how the patient can incorporate goal setting into their daily lives to aide in recovery. Orientation:   The focus of this group is to educate the patient on the purpose and policies of crisis stabilization and provide a format to answer questions about their admission.  The group details unit policies and expectations of patients while admitted.    Participation Level:  Active  Participation Quality:  Appropriate  Affect:  Appropriate  Cognitive:  Appropriate  Insight: Appropriate  Engagement in Group:  Engaged  Modes of Intervention:  Discussion, Orientation, and Rapport Building  Additional Comments:   Pt attended and participated in the Orientation and Goals group. Pt expressed frustration related to discharge. Pt receptive to the encouragement and support provided by MHT. Pt personal goals for today is to work on discharge planning, coping skills and medication regulation.   Addison HERO Hollie Bartus 05/23/2023, 1:01 PM

## 2023-05-23 NOTE — Progress Notes (Signed)
 [facility] MD Progress Note  05/23/2023 2:08 PM MASE DHONDT  MRN:  [id number]  Principal Problem: Schizoaffective disorder, bipolar type (HCC) Diagnosis: Principal Problem:   Schizoaffective disorder, bipolar type (HCC) Active Problems:   GAD (generalized anxiety disorder)   Cannabis use disorder  HPI: Patient is a 42 year old Caucasian male with prior mental health diagnoses of anxiety, cannabis use disorder, PTSD & Benzodiazepine abuse, who presented to the the [facility]) on 2/3 via law enforcement after his mother called for assistance for worsening aggressive behaviors & paranoia at home as per information obtained from his mother by provider at [facility]. As per documentation from the [facility]:   Daily notes: Jafari is seen this morning. He presents with some agitations/frustrations. He is making a good eye contact. He spoke with a loud voice. He reports, I'm saying this so you guys can understand me & my needs. I do not want to be on Zyprexa . I'm actually allergic to it. I want to be put back on Depakote . The reason I'm in the hospital is because I was not able to take my Depakote . Zyprexa  is not compatible with my health. It messes me up. I took Zyprexa  one night & it caused me to have bad dizziness & I fell & busted the left side of my face. I will no longer take Zyprexa . You doctors think you know what is right for any patient, well that is not true because I know my own body & what works for me. Lamarr currently presents agitated, angry with pressured speech. He started getting upset when a law enforcement agent served him a copy of the IVC petition that was signed for him to be brought here for treatment. Abdo was even more angry stating, I agreed to come here voluntarily because I knew that I needed to get back on my medicines. Patient currently denies any SIHI, AVH, delusional thoughts or paranoia. He does not appear to be responding to any internal stimuli.  He is attending group sessions. This case was dicussed with the attending psychiatrist. Patient's Olanzapine  has been discontinued. He is started on Depakote  ER 500 mg po bid for mood stabilization. Patient is informed & instructed that starting him on this Depakote , will need the Depakote  level drawn within 3-5 days after initiation. Patient states, he does not mind. He is currently calm & participating in the group sessions.   Total Time spent with patient: 45 minutes  Past Psychiatric History: See H & P  Past Medical History:  Past Medical History:  Diagnosis Date   Anxiety    Arthritis    Disorder of pineal gland    Post traumatic stress disorder     Past Surgical History:  Procedure Laterality Date   ANKLE SURGERY     Family History: History reviewed. No pertinent family history.  Family Psychiatric  History: See H & P  Social History:  Social History   Substance and Sexual Activity  Alcohol Use No     Social History   Substance and Sexual Activity  Drug Use Yes   Types: Marijuana   Comment: one year ago    Social History   Socioeconomic History   Marital status: Single    Spouse name: Not on file   Number of children: Not on file   Years of education: Not on file   Highest education level: Not on file  Occupational History   Not on file  Tobacco Use   Smoking status:  Never   Smokeless tobacco: Current    Types: Chew  Vaping Use   Vaping status: Never Used  Substance and Sexual Activity   Alcohol use: No   Drug use: Yes    Types: Marijuana    Comment: one year ago   Sexual activity: Not Currently  Other Topics Concern   Not on file  Social History Narrative   Not on file   Social Drivers of Health   Financial Resource Strain: Not on file  Food Insecurity: No Food Insecurity (05/20/2023)   Hunger Vital Sign    Worried About Running Out of Food in the Last Year: Never true    Ran Out of Food in the Last Year: Never true  Transportation Needs: No  Transportation Needs (05/20/2023)   PRAPARE - Administrator, Civil Service (Medical): No    Lack of Transportation (Non-Medical): No  Physical Activity: Not on file  Stress: Not on file  Social Connections: Not on file   Sleep: Good  Appetite:  Fair  Current Medications: Current Facility-Administered Medications  Medication Dose Route Frequency Provider Last Rate Last Admin   acetaminophen  (TYLENOL ) tablet 650 mg  650 mg Oral Q6H PRN Coleman, Carolyn H, NP   650 mg at 05/22/23 2101   alum & mag hydroxide-simeth (MAALOX/MYLANTA) 200-200-20 MG/5ML suspension 30 mL  30 mL Oral Q4H PRN Coleman, Carolyn H, NP   30 mL at 05/20/23 2346   divalproex  (DEPAKOTE  ER) 24 hr tablet 500 mg  500 mg Oral BID Collene Gouge I, NP       feeding supplement (ENSURE ENLIVE / ENSURE PLUS) liquid 237 mL  237 mL Oral BID BM Massengill, Rankin, MD   237 mL at 05/23/23 1134   gabapentin  (NEURONTIN ) capsule 200 mg  200 mg Oral TID Collene Gouge I, NP       magnesium  hydroxide (MILK OF MAGNESIA) suspension 30 mL  30 mL Oral Daily PRN Mardy Elveria DEL, NP   30 mL at 05/21/23 1701   melatonin tablet 10 mg  10 mg Oral QHS [name], [name], NP   10 mg at 05/22/23 2100   multivitamin with minerals tablet 1 tablet  1 tablet Oral Q breakfast [name], [name], NP   1 tablet at 05/23/23 0809   nicotine  (NICODERM CQ  - dosed in mg/24 hours) patch 14 mg  14 mg Transdermal Daily Coleman, Carolyn H, NP   14 mg at 05/23/23 9190   nicotine  polacrilex (NICORETTE ) gum 2 mg  2 mg Oral Q4H PRN [name], [name], NP   2 mg at 05/23/23 1324   OLANZapine  (ZYPREXA ) injection 10 mg  10 mg Intramuscular TID PRN Coleman, Carolyn H, NP       OLANZapine  (ZYPREXA ) injection 5 mg  5 mg Intramuscular TID PRN Coleman, Carolyn H, NP       OLANZapine  zydis (ZYPREXA ) disintegrating tablet 5 mg  5 mg Oral TID PRN Coleman, Carolyn H, NP   5 mg at 05/23/23 0321   traZODone  (DESYREL ) tablet 50 mg  50 mg Oral QHS PRN Coleman, Carolyn H, NP   50  mg at 05/22/23 2100    Lab Results:  No results found for this or any previous visit (from the past 48 hours).  Blood Alcohol level:  Lab Results  Component Value Date   [facility] <10 05/17/2023   ETH <10 05/15/2023   Metabolic Disorder Labs: Lab Results  Component Value Date   HGBA1C 4.9 05/17/2023   MPG  93.93 05/17/2023   MPG 93.93 09/29/2021   No results found for: PROLACTIN Lab Results  Component Value Date   CHOL 152 05/17/2023   TRIG 70 05/17/2023   HDL 78 05/17/2023   CHOLHDL 1.9 05/17/2023   VLDL 14 05/17/2023   LDLCALC 60 05/17/2023   LDLCALC 55 02/19/2020   Physical Findings: AIMS:0  CIWA: n/a COWS:  n/a  Musculoskeletal: Strength & Muscle Tone: within normal limits Gait & Station: normal Patient leans: N/A  Psychiatric Specialty Exam:  Presentation  General Appearance:  Casual; Disheveled  Eye Contact: Good  Speech: Clear and Coherent; Pressured (loud)  Speech Volume: Increased  Handedness: Right  Mood and Affect  Mood: Irritable; Labile  Affect: Congruent  Thought Process  Thought Processes: Coherent  Descriptions of Associations:Intact  Orientation:Full (Time, Place and Person)  Thought Content:Logical  History of Schizophrenia/Schizoaffective disorder:Yes  Duration of Psychotic Symptoms:Greater than six months  Hallucinations:Hallucinations: None Description of Auditory Hallucinations: NA   Ideas of Reference:None  Suicidal Thoughts:Suicidal Thoughts: No   Homicidal Thoughts:Homicidal Thoughts: No   Sensorium  Memory: Immediate Good; Recent Good; Remote Good  Judgment: Fair  Insight: Fair  Art Therapist  Concentration: Poor  Attention Span: Poor  Recall: Good  Fund of Knowledge: Fair  Language: Good  Psychomotor Activity  Psychomotor Activity: Psychomotor Activity: Restlessness   Assets  Assets: Communication Skills; Desire for Improvement; Housing; Resilience; Social  Support  Sleep  Sleep: Sleep: Good Number of Hours of Sleep: 7   Physical Exam: Physical Exam Vitals and nursing note reviewed.  Constitutional:      Appearance: Normal appearance.  Cardiovascular:     Rate and Rhythm: Normal rate.     Pulses: Normal pulses.  Pulmonary:     Effort: Pulmonary effort is normal.  Musculoskeletal:        General: Normal range of motion.     Cervical back: Normal range of motion.  Skin:    General: Skin is warm.  Neurological:     General: No focal deficit present.     Mental Status: He is alert and oriented to person, place, and time.   Review of Systems  Constitutional:  Negative for chills and fever.  HENT:  Negative for congestion and sore throat.   Respiratory:  Negative for cough, shortness of breath and wheezing.   Cardiovascular:  Negative for chest pain and palpitations.  Gastrointestinal:  Negative for abdominal pain, constipation, diarrhea, heartburn, nausea and vomiting.  Musculoskeletal:  Negative for joint pain and myalgias.  Neurological:  Negative for dizziness, tingling, tremors, sensory change, speech change, focal weakness, seizures, loss of consciousness, weakness and headaches.  Endo/Heme/Allergies:        Allergies: Alupent, Benadryl , Klonopin .  Psychiatric/Behavioral:  Positive for depression and substance abuse. Negative for hallucinations, memory loss and suicidal ideas. The patient is nervous/anxious and has insomnia.   All other systems reviewed and are negative.  Blood pressure 128/89, pulse 63, temperature 98.1 F (36.7 C), temperature source Oral, resp. rate 18, height 5' 11 (1.803 m), weight 69.3 kg, SpO2 98%. Body mass index is 21.31 kg/m.  Treatment Plan Summary: Daily contact with patient to assess and evaluate symptoms and progress in treatment and Medication management   Safety and Monitoring: Voluntary admission to inpatient psychiatric unit for safety, stabilization and treatment Daily contact with  patient to assess and evaluate symptoms and progress in treatment Patient's case to be discussed in multi-disciplinary team meeting Observation Level : q15 minute checks Vital signs: q12 hours Precautions:  Safety   Long Term Goal(s): Improvement in symptoms so as ready for discharge   Short Term Goals: Ability to identify changes in lifestyle to reduce recurrence of condition will improve, Ability to verbalize feelings will improve, Ability to disclose and discuss suicidal ideas, Ability to demonstrate self-control will improve, Ability to identify and develop effective coping behaviors will improve, Ability to maintain clinical measurements within normal limits will improve, Compliance with prescribed medications will improve, and Ability to identify triggers associated with substance abuse/mental health issues will improve   Diagnoses Principal Problem:   Schizoaffective disorder, bipolar type (HCC) Active Problems:   GAD (generalized anxiety disorder)   Cannabis use disorder   Medications -Discontinued Zyprexa  5 mg in the mornings for psychosis -Discontinued Zyprexa  10 mg nightly for psychosis and mood stabilization -Continue nicotine  patch 14 mg daily for nicotine  dependence -Continue melatonin 10 mg nightly for sleep -Continue Ensure twice daily in between meals for nutritional supplementation -Continue multivitamins daily for nutritional supplementation.  -Initiated Depakote  ER 500 mg po bid for mood stabilization.   PRNS -Continue agitation protocol medications as per the [facility] -Continue Nicorette  gum as needed every 4 hours for nicotine  dependence -Continue trazodone  50 mg nightly as needed for sleep -Continue Tylenol  650 mg every 6 hours PRN for mild pain -Continue Maalox 30 mg every 4 hrs PRN for indigestion -Continue Milk of Magnesia as needed every 6 hrs for constipation    Discharge Planning: Social work and case management to assist with discharge planning and  identification of hospital follow-up needs prior to discharge Estimated LOS: 5-7 days Discharge Concerns: Need to establish a safety plan; Medication compliance and effectiveness Discharge Goals: Return home with outpatient referrals for mental health follow-up including medication management/psychotherapy   I certify that inpatient services furnished can reasonably be expected to improve the patient's condition.    Mac Bolster, NP, pmhnp, fnp-bc. 05/23/2023, 2:08 PM Patient ID: Lamar JAYSON Michaels, male   DOB: 07-27-1980, 43 y.o.   MRN: [id number] Patient ID: AMARRION PASTORINO, male   DOB: Dec 21, 1980, 44 y.o.   MRN: [id number] Patient ID: ROHIL LESCH, male   DOB: June 01, 1980, 43 y.o.   MRN: [id number]

## 2023-05-23 NOTE — Progress Notes (Signed)
 Patient has been coming back and forth to nursing requesting that he get the same medication for sleep that he got last night. Patient was reassured that he had received the same medication. Patient continued asking for additional snacks, activities in dayroom, and benadryl  or hydroxyzine . Snack was provided. Nurse informed patient that those medications were listed as allergies. Patient became increasingly more irritable and easily annoyed with each request. Patient went to his room and started rocking while sitting on his bed making crying sounds, but no tears were present. Zyprexa  5mg  po given. Will continue to monitor and follow plan of care.

## 2023-05-23 NOTE — Group Note (Signed)
 Date:  05/23/2023 Time:  5:07 PM  Group Topic/Focus:  Dimensions of Wellness:   The focus of this group is to introduce the topic of wellness and discuss the role each dimension of wellness plays in total health.    Participation Level:  Active  Participation Quality:  Appropriate  Affect:  Appropriate  Cognitive:  Appropriate  Insight: Appropriate  Engagement in Group:  Engaged  Modes of Intervention:  Activity and Socialization  Additional Comments:   Pt attended and actively participated in the Social Wellness group. Pt practiced active listening, problem solving and teamwork to complete the social wellness activity.  Ronald [name] 05/23/2023, 5:07 PM

## 2023-05-24 NOTE — Plan of Care (Signed)
 Problem: Education: Goal: Knowledge of [facility] Education information/materials will improve Outcome: Progressing Goal: Emotional status will improve Outcome: Progressing Goal: Mental status will improve Outcome: Progressing Goal: Verbalization of understanding the information provided will improve Outcome: Progressing   Problem: Activity: Goal: Interest or engagement in activities will improve Outcome: Progressing Goal: Sleeping patterns will improve Outcome: Progressing   Problem: Coping: Goal: Ability to verbalize frustrations and anger appropriately will improve Outcome: Progressing Goal: Ability to demonstrate self-control will improve Outcome: Progressing   Problem: Health Behavior/Discharge Planning: Goal: Identification of resources available to assist in meeting health care needs will improve Outcome: Progressing Goal: Compliance with treatment plan for underlying cause of condition will improve Outcome: Progressing   Problem: Physical Regulation: Goal: Ability to maintain clinical measurements within normal limits will improve Outcome: Progressing   Problem: Safety: Goal: Periods of time without injury will increase Outcome: Progressing   Problem: Education: Goal: Knowledge of [facility] Education information/materials will improve Outcome: Progressing Goal: Emotional status will improve Outcome: Progressing Goal: Mental status will improve Outcome: Progressing Goal: Verbalization of understanding the information provided will improve Outcome: Progressing   Problem: Activity: Goal: Interest or engagement in activities will improve Outcome: Progressing Goal: Sleeping patterns will improve Outcome: Progressing   Problem: Coping: Goal: Ability to verbalize frustrations and anger appropriately will improve Outcome: Progressing Goal: Ability to demonstrate self-control will improve Outcome: Progressing   Problem: Health  Behavior/Discharge Planning: Goal: Identification of resources available to assist in meeting health care needs will improve Outcome: Progressing Goal: Compliance with treatment plan for underlying cause of condition will improve Outcome: Progressing   Problem: Physical Regulation: Goal: Ability to maintain clinical measurements within normal limits will improve Outcome: Progressing   Problem: Safety: Goal: Periods of time without injury will increase Outcome: Progressing   Problem: Education: Goal: Utilization of techniques to improve thought processes will improve Outcome: Progressing Goal: Knowledge of the prescribed therapeutic regimen will improve Outcome: Progressing   Problem: Activity: Goal: Interest or engagement in leisure activities will improve Outcome: Progressing Goal: Imbalance in normal sleep/wake cycle will improve Outcome: Progressing   Problem: Coping: Goal: Coping ability will improve Outcome: Progressing Goal: Will verbalize feelings Outcome: Progressing   Problem: Health Behavior/Discharge Planning: Goal: Ability to make decisions will improve Outcome: Progressing Goal: Compliance with therapeutic regimen will improve Outcome: Progressing   Problem: Role Relationship: Goal: Will demonstrate positive changes in social behaviors and relationships Outcome: Progressing   Problem: Safety: Goal: Ability to disclose and discuss suicidal ideas will improve Outcome: Progressing Goal: Ability to identify and utilize support systems that promote safety will improve Outcome: Progressing   Problem: Self-Concept: Goal: Will verbalize positive feelings about self Outcome: Progressing Goal: Level of anxiety will decrease Outcome: Progressing   Problem: Activity: Goal: Will identify at least one activity in which they can participate Outcome: Progressing   Problem: Coping: Goal: Ability to identify and develop effective coping behavior will  improve Outcome: Progressing Goal: Ability to interact with others will improve Outcome: Progressing Goal: Demonstration of participation in decision-making regarding own care will improve Outcome: Progressing Goal: Ability to use eye contact when communicating with others will improve Outcome: Progressing   Problem: Health Behavior/Discharge Planning: Goal: Identification of resources available to assist in meeting health care needs will improve Outcome: Progressing   Problem: Self-Concept: Goal: Will verbalize positive feelings about self Outcome: Progressing   Problem: Activity: Goal: Will verbalize the importance of balancing activity with adequate rest periods Outcome: Progressing   Problem:  Education: Goal: Will be free of psychotic symptoms Outcome: Progressing Goal: Knowledge of the prescribed therapeutic regimen will improve Outcome: Progressing   Problem: Coping: Goal: Coping ability will improve Outcome: Progressing Goal: Will verbalize feelings Outcome: Progressing

## 2023-05-24 NOTE — Group Note (Signed)
 Date:  05/24/2023 Time:  9:00 PM  Group Topic/Focus:  Wrap-Up Group:   The focus of this group is to help patients review their daily goal of treatment and discuss progress on daily workbooks.    Participation Level:  Active  Participation Quality:  Appropriate and Attentive  Affect:  Appropriate  Cognitive:  Alert and Appropriate  Insight: Appropriate and Good  Engagement in Group:  Engaged  Modes of Intervention:  Discussion and Education  Additional Comments:  Pt attended and participated in wrap up group this evening and rated their day a 10/10. Pt stated that they are enjoying the visits that have been arranged for their mother to visit in the morning. Pt is feeling better about their med changes and they are finding new coping skills. Pt is anticipating their D/C on Tuesday.   Sabena Winner A Leandrew Keech 05/24/2023, 9:00 PM

## 2023-05-24 NOTE — Progress Notes (Signed)
 [facility] MD Progress Note  05/24/2023 2:57 PM Ronald [name]  MRN:  [id number]  Principal Problem: Schizoaffective disorder, bipolar type (HCC) Diagnosis: Principal Problem:   Schizoaffective disorder, bipolar type (HCC) Active Problems:   GAD (generalized anxiety disorder)   Cannabis use disorder  HPI: Patient is a 43 year old Caucasian male with prior mental health diagnoses of anxiety, cannabis use disorder, PTSD & Benzodiazepine abuse, who presented to the the [facility] on 2/3 via law enforcement after his mother called for assistance for worsening aggressive behaviors & paranoia at home as per information obtained from his mother by provider at [facility]. As per documentation from the [facility]:   Daily notes: Ronald [name] is seen this morning. Chart reviewed. The chart findings discussed with the treatment team. He presents alert, oriented & aware of situation. His affect is bright. He reports, I feel great. I feel so much better today. I slept well last night. Depakote  has always done the magic number for me. I thank you guys for listening to me. I can't wait to see my mom & my dog named (sweet trouble). Ronald [name] appears to be showing some improved symptoms. He is informed that we must have his Depakote  levels checked on Tuesday 05-27-23. The decision to discharge or not will rest on the Depakote  level this coming Tuesday. Issachar states, that will not be a problem for him. He is currently calm & participating in the group sessions. He currently denies any SIHI, AVH, delusional thoughts or paranoia. He does not appear to be responding to any internal stimuli. Continue current plan of care as already in progress.  Total Time spent with patient: 45 minutes  Past Psychiatric History: See H & P  Past Medical History:  Past Medical History:  Diagnosis Date   Anxiety    Arthritis    Disorder of pineal gland    Post traumatic stress disorder     Past Surgical History:   Procedure Laterality Date   ANKLE SURGERY     Family History: History reviewed. No pertinent family history.  Family Psychiatric  History: See H & P  Social History:  Social History   Substance and Sexual Activity  Alcohol Use No     Social History   Substance and Sexual Activity  Drug Use Yes   Types: Marijuana   Comment: one year ago    Social History   Socioeconomic History   Marital status: Single    Spouse name: Not on file   Number of children: Not on file   Years of education: Not on file   Highest education level: Not on file  Occupational History   Not on file  Tobacco Use   Smoking status: Never   Smokeless tobacco: Current    Types: Chew  Vaping Use   Vaping status: Never Used  Substance and Sexual Activity   Alcohol use: No   Drug use: Yes    Types: Marijuana    Comment: one year ago   Sexual activity: Not Currently  Other Topics Concern   Not on file  Social History Narrative   Not on file   Social Drivers of Health   Financial Resource Strain: Not on file  Food Insecurity: No Food Insecurity (05/20/2023)   Hunger Vital Sign    Worried About Running Out of Food in the Last Year: Never true    Ran Out of Food in the Last Year: Never true  Transportation Needs: No Transportation Needs (05/20/2023)  PRAPARE - Administrator, Civil Service (Medical): No    Lack of Transportation (Non-Medical): No  Physical Activity: Not on file  Stress: Not on file  Social Connections: Not on file   Sleep: Good  Appetite:  Fair  Current Medications: Current Facility-Administered Medications  Medication Dose Route Frequency Provider Last Rate Last Admin   acetaminophen  (TYLENOL ) tablet 650 mg  650 mg Oral Q6H PRN Coleman, Carolyn H, NP   650 mg at 05/23/23 2129   alum & mag hydroxide-simeth (MAALOX/MYLANTA) 200-200-20 MG/5ML suspension 30 mL  30 mL Oral Q4H PRN Coleman, Carolyn H, NP   30 mL at 05/20/23 2346   divalproex  (DEPAKOTE  ER) 24 hr  tablet 500 mg  500 mg Oral BID Collene Gouge I, NP   500 mg at 05/24/23 0756   feeding supplement (ENSURE ENLIVE / ENSURE PLUS) liquid 237 mL  237 mL Oral BID BM Massengill, Rankin, MD   237 mL at 05/24/23 1054   gabapentin  (NEURONTIN ) capsule 200 mg  200 mg Oral TID Collene Gouge I, NP   200 mg at 05/24/23 1332   magnesium  hydroxide (MILK OF MAGNESIA) suspension 30 mL  30 mL Oral Daily PRN Mardy Elveria DEL, NP   30 mL at 05/21/23 1701   melatonin tablet 10 mg  10 mg Oral QHS [name], [name], NP   10 mg at 05/23/23 2128   multivitamin with minerals tablet 1 tablet  1 tablet Oral Q breakfast [name], [name], NP   1 tablet at 05/24/23 0756   nicotine  (NICODERM CQ  - dosed in mg/24 hours) patch 14 mg  14 mg Transdermal Daily Coleman, Carolyn H, NP   14 mg at 05/24/23 0756   nicotine  polacrilex (NICORETTE ) gum 2 mg  2 mg Oral Q4H PRN [name], [name], NP   2 mg at 05/24/23 1054   OLANZapine  (ZYPREXA ) injection 10 mg  10 mg Intramuscular TID PRN Mardy Elveria DEL, NP       OLANZapine  (ZYPREXA ) injection 5 mg  5 mg Intramuscular TID PRN Coleman, Carolyn H, NP       OLANZapine  zydis (ZYPREXA ) disintegrating tablet 5 mg  5 mg Oral TID PRN Coleman, Carolyn H, NP   5 mg at 05/23/23 0321   traZODone  (DESYREL ) tablet 50 mg  50 mg Oral QHS PRN Coleman, Carolyn H, NP   50 mg at 05/23/23 2128    Lab Results:  No results found for this or any previous visit (from the past 48 hours).  Blood Alcohol level:  Lab Results  Component Value Date   ETH <10 05/17/2023   ETH <10 05/15/2023   Metabolic Disorder Labs: Lab Results  Component Value Date   HGBA1C 4.9 05/17/2023   MPG 93.93 05/17/2023   MPG 93.93 09/29/2021   No results found for: PROLACTIN Lab Results  Component Value Date   CHOL 152 05/17/2023   TRIG 70 05/17/2023   HDL 78 05/17/2023   CHOLHDL 1.9 05/17/2023   VLDL 14 05/17/2023   LDLCALC 60 05/17/2023   LDLCALC 55 02/19/2020   Physical Findings: AIMS:0  CIWA: n/a COWS:   n/a  Musculoskeletal: Strength & Muscle Tone: within normal limits Gait & Station: normal Patient leans: N/A  Psychiatric Specialty Exam:  Presentation  General Appearance:  Casual; Disheveled  Eye Contact: Good  Speech: Clear and Coherent; Pressured (loud)  Speech Volume: Increased  Handedness: Right  Mood and Affect  Mood: Irritable; Labile  Affect: Congruent  Thought Process  Thought  Processes: Coherent  Descriptions of Associations:Intact  Orientation:Full (Time, Place and Person)  Thought Content:Logical  History of Schizophrenia/Schizoaffective disorder:Yes  Duration of Psychotic Symptoms:Greater than six months  Hallucinations:Hallucinations: None Description of Auditory Hallucinations: NA   Ideas of Reference:None  Suicidal Thoughts:Suicidal Thoughts: No   Homicidal Thoughts:Homicidal Thoughts: No   Sensorium  Memory: Immediate Good; Recent Good; Remote Good  Judgment: Fair  Insight: Fair  Art Therapist  Concentration: Poor  Attention Span: Poor  Recall: Good  Fund of Knowledge: Fair  Language: Good  Psychomotor Activity  Psychomotor Activity: Psychomotor Activity: Restlessness   Assets  Assets: Communication Skills; Desire for Improvement; Housing; Resilience; Social Support  Sleep  Sleep: Sleep: Good Number of Hours of Sleep: 7   Physical Exam: Physical Exam Vitals and nursing note reviewed.  Constitutional:      Appearance: Normal appearance.  Cardiovascular:     Rate and Rhythm: Normal rate.     Pulses: Normal pulses.  Pulmonary:     Effort: Pulmonary effort is normal.  Musculoskeletal:        General: Normal range of motion.     Cervical back: Normal range of motion.  Skin:    General: Skin is warm.  Neurological:     General: No focal deficit present.     Mental Status: He is alert and oriented to person, place, and time.    Review of Systems  Constitutional:  Negative for  chills and fever.  HENT:  Negative for congestion and sore throat.   Respiratory:  Negative for cough, shortness of breath and wheezing.   Cardiovascular:  Negative for chest pain and palpitations.  Gastrointestinal:  Negative for abdominal pain, constipation, diarrhea, heartburn, nausea and vomiting.  Musculoskeletal:  Negative for joint pain and myalgias.  Neurological:  Negative for dizziness, tingling, tremors, sensory change, speech change, focal weakness, seizures, loss of consciousness, weakness and headaches.  Endo/Heme/Allergies:        Allergies: Alupent, Benadryl , Klonopin .  Psychiatric/Behavioral:  Positive for depression and substance abuse. Negative for hallucinations, memory loss and suicidal ideas. The patient is nervous/anxious and has insomnia.   All other systems reviewed and are negative.  Blood pressure (!) 119/92, pulse 72, temperature (!) 97.5 F (36.4 C), temperature source Oral, resp. rate 18, height 5' 11 (1.803 m), weight 69.3 kg, SpO2 100%. Body mass index is 21.31 kg/m.  Treatment Plan Summary: Daily contact with patient to assess and evaluate symptoms and progress in treatment and Medication management   Safety and Monitoring: Voluntary admission to inpatient psychiatric unit for safety, stabilization and treatment Daily contact with patient to assess and evaluate symptoms and progress in treatment Patient's case to be discussed in multi-disciplinary team meeting Observation Level : q15 minute checks Vital signs: q12 hours Precautions: Safety    Diagnoses Principal Problem:   Schizoaffective disorder, bipolar type (HCC) Active Problems:   GAD (generalized anxiety disorder)   Cannabis use disorder   Medications -Discontinued Zyprexa  5 mg in the mornings for psychosis -Discontinued Zyprexa  10 mg nightly for psychosis and mood stabilization -Continue nicotine  patch 14 mg daily for nicotine  dependence -Continue melatonin 10 mg nightly for  sleep -Continue Ensure twice daily in between meals for nutritional supplementation -Continue multivitamins daily for nutritional supplementation.  -Continue Depakote  ER 500 mg po bid for mood stabilization.  -Depakote  level to be obtained 05-27-23.   PRNS -Continue agitation protocol medications as per the [facility] -Continue Nicorette  gum as needed every 4 hours for nicotine  dependence -Continue trazodone  50 mg  nightly as needed for sleep -Continue Tylenol  650 mg every 6 hours PRN for mild pain -Continue Maalox 30 mg every 4 hrs PRN for indigestion -Continue Milk of Magnesia as needed every 6 hrs for constipation    Discharge Planning: Social work and case management to assist with discharge planning and identification of hospital follow-up needs prior to discharge Estimated LOS: 5-7 days Discharge Concerns: Need to establish a safety plan; Medication compliance and effectiveness Discharge Goals: Return home with outpatient referrals for mental health follow-up including medication management/psychotherapy   I certify that inpatient services furnished can reasonably be expected to improve the patient's condition.    Mac Bolster, NP, pmhnp, fnp-bc. 05/24/2023, 2:57 PM Patient ID: Ronald [name], male   DOB: June 03, 1980, 43 y.o.   MRN: [id number] Patient ID: Ronald [name], male   DOB: 1981-01-25, 43 y.o.   MRN: [id number] Patient ID: Ronald [name], male   DOB: 1980-12-06, 43 y.o.   MRN: [id number] Patient ID: Ronald [name], male   DOB: [birth year], 43 y.o.   MRN: [id number]

## 2023-05-24 NOTE — [facility] Group Notes (Signed)
 [facility] LCSW Group Therapy Note  Date/Time:  1:30pm- 2:30 pm  Type of Therapy and Topic:  Group Therapy:  Self-Soothing  Participation Level:  Active   Description of Group This process group involved a discussion with and between patients about self-soothing.   The difference between healthy and unhealthy coping skills was described, then examples were elicited from group members for healthy and unhealthy self-soothing techniques.  This then was followed by a discussion about self-soothing, what it is, how important it is, and why we choose the coping techniques we choose.  Participants were encouraged to think about self-soothing as necessary and positive.  Therapeutic Goals Patient will identify and describe one healthy and one unhealthy self-soothing technique they often use Patient will participate in generating ideas about healthy self-soothing options for both during this hospital stay and when they return to the community Patients will be supportive of one another and receive support from others Patients will understand the need all humans have to self-soothe  Summary of Patient Progress:  The patient expressed that he self sooth by using the five sense: Smell: Herbs, lavender,  Touch: pet my dog Taste: fresh juice- goof food Sight: seeing people smile Hear: laughter and music,   Patient stated that all these things help him regulate the energy in his body    Therapeutic Modalities Brief Solution-Focused Therapy Psychoeducation

## 2023-05-24 NOTE — [facility] Group Notes (Signed)
 [facility] Group Notes:  (Nursing/MHT/Case Management/Adjunct)  Date:  05/24/2023  Time:  9:27 AM  Type of Therapy:   Goals  Participation Level:  Active  Participation Quality:  Appropriate  Affect:  Appropriate  Cognitive:  Appropriate  Insight:  Good  Engagement in Group:  Engaged  Modes of Intervention:  Discussion, Orientation, and Socialization  Summary of Progress/Problems:  Ronald [name] 05/24/2023, 9:27 AM

## 2023-05-24 NOTE — Progress Notes (Signed)
   05/23/23 2030  Psych Admission Type (Psych Patients Only)  Admission Status Involuntary  Psychosocial Assessment  Patient Complaints None  Eye Contact Fair  Facial Expression Pensive  Affect Appropriate to circumstance  Speech Logical/coherent  Interaction Assertive  Motor Activity Slow  Appearance/Hygiene Unremarkable  Behavior Characteristics Cooperative;Appropriate to situation  Mood Pleasant  Thought Process  Coherency WDL  Content WDL  Delusions None reported or observed  Perception WDL  Hallucination None reported or observed  Judgment Poor  Confusion None  Danger to Self  Current suicidal ideation? Denies  Agreement Not to Harm Self Yes  Description of Agreement Verbal  Danger to Others  Danger to Others None reported or observed

## 2023-05-24 NOTE — Progress Notes (Signed)
   05/24/23 1100  Psych Admission Type (Psych Patients Only)  Admission Status Involuntary  Psychosocial Assessment  Patient Complaints None  Eye Contact Fair  Facial Expression Animated  Affect Appropriate to circumstance  Speech Logical/coherent  Interaction Assertive  Motor Activity Slow  Appearance/Hygiene Unremarkable  Behavior Characteristics Cooperative  Mood Pleasant  Thought Process  Coherency WDL  Content WDL  Delusions None reported or observed  Perception WDL  Hallucination None reported or observed  Judgment Limited  Confusion None  Danger to Self  Current suicidal ideation? Denies  Danger to Others  Danger to Others None reported or observed

## 2023-05-24 NOTE — [facility] Group Notes (Signed)
 Pt participated in group activity

## 2023-05-25 NOTE — [facility] Group Notes (Signed)
 [facility] Group Notes:  (Nursing/MHT/Case Management/Adjunct)  Date:  05/25/2023  Time:  8:56 PM  Type of Therapy:   Wrap up group  Participation Level:  Active  Participation Quality:  Appropriate  Affect:  Appropriate  Cognitive:  Appropriate  Insight:  Appropriate  Engagement in Group:  Engaged  Modes of Intervention:  Education  Summary of Progress/Problems:Med management. Rated day 10/10.  Ronald [name] 05/25/2023, 8:56 PM

## 2023-05-25 NOTE — Plan of Care (Signed)
   Problem: Education: Goal: Knowledge of [facility] Education information/materials will improve Outcome: Progressing   Problem: Activity: Goal: Interest or engagement in activities will improve Outcome: Progressing   Problem: Coping: Goal: Ability to verbalize frustrations and anger appropriately will improve Outcome: Progressing

## 2023-05-25 NOTE — Plan of Care (Addendum)
 Pt presents with an animated expression and irritable affect but remains cooperative. Pt complained of anxiety and irritability regarding the enforcement of masks being worn in the day room but was able to be redirected with verbal de escalation. Reports good sleep and appetite. Denies SI, HI, AVH, and pain. Pt was observed in the day room throughout the day, engaging in group and interacting with peers. Medication compliant with no adverse reactions. Safety checks maintained at q 15 minutes. Support, encouragement, and reassurance offered to the pt.   Problem: Education: Goal: Mental status will improve Outcome: Progressing   Problem: Activity: Goal: Interest or engagement in activities will improve Outcome: Progressing   Problem: Safety: Goal: Periods of time without injury will increase Outcome: Progressing   Problem: Education: Goal: Emotional status will improve Outcome: Progressing

## 2023-05-25 NOTE — Progress Notes (Signed)
 [facility] MD Progress Note  05/25/2023 2:54 PM PRESTYN STANCO  MRN:  [id number]  Principal Problem: Schizoaffective disorder, bipolar type (HCC) Diagnosis: Principal Problem:   Schizoaffective disorder, bipolar type (HCC) Active Problems:   GAD (generalized anxiety disorder)   Cannabis use disorder  HPI: Patient is a 43 year old Caucasian male with prior mental health diagnoses of anxiety, cannabis use disorder, PTSD & Benzodiazepine abuse, who presented to the the [facility]) on 2/3 via law enforcement after his mother called for assistance for worsening aggressive behaviors & paranoia at home as per information obtained from his mother by provider at [facility]. As per documentation from the [facility]:   Daily notes: Chaunce is seen. Chart reviewed. The chart findings discussed with the treatment team. He presents alert, oriented & aware of situation. His affect is good. He reports, I'm doing well.  My mood is even, calm & relaxed. My energy level is up again. I slept well last night. I feel so much better, no anxiety O depression. Jamus remains civil & participating in the group sessions. He currently denies any SIHI, AVH, delusional thoughts or paranoia. He does not appear to be responding to any internal stimuli. His Depakote  level will be drawn on 05-27-23. Continue current plan of care as already in progress. There are no changes made.   Total Time spent with patient:  35 minutes  Past Psychiatric History: See H & P  Past Medical History:  Past Medical History:  Diagnosis Date   Anxiety    Arthritis    Disorder of pineal gland    Post traumatic stress disorder     Past Surgical History:  Procedure Laterality Date   ANKLE SURGERY     Family History: History reviewed. No pertinent family history.  Family Psychiatric  History: See H & P  Social History:  Social History   Substance and Sexual Activity  Alcohol Use No     Social History   Substance  and Sexual Activity  Drug Use Yes   Types: Marijuana   Comment: one year ago    Social History   Socioeconomic History   Marital status: Single    Spouse name: Not on file   Number of children: Not on file   Years of education: Not on file   Highest education level: Not on file  Occupational History   Not on file  Tobacco Use   Smoking status: Never   Smokeless tobacco: Current    Types: Chew  Vaping Use   Vaping status: Never Used  Substance and Sexual Activity   Alcohol use: No   Drug use: Yes    Types: Marijuana    Comment: one year ago   Sexual activity: Not Currently  Other Topics Concern   Not on file  Social History Narrative   Not on file   Social Drivers of Health   Financial Resource Strain: Not on file  Food Insecurity: No Food Insecurity (05/20/2023)   Hunger Vital Sign    Worried About Running Out of Food in the Last Year: Never true    Ran Out of Food in the Last Year: Never true  Transportation Needs: No Transportation Needs (05/20/2023)   PRAPARE - Administrator, Civil Service (Medical): No    Lack of Transportation (Non-Medical): No  Physical Activity: Not on file  Stress: Not on file  Social Connections: Not on file   Sleep: Good  Appetite:  Fair  Current Medications:  Current Facility-Administered Medications  Medication Dose Route Frequency Provider Last Rate Last Admin   acetaminophen  (TYLENOL ) tablet 650 mg  650 mg Oral Q6H PRN Coleman, Carolyn H, NP   650 mg at 05/25/23 0208   alum & mag hydroxide-simeth (MAALOX/MYLANTA) 200-200-20 MG/5ML suspension 30 mL  30 mL Oral Q4H PRN Coleman, Carolyn H, NP   30 mL at 05/20/23 2346   divalproex  (DEPAKOTE  ER) 24 hr tablet 500 mg  500 mg Oral BID Collene Gouge I, NP   500 mg at 05/25/23 0801   feeding supplement (ENSURE ENLIVE / ENSURE PLUS) liquid 237 mL  237 mL Oral BID BM Massengill, Rankin, MD   237 mL at 05/25/23 1100   gabapentin  (NEURONTIN ) capsule 200 mg  200 mg Oral TID Collene Gouge  I, NP   200 mg at 05/25/23 1109   magnesium  hydroxide (MILK OF MAGNESIA) suspension 30 mL  30 mL Oral Daily PRN Mardy Elveria DEL, NP   30 mL at 05/21/23 1701   melatonin tablet 10 mg  10 mg Oral QHS [name], [name], NP   10 mg at 05/24/23 2103   multivitamin with minerals tablet 1 tablet  1 tablet Oral Q breakfast [name], [name], NP   1 tablet at 05/25/23 0800   nicotine  (NICODERM CQ  - dosed in mg/24 hours) patch 14 mg  14 mg Transdermal Daily Coleman, Carolyn H, NP   14 mg at 05/25/23 0800   nicotine  polacrilex (NICORETTE ) gum 2 mg  2 mg Oral Q4H PRN [name], [name], NP   2 mg at 05/25/23 1107   OLANZapine  (ZYPREXA ) injection 10 mg  10 mg Intramuscular TID PRN Mardy Elveria DEL, NP       OLANZapine  (ZYPREXA ) injection 5 mg  5 mg Intramuscular TID PRN Coleman, Carolyn H, NP       OLANZapine  zydis (ZYPREXA ) disintegrating tablet 5 mg  5 mg Oral TID PRN Coleman, Carolyn H, NP   5 mg at 05/23/23 0321   traZODone  (DESYREL ) tablet 50 mg  50 mg Oral QHS PRN Coleman, Carolyn H, NP   50 mg at 05/24/23 2105    Lab Results:  No results found for this or any previous visit (from the past 48 hours).  Blood Alcohol level:  Lab Results  Component Value Date   ETH <10 05/17/2023   ETH <10 05/15/2023   Metabolic Disorder Labs: Lab Results  Component Value Date   HGBA1C 4.9 05/17/2023   MPG 93.93 05/17/2023   MPG 93.93 09/29/2021   No results found for: PROLACTIN Lab Results  Component Value Date   CHOL 152 05/17/2023   TRIG 70 05/17/2023   HDL 78 05/17/2023   CHOLHDL 1.9 05/17/2023   VLDL 14 05/17/2023   LDLCALC 60 05/17/2023   LDLCALC 55 02/19/2020   Physical Findings: AIMS:0  CIWA: n/a COWS:  n/a  Musculoskeletal: Strength & Muscle Tone: within normal limits Gait & Station: normal Patient leans: N/A  Psychiatric Specialty Exam:  Presentation  General Appearance:  Appropriate for Environment; Casual; Fairly Groomed  Eye Contact: Good  Speech: Clear and  Coherent; Normal Rate  Speech Volume: Normal  Handedness: Right  Mood and Affect  Mood: -- (Improving.)  Affect: Congruent  Thought Process  Thought Processes: Coherent; Linear; Goal Directed  Descriptions of Associations:Intact  Orientation:Full (Time, Place and Person)  Thought Content:Logical  History of Schizophrenia/Schizoaffective disorder:Yes  Duration of Psychotic Symptoms:Greater than six months  Hallucinations:Hallucinations: None Description of Auditory Hallucinations: NA    Ideas  of Reference:None  Suicidal Thoughts:Suicidal Thoughts: No    Homicidal Thoughts:Homicidal Thoughts: No    Sensorium  Memory: Recent Good; Immediate Good; Remote Good  Judgment: Good  Insight: Present  Executive Functions  Concentration: Good  Attention Span: Good  Recall: Good  Fund of Knowledge: Fair  Language: Good  Psychomotor Activity  Psychomotor Activity: Psychomotor Activity: Normal    Assets  Assets: Communication Skills; Desire for Improvement; Housing; Physical Health; Resilience; Social Support  Sleep  Sleep: Sleep: Good Number of Hours of Sleep: 7    Physical Exam: Physical Exam Vitals and nursing note reviewed.  Constitutional:      Appearance: Normal appearance.  Cardiovascular:     Rate and Rhythm: Normal rate.     Pulses: Normal pulses.  Pulmonary:     Effort: Pulmonary effort is normal.  Musculoskeletal:        General: Normal range of motion.     Cervical back: Normal range of motion.  Skin:    General: Skin is warm.  Neurological:     General: No focal deficit present.     Mental Status: He is alert and oriented to person, place, and time.    Review of Systems  Constitutional:  Negative for chills and fever.  HENT:  Negative for congestion and sore throat.   Respiratory:  Negative for cough, shortness of breath and wheezing.   Cardiovascular:  Negative for chest pain and palpitations.   Gastrointestinal:  Negative for abdominal pain, constipation, diarrhea, heartburn, nausea and vomiting.  Musculoskeletal:  Negative for joint pain and myalgias.  Neurological:  Negative for dizziness, tingling, tremors, sensory change, speech change, focal weakness, seizures, loss of consciousness, weakness and headaches.  Endo/Heme/Allergies:        Allergies: Alupent, Benadryl , Klonopin .  Psychiatric/Behavioral:  Positive for depression and substance abuse. Negative for hallucinations, memory loss and suicidal ideas. The patient is nervous/anxious and has insomnia.   All other systems reviewed and are negative.  Blood pressure (!) 125/96, pulse 73, temperature 98.2 F (36.8 C), temperature source Oral, resp. rate 12, height 5' 11 (1.803 m), weight 69.3 kg, SpO2 99%. Body mass index is 21.31 kg/m.  Treatment Plan Summary: Daily contact with patient to assess and evaluate symptoms and progress in treatment and Medication management   Safety and Monitoring: Voluntary admission to inpatient psychiatric unit for safety, stabilization and treatment Daily contact with patient to assess and evaluate symptoms and progress in treatment Patient's case to be discussed in multi-disciplinary team meeting Observation Level : q15 minute checks Vital signs: q12 hours Precautions: Safety    Diagnoses Principal Problem:   Schizoaffective disorder, bipolar type (HCC) Active Problems:   GAD (generalized anxiety disorder)   Cannabis use disorder   Medications -Discontinued Zyprexa  5 mg in the mornings for psychosis -Discontinued Zyprexa  10 mg nightly for psychosis and mood stabilization -Continue nicotine  patch 14 mg daily for nicotine  dependence -Continue melatonin 10 mg nightly for sleep -Continue Ensure twice daily in between meals for nutritional supplementation -Continue multivitamins daily for nutritional supplementation.  -Continue Depakote  ER 500 mg po bid for mood stabilization.   -Depakote  level to be obtained 05-27-23.   PRNS -Continue agitation protocol medications as per the [facility] -Continue Nicorette  gum as needed every 4 hours for nicotine  dependence -Continue trazodone  50 mg nightly as needed for sleep -Continue Tylenol  650 mg every 6 hours PRN for mild pain -Continue Maalox 30 mg every 4 hrs PRN for indigestion -Continue Milk of Magnesia as needed every 6  hrs for constipation    Discharge Planning: Social work and case management to assist with discharge planning and identification of hospital follow-up needs prior to discharge Estimated LOS: 5-7 days Discharge Concerns: Need to establish a safety plan; Medication compliance and effectiveness Discharge Goals: Return home with outpatient referrals for mental health follow-up including medication management/psychotherapy   I certify that inpatient services furnished can reasonably be expected to improve the patient's condition.    Mac Bolster, NP, pmhnp, fnp-bc. 05/25/2023, 2:54 PM Patient ID: JAHON BART, male   DOB: 1980/10/13, 43 y.o.   MRN: [id number] Patient ID: NICKALOUS STINGLEY, male   DOB: [birth year], 43 y.o.   MRN: [id number] Patient ID: ALTON BOUKNIGHT, male   DOB: 1981/01/23, 43 y.o.   MRN: [id number] Patient ID: LADARRIOUS KIRKSEY, male   DOB: 1980-09-23, 43 y.o.   MRN: [id number] Patient ID: DIMITRIOS BALESTRIERI, male   DOB: 1980/06/07, 43 y.o.   MRN: [id number]

## 2023-05-25 NOTE — Group Note (Signed)
 Date:  05/26/2023 Time:  11:32 AM  Group Topic/Focus:  Emotional Education:   The focus of this group is to discuss what feelings/emotions are, and how they are experienced. Managing Feelings:   The focus of this group is to identify what feelings patients have difficulty handling and develop a plan to handle them in a healthier way upon discharge.    Participation Level:  Active  Participation Quality:  Appropriate  Affect:  Appropriate  Cognitive:  Appropriate  Insight: Appropriate  Engagement in Group:  Engaged  Modes of Intervention:  Activity, Socialization, and Support  Additional Comments:   Pt actively participated in the Emotional Wellness group. Pt contributed to the discussion on managing emotions and assisted with comprising a play list to utilize as a healthy and effective coping strategy for symptom management and emotional regulation.   Ronald [name] 05/26/2023, 11:32 AM

## 2023-05-25 NOTE — Progress Notes (Signed)
   05/24/23 2300  Psych Admission Type (Psych Patients Only)  Admission Status Involuntary  Psychosocial Assessment  Patient Complaints None  Eye Contact Fair  Facial Expression Animated  Affect Appropriate to circumstance  Speech Logical/coherent  Interaction Assertive  Motor Activity Slow  Appearance/Hygiene Unremarkable  Behavior Characteristics Cooperative;Appropriate to situation  Mood Pleasant  Thought Process  Coherency WDL  Content WDL  Delusions None reported or observed  Perception WDL  Hallucination None reported or observed  Judgment Poor  Confusion None  Danger to Self  Current suicidal ideation? Denies  Agreement Not to Harm Self Yes  Description of Agreement verbal  Danger to Others  Danger to Others None reported or observed

## 2023-05-25 NOTE — Group Note (Signed)
 Date:  05/25/2023 Time:  4:10 PM  Group Topic/Focus:  Goals Group:   The focus of this group is to help patients establish daily goals to achieve during treatment and discuss how the patient can incorporate goal setting into their daily lives to aide in recovery. Orientation:   The focus of this group is to educate the patient on the purpose and policies of crisis stabilization and provide a format to answer questions about their admission.  The group details unit policies and expectations of patients while admitted.    Participation Level:  Active  Participation Quality:  Appropriate  Affect:  Appropriate  Cognitive:  Appropriate  Insight: Appropriate  Engagement in Group:  Engaged  Modes of Intervention:  Activity, Orientation, and Rapport Building  Additional Comments:   Pt attended and participated in the Orientation and Goals group. Pt expressed frustration with the current mask mandate for the dayroom. Pt completed the goals activity. Pt personal goal for today is to work on medication regulation and to get along with everyone.   Ronald [name] 05/25/2023, 4:10 PM

## 2023-05-26 DIAGNOSIS — F250 Schizoaffective disorder, bipolar type: Secondary | ICD-10-CM | POA: Diagnosis not present

## 2023-05-26 NOTE — Group Note (Signed)
 Date:  05/26/2023 Time:  4:45 PM  Group Topic/Focus:  Healthy Communication:   The focus of this group is to discuss communication, barriers to communication, as well as healthy ways to communicate with others. Managing Feelings:   The focus of this group is to identify what feelings patients have difficulty handling and develop a plan to handle them in a healthier way upon discharge.    Participation Level:  Active  Participation Quality:  Appropriate  Affect:  Appropriate  Cognitive:  Appropriate  Insight: Appropriate  Engagement in Group:  Engaged  Modes of Intervention:  Discussion, Education, and Role-play  Additional Comments:   Pt actively engaged in the Emotional Wellness group. Pt assisted with identifying barriers to effective communication and emotional regulation. Pt participated in the development  and role play of alternative responses and effective communication techniques to foster healthy communication to improve expression of feelings, thoughts, ideas and self-advocacy.  Ronald [name] Ronald [name] 05/26/2023, 4:45 PM

## 2023-05-26 NOTE — Progress Notes (Addendum)
 Patient ID: HARUO HOCKENSMITH, male   DOB: [birth year], 43 y.o.   MRN: [id number] [facility] MD Progress Note  05/26/2023 10:48 AM JAXIEL KOTALIK  MRN:  [id number]  Principal Problem: Schizoaffective disorder, bipolar type (HCC) Diagnosis: Principal Problem:   Schizoaffective disorder, bipolar type (HCC) Active Problems:   GAD (generalized anxiety disorder)   Cannabis use disorder  HPI: Patient is a 43 year old Caucasian male with prior mental health diagnoses of anxiety, cannabis use disorder, PTSD & Benzodiazepine abuse, who presented to the the [facility]) on 2/3 via law enforcement after his mother called for assistance for worsening aggressive behaviors & paranoia at home as per information obtained from his mother by provider at [facility]. As per documentation from the [facility]:   Yesterday the psychiatry team made the following recommendations: -Discontinued Zyprexa  5 mg in the mornings for psychosis -Discontinued Zyprexa  10 mg nightly for psychosis and mood stabilization -Continue nicotine  patch 14 mg daily for nicotine  dependence -Continue melatonin 10 mg nightly for sleep -Continue Ensure twice daily in between meals for nutritional supplementation -Continue multivitamins daily for nutritional supplementation.  -Continue Depakote  ER 500 mg po bid for mood stabilization.  -Depakote  level to be obtained 05-27-23.  Today's assessment notes:  On assessment today, the pt reports that his mood is euthymic, improved since admission, and stable. Denies feeling down, depressed, or sad. Chart reviewed and findings discussed with the treatment team and consult with attending psychiatrist. He presents alert, oriented & aware of situation. His affect is good. He states "I am taking my medication, and my mood is better since the Zyprexa  was discontinued." Reports mood being stable with Depakote .  Reports depression 0/10. Reports anxiety 0/10.  Sleep is stable. RN noted 8  hours sleep.  Appetite is stable.  Concentration is without complaint.  Energy level is adequate. Denies having any suicidal thoughts. Denies having any suicidal intent and plan.  Denies having any HI.  Denies having psychotic symptoms, visual/auditory hallucinations.  Denies having side effects to current psychiatric medications.   Discussed discharge planning. Goal is to obtain Depakote  levels tomorrow morning. If in therapeutic range and patient continues to improve, plan to possibly discharge tomorrow 05/27/23 home.     Total Time spent with patient:  35 minutes  Past Psychiatric History: See H & P  Past Medical History:  Past Medical History:  Diagnosis Date   Anxiety    Arthritis    Disorder of pineal gland    Post traumatic stress disorder     Past Surgical History:  Procedure Laterality Date   ANKLE SURGERY     Family History: History reviewed. No pertinent family history.  Family Psychiatric  History: See H & P  Social History:  Social History   Substance and Sexual Activity  Alcohol Use No     Social History   Substance and Sexual Activity  Drug Use Yes   Types: Marijuana   Comment: one year ago    Social History   Socioeconomic History   Marital status: Single    Spouse name: Not on file   Number of children: Not on file   Years of education: Not on file   Highest education level: Not on file  Occupational History   Not on file  Tobacco Use   Smoking status: Never   Smokeless tobacco: Current    Types: Chew  Vaping Use   Vaping status: Never Used  Substance and Sexual Activity   Alcohol use: No  Drug use: Yes    Types: Marijuana    Comment: one year ago   Sexual activity: Not Currently  Other Topics Concern   Not on file  Social History Narrative   Not on file   Social Drivers of Health   Financial Resource Strain: Not on file  Food Insecurity: No Food Insecurity (05/20/2023)   Hunger Vital Sign    Worried About Running Out of Food  in the Last Year: Never true    Ran Out of Food in the Last Year: Never true  Transportation Needs: No Transportation Needs (05/20/2023)   PRAPARE - Administrator, Civil Service (Medical): No    Lack of Transportation (Non-Medical): No  Physical Activity: Not on file  Stress: Not on file  Social Connections: Not on file   Sleep: Good  Appetite:  Fair  Current Medications: Current Facility-Administered Medications  Medication Dose Route Frequency Provider Last Rate Last Admin   acetaminophen  (TYLENOL ) tablet 650 mg  650 mg Oral Q6H PRN Coleman, Carolyn H, NP   650 mg at 05/25/23 2031   alum & mag hydroxide-simeth (MAALOX/MYLANTA) 200-200-20 MG/5ML suspension 30 mL  30 mL Oral Q4H PRN Coleman, Carolyn H, NP   30 mL at 05/20/23 2346   divalproex  (DEPAKOTE  ER) 24 hr tablet 500 mg  500 mg Oral BID Asuncion Layer I, NP   500 mg at 05/26/23 0855   feeding supplement (ENSURE ENLIVE / ENSURE PLUS) liquid 237 mL  237 mL Oral BID BM Massengill, Elana Grayer, MD   237 mL at 05/26/23 1009   gabapentin  (NEURONTIN ) capsule 200 mg  200 mg Oral TID Asuncion Layer I, NP   200 mg at 05/26/23 0854   magnesium  hydroxide (MILK OF MAGNESIA) suspension 30 mL  30 mL Oral Daily PRN Costella Dirks, NP   30 mL at 05/21/23 1701   melatonin tablet 10 mg  10 mg Oral QHS [name], [name], NP   10 mg at 05/25/23 2032   multivitamin with minerals tablet 1 tablet  1 tablet Oral Q breakfast [name], [name], NP   1 tablet at 05/26/23 0854   nicotine  (NICODERM CQ  - dosed in mg/24 hours) patch 14 mg  14 mg Transdermal Daily Coleman, Carolyn H, NP   14 mg at 05/26/23 1308   nicotine  polacrilex (NICORETTE ) gum 2 mg  2 mg Oral Q4H PRN [name], [name], NP   2 mg at 05/26/23 0855   OLANZapine  (ZYPREXA ) injection 10 mg  10 mg Intramuscular TID PRN Costella Dirks, NP       OLANZapine  (ZYPREXA ) injection 5 mg  5 mg Intramuscular TID PRN Coleman, Carolyn H, NP       OLANZapine  zydis (ZYPREXA ) disintegrating tablet 5 mg  5  mg Oral TID PRN Coleman, Carolyn H, NP   5 mg at 05/23/23 0321   traZODone  (DESYREL ) tablet 50 mg  50 mg Oral QHS PRN Coleman, Carolyn H, NP   50 mg at 05/25/23 2117    Lab Results:  No results found for this or any previous visit (from the past 48 hours).  Blood Alcohol level:  Lab Results  Component Value Date   [facility] <10 05/17/2023   ETH <10 05/15/2023   Metabolic Disorder Labs: Lab Results  Component Value Date   HGBA1C 4.9 05/17/2023   MPG 93.93 05/17/2023   MPG 93.93 09/29/2021   No results found for: "PROLACTIN" Lab Results  Component Value Date   CHOL 152 05/17/2023  TRIG 70 05/17/2023   HDL 78 05/17/2023   CHOLHDL 1.9 05/17/2023   VLDL 14 05/17/2023   LDLCALC 60 05/17/2023   LDLCALC 55 02/19/2020   Physical Findings: AIMS:0  CIWA: n/a COWS:  n/a  Musculoskeletal: Strength & Muscle Tone: within normal limits Gait & Station: normal Patient leans: N/A  Psychiatric Specialty Exam:  Presentation  General Appearance:  Appropriate for Environment; Casual; Fairly Groomed  Eye Contact: Good  Speech: Clear and Coherent; Normal Rate  Speech Volume: Normal  Handedness: Right  Mood and Affect  Mood: -- (Denies anxiety or depression today)  Affect: Appropriate; Congruent  Thought Process  Thought Processes: Coherent; Goal Directed  Descriptions of Associations:Intact  Orientation:Full (Time, Place and Person)  Thought Content:Logical  History of Schizophrenia/Schizoaffective disorder:Yes  Duration of Psychotic Symptoms:Greater than six months  Hallucinations:Hallucinations: None Description of Auditory Hallucinations: NA  Ideas of Reference:None  Suicidal Thoughts:Suicidal Thoughts: No  Homicidal Thoughts:Homicidal Thoughts: No  Sensorium  Memory: Immediate Good; Recent Good; Remote Good  Judgment: Good  Insight: Present; Good  Executive Functions  Concentration: Good  Attention Span: Good  Recall: Good  Fund of  Knowledge: Good  Language: Good  Psychomotor Activity  Psychomotor Activity: Psychomotor Activity: Normal  Assets  Assets: Communication Skills; Desire for Improvement; Financial Resources/Insurance; Housing; Physical Health; Vocational/Educational  Sleep  Sleep: Sleep: Good Number of Hours of Sleep: 8  Physical Exam: Physical Exam Vitals and nursing note reviewed.  Constitutional:      General: He is not in acute distress.    Appearance: Normal appearance. He is not ill-appearing, toxic-appearing or diaphoretic.  HENT:     Head: Atraumatic.     Right Ear: External ear normal.     Left Ear: External ear normal.     Nose: Nose normal.     Mouth/Throat:     Mouth: Mucous membranes are moist.  Eyes:     General:        Right eye: No discharge.        Left eye: No discharge.  Cardiovascular:     Rate and Rhythm: Normal rate.     Pulses: Normal pulses.  Pulmonary:     Effort: Pulmonary effort is normal. No respiratory distress.  Abdominal:     Comments: Deferred   Genitourinary:    Comments: Deferred Musculoskeletal:        General: No deformity. Normal range of motion.     Cervical back: Normal range of motion.  Skin:    General: Skin is warm.     Coloration: Skin is not jaundiced or pale.  Neurological:     General: No focal deficit present.     Mental Status: He is alert and oriented to person, place, and time.  Psychiatric:        Mood and Affect: Mood normal.        Behavior: Behavior normal.        Thought Content: Thought content normal.        Judgment: Judgment normal.    Review of Systems  Constitutional:  Negative for chills, diaphoresis and fever.  HENT:  Negative for congestion, ear discharge and sore throat.   Eyes:  Negative for discharge and redness.  Respiratory:  Negative for cough, shortness of breath and wheezing.   Cardiovascular:  Negative for chest pain and palpitations.  Gastrointestinal:  Negative for abdominal pain, constipation,  diarrhea, heartburn, nausea and vomiting.  Genitourinary:  Negative for dysuria, frequency and urgency.  Musculoskeletal:  Negative for  joint pain and myalgias.  Skin:  Negative for itching.  Neurological:  Negative for dizziness, tingling, tremors, sensory change, speech change, focal weakness, seizures, loss of consciousness, weakness and headaches.  Endo/Heme/Allergies:        Allergies: Alupent, Benadryl, Klonopin .  Psychiatric/Behavioral:  Negative for depression, hallucinations and suicidal ideas. The patient is not nervous/anxious and does not have insomnia.   All other systems reviewed and are negative.  Blood pressure (!) 137/93, pulse 75, temperature 97.7 F (36.5 C), temperature source Oral, resp. rate 16, height 5\' 11"  (1.803 m), weight 69.3 kg, SpO2 99%. Body mass index is 21.31 kg/m.  Treatment Plan Summary: Daily contact with patient to assess and evaluate symptoms and progress in treatment and Medication management   Safety and Monitoring: Voluntary admission to inpatient psychiatric unit for safety, stabilization and treatment Daily contact with patient to assess and evaluate symptoms and progress in treatment Patient's case to be discussed in multi-disciplinary team meeting Observation Level : q15 minute checks Vital signs: q12 hours Precautions: Safety    Diagnoses Principal Problem:   Schizoaffective disorder, bipolar type (HCC) Active Problems:   GAD (generalized anxiety disorder)   Cannabis use disorder   Medications -Discontinued Zyprexa  5 mg in the mornings for psychosis -Discontinued Zyprexa  10 mg nightly for psychosis and mood stabilization -Continue nicotine  patch 14 mg daily for nicotine  dependence -Continue melatonin 10 mg nightly for sleep -Continue Ensure twice daily in between meals for nutritional supplementation -Continue multivitamins daily for nutritional supplementation.  -Continue Depakote  ER 500 mg po bid for mood stabilization.   -Depakote  level to be obtained 05-27-23.   PRNS -Continue agitation protocol medications as per the [facility] -Continue Nicorette  gum as needed every 4 hours for nicotine  dependence -Continue trazodone  50 mg nightly as needed for sleep -Continue Tylenol  650 mg every 6 hours PRN for mild pain -Continue Maalox 30 mg every 4 hrs PRN for indigestion -Continue Milk of Magnesia as needed every 6 hrs for constipation    Discharge Planning: Social work and case management to assist with discharge planning and identification of hospital follow-up needs prior to discharge Estimated LOS: 5-7 days Discharge Concerns: Need to establish a safety plan; Medication compliance and effectiveness Discharge Goals: Return home with outpatient referrals for mental health follow-up including medication management/psychotherapy   I certify that inpatient services furnished can reasonably be expected to improve the patient's condition.    Laurence Pons, FNP, Tolbert Fothergill NP-Student  05/26/2023, 10:48 AM Patient ID: [name], male   DOB: [birth year], 43 y.o.   MRN: [id number] Patient ID: JESSUP ARCILA, male   DOB: 1980/07/10, 43 y.o.   MRN: [id number] Patient ID: TAJAI LENNARTZ, male   DOB: 1980-07-26, 43 y.o.   MRN: [id number] Patient ID: THOMASJAMES PETRELLA, male   DOB: [birth year], 43 y.o.   MRN: [id number] Patient ID: RAMADAN CHAPMON, male   DOB: 04/08/1981, 43 y.o.   MRN: [id number]

## 2023-05-26 NOTE — [facility] Group Notes (Signed)
 Spiritual care group on grief and loss facilitated by Chaplain Nick Barman, B[facility]  Group Goal: Support / Education around grief and loss  Members engage in facilitated group support and psycho-social education.  Group Description:  Following introductions and group rules, group members engaged in facilitated group dialogue and support around topic of loss, with particular support around experiences of loss in their lives. Group Identified types of loss (relationships / self / things) and identified patterns, circumstances, and changes that precipitate losses. Reflected on thoughts / feelings around loss, normalized grief responses, and recognized variety in grief experience. Group encouraged individual reflection on safe space and on the coping skills that they are already utilizing.  Group drew on Adlerian / Rogerian and narrative framework  Patient Progress: Ronald [name] attended group and actively engaged and participated in group conversation and activities.  His comments demonstrated good insight and he was supportive of peers.

## 2023-05-26 NOTE — Group Note (Signed)
 Recreation Therapy Group Note   Group Topic:Stress Management  Group Date: 05/26/2023 Start Time: 0935 End Time: 0954 Facilitators: Kevina Piloto-McCall, LRT,CTRS Location: [address] Dayroom   Group Topic: Stress Management   Goal Area(s) Addresses:  Patient will identify positive stress management techniques. Patient will identify benefits of using stress management post d/c.   Intervention: Insight Timer App   Activity: Meditation. LRT played a meditation that focused on reducing stress, anxiety and worry. Patients were to focus on their breathing and allow the meditation to relax and calm them in order to get the full affect of the meditation.     Education:  Stress Management, Discharge Planning.    Education Outcome: Acknowledges Education   Affect/Mood: Appropriate   Participation Level: Engaged   Participation Quality: Independent   Behavior: Appropriate   Speech/Thought Process: Focused   Insight: Good   Judgement: Good   Modes of Intervention: Insight Timer App   Patient Response to Interventions:  Engaged   Education Outcome:  In group clarification offered    Clinical Observations/Individualized Feedback: Pt attended and participated in group session. Pt was engaged and appropriate throughout group session.      Plan: Continue to engage patient in RT group sessions 2-3x/week.   Jolin Benavides-McCall, LRT,CTRS 05/26/2023 12:28 PM

## 2023-05-26 NOTE — Progress Notes (Signed)
   05/25/23 2035  Psych Admission Type (Psych Patients Only)  Admission Status Involuntary  Psychosocial Assessment  Patient Complaints None  Eye Contact Brief  Facial Expression Animated  Affect Appropriate to circumstance  Speech Logical/coherent  Interaction Assertive  Motor Activity Other (Comment) (wnl)  Appearance/Hygiene Unremarkable  Behavior Characteristics Cooperative;Appropriate to situation;Calm  Mood Pleasant  Thought Process  Coherency WDL  Content WDL  Delusions None reported or observed  Perception WDL  Hallucination None reported or observed  Judgment Poor  Confusion None  Danger to Self  Current suicidal ideation? Denies

## 2023-05-26 NOTE — Group Note (Signed)
 Date:  05/26/2023 Time:  2:06 PM  Group Topic/Focus:  Goals Group:   The focus of this group is to help patients establish daily goals to achieve during treatment and discuss how the patient can incorporate goal setting into their daily lives to aide in recovery. Orientation:   The focus of this group is to educate the patient on the purpose and policies of crisis stabilization and provide a format to answer questions about their admission.  The group details unit policies and expectations of patients while admitted.    Participation Level:  Active  Participation Quality:  Appropriate  Affect:  Appropriate  Cognitive:  Appropriate  Insight: Appropriate  Engagement in Group:  Engaged  Modes of Intervention:  Orientation, Rapport Building, and Support  Additional Comments:   Pt attended and participated in the Orientation and Goals group. Pt contributed to discussion and was supportive of peers as they shared feedback and expressed personal concerns. Pt personal goal is to work on discharge planning and medication regulation.  Ronald [name] 05/26/2023, 2:06 PM

## 2023-05-26 NOTE — Progress Notes (Signed)
   05/26/23 1127  Psych Admission Type (Psych Patients Only)  Admission Status Involuntary  Psychosocial Assessment  Patient Complaints None  Eye Contact Brief  Facial Expression Animated  Affect Appropriate to circumstance  Speech Logical/coherent  Interaction Assertive  Motor Activity Other (Comment) (WDL)  Appearance/Hygiene Unremarkable  Behavior Characteristics Cooperative  Mood Pleasant  Thought Process  Coherency WDL  Content WDL  Delusions None reported or observed  Perception WDL  Hallucination None reported or observed  Judgment Poor  Confusion None  Danger to Self  Current suicidal ideation? Denies  Agreement Not to Harm Self Yes  Description of Agreement verbal  Danger to Others  Danger to Others None reported or observed

## 2023-05-26 NOTE — Plan of Care (Signed)
  Problem: Health Behavior/Discharge Planning: Goal: Compliance with prescribed medication regimen will improve Outcome: Progressing   Problem: Role Relationship: Goal: Ability to communicate needs accurately will improve Outcome: Progressing   Problem: Safety: Goal: Ability to remain free from injury will improve Outcome: Progressing   Problem: Self-Concept: Goal: Will verbalize positive feelings about self Outcome: Progressing

## 2023-05-26 NOTE — Group Note (Signed)
 Occupational Therapy Group Note  Group Topic: Sleep Hygiene  Group Date: 05/26/2023 Start Time: 1427 End Time: 1455 Facilitators: Lynnda Sas, OT   Group Description: Group encouraged increased participation and engagement through topic focused on sleep hygiene. Patients reflected on the quality of sleep they typically receive and identified areas that need improvement. Group was given background information on sleep and sleep hygiene, including common sleep disorders. Group members also received information on how to improve one's sleep and introduced a sleep diary as a tool that can be utilized to track sleep quality over a length of time. Group session ended with patients identifying one or more strategies they could utilize or implement into their sleep routine in order to improve overall sleep quality.        Therapeutic Goal(s):  Identify one or more strategies to improve overall sleep hygiene  Identify one or more areas of sleep that are negatively impacted (sleep too much, too little, etc)     Participation Level: Active and Engaged   Participation Quality: Independent   Behavior: Appropriate   Speech/Thought Process: Relevant   Affect/Mood: Appropriate   Insight: Fair   Judgement: Fair      Modes of Intervention: Education  Patient Response to Interventions:  Attentive   Plan: Continue to engage patient in OT groups 2 - 3x/week.  05/26/2023  Lynnda Sas, OT   Deveon Kisiel, OT

## 2023-05-27 DIAGNOSIS — F250 Schizoaffective disorder, bipolar type: Secondary | ICD-10-CM | POA: Diagnosis not present

## 2023-05-27 LAB — VALPROIC ACID LEVEL: Valproic Acid Lvl: 46 ug/mL — ABNORMAL LOW (ref 50.0–100.0)

## 2023-05-27 MED ORDER — NICOTINE POLACRILEX 2 MG MT GUM: 2.0000 mg | CHEWING_GUM | OROMUCOSAL | 0 refills | Status: DC | PRN

## 2023-05-27 MED ORDER — MELATONIN 10 MG PO TABS: 10.0000 mg | ORAL_TABLET | Freq: Every day | ORAL | Status: DC

## 2023-05-27 MED ORDER — DIVALPROEX SODIUM ER 500 MG PO TB24
500.0000 mg | ORAL_TABLET | Freq: Two times a day (BID) | ORAL | 0 refills | Status: DC
Start: 2023-05-27 — End: 2023-06-13

## 2023-05-27 MED ORDER — GABAPENTIN 100 MG PO CAPS: 200.0000 mg | ORAL_CAPSULE | Freq: Three times a day (TID) | ORAL | 0 refills | Status: DC

## 2023-05-27 MED ORDER — TRAZODONE HCL 50 MG PO TABS
50.0000 mg | ORAL_TABLET | Freq: Every day | ORAL | 0 refills | Status: DC
Start: 2023-05-27 — End: 2023-06-13

## 2023-05-27 MED ORDER — ADULT MULTIVITAMIN W/MINERALS CH: 1.0000 | ORAL_TABLET | Freq: Every day | ORAL | Status: DC

## 2023-05-27 MED ORDER — NICOTINE 14 MG/24HR TD PT24
14.0000 mg | MEDICATED_PATCH | Freq: Every day | TRANSDERMAL | 0 refills | Status: DC
Start: 2023-05-28 — End: 2023-09-02

## 2023-05-27 NOTE — Progress Notes (Signed)
I assumed care for Mr Ronald [name] at about 08:00. He was resting in his room, later seen during am med pass. Veteran denied any new pain, denied any avh/hi/si at the time of my assessment, he took all his scheduled meds, received prn Nicotene lozenge. Vital signs WNL.  He was discharged to the care of his mother, he left the facility at about 11 am, fully ambulatory, personal belongings handed over to patient, medication handed over to patient, he verbalized understanding of all discharge instructions.

## 2023-05-27 NOTE — Plan of Care (Signed)
Problem: Education: Goal: Knowledge of [facility] Education information/materials will improve Outcome: Adequate for Discharge Goal: Emotional status will improve Outcome: Adequate for Discharge Goal: Mental status will improve Outcome: Adequate for Discharge Goal: Verbalization of understanding the information provided will improve Outcome: Adequate for Discharge   Problem: Activity: Goal: Interest or engagement in activities will improve Outcome: Adequate for Discharge Goal: Sleeping patterns will improve Outcome: Adequate for Discharge   Problem: Coping: Goal: Ability to verbalize frustrations and anger appropriately will improve Outcome: Adequate for Discharge Goal: Ability to demonstrate self-control will improve Outcome: Adequate for Discharge   Problem: Health Behavior/Discharge Planning: Goal: Identification of resources available to assist in meeting health care needs will improve Outcome: Adequate for Discharge Goal: Compliance with treatment plan for underlying cause of condition will improve Outcome: Adequate for Discharge   Problem: Physical Regulation: Goal: Ability to maintain clinical measurements within normal limits will improve Outcome: Adequate for Discharge   Problem: Safety: Goal: Periods of time without injury will increase Outcome: Adequate for Discharge   Problem: Education: Goal: Knowledge of [facility] Education information/materials will improve Outcome: Adequate for Discharge Goal: Emotional status will improve Outcome: Adequate for Discharge Goal: Mental status will improve Outcome: Adequate for Discharge Goal: Verbalization of understanding the information provided will improve Outcome: Adequate for Discharge   Problem: Activity: Goal: Interest or engagement in activities will improve Outcome: Adequate for Discharge Goal: Sleeping patterns will improve Outcome: Adequate for Discharge   Problem: Coping: Goal:  Ability to verbalize frustrations and anger appropriately will improve Outcome: Adequate for Discharge Goal: Ability to demonstrate self-control will improve Outcome: Adequate for Discharge   Problem: Health Behavior/Discharge Planning: Goal: Identification of resources available to assist in meeting health care needs will improve Outcome: Adequate for Discharge Goal: Compliance with treatment plan for underlying cause of condition will improve Outcome: Adequate for Discharge   Problem: Physical Regulation: Goal: Ability to maintain clinical measurements within normal limits will improve Outcome: Adequate for Discharge   Problem: Safety: Goal: Periods of time without injury will increase Outcome: Adequate for Discharge   Problem: Education: Goal: Utilization of techniques to improve thought processes will improve Outcome: Adequate for Discharge Goal: Knowledge of the prescribed therapeutic regimen will improve Outcome: Adequate for Discharge   Problem: Activity: Goal: Interest or engagement in leisure activities will improve Outcome: Adequate for Discharge Goal: Imbalance in normal sleep/wake cycle will improve Outcome: Adequate for Discharge   Problem: Coping: Goal: Coping ability will improve Outcome: Adequate for Discharge Goal: Will verbalize feelings Outcome: Adequate for Discharge   Problem: Health Behavior/Discharge Planning: Goal: Ability to make decisions will improve Outcome: Adequate for Discharge Goal: Compliance with therapeutic regimen will improve Outcome: Adequate for Discharge   Problem: Role Relationship: Goal: Will demonstrate positive changes in social behaviors and relationships Outcome: Adequate for Discharge   Problem: Safety: Goal: Ability to disclose and discuss suicidal ideas will improve Outcome: Adequate for Discharge Goal: Ability to identify and utilize support systems that promote safety will improve Outcome: Adequate for Discharge    Problem: Self-Concept: Goal: Will verbalize positive feelings about self Outcome: Adequate for Discharge Goal: Level of anxiety will decrease Outcome: Adequate for Discharge   Problem: Activity: Goal: Will identify at least one activity in which they can participate Outcome: Adequate for Discharge   Problem: Coping: Goal: Ability to identify and develop effective coping behavior will improve Outcome: Adequate for Discharge Goal: Ability to interact with others will improve Outcome: Adequate for Discharge Goal: Demonstration of  participation in decision-making regarding own care will improve Outcome: Adequate for Discharge Goal: Ability to use eye contact when communicating with others will improve Outcome: Adequate for Discharge   Problem: Health Behavior/Discharge Planning: Goal: Identification of resources available to assist in meeting health care needs will improve Outcome: Adequate for Discharge   Problem: Self-Concept: Goal: Will verbalize positive feelings about self Outcome: Adequate for Discharge   Problem: Activity: Goal: Will verbalize the importance of balancing activity with adequate rest periods Outcome: Adequate for Discharge   Problem: Education: Goal: Will be free of psychotic symptoms Outcome: Adequate for Discharge Goal: Knowledge of the prescribed therapeutic regimen will improve Outcome: Adequate for Discharge   Problem: Coping: Goal: Coping ability will improve Outcome: Adequate for Discharge Goal: Will verbalize feelings Outcome: Adequate for Discharge

## 2023-05-27 NOTE — Progress Notes (Signed)
  [facility] Adult Case Management Discharge Plan :  Will you be returning to the same living situation after discharge:  Yes,  pt will be returning home with mother  At discharge, do you have transportation home?: Yes,  pt will be picked up at 11:00AM Do you have the ability to pay for your medications: Yes,  pt will also be getting samples at discharge  Release of information consent forms completed and in the chart;  Patient's signature needed at discharge.  Patient to Follow up at:  Follow-up Information     [facility]. Go on 06/03/2023.   Specialty: Behavioral Health Why: Please go to this provider on 06/03/23 at 7:00 am for medication management services. You may also go on Monday through Friday, arrive by 7:00 am for same day service. Contact information: [address] [place] [address] [phone]        [facility]. Go on 06/04/2023.   Specialty: Professional Counselor Why: Please go to this provider on 06/04/23 at 9:00 am for therapy services. You may also go on Monday through Friday, from 9 am to 1 pm. Contact information: [address] [name] [address] [phone]                 Next level of care provider has access to [facility] Link:no  Safety Planning and Suicide Prevention discussed: Yes,  [name],  (mother [phone])    Has patient been referred to the Quitline?: Yes, faxed/e-referral on 05/27/23  Patient has been referred for addiction treatment: No known substance use disorder. Pt not currently using substances.  Kathi Der, LCSWA 05/27/2023, 9:31 AM

## 2023-05-27 NOTE — Progress Notes (Signed)
   05/26/23 2015  Psych Admission Type (Psych Patients Only)  Admission Status Involuntary  Psychosocial Assessment  Patient Complaints Other (Comment);None (Pt appeared polite and cooperative, engaging readily in discussion. He stated, "I'm ready to discharge so I can resume my life. My mom has cancer and I have to pay bills." Pt redirected self when frustrated and switched it to an empowered mindset.)  Eye Contact Fair  Facial Expression Pensive;Other (Comment) (Intense)  Affect Appropriate to circumstance  Speech Logical/coherent  Interaction Assertive  Appearance/Hygiene In scrubs;Unremarkable  Behavior Characteristics Cooperative;Appropriate to situation;Other (Comment) (Social and interactive with peers in milieu)  Mood Pleasant (Discussed pending discharge. Pt agreed to blood draw in am for VPA level. Safety plan completed.)  Thought Process  Coherency WDL  Content WDL  Delusions None reported or observed  Perception WDL  Hallucination None reported or observed  Judgment Limited  Confusion None  Danger to Self  Current suicidal ideation? Denies  Agreement Not to Harm Self Yes  Description of Agreement Verbal  Danger to Others  Danger to Others None reported or observed

## 2023-05-27 NOTE — Plan of Care (Signed)
  Problem: Health Behavior/Discharge Planning: Goal: Compliance with prescribed medication regimen will improve Outcome: Adequate for Discharge   Problem: Nutritional: Goal: Ability to achieve adequate nutritional intake will improve Outcome: Adequate for Discharge   Problem: Role Relationship: Goal: Ability to communicate needs accurately will improve Outcome: Adequate for Discharge Goal: Ability to interact with others will improve Outcome: Adequate for Discharge   Problem: Safety: Goal: Ability to redirect hostility and anger into socially appropriate behaviors will improve Outcome: Adequate for Discharge Goal: Ability to remain free from injury will improve Outcome: Adequate for Discharge   Problem: Self-Care: Goal: Ability to participate in self-care as condition permits will improve Outcome: Adequate for Discharge   Problem: Self-Concept: Goal: Will verbalize positive feelings about self Outcome: Adequate for Discharge

## 2023-05-27 NOTE — [facility] Suicide Risk Assessment (Signed)
Suicide Risk Assessment  Discharge Assessment    [facility] Discharge Suicide Risk Assessment   Principal Problem: Schizoaffective disorder, bipolar type [facility]) Discharge Diagnoses: Principal Problem:   Schizoaffective disorder, bipolar type (HCC) Active Problems:   GAD (generalized anxiety disorder)   Cannabis use disorder  Reason for admission:  Patient is a 43 year old Caucasian male with prior mental health diagnoses of anxiety, cannabis use disorder, PTSD & Benzodiazepine abuse, who [facility] to the the [employer] [facility] on 2/3 via Patent examiner after his mother called for assistance for worsening aggressive behaviors & paranoia at home as per information obtained from his mother by provider at [facility].   Total Time spent with patient: 30 minutes  Musculoskeletal: Strength & Muscle Tone: within normal limits Gait & Station: normal Patient leans: N/A  Psychiatric Specialty Exam  Presentation  General Appearance:  Appropriate for Environment; Casual; Fairly Groomed  Eye Contact: Good  Speech: Clear and Coherent  Speech Volume: Normal  Handedness: Right  Mood and Affect  Mood: Euthymic  Duration of Depression Symptoms: Less than two weeks  Affect: Appropriate; Congruent  Thought Process  Thought Processes: Coherent  Descriptions of Associations:Intact  Orientation:Full (Time, Place and Person)  Thought Content:Logical  History of Schizophrenia/Schizoaffective disorder:Yes  Duration of Psychotic Symptoms:Less than six months  Hallucinations:Hallucinations: None Description of Auditory Hallucinations: Denies  Ideas of Reference:None  Suicidal Thoughts:Suicidal Thoughts: No  Homicidal Thoughts:Homicidal Thoughts: No  Sensorium  Memory: Immediate Good; Recent Good  Judgment: Good  Insight: Good  Executive Functions  Concentration: Good  Attention Span: Good  Recall: Good  Fund of  Knowledge: Good  Language: Good  Psychomotor Activity  Psychomotor Activity: Psychomotor Activity: Normal  Assets  Assets: Communication Skills; Physical Health; Resilience  Sleep  Sleep: Sleep: Good Number of Hours of Sleep: 8  Physical Exam: Physical Exam Vitals and nursing note reviewed.  Constitutional:      Appearance: He is normal weight.  HENT:     Head: Normocephalic.     Nose: Nose normal.     Mouth/Throat:     Mouth: Mucous membranes are moist.     Pharynx: Oropharynx is clear.  Eyes:     Extraocular Movements: Extraocular movements intact.  Cardiovascular:     Rate and Rhythm: Normal rate.     Pulses: Normal pulses.  Pulmonary:     Effort: Pulmonary effort is normal.  Abdominal:     Comments: Deferred  Genitourinary:    Comments: Deferred Musculoskeletal:        General: Normal range of motion.     Cervical back: Normal range of motion.  Skin:    General: Skin is warm.  Neurological:     General: No focal deficit present.     Mental Status: He is alert and oriented to person, place, and time.  Psychiatric:        Mood and Affect: Mood normal.        Behavior: Behavior normal.        Thought Content: Thought content normal.    Review of Systems  Constitutional:  Negative for chills and fever.  HENT:  Negative for hearing loss and sore throat.   Eyes:  Negative for blurred vision.  Respiratory:  Negative for cough, sputum production, shortness of breath and wheezing.   Cardiovascular:  Negative for chest pain and palpitations.  Gastrointestinal:  Negative for abdominal pain, constipation, diarrhea, heartburn, nausea and vomiting.  Genitourinary:  Negative for dysuria, frequency and urgency.  Musculoskeletal: Negative.  Skin:  Negative for itching and rash.  Neurological:  Negative for dizziness, tingling and headaches.  Endo/Heme/Allergies:        See allergy listing  Psychiatric/Behavioral:  Positive for depression. The patient is  nervous/anxious.    Blood pressure 129/88, pulse 74, temperature 98.6 F (37 C), temperature source Oral, resp. rate 18, height 5\' 11"  (1.803 m), weight 69.3 kg, SpO2 99%. Body mass index is 21.31 kg/m.  Mental Status Per Nursing Assessment::   On Admission:  NA  Demographic Factors:  Male, Caucasian, and Low socioeconomic status  Loss Factors: NA  Historical Factors: NA  Risk Reduction Factors:   Employed, Living with another person, especially a relative, Positive social support, Positive therapeutic relationship, and Positive coping skills or problem solving skills  Continued Clinical Symptoms:  Bipolar Disorder:   Depressive phase Depression:   Recent sense of peace/wellbeing Alcohol/Substance Abuse/Dependencies Schizophrenia:   Depressive state More than one psychiatric diagnosis  Cognitive Features That Contribute To Risk:  Polarized thinking    Suicide Risk:  Mild: There are no identifiable plans, no associated intent, mild dysphoria and related symptoms, good self-control (both objective and subjective assessment), few other risk factors, and identifiable protective factors, including available and accessible social support.   Follow-up Information     [facility]. Go on 06/03/2023.   Specialty: Behavioral Health Why: Please go to this provider on 06/03/23 at 7:00 am for medication management services. You may also go on Monday through Friday, arrive by 7:00 am for same day service. Contact information: [address] [place] [address] [phone]        [place] Go on 06/04/2023.   Specialty: Professional Counselor Why: Please go to this provider on 06/04/23 at 9:00 am for therapy services. You may also go on Monday through Friday, from 9 am to 1 pm. Contact information: [address] [name] [address] [phone]                Plan Of Care/Follow-up recommendations:   Discharge Recommendations:  The patient is being discharged to home. Patient is to take his discharge medications as ordered.  See follow up above. We recommend that he participates in individual therapy to target uncontrollable agitation and substance abuse.  We recommend that he participates in therapy to target personal conflict, to improve communication skills and conflict resolution skills. Patient is to initiate/implement a contingency based behavioral model to address his behavior. We recommend that he gets AIMS scale, height, weight, blood pressure, fasting lipid panel, fasting blood sugar in three months from discharge if he's on atypical antipsychotics.  Patient will benefit from monitoring of recurrent suicidal ideation since patient is on antidepressant medication. The patient should abstain from all illicit substances and alcohol. If the patient's symptoms worsen or do not continue to improve or if the patient becomes actively suicidal or homicidal then it is recommended that the patient return to the closest hospital emergency room or call 911 for further evaluation and treatment. National Suicide Prevention Lifeline 1800-SUICIDE or [phone]. Please follow up with your primary medical doctor for all other medical needs.  The patient has been educated on the possible side effects to medications and she/her guardian is to contact a medical professional and inform outpatient provider of any new side effects of medication. He is to take regular diet and activity as tolerated.  Will benefit from moderate daily exercise. Patient and Family was educated about removing/locking any firearms,  medications or dangerous products from the home.  Activity:  As tolerated Diet:  Regular Diet   Cecilie Lowers, FNP 05/27/2023, 11:08 AM

## 2023-05-27 NOTE — Discharge Summary (Signed)
Physician Discharge Summary Note  Patient:  Ronald [name] is an 43 y.o., male MRN:  [id number] DOB:  [birth year] Patient phone:  [phone] (home)  Patient address:   [phone] [name] [address],   Total Time spent with patient: 45 minutes  Date of Admission:  05/18/2023 Date of Discharge:  05/27/2023  Reason for Admission:  Patient is a 43 year old Caucasian male with prior mental health diagnoses of anxiety, cannabis use disorder, PTSD & Benzodiazepine abuse, who presented to the the [facility]) on 2/3 via law enforcement after his mother called for assistance for worsening aggressive behaviors & paranoia at home as per information obtained from his mother by provider at [facility].   Principal Problem: Schizoaffective disorder, bipolar type [facility]) Discharge Diagnoses: Principal Problem:   Schizoaffective disorder, bipolar type (HCC) Active Problems:   GAD (generalized anxiety disorder)   Cannabis use disorder  Past Psychiatric History: Previous Psych Diagnoses: ptsd, P.Schizophrenia Prior inpatient treatment: -[facility], 12/14/2016.  -02/24/2020 at this [facility] [address] -09/27/2021 at this [facility]  Past Medical History:  Past Medical History:  Diagnosis Date   Anxiety    Arthritis    Disorder of pineal gland    Post traumatic stress disorder     Past Surgical History:  Procedure Laterality Date   ANKLE SURGERY     Family History: History reviewed. No pertinent family history. Family Psychiatric  History: See H&P Social History:  Social History   Substance and Sexual Activity  Alcohol Use No     Social History   Substance and Sexual Activity  Drug Use Yes   Types: Marijuana   Comment: one year ago    Social History   Socioeconomic History   Marital status: Single    Spouse name: Not on file   Number of children: Not on file   Years of education: Not on file   Highest education level: Not on file  Occupational History    Not on file  Tobacco Use   Smoking status: Never   Smokeless tobacco: Current    Types: Chew  Vaping Use   Vaping status: Never Used  Substance and Sexual Activity   Alcohol use: No   Drug use: Yes    Types: Marijuana    Comment: one year ago   Sexual activity: Not Currently  Other Topics Concern   Not on file  Social History Narrative   Not on file   Social Drivers of Health   Financial Resource Strain: Not on file  Food Insecurity: No Food Insecurity (05/20/2023)   Hunger Vital Sign    Worried About Running Out of Food in the Last Year: Never true    Ran Out of Food in the Last Year: Never true  Transportation Needs: No Transportation Needs (05/20/2023)   PRAPARE - Administrator, Civil Service (Medical): No    Lack of Transportation (Non-Medical): No  Physical Activity: Not on file  Stress: Not on file  Social Connections: Not on file   Hospital Course:  During the patient's hospitalization, patient had extensive initial psychiatric evaluation, and follow-up psychiatric evaluations every day.  Psychiatric diagnoses provided upon initial assessment:   Schizoaffective disorder, bipolar type (HCC) Active Problems:   GAD (generalized anxiety disorder)   Cannabis use disorder  Patient's psychiatric medications were adjusted on admission:  -Start Zyprexa 5 mg in the mornings for psychosis -Continue Zyprexa 10 mg nightly for psychosis and mood stabilization -Start nicotine patch 14 mg  daily for nicotine dependence -Continue melatonin 10 mg nightly for sleep -Continue Ensure twice daily in between meals for nutritional supplementation -Continue multivitamins daily for nutritional supplementation   During the hospitalization, other adjustments were made to the patient's psychiatric medication regimen:  Depakote ER 500 mg p.o. twice daily was initiated for mood stabilization (VPA level 46 today.) Zyprexa was discontinued Patient's care was discussed during the  interdisciplinary team meeting every day during the hospitalization.  The patient denies having side effects to prescribed psychiatric medication.  Gradually, patient started adjusting to milieu. The patient was evaluated each day by a clinical provider to ascertain response to treatment. Improvement was noted by the patient's report of decreasing symptoms, improved sleep and appetite, affect, medication tolerance, behavior, and participation in unit programming.  Patient was asked each day to complete a self inventory noting mood, mental status, pain, new symptoms, anxiety and concerns.    Symptoms were reported as significantly decreased or resolved completely by discharge.   On day of discharge, the patient reports that their mood is stable. The patient denied having suicidal thoughts for more than 48 hours prior to discharge.  Patient denies having homicidal thoughts.  Patient denies having auditory hallucinations.  Patient denies any visual hallucinations or other symptoms of psychosis. The patient was motivated to continue taking medication with a goal of continued improvement in mental health.   The patient reports their target psychiatric symptoms of mood disorder responded well to the psychiatric medications, and the patient reports overall benefit other psychiatric hospitalization. Supportive psychotherapy was provided to the patient. The patient also participated in regular group therapy while hospitalized. Coping skills, problem solving as well as relaxation therapies were also part of the unit programming.  Labs were reviewed with the patient, and abnormal results were discussed with the patient.  The patient is able to verbalize their individual safety plan to this provider.  # It is recommended to the patient to continue psychiatric medications as prescribed, after discharge from the hospital.    # It is recommended to the patient to follow up with your outpatient psychiatric  provider and PCP.  # It was discussed with the patient, the impact of alcohol, drugs, tobacco have been there overall psychiatric and medical wellbeing, and total abstinence from substance use was recommended the patient.ed.  # Prescriptions provided or sent directly to preferred pharmacy at discharge. Patient agreeable to plan. Given opportunity to ask questions. Appears to feel comfortable with discharge.    # In the event of worsening symptoms, the patient is instructed to call the crisis hotline, 911 and or go to the nearest ED for appropriate evaluation and treatment of symptoms. To follow-up with primary care provider for other medical issues, concerns and or health care needs  # Patient was discharged to home with a plan to follow up as noted below.   Addendum: Valproic acid level on discharge was 46.  Patient to follow up with outpatient PCP for abnormal lab results.  Physical Findings: AIMS:  , ,  ,  ,    CIWA:    COWS:     Musculoskeletal: Strength & Muscle Tone: within normal limits Gait & Station: normal Patient leans: N/A  Psychiatric Specialty Exam:  Presentation  General Appearance:  Appropriate for Environment; Casual; Fairly Groomed  Eye Contact: Good  Speech: Clear and Coherent  Speech Volume: Normal  Handedness: Right  Mood and Affect  Mood: Euthymic  Affect: Appropriate; Congruent  Thought Process  Thought Processes: Coherent  Descriptions of Associations:Intact  Orientation:Full (Time, Place and Person)  Thought Content:Logical  History of Schizophrenia/Schizoaffective disorder:Yes  Duration of Psychotic Symptoms:Less than six months  Hallucinations:Hallucinations: None Description of Auditory Hallucinations: Denies  Ideas of Reference:None  Suicidal Thoughts:Suicidal Thoughts: No  Homicidal Thoughts:Homicidal Thoughts: No   Sensorium  Memory: Immediate Good; Recent Good  Judgment: Good  Insight: Good  Executive  Functions  Concentration: Good  Attention Span: Good  Recall: Good  Fund of Knowledge: Good  Language: Good  Psychomotor Activity  Psychomotor Activity: Psychomotor Activity: Normal  Assets  Assets: Communication Skills; Physical Health; Resilience  Sleep  Sleep: Sleep: Good Number of Hours of Sleep: 8  Physical Exam: Physical Exam Vitals and nursing note reviewed.  Constitutional:      Appearance: He is normal weight.  HENT:     Head: Normocephalic.     Nose: Nose normal.  Eyes:     Extraocular Movements: Extraocular movements intact.  Cardiovascular:     Rate and Rhythm: Normal rate.     Pulses: Normal pulses.  Pulmonary:     Effort: Pulmonary effort is normal.  Abdominal:     Comments: Deferred  Genitourinary:    Comments: Deferred Musculoskeletal:        General: Normal range of motion.     Cervical back: Normal range of motion.  Skin:    General: Skin is warm.  Neurological:     General: No focal deficit present.     Mental Status: He is alert and oriented to person, place, and time. Mental status is at baseline.     Coordination: Coordination normal.  Psychiatric:        Mood and Affect: Mood normal.        Thought Content: Thought content normal.    Review of Systems  Constitutional:  Negative for chills and fever.  HENT:  Negative for hearing loss and sore throat.   Eyes:  Negative for blurred vision.  Respiratory:  Negative for cough, sputum production, shortness of breath and wheezing.   Cardiovascular:  Negative for chest pain and palpitations.  Gastrointestinal:  Negative for abdominal pain, constipation, diarrhea, heartburn, nausea and vomiting.  Genitourinary:  Negative for dysuria, frequency and urgency.  Musculoskeletal:  Negative for myalgias and neck pain.  Skin:  Negative for itching and rash.  Neurological:  Negative for dizziness, tingling, tremors and headaches.  Endo/Heme/Allergies:        See allergy listing   Psychiatric/Behavioral:  Negative for depression, hallucinations, substance abuse and suicidal ideas. The patient is nervous/anxious (Improved with medication).    Blood pressure 129/88, pulse 74, temperature 98.6 F (37 C), temperature source Oral, resp. rate 18, height 5\' 11"  (1.803 m), weight 69.3 kg, SpO2 99%. Body mass index is 21.31 kg/m.   Social History   Tobacco Use  Smoking Status Never  Smokeless Tobacco Current   Types: Chew   Tobacco Cessation:  A prescription for an FDA-approved tobacco cessation medication provided at discharge   Blood Alcohol level:  Lab Results  Component Value Date   [facility] <10 05/17/2023   ETH <10 05/15/2023    Metabolic Disorder Labs:  Lab Results  Component Value Date   HGBA1C 4.9 05/17/2023   MPG 93.93 05/17/2023   MPG 93.93 09/29/2021   No results found for: "PROLACTIN" Lab Results  Component Value Date   CHOL 152 05/17/2023   TRIG 70 05/17/2023   HDL 78 05/17/2023   CHOLHDL 1.9 05/17/2023   VLDL 14 05/17/2023  LDLCALC 60 05/17/2023   LDLCALC 55 02/19/2020   See Psychiatric Specialty Exam and Suicide Risk Assessment completed by Attending Physician prior to discharge.  Discharge destination:  Home  Is patient on multiple antipsychotic therapies at discharge:  No   Has Patient had three or more failed trials of antipsychotic monotherapy by history:  No  Recommended Plan for Multiple Antipsychotic Therapies: NA  Discharge Instructions     Diet - low sodium heart healthy   Complete by: As directed    Increase activity slowly   Complete by: As directed    Increase activity slowly   Complete by: As directed       Allergies as of 05/27/2023       Reactions   Hydroxyzine Itching, Anxiety, Other (See Comments)   "Hyper," too   Alupent [metaproterenol] Other (See Comments)   Hyperactivity   Benadryl [diphenhydramine] Other (See Comments)   Pt states that this makes him "feel insane"   Klonopin [clonazepam] Other  (See Comments)   Patient feels Ativan is a better fit for him than Klonopin- effect is not as strong.   Other Other (See Comments)   Pt reports that all antihistamines make him "feel insane"        Medication List     STOP taking these medications    COLLAGEN PO   divalproex 250 MG DR tablet Commonly known as: DEPAKOTE Replaced by: divalproex 500 MG 24 hr tablet   LORazepam 1 MG tablet Commonly known as: Ativan   OLANZapine 10 MG tablet Commonly known as: ZYPREXA   OLANZapine 5 MG tablet Commonly known as: ZYPREXA       TAKE these medications      Indication  divalproex 500 MG 24 hr tablet Commonly known as: DEPAKOTE ER Take 1 tablet (500 mg total) by mouth 2 (two) times daily. Replaces: divalproex 250 MG DR tablet  Indication: Manic Phase of Manic-Depression   gabapentin 100 MG capsule Commonly known as: NEURONTIN Take 2 capsules (200 mg total) by mouth 3 (three) times daily.  Indication: Generalized Anxiety Disorder, Agitation.   Melatonin 10 MG Tabs Take 10 mg by mouth at bedtime.  Indication: sleep   multivitamin with minerals Tabs tablet Take 1 tablet by mouth daily with breakfast. Start taking on: May 28, 2023  Indication: vitamin   nicotine 14 mg/24hr patch Commonly known as: NICODERM CQ - dosed in mg/24 hours Place 1 patch (14 mg total) onto the skin daily. Start taking on: May 28, 2023  Indication: Nicotine Addiction   nicotine polacrilex 2 MG gum Commonly known as: NICORETTE Take 1 each (2 mg total) by mouth every 4 (four) hours as needed for smoking cessation (while awake).  Indication: Nicotine Addiction   traZODone 50 MG tablet Commonly known as: DESYREL Take 1 tablet (50 mg total) by mouth at bedtime.  Indication: Trouble Sleeping        Follow-up Information     [facility]. Go on 06/03/2023.   Specialty: Behavioral Health Why: Please go to this provider on 06/03/23 at 7:00 am for  medication management services. You may also go on Monday through Friday, arrive by 7:00 am for same day service. Contact information: [address] [place] [address] [phone]        [facility] Go on 06/04/2023.   Specialty: Professional Counselor Why: Please go to this provider on 06/04/23 at 9:00 am for therapy services. You may also go on Monday  through Friday, from 9 am to 1 pm. Contact information: [address] [name] [address] [phone]                Follow-up recommendations:   Discharge Recommendations:  The patient is being discharged to home. Patient is to take his discharge medications as ordered.  See follow up above. We recommend that he participates in individual therapy to target uncontrollable agitation and substance abuse.  We recommend that he participates in therapy to target personal conflict, to improve communication skills and conflict resolution skills. Patient is to initiate/implement a contingency based behavioral model to address his behavior. We recommend that he gets AIMS scale, height, weight, blood pressure, fasting lipid panel, fasting blood sugar in three months from discharge if he's on atypical antipsychotics.  Patient will benefit from monitoring of recurrent suicidal ideation since patient is on antidepressant medication. The patient should abstain from all illicit substances and alcohol. If the patient's symptoms worsen or do not continue to improve or if the patient becomes actively suicidal or homicidal then it is recommended that the patient return to the closest hospital emergency room or call 911 for further evaluation and treatment. National Suicide Prevention Lifeline 1800-SUICIDE or 574-304-5220. Please follow up with your primary medical doctor for all other medical needs.  The patient has been educated on the possible side effects to medications and she/her guardian is to  contact a medical professional and inform outpatient provider of any new side effects of medication. He is to take regular diet and activity as tolerated.  Will benefit from moderate daily exercise. Patient and Family was educated about removing/locking any firearms, medications or dangerous products from the home.  Activity:  As tolerated Diet:  Regular Diet  Signed: Cecilie Lowers, FNP 05/27/2023, 11:23 AM

## 2023-05-27 NOTE — Group Note (Signed)
Date:  05/27/2023 Time:  9:24 AM  Group Topic/Focus:  Goals Group:   The focus of this group is to help patients establish daily goals to achieve during treatment and discuss how the patient can incorporate goal setting into their daily lives to aide in recovery. Orientation:   The focus of this group is to educate the patient on the purpose and policies of crisis stabilization and provide a format to answer questions about their admission.  The group details unit policies and expectations of patients while admitted.    Participation Level:  Active  Participation Quality:  Appropriate  Affect:  Appropriate  Cognitive:  Appropriate  Insight: Appropriate  Engagement in Group:  Engaged  Modes of Intervention:  Discussion  Additional Comments:    Bern Fare D Zackeriah Kissler 05/27/2023, 9:24 AM

## 2023-06-13 ENCOUNTER — Encounter (HOSPITAL_COMMUNITY): Payer: Self-pay | Admitting: Psychiatry

## 2023-06-13 ENCOUNTER — Ambulatory Visit (INDEPENDENT_AMBULATORY_CARE_PROVIDER_SITE_OTHER): Payer: No Typology Code available for payment source | Admitting: Psychiatry

## 2023-06-13 DIAGNOSIS — F250 Schizoaffective disorder, bipolar type: Secondary | ICD-10-CM | POA: Diagnosis not present

## 2023-06-13 DIAGNOSIS — F411 Generalized anxiety disorder: Secondary | ICD-10-CM | POA: Diagnosis not present

## 2023-06-13 MED ORDER — GABAPENTIN 100 MG PO CAPS
200.0000 mg | ORAL_CAPSULE | Freq: Three times a day (TID) | ORAL | 3 refills | Status: DC
Start: 2023-06-13 — End: 2023-10-21

## 2023-06-13 MED ORDER — DIVALPROEX SODIUM ER 500 MG PO TB24: 500.0000 mg | ORAL_TABLET | Freq: Two times a day (BID) | ORAL | 3 refills | Status: DC

## 2023-06-13 MED ORDER — TRAZODONE HCL 50 MG PO TABS: 50.0000 mg | ORAL_TABLET | Freq: Every day | ORAL | 3 refills | Status: DC

## 2023-06-13 MED ORDER — MELATONIN 10 MG PO TABS: 10.0000 mg | ORAL_TABLET | Freq: Every day | ORAL | 3 refills | Status: DC

## 2023-06-13 NOTE — Progress Notes (Addendum)
 Psychiatric Initial Adult Assessment  Virtual Visit via Video Note  I connected with Ronald [name] on 06/13/23 at  8:00 AM EST by a video enabled telemedicine application and verified that I am speaking with the correct person using two identifiers.  Location: Patient: Home Provider: Clinic   I discussed the limitations of evaluation and management by telemedicine and the availability of in person appointments. The patient expressed understanding and agreed to proceed.  I provided 45 minutes of non-face-to-face time during this encounter.    Patient Identification: ARCANGEL MINION MRN:  [id number] Date of Evaluation:  06/13/2023 Referral Source: [name] Chief Complaint:  "I would like for my Depakote to be increased to 750 twice a day" Visit Diagnosis:    ICD-10-CM   1. Schizoaffective disorder, bipolar type (HCC)  F25.0 divalproex (DEPAKOTE ER) 500 MG 24 hr tablet    gabapentin (NEURONTIN) 100 MG capsule    2. GAD (generalized anxiety disorder)  F41.1 Melatonin 10 MG TABS    traZODone (DESYREL) 50 MG tablet      History of Present Illness: 43 year old male seen today for initial psychiatric evaluation.  He was referred to outpatient psychiatry by [facility] where he presented on 05/18/2023-05/27/2023.  Per chart review patient presenting in clinic with law enforcement for worsening aggressive behaviors & paranoia at home.  He has a psychiatric history of Cannabis use, benzodiazepine dependence, bipolar 1, anxiety, paranoid schizophrenia, substance-induced mood disorder, PTSD, and schizoaffective disorder bipolar type.  Currently is managed on Depakote 500 mg twice daily, gabapentin 200 mg 3 times daily, melatonin 10 mg nightly as needed, Nicorette CQ 14 mg patches, Nicorette 2 mg gum, and trazodone 50 mg nightly as needed.  He informed Clinical research associate that he discontinued his Nicorette gum and patch because it made him feel fidgety.  Today he is well-groomed, pleasant, cooperative, and engaged in  conversation.  Patient reports that he would like his Depakote increased to 750 mg twice daily.  He notes that this was on the higher dose in the past and notes that it was prescribed by his PCP.  Today patient denies symptoms of hypomania, paranoia, or hallucinations.  He notes that his mood is stable and denies symptoms of anxiety and depression.  Provider conducted a GAD-7 and patient scored a 0.  Provider also conducted PHQ-9 and patient scored a 0.  He endorses adequate sleep and appetite.    Patient informed Clinical research associate that he has not used illegal substances (THC and benzodiazepines) since his hospitalization.  He denies alcohol use.  Patient informed Clinical research associate that he discontinued his Nicorette gum and patches because it causes him to be fidgety. He reports that he uses Skoal citrus nicotine pouches.  Patient informed writer that he has chronic pain in his right ankle.  He notes that he injured his right ankle 6 years ago when he fell.  He does Archivist that Tylenol and gabapentin are effective in managing his pain.  At this time patient informed that Depakote would not be increased.  He does not wish to continue his Nicorette patches or gum.  He will continue other medications as prescribed and follow-up with outpatient counseling for therapy.  No other concerns at this time. Associated Signs/Symptoms: Depression Symptoms:   Denies (Hypo) Manic Symptoms:   Denies Anxiety Symptoms:   Denies Psychotic Symptoms:   Denies PTSD Symptoms: Had a traumatic exposure:  did not want to discuss  Past Psychiatric History: Cannabis use, visual dependence, bipolar 1, anxiety, paranoid schizophrenia, substance-induced mood  disorder, PTSD, and schizoaffective disorder bipolar type  Previous Psychotropic Medications:  Depakote, gabapentin, Zyprexa (disliked), melatonin, nicorette patches (made fidgety), nicorette gum  (made fidgety), trazodone  Substance Abuse History in the last 12 months:  Yes.     Consequences of Substance Abuse: NA  Past Medical History:  Past Medical History:  Diagnosis Date   Anxiety    Arthritis    Disorder of pineal gland    Post traumatic stress disorder     Past Surgical History:  Procedure Laterality Date   ANKLE SURGERY      Family Psychiatric History: Mother bipolar disorder, grandfather mental health issues  Family History: No family history on file.  Social History:   Social History   Socioeconomic History   Marital status: Single    Spouse name: Not on file   Number of children: Not on file   Years of education: Not on file   Highest education level: Not on file  Occupational History   Not on file  Tobacco Use   Smoking status: Never   Smokeless tobacco: Current    Types: Chew  Vaping Use   Vaping status: Never Used  Substance and Sexual Activity   Alcohol use: No   Drug use: Yes    Types: Marijuana    Comment: one year ago   Sexual activity: Not Currently  Other Topics Concern   Not on file  Social History Narrative   Not on file   Social Drivers of Health   Financial Resource Strain: Not on file  Food Insecurity: No Food Insecurity (05/20/2023)   Hunger Vital Sign    Worried About Running Out of Food in the Last Year: Never true    Ran Out of Food in the Last Year: Never true  Transportation Needs: No Transportation Needs (05/20/2023)   PRAPARE - Administrator, Civil Service (Medical): No    Lack of Transportation (Non-Medical): No  Physical Activity: Not on file  Stress: Not on file  Social Connections: Not on file    Additional Social History: Patient resides in [place] with his mother. He is single and has no children. He uses the Skoal citrus nicotine pouches. He notes that he has been sober from [facility] since his discharge. He denies alcohol use.   Allergies:   Allergies  Allergen Reactions   Hydroxyzine Itching, Anxiety and Other (See Comments)    "Hyper," too   Alupent [Metaproterenol]  Other (See Comments)    Hyperactivity   Benadryl [Diphenhydramine] Other (See Comments)    Pt states that this makes him "feel insane"   Klonopin [Clonazepam] Other (See Comments)    Patient feels Ativan is a better fit for him than Klonopin- effect is not as strong.   Other Other (See Comments)    Pt reports that all antihistamines make him "feel insane"    Metabolic Disorder Labs: Lab Results  Component Value Date   HGBA1C 4.9 05/17/2023   MPG 93.93 05/17/2023   MPG 93.93 09/29/2021   No results found for: "PROLACTIN" Lab Results  Component Value Date   CHOL 152 05/17/2023   TRIG 70 05/17/2023   HDL 78 05/17/2023   CHOLHDL 1.9 05/17/2023   VLDL 14 05/17/2023   LDLCALC 60 05/17/2023   LDLCALC 55 02/19/2020   Lab Results  Component Value Date   TSH 2.224 05/17/2023    Therapeutic Level Labs: No results found for: "LITHIUM" No results found for: "CBMZ" Lab Results  Component Value Date  VALPROATE 46 (L) 05/27/2023    Current Medications: Current Outpatient Medications  Medication Sig Dispense Refill   divalproex (DEPAKOTE ER) 500 MG 24 hr tablet Take 1 tablet (500 mg total) by mouth 2 (two) times daily. 60 tablet 3   gabapentin (NEURONTIN) 100 MG capsule Take 2 capsules (200 mg total) by mouth 3 (three) times daily. 180 capsule 3   Melatonin 10 MG TABS Take 10 mg by mouth at bedtime. 30 tablet 3   Multiple Vitamin (MULTIVITAMIN WITH MINERALS) TABS tablet Take 1 tablet by mouth daily with breakfast.     nicotine (NICODERM CQ - DOSED IN MG/24 HOURS) 14 mg/24hr patch Place 1 patch (14 mg total) onto the skin daily. 28 patch 0   nicotine polacrilex (NICORETTE) 2 MG gum Take 1 each (2 mg total) by mouth every 4 (four) hours as needed for smoking cessation (while awake). 100 tablet 0   traZODone (DESYREL) 50 MG tablet Take 1 tablet (50 mg total) by mouth at bedtime. 30 tablet 3   No current facility-administered medications for this visit.     Musculoskeletal: Strength & Muscle Tone: within normal limits and telehealth visit Gait & Station: normal, telehealth visit Patient leans: N/A  Psychiatric Specialty Exam: Review of Systems  There were no vitals taken for this visit.There is no height or weight on file to calculate BMI.  General Appearance: Well Groomed  Eye Contact:  Good  Speech:  Clear and Coherent and Normal Rate  Volume:  Normal  Mood:  Euthymic  Affect:  Appropriate and Congruent  Thought Process:  Coherent, Goal Directed, and Linear  Orientation:  Full (Time, Place, and Person)  Thought Content:  WDL and Logical  Suicidal Thoughts:  No  Homicidal Thoughts:  No  Memory:  Immediate;   Good Recent;   Good Remote;   Good  Judgement:  Good  Insight:  Good  Psychomotor Activity:  Normal  Concentration:  Concentration: Good and Attention Span: Good  Recall:  Good  Fund of Knowledge:Good  Language: Good  Akathisia:  No  Handed:  Right  AIMS (if indicated):  not done  Assets:  Communication Skills Desire for Improvement Housing Leisure Time Social Support  ADL's:  Intact  Cognition: WNL  Sleep:  Good   Screenings: AUDIT    [name] to Hosp[facility] (Dis[name]) [name]Discharged[facility]  Alcohol Use Disorder Identification Test Final Score (AUDIT) 0 0 0      GAD-7    Flowsheet Row Office Visit from 06/13/2023 in [facility] Office Visit from 10/23/2020 in [facility] [place] [facility]  Total GAD-7 Score 0 20      PHQ2-9    Flowsheet Row Office Visit from 06/13/2023 in [facility] Office Visit from 11/08/2022 in [facility] [place] Office Visit from 01/14/2022 in [facility] [place] Office Visit from 09/19/2021 in [facility] Visit from 03/21/2021 in [facility] [place] [facility] Total Score 0 0 0 0 0  PHQ-9 Total Score 0 0 0 0 --      Flowsheet Row Admission (Canceled) from 06/13/2023 in [facility] Emergency Department at [facility] ED to Hosp-Admission (Discharged) [facility] from [facility]/17/2023 in [facility]  C-SSRS RISK CATEGORY No Risk No Risk No Risk       Assessment and Plan: Patient reports that he is doing well on his current medication regimen.  He request an increase of Depakote but denies symptoms of anxiety, depression, or mania.  Patient reports that the Nicorette patches and gum makes him fidgety and at this time request to discontinue it. No other medication changes made.  Patient agreeable to continue medication as prescribed.  1. GAD (generalized anxiety disorder)  Continue- Melatonin 10 MG TABS; Take 10 mg by mouth at bedtime.  Dispense: 30 tablet; Refill: 3 Continue- traZODone (DESYREL) 50 MG tablet; Take 1 tablet (50 mg total) by mouth at bedtime.  Dispense: 30 tablet; Refill: 3  2. Schizoaffective disorder, bipolar type (HCC) (Primary)  Continue- divalproex (DEPAKOTE ER) 500 MG 24 hr tablet; Take 1 tablet (500 mg total) by mouth 2 (two) times daily.  Dispense: 60 tablet; Refill: 3 Continue- gabapentin (NEURONTIN) 100 MG capsule; Take 2 capsules (200 mg total) by mouth 3 (three) times daily.  Dispense: 180 capsule; Refill: 3    Collaboration of Care: Other provider involved in patient's care AEB PCP, counselor, and primary psychiatric provider   Patient/Guardian was advised Release of Information must be obtained prior to any record release in order to collaborate their care with an outside provider. Patient/Guardian was advised if they have  not already done so to contact the registration department to sign all necessary forms in order for [place] to release information regarding their care.   Consent: Patient/Guardian gives verbal consent for treatment and assignment of benefits for services provided during this visit. Patient/Guardian expressed understanding and agreed to proceed.   Follow-up in 2 months with Dr. Theodoro Kos  Shanna Cisco, NP 2/28/20258:29 AM

## 2023-06-18 ENCOUNTER — Ambulatory Visit (HOSPITAL_COMMUNITY)
Admission: EM | Admit: 2023-06-18 | Discharge: 2023-06-18 | Disposition: A | Discharge status: Home / Self Care | Attending: Nurse Practitioner | Admitting: Nurse Practitioner

## 2023-06-18 DIAGNOSIS — Z59819 Housing instability, housed unspecified: Secondary | ICD-10-CM

## 2023-06-18 DIAGNOSIS — F250 Schizoaffective disorder, bipolar type: Secondary | ICD-10-CM | POA: Insufficient documentation

## 2023-06-18 DIAGNOSIS — F4310 Post-traumatic stress disorder, unspecified: Secondary | ICD-10-CM | POA: Insufficient documentation

## 2023-06-18 DIAGNOSIS — Z765 Malingerer [conscious simulation]: Secondary | ICD-10-CM

## 2023-06-18 NOTE — Progress Notes (Signed)
   06/18/23 2013  [facility] Triage Screening (Walk-ins at [facility] only)  How Did You Hear About [place]? Self  What Is the Reason for Your Visit/Call Today? Pt presents to [facility] as a voluntary walk-in,unaccompanied with complaint of panic attacks/anxiety. Pt arrived to [facility] crying uncontrollably, shaking, but managed to gain control of his emotions shortly after. Pt reports diagnosis of PTSD, anxiety, bipolar. Pt reports the last two weeks he has had severe panic attacks and nightmares. Pt reports being prescribed Klonopin, Lorazepam, Trazadone, Gabapentin. Pt states he is compliant, but feels that medications are not working. Pt currently denies SI,HI,AVH and substance/alcohol use.  How Long Has This Been Causing You Problems? 1 wk - 1 month  Have You Recently Had Any Thoughts About Hurting Yourself? No  Are You Planning to Commit Suicide/Harm Yourself At This time? No  Have you Recently Had Thoughts About Hurting Someone [name]? No  Are You Planning To Harm Someone At This Time? No  Explanation: pt denies  Physical Abuse Denies  Verbal Abuse Denies  Sexual Abuse Denies  Exploitation of patient/patient's resources Denies  Self-Neglect Denies  Are you currently experiencing any auditory, visual or other hallucinations? No  Have You Used Any Alcohol or Drugs in the Past 24 Hours? No  Do you have any current medical co-morbidities that require immediate attention? No  Clinician description of patient physical appearance/behavior: initially arrived crying uncontrollably, but has calmed down significantly. Pt is now calm, cooperative, pleasant  What Do You Feel Would Help You the Most Today? Social Support;Treatment for Depression or other mood problem;Medication(s)  If access to [facility] was not available, would you have sought care in the Emergency Department? No  Determination of Need Routine (7 days)  Options For Referral Other: Comment;Outpatient Therapy;Medication Management

## 2023-06-18 NOTE — ED Provider Notes (Signed)
 Behavioral Health Urgent Care Medical Screening Exam  Patient Name: Ronald [name] MRN: [id number] Date of Evaluation: 06/18/23 Chief Complaint:  " I need lorazepam, gabapentin and klonopin now!" Diagnosis:  Final diagnoses:  Drug-seeking behavior  Housing instability, housed unspecified    History of Present illness: Ronald [name] is a 43 y.o. male.  With psychiatric history of schizoaffective disorder bipolar type, PTSD, substance-induced mood disorder, schizophrenia paranoid type, and GAD, who presented voluntarily via [facility] to [facility] with complaints of anxiety and demanding for lorazepam and Klonopin.  Patient was seen face-to-face by this provider and chart reviewed with Dr Woodroe Mode. Per chart review, patient was at the behavioral health Hospital from 05/18/23-05/28/23 for benzodiazepine abuse and anxiety. Patient has history of several inpatient psychiatric hospitalizations, with 5 ED/urgent care visits within the past 6 months. PDMP reviewed and showed patient was prescribed gabapentin 100 mg, 180 pills for 30 days, 05/27/2023, and lorazepam 1 mg, 10 pills x 3 days within 05/15/2023. The records indicate the patient is not on active benzo or gabapentin treatment.  On evaluation, patient is alert, oriented x 3, and uncooperative. Speech is clear, and coherent. Pt appears casually dressed. Eye contact is fair. Mood is anxious, affect is congruent with mood. Thought process is coherent and goal directed and thought content is WDL. Pt denies SI/HI/AVH. There is no objective indication that the patient is responding to internal stimuli. No delusions elicited during this assessment.    Patient presents as anxious and demanding and states "I need lorazepam, gabapentin and Klonopin now because I'm having anxiety and panic attacks and I'm breathing through it". Patient reports he was prescribed these medications by a "doctor" in the ED a while back for PTSD, anxiety and panic attacks because "I lost a  lot of family members in the past, and I need a bed, I need long-term treatment, I can't go back home because my mother has cancer, I need to be in a hospital bed, I need to be sent some place I can stay long term because my mother has cancer and I can't go home".  Patient reports he did not follow up with his outpatient psychiatric provider as recommended following his inpatient hospitalization and discharge at the [facility].  Patient reports he is established with [facility] outpatient walk-in clinic and agrees to follow up in the morning, however he became uncooperative and angry when he informed he will not be given lorazepam, gabapentin, and Klonopin.  Patient is offered hydroxyzine 25 mg p.o. for anxiety.  Patient refuses, stating "I don't need that, if you won't give me the medications I asked for, I'm going to leave and go over to the hospital".  Discussed recommendation for discharge and follow-up with [facility] outpatient walk-in clinic in the morning.  No safety risks identified.  Patient appears more drug-seeking and refuses alternative offered.    [name] from 06/18/2023 in [facility] Admission (Canceled) from 06/18/2023 in [facility] Emergency Department at [facility] ED to Hosp-Admission (Discharged) [name]  C-SSRS RISK CATEGORY No Risk No Risk No Risk       Psychiatric Specialty Exam  Presentation  General Appearance:Casual  Eye Contact:Fair  Speech:Clear and Coherent  Speech Volume:Normal  Handedness:Right   Mood and Affect  Mood: Anxious  Affect: Congruent   Thought Process  Thought Processes: Coherent; Goal Directed  Descriptions of Associations:Intact  Orientation:Full (Time, Place and Person)  Thought Content:WDL  Diagnosis  of Schizophrenia or Schizoaffective disorder in past: Yes  Duration of Psychotic Symptoms: Less than six months   Hallucinations:None Denies  Ideas of Reference:None  Suicidal Thoughts:No  Homicidal Thoughts:No   Sensorium  Memory: Immediate Fair  Judgment: Intact  Insight: Shallow   Executive Functions  Concentration: Good  Attention Span: Good  Recall: Good  Fund of Knowledge: Good  Language: Good   Psychomotor Activity  Psychomotor Activity: Mannerisms   Assets  Assets: Communication Skills; Desire for Improvement   Sleep  Sleep: Good  Number of hours:  8   Physical Exam: Physical Exam Constitutional:      General: He is not in acute distress.    Appearance: He is not diaphoretic.  HENT:     Head: Normocephalic.     Right Ear: External ear normal.     Left Ear: External ear normal.     Nose: No congestion.  Eyes:     General:        Right eye: No discharge.        Left eye: No discharge.  Cardiovascular:     Rate and Rhythm: Normal rate.  Pulmonary:     Effort: No respiratory distress.  Chest:     Chest wall: No tenderness.  Neurological:     Mental Status: He is alert and oriented to person, place, and time.  Psychiatric:        Attention and Perception: Attention and perception normal.        Mood and Affect: Mood is anxious.        Speech: Speech normal.        Behavior: Behavior normal.        Thought Content: Thought content normal.    Review of Systems  Constitutional:  Negative for chills, diaphoresis and fever.  HENT:  Negative for congestion.   Eyes:  Negative for discharge.  Respiratory:  Negative for cough, shortness of breath and wheezing.   Cardiovascular:  Negative for chest pain and palpitations.  Gastrointestinal:  Negative for diarrhea, nausea and vomiting.  Neurological:  Negative for dizziness, loss of consciousness and headaches.  Psychiatric/Behavioral:  The patient is nervous/anxious.    Blood pressure (!) 131/93, pulse 85, temperature 98.4 F (36.9 C), temperature source Oral, resp. rate 20, SpO2 98%. There  is no height or weight on file to calculate BMI.  Musculoskeletal: Strength & Muscle Tone: within normal limits Gait & Station: normal Patient leans: N/A   [facility] MSE Discharge Disposition for Follow up and Recommendations: Based on my evaluation the patient does not appear to have an emergency medical condition and can be discharged with resources and follow up care in outpatient services for Medication Management and Individual Therapy  Patient denies SI/HI/AVH or paranoia.  Patient does not meet inpatient psychiatric admission criteria or IVC criteria at this time.  There is no evidence of imminent risk of harm to self or others.  Recommend discharge and follow-up with [facility] outpatient walk-in clinic in the a.m. for medication management.  Recommend hydroxyzine 25 mg p.o. x 1 for anxiety.  Patient refused. Discharge recommendations:  Patient is to take medications as prescribed. . Please follow up with your primary care provider for all medical related needs.   Therapy: We recommend that patient participate in individual therapy to address mental health concerns.  Medications: The patient or guardian is to contact a medical professional and/or outpatient provider to address any new side effects that develop. The patient or guardian should update outpatient  providers of any new medications and/or medication changes.   Atypical antipsychotics: If you are prescribed an atypical antipsychotic, it is recommended that your height, weight, BMI, blood pressure, fasting lipid panel, and fasting blood sugar be monitored by your outpatient providers.  Safety:  The patient should abstain from use of illicit substances/drugs and abuse of any medications. If symptoms worsen or do not continue to improve or if the patient becomes actively suicidal or homicidal then it is recommended that the patient return to the closest hospital emergency department, the [facility], or  call 911 for further evaluation and treatment. National Suicide Prevention Lifeline 1-800-SUICIDE or 365 270 4996.  About 988 988 offers 24/7 access to trained crisis counselors who can help people experiencing mental health-related distress. People can call or text 988 or chat 988lifeline.org for themselves or if they are worried about a loved one who may need crisis support.  Crisis Mobile: [facility]:                     (435) 854-2785 (for crisis response 24 hours a day) [facility] Hotline:                                            (872)131-3170  Patient discharged in stable condition.  [name], NP 06/18/2023, 9:33 PM

## 2023-06-18 NOTE — ED Notes (Signed)
 Pt came in hallway demanding his medications now writer explained I will speak to doctor NP Chinwendu explained he will be d/c and Clinical research associate gave information to OP [facility] clinic he wen to [place] and [employer] was called to transport back home

## 2023-06-18 NOTE — Discharge Instructions (Signed)

## 2023-06-19 ENCOUNTER — Ambulatory Visit (HOSPITAL_COMMUNITY)
Admission: EM | Admit: 2023-06-19 | Discharge: 2023-06-19 | Disposition: A | Discharge status: Home / Self Care | Attending: Family | Admitting: Family

## 2023-06-19 DIAGNOSIS — F411 Generalized anxiety disorder: Secondary | ICD-10-CM | POA: Insufficient documentation

## 2023-06-19 DIAGNOSIS — Z79899 Other long term (current) drug therapy: Secondary | ICD-10-CM | POA: Insufficient documentation

## 2023-06-19 MED ORDER — QUETIAPINE FUMARATE 50 MG PO TABS: 50.0000 mg | ORAL_TABLET | Freq: Every day | ORAL | 0 refills | Status: DC

## 2023-06-19 MED ORDER — QUETIAPINE FUMARATE 50 MG PO TABS
50.0000 mg | ORAL_TABLET | Freq: Every day | ORAL | Status: DC
  Filled 2023-06-19: qty 7

## 2023-06-19 NOTE — ED Notes (Signed)
 This RN received report from EMS. Per report, pt is coming from home. He has been experiencing panic attacks x2 days. He denies any SI, HI, AVH, chest pain, or SOB. He states he has also been having nightmares about family members who have passed away. He also reports decreased sleep and a decreased appetite. He states he was recently admitted inpatient last month and believes it helped. Denis any drug or alcohol use.

## 2023-06-19 NOTE — ED Notes (Signed)
 Patient discharged by provider.

## 2023-06-19 NOTE — Discharge Instructions (Addendum)
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
## 2023-06-19 NOTE — ED Provider Notes (Addendum)
 Behavioral Health Urgent Care Medical Screening Exam  Patient Name: Ronald [name] MRN: [id number] Date of Evaluation: 06/19/23 Chief Complaint:   "I need to see Dr Alessandra Grout, for my medications." Diagnosis:  Final diagnoses:  GAD (generalized anxiety disorder)    History of Present illness: Ronald [name] is a 43 y.o. male.  Presents to [facility] urgent care reporting increased anxiety.  Patient was recently seen and evaluated and was advised to follow-up with outpatient walk-in services.  He reports he does not feel that his medications is helping. " I only want to talk to Dr. Alessandra Grout."  He reports his medication was adjusted recently inpatient and feels that another inpatient admission would be helpful.   Ronald [name] is denying suicidal or homicidal ideations.  Denies auditory visual hallucinations.  He reports he is currently prescribed gabapentin and Depakote.  Patient reports he has been taking and tolerating medications well. Patient has medications in hand.   [name] provided verbal authorization for this provider to follow-up with his mother for additional collateral.  NP spoke to [name] (mother) at [phone].  She denied any safety concerns with patient returning home.  Mother Ronald [name] stated that he does present every 2 years with bizarre behavior. Stated patient has a history with schizophrenia, bipolar disorder, major depressive disorder.  Reports he is often afraid to use the telephone.  States she had to leave one of her cancer treatments to assist medications in a crisis.  She reports that Ronald [name]  gets fixated on "aliens" and is often paranoid.  She reports he has been taking his medication however she does not know the name of medication that he is currently prescribed.  This provider attempted to have patient follow-up with outpatient services for medication adjustments as he is recently seen and evaluated by nurse practitioner Doyne Keel. 2/28 with a scheduled appointment with  psychiatrist [place] 4/8.  Patient was reluctant to outpatient follow-up appointment.  Per chart review patient has refused any antipsychotic medications as he reports adverse reactions with Zyprexa and Risperdal.  Patient was amendable to starting Seroquel 50 mg nightly.   [name] is sitting in no acute distress. He is alert/oriented x 4; calm/cooperative; and mood congruent with affect. he is speaking in a clear tone at moderate volume, and normal pace; with good eye contact. His  thought process is coherent and relevant; There is no indication that he is currently responding to internal/external stimuli or experiencing delusional thought content; and he has denied suicidal/self-harm/homicidal ideation, psychosis, and paranoia.   Patient has remained calm throughout assessment and has answered questions appropriately.    Ronald [name] is educated and verbalizes understanding of mental health resources and other crisis services in the community.he is instructed to call 911 and present to the nearest emergency room should he experience any suicidal/homicidal ideation, auditory/visual/hallucinations, or detrimental worsening of his mental health condition. He was a also advised by Clinical research associate that he could call the toll-free phone on insurance card to assist with identifying in network counselors and agencies or number on back of Medicaid card t speak with care coordinator   [name] from 06/19/2023 in [name] ED from 06/18/2023 in [name] ED to Hosp-Admission (Discharged) [name]  C-SSRS RISK CATEGORY No Risk No Risk No Risk       Psychiatric Specialty Exam  Presentation  General Appearance:Casual  Eye Contact:Fair  Speech:Clear and Coherent  Speech  Volume:Normal  Handedness:Right   Mood and Affect  Mood: Anxious  Affect: Congruent   Thought Process   Thought Processes: Coherent; Goal Directed  Descriptions of Associations:Intact  Orientation:Full (Time, Place and Person)  Thought Content:WDL  Diagnosis of Schizophrenia or Schizoaffective disorder in past: Yes  Duration of Psychotic Symptoms: Less than six months  Hallucinations:None Denies  Ideas of Reference:None  Suicidal Thoughts:No  Homicidal Thoughts:No   Sensorium  Memory: Immediate Fair  Judgment: Intact  Insight: Shallow   Executive Functions  Concentration: Good  Attention Span: Good  Recall: Good  Fund of Knowledge: Good  Language: Good   Psychomotor Activity  Psychomotor Activity: Mannerisms   Assets  Assets: Communication Skills; Desire for Improvement   Sleep  Sleep: Good  Number of hours:  8   Physical Exam: Physical Exam Vitals and nursing note reviewed.  Cardiovascular:     Rate and Rhythm: Normal rate and regular rhythm.  Neurological:     Mental Status: He is alert and oriented to person, place, and time.  Psychiatric:        Mood and Affect: Mood normal.        Behavior: Behavior normal.    ROS There were no vitals taken for this visit. There is no height or weight on file to calculate BMI.  Musculoskeletal: Strength & Muscle Tone: within normal limits Gait & Station: normal Patient leans: N/A   [facility] MSE Discharge Disposition for Follow up and Recommendations: Based on my evaluation the patient does not appear to have an emergency medical condition and can be discharged with resources and follow up care in outpatient services for Medication Management -Initiated Seroquel 50 mg p.o. nightly x 7 days.  Patient to follow-up with [facility] outpatient walk-in services.   [name], NP 06/19/2023, 8:28 AM

## 2023-06-26 ENCOUNTER — Other Ambulatory Visit: Payer: Self-pay

## 2023-06-26 ENCOUNTER — Encounter (HOSPITAL_COMMUNITY): Payer: Self-pay

## 2023-06-26 ENCOUNTER — Emergency Department (HOSPITAL_COMMUNITY)
Admission: EM | Admit: 2023-06-26 | Discharge: 2023-06-29 | Disposition: A | Discharge status: Other Acute Inpatient Hospital | Payer: Self-pay | Attending: Student | Admitting: Student

## 2023-06-26 DIAGNOSIS — F411 Generalized anxiety disorder: Secondary | ICD-10-CM | POA: Diagnosis present

## 2023-06-26 DIAGNOSIS — F3112 Bipolar disorder, current episode manic without psychotic features, moderate: Secondary | ICD-10-CM | POA: Diagnosis not present

## 2023-06-26 DIAGNOSIS — Z79899 Other long term (current) drug therapy: Secondary | ICD-10-CM | POA: Insufficient documentation

## 2023-06-26 DIAGNOSIS — F17200 Nicotine dependence, unspecified, uncomplicated: Secondary | ICD-10-CM | POA: Insufficient documentation

## 2023-06-26 DIAGNOSIS — F419 Anxiety disorder, unspecified: Secondary | ICD-10-CM | POA: Insufficient documentation

## 2023-06-26 DIAGNOSIS — F319 Bipolar disorder, unspecified: Secondary | ICD-10-CM | POA: Insufficient documentation

## 2023-06-26 DIAGNOSIS — F22 Delusional disorders: Secondary | ICD-10-CM | POA: Insufficient documentation

## 2023-06-26 DIAGNOSIS — F29 Unspecified psychosis not due to a substance or known physiological condition: Secondary | ICD-10-CM

## 2023-06-26 DIAGNOSIS — F4310 Post-traumatic stress disorder, unspecified: Secondary | ICD-10-CM | POA: Insufficient documentation

## 2023-06-26 LAB — COMPREHENSIVE METABOLIC PANEL
ALT: 12 U/L (ref 0–44)
AST: 13 U/L — ABNORMAL LOW (ref 15–41)
Albumin: 4.2 g/dL (ref 3.5–5.0)
Alkaline Phosphatase: 41 U/L (ref 38–126)
Anion gap: 9 (ref 5–15)
BUN: 11 mg/dL (ref 6–20)
CO2: 25 mmol/L (ref 22–32)
Calcium: 9.2 mg/dL (ref 8.9–10.3)
Chloride: 102 mmol/L (ref 98–111)
Creatinine, Ser: 0.86 mg/dL (ref 0.61–1.24)
GFR, Estimated: >60 mL/min (ref >60)
Glucose, Bld: 103 mg/dL — ABNORMAL HIGH (ref 70–99)
Potassium: 4 mmol/L (ref 3.5–5.1)
Sodium: 136 mmol/L (ref 135–145)
Total Bilirubin: 0.7 mg/dL (ref 0.0–1.2)
Total Protein: 7 g/dL (ref 6.5–8.1)

## 2023-06-26 LAB — CBC WITH DIFFERENTIAL/PLATELET
Abs Immature Granulocytes: 0.01 10*3/uL (ref 0.00–0.07)
Basophils Absolute: 0 10*3/uL (ref 0.0–0.1)
Basophils Relative: 0 %
Eosinophils Absolute: 0 10*3/uL (ref 0.0–0.5)
Eosinophils Relative: 0 %
HCT: 44.4 % (ref 39.0–52.0)
Hemoglobin: 14.9 g/dL (ref 13.0–17.0)
Immature Granulocytes: 0 %
Lymphocytes Relative: 25 %
Lymphs Abs: 1.7 10*3/uL (ref 0.7–4.0)
MCH: 30 pg (ref 26.0–34.0)
MCHC: 33.6 g/dL (ref 30.0–36.0)
MCV: 89.5 fL (ref 80.0–100.0)
Monocytes Absolute: 0.6 10*3/uL (ref 0.1–1.0)
Monocytes Relative: 8 %
Neutro Abs: 4.5 10*3/uL (ref 1.7–7.7)
Neutrophils Relative %: 67 %
Platelets: 277 10*3/uL (ref 150–400)
RBC: 4.96 MIL/uL (ref 4.22–5.81)
RDW: 12 % (ref 11.5–15.5)
WBC: 6.9 10*3/uL (ref 4.0–10.5)
nRBC: 0 % (ref 0.0–0.2)

## 2023-06-26 LAB — URINALYSIS, ROUTINE W REFLEX MICROSCOPIC
Bilirubin Urine: NEGATIVE
Glucose, UA: NEGATIVE mg/dL
Hgb urine dipstick: NEGATIVE
Ketones, ur: NEGATIVE mg/dL
Leukocytes,Ua: NEGATIVE
Nitrite: NEGATIVE
Protein, ur: NEGATIVE mg/dL
Specific Gravity, Urine: 1.019 (ref 1.005–1.030)
pH: 6 (ref 5.0–8.0)

## 2023-06-26 LAB — RAPID URINE DRUG SCREEN, HOSP PERFORMED
Amphetamines: NOT DETECTED
Barbiturates: NOT DETECTED
Benzodiazepines: NOT DETECTED
Cocaine: NOT DETECTED
Opiates: NOT DETECTED
Tetrahydrocannabinol: POSITIVE — AB

## 2023-06-26 MED ORDER — QUETIAPINE FUMARATE 50 MG PO TABS: 50.0000 mg | ORAL_TABLET | Freq: Every day | ORAL | Status: DC

## 2023-06-26 MED ORDER — DIVALPROEX SODIUM ER 500 MG PO TB24
500.0000 mg | ORAL_TABLET | Freq: Two times a day (BID) | ORAL | Status: DC
  Administered 2023-06-28 – 2023-06-29 (×3): 500 mg via ORAL
  Filled 2023-06-26 (×6): qty 1

## 2023-06-26 MED ORDER — MELATONIN 3 MG PO TABS
3.0000 mg | ORAL_TABLET | Freq: Every day | ORAL | Status: DC
  Administered 2023-06-26 – 2023-06-28 (×3): 3 mg via ORAL
  Filled 2023-06-26 (×3): qty 1

## 2023-06-26 MED ORDER — NICOTINE POLACRILEX 2 MG MT GUM
2.0000 mg | CHEWING_GUM | OROMUCOSAL | Status: DC | PRN
  Administered 2023-06-28 (×5): 2 mg via ORAL
  Filled 2023-06-26 (×6): qty 1

## 2023-06-26 MED ORDER — TRAZODONE HCL 50 MG PO TABS
50.0000 mg | ORAL_TABLET | Freq: Every day | ORAL | Status: DC
  Administered 2023-06-26 – 2023-06-28 (×3): 50 mg via ORAL
  Filled 2023-06-26 (×3): qty 1

## 2023-06-26 MED ORDER — GABAPENTIN 100 MG PO CAPS
200.0000 mg | ORAL_CAPSULE | Freq: Three times a day (TID) | ORAL | Status: DC
Start: 2023-06-26 — End: 2023-06-29
  Administered 2023-06-26 – 2023-06-29 (×9): 200 mg via ORAL
  Filled 2023-06-26 (×9): qty 2

## 2023-06-26 MED ORDER — ZIPRASIDONE MESYLATE 20 MG IM SOLR: 20.0000 mg | Freq: Once | INTRAMUSCULAR | Status: DC

## 2023-06-26 MED ORDER — GABAPENTIN 100 MG PO CAPS: 200.0000 mg | ORAL_CAPSULE | Freq: Three times a day (TID) | ORAL | Status: DC

## 2023-06-26 MED ORDER — NICOTINE 21 MG/24HR TD PT24
21.0000 mg | MEDICATED_PATCH | Freq: Once | TRANSDERMAL | Status: AC
  Administered 2023-06-26: 21 mg via TRANSDERMAL
  Filled 2023-06-26: qty 1

## 2023-06-26 NOTE — ED Provider Notes (Signed)
 [place] EMERGENCY DEPARTMENT AT [facility] Provider Note  CSN: [id number] Arrival date & time: 06/26/23 1248  Chief Complaint(s) Psychiatric Evaluation  HPI Ronald [name] is a 43 y.o. male with PMH anxiety, PTSD, benzodiazepine abuse, recent hospitalization in February 2025 for aggressive behavior, schizoaffective disorder who presents emergency department for evaluation of agitation and paranoia.  Patient is increasingly fixated on aliens and [place] and believes aliens have implanted a device in his wrist.  Also concerned that they are manipulating his hormones through his pineal gland and is requesting Depakote and psychiatric admission.  Patient arrives with pressured speech, flight of ideas and paperwork containing various mathematical formulas and scientific ideas.  He believes that there is a 5G field over his house and the Depakote shield him from this.  Was seen recently in the behavioral health urgent care on 06/19/2023 for similar complaints but ultimately discharged.  However he appears to be significant more agitated today than at that encounter.  Denies any medical complaints including chest pain, shortness of breath, Donnell pain, nausea, vomiting or other systemic symptoms.   Past Medical History Past Medical History:  Diagnosis Date   Anxiety    Arthritis    Disorder of pineal gland    Post traumatic stress disorder    Patient Active Problem List   Diagnosis Date Noted   Cannabis use disorder 05/19/2023   Schizoaffective disorder, bipolar type (HCC) 05/18/2023   Encounter for screening for HIV 11/08/2022   Need for hepatitis C screening test 11/08/2022   Therapeutic drug monitoring 11/08/2022   Bipolar 1 disorder, depressed, full remission (HCC) 11/08/2022   PTSD (post-traumatic stress disorder) 09/27/2021   Severe bipolar I disorder, recurrent manic episode, with psychotic behavior (HCC) 08/13/2021   Bipolar 1 disorder with moderate mania (HCC)  06/03/2021   Substance induced mood disorder (HCC) 02/19/2020   Mild tetrahydrocannabinol (THC) abuse 11/23/2018   Schizophrenia, paranoid type (HCC) 12/23/2016   GAD (generalized anxiety disorder) 12/15/2016   Bipolar I disorder, single manic episode, severe with psychotic features (HCC) 09/29/2015   Disorder of pineal gland    Home Medication(s) Prior to Admission medications   Medication Sig Start Date End Date Taking? Authorizing Provider  divalproex (DEPAKOTE ER) 500 MG 24 hr tablet Take 1 tablet (500 mg total) by mouth 2 (two) times daily. 06/13/23   Shanna Cisco, NP  gabapentin (NEURONTIN) 100 MG capsule Take 2 capsules (200 mg total) by mouth 3 (three) times daily. 06/13/23   Shanna Cisco, NP  Melatonin 10 MG TABS Take 10 mg by mouth at bedtime. 06/13/23   Shanna Cisco, NP  Multiple Vitamin (MULTIVITAMIN WITH MINERALS) TABS tablet Take 1 tablet by mouth daily with breakfast. 05/28/23   Massengill, Harrold Donath, MD  nicotine (NICODERM CQ - DOSED IN MG/24 HOURS) 14 mg/24hr patch Place 1 patch (14 mg total) onto the skin daily. 05/28/23   Massengill, Harrold Donath, MD  nicotine polacrilex (NICORETTE) 2 MG gum Take 1 each (2 mg total) by mouth every 4 (four) hours as needed for smoking cessation (while awake). 05/27/23   Massengill, Harrold Donath, MD  QUEtiapine (SEROQUEL) 50 MG tablet Take 1 tablet (50 mg total) by mouth at bedtime. 06/19/23   [name], NP  traZODone (DESYREL) 50 MG tablet Take 1 tablet (50 mg total) by mouth at bedtime. 06/13/23   Shanna Cisco, NP  Past Surgical History Past Surgical History:  Procedure Laterality Date   ANKLE SURGERY     Family History History reviewed. No pertinent family history.  Social History Social History   Tobacco Use   Smoking status: Never   Smokeless tobacco: Current    Types: Chew  Vaping Use    Vaping status: Never Used  Substance Use Topics   Alcohol use: No   Drug use: Yes    Types: Marijuana    Comment: one year ago   Allergies Hydroxyzine, Alupent [metaproterenol], Benadryl [diphenhydramine], Klonopin [clonazepam], and Other  Review of Systems Review of Systems  Psychiatric/Behavioral:  Positive for agitation and hallucinations. The patient is hyperactive.     Physical Exam Vital Signs  I have reviewed the triage vital signs BP (!) 138/94 (BP Location: Left Arm)   Pulse (!) 104   Temp 99.3 F (37.4 C) (Oral)   Resp 17   Ht 5\' 11"  (1.803 m)   Wt 69 kg   SpO2 99%   BMI 21.22 kg/m   Physical Exam Vitals and nursing note reviewed.  Constitutional:      General: He is not in acute distress.    Appearance: He is well-developed.  HENT:     Head: Normocephalic and atraumatic.  Eyes:     Conjunctiva/sclera: Conjunctivae normal.  Cardiovascular:     Rate and Rhythm: Normal rate and regular rhythm.     Heart sounds: No murmur heard. Pulmonary:     Effort: Pulmonary effort is normal. No respiratory distress.     Breath sounds: Normal breath sounds.  Abdominal:     Palpations: Abdomen is soft.     Tenderness: There is no abdominal tenderness.  Musculoskeletal:        General: No swelling.     Cervical back: Neck supple.  Skin:    General: Skin is warm and dry.     Capillary Refill: Capillary refill takes less than 2 seconds.  Neurological:     Mental Status: He is alert.  Psychiatric:     Comments: Hyperactive, flight of ideas     ED Results and Treatments Labs (all labs ordered are listed, but only abnormal results are displayed) Labs Reviewed - No data to display                                                                                                                        Radiology No results found.  Pertinent labs & imaging results that were available during my care of the patient were reviewed by me and considered in my medical decision  making (see MDM for details).  Medications Ordered in ED Medications - No data to display  Procedures Procedures  (including critical care time)  Medical Decision Making / ED Course   This patient presents to the ED for concern of agitation, paranoid delusions, this involves an extensive number of treatment options, and is a complaint that carries with it a high risk of complications and morbidity.  The differential diagnosis includes psychosis, fixed delusions, mania, electrolyte abnormality, substance abuse  MDM: Patient seen emergency room for evaluation of agitation and paranoia.  Physical exam is unremarkable.  Laboratory evaluation with THC positive UDS but is otherwise unremarkable.  Patient medically clear and pending TTS evaluation.  Please see provider signout note for continuation of workup.   Additional history obtained:  -External records from outside source obtained and reviewed including: Chart review including previous notes, labs, imaging, consultation notes  Medicines ordered and prescription drug management: No orders of the defined types were placed in this encounter.   -I have reviewed the patients home medicines and have made adjustments as needed  Critical interventions none  Consultations Obtained: I requested consultation with the TTS providers,  and discussed lab and imaging findings as well as pertinent plan - they recommend: Recommendations are pending   Social Determinants of Health:  Factors impacting patients care include: none   Reevaluation: After the interventions noted above, I reevaluated the patient and found that they have :stayed the same  Co morbidities that complicate the patient evaluation  Past Medical History:  Diagnosis Date   Anxiety    Arthritis    Disorder of pineal gland    Post traumatic  stress disorder       Dispostion: I considered admission for this patient, and patient disposition pending TTS evaluation.  Please see provider signout for continuation of workup.     Final Clinical Impression(s) / ED Diagnoses Final diagnoses:  None     @PCDICTATION @    Glendora Score, MD 06/26/23 1528

## 2023-06-26 NOTE — ED Triage Notes (Signed)
 Patient presented to ER for paranoid delusions concerning aliens.

## 2023-06-26 NOTE — Consult Note (Signed)
 [facility] Psychiatric Consult Initial  Patient Name: .Ronald [name]  MRN: [id number]  DOB: 08/09/1980  Consult Order details:  Orders (From admission, onward)     Start     Ordered   06/26/23 1344  CONSULT TO CALL ACT TEAM       Ordering Provider: Glendora Score, MD  Provider:  (Not yet assigned)  Question:  Reason for Consult?  Answer:  paranoid delusions, flight of ideas   06/26/23 1343             Mode of Visit: In person    Psychiatry Consult Evaluation  Service Date: June 26, 2023 LOS:  LOS: 0 days  Chief Complaint paranoid and delusional  Primary Psychiatric Diagnoses  PTSD 2.   Bipolar 1 3.   Anxiety   Assessment  Ronald [name] is a 43 y.o. male admitted: Presented to the ED on 06/26/2023  1:10 PM due to being fixated on aliens and [place] and believes aliens have implanted a device in his wrist. He carries the psychiatric diagnoses of Bipolar disorder, anxiety, and PTSD and has a past medical history of none .    [name], 43 y.o., male patient seen face to face by this provider, consulted with Dr. Enedina Finner; and chart reviewed on 06/26/23.  On evaluation Ronald [name] patient is sitting up in the bed, and writing math equations, and drawing pictures. 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  He becomes excitable and states that the invasion is happening tonight, he states that they are putting chips in people, he show this provider where he feels that he has an alien chip implanted in his right hand.  Patient appears to be manic, delusionally preoccupied,  patient is a poor historian as he is focused on the alien ship invading earth tonight.  Patient denies alcohol abuse, no BAL reported, patient UDS is positive for THC. He is alert and oriented x3; speech is clear but very pressured. He responds to questions but offers circumstantial details and it is hard to interrupt him to move on.   During evaluation Ronald [name] is sitting up in his gurney writing and drawing, and appears to be in distress. He is alert, oriented to self and environment, anxious. His mood is labile with congruent affect. He has rapid, pressured speech, and behavior.  Objectively there is evidence of psychosis/mania or delusional thinking. He denies suicidal/self-harm/homicidal ideation, psychosis, and paranoia. On initial examination, patient is labile, agitated and anxious. Please see plan below for detailed recommendations.   Diagnoses:  Active Hospital problems: Principal Problem:   Bipolar 1 disorder with moderate mania (HCC) Active Problems:   GAD (generalized anxiety disorder)    Plan   ## Psychiatric Medication Recommendations:  Continue patient's home medications  ## Medical Decision Making Capacity: Not specifically addressed in this encounter  ## Further Work-up:  -- Patient is BAL is needed EKG, U/A, or UDS -- will get an updated EKG -- Pertinent labwork reviewed earlier this admission includes: EKG, UDS, UA, CBC   ## Disposition:-- We recommend inpatient psychiatric hospitalization. Patient  has been involuntarily committed on 06/26/23.   ## Behavioral / Environmental: -To minimize splitting of staff, assign one staff person to communicate all information from the team when feasible. or Utilize compassion and acknowledge the patient's experiences while setting clear and realistic expectations for care.    ## Safety and Observation Level:  - Based on my clinical evaluation, I estimate the patient to be at low risk of self harm in the current setting. -  At this time, we recommend  routine. This decision is based on my review of the chart including patient's history and current presentation, interview of the patient, mental status examination, and consideration of suicide risk including evaluating suicidal ideation, plan, intent, suicidal or self-harm behaviors, risk factors, and protective factors. This judgment is based on our ability to directly address suicide risk, implement suicide prevention strategies, and develop a safety plan while the patient is in the clinical setting. Please contact our team if there is a concern that risk level has changed.  CSSR Risk Category:C-SSRS RISK CATEGORY: No Risk  Suicide Risk Assessment: Patient has following modifiable risk factors for suicide: social isolation, recklessness, and medication noncompliance, which we are addressing by recommending inpatient psychiatric admission. Patient has following non-modifiable or demographic risk factors for suicide: male gender, history of self harm behavior, and psychiatric hospitalization Patient has the following protective factors against suicide: Access to outpatient mental health care and Supportive family  Thank you for this consult request. Recommendations have been communicated to the primary team.  We will recommend inpatient psychiatric admission at this time.   Alona Bene, PMHNP       History of Present Illness  Relevant Aspects of Hospital ED Course:  Admitted on 06/26/2023 for being fixated on aliens and [place] and believes aliens have implanted a device in his wrist.   Patient Report:  Ronald [name], 43 y.o., male patient seen face to face by this provider, consulted with Dr. Enedina Finner; and chart reviewed on 06/26/23.  On evaluation Ronald [name] patient is sitting up in the bed, and writing math equations, and drawing pictures. 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  He becomes excitable and states that the invasion is happening tonight, he states that they are putting chips in people, he show this provider where he feels that he has an alien chip implanted in his right hand.  Patient appears to be manic, delusionally preoccupied, patient is a poor historian as he is focused on the alien ship invading earth tonight.  Patient denies alcohol abuse, no BAL reported, patient UDS is positive for THC. He is alert and oriented x3; speech is clear but very pressured. He responds to questions but offers circumstantial details and it is hard to interrupt him to move on.   During evaluation NKOSI CORTRIGHT is sitting up in his gurney writing and drawing, and appears to be in distress. He is alert, oriented to self and environment, anxious. His mood is labile with congruent affect. He has rapid, pressured speech, and behavior.  Objectively there is evidence of psychosis/mania or delusional thinking. He  denies suicidal/self-harm/homicidal ideation, psychosis, and paranoia. On initial examination, patient is labile, agitated and anxious.  Psych ROS:  Depression: Denies Anxiety:  Positive Mania (lifetime and current): Positive  Psychosis: (lifetime and current): Positive  Collateral information:  Attempted to call patient's mother, no answer we will follow up  Review of Systems  Psychiatric/Behavioral:  Positive for hallucinations.      Psychiatric and Social History  Psychiatric History:  Information collected from patient chart  Prev Dx/Sx: Bipolar disorder Current Psych Provider: None Home Meds (current): See above Previous Med Trials:  Unknown Therapy: None  Prior Psych Hospitalization: Yes Prior Self Harm: Yes Prior Violence: Yes  Family Psych History: Denies Family Hx suicide: Denies  Social History:  Developmental Hx: Deferred Educational Hx: Unknown Occupational Hx: Unemployed Legal Hx: Denies Living Situation: Lives at home with mother Spiritual Hx: Yes Access to weapons/lethal means: Denies   Substance History Alcohol: Past alcohol abuse  Tobacco: No Illicit drugs: THC Prescription drug abuse: Denies Rehab hx: Denies  Exam Findings  Physical Exam:  Vital Signs:  Temp:  [99.3 F (37.4 C)] 99.3 F (37.4 C) (03/13 1313) Pulse Rate:  [104] 104 (03/13 1300) Resp:  [17] 17 (03/13 1313) BP: (138)/(94) 138/94 (03/13 1313) SpO2:  [99 %] 99 % (03/13 1300) Weight:  [69 kg] 69 kg (03/13 1320) Blood pressure (!) 138/94, pulse (!) 104, temperature 99.3 F (37.4 C), temperature source Oral, resp. rate 17, height 5\' 11"  (1.803 m), weight 69 kg, SpO2 99%. Body mass index is 21.22 kg/m.  Physical Exam Vitals and nursing note reviewed. Exam conducted with a chaperone present.  Neurological:     Mental Status: He is alert.  Psychiatric:        Attention and Perception: He is inattentive. He perceives auditory hallucinations.        Mood and Affect: Mood is anxious.        Speech: Speech is rapid and pressured.        Behavior: Behavior is agitated.        Thought Content: Thought content is paranoid and delusional.        Cognition and Memory: Cognition is impaired.        Judgment: Judgment is inappropriate.     Mental Status Exam: General Appearance: Disheveled  Orientation:  Other:  name and environment   Memory:  Immediate;   Poor Remote;   Poor  Concentration:  Concentration: Poor and Attention Span: Poor  Recall:  Poor  Attention  Poor  Eye Contact:  Minimal  Speech:  Pressured and rapid  Language:  Poor  Volume:  Normal  Mood: anxious, labile   Affect:  Inappropriate and Labile   Thought Process:  Disorganized  Thought Content:  Illogical, Delusions, Obsessions, and Paranoid Ideation  Suicidal Thoughts:  No  Homicidal Thoughts:  No  Judgement:  Impaired  Insight:  Lacking  Psychomotor Activity:  Normal  Akathisia:  No  Fund of Knowledge:  Poor      Assets:  Communication Skills Desire for Improvement Housing Social Support  Cognition:  Impaired,  Severe  ADL's:  Impaired  AIMS (if indicated):        Other History   These have been pulled in through the EMR, reviewed, and updated if appropriate.  Family History:  The patient's family history is not on file.  Medical History: Past Medical History:  Diagnosis Date   Anxiety    Arthritis    Disorder of pineal gland    Post  traumatic stress disorder     Surgical History: Past Surgical History:  Procedure Laterality Date   ANKLE SURGERY       Medications:   Current Facility-Administered Medications:    divalproex (DEPAKOTE ER) 24 hr tablet 500 mg, 500 mg, Oral, BID, Kommor, Madison, MD   gabapentin (NEURONTIN) capsule 200 mg, 200 mg, Oral, TID, Bell, Michelle T, RPH, 200 mg at 06/26/23 1845   nicotine (NICODERM CQ - dosed in mg/24 hours) patch 21 mg, 21 mg, Transdermal, Once, Kommor, Madison, MD, 21 mg at 06/26/23 1442   nicotine polacrilex (NICORETTE) gum 2 mg, 2 mg, Oral, PRN, Kommor, Madison, MD   traZODone (DESYREL) tablet 50 mg, 50 mg, Oral, QHS, Lorre Nick, MD   ziprasidone (GEODON) injection 20 mg, 20 mg, Intramuscular, Once, Motley-Mangrum, Demonta Wombles A, PMHNP  Current Outpatient Medications:    gabapentin (NEURONTIN) 100 MG capsule, Take 2 capsules (200 mg total) by mouth 3 (three) times daily., Disp: 180 capsule, Rfl: 3   Melatonin 10 MG TABS, Take 10 mg by mouth at bedtime., Disp: 30 tablet, Rfl: 3   Multiple Vitamin (MULTIVITAMIN WITH MINERALS) TABS tablet, Take 1 tablet by mouth daily with breakfast., Disp: , Rfl:    nicotine (NICODERM CQ - DOSED IN MG/24 HOURS) 14 mg/24hr  patch, Place 1 patch (14 mg total) onto the skin daily., Disp: 28 patch, Rfl: 0   nicotine polacrilex (NICORETTE) 2 MG gum, Take 1 each (2 mg total) by mouth every 4 (four) hours as needed for smoking cessation (while awake)., Disp: 100 tablet, Rfl: 0   Nutritional Supplements (ENSURE ORIGINAL) LIQD, Take 1 Dose by mouth daily., Disp: , Rfl:    traZODone (DESYREL) 50 MG tablet, Take 1 tablet (50 mg total) by mouth at bedtime., Disp: 30 tablet, Rfl: 3   divalproex (DEPAKOTE ER) 500 MG 24 hr tablet, Take 1 tablet (500 mg total) by mouth 2 (two) times daily. (Patient not taking: Reported on 06/26/2023), Disp: 60 tablet, Rfl: 3   QUEtiapine (SEROQUEL) 50 MG tablet, Take 1 tablet (50 mg total) by mouth at bedtime. (Patient not taking: Reported on 06/26/2023), Disp: 7 tablet, Rfl: 0  Allergies: Allergies  Allergen Reactions   Hydroxyzine Itching, Anxiety and Other (See Comments)    "Hyper," too   Alupent [Metaproterenol] Other (See Comments)    Hyperactivity   Benadryl [Diphenhydramine] Other (See Comments)    Pt states that this makes him "feel insane"   Klonopin [Clonazepam] Other (See Comments)    Patient feels Ativan is a better fit for him than Klonopin- effect is not as strong.   Other Other (See Comments)    Pt reports that all antihistamines make him "feel insane"   Zyprexa [Olanzapine] Other (See Comments)    "This made me feel weird."    Alona Bene, PMHNP

## 2023-06-26 NOTE — ED Notes (Signed)
 Urine sent

## 2023-06-27 LAB — SARS CORONAVIRUS 2 BY RT PCR: SARS Coronavirus 2 by RT PCR: NEGATIVE

## 2023-06-27 MED ORDER — NICOTINE 21 MG/24HR TD PT24
21.0000 mg | MEDICATED_PATCH | Freq: Every day | TRANSDERMAL | Status: DC
  Administered 2023-06-27 – 2023-06-29 (×3): 21 mg via TRANSDERMAL
  Filled 2023-06-27 (×3): qty 1

## 2023-06-27 MED ORDER — QUETIAPINE FUMARATE 50 MG PO TABS
50.0000 mg | ORAL_TABLET | Freq: Every day | ORAL | Status: DC
  Filled 2023-06-27 (×2): qty 1

## 2023-06-27 NOTE — ED Notes (Signed)
 NT will obtain vitals when pt awakes due to behavior per RN request.

## 2023-06-27 NOTE — Progress Notes (Signed)
 LCSW Progress Note  [id number]   Ronald [name]  06/27/2023  11:57 AM  Description:   Inpatient Psychiatric Referral  Patient was recommended inpatient per Inocente Salles, NP. There are no available beds at [facility], per [facility], RN. Patient was referred to the following out of network facilities:   Destination  Service Provider Request Status Address Phone Fax  [facility] Pending - Request Sent [address] [name] [address] [phone]  [facility] Pending - Request Sent [address] [name] [address] [phone]  [facility] Pending - Request Sent [address] [name] [address] [phone]  [facility] Pending - Request Sent [address] [name] [address] [phone]  [facility] Pending - Request Sent [address] [name] [address] [phone]  [facility] Pending - Request Sent [address] [name] [phone]  [facility] Pending - Request Sent [address], [name] [address] [phone]  [facility] Pending - Request Sent [address] [name] [address] [phone]  [facility] Pending - Request Sent [address], [name] [address] [phone]  [facility] Pending - Request Sent [address] [name] [address] [phone]  [facility] Pending - Request Sent [address] [name] [address] [phone]  [facility] Pending - Request [facility], [name] [phone]  [facility] Pending - Request Sent [address]  [name] [address] [phone]     Situation ongoing, CSW to continue following and update chart as more information becomes available.     [place], [facility]

## 2023-06-27 NOTE — ED Notes (Signed)
 Pt came to the window making demands that were not within the rules. This medic and NT referred patient to the rule sheet on the window. Pt continued to be belligerent for a while. After calling this medic and NT "puntas" pt went back to his room.

## 2023-06-27 NOTE — ED Notes (Addendum)
 Patient having a visit with his mom

## 2023-06-27 NOTE — ED Notes (Signed)
 Patient has been alert this shift. Cooperative with staff. Patient refused depakote this AM. Patient has been sleeping.

## 2023-06-27 NOTE — ED Notes (Signed)
Patient has taken a shower. 

## 2023-06-27 NOTE — ED Provider Notes (Signed)
 Emergency Medicine Observation Re-evaluation Note  MICAJAH DENNIN is a 43 y.o. male, seen on rounds today.  Pt initially presented to the ED for complaints of Psychiatric Evaluation Currently, the patient is asleep.  Physical Exam  BP 124/88 (BP Location: Right Arm)   Pulse 65   Temp 97.6 F (36.4 C) (Oral)   Resp 16   Ht 5\' 11"  (1.803 m)   Wt 69 kg   SpO2 100%   BMI 21.22 kg/m  Physical Exam General: No acute distress Cardiac: Normal rate Lungs: Normal effort Psych: Unable to assess  ED Course / MDM  EKG:   I have reviewed the labs performed to date as well as medications administered while in observation.  Recent changes in the last 24 hours include ED evaluation, medically cleared, evaluated by psychiatry who are recommending inpatient hospitalization..  Plan  Current plan is for inpatient hospitalization.    Anders Simmonds T, DO 06/27/23 [phone]

## 2023-06-27 NOTE — ED Notes (Signed)
 Pt refused Depakote "I'm allergic to it" pt Then proceeds to state he needs to sign out. Pt is IVC'ed and that will not be possible at the moment. He then returned to room.

## 2023-06-27 NOTE — ED Notes (Signed)
 Pt states he is allergic to both seroquel and depakote. Pt stated that we need to stop giving him these medications. Pt became angry and stated that he wanted me to "take these out of his chart" Pt stated these medications make him hallucinate

## 2023-06-27 NOTE — Progress Notes (Signed)
 LCSW Progress Note   [id number]    Ronald [name]   06/27/2023   3:55 PM  @ 1553, the intake coordinator, Benna Dunks, from [facility] accepted the patient to their facility, pending negative COVID results. The bed is available on 06/28/2023, after 8 AM. The accepting provider is Dr. Lorelee Cover from [facility], and the nurse report can be reached at [phone]. Patient's care team provided disposition updates.

## 2023-06-28 DIAGNOSIS — F3112 Bipolar disorder, current episode manic without psychotic features, moderate: Secondary | ICD-10-CM | POA: Diagnosis not present

## 2023-06-28 MED ORDER — IBUPROFEN 800 MG PO TABS
800.0000 mg | ORAL_TABLET | Freq: Two times a day (BID) | ORAL | Status: DC | PRN
  Administered 2023-06-28: 800 mg via ORAL
  Filled 2023-06-28: qty 1

## 2023-06-28 MED ORDER — ACETAMINOPHEN 325 MG PO TABS: 650.0000 mg | ORAL_TABLET | ORAL | Status: DC | PRN

## 2023-06-28 NOTE — ED Notes (Signed)
 Sheriff called and said they will not be able to transport until tomorrow

## 2023-06-28 NOTE — ED Notes (Signed)
 Called and informed RN at [facility] that [facility] will not be able to transport patient until tomorrow

## 2023-06-28 NOTE — ED Notes (Signed)
 Ronald [name] in AM
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
## 2023-06-28 NOTE — ED Notes (Signed)
 Per RN at [facility] RN need to call back after 7 AM.

## 2023-06-28 NOTE — ED Notes (Addendum)
 Mother called and would like patient to give her a call before he is transported tomorrow. Told her I would pass it along to the dayshift nurse.

## 2023-06-28 NOTE — ED Notes (Signed)
 Patient given biscuit with drink for snack.

## 2023-06-28 NOTE — ED Notes (Signed)
 Called report to [employer] at [facility]

## 2023-06-29 MED ORDER — MELATONIN 5 MG PO TABS
5.0000 mg | ORAL_TABLET | Freq: Once | ORAL | Status: AC
  Administered 2023-06-29: 5 mg via ORAL
  Filled 2023-06-29: qty 1

## 2023-06-29 NOTE — ED Notes (Signed)
 Patient left with transportation to go to new facility
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
## 2023-06-29 NOTE — ED Notes (Signed)
 Pt came back to window and asked for water, water was given.

## 2023-06-29 NOTE — ED Notes (Signed)
 Patient awake and awaiting transport to new facility

## 2023-06-29 NOTE — ED Notes (Addendum)
 Patient is being rude to staff, Talking about how we are taking from life because we wont give him nourishment. This Clinical research associate and the tech informed him that snack time is not till after 8am. Patient got upset and started getting loud with this Clinical research associate. Saying I don't respect life and insulting against weight. Saying "looks like you have plenty of nourishment,you probably eat while you sleep don't you". Unable to deescalate patient and he walked off talking under his breath.  Patient kept asking for techs name. He stated under HIPAA she has to tell him her name.

## 2023-07-08 ENCOUNTER — Telehealth: Payer: Self-pay

## 2023-07-08 NOTE — Transitions of Care (Post Inpatient/ED Visit) (Unsigned)
   07/08/2023  Name: Ronald [name] MRN: [id number] DOB: Sep 09, 1980  Today's TOC FU Call Status: Today's TOC FU Call Status:: Unsuccessful Call (1st Attempt) Unsuccessful Call (1st Attempt) Date: 07/08/23  Attempted to reach the patient regarding the most recent Inpatient/ED visit.  Follow Up Plan: Additional outreach attempts will be made to reach the patient to complete the Transitions of Care (Post Inpatient/ED visit) call.   Signature Karena Addison, LPN [facility] Nurse Health Advisor Direct Dial [phone]

## 2023-07-09 NOTE — Transitions of Care (Post Inpatient/ED Visit) (Signed)
   07/09/2023  Name: Ronald [name] MRN: [id number] DOB: March 08, 1981  Today's TOC FU Call Status: Today's TOC FU Call Status:: Unsuccessful Call (3rd Attempt) Unsuccessful Call (1st Attempt) Date: 07/08/23 Unsuccessful Call (2nd Attempt) Date: 07/09/23 Unsuccessful Call (3rd Attempt) Date: 07/09/23  Attempted to reach the patient regarding the most recent Inpatient/ED visit.  Follow Up Plan: No further outreach attempts will be made at this time. We have been unable to contact the patient.  Signature Karena Addison, LPN [facility] Nurse Health Advisor Direct Dial [phone]

## 2023-07-09 NOTE — Transitions of Care (Post Inpatient/ED Visit) (Signed)
   07/09/2023  Name: Ronald [name] MRN: [id number] DOB: 1980-10-25  Today's TOC FU Call Status: Today's TOC FU Call Status:: Unsuccessful Call (2nd Attempt) Unsuccessful Call (1st Attempt) Date: 07/08/23 Unsuccessful Call (2nd Attempt) Date: 07/09/23  Attempted to reach the patient regarding the most recent Inpatient/ED visit.  Follow Up Plan: Additional outreach attempts will be made to reach the patient to complete the Transitions of Care (Post Inpatient/ED visit) call.   Signature Karena Addison, LPN [facility] Nurse Health Advisor Direct Dial [phone]

## 2023-07-22 ENCOUNTER — Encounter (HOSPITAL_COMMUNITY): Payer: Self-pay

## 2023-07-23 ENCOUNTER — Ambulatory Visit (HOSPITAL_COMMUNITY): Payer: No Typology Code available for payment source | Admitting: Psychiatry

## 2023-08-11 ENCOUNTER — Ambulatory Visit (HOSPITAL_COMMUNITY): Payer: No Typology Code available for payment source | Admitting: Mental Health

## 2023-08-14 ENCOUNTER — Inpatient Hospital Stay: Payer: Self-pay | Admitting: Internal Medicine

## 2023-08-14 ENCOUNTER — Ambulatory Visit: Payer: Self-pay | Admitting: Internal Medicine

## 2023-08-14 ENCOUNTER — Emergency Department (HOSPITAL_COMMUNITY)
Admission: EM | Admit: 2023-08-14 | Discharge: 2023-08-16 | Disposition: A | Discharge status: Other Acute Inpatient Hospital | Payer: Self-pay | Attending: Emergency Medicine | Admitting: Emergency Medicine

## 2023-08-14 DIAGNOSIS — F1290 Cannabis use, unspecified, uncomplicated: Secondary | ICD-10-CM | POA: Diagnosis present

## 2023-08-14 DIAGNOSIS — F411 Generalized anxiety disorder: Secondary | ICD-10-CM | POA: Diagnosis present

## 2023-08-14 DIAGNOSIS — F419 Anxiety disorder, unspecified: Secondary | ICD-10-CM | POA: Insufficient documentation

## 2023-08-14 DIAGNOSIS — F319 Bipolar disorder, unspecified: Secondary | ICD-10-CM | POA: Insufficient documentation

## 2023-08-14 DIAGNOSIS — R4689 Other symptoms and signs involving appearance and behavior: Secondary | ICD-10-CM

## 2023-08-14 DIAGNOSIS — E876 Hypokalemia: Secondary | ICD-10-CM | POA: Insufficient documentation

## 2023-08-14 DIAGNOSIS — F3112 Bipolar disorder, current episode manic without psychotic features, moderate: Secondary | ICD-10-CM | POA: Diagnosis present

## 2023-08-14 MED ORDER — STERILE WATER FOR INJECTION IJ SOLN
INTRAMUSCULAR | Status: AC
  Filled 2023-08-14: qty 10

## 2023-08-14 MED ORDER — ZIPRASIDONE MESYLATE 20 MG IM SOLR
20.0000 mg | Freq: Once | INTRAMUSCULAR | Status: AC
  Administered 2023-08-14: 20 mg via INTRAMUSCULAR
  Filled 2023-08-14: qty 20

## 2023-08-15 ENCOUNTER — Other Ambulatory Visit: Payer: Self-pay

## 2023-08-15 ENCOUNTER — Encounter (HOSPITAL_COMMUNITY): Payer: Self-pay

## 2023-08-15 ENCOUNTER — Telehealth: Payer: Self-pay | Admitting: Internal Medicine

## 2023-08-15 DIAGNOSIS — F3112 Bipolar disorder, current episode manic without psychotic features, moderate: Secondary | ICD-10-CM

## 2023-08-15 LAB — COMPREHENSIVE METABOLIC PANEL WITH GFR
ALT: 45 U/L — ABNORMAL HIGH (ref 0–44)
AST: 80 U/L — ABNORMAL HIGH (ref 15–41)
Albumin: 4 g/dL (ref 3.5–5.0)
Alkaline Phosphatase: 44 U/L (ref 38–126)
Anion gap: 11 (ref 5–15)
BUN: 10 mg/dL (ref 6–20)
CO2: 22 mmol/L (ref 22–32)
Calcium: 8.5 mg/dL — ABNORMAL LOW (ref 8.9–10.3)
Chloride: 99 mmol/L (ref 98–111)
Creatinine, Ser: 0.98 mg/dL (ref 0.61–1.24)
GFR, Estimated: >60 mL/min (ref >60)
Glucose, Bld: 114 mg/dL — ABNORMAL HIGH (ref 70–99)
Potassium: 2.8 mmol/L — ABNORMAL LOW (ref 3.5–5.1)
Sodium: 132 mmol/L — ABNORMAL LOW (ref 135–145)
Total Bilirubin: 1.1 mg/dL (ref 0.0–1.2)
Total Protein: 6.2 g/dL — ABNORMAL LOW (ref 6.5–8.1)

## 2023-08-15 LAB — ETHANOL: Alcohol, Ethyl (B): <15 mg/dL (ref <15)

## 2023-08-15 LAB — BASIC METABOLIC PANEL WITH GFR
Anion gap: 7 (ref 5–15)
BUN: 9 mg/dL (ref 6–20)
CO2: 27 mmol/L (ref 22–32)
Calcium: 9.3 mg/dL (ref 8.9–10.3)
Chloride: 104 mmol/L (ref 98–111)
Creatinine, Ser: 1.18 mg/dL (ref 0.61–1.24)
GFR, Estimated: >60 mL/min (ref >60)
Glucose, Bld: 83 mg/dL (ref 70–99)
Potassium: 4.1 mmol/L (ref 3.5–5.1)
Sodium: 138 mmol/L (ref 135–145)

## 2023-08-15 LAB — CBC WITH DIFFERENTIAL/PLATELET
Abs Immature Granulocytes: 0.01 10*3/uL (ref 0.00–0.07)
Basophils Absolute: 0 10*3/uL (ref 0.0–0.1)
Basophils Relative: 1 %
Eosinophils Absolute: 0 10*3/uL (ref 0.0–0.5)
Eosinophils Relative: 0 %
HCT: 37.2 % — ABNORMAL LOW (ref 39.0–52.0)
Hemoglobin: 13.1 g/dL (ref 13.0–17.0)
Immature Granulocytes: 0 %
Lymphocytes Relative: 26 %
Lymphs Abs: 1.9 10*3/uL (ref 0.7–4.0)
MCH: 30.3 pg (ref 26.0–34.0)
MCHC: 35.2 g/dL (ref 30.0–36.0)
MCV: 85.9 fL (ref 80.0–100.0)
Monocytes Absolute: 0.7 10*3/uL (ref 0.1–1.0)
Monocytes Relative: 9 %
Neutro Abs: 4.6 10*3/uL (ref 1.7–7.7)
Neutrophils Relative %: 64 %
Platelets: 218 10*3/uL (ref 150–400)
RBC: 4.33 MIL/uL (ref 4.22–5.81)
RDW: 12.2 % (ref 11.5–15.5)
WBC: 7.2 10*3/uL (ref 4.0–10.5)
nRBC: 0 % (ref 0.0–0.2)

## 2023-08-15 LAB — SALICYLATE LEVEL: Salicylate Lvl: <7 mg/dL — ABNORMAL LOW (ref 7.0–30.0)

## 2023-08-15 LAB — CBG MONITORING, ED: Glucose-Capillary: 112 mg/dL — ABNORMAL HIGH (ref 70–99)

## 2023-08-15 LAB — ACETAMINOPHEN LEVEL: Acetaminophen (Tylenol), Serum: <10 ug/mL — ABNORMAL LOW (ref 10–30)

## 2023-08-15 MED ORDER — POTASSIUM CHLORIDE CRYS ER 20 MEQ PO TBCR
60.0000 meq | EXTENDED_RELEASE_TABLET | Freq: Once | ORAL | Status: AC
  Administered 2023-08-15: 60 meq via ORAL
  Filled 2023-08-15: qty 3

## 2023-08-15 MED ORDER — TRAZODONE HCL 50 MG PO TABS
50.0000 mg | ORAL_TABLET | Freq: Every day | ORAL | Status: DC
  Administered 2023-08-15: 50 mg via ORAL
  Filled 2023-08-15: qty 1

## 2023-08-15 MED ORDER — POTASSIUM CHLORIDE CRYS ER 20 MEQ PO TBCR
20.0000 meq | EXTENDED_RELEASE_TABLET | Freq: Once | ORAL | Status: AC
  Administered 2023-08-15: 20 meq via ORAL
  Filled 2023-08-15: qty 1

## 2023-08-15 MED ORDER — DIVALPROEX SODIUM ER 500 MG PO TB24: 500.0000 mg | ORAL_TABLET | Freq: Two times a day (BID) | ORAL | Status: DC

## 2023-08-15 MED ORDER — ACETAMINOPHEN 325 MG PO TABS
650.0000 mg | ORAL_TABLET | ORAL | Status: DC | PRN
  Filled 2023-08-15: qty 2

## 2023-08-15 MED ORDER — ALUM & MAG HYDROXIDE-SIMETH 200-200-20 MG/5ML PO SUSP: 30.0000 mL | Freq: Four times a day (QID) | ORAL | Status: DC | PRN

## 2023-08-15 MED ORDER — GABAPENTIN 100 MG PO CAPS
200.0000 mg | ORAL_CAPSULE | Freq: Three times a day (TID) | ORAL | Status: DC
  Administered 2023-08-15 – 2023-08-16 (×4): 200 mg via ORAL
  Filled 2023-08-15 (×5): qty 2

## 2023-08-15 MED ORDER — NICOTINE 21 MG/24HR TD PT24
21.0000 mg | MEDICATED_PATCH | Freq: Every day | TRANSDERMAL | Status: DC
  Administered 2023-08-15 – 2023-08-16 (×2): 21 mg via TRANSDERMAL
  Filled 2023-08-15 (×3): qty 1

## 2023-08-15 MED ORDER — DIVALPROEX SODIUM ER 500 MG PO TB24
500.0000 mg | ORAL_TABLET | Freq: Two times a day (BID) | ORAL | Status: DC
  Administered 2023-08-15 – 2023-08-16 (×3): 500 mg via ORAL
  Filled 2023-08-15 (×4): qty 1

## 2023-08-15 MED ORDER — ONDANSETRON HCL 4 MG PO TABS: 4.0000 mg | ORAL_TABLET | Freq: Three times a day (TID) | ORAL | Status: DC | PRN

## 2023-08-15 MED ORDER — NICOTINE POLACRILEX 2 MG MT GUM
2.0000 mg | CHEWING_GUM | OROMUCOSAL | Status: DC | PRN
  Administered 2023-08-15 – 2023-08-16 (×3): 2 mg via ORAL
  Filled 2023-08-15 (×4): qty 1
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
## 2023-08-15 NOTE — ED Notes (Signed)
 This Clinical research associate explained to pt that his room door must be kept open at all times. Pt then explained to this writer that his mom needs to be aware of his whereabouts because they were both taken to the hospital at the same time. Pt given a writing utensil and piece of paper to write a note per pt request. Pt asked for the note to be given to the director of the hospital this writer proceeded to give note to Fabio Holts, Charity fundraiser. Pt calm and well spoken at this time. Pt now laying in bed. Pt asked this Clinical research associate to turn on TV this Clinical research associate did so. Pt given 3 warm blankets. Pt now resting on stretcher with eyes closed.
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
## 2023-08-15 NOTE — ED Notes (Signed)
 Patient overall very pleasant but repetitive and ask several questions of the same nature repeatedly. No aggressive behavior noted this shift.

## 2023-08-15 NOTE — ED Notes (Signed)
Pending placement

## 2023-08-15 NOTE — Progress Notes (Signed)
 LCSW Progress Note  [id number]   Ronald [name]  08/15/2023  10:54 AM  Description:   Inpatient Psychiatric Referral  Patient was recommended inpatient per Jadeka Motley-Mangrum, PMHNP ). There are no available beds at [facility], per [facility] RN. Patient was referred to the following out of network facilities:   Destination  Service Provider Address Phone Fax  [facility] [address], [name] [address] [phone]  [facility] [address] [place], [name] [address] [phone]  [facility] [address] [name] [address] [phone]  [facility] [address], [name] [address] [phone]  [facility] [address], [name] [address] [phone]  [facility] [address] [name] [address] [phone]  [facility] EFAX [address] [place], [name] [phone]  [facility] [address] [name] [address] [phone]  [facility] [address], [name] [address] [phone]      Situation ongoing, CSW to continue following and update chart as more information becomes available.     [place] Abid Bolla, MSW, LCSW  08/15/2023 10:54 AM
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
## 2023-08-15 NOTE — ED Triage Notes (Signed)
 Pt's mother called [employer] d/t patient being manic and sent him to ER under IVC. Hx of bipolar and schizophrenia and PTSD. Minimal sleep reported by mother. Pt recently committed in March  for similar behavior. Hx of hallucinations "seeing aliens". Pt came to ER in handcuffs, shouting and attempting to run. Denies SI/HI.
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
## 2023-08-15 NOTE — BH Assessment (Signed)
 At [phone], clinician spoke to [place], RN and noted the pt is currently sleeping and is unable to engage in the TTS assessment as this time. Clinician asked RN to let her know if the pt wakes and she will completed his assessment. Clinician also sent RN a secure message.     Rosi Converse, MS, [facility], [facility] Triage Specialist [phone]

## 2023-08-15 NOTE — ED Notes (Signed)
 [employer] removed cuffs from bilateral ankles and wrists. [employer] leaving at this time. Restraints attached to bed but not to patient.

## 2023-08-15 NOTE — ED Notes (Signed)
 Pt moved to room 35. Report given to [facility] RN. Belongings bag placed in locker # 35
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
## 2023-08-15 NOTE — Telephone Encounter (Signed)
 Copied from CRM [id number]. Topic: General - Other >> Aug 15, 2023  3:18 PM [place] J wrote: Reason for CRM: Patient's mother [name]) is calling on behalf of the patient to let Dr. Rochelle Chu know that he was admitted to a phychiatric unit at [facility].  For any questions, please call patient's mother: [phone]

## 2023-08-15 NOTE — ED Notes (Signed)
 Pt getting agitated about meds states "I need a doctor ,  need my meds right now"  " I can't wait any longer"

## 2023-08-16 ENCOUNTER — Encounter (HOSPITAL_COMMUNITY): Payer: Self-pay | Admitting: Adult Health

## 2023-08-16 ENCOUNTER — Inpatient Hospital Stay (HOSPITAL_COMMUNITY)
Admission: AD | Admit: 2023-08-16 | Discharge: 2023-09-02 | DRG: 885 | Disposition: A | Discharge status: Home / Self Care | Source: Intra-hospital | Attending: Psychiatry | Admitting: Psychiatry

## 2023-08-16 ENCOUNTER — Other Ambulatory Visit: Payer: Self-pay

## 2023-08-16 DIAGNOSIS — F250 Schizoaffective disorder, bipolar type: Secondary | ICD-10-CM | POA: Diagnosis present

## 2023-08-16 DIAGNOSIS — Z888 Allergy status to other drugs, medicaments and biological substances status: Secondary | ICD-10-CM | POA: Diagnosis not present

## 2023-08-16 DIAGNOSIS — Z91148 Patient's other noncompliance with medication regimen for other reason: Secondary | ICD-10-CM | POA: Diagnosis not present

## 2023-08-16 DIAGNOSIS — F17200 Nicotine dependence, unspecified, uncomplicated: Secondary | ICD-10-CM | POA: Diagnosis present

## 2023-08-16 DIAGNOSIS — F1994 Other psychoactive substance use, unspecified with psychoactive substance-induced mood disorder: Secondary | ICD-10-CM | POA: Diagnosis present

## 2023-08-16 DIAGNOSIS — Z560 Unemployment, unspecified: Secondary | ICD-10-CM

## 2023-08-16 DIAGNOSIS — Z79899 Other long term (current) drug therapy: Secondary | ICD-10-CM | POA: Diagnosis not present

## 2023-08-16 DIAGNOSIS — Z634 Disappearance and death of family member: Secondary | ICD-10-CM

## 2023-08-16 DIAGNOSIS — F319 Bipolar disorder, unspecified: Principal | ICD-10-CM | POA: Insufficient documentation

## 2023-08-16 DIAGNOSIS — F259 Schizoaffective disorder, unspecified: Principal | ICD-10-CM | POA: Diagnosis present

## 2023-08-16 DIAGNOSIS — R45851 Suicidal ideations: Secondary | ICD-10-CM | POA: Diagnosis present

## 2023-08-16 DIAGNOSIS — F22 Delusional disorders: Secondary | ICD-10-CM | POA: Diagnosis present

## 2023-08-16 DIAGNOSIS — F1210 Cannabis abuse, uncomplicated: Secondary | ICD-10-CM | POA: Diagnosis present

## 2023-08-16 DIAGNOSIS — F410 Panic disorder [episodic paroxysmal anxiety] without agoraphobia: Secondary | ICD-10-CM | POA: Diagnosis present

## 2023-08-16 LAB — RAPID URINE DRUG SCREEN, HOSP PERFORMED
Amphetamines: NOT DETECTED
Barbiturates: NOT DETECTED
Benzodiazepines: NOT DETECTED
Cocaine: NOT DETECTED
Opiates: NOT DETECTED
Tetrahydrocannabinol: POSITIVE — AB

## 2023-08-16 MED ORDER — ONDANSETRON HCL 4 MG PO TABS: 4.0000 mg | ORAL_TABLET | Freq: Three times a day (TID) | ORAL | Status: DC | PRN

## 2023-08-16 MED ORDER — ALUM & MAG HYDROXIDE-SIMETH 200-200-20 MG/5ML PO SUSP: 30.0000 mL | Freq: Four times a day (QID) | ORAL | Status: DC | PRN

## 2023-08-16 MED ORDER — NICOTINE POLACRILEX 2 MG MT GUM
2.0000 mg | CHEWING_GUM | OROMUCOSAL | Status: DC | PRN
  Administered 2023-08-16 – 2023-09-02 (×72): 2 mg via ORAL
  Filled 2023-08-16 (×47): qty 1

## 2023-08-16 MED ORDER — ACETAMINOPHEN 325 MG PO TABS
650.0000 mg | ORAL_TABLET | ORAL | Status: DC | PRN
  Administered 2023-08-19 – 2023-09-01 (×11): 650 mg via ORAL
  Filled 2023-08-16 (×11): qty 2

## 2023-08-16 MED ORDER — LORAZEPAM 2 MG/ML IJ SOLN
2.0000 mg | Freq: Three times a day (TID) | INTRAMUSCULAR | Status: DC | PRN
Start: 2023-08-16 — End: 2023-09-02
  Administered 2023-08-21: 2 mg via INTRAMUSCULAR
  Filled 2023-08-16: qty 1

## 2023-08-16 MED ORDER — LORAZEPAM 1 MG PO TABS
1.0000 mg | ORAL_TABLET | Freq: Three times a day (TID) | ORAL | Status: DC | PRN
  Administered 2023-08-16 – 2023-08-18 (×3): 1 mg via ORAL
  Filled 2023-08-16 (×3): qty 1

## 2023-08-16 MED ORDER — HALOPERIDOL LACTATE 5 MG/ML IJ SOLN: 10.0000 mg | Freq: Three times a day (TID) | INTRAMUSCULAR | Status: DC | PRN

## 2023-08-16 MED ORDER — GABAPENTIN 100 MG PO CAPS
200.0000 mg | ORAL_CAPSULE | Freq: Three times a day (TID) | ORAL | Status: DC
  Administered 2023-08-16 – 2023-09-02 (×52): 200 mg via ORAL
  Filled 2023-08-16 (×49): qty 2
  Filled 2023-08-16: qty 42
  Filled 2023-08-16 (×10): qty 2

## 2023-08-16 MED ORDER — HALOPERIDOL 5 MG PO TABS
5.0000 mg | ORAL_TABLET | Freq: Three times a day (TID) | ORAL | Status: DC | PRN
  Administered 2023-08-18 – 2023-08-20 (×2): 5 mg via ORAL
  Filled 2023-08-16 (×2): qty 1

## 2023-08-16 MED ORDER — DIVALPROEX SODIUM ER 500 MG PO TB24
500.0000 mg | ORAL_TABLET | Freq: Two times a day (BID) | ORAL | Status: DC
  Administered 2023-08-16 – 2023-08-17 (×2): 500 mg via ORAL
  Filled 2023-08-16 (×7): qty 1

## 2023-08-16 MED ORDER — MAGNESIUM HYDROXIDE 400 MG/5ML PO SUSP: 30.0000 mL | Freq: Every day | ORAL | Status: DC | PRN

## 2023-08-16 MED ORDER — MAGNESIUM OXIDE -MG SUPPLEMENT 400 (240 MG) MG PO TABS
400.0000 mg | ORAL_TABLET | Freq: Once | ORAL | Status: AC
Start: 2023-08-16 — End: 2023-08-16
  Administered 2023-08-16: 400 mg via ORAL
  Filled 2023-08-16: qty 1

## 2023-08-16 MED ORDER — TRAZODONE HCL 50 MG PO TABS
50.0000 mg | ORAL_TABLET | Freq: Every day | ORAL | Status: DC
  Administered 2023-08-16: 50 mg via ORAL
  Filled 2023-08-16 (×4): qty 1

## 2023-08-16 MED ORDER — LORAZEPAM 1 MG PO TABS
1.0000 mg | ORAL_TABLET | Freq: Three times a day (TID) | ORAL | Status: DC | PRN
Start: 2023-08-16 — End: 2023-08-16
  Administered 2023-08-16: 1 mg via ORAL
  Filled 2023-08-16: qty 1

## 2023-08-16 MED ORDER — MAGNESIUM OXIDE 400 MG PO TABS
400.0000 mg | ORAL_TABLET | Freq: Every day | ORAL | Status: DC
Start: 2023-08-16 — End: 2023-09-02
  Administered 2023-08-16 – 2023-09-01 (×17): 400 mg via ORAL
  Filled 2023-08-16 (×23): qty 1

## 2023-08-16 MED ORDER — HALOPERIDOL LACTATE 5 MG/ML IJ SOLN
5.0000 mg | Freq: Three times a day (TID) | INTRAMUSCULAR | Status: DC | PRN
Start: 2023-08-16 — End: 2023-09-02
  Administered 2023-08-21: 5 mg via INTRAMUSCULAR
  Filled 2023-08-16: qty 1

## 2023-08-16 NOTE — ED Notes (Signed)
 Patient is now stating he is having a lot of anxiety

## 2023-08-16 NOTE — Group Note (Signed)
 Date:  08/16/2023 Time:  8:27 PM  Group Topic/Focus:  Wrap-Up Group:   The focus of this group is to help patients review their daily goal of treatment and discuss progress on daily workbooks.    Participation Level:  Active  Participation Quality:  Appropriate  Affect:  Appropriate  Cognitive:  Appropriate  Insight: Appropriate  Engagement in Group:  Engaged  Modes of Intervention:  Education and Exploration  Additional Comments:  Patient attended and participated in group tonight. He reports that today he eat, chill and watched television  Iretha Kirley Dacosta 08/16/2023, 8:27 PM

## 2023-08-16 NOTE — ED Notes (Signed)
Called report to [employer] at [facility]

## 2023-08-16 NOTE — Progress Notes (Signed)
 Patient has been denied by [facility] due to no appropriate beds available. Patient meets BH inpatient criteria per Chandra Come, PMHNP. Patient has been faxed out to the following facilities:    [facility] [address] [name] [address] [phone]  [facility] [address] [place], [name] [address] [phone]  [facility] [address] [name] [address] [phone]  [facility] [address] [name] [address] [phone]  [facility] [address] [name] [address] [phone]  [facility] [address], [name] [address] [phone]  [facility] EFAX [address] [name] [phone]  [facility] [address] [place], [name] [address] [phone]  [facility] [address] [name] [address] [phone]  [facility] [address], [name] [address] [phone]  [facility] [address] [place], [name] [address] [phone]  [facility] Patient Placement [facility], [name] [phone]  [facility] [address] [name] [address] [phone]  [facility] [address] [place], [name] [address] [phone]  [facility] [address] [name] [address] [phone]  [facility] [address] [name] [address] [phone]  [facility] [address] [name] [address] [phone]  [facility] 1  [facility] [name] [address] [phone]   Phares Brasher, MSW, LCSW-A  11:08 AM 08/16/2023

## 2023-08-16 NOTE — ED Notes (Signed)
Called [employer] to transport patient to [facility]. 
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
## 2023-08-16 NOTE — Plan of Care (Signed)

## 2023-08-16 NOTE — Progress Notes (Signed)
 Admission Note: Patient is a 43 years old male admitted to the unit under IVC for manic behaviors, poor sleep and anxiety.  Patient is alert and oriented x 4.  Patient presents with anxious affect and mood.  Stated he is here due to his panic attack.  Admission plan of care reviewed, consent signed.  Skin and personal belongings completed.  Abrasion noted on right wrist, no contraband found.  Routine safety check initiated.  Patient oriented to the unit, staff and room.  Verbalizes understanding of unit rules/protocols.  Patient is safe on the unit.

## 2023-08-16 NOTE — ED Provider Notes (Signed)
 Emergency Medicine Observation Re-evaluation Note  Ronald [name] is a 43 y.o. male, seen on rounds today.  Pt initially presented to the ED for complaints of Aggressive Behavior and Manic Behavior Currently, the patient is ambulatory.  No acute distress.  Physical Exam  BP 136/87 (BP Location: Right Arm)   Pulse 80   Temp 97.6 F (36.4 C)   Resp 20   SpO2 100%  Physical Exam   ED Course / MDM  EKG:EKG Interpretation Date/Time:  Friday Aug 15 2023 00:24:28 EDT Ventricular Rate:  75 PR Interval:  169 QRS Duration:  78 QT Interval:  391 QTC Calculation: 437 R Axis:   72  Text Interpretation: Sinus rhythm RSR' in V1 or V2, probably normal variant Confirmed by Maralee Senate, April ([id number]) on 08/15/2023 12:47:47 AM  I have reviewed the labs performed to date as well as medications administered while in observation.  Recent changes in the last 24 hours include awaiting placement.  Plan  Current plan is for awaiting placement.    Lind Repine, MD 08/16/23 [phone]

## 2023-08-16 NOTE — [facility] Group Notes (Signed)
 Adult Psychoeducational Group Note  Date:  08/16/2023 Time:  1:19 PM  Group Topic/Focus:  Goals Group:   The focus of this group is to help patients establish daily goals to achieve during treatment and discuss how the patient can incorporate goal setting into their daily lives to aide in recovery.  Participation Level:  Active  Participation Quality:  Appropriate  Affect:  Appropriate  Cognitive:  Appropriate  Insight: Appropriate  Engagement in Group:  Engaged  Modes of Intervention:  Discussion  Additional Comments:  The patient attended group  Ronald [name] 08/16/2023, 1:19 PM

## 2023-08-16 NOTE — Tx Team (Signed)
 Initial Treatment Plan 08/16/2023 1:28 PM ARDARIUS HAASCH [id number]    PATIENT STRESSORS: Financial difficulties   Medication change or noncompliance     PATIENT STRENGTHS: Ability for insight  Communication skills  Motivation for treatment/growth  Supportive family/friends    PATIENT IDENTIFIED PROBLEMS: Panic attack  Anxiety  restlessness  Ineffective coping skills  Insomnia             DISCHARGE CRITERIA:  Ability to meet basic life and health needs Adequate post-discharge living arrangements Motivation to continue treatment in a less acute level of care  PRELIMINARY DISCHARGE PLAN: Attend aftercare/continuing care group Outpatient therapy Return to previous living arrangement  PATIENT/FAMILY INVOLVEMENT: This treatment plan has been presented to and reviewed with the patient, SUDAIS WIDMEYER, and/or family member.  The patient and family have been given the opportunity to ask questions and make suggestions.  True Fuss Breonia Kirstein, RN 08/16/2023, 1:28 PM

## 2023-08-17 LAB — VALPROIC ACID LEVEL: Valproic Acid Lvl: 39 ug/mL — ABNORMAL LOW (ref 50–100)

## 2023-08-17 LAB — FOLATE: Folate: 14.9 ng/mL (ref >5.9)

## 2023-08-17 LAB — VITAMIN D 25 HYDROXY (VIT D DEFICIENCY, FRACTURES): Vit D, 25-Hydroxy: 97.2 ng/mL (ref 30–100)

## 2023-08-17 LAB — VITAMIN B12: Vitamin B-12: 806 pg/mL (ref 180–914)

## 2023-08-17 MED ORDER — ADULT MULTIVITAMIN W/MINERALS CH
1.0000 | ORAL_TABLET | Freq: Every day | ORAL | Status: DC
  Administered 2023-08-17 – 2023-09-02 (×17): 1 via ORAL
  Filled 2023-08-17 (×19): qty 1

## 2023-08-17 MED ORDER — TRAZODONE HCL 100 MG PO TABS
100.0000 mg | ORAL_TABLET | Freq: Every day | ORAL | Status: DC
  Administered 2023-08-17 – 2023-09-01 (×16): 100 mg via ORAL
  Filled 2023-08-17 (×3): qty 1
  Filled 2023-08-17: qty 7
  Filled 2023-08-17 (×14): qty 1

## 2023-08-17 MED ORDER — DIVALPROEX SODIUM ER 250 MG PO TB24
750.0000 mg | ORAL_TABLET | Freq: Two times a day (BID) | ORAL | Status: AC
Start: 2023-08-17 — End: 2023-08-22
  Administered 2023-08-17 – 2023-08-22 (×11): 750 mg via ORAL
  Filled 2023-08-17 (×16): qty 3

## 2023-08-17 MED ORDER — NICOTINE 14 MG/24HR TD PT24
14.0000 mg | MEDICATED_PATCH | Freq: Every day | TRANSDERMAL | Status: DC
  Administered 2023-08-17 – 2023-09-02 (×17): 14 mg via TRANSDERMAL
  Filled 2023-08-17 (×18): qty 1

## 2023-08-17 NOTE — Plan of Care (Signed)
  Problem: Education: Goal: Knowledge of [place] [facility] Education information/materials will improve Outcome: Progressing Goal: Mental status will improve Outcome: Progressing Goal: Verbalization of understanding the information provided will improve Outcome: Progressing   Problem: Education: Goal: Emotional status will improve Outcome: Not Progressing; pt presents as irritable at times

## 2023-08-17 NOTE — Progress Notes (Signed)
   08/16/23 2040  Psych Admission Type (Psych Patients Only)  Admission Status Involuntary  Psychosocial Assessment  Patient Complaints Anxiety (Pt stated, "my mom had to go in the hospital and I needed help for anxiety, that's why I'm here.")  Eye Contact Intense;Fair;Watchful  Facial Expression Pensive  Affect Constricted  Speech Logical/coherent;Other (Comment) (polite)  Interaction Assertive  Appearance/Hygiene In scrubs;Unremarkable  Behavior Characteristics Other (Comment) (Particular in his patterns)  Mood Preoccupied;Pleasant;Other (Comment) (Stressed when working with med regimen process but able to redirect self and be patient until resolved with provider)  Thought Process  Coherency WDL  Content Preoccupation;Blaming others  Delusions Persecutory  Perception WDL  Hallucination None reported or observed  Judgment Limited  Confusion None  Danger to Self  Current suicidal ideation? Denies  Self-Injurious Behavior No self-injurious ideation or behavior indicators observed or expressed   Agreement Not to Harm Self Yes  Description of Agreement Verbal  Danger to Others  Danger to Others None reported or observed   About 2330: Pt awoke and appeared irritable, with loud voice tones and pressured speech angry with staff about a snack. He appeared to be shaking and this writer attempted to redirect and offer emotional support. Pt stated, "I'm so hungry right now." Assured pt that an exception could be made and snacks were provided.  PRN Ativan  given.  Reminded pt of previous conversation that his plan to skip meals would not be in his best interest.  He verbalized understanding and pt redirected self back to bed without further issue.    08/16/23 2330  Psychosocial Assessment  Patient Complaints Appetite increase;Shakiness  Eye Contact Glaring  Facial Expression Anxious  Affect Anxious  Speech Pressured;Loud  Interaction Demanding  Motor Activity Other  (Comment) (clenched and tense)  Mood Anxious;Irritable  Thought Process  Coherency WDL  Content Blaming others  Delusions Persecutory  Hallucination None reported or observed  Judgment Poor
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
## 2023-08-17 NOTE — BHH Suicide Risk Assessment (Signed)
 Suicide Risk Assessment  Admission Assessment    [facility] Admission Suicide Risk Assessment   Nursing information obtained from:  Patient Demographic factors:  Male Current Mental Status:  NA Loss Factors:  NA Historical Factors:  NA Risk Reduction Factors:  Positive social support, Living with another person, especially a relative  Total Time spent with patient: 1.5 hours Principal Problem: Bipolar 1 disorder (HCC) Diagnosis:  Principal Problem:   Bipolar 1 disorder (HCC)   Subjective Data:   CC: "I need the tiniest dose of Ativan  you can give me"   Ronald [name] is a 43 y.o. male  with a past psychiatric history of medication noncompliance, GAD, PTSD, schizoaffective d/o bipolar type, cannabis use disorder, benzodiazepine abuse, with prior psychiatric hospitalizations x5 (last 05/18/2023 at [facility] for worsening aggressive behaviors and paranoia). Patient initially arrived to [facility] on 08/14/2023 for aggressive and manic behaviors, subsequently IVC'd and admitted to [facility] under IVC status on 08/16/2023 for psychiatric stabilization. UDS on admission is positive for THC. PMHx is significant for traumatic subdural hematoma and multiple concussions.    HPI:  Patient was evaluated today and appeared labile throughout the interview, alternating between periods of weeping and abrupt return to baseline speech without clear transitions. He reports a longstanding history of anxiety and mood symptoms, with worsening over the past month in the context of psychosocial stressors. He endorsed poor sleep (averaging 5 hours/night) and elevated anxiety related to his mother's recent cancer diagnosis, expressing fear that she may die. During the interview, he tearfully recalled that she has lost her hair and intermittently wept while discussing her illness. He also became tearful when recalling his brother's death in 08/29/2010, stating he was murdered. The emotional intensity fluctuated rapidly throughout the interview, and his  narrative was disorganized at times. He referenced a male patient from an "insane asylum" who had visited in the past and described a recent phone interaction where she "sounded irate." He stated he told his mother about this concern and believes she called 911 out of worry, although he expressed some confusion about the sequence of events.   He denied taking his medications the morning of admission but reports generally being prescribed a combination that had previously worked well. He specifically noted he was not on a PRN benzodiazepine and requested the smallest dose of Ativan , stating he has no history of misuse. Appetite is described as reduced, with the patient reporting only one large meal daily and attributing this to a fast metabolism.   He also made vague references to having had prior manic episodes but claimed they are subtle and difficult to distinguish from his baseline. He identified himself as having knowledge of the DSM-5 and mentioned attending law school. He reports passive suicidal thoughts but denies homicidal ideation. He also made tangential statements, including blaming 911 for not having a house.   Overall, history was difficult to clarify due to limited reliability and disorganization of thought content.     Past Psychiatric Hx: Current Psychiatrist: Current Therapist: Previous Psychiatric Diagnoses:  Psychiatric Medications: Current   Psychiatric medication history:Abilify  15mg , Klonopin  0.5 BID, Benztropin 1mg  BID, valproate 500, depakote  500mg  daily, gabapentin  300 TID, Zyprexa  5-10mg , Melatonin 10mg . Trazodone . Abilify . OLANZapine .  Psychiatric medication compliance history:non compliance Psychiatric Hospitalization hx: Psychotherapy hx: Neuromodulation history:  History of suicide (obtained from HPI): History of homicide or aggression (obtained in HPI):   Substance Abuse Hx: Alcohol: Denies Tobacco: uses one roll of skoal daily.  Cannabis: uses a  delta8/delta9 vape pen  nightly for sleep Other Illicit drugs:Denies IVDU: Denies Rx drug abuse:documented hx of benzodiazepine abuse     Past Medical History: ZOX:WRUEAV Medical Dx: Denies Medications:Denies Allergies: zyprexa , hydroxyzyne, benadryl Hospitalizations:  Surgeries: ankle surgery Trauma: traumatic subdural hematoma, multiple concussions Seizures:denies     Family Psychiatric History: Mother bipolar disorder, grandfather mental health issues    Social History: Current Living Situation: Lives in [place] with mother Education:report she has gone to Social worker school Occupational hx: Unemployed Marital Status:Single Children: Denies Armed forces operational officer: denies upcoming court dates or legal charges Military:Denies Access to firearms: Denies    Continued Clinical Symptoms:  Alcohol Use Disorder Identification Test Final Score (AUDIT): 0 The "Alcohol Use Disorders Identification Test", Guidelines for Use in Primary Care, Second Edition.  World Science writer [facility]). Score between 0-7:  no or low risk or alcohol related problems. Score between 8-15:  moderate risk of alcohol related problems. Score between 16-19:  high risk of alcohol related problems. Score 20 or above:  warrants further diagnostic evaluation for alcohol dependence and treatment.   CLINICAL FACTORS:   Currently Psychotic  Psychiatric Specialty Exam:   Presentation  General Appearance:  Appropriate for Environment; Casual; Fairly Groomed   Eye Contact: Fair   Speech: Clear and Coherent (hyperverbal)   Speech Volume: Normal   Handedness: Right     Mood and Affect  Mood: -- (Labile, alternating between weeping and laughing)   Affect: Labile     Thought Process  Thought Processes: Disorganized   Descriptions of Associations: Tangential   Orientation: Full (Time, Place and Person)   Thought Content: Illogical; Rumination (grandiosity)   History of Schizophrenia/Schizoaffective  disorder: Yes   Duration of Psychotic Symptoms: > 6 months Hallucinations:Hallucinations: None   Ideas of Reference: None   Suicidal Thoughts:Suicidal Thoughts: No   Homicidal Thoughts:Homicidal Thoughts: No     Sensorium  Memory: Immediate Fair   Judgment: Poor   Insight: Poor     Executive Functions  Concentration: Poor   Attention Span: Fair   Recall: Fair   Fund of Knowledge: Fair   Language: Fair     Psychomotor Activity  Psychomotor Activity:Psychomotor Activity: Increased     Assets  Assets: Communication Skills; Desire for Improvement; Resilience     Sleep  Sleep:Sleep: Poor       Physical Exam: Physical Exam Vitals and nursing note reviewed.  Constitutional:      General: He is not in acute distress.    Appearance: He is not ill-appearing.  HENT:     Head: Normocephalic and atraumatic.  Eyes:     Conjunctiva/sclera: Conjunctivae normal.     Pupils: Pupils are equal, round, and reactive to light.  Musculoskeletal:        General: Normal range of motion.  Skin:    General: Skin is warm and dry.  Neurological:     General: No focal deficit present.      Review of Systems  All other systems reviewed and are negative.   Blood pressure (!) 125/94, pulse 86, temperature 98.5 F (36.9 C), temperature source Oral, resp. rate 18, height 5\' 11"  (1.803 m), weight 73.5 kg, SpO2 97%. Body mass index is 22.59 kg/m.     COGNITIVE FEATURES THAT CONTRIBUTE TO RISK:  None    SUICIDE RISK:   Moderate:  Frequent suicidal ideation with limited intensity, and duration, some specificity in terms of plans, no associated intent, good self-control, limited dysphoria/symptomatology, some risk factors present, and identifiable protective factors, including available and accessible social  support.  PLAN OF CARE: See H&P for assessment and plan.   I certify that inpatient services furnished can reasonably be expected to improve the patient's  condition.   Baltazar Bonier, MD 08/17/2023, 7:44 AM

## 2023-08-17 NOTE — Progress Notes (Signed)
 Pt cont to express anxiousness and agitation at times.  Pt reports that he uses " a can plus" a day of nicotine  pouches .  He has been given prn anxiety medication and nicotine  gum as ordered.  Pt reports relief from these interventions.

## 2023-08-17 NOTE — Group Note (Signed)
 Date:  08/17/2023 Time:  8:55 PM  Group Topic/Focus:  Wrap-Up Group:   The focus of this group is to help patients review their daily goal of treatment and discuss progress on daily workbooks.    Participation Level:  Did Not Attend   Ronald [name] 08/17/2023, 8:55 PM

## 2023-08-17 NOTE — Plan of Care (Signed)
  Problem: Education: Goal: Mental status will improve Outcome: Progressing Goal: Verbalization of understanding the information provided will improve Outcome: Progressing   Problem: Health Behavior/Discharge Planning: Goal: Compliance with treatment plan for underlying cause of condition will improve Outcome: Progressing   Problem: Safety: Goal: Periods of time without injury will increase Outcome: Progressing
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
## 2023-08-17 NOTE — Progress Notes (Signed)
   08/17/23 0800  Psych Admission Type (Psych Patients Only)  Admission Status Involuntary  Psychosocial Assessment  Patient Complaints Anxiety;Irritability  Eye Contact Glaring  Facial Expression Anxious  Affect Anxious;Flat;Labile  Speech Logical/coherent  Interaction Other (Comment) (fair)  Motor Activity Pacing  Appearance/Hygiene Unremarkable  Behavior Characteristics Anxious  Mood Anxious;Irritable  Thought Process  Coherency WDL  Content WDL  Delusions None reported or observed  Perception UTA  Hallucination None reported or observed  Judgment Impaired  Confusion Mild  Danger to Self  Current suicidal ideation? Denies  Self-Injurious Behavior No self-injurious ideation or behavior indicators observed or expressed   Agreement Not to Harm Self Yes  Description of Agreement verbal  Danger to Others  Danger to Others None reported or observed

## 2023-08-18 ENCOUNTER — Encounter (HOSPITAL_COMMUNITY): Payer: Self-pay

## 2023-08-18 DIAGNOSIS — F250 Schizoaffective disorder, bipolar type: Secondary | ICD-10-CM

## 2023-08-18 LAB — RPR: RPR Ser Ql: NONREACTIVE

## 2023-08-18 NOTE — Plan of Care (Signed)
   Problem: Education: Goal: Knowledge of [facility] Education information/materials will improve Outcome: Progressing Goal: Emotional status will improve Outcome: Progressing Goal: Mental status will improve Outcome: Progressing Goal: Verbalization of understanding the information provided will improve Outcome: Progressing

## 2023-08-18 NOTE — Group Note (Signed)
 LCSW Group Therapy Note   Group Date: 08/18/2023 Start Time: 1300 End Time: 1400   Participation:  Patient was present and actively participated in the discussion.  He shared personal experiences and was insightful.  Type of Therapy:  Group Therapy  Topic:  Healing Hearts:  A Safe Space for Grief  Objective:  To create a compassionate group space where participants can process grief, learn about its stages, and explore personal rituals to honor lost loved ones.   Goals:  1. Provide a safe and supportive space where participants feel comfortable sharing their feelings and experiences of grief without judgment.  2. Educate participants about the stages of grief and emphasize that there is no "right" way to grieve or a fixed timeline for healing.  3. Introduce the concept of rituals to process grief, allowing individuals to honor their loved ones in a personal and meaningful way.  Summary:  In Healing He[facility]: A Safe Space for Grief, participants explored the unique and personal nature of grief, with a focus on the five stages (denial, anger, bargaining, depression, acceptance) as a flexible, non-linear process. The group introduced meaningful rituals - like lighting candles or memory walks - as tools for honoring loved ones and managing difficult emotions. Through shared experiences, participants received emotional support, practiced self-care, and reflected on gratitude, emphasizing that healing has no fixed timeline.  Therapeutic Modalities Used: Cognitive Behavioral Therapy (CBT):   Psychoeducation on grief stages, Challenging unhelpful thoughts (e.g., "I should be over this") Dialectical Behavior Therapy (DBT):  Mindfulness (staying present with grief-related emotions), Distress tolerance (using rituals as grounding tools) Group Therapy/Supportive Elements:  Emotional validation and peer support, Encouragement of open sharing in a nonjudgmental space   Amos Balint, LCSWA 08/18/2023   5:42 PM
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
## 2023-08-18 NOTE — Progress Notes (Signed)
 43 year old Caucasian male with history of schizoaffective disorder bipolar type and substance use disorder.  Involuntarily committed on account of aggressive behavior and mania.  UDS was positive for THC at presentation.  Nursing staff reports that he has been very argumentative. At the team meeting, patient is malodorous and poorly kempt.  He perseverated on the need to be treated with benzodiazepines as he has been on them on a chronic basis.  Importance of adding a second generation mood stabilizer/antipsychotic was discussed with him.   We will likely seek for forced medication for an SGA.  We will taper her off benzodiazepines with time.

## 2023-08-18 NOTE — Group Note (Signed)
 Date:  08/18/2023 Time:  8:49 PM  Group Topic/Focus:  Wrap-Up Group:   The focus of this group is to help patients review their daily goal of treatment and discuss progress on daily workbooks.    Participation Level:  Active  Participation Quality:  Appropriate  Affect:  Appropriate  Cognitive:  Appropriate  Insight: Appropriate  Engagement in Group:  Engaged  Modes of Intervention:  Education and Exploration  Additional Comments:  Patient attended and participated in group tonight.  He reports that the thing he like about himself is that he is easy going.  He needs to improve his patience.  Eugena Herter Dacosta 08/18/2023, 8:49 PM

## 2023-08-18 NOTE — Progress Notes (Signed)
 DAR NOTE: Patient presents with anxious affect and irritable mood.  Denies suicidal thoughts, auditory and visual hallucinations.  Patient demanding, needy and requesting to be moved to another unit.  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  Rates depression at 1, hopelessness at 0, and anxiety at 0.  Maintained on routine safety checks.  Medications given as prescribed.  Support and encouragement offered as needed.  Attended group and participated.  Patient observed socializing with peers in the dayroom.  Patient is safe on the unit.

## 2023-08-18 NOTE — Progress Notes (Signed)
   08/18/23 0000  Psych Admission Type (Psych Patients Only)  Admission Status Involuntary  Psychosocial Assessment  Patient Complaints Anxiety;Irritability  Eye Contact Glaring  Facial Expression Anxious  Affect Anxious;Labile  Speech Midwife Activity Restless  Appearance/Hygiene Unremarkable  Behavior Characteristics Anxious  Mood Anxious;Irritable  Thought Process  Coherency WDL  Content WDL  Delusions None reported or observed  Perception UTA  Hallucination None reported or observed  Judgment Impaired  Confusion Mild  Danger to Self  Current suicidal ideation? Denies  Self-Injurious Behavior No self-injurious ideation or behavior indicators observed or expressed   Agreement Not to Harm Self Yes  Description of Agreement verbal  Danger to Others  Danger to Others None reported or observed

## 2023-08-18 NOTE — BH IP Treatment Plan (Signed)
 Interdisciplinary Treatment and Diagnostic Plan Update  08/18/2023 Time of Session: 1157 Ronald [name] MRN: [id number]  Principal Diagnosis: Schizoaffective disorder [name])  Secondary Diagnoses: Principal Problem:   Schizoaffective disorder (HCC) Active Problems:   Substance induced mood disorder (HCC)   Current Medications:  Current Facility-Administered Medications  Medication Dose Route Frequency Provider Last Rate Last Admin   acetaminophen  (TYLENOL ) tablet 650 mg  650 mg Oral Q4H PRN Mills, Shnese E, NP       alum & mag hydroxide-simeth (MAALOX/MYLANTA) 200-200-20 MG/5ML suspension 30 mL  30 mL Oral Q6H PRN Mills, Shnese E, NP       divalproex  (DEPAKOTE  ER) 24 hr tablet 750 mg  750 mg Oral BID Parker, Alvin S, MD   750 mg at 08/18/23 1653   gabapentin  (NEURONTIN ) capsule 200 mg  200 mg Oral TID Mills, Shnese E, NP   200 mg at 08/18/23 1653   haloperidol  (HALDOL ) tablet 5 mg  5 mg Oral TID PRN Mills, Shnese E, NP       haloperidol  lactate (HALDOL ) injection 10 mg  10 mg Intramuscular TID PRN Mills, Shnese E, NP       haloperidol  lactate (HALDOL ) injection 5 mg  5 mg Intramuscular TID PRN Doneen Fuelling, NP       And   LORazepam  (ATIVAN ) injection 2 mg  2 mg Intramuscular TID PRN Mills, Shnese E, NP       LORazepam  (ATIVAN ) tablet 1 mg  1 mg Oral TID PRN Mills, Shnese E, NP   1 mg at 08/17/23 1410   magnesium  hydroxide (MILK OF MAGNESIA) suspension 30 mL  30 mL Oral Daily PRN Mills, Shnese E, NP       magnesium  oxide (MAG-OX) tablet 400 mg  400 mg Oral QHS [name], [name], NP   400 mg at 08/17/23 2008   multivitamin with minerals tablet 1 tablet  1 tablet Oral Daily Carrion-Carrero, [name], MD   1 tablet at 08/18/23 4098   nicotine  (NICODERM CQ  - dosed in mg/24 hours) patch 14 mg  14 mg Transdermal Daily [name], Nadir, MD   14 mg at 08/18/23 1191   nicotine  polacrilex (NICORETTE ) gum 2 mg  2 mg Oral PRN Mills, Shnese E, NP   2 mg at 08/18/23 1459   ondansetron  (ZOFRAN ) tablet 4  mg  4 mg Oral Q8H PRN Mills, Shnese E, NP       traZODone  (DESYREL ) tablet 100 mg  100 mg Oral QHS Carrion-Carrero, [name], MD   100 mg at 08/17/23 2008   PTA Medications: Medications Prior to Admission  Medication Sig Dispense Refill Last Dose/Taking   divalproex  (DEPAKOTE  ER) 500 MG 24 hr tablet Take 1 tablet (500 mg total) by mouth 2 (two) times daily. 60 tablet 3    gabapentin  (NEURONTIN ) 100 MG capsule Take 2 capsules (200 mg total) by mouth 3 (three) times daily. 180 capsule 3    Ibuprofen  200 MG CAPS Take 800 mg by mouth every 6 (six) hours as needed (for headaches or pain).      LORazepam  (ATIVAN ) 1 MG tablet Take 1 mg by mouth every 8 (eight) hours as needed for anxiety.      MAGNESIUM  OXIDE, ELEMENTAL, PO Take 500 mg by mouth at bedtime.      Melatonin 10 MG TABS Take 10 mg by mouth at bedtime. 30 tablet 3    Multiple Vitamin (MULTIVITAMIN WITH MINERALS) TABS tablet Take 1 tablet by mouth daily with breakfast.  nicotine  (NICODERM CQ  - DOSED IN MG/24 HOURS) 14 mg/24hr patch Place 1 patch (14 mg total) onto the skin daily. (Patient not taking: Reported on 08/15/2023) 28 patch 0    nicotine  (NICODERM CQ  - DOSED IN MG/24 HOURS) 21 mg/24hr patch Place 21 mg onto the skin daily.      nicotine  polacrilex (NICORETTE ) 2 MG gum Take 1 each (2 mg total) by mouth every 4 (four) hours as needed for smoking cessation (while awake). (Patient taking differently: Take 2 mg by mouth every 2 (two) hours as needed for smoking cessation (while awake).) 100 tablet 0    Nutritional Supplements (ENSURE ORIGINAL) LIQD Take 237 mLs by mouth daily.      QUEtiapine  (SEROQUEL ) 50 MG tablet Take 1 tablet (50 mg total) by mouth at bedtime. (Patient not taking: Reported on 08/15/2023) 7 tablet 0    traZODone  (DESYREL ) 50 MG tablet Take 1 tablet (50 mg total) by mouth at bedtime. 30 tablet 3     Patient Stressors: Financial difficulties   Medication change or noncompliance    Patient Strengths: Ability for Teacher, adult education for treatment/growth  Supportive family/friends   Treatment Modalities: Medication Management, Group therapy, Case management,  1 to 1 session with clinician, Psychoeducation, Recreational therapy.   Physician Treatment Plan for Primary Diagnosis: Schizoaffective disorder (HCC) Long Term Goal(s): Improvement in symptoms so as ready for discharge   Short Term Goals: Ability to identify changes in lifestyle to reduce recurrence of condition will improve Ability to verbalize feelings will improve Ability to disclose and discuss suicidal ideas Ability to demonstrate self-control will improve  Medication Management: Evaluate patient's response, side effects, and tolerance of medication regimen.  Therapeutic Interventions: 1 to 1 sessions, Unit Group sessions and Medication administration.  Evaluation of Outcomes: Not Progressing  Physician Treatment Plan for Secondary Diagnosis: Principal Problem:   Schizoaffective disorder (HCC) Active Problems:   Substance induced mood disorder (HCC)  Long Term Goal(s): Improvement in symptoms so as ready for discharge   Short Term Goals: Ability to identify changes in lifestyle to reduce recurrence of condition will improve Ability to verbalize feelings will improve Ability to disclose and discuss suicidal ideas Ability to demonstrate self-control will improve     Medication Management: Evaluate patient's response, side effects, and tolerance of medication regimen.  Therapeutic Interventions: 1 to 1 sessions, Unit Group sessions and Medication administration.  Evaluation of Outcomes: Not Progressing   RN Treatment Plan for Primary Diagnosis: Schizoaffective disorder (HCC) Long Term Goal(s): Knowledge of disease and therapeutic regimen to maintain health will improve  Short Term Goals: Ability to remain free from injury will improve, Ability to verbalize frustration and anger appropriately will improve,  Ability to demonstrate self-control, Ability to participate in decision making will improve, Ability to verbalize feelings will improve, Ability to disclose and discuss suicidal ideas, Ability to identify and develop effective coping behaviors will improve, and Compliance with prescribed medications will improve  Medication Management: RN will administer medications as ordered by provider, will assess and evaluate patient's response and provide education to patient for prescribed medication. RN will report any adverse and/or side effects to prescribing provider.  Therapeutic Interventions: 1 on 1 counseling sessions, Psychoeducation, Medication administration, Evaluate responses to treatment, Monitor vital signs and CBGs as ordered, Perform/monitor CIWA, COWS, AIMS and Fall Risk screenings as ordered, Perform wound care treatments as ordered.  Evaluation of Outcomes: Not Progressing   LCSW Treatment Plan for Primary Diagnosis: Schizoaffective disorder [facility]) Long Term  Goal(s): Safe transition to appropriate next level of care at discharge, Engage patient in therapeutic group addressing interpersonal concerns.  Short Term Goals: Engage patient in aftercare planning with referrals and resources, Increase ability to appropriately verbalize feelings, Facilitate acceptance of mental health diagnosis and concerns, Identify triggers associated with mental health/substance abuse issues, and Increase skills for wellness and recovery  Therapeutic Interventions: Assess for all discharge needs, 1 to 1 time with Social worker, Explore available resources and support systems, Assess for adequacy in community support network, Educate family and significant other(s) on suicide prevention, Complete Psychosocial Assessment, Interpersonal group therapy.  Evaluation of Outcomes: Not Progressing   Progress in Treatment: Attending groups: Yes. Participating in groups: Yes. Taking medication as prescribed:  Yes. Toleration medication: Yes. Family/Significant other contact made: Yes, individual(s) contacted:  Consuella Denis [phone] Patient understands diagnosis: Yes. Discussing patient identified problems/goals with staff: Yes. Medical problems stabilized or resolved: Yes. Denies suicidal/homicidal ideation: Yes. Issues/concerns per patient self-inventory: No. Other: n/a  New problem(s) identified: No, Describe:  none  New Short Term/Long Term Goal(s): medication stabilization, elimination of SI thoughts, development of comprehensive mental wellness plan.   Patient Goals:  "Medication regulation and coping skills"  Discharge Plan or Barriers: Patient recently admitted. CSW will continue to follow and assess for appropriate referrals and possible discharge planning.    Reason for Continuation of Hospitalization: Anxiety Mania Medication stabilization Other; describe mood stabilization, discharge planning   Estimated Length of Stay: 5-7 DAYS  Last 3 [place] Suicide Severity Risk Score: Flowsheet Row [facility] (Current) [facility] from [facility]14/2023 in [facility] ED from 06/26/2023 in [facility] Department at [facility]  C-SSRS RISK CATEGORY No Risk No Risk No Risk       Last PHQ 2/9 Scores:    06/13/2023    8:06 AM 11/08/2022   10:00 AM 01/14/2022    1:09 PM  Depression screen PHQ 2/9  Decreased Interest 0 0 0  Down, Depressed, Hopeless 0 0 0  PHQ - 2 Score 0 0 0  Altered sleeping 0 0 0  Tired, decreased energy 0 0 0  Change in appetite 0 0 0  Feeling bad or failure about yourself  0 0 0  Trouble concentrating 0 0 0  Moving slowly or fidgety/restless 0 0 0  Suicidal thoughts 0 0 0  PHQ-9 Score 0 0 0  Difficult doing work/chores Not difficult at all      Scribe for Treatment Team: Pacey Willadsen N Michiko Lineman, LCSW 08/18/2023 6:07 PM

## 2023-08-18 NOTE — Plan of Care (Signed)
   Problem: Education: Goal: Emotional status will improve Outcome: Not Progressing Goal: Mental status will improve Outcome: Not Progressing

## 2023-08-18 NOTE — Progress Notes (Signed)
 Recreation Therapy Notes  INPATIENT RECREATION THERAPY ASSESSMENT  Patient Details Name: SAADIQ OLMSTED MRN: [id number] DOB: [birth year] Today's Date: 08/18/2023       Information Obtained From: Patient  Able to Participate in Assessment/Interview: Yes  Patient Presentation: Hyperverbal  Reason for Admission (Per Patient): Other (Comments) (Pt stated he was having issues with anxiety. Pt also stated his mother is getting over cancer and went into the hospital the night he came here. Pt also expressed at one point he called his mother and the line was busy, which triggered his anxiety.)  Patient Stressors: Other (Comment) (worrying about his mother)  Coping Skills:   Sports, TV, Music, Exercise, Meditate, Deep Breathing, Talk, Art, Avoidance  Leisure Interests (2+):  Individual - Writing, Individual - Other (Comment) (writing poetry, yoga/tai chi)  Frequency of Recreation/Participation: Weekly  Awareness of Community Resources:  Yes  Community Resources:  Park, Engineering geologist, Public affairs consultant  Current Use: Yes  If no, Barriers?:    Expressed Interest in [employer] Information: No  [employer] of Residence:  Engineer, technical sales  Patient Main Form of Transportation: Set designer  Patient Strengths:  Educated  Patient Identified Areas of Improvement:  "life is just important, I look at life as art"  Patient Goal for Hospitalization:  "worry about myself, take my meds, go to groups and learn coping skills"  Current SI (including self-harm):  No  Current HI:  No  Current AVH: No  Staff Intervention Plan: Group Attendance, Collaborate with Interdisciplinary Treatment Team  Consent to Intern Participation: N/A   Briya Lookabaugh-McCall, LRT,CTRS Briele Lagasse A Cabe Lashley-McCall 08/18/2023, 2:38 PM

## 2023-08-18 NOTE — Progress Notes (Signed)
 Pt is awake. He is restless and irritable. Pt came to nurse station several times requesting the name and contact information of the director.   Pt educated that if he has concerns that he want to talk to beyond the nurse, he can speak to the charge nurse as a first step.   He demanded that his mother needed to speak to the director. He was redirected back to his room. He was able to follow directions and return to the room.

## 2023-08-18 NOTE — Group Note (Signed)
 Recreation Therapy Group Note   Group Topic:Stress Management  Group Date: 08/18/2023 Start Time: 1030 End Time: 1105 Facilitators: Briawna Carver-McCall, LRT,CTRS Location: [address] Dayroom   Group Topic:Stress Management  Group Date: 08/18/2023 Start Time: 1030 End Time: 1105 Facilitators: Noreen Mackintosh-McCall, LRT,CTRS Location: [address] Dayroom     Group Topic: Stress Management   Goal Area(s) Addresses:  Patient will identify positive stress management techniques. Patient will identify benefits of using stress management post d/c.   Intervention: Music, Speaker   Activity: Music Therapy. LRT discussed the importance music can play helping to relieve stress and helping people to create calm spaces. Patients could request the songs of their choosing that were clean, appropriate and provided a special feeling to individual.     Education:  Stress Management, Discharge Planning.    Education Outcome: Acknowledges Education   Affect/Mood: Appropriate   Participation Level: Engaged   Participation Quality: Independent   Behavior: Appropriate   Speech/Thought Process: Focused   Insight: Good   Judgement: Good   Modes of Intervention: Music   Patient Response to Interventions:  Engaged   Education Outcome:  In group clarification offered    Clinical Observations/Individualized Feedback: Pt was attentive and social with peers. Pt described the songs he picked as important to him because one reminded him of his brother who was murdered and the other was one he listened to in law school.    Plan: Continue to engage patient in RT group sessions 2-3x/week.   Ronald [name], LRT,CTRS 08/18/2023 1:39 PM

## 2023-08-18 NOTE — Progress Notes (Signed)
 [facility] MD Progress Note  08/18/2023 1:47 PM Ronald [name]  MRN:  [id number] Subjective:   Ronald [name] is a 43 yr old male who presented on 5/2 to [facility] under IVC due to bizarre, aggressive behavior and hallucinations, he was admitted to [facility] on 5/4.  PPHx is significant for Schizoaffective Disorder, Bipolar Type, GAD, PTSD, and Polysubstance Abuse (Benzo, THC), and 5 Prior Psychiatric Hospitalizations (last- [facility] 05/2023).  Case was discussed in the multidisciplinary team. MAR was reviewed and patient was compliant with medications.  He received PRN Ativan  yesterday.   Psychiatric Team made the following recommendations yesterday: -Increase Depakote  to 750 mg BID for mood stability. -Restart Gabapentin  200 mg TID for mood stability.    On interview today patient reports he slept poor last night.  He reports his appetite is doing fair.  He reports no SI, HI, or AVH.  He reports no Paranoia or Ideas of Reference.  He reports no issues with his medications.  He reports that he does not need to be on this side of the hallway.  He reports that when he was here last time he was on the different hallway.  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  He reports no other concerns at present.   Principal Problem: Schizoaffective disorder (HCC) Diagnosis: Principal Problem:   Schizoaffective disorder (HCC) Active Problems:   Substance induced mood disorder (HCC)  Total Time spent with patient:  I personally spent 35 minutes on the unit in direct patient care. The direct patient care time included face-to-face time with the patient, reviewing the patient's chart, communicating with other professionals, and coordinating care. Greater than 50% of this time was spent in counseling or coordinating care with the patient regarding goals of hospitalization, psycho-education, and discharge planning needs.   Past Psychiatric History:   Schizoaffective Disorder, Bipolar Type, GAD, PTSD, and Polysubstance Abuse (Benzo, THC), and 5 Prior Psychiatric Hospitalizations (last- [facility] 05/2023).  Past Medical History:  Past Medical History:  Diagnosis Date   Anxiety    Arthritis    Disorder of pineal gland    Post traumatic stress disorder     Past Surgical History:  Procedure Laterality Date   ANKLE SURGERY     Family History: History reviewed. No pertinent family history. Family Psychiatric  History:  No Known  Social History:  Social History   Substance and Sexual Activity  Alcohol Use No     Social History   Substance and Sexual Activity  Drug Use Yes   Types: Marijuana   Comment: one year ago    Social History   Socioeconomic History   Marital status: Single    Spouse name: Not on file   Number of children: Not on file   Years of education: Not on file   Highest education level: Not on file  Occupational History   Not on file  Tobacco Use   Smoking status: Never   Smokeless tobacco: Current    Types: Chew  Vaping Use   Vaping status: Never Used  Substance and Sexual Activity   Alcohol use: No   Drug use: Yes    Types: Marijuana    Comment: one year ago   Sexual activity: Not Currently  Other Topics Concern   Not on file  Social History Narrative   Not on file   Social Drivers of Health   Financial Resource Strain:  Not on file  Food Insecurity: Patient Declined (08/16/2023)   Hunger Vital Sign    Worried About Running Out of Food in the Last Year: Patient declined    Ran Out of Food in the Last Year: Patient declined  Transportation Needs: Patient Declined (08/16/2023)   PRAPARE - Administrator, Civil Service (Medical): Patient declined    Lack of Transportation (Non-Medical): Patient declined  Physical Activity: Not on file  Stress: Not on file  Social Connections: Not on file   Additional Social History:                         Sleep: Good per hours per pt  report not good  Appetite:  Poor  Current Medications: Current Facility-Administered Medications  Medication Dose Route Frequency Provider Last Rate Last Admin   acetaminophen  (TYLENOL ) tablet 650 mg  650 mg Oral Q4H PRN Mills, Shnese E, NP       alum & mag hydroxide-simeth (MAALOX/MYLANTA) 200-200-20 MG/5ML suspension 30 mL  30 mL Oral Q6H PRN Mills, Shnese E, NP       divalproex  (DEPAKOTE  ER) 24 hr tablet 750 mg  750 mg Oral BID Parker, Alvin S, MD   750 mg at 08/18/23 8657   gabapentin  (NEURONTIN ) capsule 200 mg  200 mg Oral TID Mills, Shnese E, NP   200 mg at 08/18/23 1131   haloperidol  (HALDOL ) tablet 5 mg  5 mg Oral TID PRN Mills, Shnese E, NP       haloperidol  lactate (HALDOL ) injection 10 mg  10 mg Intramuscular TID PRN Mills, Shnese E, NP       haloperidol  lactate (HALDOL ) injection 5 mg  5 mg Intramuscular TID PRN Doneen Fuelling, NP       And   LORazepam  (ATIVAN ) injection 2 mg  2 mg Intramuscular TID PRN Mills, Shnese E, NP       LORazepam  (ATIVAN ) tablet 1 mg  1 mg Oral TID PRN Mills, Shnese E, NP   1 mg at 08/17/23 1410   magnesium  hydroxide (MILK OF MAGNESIA) suspension 30 mL  30 mL Oral Daily PRN Mills, Shnese E, NP       magnesium  oxide (MAG-OX) tablet 400 mg  400 mg Oral QHS [name], [name], NP   400 mg at 08/17/23 2008   multivitamin with minerals tablet 1 tablet  1 tablet Oral Daily Carrion-Carrero, [name], MD   1 tablet at 08/18/23 0817   nicotine  (NICODERM CQ  - dosed in mg/24 hours) patch 14 mg  14 mg Transdermal Daily Attiah, Nadir, MD   14 mg at 08/18/23 0817   nicotine  polacrilex (NICORETTE ) gum 2 mg  2 mg Oral PRN Mills, Shnese E, NP   2 mg at 08/18/23 1131   ondansetron  (ZOFRAN ) tablet 4 mg  4 mg Oral Q8H PRN Mills, Shnese E, NP       traZODone  (DESYREL ) tablet 100 mg  100 mg Oral QHS Carrion-Carrero, [name], MD   100 mg at 08/17/23 2008    Lab Results:  Results for orders placed or performed during the hospital encounter of 08/16/23 (from the past 48 hours)   Folate     Status: None   Collection Time: 08/17/23  6:41 PM  Result Value Ref Range   Folate 14.9 >5.9 ng/mL    Comment: Performed at [facility], [address] [place], [name] [address]  RPR     Status: None  Collection Time: 08/17/23  6:41 PM  Result Value Ref Range   RPR Ser Ql NON REACTIVE NON REACTIVE    Comment: Performed at [facility], [address] [place], [name] [address]  Vitamin B12     Status: None   Collection Time: 08/17/23  6:41 PM  Result Value Ref Range   Vitamin B-12 806 180 - 914 pg/mL    Comment: (NOTE) This assay is not validated for testing neonatal or myeloproliferative syndrome specimens for Vitamin B12 levels. Performed at [facility], [address] [place], [name] [address]   VITAMIN D 25 Hydroxy (Vit-D Deficiency, Fractures)     Status: None   Collection Time: 08/17/23  6:41 PM  Result Value Ref Range   Vit D, 25-Hydroxy 97.20 30 - 100 ng/mL    Comment: (NOTE) Vitamin D deficiency has been defined by the Institute of Medicine  and an Endocrine Society practice guideline as a level of serum 25-OH  vitamin D less than 20 ng/mL (1,2). The Endocrine Society went on to  further define vitamin D insufficiency as a level between 21 and 29  ng/mL (2).  1. IOM (Institute of Medicine). 2010. Dietary reference intakes for  calcium and D. [place]: [employer]. 2. Holick MF, Binkley [place], Bischoff-Ferrari HA, et al. Evaluation,  treatment, and prevention of vitamin D deficiency: an Endocrine  Society clinical practice guideline, JCEM. 2011 Jul; 96(7): 1911-30.  Performed at [facility], [address], [place], [name] [address]   Valproic acid  level     Status: Abnormal   Collection Time: 08/17/23  6:41 PM  Result Value Ref Range   Valproic Acid  Lvl 39 (L) 50 - 100 ug/mL    Comment: Performed at [facility], [address], [place], [name] [address]     Blood Alcohol level:  Lab Results  Component Value Date   [facility] <15 08/15/2023   ETH <10 05/17/2023    Metabolic Disorder Labs: Lab Results  Component Value Date   HGBA1C 4.9 05/17/2023   MPG 93.93 05/17/2023   MPG 93.93 09/29/2021   No results found for: "PROLACTIN" Lab Results  Component Value Date   CHOL 152 05/17/2023   TRIG 70 05/17/2023   HDL 78 05/17/2023   CHOLHDL 1.9 05/17/2023   VLDL 14 05/17/2023   LDLCALC 60 05/17/2023   LDLCALC 55 02/19/2020    Physical Findings: AIMS:  , ,  ,  ,    CIWA:    COWS:     Musculoskeletal: Strength & Muscle Tone: within normal limits Gait & Station: normal Patient leans: N/A  Psychiatric Specialty Exam:  Presentation  General Appearance:  Appropriate for Environment; Casual  Eye Contact: Fair  Speech: Clear and Coherent (rapid)  Speech Volume: Normal  Handedness: Right   Mood and Affect  Mood: Anxious  Affect: Labile   Thought Process  Thought Processes: Disorganized  Descriptions of Associations:Tangential  Orientation:Full (Time, Place and Person)  Thought Content:Illogical; Rumination; Scattered  History of Schizophrenia/Schizoaffective disorder:Yes  Duration of Psychotic Symptoms:Less than six months  Hallucinations:Hallucinations: None  Ideas of Reference:None  Suicidal Thoughts:Suicidal Thoughts: No  Homicidal Thoughts:Homicidal Thoughts: No   Sensorium  Memory: Immediate Fair  Judgment: Poor  Insight: Poor   Executive Functions  Concentration: Poor  Attention Span: Poor  Recall: [employer] of Knowledge: Fair  Language: Fair   Psychomotor Activity  Psychomotor Activity: Psychomotor Activity: Normal   Assets  Assets:  Communication Skills; Desire for Improvement; Resilience; Physical Health   Sleep  Sleep: Sleep: Good Number of Hours of Sleep: 7.75    Physical Exam: Physical Exam Vitals and nursing note reviewed.  Constitutional:       General: He is not in acute distress.    Appearance: Normal appearance. He is normal weight. He is not ill-appearing or toxic-appearing.  HENT:     Head: Normocephalic and atraumatic.  Pulmonary:     Effort: Pulmonary effort is normal.  Musculoskeletal:        General: Normal range of motion.  Neurological:     General: No focal deficit present.     Mental Status: He is alert.    Review of Systems  Respiratory:  Negative for cough and shortness of breath.   Cardiovascular:  Negative for chest pain.  Gastrointestinal:  Negative for abdominal pain, constipation, diarrhea, nausea and vomiting.  Neurological:  Negative for dizziness, weakness and headaches.  Psychiatric/Behavioral:  Negative for depression, hallucinations and suicidal ideas. The patient is nervous/anxious.    Blood pressure (!) 125/96, pulse 82, temperature 98.9 F (37.2 C), temperature source Oral, resp. rate 18, height 5\' 11"  (1.803 m), weight 73.5 kg, SpO2 98%. Body mass index is 22.59 kg/m.   Treatment Plan Summary: Daily contact with patient to assess and evaluate symptoms and progress in treatment and Medication management  Ronald [name] is a 43 yr old male who presented on 5/2 to [facility] under IVC due to bizarre, aggressive behavior and hallucinations, he was admitted to [facility] on 5/4.  PPHx is significant for Schizoaffective Disorder, Bipolar Type, GAD, PTSD, and Polysubstance Abuse (Benzo, THC), and 5 Prior Psychiatric Hospitalizations (last- [facility] 05/2023).    Zyonn has tolerated the increase in his Depakote .  He continues to be irritable, delusional, and intrusive.  If he does not begin to improve we will need to consider starting an antipsychotic.  We will not make any changes to his medications at this time.  We will continue to monitor.    Schizoaffective Disorder, Bipolar Type: -Continue Depakote  750 mg BID for mood stability -Continue Gabapentin  200 mg TID for mood stability -Continue Agitation Protocol:  Haldol /Ativan    Nicotine  Dependence: -Continue Nicotine  Patch 14 mg daily -Continue Nicotine  Gum 2 mg PRN   -Continue Mag-Ox 400 mg QHS -Continue Multivitamin daily -Continue Trazodone  100 mg QHS -Continue PRN's: Tylenol , Maalox, Milk of Magnesia   Basilia Bosworth, MD 08/18/2023, 1:47 PM

## 2023-08-18 NOTE — Progress Notes (Signed)
 Pt presents with anxious affect and irritable mood. Pt came out his room and alerted staff that his [facility] unit was sending out burning heat. Writer check AC unit and turned th AC unit off heat and to Cool 68. Pt turned the [facility] unit back to heat and ran up screaming and yelling at Clinical research associate. Writer went back in the room and change the [facility] unit back to [facility] 68 for pt. Writer alerted pt on the change and he was safe on the unit. Pt nurse was updated on the situation.Pt called his mother and stated writer was racist towards pt and would not help pt. Pt's nurse talk with pt. Pt apologize to Clinical research associate. Writer explained to pt he understood and is here to help and assurred pt he is safe on the unit.

## 2023-08-19 MED ORDER — HALOPERIDOL 2 MG PO TABS: 2.0000 mg | ORAL_TABLET | Freq: Two times a day (BID) | ORAL | Status: DC

## 2023-08-19 MED ORDER — LORAZEPAM 0.5 MG PO TABS: 0.5000 mg | ORAL_TABLET | Freq: Three times a day (TID) | ORAL | Status: DC | PRN

## 2023-08-19 MED ORDER — HALOPERIDOL 5 MG PO TABS
5.0000 mg | ORAL_TABLET | Freq: Two times a day (BID) | ORAL | Status: DC
  Administered 2023-08-19 (×2): 5 mg via ORAL
  Filled 2023-08-19 (×9): qty 1

## 2023-08-19 MED ORDER — LORAZEPAM 1 MG PO TABS
1.0000 mg | ORAL_TABLET | Freq: Three times a day (TID) | ORAL | Status: DC | PRN
Start: 2023-08-19 — End: 2023-08-28
  Administered 2023-08-19 – 2023-08-28 (×12): 1 mg via ORAL
  Filled 2023-08-19 (×14): qty 1

## 2023-08-19 NOTE — Progress Notes (Addendum)
 Ronald [name] Progress Note  08/19/2023 11:34 AM Ronald [name]  MRN:  [id number] Subjective:   Ronald [name] is [name] 43 yr old male who presented on 5/2 to [facility] under IVC due to bizarre, aggressive behavior and hallucinations, he was admitted to [facility] on 5/4.  PPHx is significant for Schizoaffective Disorder, Bipolar Type, GAD, PTSD, and Polysubstance Abuse (Benzo, THC), and 5 Prior Psychiatric Hospitalizations (last- [facility] 05/2023).  Case was discussed in the multidisciplinary team. MAR was reviewed and patient was compliant with medications.  He received agitation PRN Haldol  yesterday.   Psychiatric Team made the following recommendations yesterday: -Continue Depakote  750 mg BID for mood stability -Continue Gabapentin  200 mg TID for mood stability   On interview today patient reports he slept fair last night after receiving the PRN medications.  He reports his appetite is doing fair.  He reports no SI, HI, or AVH.  He reports no Paranoia or Ideas of Reference.  He reports no issues with his medications.  Discussed with him that since he continues to have issues with anxiety and he reports that the PRN medications helped last night.  Discussed with him that since the PRN Haldol  helped with his anxiety we could make this scheduled and he was agreeable with this.  He reports his Depakote  being given early is not how he takes it at home but when offered to change the time he decided he would continue to take the second dose at 5 PM and take the Haldol  at 8 PM.  He reports no other concerns at present.   Principal Problem: Schizoaffective disorder (HCC) Diagnosis: Principal Problem:   Schizoaffective disorder (HCC) Active Problems:   Substance induced mood disorder (HCC)  Total Time spent with patient:  I personally spent 35 minutes on the unit in direct patient care. The direct patient care time included face-to-face time with the patient, reviewing the patient'[name] chart, communicating with other  professionals, and coordinating care. Greater than 50% of this time was spent in counseling or coordinating care with the patient regarding goals of hospitalization, psycho-education, and discharge planning needs.   Past Psychiatric History:  Schizoaffective Disorder, Bipolar Type, GAD, PTSD, and Polysubstance Abuse (Benzo, THC), and 5 Prior Psychiatric Hospitalizations (last- [facility] 05/2023).  Past Medical History:  Past Medical History:  Diagnosis Date   Anxiety    Arthritis    Disorder of pineal gland    Post traumatic stress disorder     Past Surgical History:  Procedure Laterality Date   ANKLE SURGERY     Family History: History reviewed. No pertinent family history. Family Psychiatric  History:  No Known  Social History:  Social History   Substance and Sexual Activity  Alcohol Use No     Social History   Substance and Sexual Activity  Drug Use Yes   Types: Marijuana   Comment: one year ago    Social History   Socioeconomic History   Marital status: Single    Spouse name: Not on file   Number of children: Not on file   Years of education: Not on file   Highest education level: Not on file  Occupational History   Not on file  Tobacco Use   Smoking status: Never   Smokeless tobacco: Current    Types: Chew  Vaping Use   Vaping status: Never Used  Substance and Sexual Activity   Alcohol use: No   Drug use: Yes    Types: Marijuana    Comment: one  year ago   Sexual activity: Not Currently  Other Topics Concern   Not on file  Social History Narrative   Not on file   Social Drivers of Health   Financial Resource Strain: Not on file  Food Insecurity: Patient Declined (08/16/2023)   Hunger Vital Sign    Worried About Running Out of Food in the Last Year: Patient declined    Ran Out of Food in the Last Year: Patient declined  Transportation Needs: Patient Declined (08/16/2023)   PRAPARE - Administrator, Civil Service (Medical): Patient declined     Lack of Transportation (Non-Medical): Patient declined  Physical Activity: Not on file  Stress: Not on file  Social Connections: Not on file   Additional Social History:                         Sleep: Good   Appetite:  Poor  Current Medications: Current Facility-Administered Medications  Medication Dose Route Frequency Provider Last Rate Last Admin   acetaminophen  (TYLENOL ) tablet 650 mg  650 mg Oral Q4H PRN Ronald [name], Ronald [name], Ronald [name]       alum & mag hydroxide-simeth (MAALOX/MYLANTA) 200-200-20 MG/5ML suspension 30 mL  30 mL Oral Q6H PRN Ronald [name], Ronald [name], Ronald [name]       divalproex  (DEPAKOTE  ER) 24 hr tablet 750 mg  750 mg Oral BID Ronald [name], Ronald [name], [name]   750 mg at 08/19/23 0830   gabapentin  (NEURONTIN ) capsule 200 mg  200 mg Oral TID Ronald [name], Ronald [name], Ronald [name]   200 mg at 08/19/23 0831   haloperidol  (HALDOL ) tablet 5 mg  5 mg Oral TID PRN Ronald [name], Ronald [name], Ronald [name]   5 mg at 08/18/23 2106   haloperidol  (HALDOL ) tablet 5 mg  5 mg Oral BID Ronald Hutchinson [name], [name]   5 mg at 08/19/23 8119   haloperidol  lactate (HALDOL ) injection 10 mg  10 mg Intramuscular TID PRN Ronald [name], Ronald [name], Ronald [name]       haloperidol  lactate (HALDOL ) injection 5 mg  5 mg Intramuscular TID PRN Ronald Fuelling, Ronald [name]       And   LORazepam  (ATIVAN ) injection 2 mg  2 mg Intramuscular TID PRN Ronald [name], Ronald [name], Ronald [name]       LORazepam  (ATIVAN ) tablet 1 mg  1 mg Oral TID PRN Ronald [name], Ronald [name], [name]       magnesium  hydroxide (MILK OF MAGNESIA) suspension 30 mL  30 mL Oral Daily PRN Ronald [name], Ronald [name], Ronald [name]       magnesium  oxide (MAG-OX) tablet 400 mg  400 mg Oral QHS Ronald [name], Ronald [name], Ronald [name]   400 mg at 08/18/23 2034   multivitamin with minerals tablet 1 tablet  1 tablet Oral Daily Ronald [name], Ronald Martin, [name]   1 tablet at 08/19/23 0831   nicotine  (NICODERM CQ  - dosed in mg/24 hours) patch 14 mg  14 mg Transdermal Daily Ronald [name], Nadir, [name]   14 mg at 08/19/23 0831   nicotine  polacrilex (NICORETTE ) gum 2 mg  2 mg Oral PRN Ronald [name], Ronald [name], Ronald [name]   2 mg at 08/19/23 1109    ondansetron  (ZOFRAN ) tablet 4 mg  4 mg Oral Q8H PRN Ronald [name], Ronald [name], Ronald [name]       traZODone  (DESYREL ) tablet 100 mg  100 mg Oral QHS Ronald [name], [name], [name]   100 mg at 08/18/23 2033    Lab Results:  Results for orders placed or performed during the hospital encounter of 08/16/23 (from the past 48 hours)  Folate     Status: None   Collection Time: 08/17/23  6:41 PM  Result Value Ref Range   Folate 14.9 >5.9 ng/mL    Comment: Performed at [facility], [address] [place], [name] [address]  RPR     Status: None   Collection Time: 08/17/23  6:41 PM  Result Value Ref Range   RPR Ser Ql NON REACTIVE NON REACTIVE    Comment: Performed at [facility], [address] [place], [name] [address]  Vitamin B12     Status: None   Collection Time: 08/17/23  6:41 PM  Result Value Ref Range   Vitamin B-12 806 180 - 914 pg/mL    Comment: (NOTE) This assay is not validated for testing neonatal or myeloproliferative syndrome specimens for Vitamin B12 levels. Performed at [facility], [address], [place], [name] [address]   VITAMIN D 25 Hydroxy (Vit-D Deficiency, Fractures)     Status: None   Collection Time: 08/17/23  6:41 PM  Result Value Ref Range   Vit D, 25-Hydroxy 97.20 30 - 100 ng/mL    Comment: (NOTE) Vitamin D deficiency has been defined by the Institute of Medicine  and an Endocrine Society practice guideline as [name] level of serum 25-OH  vitamin D less than 20 ng/mL (1,2). The Endocrine Society went on to  further define vitamin D insufficiency as [name] level between 21 and 29  ng/mL (2).  1. IOM (Institute of Medicine). 2010. Dietary reference intakes for  calcium and D. [place]: [employer]. 2. Holick MF, Binkley Dawson, Bischoff-Ferrari HA, et al. Evaluation,  treatment, and prevention of vitamin D deficiency: an Endocrine  Society clinical practice guideline, JCEM. 2011 Jul; 96(7): 1911-30.  Performed at  [facility], [address] [place], [name] [address]   Valproic acid  level     Status: Abnormal   Collection Time: 08/17/23  6:41 PM  Result Value Ref Range   Valproic Acid  Lvl 39 (L) 50 - 100 ug/mL    Comment: Performed at [facility], [address], [place], [name] [address]    Blood Alcohol level:  Lab Results  Component Value Date   [facility] <15 08/15/2023   ETH <10 05/17/2023    Metabolic Disorder Labs: Lab Results  Component Value Date   HGBA1C 4.9 05/17/2023   MPG 93.93 05/17/2023   MPG 93.93 09/29/2021   No results found for: "PROLACTIN" Lab Results  Component Value Date   CHOL 152 05/17/2023   TRIG 70 05/17/2023   HDL 78 05/17/2023   CHOLHDL 1.9 05/17/2023   VLDL 14 05/17/2023   LDLCALC 60 05/17/2023   LDLCALC 55 02/19/2020    Physical Findings: AIMS:  , ,  ,  ,    CIWA:    COWS:     Musculoskeletal: Strength & Muscle Tone: within normal limits Gait & Station: normal Patient leans: N/[name]  Psychiatric Specialty Exam:  Presentation  General Appearance:  Appropriate for Environment; Casual  Eye Contact: Fair  Speech: Clear and Coherent; Normal Rate  Speech Volume: Normal  Handedness: Right   Mood and Affect  Mood: Anxious  Affect: Constricted   Thought Process  Thought Processes: Linear  Descriptions of Associations:Loose  Orientation:Full (Time, Place and Person)  Thought Content:Rumination  History of Schizophrenia/Schizoaffective disorder:Yes  Duration of Psychotic Symptoms:Less than six months  Hallucinations:Hallucinations: None  Ideas of Reference:None  Suicidal Thoughts:Suicidal Thoughts: No  Homicidal Thoughts:Homicidal  Thoughts: No   Sensorium  Memory: Immediate Fair  Judgment: Intact  Insight: Present   Executive Functions  Concentration: Poor  Attention Span: Poor  Recall: [employer] of Knowledge: Fair  Language: Fair   Psychomotor Activity   Psychomotor Activity: Psychomotor Activity: Normal   Assets  Assets: Communication Skills; Physical Health; Resilience; Desire for Improvement   Sleep  Sleep: Sleep: Good Number of Hours of Sleep: 8    Physical Exam: Physical Exam Vitals and nursing note reviewed.  Constitutional:      General: He is not in acute distress.    Appearance: Normal appearance. He is normal weight. He is not ill-appearing or toxic-appearing.  HENT:     Head: Normocephalic and atraumatic.  Pulmonary:     Effort: Pulmonary effort is normal.  Musculoskeletal:        General: Normal range of motion.  Neurological:     General: No focal deficit present.     Mental Status: He is alert.    Review of Systems  Respiratory:  Negative for cough and shortness of breath.   Cardiovascular:  Negative for chest pain.  Gastrointestinal:  Negative for abdominal pain, constipation, diarrhea, nausea and vomiting.  Neurological:  Negative for dizziness, weakness and headaches.  Psychiatric/Behavioral:  Negative for depression, hallucinations and suicidal ideas. The patient is nervous/anxious.    Blood pressure 126/69, pulse (!) 56, temperature 97.8 F (36.6 C), temperature source Oral, resp. rate 18, height 5\' 11"  (1.803 m), weight 73.5 kg, SpO2 99%. Body mass index is 22.59 kg/m.   Treatment Plan Summary: Daily contact with patient to assess and evaluate symptoms and progress in treatment and Medication management  XUAN MENOR is [name] 43 yr old male who presented on 5/2 to [facility] under IVC due to bizarre, aggressive behavior and hallucinations, he was admitted to [facility] on 5/4.  PPHx is significant for Schizoaffective Disorder, Bipolar Type, GAD, PTSD, and Polysubstance Abuse (Benzo, THC), and 5 Prior Psychiatric Hospitalizations (last- [facility] 05/2023).    Kenlin continued to be irritable and intrusive yesterday requiring PRN Haldol  but did respond well to it.  Today he is more logical, less intrusive and is  agreeable to starting scheduled Haldol .  We will not make any other changes to his medications at this time.  We will continue to monitor.     Schizoaffective Disorder, Bipolar Type: -Continue Depakote  750 mg BID for mood stability -Start Haldol  5 mg BID for psychosis and mood stability -Continue Gabapentin  200 mg TID for mood stability -Continue Agitation Protocol: Haldol /Ativan    Nicotine  Dependence: -Continue Nicotine  Patch 14 mg daily -Continue Nicotine  Gum 2 mg PRN   -Continue Mag-Ox 400 mg QHS -Continue Multivitamin daily -Continue Trazodone  100 mg QHS -Continue PRN'[name]: Tylenol , Maalox, Milk of Magnesia   --  The risks/benefits/side-effects/alternatives to medications were discussed in detail with the patient and time was given for questions. The patient consents to medication trials.                -- Metabolic profile and EKG monitoring obtained while on an atypical antipsychotic (BMI: Lipid Panel: HbgA1c: QTc:)              -- Encouraged patient to participate in unit milieu and in scheduled group therapies     Safety and Monitoring:             -- Involuntary admission to inpatient psychiatric unit for safety, stabilization and treatment             --  Daily contact with patient to assess and evaluate symptoms and progress in treatment             -- Patient'[name] case to be discussed in multi-disciplinary team meeting             -- Observation Level : q15 minute checks             -- Vital signs:  q12 hours             -- Precautions: suicide, elopement, and assault  Discharge Planning:              -- Social work and case management to assist with discharge planning and identification of hospital follow-up needs prior to discharge             -- Estimated LOS: 3-5 more days             -- Discharge Concerns: Need to establish [name] safety plan; Medication compliance and effectiveness             -- Discharge Goals: Return home with outpatient referrals for mental health  follow-up including medication management/psychotherapy]  Basilia Bosworth, [name] 08/19/2023, 11:34 AM
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
## 2023-08-19 NOTE — Plan of Care (Signed)
   Problem: Education: Goal: Emotional status will improve Outcome: Not Progressing Goal: Mental status will improve Outcome: Not Progressing

## 2023-08-19 NOTE — Group Note (Signed)
 Recreation Therapy Group Note   Group Topic:Coping Skills  Group Date: 08/19/2023 Start Time: 1045 End Time: 1130 Facilitators: Sadiel Mota-McCall, LRT,CTRS Location: [address] Dayroom  Group Topic: Coping Skills    Goal Area(s) Addresses: Patient will define what a coping skill is. Patient will create a list of healthy coping skills beginning with each letter of the alphabet. Patient will successfully identify positive coping skills they can use post d/c.  Patient will acknowledge benefit(s) of using learned coping skills post d/c.   Intervention: Group work   Activity: Coping A to Z. Patient asked to identify what a coping skill is and when they use them. Patients with Clinical research associate discussed healthy versus unhealthy coping skills. Next patients were given a blank worksheet titled "Coping Skills A-Z". Partners were instructed to come up with at least one positive coping skill per letter of the alphabet. Patients were given 15 minutes to brainstorm before ideas were presented to the large group. Patients and LRT debriefed on the importance of coping skill selection based on situation and back-up plans when a skill tried is not effective. At the end of group, patients were given an handout of alphabetized strategies to keep for future reference.   Education: Pharmacologist, Scientist, physiological, Discharge Planning.    Education Outcome: Acknowledges education/Verbalizes understanding/In group clarification offered/Additional education needed   Affect/Mood: Appropriate   Participation Level: Engaged   Participation Quality: Independent   Behavior: Appropriate   Speech/Thought Process: Disorganized and Focused   Insight: Fair   Judgement: Fair    Modes of Intervention: Worksheet   Patient Response to Interventions:  Engaged   Education Outcome:  In group clarification offered    Clinical Observations/Individualized Feedback: Pt identified a coping skill as being artistic and not  getting into politics. Pt then started rambling about people being in politics being idiots. Pt also talked about how God doesn't want people to be focused on "stuff" and we should be outside in God's nature. Pt came up some coping skills such as internal reflections, obey your mother, be yourself, passionate tenderness, having the purest intent with the gentlest touch and believe in yourself. Pt was also attentive to peers and offered suggestions when peers were unable to complete some of the letters on the sheet.   Plan: Continue to engage patient in RT group sessions 2-3x/week.   Bevely Hackbart-McCall, LRT,CTRS 08/19/2023 1:37 PM

## 2023-08-19 NOTE — Progress Notes (Signed)
   08/19/23 1400  Psych Admission Type (Psych Patients Only)  Admission Status Involuntary  Psychosocial Assessment  Patient Complaints Anxiety  Eye Contact Intense  Facial Expression Anxious  Affect Labile  Speech Rapid  Interaction Needy;Demanding  Motor Activity Restless  Appearance/Hygiene Poor hygiene  Behavior Characteristics Cooperative  Mood Euthymic;Suspicious  Thought Process  Coherency WDL  Content Blaming others  Delusions None reported or observed  Perception WDL  Hallucination None reported or observed  Judgment Impaired  Confusion None  Danger to Self  Current suicidal ideation? Denies  Danger to Others  Danger to Others None reported or observed   Dar Note: Patient presents with anxious affect and mood.  Denies suicidal thoughts, auditory and visual hallucinations.  Medications given as prescribed.  Routine safety checks maintained.  Patient tearful during the day, preoccupied with his mother's illness.  Stated, "my mother is going to die."  Ativan  1 mg given for complain of anxiety with good effect.  Patient offered support and encouragement as needed.  Patient is safe on the unit.

## 2023-08-19 NOTE — Plan of Care (Signed)
   Problem: Education: Goal: Knowledge of  General Education information/materials will improve Outcome: Progressing Goal: Emotional status will improve Outcome: Progressing Goal: Mental status will improve Outcome: Progressing Goal: Verbalization of understanding the information provided will improve Outcome: Progressing   Problem: Coping: Goal: Ability to verbalize frustrations and anger appropriately will improve Outcome: Progressing Goal: Ability to demonstrate self-control will improve Outcome: Progressing

## 2023-08-19 NOTE — Progress Notes (Signed)
   08/19/23 2300  Psych Admission Type (Psych Patients Only)  Admission Status Involuntary  Psychosocial Assessment  Patient Complaints Anxiety  Eye Contact Fair  Facial Expression Anxious  Affect Labile  Speech Rapid  Interaction Needy  Motor Activity Restless  Appearance/Hygiene Poor hygiene  Behavior Characteristics Cooperative  Mood Euthymic;Suspicious  Thought Process  Coherency WDL  Content Blaming others  Delusions None reported or observed  Perception WDL  Hallucination None reported or observed  Judgment Impaired  Confusion None  Danger to Self  Current suicidal ideation? Denies  Agreement Not to Harm Self Yes  Description of Agreement Verbal contract  Danger to Others  Danger to Others None reported or observed

## 2023-08-20 NOTE — Progress Notes (Signed)
 [facility] MD Progress Note  08/20/2023 10:35 AM Ronald [name]  MRN:  [id number] Subjective:   Ronald [name] is a 43 yr old male who presented on 5/2 to [facility] under IVC due to bizarre, aggressive behavior and hallucinations, he was admitted to [facility] on 5/4.  PPHx is significant for Schizoaffective Disorder, Bipolar Type, GAD, PTSD, and Polysubstance Abuse (Benzo, THC), and 5 Prior Psychiatric Hospitalizations (last- [facility] 05/2023).  Case was discussed in the multidisciplinary team. MAR was reviewed and patient was compliant with medications.  He received agitation PRN Ativan  and PRN Tylenol  yesterday.   Psychiatric Team made the following recommendations yesterday: -Continue Depakote  750 mg BID for mood stability -Continue Gabapentin  200 mg TID for mood stability    On interview today patient reports he slept good last night.  He reports his appetite is doing good.  He reports no SI, HI, or AVH.  He reports no Paranoia or Ideas of Reference.  He reports no issues with his medications.  He reports that the Haldol  has helped with his anxiety and he feels better this morning.  He reports that the Haldol  has also helped with his sleep and appetite.  He reports no other concerns at present.   Update: Nursing later reported that patient refused morning medications stating he was having an "allergy" to Haldol  but did not say what.   Principal Problem: Schizoaffective disorder (HCC) Diagnosis: Principal Problem:   Schizoaffective disorder (HCC) Active Problems:   Substance induced mood disorder (HCC)  Total Time spent with patient:  I personally spent 35 minutes on the unit in direct patient care. The direct patient care time included face-to-face time with the patient, reviewing the patient's chart, communicating with other professionals, and coordinating care. Greater than 50% of this time was spent in counseling or coordinating care with the patient regarding goals of hospitalization,  psycho-education, and discharge planning needs.   Past Psychiatric History:  Schizoaffective Disorder, Bipolar Type, GAD, PTSD, and Polysubstance Abuse (Benzo, THC), and 5 Prior Psychiatric Hospitalizations (last- [facility] 05/2023).  Past Medical History:  Past Medical History:  Diagnosis Date   Anxiety    Arthritis    Disorder of pineal gland    Post traumatic stress disorder     Past Surgical History:  Procedure Laterality Date   ANKLE SURGERY     Family History: History reviewed. No pertinent family history. Family Psychiatric  History:  No Known  Social History:  Social History   Substance and Sexual Activity  Alcohol Use No     Social History   Substance and Sexual Activity  Drug Use Yes   Types: Marijuana   Comment: one year ago    Social History   Socioeconomic History   Marital status: Single    Spouse name: Not on file   Number of children: Not on file   Years of education: Not on file   Highest education level: Not on file  Occupational History   Not on file  Tobacco Use   Smoking status: Never   Smokeless tobacco: Current    Types: Chew  Vaping Use   Vaping status: Never Used  Substance and Sexual Activity   Alcohol use: No   Drug use: Yes    Types: Marijuana    Comment: one year ago   Sexual activity: Not Currently  Other Topics Concern   Not on file  Social History Narrative   Not on file   Social Drivers of Corporate investment banker  Strain: Not on file  Food Insecurity: Patient Declined (08/16/2023)   Hunger Vital Sign    Worried About Running Out of Food in the Last Year: Patient declined    Ran Out of Food in the Last Year: Patient declined  Transportation Needs: Patient Declined (08/16/2023)   PRAPARE - Administrator, Civil Service (Medical): Patient declined    Lack of Transportation (Non-Medical): Patient declined  Physical Activity: Not on file  Stress: Not on file  Social Connections: Not on file   Additional Social  History:                         Sleep: Good   Appetite:  Good   Current Medications: Current Facility-Administered Medications  Medication Dose Route Frequency Provider Last Rate Last Admin   acetaminophen  (TYLENOL ) tablet 650 mg  650 mg Oral Q4H PRN Mills, Shnese E, NP   650 mg at 08/19/23 2041   alum & mag hydroxide-simeth (MAALOX/MYLANTA) 200-200-20 MG/5ML suspension 30 mL  30 mL Oral Q6H PRN Mills, Shnese E, NP       divalproex  (DEPAKOTE  ER) 24 hr tablet 750 mg  750 mg Oral BID Parker, Alvin S, MD   750 mg at 08/20/23 1610   gabapentin  (NEURONTIN ) capsule 200 mg  200 mg Oral TID Mills, Shnese E, NP   200 mg at 08/20/23 9604   haloperidol  (HALDOL ) tablet 5 mg  5 mg Oral TID PRN Mills, Shnese E, NP   5 mg at 08/20/23 0745   haloperidol  (HALDOL ) tablet 5 mg  5 mg Oral BID Amous Crewe S, MD   5 mg at 08/19/23 2028   haloperidol  lactate (HALDOL ) injection 10 mg  10 mg Intramuscular TID PRN Mills, Shnese E, NP       haloperidol  lactate (HALDOL ) injection 5 mg  5 mg Intramuscular TID PRN Doneen Fuelling, NP       And   LORazepam  (ATIVAN ) injection 2 mg  2 mg Intramuscular TID PRN Mills, Shnese E, NP       LORazepam  (ATIVAN ) tablet 1 mg  1 mg Oral TID PRN Izediuno, Vincent A, MD   1 mg at 08/19/23 1536   magnesium  hydroxide (MILK OF MAGNESIA) suspension 30 mL  30 mL Oral Daily PRN Mills, Shnese E, NP       magnesium  oxide (MAG-OX) tablet 400 mg  400 mg Oral QHS [name], [name], NP   400 mg at 08/19/23 2028   multivitamin with minerals tablet 1 tablet  1 tablet Oral Daily Carrion-Carrero, [name], MD   1 tablet at 08/20/23 0743   nicotine  (NICODERM CQ  - dosed in mg/24 hours) patch 14 mg  14 mg Transdermal Daily Attiah, Nadir, MD   14 mg at 08/20/23 0745   nicotine  polacrilex (NICORETTE ) gum 2 mg  2 mg Oral PRN Mills, Shnese E, NP   2 mg at 08/20/23 5409   ondansetron  (ZOFRAN ) tablet 4 mg  4 mg Oral Q8H PRN Mills, Shnese E, NP       traZODone  (DESYREL ) tablet 100 mg  100 mg  Oral QHS Carrion-Carrero, [name], MD   100 mg at 08/19/23 2028    Lab Results:  No results found for this or any previous visit (from the past 48 hours).   Blood Alcohol level:  Lab Results  Component Value Date   [facility] <15 08/15/2023   ETH <10 05/17/2023    Metabolic Disorder Labs: Lab Results  Component  Value Date   HGBA1C 4.9 05/17/2023   MPG 93.93 05/17/2023   MPG 93.93 09/29/2021   No results found for: "PROLACTIN" Lab Results  Component Value Date   CHOL 152 05/17/2023   TRIG 70 05/17/2023   HDL 78 05/17/2023   CHOLHDL 1.9 05/17/2023   VLDL 14 05/17/2023   LDLCALC 60 05/17/2023   LDLCALC 55 02/19/2020    Physical Findings: AIMS:  , ,  ,  ,    CIWA:    COWS:     Musculoskeletal: Strength & Muscle Tone: within normal limits Gait & Station: normal Patient leans: N/A  Psychiatric Specialty Exam:  Presentation  General Appearance:  Appropriate for Environment; Casual  Eye Contact: Fair  Speech: Clear and Coherent; Normal Rate  Speech Volume: Normal  Handedness: Right   Mood and Affect  Mood: Anxious; Dysphoric  Affect: Flat   Thought Process  Thought Processes: Linear  Descriptions of Associations:Loose  Orientation:Full (Time, Place and Person)  Thought Content:Rumination  History of Schizophrenia/Schizoaffective disorder:Yes  Duration of Psychotic Symptoms:Less than six months  Hallucinations:Hallucinations: None  Ideas of Reference:None  Suicidal Thoughts:Suicidal Thoughts: No  Homicidal Thoughts:Homicidal Thoughts: No   Sensorium  Memory: Immediate Fair  Judgment: Intact  Insight: Present   Executive Functions  Concentration: Poor  Attention Span: Poor  Recall: [employer] of Knowledge: Fair  Language: Fair   Psychomotor Activity  Psychomotor Activity: Psychomotor Activity: Normal   Assets  Assets: Communication Skills; Desire for Improvement; Resilience   Sleep  Sleep: Sleep:  Good Number of Hours of Sleep: 8.25    Physical Exam: Physical Exam Vitals and nursing note reviewed.  Constitutional:      General: He is not in acute distress.    Appearance: Normal appearance. He is normal weight. He is not ill-appearing or toxic-appearing.  HENT:     Head: Normocephalic and atraumatic.  Pulmonary:     Effort: Pulmonary effort is normal.  Musculoskeletal:        General: Normal range of motion.  Neurological:     General: No focal deficit present.     Mental Status: He is alert.    Review of Systems  Respiratory:  Negative for cough and shortness of breath.   Cardiovascular:  Negative for chest pain.  Gastrointestinal:  Negative for abdominal pain, constipation, diarrhea, nausea and vomiting.  Neurological:  Negative for dizziness, weakness and headaches.  Psychiatric/Behavioral:  Positive for depression. Negative for hallucinations and suicidal ideas. The patient is nervous/anxious.    Blood pressure 127/83, pulse 75, temperature 98 F (36.7 C), temperature source Oral, resp. rate 18, height 5\' 11"  (1.803 m), weight 73.5 kg, SpO2 99%. Body mass index is 22.59 kg/m.   Treatment Plan Summary: Daily contact with patient to assess and evaluate symptoms and progress in treatment and Medication management  Ronald [name] is a 43 yr old male who presented on 5/2 to [facility] under IVC due to bizarre, aggressive behavior and hallucinations, he was admitted to [facility] on 5/4.  PPHx is significant for Schizoaffective Disorder, Bipolar Type, GAD, PTSD, and Polysubstance Abuse (Benzo, THC), and 5 Prior Psychiatric Hospitalizations (last- [facility] 05/2023).    Zohar darted Haldol  yesterday and seemed to be responding well to it.  When talking with him this morning he reported no side effects but then later when offered his morning dose claimed side effects and refused.  We will continue to offer his medications to him today, however, if he continues to refuse the Haldol  then  tomorrow we will ask for second opinion as without the antipsychotic he is irritable, intrusive, and delusional.  We will not make any changes to his medications at this time.  We will continue to monitor.   Schizoaffective Disorder, Bipolar Type: -Continue Depakote  750 mg BID for mood stability -Start Haldol  5 mg BID for psychosis and mood stability -Continue Gabapentin  200 mg TID for mood stability -Continue Agitation Protocol: Haldol /Ativan    Nicotine  Dependence: -Continue Nicotine  Patch 14 mg daily -Continue Nicotine  Gum 2 mg PRN   -Continue Mag-Ox 400 mg QHS -Continue Multivitamin daily -Continue Trazodone  100 mg QHS -Continue PRN's: Tylenol , Maalox, Milk of Magnesia   --  The risks/benefits/side-effects/alternatives to medications were discussed in detail with the patient and time was given for questions. The patient consents to medication trials.                -- Metabolic profile and EKG monitoring obtained while on an atypical antipsychotic (BMI: Lipid Panel: HbgA1c: QTc:)              -- Encouraged patient to participate in unit milieu and in scheduled group therapies     Safety and Monitoring:             -- Involuntary admission to inpatient psychiatric unit for safety, stabilization and treatment             -- Daily contact with patient to assess and evaluate symptoms and progress in treatment             -- Patient's case to be discussed in multi-disciplinary team meeting             -- Observation Level : q15 minute checks             -- Vital signs:  q12 hours             -- Precautions: suicide, elopement, and assault  Discharge Planning:              -- Social work and case management to assist with discharge planning and identification of hospital follow-up needs prior to discharge             -- Estimated LOS: 3-5 more days             -- Discharge Concerns: Need to establish a safety plan; Medication compliance and effectiveness             -- Discharge  Goals: Return home with outpatient referrals for mental health follow-up including medication management/psychotherapy]  Basilia Bosworth, MD 08/20/2023, 10:35 AM

## 2023-08-20 NOTE — Telephone Encounter (Signed)
 FYI. Patient has been discharged since the message.

## 2023-08-20 NOTE — Plan of Care (Signed)
   Problem: Education: Goal: Emotional status will improve Outcome: Progressing   Problem: Activity: Goal: Interest or engagement in activities will improve Outcome: Progressing

## 2023-08-20 NOTE — [facility] Group Notes (Signed)
 Adult Psychoeducational Group Note  Date:  08/20/2023 Time:  8:39 PM  Group Topic/Focus:  Wrap-Up Group:   The focus of this group is to help patients review their daily goal of treatment and discuss progress on daily workbooks.  Participation Level:  Active  Participation Quality:  Appropriate and Redirectable  Affect:  Angry, Appropriate, and Tearful  Cognitive:  Appropriate  Insight: Appropriate  Engagement in Group:  Engaged  Modes of Intervention:  Discussion and Support  Additional Comments:  Pt told that today was a difficult day on the unit due to Pt feeling like he was on the wrong hall. He angrily shared his viewpoint that he was "too educated to be on this hallway," before tearfully sharing fears about his mother passing away from cancer. The Writer explained that a 500 hallway assignment was neither a punishment nor reflective of a Pt's educational background. The Writer also sympathized with the Pt over concerns for his mother's health. Pt's peers also responded with support. Pt rated his day a 5 out of 10.  Dal Dubin 08/20/2023, 8:39 PM

## 2023-08-20 NOTE — Progress Notes (Signed)
   08/20/23 0800  Psych Admission Type (Psych Patients Only)  Admission Status Involuntary  Psychosocial Assessment  Patient Complaints Anxiety  Eye Contact Fair  Facial Expression Anxious  Affect Labile  Speech Rapid  Interaction Defensive;Needy  Motor Activity Restless  Appearance/Hygiene Poor hygiene  Behavior Characteristics Cooperative;Appropriate to situation  Mood Preoccupied  Thought Process  Coherency WDL  Content Blaming others  Delusions None reported or observed  Perception WDL  Hallucination None reported or observed  Judgment Impaired  Confusion None  Danger to Self  Current suicidal ideation? Denies  Agreement Not to Harm Self Yes  Description of Agreement Verbal contracts for safety  Danger to Others  Danger to Others None reported or observed

## 2023-08-20 NOTE — Plan of Care (Signed)
   Problem: Activity: Goal: Interest or engagement in activities will improve Outcome: Progressing   Problem: Coping: Goal: Ability to verbalize frustrations and anger appropriately will improve Outcome: Progressing   Problem: Health Behavior/Discharge Planning: Goal: Identification of resources available to assist in meeting health care needs will improve Outcome: Progressing

## 2023-08-20 NOTE — [facility] Group Notes (Signed)
 Adult Psychoeducational Group Note  Date:  08/20/2023 Time:  5:39 PM  Group Topic/Focus:  Goals Group:   The focus of this group is to help patients establish daily goals to achieve during treatment and discuss how the patient can incorporate goal setting into their daily lives to aide in recovery. Orientation:   The focus of this group is to educate the patient on the purpose and policies of crisis stabilization and provide a format to answer questions about their admission.  The group details unit policies and expectations of patients while admitted.  Participation Level:  Active  Participation Quality:  Appropriate  Affect:  Appropriate  Cognitive:  Appropriate  Insight: Appropriate  Engagement in Group:  Engaged  Modes of Intervention:  Discussion  Additional Comments:  Pt attended the goals group and remained appropriate and engaged throughout the duration of the group.   Ronald [name] 08/20/2023, 5:39 PM

## 2023-08-20 NOTE — Group Note (Signed)
 Recreation Therapy Group Note   Group Topic:Health and Wellness  Group Date: 08/20/2023 Start Time: 1009 End Time: 1032 Facilitators: Enedina Pair-McCall, LRT,CTRS Location: 500 Hall Dayroom   Group Topic: Exercise/Wellness  Goal Area(s) Addresses:  Patient will verbalize benefit of exercise during group session. Patient will identify an exercise that can be completed post d/c. Patient will acknowledge benefits of exercise when used as a coping mechanism.   Behavioral Response: Engaged  Intervention: Music  Activity: Exercise. LRT and patients discussed the importance of good physical health and how it pairs with mental and spiritual health. Patients and LRT took turn leading the group in the exercises of their choosing. The group aimed to complete at least 20 minutes of exercise. Patients could take breaks and get water  as needed.  Education: Physical Activity, Health and Wellness  Education Outcome: Acknowledges understanding/In group clarification offered/Needs additional education.    Affect/Mood: Appropriate   Participation Level: Engaged   Participation Quality: Independent   Behavior: Appropriate   Speech/Thought Process: Focused   Insight: Good   Judgement: Good   Modes of Intervention: Music and Exercise   Patient Response to Interventions:  Engaged   Education Outcome:  In group clarification offered    Clinical Observations/Individualized Feedback: Pt was focused and engaged during group. Pt was also social and interacted well with peers. Pt led group in some stretches and was able to complete exercises presented by peers.     Plan: Continue to engage patient in RT group sessions 2-3x/week.   Angelica Frandsen-McCall, LRT,CTRS 08/20/2023 11:04 AM

## 2023-08-21 MED ORDER — HALOPERIDOL LACTATE 5 MG/ML IJ SOLN
5.0000 mg | Freq: Two times a day (BID) | INTRAMUSCULAR | Status: DC
  Administered 2023-08-21 – 2023-08-22 (×2): 5 mg via INTRAMUSCULAR
  Filled 2023-08-21 (×5): qty 1

## 2023-08-21 MED ORDER — HALOPERIDOL 5 MG PO TABS
5.0000 mg | ORAL_TABLET | Freq: Two times a day (BID) | ORAL | Status: DC
  Administered 2023-08-21: 5 mg via ORAL
  Filled 2023-08-21 (×5): qty 1

## 2023-08-21 NOTE — Progress Notes (Signed)
   08/21/23 0300  Psych Admission Type (Psych Patients Only)  Admission Status Involuntary  Psychosocial Assessment  Patient Complaints Anxiety  Eye Contact Fair  Facial Expression Anxious  Affect Labile  Speech Rapid  Interaction Defensive;Needy  Motor Activity Restless  Appearance/Hygiene Poor hygiene  Behavior Characteristics Cooperative;Appropriate to situation  Mood Preoccupied  Thought Process  Coherency WDL  Content Blaming others  Delusions None reported or observed  Perception WDL  Hallucination None reported or observed  Judgment Impaired  Confusion None  Danger to Self  Current suicidal ideation? Denies  Agreement Not to Harm Self Yes  Description of Agreement verbal  Danger to Others  Danger to Others None reported or observed

## 2023-08-21 NOTE — Progress Notes (Signed)
 Patient came to the nurse station yelling and demanding to get ativan  since he was having an anxiety attack. Patient refused his scheduled Haldol  but received ativa. Will continue to monitor and maintain safety.

## 2023-08-21 NOTE — Progress Notes (Signed)
 1215. Patient initially refused ordered haloperidol  lactate. Patient was informed that the doctors had ordered forced medication. Then, patient agreed to take P.O. patient attempted to cheek the medication, then when asked to show the inside of bottom lip became angry, stormed away and spit the tablet out onto the floor. Patient received agitation protocol IM haloperidol  lactate 5 mg and lorazepam  2 mg for moderate agitation in addition to the scheduled 5 mg haloperidol  because patient was shouting at and attempting to intimidate staff, and was not verbally redirectable. Patient received lunch and returned to his room.

## 2023-08-21 NOTE — Progress Notes (Signed)
 Pt's mood remains labile majority of shift with intermittent verbal outburst that's abusive, demeaning and derogatory in nature; stepping in staff face when redirected about his phone conversation. Pt was overhead on phone, yelling and cursing in conversation with mom "I want to fucking kill myself that's what y'all fucking Haldol  is making me feel. 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 I had that prescribed to me in law school". He jumped into nurse's face "I know you take Adderall. All y'all nurses and doctors take Ativans, Adderalls and act like you're gods. I'm a different person with a different body and I need to leave here. Don't fucking give me no damn Haldol  no more". Verbal redirections ineffective as he continues to escalate, redirected to his room. Support, encouragement and reassurance offered. Safety maintained.

## 2023-08-21 NOTE — Progress Notes (Signed)
 [facility] MD Progress Note  08/21/2023 10:55 AM Ronald [name]  MRN:  [id number] Subjective:   Ronald [name] is a 43 yr old male who presented on 5/2 to [facility] under IVC due to bizarre, aggressive behavior and hallucinations, he was admitted to [facility] on 5/4.  PPHx is significant for Schizoaffective Disorder, Bipolar Type, GAD, PTSD, and Polysubstance Abuse (Benzo, THC), and 5 Prior Psychiatric Hospitalizations (last- [facility] 05/2023).  Case was discussed in the multidisciplinary team. MAR was reviewed and patient was compliant with medications.  He received agitation PRN Ativan  and PRN Tylenol  yesterday.   Psychiatric Team made the following recommendations yesterday: -Continue Depakote  750 mg BID for mood stability -Continue Gabapentin  200 mg TID for mood stability -Continue Haldol  5 mg BID for psychosis and mood stability   On interview today patient reports he slept good last night.  He reports his appetite is doing good.  He reports no SI, HI, or AVH.  He reports he does not any medications other than his Depakote  and Gabapentin .  He reports issues with his medications.  He reports that the Haldol  was giving him anxiety.  He reports that he does not need it.  He reports that he studied neuroscience while in [employer] school.  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  He reports that the Pineal gland was studied in [place] and that [employer] has worked on Programmer, applications.  He reports no other concerns at present.    Principal Problem: Schizoaffective disorder (HCC) Diagnosis: Principal Problem:   Schizoaffective disorder (HCC) Active Problems:   Substance induced mood disorder (HCC)  Total Time spent with patient:  I personally spent 35 minutes on the unit in direct patient care. The direct patient care time included face-to-face time with the patient, reviewing the patient's chart, communicating with other professionals, and coordinating care. Greater  than 50% of this time was spent in counseling or coordinating care with the patient regarding goals of hospitalization, psycho-education, and discharge planning needs.   Past Psychiatric History:  Schizoaffective Disorder, Bipolar Type, GAD, PTSD, and Polysubstance Abuse (Benzo, THC), and 5 Prior Psychiatric Hospitalizations (last- [facility] 05/2023).  Past Medical History:  Past Medical History:  Diagnosis Date   Anxiety    Arthritis    Disorder of pineal gland    Post traumatic stress disorder     Past Surgical History:  Procedure Laterality Date   ANKLE SURGERY     Family History: History reviewed. No pertinent family history. Family Psychiatric  History:  No Known  Social History:  Social History   Substance and Sexual Activity  Alcohol Use No     Social History   Substance and Sexual Activity  Drug Use Yes   Types: Marijuana   Comment: one year ago    Social History   Socioeconomic History   Marital status: Single    Spouse name: Not on file   Number of children: Not on file   Years of education: Not on file   Highest education level: Not on file  Occupational History   Not on file  Tobacco Use   Smoking status: Never   Smokeless tobacco: Current    Types: Chew  Vaping Use   Vaping status: Never Used  Substance and Sexual Activity   Alcohol use: No   Drug use: Yes    Types: Marijuana    Comment: one year ago   Sexual activity: Not Currently  Other Topics  Concern   Not on file  Social History Narrative   Not on file   Social Drivers of Health   Financial Resource Strain: Not on file  Food Insecurity: Patient Declined (08/16/2023)   Hunger Vital Sign    Worried About Running Out of Food in the Last Year: Patient declined    Ran Out of Food in the Last Year: Patient declined  Transportation Needs: Patient Declined (08/16/2023)   PRAPARE - Administrator, Civil Service (Medical): Patient declined    Lack of Transportation (Non-Medical): Patient  declined  Physical Activity: Not on file  Stress: Not on file  Social Connections: Not on file   Additional Social History:                         Sleep: Good   Appetite:  Good   Current Medications: Current Facility-Administered Medications  Medication Dose Route Frequency Provider Last Rate Last Admin   acetaminophen  (TYLENOL ) tablet 650 mg  650 mg Oral Q4H PRN Mills, Shnese E, NP   650 mg at 08/19/23 2041   alum & mag hydroxide-simeth (MAALOX/MYLANTA) 200-200-20 MG/5ML suspension 30 mL  30 mL Oral Q6H PRN Mills, Shnese E, NP       divalproex  (DEPAKOTE  ER) 24 hr tablet 750 mg  750 mg Oral BID Parker, Alvin S, MD   750 mg at 08/21/23 0865   gabapentin  (NEURONTIN ) capsule 200 mg  200 mg Oral TID Mills, Shnese E, NP   200 mg at 08/21/23 0803   haloperidol  (HALDOL ) tablet 5 mg  5 mg Oral TID PRN Mills, Shnese E, NP   5 mg at 08/20/23 0745   haloperidol  (HALDOL ) tablet 5 mg  5 mg Oral BID Adrionna Delcid S, MD   5 mg at 08/21/23 7846   haloperidol  lactate (HALDOL ) injection 10 mg  10 mg Intramuscular TID PRN Mills, Shnese E, NP       haloperidol  lactate (HALDOL ) injection 5 mg  5 mg Intramuscular TID PRN Doneen Fuelling, NP       And   LORazepam  (ATIVAN ) injection 2 mg  2 mg Intramuscular TID PRN Mills, Shnese E, NP       LORazepam  (ATIVAN ) tablet 1 mg  1 mg Oral TID PRN Izediuno, Vincent A, MD   1 mg at 08/20/23 2019   magnesium  hydroxide (MILK OF MAGNESIA) suspension 30 mL  30 mL Oral Daily PRN Mills, Shnese E, NP       magnesium  oxide (MAG-OX) tablet 400 mg  400 mg Oral QHS [name], [name], NP   400 mg at 08/20/23 2020   multivitamin with minerals tablet 1 tablet  1 tablet Oral Daily Carrion-Carrero, [name], MD   1 tablet at 08/21/23 0803   nicotine  (NICODERM CQ  - dosed in mg/24 hours) patch 14 mg  14 mg Transdermal Daily Attiah, Nadir, MD   14 mg at 08/21/23 0803   nicotine  polacrilex (NICORETTE ) gum 2 mg  2 mg Oral PRN Mills, Shnese E, NP   2 mg at 08/21/23 9629    ondansetron  (ZOFRAN ) tablet 4 mg  4 mg Oral Q8H PRN Mills, Shnese E, NP       traZODone  (DESYREL ) tablet 100 mg  100 mg Oral QHS Carrion-Carrero, [name], MD   100 mg at 08/20/23 2020    Lab Results:  No results found for this or any previous visit (from the past 48 hours).   Blood Alcohol level:  Lab  Results  Component Value Date   [facility] <15 08/15/2023   ETH <10 05/17/2023    Metabolic Disorder Labs: Lab Results  Component Value Date   HGBA1C 4.9 05/17/2023   MPG 93.93 05/17/2023   MPG 93.93 09/29/2021   No results found for: "PROLACTIN" Lab Results  Component Value Date   CHOL 152 05/17/2023   TRIG 70 05/17/2023   HDL 78 05/17/2023   CHOLHDL 1.9 05/17/2023   VLDL 14 05/17/2023   LDLCALC 60 05/17/2023   LDLCALC 55 02/19/2020    Physical Findings: AIMS:  , ,  ,  ,    CIWA:    COWS:     Musculoskeletal: Strength & Muscle Tone: within normal limits Gait & Station: normal Patient leans: N/A  Psychiatric Specialty Exam:  Presentation  General Appearance:  Appropriate for Environment; Casual  Eye Contact: Fair (minimal blinking)  Speech: Clear and Coherent (rapid)  Speech Volume: Increased  Handedness: Right   Mood and Affect  Mood: Anxious  Affect: Labile; Inappropriate   Thought Process  Thought Processes: Irrevelant; Disorganized  Descriptions of Associations:Tangential  Orientation:Full (Time, Place and Person)  Thought Content:Delusions; Obsessions  History of Schizophrenia/Schizoaffective disorder:No  Duration of Psychotic Symptoms:Less than six months  Hallucinations:Hallucinations: None  Ideas of Reference:Delusions  Suicidal Thoughts:Suicidal Thoughts: No  Homicidal Thoughts:Homicidal Thoughts: No   Sensorium  Memory: Immediate Fair  Judgment: Impaired  Insight: Lacking   Executive Functions  Concentration: Poor  Attention Span: Poor  Recall: [employer] of  Knowledge: Fair  Language: Fair   Psychomotor Activity  Psychomotor Activity: Psychomotor Activity: Normal   Assets  Assets: Communication Skills; Desire for Improvement; Resilience; Physical Health   Sleep  Sleep: Sleep: Good Number of Hours of Sleep: 8.5    Physical Exam: Physical Exam Vitals and nursing note reviewed.  Constitutional:      General: He is not in acute distress.    Appearance: Normal appearance. He is normal weight. He is not ill-appearing or toxic-appearing.  HENT:     Head: Normocephalic and atraumatic.  Pulmonary:     Effort: Pulmonary effort is normal.  Musculoskeletal:        General: Normal range of motion.  Neurological:     General: No focal deficit present.     Mental Status: He is alert.    Review of Systems  Respiratory:  Negative for cough and shortness of breath.   Cardiovascular:  Negative for chest pain.  Gastrointestinal:  Negative for abdominal pain, constipation, diarrhea, nausea and vomiting.  Neurological:  Negative for dizziness, weakness and headaches.  Psychiatric/Behavioral:  Negative for depression, hallucinations and suicidal ideas. The patient is not nervous/anxious.    Blood pressure 121/72, pulse 68, temperature 98 F (36.7 C), temperature source Oral, resp. rate 18, height 5\' 11"  (1.803 m), weight 73.5 kg, SpO2 99%. Body mass index is 22.59 kg/m.   Treatment Plan Summary: Daily contact with patient to assess and evaluate symptoms and progress in treatment and Medication management  Ronald [name] is a 43 yr old male who presented on 5/2 to [facility] under IVC due to bizarre, aggressive behavior and hallucinations, he was admitted to [facility] on 5/4.  PPHx is significant for Schizoaffective Disorder, Bipolar Type, GAD, PTSD, and Polysubstance Abuse (Benzo, THC), and 5 Prior Psychiatric Hospitalizations (last- [facility] 05/2023).    Brooke has continued to refuse his Haldol .  Given his significant worsening of his delusions and  returning irritability and intrusiveness we will ask Dr. Zouve for a  Second Opinion for Non Emergent Medications Against Objection.  We will change his Haldol  to PO or IM.  We will not make any other changes to his medications at this time.  We will continue to monitor.    Schizoaffective Disorder, Bipolar Type: -Continue Depakote  750 mg BID for mood stability -Continue Haldol  5 mg BID PO or IM for psychosis and mood stability -Non Emergent Medications Against Objection Order placed by Dr. Zouve on 5/8 -Continue Gabapentin  200 mg TID for mood stability -Continue Agitation Protocol: Haldol /Ativan    Nicotine  Dependence: -Continue Nicotine  Patch 14 mg daily -Continue Nicotine  Gum 2 mg PRN   -Continue Mag-Ox 400 mg QHS -Continue Multivitamin daily -Continue Trazodone  100 mg QHS -Continue PRN's: Tylenol , Maalox, Milk of Magnesia   --  The risks/benefits/side-effects/alternatives to medications were discussed in detail with the patient and time was given for questions. The patient consents to medication trials.                -- Metabolic profile and EKG monitoring obtained while on an atypical antipsychotic (BMI: Lipid Panel: HbgA1c: QTc:)              -- Encouraged patient to participate in unit milieu and in scheduled group therapies     Safety and Monitoring:             -- Involuntary admission to inpatient psychiatric unit for safety, stabilization and treatment             -- Daily contact with patient to assess and evaluate symptoms and progress in treatment             -- Patient's case to be discussed in multi-disciplinary team meeting             -- Observation Level : q15 minute checks             -- Vital signs:  q12 hours             -- Precautions: suicide, elopement, and assault  Discharge Planning:              -- Social work and case management to assist with discharge planning and identification of hospital follow-up needs prior to discharge             --  Estimated LOS: 3-5 more days             -- Discharge Concerns: Need to establish a safety plan; Medication compliance and effectiveness             -- Discharge Goals: Return home with outpatient referrals for mental health follow-up including medication management/psychotherapy]  Basilia Bosworth, MD 08/21/2023, 10:55 AM

## 2023-08-21 NOTE — Group Note (Signed)
 Date:  08/21/2023 Time:  8:43 PM  Group Topic/Focus:  Wrap-Up Group:   The focus of this group is to help patients review their daily goal of treatment and discuss progress on daily workbooks.    Additional Comments:  Pt was encouraged, but opted out of attending wrap up group this evening.   Eligah Grow 08/21/2023, 8:43 PM

## 2023-08-21 NOTE — Progress Notes (Signed)
 [facility] Second Physician Opinion Progress Note for Medication Administration to Non-consenting Patients (For Involuntarily Committed Patients)  Patient: ASCHER BERTRAND Date of Birth: [birth year] MRN: [id number]  Reason for the Medication: The patient, without the benefit of the specific treatment measure, is incapable of participating in any available treatment plan that will give the patient a realistic opportunity of improving the patient's condition.  Consideration of Side Effects: Consideration of the side effects related to the medication plan has been given.  Rationale for Medication Administration: Patient is floridly psychotic with grandiose and paranoid persecutory delusion. Patient intermittently agitated and has been refusing all antipsychotic treatment for >72 hours. Poor insight and judgement and patient will not improve without forced antipsychotic medication.    Elfreda Blanchet, MD 08/21/23  10:57 AM   This documentation is good for (7) seven days from the date of the MD signature. New documentation must be completed every seven (7) days with detailed justification in the medical record if the patient requires continued non-emergent administration of psychotropic medications.

## 2023-08-21 NOTE — [facility] Group Notes (Signed)
 Adult Psychoeducational Group Note  Date:  08/21/2023 Time:  6:25 PM  Group Topic/Focus:  Goals Group:   The focus of this group is to help patients establish daily goals to achieve during treatment and discuss how the patient can incorporate goal setting into their daily lives to aide in recovery. Orientation:   The focus of this group is to educate the patient on the purpose and policies of crisis stabilization and provide a format to answer questions about their admission.  The group details unit policies and expectations of patients while admitted.  Participation Level:  Active  Participation Quality:  Appropriate  Affect:  Appropriate  Cognitive:  Appropriate  Insight: Appropriate  Engagement in Group:  Engaged  Modes of Intervention:  Discussion  Additional Comments:  Pt attended the goals group and remained appropriate and engaged throughout the duration of the group.   Rissie Sculley O 08/21/2023, 6:25 PM

## 2023-08-21 NOTE — Plan of Care (Signed)
  Problem: Coping: Goal: Ability to verbalize frustrations and anger appropriately will improve Outcome: Progressing Goal: Ability to demonstrate self-control will improve Outcome: Progressing   Problem: Health Behavior/Discharge Planning: Goal: Identification of resources available to assist in meeting health care needs will improve Outcome: Progressing Goal: Compliance with treatment plan for underlying cause of condition will improve Outcome: Progressing

## 2023-08-21 NOTE — Plan of Care (Signed)
  Problem: Education: Goal: Emotional status will improve Outcome: Not Progressing   Problem: Coping: Goal: Ability to verbalize frustrations and anger appropriately will improve Outcome: Not Progressing Goal: Ability to demonstrate self-control will improve Outcome: Not Progressing   Problem: Health Behavior/Discharge Planning: Goal: Compliance with treatment plan for underlying cause of condition will improve Outcome: Not Progressing

## 2023-08-22 ENCOUNTER — Encounter (HOSPITAL_COMMUNITY): Payer: Self-pay

## 2023-08-22 MED ORDER — HALOPERIDOL 5 MG PO TABS
10.0000 mg | ORAL_TABLET | Freq: Two times a day (BID) | ORAL | Status: DC
  Administered 2023-08-24: 10 mg via ORAL
  Filled 2023-08-22 (×8): qty 2

## 2023-08-22 MED ORDER — DIVALPROEX SODIUM ER 500 MG PO TB24
2000.0000 mg | ORAL_TABLET | Freq: Every day | ORAL | Status: DC
  Filled 2023-08-22 (×2): qty 4

## 2023-08-22 MED ORDER — HALOPERIDOL LACTATE 5 MG/ML IJ SOLN
5.0000 mg | Freq: Two times a day (BID) | INTRAMUSCULAR | Status: DC
  Administered 2023-08-22 – 2023-08-24 (×4): 5 mg via INTRAMUSCULAR
  Filled 2023-08-22 (×9): qty 1

## 2023-08-22 NOTE — Progress Notes (Signed)
   08/22/23 0545  Psychosocial Assessment  Patient Complaints Irritability;Other (Comment) (Pt stated, "I want to know all the side effects of haldol . I didn't give informed consent. You'll be hearing from my lawyer." Pt redirected self to room.)  Eye Contact Intense;Glaring  Facial Expression Animated  Interaction Demanding;Other (Comment) (Grandiose)  Thought Process  Delusions Grandeur

## 2023-08-22 NOTE — Group Note (Signed)
 Date:  08/22/2023 Time:  8:28 PM  Group Topic/Focus:  Wrap-Up Group:   The focus of this group is to help patients review their daily goal of treatment and discuss progress on daily workbooks.    Participation Level:  Active  Participation Quality:  Appropriate  Affect:  Appropriate  Cognitive:  Appropriate  Insight: Appropriate  Engagement in Group:  Engaged  Modes of Intervention:  Education and Exploration  Additional Comments:  Patient attended and participated in group tonight  He reports that his goal today was tp lee[ anxiety low.  He had no anxiety today He accomplished his goal with the help of medication.  Ronald [name] 08/22/2023, 8:28 PM

## 2023-08-22 NOTE — Plan of Care (Signed)
   Problem: Education: Goal: Knowledge of [facility] Education information/materials will improve Outcome: Progressing Goal: Emotional status will improve Outcome: Progressing Goal: Mental status will improve Outcome: Progressing Goal: Verbalization of understanding the information provided will improve Outcome: Progressing   Problem: Safety: Goal: Periods of time without injury will increase Outcome: Progressing

## 2023-08-22 NOTE — BH IP Treatment Plan (Signed)
 Interdisciplinary Treatment and Diagnostic Plan Update  08/22/2023 Time of Session: THIS IS AN UPDATE Ronald [name] MRN: [id number]  Principal Diagnosis: Schizoaffective disorder [facility])  Secondary Diagnoses: Principal Problem:   Schizoaffective disorder (HCC) Active Problems:   Substance induced mood disorder (HCC)   Current Medications:  Current Facility-Administered Medications  Medication Dose Route Frequency Provider Last Rate Last Admin   acetaminophen  (TYLENOL ) tablet 650 mg  650 mg Oral Q4H PRN Mills, Shnese E, NP   650 mg at 08/19/23 2041   alum & mag hydroxide-simeth (MAALOX/MYLANTA) 200-200-20 MG/5ML suspension 30 mL  30 mL Oral Q6H PRN Mills, Shnese E, NP       divalproex  (DEPAKOTE  ER) 24 hr tablet 750 mg  750 mg Oral BID Parker, Alvin S, MD   750 mg at 08/22/23 0831   gabapentin  (NEURONTIN ) capsule 200 mg  200 mg Oral TID Mills, Shnese E, NP   200 mg at 08/22/23 1235   haloperidol  (HALDOL ) tablet 10 mg  10 mg Oral BID Izediuno, Iline Mallory, MD       Or   haloperidol  lactate (HALDOL ) injection 5 mg  5 mg Intramuscular BID Izediuno, Iline Mallory, MD       haloperidol  (HALDOL ) tablet 5 mg  5 mg Oral TID PRN Mills, Shnese E, NP   5 mg at 08/20/23 0745   haloperidol  lactate (HALDOL ) injection 10 mg  10 mg Intramuscular TID PRN Mills, Shnese E, NP       haloperidol  lactate (HALDOL ) injection 5 mg  5 mg Intramuscular TID PRN Mills, Shnese E, NP   5 mg at 08/21/23 1215   And   LORazepam  (ATIVAN ) injection 2 mg  2 mg Intramuscular TID PRN Mills, Shnese E, NP   2 mg at 08/21/23 1216   LORazepam  (ATIVAN ) tablet 1 mg  1 mg Oral TID PRN Izediuno, Vincent A, MD   1 mg at 08/20/23 2019   magnesium  hydroxide (MILK OF MAGNESIA) suspension 30 mL  30 mL Oral Daily PRN Mills, Shnese E, NP       magnesium  oxide (MAG-OX) tablet 400 mg  400 mg Oral QHS [name], [name], NP   400 mg at 08/21/23 2138   multivitamin with minerals tablet 1 tablet  1 tablet Oral Daily Carrion-Carrero, [name], MD   1  tablet at 08/22/23 4098   nicotine  (NICODERM CQ  - dosed in mg/24 hours) patch 14 mg  14 mg Transdermal Daily Attiah, Nadir, MD   14 mg at 08/22/23 0831   nicotine  polacrilex (NICORETTE ) gum 2 mg  2 mg Oral PRN Mills, Shnese E, NP   2 mg at 08/22/23 1236   ondansetron  (ZOFRAN ) tablet 4 mg  4 mg Oral Q8H PRN Mills, Shnese E, NP       traZODone  (DESYREL ) tablet 100 mg  100 mg Oral QHS Carrion-Carrero, [name], MD   100 mg at 08/21/23 2138   PTA Medications: Medications Prior to Admission  Medication Sig Dispense Refill Last Dose/Taking   divalproex  (DEPAKOTE  ER) 500 MG 24 hr tablet Take 1 tablet (500 mg total) by mouth 2 (two) times daily. 60 tablet 3    gabapentin  (NEURONTIN ) 100 MG capsule Take 2 capsules (200 mg total) by mouth 3 (three) times daily. 180 capsule 3    Ibuprofen  200 MG CAPS Take 800 mg by mouth every 6 (six) hours as needed (for headaches or pain).      LORazepam  (ATIVAN ) 1 MG tablet Take 1 mg by mouth every 8 (eight) hours as  needed for anxiety.      MAGNESIUM  OXIDE, ELEMENTAL, PO Take 500 mg by mouth at bedtime.      Melatonin 10 MG TABS Take 10 mg by mouth at bedtime. 30 tablet 3    Multiple Vitamin (MULTIVITAMIN WITH MINERALS) TABS tablet Take 1 tablet by mouth daily with breakfast.      nicotine  (NICODERM CQ  - DOSED IN MG/24 HOURS) 14 mg/24hr patch Place 1 patch (14 mg total) onto the skin daily. (Patient not taking: Reported on 08/15/2023) 28 patch 0    nicotine  (NICODERM CQ  - DOSED IN MG/24 HOURS) 21 mg/24hr patch Place 21 mg onto the skin daily.      nicotine  polacrilex (NICORETTE ) 2 MG gum Take 1 each (2 mg total) by mouth every 4 (four) hours as needed for smoking cessation (while awake). (Patient taking differently: Take 2 mg by mouth every 2 (two) hours as needed for smoking cessation (while awake).) 100 tablet 0    Nutritional Supplements (ENSURE ORIGINAL) LIQD Take 237 mLs by mouth daily.      QUEtiapine  (SEROQUEL ) 50 MG tablet Take 1 tablet (50 mg total) by mouth at  bedtime. (Patient not taking: Reported on 08/15/2023) 7 tablet 0    traZODone  (DESYREL ) 50 MG tablet Take 1 tablet (50 mg total) by mouth at bedtime. 30 tablet 3     Patient Stressors: Financial difficulties   Medication change or noncompliance    Patient Strengths: Ability for Contractor for treatment/growth  Supportive family/friends   Treatment Modalities: Medication Management, Group therapy, Case management,  1 to 1 session with clinician, Psychoeducation, Recreational therapy.   Physician Treatment Plan for Primary Diagnosis: Schizoaffective disorder (HCC) Long Term Goal(s): Improvement in symptoms so as ready for discharge   Short Term Goals: Ability to identify changes in lifestyle to reduce recurrence of condition will improve Ability to verbalize feelings will improve Ability to disclose and discuss suicidal ideas Ability to demonstrate self-control will improve  Medication Management: Evaluate patient's response, side effects, and tolerance of medication regimen.  Therapeutic Interventions: 1 to 1 sessions, Unit Group sessions and Medication administration.  Evaluation of Outcomes: Progressing  Physician Treatment Plan for Secondary Diagnosis: Principal Problem:   Schizoaffective disorder (HCC) Active Problems:   Substance induced mood disorder (HCC)  Long Term Goal(s): Improvement in symptoms so as ready for discharge   Short Term Goals: Ability to identify changes in lifestyle to reduce recurrence of condition will improve Ability to verbalize feelings will improve Ability to disclose and discuss suicidal ideas Ability to demonstrate self-control will improve     Medication Management: Evaluate patient's response, side effects, and tolerance of medication regimen.  Therapeutic Interventions: 1 to 1 sessions, Unit Group sessions and Medication administration.  Evaluation of Outcomes: Progressing   RN Treatment Plan for Primary  Diagnosis: Schizoaffective disorder (HCC) Long Term Goal(s): Knowledge of disease and therapeutic regimen to maintain health will improve  Short Term Goals: Ability to verbalize frustration and anger appropriately will improve, Ability to demonstrate self-control, Ability to participate in decision making will improve, Ability to verbalize feelings will improve, and Compliance with prescribed medications will improve  Medication Management: RN will administer medications as ordered by provider, will assess and evaluate patient's response and provide education to patient for prescribed medication. RN will report any adverse and/or side effects to prescribing provider.  Therapeutic Interventions: 1 on 1 counseling sessions, Psychoeducation, Medication administration, Evaluate responses to treatment, Monitor vital signs and CBGs as ordered, Perform/monitor  CIWA, COWS, AIMS and Fall Risk screenings as ordered, Perform wound care treatments as ordered.  Evaluation of Outcomes: Progressing   LCSW Treatment Plan for Primary Diagnosis: Schizoaffective disorder (HCC) Long Term Goal(s): Safe transition to appropriate next level of care at discharge, Engage patient in therapeutic group addressing interpersonal concerns.  Short Term Goals: Engage patient in aftercare planning with referrals and resources, Increase social support, Increase ability to appropriately verbalize feelings, Increase emotional regulation, and Increase skills for wellness and recovery  Therapeutic Interventions: Assess for all discharge needs, 1 to 1 time with Social worker, Explore available resources and support systems, Assess for adequacy in community support network, Educate family and significant other(s) on suicide prevention, Complete Psychosocial Assessment, Interpersonal group therapy.  Evaluation of Outcomes: Progressing   Progress in Treatment: Attending groups: Yes. Participating in groups: Yes. Taking medication as  prescribed: Yes. Toleration medication: Yes. Family/Significant other contact made: Yes, individual(s) contacted:  Consuella Denis [phone] Patient understands diagnosis: Yes. Discussing patient identified problems/goals with staff: Yes. Medical problems stabilized or resolved: Yes. Denies suicidal/homicidal ideation: Yes. Issues/concerns per patient self-inventory: No. Other: n/a   New problem(s) identified: No, Describe:  none   New Short Term/Long Term Goal(s): medication stabilization, elimination of SI thoughts, development of comprehensive mental wellness plan.    Patient Goals:  "Medication regulation and coping skills"   Discharge Plan or Barriers: Patient recently admitted. CSW will continue to follow and assess for appropriate referrals and possible discharge planning.     Reason for Continuation of Hospitalization: Anxiety Mania Medication stabilization Other; describe mood stabilization, discharge planning   Estimated Length of Stay: 3-5 DAYS  Last 3 [place] Suicide Severity Risk Score: Flowsheet Row Admission (Current) [facility] from 08/14/2023 in [facility] at [facility] ED from 06/26/2023 in [facility] Department at [facility]  C-SSRS RISK CATEGORY No Risk No Risk No Risk       Last PHQ 2/9 Scores:    06/13/2023    8:06 AM 11/08/2022   10:00 AM 01/14/2022    1:09 PM  Depression screen PHQ 2/9  Decreased Interest 0 0 0  Down, Depressed, Hopeless 0 0 0  PHQ - 2 Score 0 0 0  Altered sleeping 0 0 0  Tired, decreased energy 0 0 0  Change in appetite 0 0 0  Feeling bad or failure about yourself  0 0 0  Trouble concentrating 0 0 0  Moving slowly or fidgety/restless 0 0 0  Suicidal thoughts 0 0 0  PHQ-9 Score 0 0 0  Difficult doing work/chores Not difficult at all      Scribe for Treatment Team: Vonzell Guerin, LCSWA 08/22/2023 1:07 PM

## 2023-08-22 NOTE — Progress Notes (Signed)
 [facility] MD Progress Note  08/22/2023 12:58 PM Ronald [name]  MRN:  [id number] Subjective:   43 year old Caucasian male with history of schizoaffective disorder bipolar type and substance use disorder.  Involuntarily committed on account of aggressive behavior and mania.  UDS was positive for THC at presentation.  Nursing staff reports that he has been very argumentative.   Patient has a forced medication order in place.  Chart reviewed today.  Patient discussed at team.  Nursing staff reports that patient slept for 9 hours.  He is still irritable.  He is taking his meds.  Limited grooming.  He eats his meals and drinks enough fluids.  Limited interaction with the milieu.  Patient was listening to music at the dayroom prior to encounter.  He tells me that he is not schizophrenic and there is no need to take haloperidol .  I had to reassure him that it will help stabilize his mood.  Patient is more interested in stimulant medication.  He goes from one thing to another but no overt delusional theme.  No grandiosity.  No rageful thoughts towards himself or towards anyone else.  No observable adverse effects from his medications.  Have encouraged him to keep taking his medications.  I discussed plans to optimize the dose of haloperidol .  We have agreed to putting an EKG for him.  Principal Problem: Schizoaffective disorder (HCC) Diagnosis: Principal Problem:   Schizoaffective disorder (HCC) Active Problems:   Substance induced mood disorder (HCC)  Total Time spent with patient: 20 minutes  Past Psychiatric History:  See H&P  Past Medical History:  Past Medical History:  Diagnosis Date   Anxiety    Arthritis    Disorder of pineal gland    Post traumatic stress disorder     Past Surgical History:  Procedure Laterality Date   ANKLE SURGERY     Family History: History reviewed. No pertinent family history. Family Psychiatric  History:  See H&P  Social History:  Social History    Substance and Sexual Activity  Alcohol Use No     Social History   Substance and Sexual Activity  Drug Use Yes   Types: Marijuana   Comment: one year ago    Social History   Socioeconomic History   Marital status: Single    Spouse name: Not on file   Number of children: Not on file   Years of education: Not on file   Highest education level: Not on file  Occupational History   Not on file  Tobacco Use   Smoking status: Never   Smokeless tobacco: Current    Types: Chew  Vaping Use   Vaping status: Never Used  Substance and Sexual Activity   Alcohol use: No   Drug use: Yes    Types: Marijuana    Comment: one year ago   Sexual activity: Not Currently  Other Topics Concern   Not on file  Social History Narrative   Not on file   Social Drivers of Health   Financial Resource Strain: Not on file  Food Insecurity: Patient Declined (08/16/2023)   Hunger Vital Sign    Worried About Running Out of Food in the Last Year: Patient declined    Ran Out of Food in the Last Year: Patient declined  Transportation Needs: Patient Declined (08/16/2023)   PRAPARE - Administrator, Civil Service (Medical): Patient declined    Lack of Transportation (Non-Medical): Patient declined  Physical Activity: Not on file  Stress:  Not on file  Social Connections: Not on file   Additional Social History:   Current Medications: Current Facility-Administered Medications  Medication Dose Route Frequency Provider Last Rate Last Admin   acetaminophen  (TYLENOL ) tablet 650 mg  650 mg Oral Q4H PRN Mills, Shnese E, NP   650 mg at 08/19/23 2041   alum & mag hydroxide-simeth (MAALOX/MYLANTA) 200-200-20 MG/5ML suspension 30 mL  30 mL Oral Q6H PRN Mills, Shnese E, NP       divalproex  (DEPAKOTE  ER) 24 hr tablet 750 mg  750 mg Oral BID Parker, Alvin S, MD   750 mg at 08/22/23 0831   gabapentin  (NEURONTIN ) capsule 200 mg  200 mg Oral TID Mills, Shnese E, NP   200 mg at 08/22/23 1235   haloperidol   (HALDOL ) tablet 10 mg  10 mg Oral BID Jernard Reiber, Iline Mallory, MD       Or   haloperidol  lactate (HALDOL ) injection 5 mg  5 mg Intramuscular BID Romaldo Saville, Iline Mallory, MD       haloperidol  (HALDOL ) tablet 5 mg  5 mg Oral TID PRN Mills, Shnese E, NP   5 mg at 08/20/23 0745   haloperidol  lactate (HALDOL ) injection 10 mg  10 mg Intramuscular TID PRN Mills, Shnese E, NP       haloperidol  lactate (HALDOL ) injection 5 mg  5 mg Intramuscular TID PRN Mills, Shnese E, NP   5 mg at 08/21/23 1215   And   LORazepam  (ATIVAN ) injection 2 mg  2 mg Intramuscular TID PRN Mills, Shnese E, NP   2 mg at 08/21/23 1216   LORazepam  (ATIVAN ) tablet 1 mg  1 mg Oral TID PRN Geraldo Haris A, MD   1 mg at 08/20/23 2019   magnesium  hydroxide (MILK OF MAGNESIA) suspension 30 mL  30 mL Oral Daily PRN Mills, Shnese E, NP       magnesium  oxide (MAG-OX) tablet 400 mg  400 mg Oral QHS [name], [name], NP   400 mg at 08/21/23 2138   multivitamin with minerals tablet 1 tablet  1 tablet Oral Daily Carrion-Carrero, [name], MD   1 tablet at 08/22/23 7253   nicotine  (NICODERM CQ  - dosed in mg/24 hours) patch 14 mg  14 mg Transdermal Daily Attiah, Nadir, MD   14 mg at 08/22/23 0831   nicotine  polacrilex (NICORETTE ) gum 2 mg  2 mg Oral PRN Mills, Shnese E, NP   2 mg at 08/22/23 1236   ondansetron  (ZOFRAN ) tablet 4 mg  4 mg Oral Q8H PRN Mills, Shnese E, NP       traZODone  (DESYREL ) tablet 100 mg  100 mg Oral QHS Carrion-Carrero, [name], MD   100 mg at 08/21/23 2138    Lab Results: No results found for this or any previous visit (from the past 48 hours).  Blood Alcohol level:  Lab Results  Component Value Date   [facility] <15 08/15/2023   ETH <10 05/17/2023    Metabolic Disorder Labs: Lab Results  Component Value Date   HGBA1C 4.9 05/17/2023   MPG 93.93 05/17/2023   MPG 93.93 09/29/2021   No results found for: "PROLACTIN" Lab Results  Component Value Date   CHOL 152 05/17/2023   TRIG 70 05/17/2023   HDL 78 05/17/2023    CHOLHDL 1.9 05/17/2023   VLDL 14 05/17/2023   LDLCALC 60 05/17/2023   LDLCALC 55 02/19/2020    Physical Findings: AIMS:  , ,  ,  ,    CIWA:    COWS:  Musculoskeletal: Strength & Muscle Tone: within normal limits Gait & Station: normal Patient leans: N/A  Psychiatric Specialty Exam:  Presentation  General Appearance:  Casually dressed, better grooming, moderate rapport.  No EPS.  Eye Contact: Good.  Speech: Spontaneous.  Slightly pressured but not loud.  Mood and Affect  Mood: Less irritable.  Affect: Restricted and mood congruent.  Thought Process  Thought Processes: Slightly increased speed of thought.  Descriptions of Associations: Mostly circumstantial with slight flight of ideas.  Orientation:Full (Time, Place and Person)  Thought Content: No delusional preoccupation.  No current suicidal thoughts.  No homicidal thoughts.  No thoughts of violence.  No negative ruminative flooding.  No guilty ruminations.  No obsessions.  Hallucinations: No hallucination in any modality.  Sensorium  Memory: Did not assess at this time  Judgment: Limited.  Insight: Limited.  Executive Functions  Concentration: Distractible.  Attention Span: Decreased.  Recall: Did not assess at this time.  Fund of Knowledge: Good.  Language: Good   Psychomotor Activity  Normalizing psychomotor activity    Physical Exam: Physical Exam ROS Blood pressure 125/87, pulse 77, temperature 98 F (36.7 C), temperature source Oral, resp. rate 18, height 5\' 11"  (1.803 m), weight 73.5 kg, SpO2 100%. Body mass index is 22.59 kg/m.   Treatment Plan Summary: Patient is tolerating haloperidol  well.  He seems to be responding gradually.  He still has residual manic symptoms.  No dangerousness.  We will optimize haloperidol  and evaluate him further.  1.  Increase oral haloperidol  to 10 mg twice daily. 2.  Increase Depakote  ER to 2000 mg daily from tomorrow. 3.  Continue  lorazepam  1 mg 3 times daily.  We will begin to taper when he is more stable. 4.  Continue trazodone  100 mg at bedtime. 5.  Continue gabapentin  200 mg 3 times daily. 6.  Continue to encourage unit groups and therapeutic activities. 7.  Continue to monitor mood behavior and interaction with others. 8.  Social worker will coordinate discharge and aftercare planning.  Amelie Jury, MD 08/22/2023, 12:58 PM

## 2023-08-22 NOTE — Progress Notes (Signed)
 1910 Mother called at shift change to notify staff that pt just told her the Haldol  was giving him suicidal thoughts and he wanted to calm down. He verbalized, "I'm going to kill myself."    Woke pt up for bedtime meds and pt denied SI/HI/AVH.  He appeared calm/cooperative and polite and took bedtime meds without issue. Mouth check completed.  PRN nic. Gum given per request and pt asked politely.   08/21/23 2100  Psych Admission Type (Psych Patients Only)  Admission Status Involuntary  Psychosocial Assessment  Patient Complaints None  Eye Contact Intense  Facial Expression Blank  Affect Preoccupied;Constricted  Speech Logical/coherent;Other (Comment) (Polite)  Interaction Assertive;Other (Comment) (Was awakened to receive meds. Polite during med pass. Mouth check completed and pt cooperative)  Motor Activity Slow  Appearance/Hygiene Body odor;Poor hygiene  Behavior Characteristics Cooperative;Appropriate to situation;Calm;Other (Comment)  Mood Other (Comment) (Pt was sleepy)  Thought Process  Coherency WDL  Content WDL  Delusions None reported or observed  Perception WDL  Hallucination None reported or observed  Judgment Impaired  Confusion None  Danger to Self  Current suicidal ideation? Denies  Self-Injurious Behavior No self-injurious ideation or behavior indicators observed or expressed   Agreement Not to Harm Self Yes  Description of Agreement Verbal  Danger to Others  Danger to Others None reported or observed

## 2023-08-22 NOTE — Plan of Care (Addendum)
 Pt's mood remains labile with pressured, grandiose speech. He continues to be defensive, argumentative, needing and demanding on interactions. Observed yelling at staff post medication administration this morning "I told y'all I don't want this damn Haldol  and y'all keep giving it to me. I'm having all the freaking side effects on this damn paper. I have a lawyer and he will be calling y'all" yells as well in his demands for Ativan  "I told you that's the only thing that works for me is Ativan  and I should have it whenever I ask". Remains medication compliant, denies adverse effects. Safety checks maintained at Q 15 minutes intervals and pt continues to need multiple redirections due to intermittent explosive outbursts. Tolerates meals and fluids well.  Emotional support, encouragement and reassurance offered to pt.   Problem: Health Behavior/Discharge Planning: Goal: Compliance with treatment plan for underlying cause of condition will improve Outcome: Progressing   Problem: Safety: Goal: Periods of time without injury will increase Outcome: Progressing   Problem: Coping: Goal: Ability to verbalize frustrations and anger appropriately will improve Outcome: Not Progressing

## 2023-08-22 NOTE — [facility] Group Notes (Signed)
 Spirituality Group   Description: Participant directed exploration of values, beliefs and meaning   Following a brief framework of chaplain's role and ground rules of group behavior, participants are invited to share concerns or questions that engage spiritual life. Emphasis placed on common themes and shared experiences and ways to make meaning and clarify living into one's values.   Theory/Process/Goal: Utilize the theoretical framework of group therapy established by Derrell Flight, Relational Cultural Theory and Rogerian approaches to facilitate relational empathy and use of the "here and now" to foster reflection, self-awareness, and sharing.   Observations: Ronald [name] was really the only participant for group time, so this became an opportunity for him to explore his ways of meaning making and coping. Ronald [name] was pleasant and engaged openly. Themes included relationships, loss, trust, and self-forgiveness. This chaplain affirmed patient strengths, positive aspect of meaning making and ways to reframe some views, and offered relational support.

## 2023-08-23 MED ORDER — DIVALPROEX SODIUM ER 500 MG PO TB24
1000.0000 mg | ORAL_TABLET | Freq: Two times a day (BID) | ORAL | Status: DC
  Administered 2023-08-23 – 2023-09-02 (×21): 1000 mg via ORAL
  Filled 2023-08-23 (×8): qty 2
  Filled 2023-08-23: qty 28
  Filled 2023-08-23 (×15): qty 2

## 2023-08-23 NOTE — Plan of Care (Signed)
   Problem: Education: Goal: Emotional status will improve Outcome: Progressing Goal: Mental status will improve Outcome: Progressing

## 2023-08-23 NOTE — Progress Notes (Signed)
   08/23/23 2046  Psych Admission Type (Psych Patients Only)  Admission Status Involuntary  Psychosocial Assessment  Patient Complaints None  Eye Contact Intense  Facial Expression Wide-eyed  Affect Labile  Speech Loud;Logical/coherent  Interaction Assertive;Demanding  Motor Activity Restless  Appearance/Hygiene Unremarkable  Behavior Characteristics Restless  Mood Labile  Thought Process  Coherency Circumstantial  Content Blaming others  Delusions None reported or observed  Perception WDL  Hallucination None reported or observed  Judgment Impaired  Confusion None  Danger to Self  Current suicidal ideation? Denies  Danger to Others  Danger to Others None reported or observed

## 2023-08-23 NOTE — Plan of Care (Signed)
  Problem: Activity: Goal: Interest or engagement in activities will improve Outcome: Progressing   Problem: Physical Regulation: Goal: Ability to maintain clinical measurements within normal limits will improve Outcome: Progressing   Problem: Safety: Goal: Periods of time without injury will increase Outcome: Progressing

## 2023-08-23 NOTE — Progress Notes (Signed)
 EKG complete.

## 2023-08-23 NOTE — Progress Notes (Signed)
 Patient to nurse's station multiple times, stating, "the nurse told me I could have my medication right at 8, because that's when it is due". Patient was informed that staff is in report and that he will receive his medication at scheduled time. Patient was redirected by staff and participated in group. Patient refused oral Haldol , stating, "The pill causes me to feel funny, I want the shot". Patient educated on medication. Emotional support and encouragement provided.

## 2023-08-23 NOTE — Progress Notes (Signed)
   08/23/23 0800  Psych Admission Type (Psych Patients Only)  Admission Status Involuntary  Psychosocial Assessment  Patient Complaints None  Eye Contact Intense  Facial Expression Wide-eyed  Affect Labile  Speech Logical/coherent  Interaction Assertive  Motor Activity Restless  Appearance/Hygiene Unremarkable  Behavior Characteristics Restless  Mood Labile  Thought Process  Coherency Circumstantial  Content Blaming others  Delusions None reported or observed  Perception WDL  Hallucination None reported or observed  Judgment Impaired  Confusion None  Danger to Self  Current suicidal ideation? Denies  Agreement Not to Harm Self Yes  Description of Agreement verbal  Danger to Others  Danger to Others None reported or observed

## 2023-08-23 NOTE — Progress Notes (Addendum)
 Patient alert and oriented. Patient denies SI, HI, AVH, and pain.  Scheduled medications administered to patient, per provider orders. Support and encouragement provided. Routine safety checks conducted every 15 minutes. Patient verbally contracts for safety and remains safe on the unit.    08/22/23 2010  Psych Admission Type (Psych Patients Only)  Admission Status Involuntary  Psychosocial Assessment  Patient Complaints Anxiety;Hyperactivity;Nervousness;Suspiciousness  Eye Contact Intense;Watchful  Facial Expression Anxious;Worried;Wide-eyed  Affect Fearful;Anxious;Labile  Speech Pressured;Rapid  Interaction Defensive;Demanding  Motor Activity Fidgety;Restless  Appearance/Hygiene Unremarkable  Behavior Characteristics Restless;Anxious  Mood Labile;Suspicious  Thought Process  Coherency Circumstantial  Content Blaming others;Paranoia  Delusions None reported or observed  Perception WDL  Hallucination None reported or observed  Judgment Impaired  Confusion None  Danger to Self  Current suicidal ideation? Denies  Agreement Not to Harm Self Yes  Description of Agreement verbal  Danger to Others  Danger to Others None reported or observed

## 2023-08-23 NOTE — Progress Notes (Signed)
 [facility] MD Progress Note  08/23/2023 11:14 AM Ronald [name]  MRN:  [id number] Subjective:   43 year old Caucasian male with history of schizoaffective disorder bipolar type and substance use disorder.  Involuntarily committed on account of aggressive behavior and mania.  UDS was positive for THC at presentation.  Nursing staff reports that he has been very argumentative.   Patient has a forced medication order in place.  Chart reviewed today.  Patient discussed at team.  Nursing staff reports that patient is still labile and demanding.  He prefers to get Haldol  as a shot throughout and take the pill.  He slept for 8 hours.  He took as needed lorazepam  on one occasion.  Seen today.  Patient states that he would prefer to split his Depakote  into twice daily.  I did explain that it is extended release and ideally should be taking once daily.  We have agreed to split it to twice daily.  Patient has limited insight as he feels like he does not really need to take the injection but if he has to he will take it here.  He is not endorsing any other worries or any other concerns.  Not endorsing any rageful thoughts towards himself or towards others.  No adverse effects from his medications. Encouraged to keep ventilating his feelings to staff.  Principal Problem: Schizoaffective disorder (HCC) Diagnosis: Principal Problem:   Schizoaffective disorder (HCC) Active Problems:   Substance induced mood disorder (HCC)  Total Time spent with patient: 20 minutes  Past Psychiatric History:  See H&P  Past Medical History:  Past Medical History:  Diagnosis Date   Anxiety    Arthritis    Disorder of pineal gland    Post traumatic stress disorder     Past Surgical History:  Procedure Laterality Date   ANKLE SURGERY     Family History: History reviewed. No pertinent family history. Family Psychiatric  History:  See H&P  Social History:  Social History   Substance and Sexual Activity  Alcohol Use  No     Social History   Substance and Sexual Activity  Drug Use Yes   Types: Marijuana   Comment: one year ago    Social History   Socioeconomic History   Marital status: Single    Spouse name: Not on file   Number of children: Not on file   Years of education: Not on file   Highest education level: Not on file  Occupational History   Not on file  Tobacco Use   Smoking status: Never   Smokeless tobacco: Current    Types: Chew  Vaping Use   Vaping status: Never Used  Substance and Sexual Activity   Alcohol use: No   Drug use: Yes    Types: Marijuana    Comment: one year ago   Sexual activity: Not Currently  Other Topics Concern   Not on file  Social History Narrative   Not on file   Social Drivers of Health   Financial Resource Strain: Not on file  Food Insecurity: Patient Declined (08/16/2023)   Hunger Vital Sign    Worried About Running Out of Food in the Last Year: Patient declined    Ran Out of Food in the Last Year: Patient declined  Transportation Needs: Patient Declined (08/16/2023)   PRAPARE - Administrator, Civil Service (Medical): Patient declined    Lack of Transportation (Non-Medical): Patient declined  Physical Activity: Not on file  Stress: Not on file  Social Connections: Not on file   Additional Social History:   Current Medications: Current Facility-Administered Medications  Medication Dose Route Frequency Provider Last Rate Last Admin   acetaminophen  (TYLENOL ) tablet 650 mg  650 mg Oral Q4H PRN Mills, Shnese E, NP   650 mg at 08/22/23 1748   alum & mag hydroxide-simeth (MAALOX/MYLANTA) 200-200-20 MG/5ML suspension 30 mL  30 mL Oral Q6H PRN Mills, Shnese E, NP       divalproex  (DEPAKOTE  ER) 24 hr tablet 1,000 mg  1,000 mg Oral q12n4p Deegan Valentino, Iline Mallory, MD       gabapentin  (NEURONTIN ) capsule 200 mg  200 mg Oral TID Mills, Shnese E, NP   200 mg at 08/23/23 0747   haloperidol  (HALDOL ) tablet 10 mg  10 mg Oral BID Devarion Mcclanahan, Iline Mallory,  MD       Or   haloperidol  lactate (HALDOL ) injection 5 mg  5 mg Intramuscular BID Lourdes Manning, Iline Mallory, MD   5 mg at 08/23/23 0749   haloperidol  (HALDOL ) tablet 5 mg  5 mg Oral TID PRN Mills, Shnese E, NP   5 mg at 08/20/23 0745   haloperidol  lactate (HALDOL ) injection 10 mg  10 mg Intramuscular TID PRN Mills, Shnese E, NP       haloperidol  lactate (HALDOL ) injection 5 mg  5 mg Intramuscular TID PRN Mills, Shnese E, NP   5 mg at 08/21/23 1215   And   LORazepam  (ATIVAN ) injection 2 mg  2 mg Intramuscular TID PRN Mills, Shnese E, NP   2 mg at 08/21/23 1216   LORazepam  (ATIVAN ) tablet 1 mg  1 mg Oral TID PRN Ekam Besson A, MD   1 mg at 08/23/23 1034   magnesium  hydroxide (MILK OF MAGNESIA) suspension 30 mL  30 mL Oral Daily PRN Mills, Shnese E, NP       magnesium  oxide (MAG-OX) tablet 400 mg  400 mg Oral QHS [name], [name], NP   400 mg at 08/22/23 2020   multivitamin with minerals tablet 1 tablet  1 tablet Oral Daily Carrion-Carrero, [name], MD   1 tablet at 08/23/23 0747   nicotine  (NICODERM CQ  - dosed in mg/24 hours) patch 14 mg  14 mg Transdermal Daily Attiah, Nadir, MD   14 mg at 08/23/23 0747   nicotine  polacrilex (NICORETTE ) gum 2 mg  2 mg Oral PRN Mills, Shnese E, NP   2 mg at 08/23/23 1034   ondansetron  (ZOFRAN ) tablet 4 mg  4 mg Oral Q8H PRN Mills, Shnese E, NP       traZODone  (DESYREL ) tablet 100 mg  100 mg Oral QHS Carrion-Carrero, [name], MD   100 mg at 08/22/23 2016    Lab Results: No results found for this or any previous visit (from the past 48 hours).  Blood Alcohol level:  Lab Results  Component Value Date   [facility] <15 08/15/2023   ETH <10 05/17/2023    Metabolic Disorder Labs: Lab Results  Component Value Date   HGBA1C 4.9 05/17/2023   MPG 93.93 05/17/2023   MPG 93.93 09/29/2021   No results found for: "PROLACTIN" Lab Results  Component Value Date   CHOL 152 05/17/2023   TRIG 70 05/17/2023   HDL 78 05/17/2023   CHOLHDL 1.9 05/17/2023   VLDL 14 05/17/2023    LDLCALC 60 05/17/2023   LDLCALC 55 02/19/2020    Physical Findings: AIMS:  , ,  ,  ,    CIWA:    COWS:  Musculoskeletal: Strength & Muscle Tone: within normal limits Gait & Station: normal Patient leans: N/A  Psychiatric Specialty Exam:  Presentation  General Appearance:  Casually dressed, in bed this morning, not in any distress, moderate rapport.  No EPS.  Eye Contact: Good.  Speech: Spontaneous.  Slightly pressured but not loud.  Mood and Affect  Mood: Underlying irritability.  Affect: Restricted and mood congruent.  Thought Process  Thought Processes: Slightly increased speed of thought.  Descriptions of Associations: Mostly circumstantial with slight flight of ideas.  Orientation:Full (Time, Place and Person)  Thought Content: No delusional preoccupation.  No current suicidal thoughts.  No homicidal thoughts.  No thoughts of violence.  No negative ruminative flooding.  No guilty ruminations.  No obsessions.  Hallucinations: No hallucination in any modality.  Sensorium  Memory: Did not assess at this time  Judgment: Limited.  Insight: Limited.  Executive Functions  Concentration: Distractible.  Attention Span: Decreased.  Recall: Did not assess at this time.  Fund of Knowledge: Good.  Language: Good   Psychomotor Activity  Normalizing psychomotor activity    Physical Exam: Physical Exam ROS Blood pressure 125/83, pulse 63, temperature 98 F (36.7 C), temperature source Oral, resp. rate 18, height 5\' 11"  (1.803 m), weight 73.5 kg, SpO2 100%. Body mass index is 22.59 kg/m.   Treatment Plan Summary: Patient has tolerated recent optimization in dose of his medication well.  No adverse effects.  He would prefer to get Depakote  as a split dose.  We will keep his medicine the same and give it time to kick in.  1.  Haloperidol  10 mg twice daily. 2.  Depakote  ER 1000 mg twice daily. 3.  Continue lorazepam  1 mg 3 times daily.   We will begin to taper when he is more stable. 4.  Continue trazodone  100 mg at bedtime. 5.  Continue gabapentin  200 mg 3 times daily. 6.  Continue to encourage unit groups and therapeutic activities. 7.  Continue to monitor mood behavior and interaction with others. 8.  Social worker will coordinate discharge and aftercare planning. 9.  EKG today.  Amelie Jury, MD 08/23/2023, 11:14 AM

## 2023-08-23 NOTE — [facility] Group Notes (Signed)
 [facility] Group Notes:  (Nursing/MHT/Case Management/Adjunct)  Date:  08/23/2023  Time:  1:01 PM  Type of Therapy:  Music Therapy  Participation Level:  Active  Participation Quality:  Appropriate  Affect:  Appropriate  Cognitive:  Alert and Appropriate  Insight:  Appropriate and Good  Engagement in Group:  Engaged and Supportive  Modes of Intervention:  Activity and Socialization  Summary of Progress/Problems: Patient was attentive and participated.  Socialized with peers in the dayroom.  Ronald [name] 08/23/2023, 1:01 PM

## 2023-08-24 DIAGNOSIS — F259 Schizoaffective disorder, unspecified: Secondary | ICD-10-CM

## 2023-08-24 NOTE — Progress Notes (Signed)
   08/24/23 0800  Psych Admission Type (Psych Patients Only)  Admission Status Involuntary  Psychosocial Assessment  Patient Complaints Anxiety  Eye Contact Brief  Facial Expression Flat  Affect Flat  Speech Soft;Slow  Interaction Cautious;Guarded  Motor Activity Other (Comment)  Appearance/Hygiene Unremarkable  Behavior Characteristics Anxious  Mood Anxious  Thought Process  Coherency Circumstantial  Content Blaming others  Delusions None reported or observed  Perception WDL  Hallucination None reported or observed  Judgment Poor  Confusion Mild  Danger to Self  Current suicidal ideation? Denies  Agreement Not to Harm Self Yes  Description of Agreement verbal  Danger to Others  Danger to Others None reported or observed

## 2023-08-24 NOTE — Progress Notes (Signed)
   08/24/23 0546  15 Minute Checks  Location Hallway  Visual Appearance Calm  Behavior Composed  Sleep (Behavioral Health Patients Only)  Calculate sleep? (Click Yes once per 24 hr at 0600 safety check) Yes  Documented sleep last 24 hours 7.75

## 2023-08-24 NOTE — Plan of Care (Signed)
  Problem: Activity: Goal: Sleeping patterns will improve Outcome: Progressing   Problem: Coping: Goal: Ability to verbalize frustrations and anger appropriately will improve Outcome: Progressing   Problem: Safety: Goal: Periods of time without injury will increase Outcome: Progressing

## 2023-08-24 NOTE — Group Note (Signed)
 Date:  08/24/2023 Time:  8:44 PM  Group Topic/Focus:  Wrap-Up Group:   The focus of this group is to help patients review their daily goal of treatment and discuss progress on daily workbooks.    Participation Level:  Active  Participation Quality:  Appropriate  Affect:  Appropriate  Cognitive:  Appropriate  Insight: Appropriate  Engagement in Group:  Developing/Improving  Modes of Intervention:  Discussion  Additional Comments:  Pt stated his goal for today was to focus on his treatment plan, call his mother, and medication regulation. Pt stated he accomplished his goals today. Pt stated he talked with his doctor and social worker about his care today. Pt rated his overall day a 9 out of 10. Pt stated he was able to contact his mother today which improved his overall day. Pt stated he felt better about himself today. Pt stated he was able to attend all meals. Pt stated he took all medications provided today. Pt stated he attend all groups held today. Pt stated his appetite was pretty good today. Pt rated sleep last night was pretty good. Pt stated the goal tonight was to get some rest. Pt stated he had some physical pain tonight. Pt stated he had some mild pain in his right ankle. Pt rated the mild pain in his right ankle a 4 on the pain level scale. Pt nurse was updated on the situation. Pt deny visual hallucinations and auditory issues tonight. Pt denies thoughts of harming himself or others. Pt stated he would alert staff if anything changed  Ronald [name] 08/24/2023, 8:44 PM

## 2023-08-24 NOTE — Plan of Care (Signed)

## 2023-08-24 NOTE — Progress Notes (Signed)
 [facility] MD Progress Note  08/24/2023 4:01 PM Ronald [name]  MRN:  [id number] Subjective:    Ronald [name] is a 43 year old male with a past psychiatric history significant for schizoaffective disorder (unspecified type) and substance-induced mood disorder who was admitted to [facility] due to aggressive and manic behavior.  Patient was subsequently IVC'd and admitted to [facility] under IVC status.  Patient was assessed by Jacqulynn Maxin, PA-C for reevaluation.  Patient is currently being managed on the following psychiatric medications:  Haloperidol  10 mg 2 times daily Depakote  ER 1000 mg 3 times daily Lorazepam  1 mg 3 times daily (taper lorazepam  once patient is more stable) Trazodone  100 mg at bedtime Gabapentin  200 mg 3 times daily  Patient informed provider that he had concerns about his Haldol  use.  He reports that his use of Haldol  has been causing him severe panic attacks and negative thoughts.  In addition to panic attacks, patient states that the use of Haldol  makes him feel like he is dying.  He also reports that his use of the medication has caused imminent threats of death on his family members.  He reports that he feels like his heart is about to explode from his use of Haldol  and states that he needs Ativan  to help manage his symptoms.  He reports that the only way he will continue taking Haldol  as if he is allowed to take the medication at night.  Per chart review, patient has been administered Haldol  via injection due to refusing to take the oral medication.  Provider informed patient that if he agrees to take his Haldol  orally tonight (10 mg total), then he can continue taking Haldol  20 mg orally at bedtime.  Patient vocalized understanding.  Patient reports that his anxiety has also been elevated due to not being able to go into the day room.  He also reports that his anxiety is elevated due to not being able to talk to anyone about his problems.  Patient reports that  he is conflicted about the deaths that have occurred in his family stating that his father died 5 years ago on Mother's Day.  He reports that death and poverty continue to follow him wherever he goes.  Patient denies depression at this time.  Patient is alert and oriented x 4, calm, cooperative, and fully engaged in conversation during the encounter.  Patient maintains good eye contact.  Patient's speech is clear, coherent, and with normal rate.  Patient's thought processes are coherent and goal directed.  Patient exhibits obsessive thought content.  Patient exhibits anxious mood with congruent affect.  Patient denies suicidal or homicidal ideations.  He further denies auditory or visual hallucinations and does not appear to be responding to internal/external stimuli.  Patient endorses good sleep and receives on average 7 to 8 hours of sleep per night.  Patient endorses good appetite and eats on average 3 meals per day.  Principal Problem: Schizoaffective disorder (HCC) Diagnosis: Principal Problem:   Schizoaffective disorder (HCC) Active Problems:   Substance induced mood disorder (HCC)  Total Time spent with patient: 20 minutes  Past Psychiatric History:  Schizoaffective disorder (unspecified type) Substance-induced mood disorder  Past Medical History:  Past Medical History:  Diagnosis Date   Anxiety    Arthritis    Disorder of pineal gland    Post traumatic stress disorder     Past Surgical History:  Procedure Laterality Date   ANKLE SURGERY     Family History:  History reviewed. No pertinent family history. Family Psychiatric  History:  Social History:  Social History   Substance and Sexual Activity  Alcohol Use No     Social History   Substance and Sexual Activity  Drug Use Yes   Types: Marijuana   Comment: one year ago    Social History   Socioeconomic History   Marital status: Single    Spouse name: Not on file   Number of children: Not on file   Years of  education: Not on file   Highest education level: Not on file  Occupational History   Not on file  Tobacco Use   Smoking status: Never   Smokeless tobacco: Current    Types: Chew  Vaping Use   Vaping status: Never Used  Substance and Sexual Activity   Alcohol use: No   Drug use: Yes    Types: Marijuana    Comment: one year ago   Sexual activity: Not Currently  Other Topics Concern   Not on file  Social History Narrative   Not on file   Social Drivers of Health   Financial Resource Strain: Not on file  Food Insecurity: Patient Declined (08/16/2023)   Hunger Vital Sign    Worried About Running Out of Food in the Last Year: Patient declined    Ran Out of Food in the Last Year: Patient declined  Transportation Needs: Patient Declined (08/16/2023)   PRAPARE - Administrator, Civil Service (Medical): Patient declined    Lack of Transportation (Non-Medical): Patient declined  Physical Activity: Not on file  Stress: Not on file  Social Connections: Not on file   Additional Social History:  H&P  Sleep: Good  Appetite:  Good  Current Medications: Current Facility-Administered Medications  Medication Dose Route Frequency Provider Last Rate Last Admin   acetaminophen  (TYLENOL ) tablet 650 mg  650 mg Oral Q4H PRN Mills, Shnese E, NP   650 mg at 08/24/23 0541   alum & mag hydroxide-simeth (MAALOX/MYLANTA) 200-200-20 MG/5ML suspension 30 mL  30 mL Oral Q6H PRN Mills, Shnese E, NP       divalproex  (DEPAKOTE  ER) 24 hr tablet 1,000 mg  1,000 mg Oral q12n4p Izediuno, Vincent A, MD   1,000 mg at 08/24/23 0805   gabapentin  (NEURONTIN ) capsule 200 mg  200 mg Oral TID Mills, Shnese E, NP   200 mg at 08/24/23 1223   haloperidol  (HALDOL ) tablet 10 mg  10 mg Oral BID Izediuno, Iline Mallory, MD       Or   haloperidol  lactate (HALDOL ) injection 5 mg  5 mg Intramuscular BID Izediuno, Iline Mallory, MD   5 mg at 08/24/23 0806   haloperidol  (HALDOL ) tablet 5 mg  5 mg Oral TID PRN Mills, Shnese  E, NP   5 mg at 08/20/23 0745   haloperidol  lactate (HALDOL ) injection 10 mg  10 mg Intramuscular TID PRN Mills, Shnese E, NP       haloperidol  lactate (HALDOL ) injection 5 mg  5 mg Intramuscular TID PRN Mills, Shnese E, NP   5 mg at 08/21/23 1215   And   LORazepam  (ATIVAN ) injection 2 mg  2 mg Intramuscular TID PRN Mills, Shnese E, NP   2 mg at 08/21/23 1216   LORazepam  (ATIVAN ) tablet 1 mg  1 mg Oral TID PRN Amelie Jury, MD   1 mg at 08/24/23 1437   magnesium  hydroxide (MILK OF MAGNESIA) suspension 30 mL  30 mL Oral Daily PRN Monta Anton,  Shnese E, NP       magnesium  oxide (MAG-OX) tablet 400 mg  400 mg Oral QHS [name], [name], NP   400 mg at 08/23/23 2046   multivitamin with minerals tablet 1 tablet  1 tablet Oral Daily Carrion-Carrero, [name], MD   1 tablet at 08/24/23 0806   nicotine  (NICODERM CQ  - dosed in mg/24 hours) patch 14 mg  14 mg Transdermal Daily Attiah, Nadir, MD   14 mg at 08/24/23 1610   nicotine  polacrilex (NICORETTE ) gum 2 mg  2 mg Oral PRN Mills, Shnese E, NP   2 mg at 08/24/23 1437   ondansetron  (ZOFRAN ) tablet 4 mg  4 mg Oral Q8H PRN Mills, Shnese E, NP       traZODone  (DESYREL ) tablet 100 mg  100 mg Oral QHS Carrion-Carrero, [name], MD   100 mg at 08/23/23 2046    Lab Results: No results found for this or any previous visit (from the past 48 hours).  Blood Alcohol level:  Lab Results  Component Value Date   [facility] <15 08/15/2023   ETH <10 05/17/2023    Metabolic Disorder Labs: Lab Results  Component Value Date   HGBA1C 4.9 05/17/2023   MPG 93.93 05/17/2023   MPG 93.93 09/29/2021   No results found for: "PROLACTIN" Lab Results  Component Value Date   CHOL 152 05/17/2023   TRIG 70 05/17/2023   HDL 78 05/17/2023   CHOLHDL 1.9 05/17/2023   VLDL 14 05/17/2023   LDLCALC 60 05/17/2023   LDLCALC 55 02/19/2020    Physical Findings: AIMS:  , ,  ,  ,    CIWA:    COWS:     Musculoskeletal: Strength & Muscle Tone: within normal limits Gait & Station:  normal Patient leans: N/A  Psychiatric Specialty Exam:  Presentation  General Appearance:  Appropriate for Environment; Casual  Eye Contact: Good  Speech: Clear and Coherent; Normal Rate  Speech Volume: Normal  Handedness: Right   Mood and Affect  Mood: Anxious  Affect: Congruent   Thought Process  Thought Processes: Coherent; Goal Directed  Descriptions of Associations:Intact  Orientation:Full (Time, Place and Person)  Thought Content:Obsessions  History of Schizophrenia/Schizoaffective disorder:No  Duration of Psychotic Symptoms:Less than six months  Hallucinations:Hallucinations: None  Ideas of Reference:Delusions  Suicidal Thoughts:Suicidal Thoughts: No  Homicidal Thoughts:Homicidal Thoughts: No   Sensorium  Memory: Immediate Good  Judgment: Fair  Insight: Fair   Art therapist  Concentration: Fair  Attention Span: Fair  Recall: Fair  Fund of Knowledge: Good  Language: Good   Psychomotor Activity  Psychomotor Activity: Psychomotor Activity: Normal   Assets  Assets: Communication Skills; Desire for Improvement; Physical Health; Resilience   Sleep  Sleep: Sleep: Good Number of Hours of Sleep: 7.5    Physical Exam: Physical Exam Constitutional:      Appearance: Normal appearance.  HENT:     Head: Normocephalic and atraumatic.     Nose: Nose normal.  Eyes:     Extraocular Movements: Extraocular movements intact.  Cardiovascular:     Rate and Rhythm: Normal rate and regular rhythm.  Pulmonary:     Effort: Pulmonary effort is normal.  Musculoskeletal:        General: Normal range of motion.     Cervical back: Normal range of motion.  Skin:    General: Skin is warm and dry.  Neurological:     General: No focal deficit present.     Mental Status: He is alert and oriented to person,  place, and time.  Psychiatric:        Attention and Perception: Attention and perception normal. He does not perceive  auditory or visual hallucinations.        Mood and Affect: Mood is anxious.        Speech: Speech normal.        Behavior: Behavior is agitated. Behavior is cooperative.        Thought Content: Thought content is delusional. Thought content is not paranoid. Thought content does not include homicidal or suicidal ideation. Thought content does not include suicidal plan.        Cognition and Memory: Cognition and memory normal.        Judgment: Judgment normal.    Review of Systems  Constitutional: Negative.   HENT: Negative.    Eyes: Negative.   Respiratory: Negative.    Cardiovascular: Negative.   Gastrointestinal: Negative.   Skin: Negative.   Neurological: Negative.   Psychiatric/Behavioral:  Negative for depression, hallucinations, substance abuse and suicidal ideas. The patient is nervous/anxious. The patient does not have insomnia.    Blood pressure (!) 128/90, pulse 71, temperature 98 F (36.7 C), temperature source Oral, resp. rate 18, height 5\' 11"  (1.803 m), weight 73.5 kg, SpO2 99%. Body mass index is 22.59 kg/m.   Treatment Plan Summary: Daily contact with patient to assess and evaluate symptoms and progress in treatment and Medication management.   Plan: Patient presents to the encounter stating that his use of Haldol  has caused elevated anxiety, panic attacks, and imminent threats of death on his family members.  He reports that he feels like his heart will explode and requires the need of Ativan  to help manage his anxiety.  Patient states that the only way he will continue taking Haldol  as if he is allowed to take the medication at night.  Per chart review, patient has been receiving Haldol  as an injection instead of orally.  Provider informed patient that if he were to take his scheduled bedtime dose of Haldol  orally, then he could continue taking his Haldol  orally at bedtime.  Patient vocalized understanding.  Patient to be placed on Haldol  20 mg at bedtime starting  tomorrow.  Patient endorses minimal depression.  Patient still continues to exhibit some delusional thoughts.  Will continue with patient's treatment plan.  Safety and Monitoring: Voluntary admission to inpatient psychiatric unit for safety, stabilization and treatment Daily contact with patient to assess and evaluate symptoms and progress in treatment Patient's case to be discussed in multi-disciplinary team meeting Observation Level : q15 minute checks Vital signs: q12 hours Precautions: suicide, elopement, and assault   Diagnosis: Principal Problem:   Schizoaffective disorder (HCC) Active Problems:   Substance induced mood disorder (HCC)  #Schizoaffective disorder -Continue Haldol  20 mg at bedtime starting tomorrow if patient takes his haldol  (10 mg total) orally at bedtime -Continue Depakote  ER 1000 mg 2 times daily for mood stability -Continue lorazepam  1 mg 3 times daily (continued to taper as patient improves) -Continue trazodone  100 mg at bedtime -Continue gabapentin  200 mg 3 times daily  #Substance induced mood disorder  #Tobacco Cessation -Nicotine  14 mg 24-hour patch daily - Nicotine  polacrilex (Nicorette ) gum 2 mg as needed  Continue Zofran  4 mg every 8 hours as needed for nausea, vomiting  Agitation Protocol  -Haldol  5 mg 3 times daily as needed for mild agitation AND -Diphenhydramine 50 mg 3 times daily as needed for mild agitation  -Haloperidol  lactate 5 mg, intramuscular, 3 times daily as  needed, for moderate agitation AND -Diphenhydramine 50 mg, intramuscular 3 times daily as needed, for moderate agitation AND -Lorazepam  2 mg, intramuscular, 3 times daily as needed, for moderate agitation  -Haloperidol  lactate 10 mg, intramuscular, 3 times daily as needed, for severe agitation AND -Diphenhydramine 50 mg, intramuscular, 3 times daily as needed, for severe agitation AND -Lorazepam  2 mg, intramuscular, 3 times daily as needed, for severe agitation  As needed  medications: -Patient to continue taking Tylenol  650 mg every 6 hours as needed for mild pain -Patient to continue taking Maalox/Mylanta 30 mL every 4 hours as needed for indigestion -Patient to continue taking Milk of Magnesia 30 mL as needed for mild constipation -Hydroxyzine  25 mg 3 times daily as needed for anxiety -Trazodone  50 mg at bedtime as needed for insomnia   --  The risks/benefits/side-effects/alternatives to medications were discussed in detail with the patient and time was given for questions. The patient consents to medication trials.    -- Metabolic profile and EKG monitoring obtained while on an atypical antipsychotic (BMI: Lipid Panel: HbgA1c: QTc:)  -- Encouraged patient to participate in unit milieu and in scheduled group therapies   Discharge Planning: Social work and case management to assist with discharge planning and identification of hospital follow-up needs prior to discharge Estimated LOS: 5-7 days Discharge Concerns: Need to establish a safety plan; Medication compliance and effectiveness Discharge Goals: Return home with outpatient referrals for mental health follow-up including medication management/psychotherapy  I certify that inpatient services furnished can reasonably be expected to improve the patient's condition.    Gates Kasal, PA 08/24/2023, 4:01 PM

## 2023-08-25 MED ORDER — HALOPERIDOL LACTATE 5 MG/ML IJ SOLN
10.0000 mg | Freq: Every day | INTRAMUSCULAR | Status: DC
  Filled 2023-08-25 (×3): qty 2

## 2023-08-25 MED ORDER — HALOPERIDOL 5 MG PO TABS
20.0000 mg | ORAL_TABLET | Freq: Every day | ORAL | Status: DC
  Administered 2023-08-25 – 2023-08-26 (×2): 20 mg via ORAL
  Filled 2023-08-25 (×4): qty 4

## 2023-08-25 NOTE — Group Note (Signed)
 LCSW Group Therapy Note   Group Date: 08/25/2023 Start Time: 1100 End Time: 1200   Participation:  at first, patient was disruptive sitting at the door and speaking loudly.  Then he came to the class.  Some of his comments were insightful, he was also speaking not on topic (he wants to be discharged).  Patient experienced pressured speech.    Type of Therapy:  Group Therapy  Topic:  Stress Less:  Nurturing Your Mind and Body Through Calm   Objective: Learn techniques for managing stress through body relaxation, mindfulness, and self-compassion.  Goals: Use body relaxation techniques, such as Box Breathing and Progressive Muscle Relaxation, to reduce physical tension. Practice mindfulness to break the cycle of overthinking and mental chatter. Embrace self-compassion to handle stress with kindness and resilience.  Summary: Today's session focused on calming the body with relaxation techniques, breaking the cycle of stress with mindfulness, and using self-compassion to manage challenges more gracefully. These tools help reduce stress and foster a balanced, peaceful mindset.  Therapeutic Modalities used:  Elements of CBT ( cognitive restructuring)  Elements of DBT (box breathing, progressive body relaxation, mindfulness, acceptance)    [place] Nishi O Thayer Inabinet, LCSWA 08/25/2023  2:29 PM

## 2023-08-25 NOTE — Progress Notes (Signed)
   08/25/23 0800  Psych Admission Type (Psych Patients Only)  Admission Status Involuntary  Psychosocial Assessment  Patient Complaints None  Eye Contact Fair  Facial Expression Flat  Affect Labile  Speech Soft  Interaction Assertive  Motor Activity Slow  Appearance/Hygiene Unremarkable  Behavior Characteristics Restless  Mood Labile  Thought Process  Coherency WDL  Content WDL  Delusions None reported or observed  Perception WDL  Hallucination None reported or observed  Judgment Poor  Confusion None  Danger to Self  Current suicidal ideation? Denies  Agreement Not to Harm Self Yes  Description of Agreement verbal  Danger to Others  Danger to Others None reported or observed

## 2023-08-25 NOTE — Progress Notes (Signed)
 Patient began to get loud in the dayroom shortly before receiving his nighttime medication. He claims that he is to receive his medication at 2000 instead of 2030. In addition, he verbalized that waiting on his medication made him feel like he was going to have an anxiety attack . He finally mentioned that he was upset because the Haldol  was causing him to have an anxiety attack since the medication is "synthetic".

## 2023-08-25 NOTE — Progress Notes (Addendum)
 [facility] MD Progress Note  08/25/2023 1:06 PM ISSIAC SEACHRIST  MRN:  [id number] Subjective:   Ronald [name] is a 43 yr old male who presented on 5/2 to [facility] under IVC due to bizarre, aggressive behavior and hallucinations, he was admitted to [facility] on 5/4.  PPHx is significant for Schizoaffective Disorder, Bipolar Type, GAD, PTSD, and Polysubstance Abuse (Benzo, THC), and 5 Prior Psychiatric Hospitalizations (last- [facility] 05/2023).  Case was discussed in the multidisciplinary team. MAR was reviewed and patient was compliant with medications.  He received PRN Tylenol  and Ativan  yesterday.   Psychiatric Team made the following recommendations yesterday: -Continue Depakote  1000 mg BID for mood stability -Continue Haldol  10 mg BID PO or IM for psychosis and mood stability    On interview today patient reports he slept good last night.  He reports his appetite is doing good.  He reports no SI, HI, or AVH.  He reports no Paranoia or Ideas of Reference.  He reports no issues with his medications other than that the Haldol  is making him anxious.  Discussed with him that since he had taken his Haldol  last night without issue we would proceed with combining his Haldol  to a single dose this evening.  Encouraged him to continue taking his medications.  He reports continued concern over his mother and that he is supposed to be her caretaker.  He reports no other concerns at present.   Principal Problem: Schizoaffective disorder (HCC) Diagnosis: Principal Problem:   Schizoaffective disorder (HCC) Active Problems:   Substance induced mood disorder (HCC)  Total Time spent with patient:  I personally spent 35 minutes on the unit in direct patient care. The direct patient care time included face-to-face time with the patient, reviewing the patient's chart, communicating with other professionals, and coordinating care. Greater than 50% of this time was spent in counseling or coordinating care with the patient  regarding goals of hospitalization, psycho-education, and discharge planning needs.   Past Psychiatric History:  Schizoaffective Disorder, Bipolar Type, GAD, PTSD, and Polysubstance Abuse (Benzo, THC), and 5 Prior Psychiatric Hospitalizations (last- [facility] 05/2023).  Past Medical History:  Past Medical History:  Diagnosis Date   Anxiety    Arthritis    Disorder of pineal gland    Post traumatic stress disorder     Past Surgical History:  Procedure Laterality Date   ANKLE SURGERY     Family History: History reviewed. No pertinent family history. Family Psychiatric  History:  No Known  Social History:  Social History   Substance and Sexual Activity  Alcohol Use No     Social History   Substance and Sexual Activity  Drug Use Yes   Types: Marijuana   Comment: one year ago    Social History   Socioeconomic History   Marital status: Single    Spouse name: Not on file   Number of children: Not on file   Years of education: Not on file   Highest education level: Not on file  Occupational History   Not on file  Tobacco Use   Smoking status: Never   Smokeless tobacco: Current    Types: Chew  Vaping Use   Vaping status: Never Used  Substance and Sexual Activity   Alcohol use: No   Drug use: Yes    Types: Marijuana    Comment: one year ago   Sexual activity: Not Currently  Other Topics Concern   Not on file  Social History Narrative   Not on file  Social Drivers of Corporate investment banker Strain: Not on file  Food Insecurity: Patient Declined (08/16/2023)   Hunger Vital Sign    Worried About Running Out of Food in the Last Year: Patient declined    Ran Out of Food in the Last Year: Patient declined  Transportation Needs: Patient Declined (08/16/2023)   PRAPARE - Administrator, Civil Service (Medical): Patient declined    Lack of Transportation (Non-Medical): Patient declined  Physical Activity: Not on file  Stress: Not on file  Social  Connections: Not on file   Additional Social History:                         Sleep: Good   Appetite:  Good   Current Medications: Current Facility-Administered Medications  Medication Dose Route Frequency Provider Last Rate Last Admin   acetaminophen  (TYLENOL ) tablet 650 mg  650 mg Oral Q4H PRN Mills, Shnese E, NP   650 mg at 08/24/23 0541   alum & mag hydroxide-simeth (MAALOX/MYLANTA) 200-200-20 MG/5ML suspension 30 mL  30 mL Oral Q6H PRN Mills, Shnese E, NP       divalproex  (DEPAKOTE  ER) 24 hr tablet 1,000 mg  1,000 mg Oral q12n4p Izediuno, Vincent A, MD   1,000 mg at 08/25/23 0744   gabapentin  (NEURONTIN ) capsule 200 mg  200 mg Oral TID Mills, Shnese E, NP   200 mg at 08/25/23 1142   haloperidol  (HALDOL ) tablet 20 mg  20 mg Oral QHS Malak Orantes S, MD       Or   haloperidol  lactate (HALDOL ) injection 10 mg  10 mg Intramuscular QHS Brigido Mera S, MD       haloperidol  (HALDOL ) tablet 5 mg  5 mg Oral TID PRN Mills, Shnese E, NP   5 mg at 08/20/23 0745   haloperidol  lactate (HALDOL ) injection 10 mg  10 mg Intramuscular TID PRN Mills, Shnese E, NP       haloperidol  lactate (HALDOL ) injection 5 mg  5 mg Intramuscular TID PRN Mills, Shnese E, NP   5 mg at 08/21/23 1215   And   LORazepam  (ATIVAN ) injection 2 mg  2 mg Intramuscular TID PRN Mills, Shnese E, NP   2 mg at 08/21/23 1216   LORazepam  (ATIVAN ) tablet 1 mg  1 mg Oral TID PRN Izediuno, Vincent A, MD   1 mg at 08/25/23 0511   magnesium  hydroxide (MILK OF MAGNESIA) suspension 30 mL  30 mL Oral Daily PRN Mills, Shnese E, NP       magnesium  oxide (MAG-OX) tablet 400 mg  400 mg Oral QHS [name], [name], NP   400 mg at 08/24/23 2021   multivitamin with minerals tablet 1 tablet  1 tablet Oral Daily Carrion-Carrero, [name], MD   1 tablet at 08/25/23 0744   nicotine  (NICODERM CQ  - dosed in mg/24 hours) patch 14 mg  14 mg Transdermal Daily Attiah, Nadir, MD   14 mg at 08/25/23 0745   nicotine  polacrilex (NICORETTE )  gum 2 mg  2 mg Oral PRN Mills, Shnese E, NP   2 mg at 08/25/23 1126   ondansetron  (ZOFRAN ) tablet 4 mg  4 mg Oral Q8H PRN Mills, Shnese E, NP       traZODone  (DESYREL ) tablet 100 mg  100 mg Oral QHS Carrion-Carrero, [name], MD   100 mg at 08/24/23 2022    Lab Results:  No results found for this or any previous visit (from the  past 48 hours).   Blood Alcohol level:  Lab Results  Component Value Date   [facility] <15 08/15/2023   ETH <10 05/17/2023    Metabolic Disorder Labs: Lab Results  Component Value Date   HGBA1C 4.9 05/17/2023   MPG 93.93 05/17/2023   MPG 93.93 09/29/2021   No results found for: "PROLACTIN" Lab Results  Component Value Date   CHOL 152 05/17/2023   TRIG 70 05/17/2023   HDL 78 05/17/2023   CHOLHDL 1.9 05/17/2023   VLDL 14 05/17/2023   LDLCALC 60 05/17/2023   LDLCALC 55 02/19/2020    Physical Findings: AIMS:  , ,  ,  ,    CIWA:    COWS:     Musculoskeletal: Strength & Muscle Tone: within normal limits Gait & Station: normal Patient leans: N/A  Psychiatric Specialty Exam:  Presentation  General Appearance:  Appropriate for Environment; Casual  Eye Contact: Fair  Speech: Clear and Coherent; Normal Rate  Speech Volume: Normal  Handedness: Right   Mood and Affect  Mood: Anxious  Affect: Congruent; Constricted   Thought Process  Thought Processes: Coherent  Descriptions of Associations:Intact  Orientation:Full (Time, Place and Person)  Thought Content:Rumination  History of Schizophrenia/Schizoaffective disorder:No  Duration of Psychotic Symptoms:Less than six months  Hallucinations:Hallucinations: None  Ideas of Reference:Delusions  Suicidal Thoughts:Suicidal Thoughts: No  Homicidal Thoughts:Homicidal Thoughts: No   Sensorium  Memory: Immediate Fair  Judgment: Intact  Insight: Present   Executive Functions  Concentration: Fair  Attention Span: Fair  Recall: Fair  Fund of  Knowledge: Good  Language: Good   Psychomotor Activity  Psychomotor Activity: Psychomotor Activity: Normal   Assets  Assets: Communication Skills; Desire for Improvement; Physical Health; Resilience   Sleep  Sleep: Sleep: Good Number of Hours of Sleep: 9    Physical Exam: Physical Exam Vitals and nursing note reviewed.  Constitutional:      General: He is not in acute distress.    Appearance: Normal appearance. He is normal weight. He is not ill-appearing or toxic-appearing.  HENT:     Head: Normocephalic and atraumatic.  Pulmonary:     Effort: Pulmonary effort is normal.  Musculoskeletal:        General: Normal range of motion.  Neurological:     General: No focal deficit present.     Mental Status: He is alert.    Review of Systems  Respiratory:  Negative for cough and shortness of breath.   Cardiovascular:  Negative for chest pain.  Gastrointestinal:  Negative for abdominal pain, constipation, diarrhea, nausea and vomiting.  Neurological:  Negative for dizziness, weakness and headaches.  Psychiatric/Behavioral:  Positive for hallucinations. Negative for depression and suicidal ideas. The patient is nervous/anxious.    Blood pressure (!) 126/91, pulse 70, temperature 97.8 F (36.6 C), temperature source Oral, resp. rate 18, height 5\' 11"  (1.803 m), weight 73.5 kg, SpO2 99%. Body mass index is 22.59 kg/m.   Treatment Plan Summary: Daily contact with patient to assess and evaluate symptoms and progress in treatment and Medication management  Ronald [name] is a 43 yr old male who presented on 5/2 to [facility] under IVC due to bizarre, aggressive behavior and hallucinations, he was admitted to [facility] on 5/4.  PPHx is significant for Schizoaffective Disorder, Bipolar Type, GAD, PTSD, and Polysubstance Abuse (Benzo, THC), and 5 Prior Psychiatric Hospitalizations (last- [facility] 05/2023).    Jaimie did take his oral Haldol  last night so we will consolidate his Haldol  to a  single dose  at night.  If he continues to take the oral dose this would be a sign that he is showing improvement.  We will not make any other changes to his medications at this time.  We will continue to monitor.     Schizoaffective Disorder, Bipolar Type: -Continue Depakote  1000 mg BID for mood stability -Consolidate Haldol  to 20 mg QHS PO or 10 mg IM for psychosis and mood stability -Non Emergent Medications Against Objection Order placed by Dr. Zouve on 5/8 -Continue Gabapentin  200 mg TID for mood stability -Continue Agitation Protocol: Haldol /Ativan    Nicotine  Dependence: -Continue Nicotine  Patch 14 mg daily -Continue Nicotine  Gum 2 mg PRN   -Continue Mag-Ox 400 mg QHS -Continue Multivitamin daily -Continue Trazodone  100 mg QHS -Continue PRN's: Tylenol , Maalox, Milk of Magnesia   --  The risks/benefits/side-effects/alternatives to medications were discussed in detail with the patient and time was given for questions. The patient consents to medication trials.                -- Metabolic profile and EKG monitoring obtained while on an atypical antipsychotic (BMI: Lipid Panel: HbgA1c: QTc:)              -- Encouraged patient to participate in unit milieu and in scheduled group therapies     Safety and Monitoring:             -- Involuntary admission to inpatient psychiatric unit for safety, stabilization and treatment             -- Daily contact with patient to assess and evaluate symptoms and progress in treatment             -- Patient's case to be discussed in multi-disciplinary team meeting             -- Observation Level : q15 minute checks             -- Vital signs:  q12 hours             -- Precautions: suicide, elopement, and assault  Discharge Planning:              -- Social work and case management to assist with discharge planning and identification of hospital follow-up needs prior to discharge             -- Estimated LOS: 3-5 more days             --  Discharge Concerns: Need to establish a safety plan; Medication compliance and effectiveness             -- Discharge Goals: Return home with outpatient referrals for mental health follow-up including medication management/psychotherapy]   Basilia Bosworth, MD 08/25/2023, 1:06 PM

## 2023-08-25 NOTE — [facility] Group Notes (Signed)
 [facility] Group Notes:  (Nursing/MHT/Case Management/Adjunct)  Date:  08/25/2023  Time:  8:53 PM  Type of Therapy:  Psychoeducational Skills  Participation Level:  Active  Participation Quality:  Appropriate  Affect:  Appropriate  Cognitive:  Appropriate  Insight:  Improving  Engagement in Group:  Developing/Improving  Modes of Intervention:  Education  Summary of Progress/Problems: The patient rated his day as a 9 out of a possible 10. He states that he had "anxiety attacks" today but that they are fewer in number. His goal for today was to work on his coping skills.   Aailyah Dunbar S 08/25/2023, 8:53 PM

## 2023-08-25 NOTE — Progress Notes (Signed)
   08/24/23 2200  Psych Admission Type (Psych Patients Only)  Admission Status Involuntary  Psychosocial Assessment  Patient Complaints Anxiety  Eye Contact Fair  Facial Expression Flat  Affect Flat  Speech Soft  Interaction Guarded  Motor Activity Slow  Appearance/Hygiene Unremarkable  Behavior Characteristics Anxious  Mood Anxious  Thought Process  Coherency WDL  Content WDL  Delusions None reported or observed  Perception WDL  Hallucination None reported or observed  Judgment Poor  Confusion None  Danger to Self  Current suicidal ideation? Denies  Self-Injurious Behavior No self-injurious ideation or behavior indicators observed or expressed   Agreement Not to Harm Self Yes  Description of Agreement verbal  Danger to Others  Danger to Others None reported or observed

## 2023-08-26 NOTE — Progress Notes (Signed)
   08/25/23 2030  Psych Admission Type (Psych Patients Only)  Admission Status Involuntary  Psychosocial Assessment  Patient Complaints Other (Comment);Irritability (Pt irritable with loud voice tones/pressured speech about not receiving meds at 2000 and c/o taking haldol .  Pt up at 0345, argumentative and testing limits. C/o roommate responding to Int stim. and again c/o being on 500 hall and forced to be here.)  Scientist, research (life sciences);Intense  Facial Expression Angry;Pensive  Affect Labile  Speech Loud;Other (Comment) (Repeats arguments)  Interaction Arrogant;Demanding;Other (Comment) (Limit testing, perseverates, fixated on time to get meds and transferring to 400 hall.)  Motor Activity Restless  Appearance/Hygiene Body odor  Behavior Characteristics Restless  Mood Labile (Pt sitting in hall in defiance, challenging staff to defy his rights. Attempted to orient the pt that his peers are patients that have rights which need to be respected as well. Pt periodically trying to engage with staff about suing the facility.)  Thought Process  Coherency WDL  Content Blaming others;Other (Comment)  Delusions Persecutory  Perception WDL  Hallucination None reported or observed  Judgment Poor  Confusion None  Danger to Self  Current suicidal ideation? Denies  Self-Injurious Behavior No self-injurious ideation or behavior indicators observed or expressed   Agreement Not to Harm Self Yes  Description of Agreement Verbal  Danger to Others  Danger to Others None reported or observed

## 2023-08-26 NOTE — [facility] Group Notes (Signed)
 [facility] Group Notes:  (Nursing/MHT/Case Management/Adjunct)  Date:  08/26/2023  Time:  2000  Type of Therapy:  Wrap up group  Participation Level:  Active  Participation Quality:  Appropriate, Attentive, Sharing, and Supportive  Affect:  Appropriate  Cognitive:  Alert  Insight:  Improving  Engagement in Group:  Engaged  Modes of Intervention:  Clarification, Education, and Socialization  Summary of Progress/Problems: Positive thinking and self-care were discussed.   Catharine Clock 08/26/2023, 9:57 PM

## 2023-08-26 NOTE — Progress Notes (Signed)
 The patient has been loud and argumentative with both of the nurses. Patient states that he knows why his roommate is in the hospital and claims that his roommate is "schizophrenic" and is talking to himself. The patient is requesting to be transferred to another hallway and that he wants to have his room to himself.

## 2023-08-26 NOTE — Group Note (Signed)
 LCSW Group Therapy Note   Group Date: 08/26/2023 Start Time: 1100 End Time: 1200   Participation:  patient was present and actively participated in the conversation.  He was insightful and knowledgeable.   Type of Therapy:  Group Therapy  Topic:  Stress Less:  Nurturing Your Mind and Body Through Calm    Objective:  Learn techniques for managing stress through body relaxation, mindfulness, and self-compassion. Goals: Use body relaxation techniques, such as Box Breathing and Progressive Muscle Relaxation, to reduce physical tension. Practice mindfulness to break the cycle of overthinking and mental chatter. Embrace self-compassion to handle stress with kindness and resilience.  Summary:  Today's session focused on calming the body with relaxation techniques, breaking the cycle of stress with mindfulness, and using self-compassion to manage challenges more gracefully. These tools help reduce stress and foster a balanced, peaceful mindset.  Therapeutic Modalities used:  Elements of CBT ( cognitive restructuring)  Elements of DBT (box breathing, progressive body relaxation, mindfulness, acceptance)    Milah Recht O Monnica Saltsman, LCSWA 08/26/2023  12:14 PM

## 2023-08-26 NOTE — Progress Notes (Signed)
   08/26/23 0800  Psych Admission Type (Psych Patients Only)  Admission Status Involuntary  Psychosocial Assessment  Patient Complaints Anxiety;Irritability  Eye Contact Intense  Facial Expression Anxious;Animated  Affect Labile  Psychologist, counselling;Defensive  Motor Activity Restless  Appearance/Hygiene Improved  Behavior Characteristics Restless  Mood Labile  Aggressive Behavior  Effect No apparent injury  Thought Process  Coherency WDL  Content Blaming others  Delusions Persecutory  Perception WDL  Hallucination None reported or observed  Judgment Poor  Confusion None  Danger to Self  Current suicidal ideation? Denies  Self-Injurious Behavior No self-injurious ideation or behavior indicators observed or expressed   Agreement Not to Harm Self Yes  Description of Agreement Verbal  Danger to Others  Danger to Others None reported or observed

## 2023-08-26 NOTE — Group Note (Signed)
 Recreation Therapy Group Note   Group Topic:Animal Assisted Therapy   Group Date: 08/26/2023 Start Time: 0945 End Time: 1030 Facilitators: Acsa Estey-McCall, LRT,CTRS Location: [address] Dayroom   Animal-Assisted Activity (AAA) Program Checklist/Progress Notes Patient Eligibility Criteria Checklist & Daily Group note for Rec Tx Intervention  AAA/T Program Assumption of Risk Form signed by Patient/ or Parent Legal Guardian Yes  Patient is free of allergies or severe asthma Yes  Patient reports no fear of animals Yes  Patient reports no history of cruelty to animals Yes  Patient understands his/her participation is voluntary Yes  Patient washes hands before animal contact Yes  Patient washes hands after animal contact Yes  Education: Hand Washing, Appropriate Animal Interaction   Education Outcome: Acknowledges education.    Affect/Mood: Appropriate   Participation Level: Engaged   Participation Quality: Independent   Behavior: Appropriate   Speech/Thought Process: Focused   Insight: Good   Judgement: Good   Modes of Intervention: Teaching laboratory technician   Patient Response to Interventions:  Engaged   Education Outcome:  In group clarification offered    Clinical Observations/Individualized Feedback: Patient attended session and interacted appropriately with therapy dog and peers. Patient asked appropriate questions about therapy dog and his training. Patient shared stories about their pets at home with group.   Plan: Continue to engage patient in RT group sessions 2-3x/week.   Ronald [name], LRT,CTRS 08/26/2023 12:51 PM
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
## 2023-08-26 NOTE — Progress Notes (Signed)
 [facility] MD Progress Note  08/26/2023 8:48 AM Ronald [name]  MRN:  [id number] Subjective:   Ronald [name] is a 43 yr old male who presented on 5/2 to [facility] under IVC due to bizarre, aggressive behavior and hallucinations, he was admitted to [facility] on 5/4.  PPHx is significant for Schizoaffective Disorder, Bipolar Type, GAD, PTSD, and Polysubstance Abuse (Benzo, THC), and 5 Prior Psychiatric Hospitalizations (last- [facility] 05/2023).  Case was discussed in the multidisciplinary team. MAR was reviewed and patient was compliant with medications.  He received PRN Tylenol  and Ativan  yesterday.   Psychiatric Team made the following recommendations yesterday: -Continue Depakote  1000 mg BID for mood stability -Consolidate Haldol  to 20 mg QHS PO or 10 mg IM for psychosis and mood stability    On interview today patient reports he slept poor last night due to having a roommate responding to internal stimuli talking most of the night.  He reports his appetite is doing good.  He reports no SI, HI, or AVH.  He reports no Paranoia or Ideas of Reference.  He reports no issues with his medications.  He reports he is much more anxious today because he had a roommate put into his room yesterday. He reports that his roommate was talking to himself all night and he is very concerned for his safety.  Discussed with him that we would work on getting him moved to another room so that this would be resolved and he was thankful.  He reports no other concerns at present.    Principal Problem: Schizoaffective disorder (HCC) Diagnosis: Principal Problem:   Schizoaffective disorder (HCC) Active Problems:   Substance induced mood disorder (HCC)  Total Time spent with patient:  I personally spent 35 minutes on the unit in direct patient care. The direct patient care time included face-to-face time with the patient, reviewing the patient's chart, communicating with other professionals, and coordinating care. Greater than 50% of  this time was spent in counseling or coordinating care with the patient regarding goals of hospitalization, psycho-education, and discharge planning needs.   Past Psychiatric History:  Schizoaffective Disorder, Bipolar Type, GAD, PTSD, and Polysubstance Abuse (Benzo, THC), and 5 Prior Psychiatric Hospitalizations (last- [facility] 05/2023).  Past Medical History:  Past Medical History:  Diagnosis Date   Anxiety    Arthritis    Disorder of pineal gland    Post traumatic stress disorder     Past Surgical History:  Procedure Laterality Date   ANKLE SURGERY     Family History: History reviewed. No pertinent family history. Family Psychiatric  History:  No Known  Social History:  Social History   Substance and Sexual Activity  Alcohol Use No     Social History   Substance and Sexual Activity  Drug Use Yes   Types: Marijuana   Comment: one year ago    Social History   Socioeconomic History   Marital status: Single    Spouse name: Not on file   Number of children: Not on file   Years of education: Not on file   Highest education level: Not on file  Occupational History   Not on file  Tobacco Use   Smoking status: Never   Smokeless tobacco: Current    Types: Chew  Vaping Use   Vaping status: Never Used  Substance and Sexual Activity   Alcohol use: No   Drug use: Yes    Types: Marijuana    Comment: one year ago   Sexual activity: Not  Currently  Other Topics Concern   Not on file  Social History Narrative   Not on file   Social Drivers of Health   Financial Resource Strain: Not on file  Food Insecurity: Patient Declined (08/16/2023)   Hunger Vital Sign    Worried About Running Out of Food in the Last Year: Patient declined    Ran Out of Food in the Last Year: Patient declined  Transportation Needs: Patient Declined (08/16/2023)   PRAPARE - Administrator, Civil Service (Medical): Patient declined    Lack of Transportation (Non-Medical): Patient declined   Physical Activity: Not on file  Stress: Not on file  Social Connections: Not on file   Additional Social History:                         Sleep: Poor due to roommate   Appetite:  Good   Current Medications: Current Facility-Administered Medications  Medication Dose Route Frequency Provider Last Rate Last Admin   acetaminophen  (TYLENOL ) tablet 650 mg  650 mg Oral Q4H PRN Mills, Shnese E, NP   650 mg at 08/25/23 1900   alum & mag hydroxide-simeth (MAALOX/MYLANTA) 200-200-20 MG/5ML suspension 30 mL  30 mL Oral Q6H PRN Mills, Shnese E, NP       divalproex  (DEPAKOTE  ER) 24 hr tablet 1,000 mg  1,000 mg Oral q12n4p Izediuno, Vincent A, MD   1,000 mg at 08/26/23 5409   gabapentin  (NEURONTIN ) capsule 200 mg  200 mg Oral TID Mills, Shnese E, NP   200 mg at 08/26/23 0756   haloperidol  (HALDOL ) tablet 20 mg  20 mg Oral QHS Shellia Hartl S, MD   20 mg at 08/25/23 2034   Or   haloperidol  lactate (HALDOL ) injection 10 mg  10 mg Intramuscular QHS Basilia Bosworth, MD       haloperidol  (HALDOL ) tablet 5 mg  5 mg Oral TID PRN Mills, Shnese E, NP   5 mg at 08/20/23 0745   haloperidol  lactate (HALDOL ) injection 10 mg  10 mg Intramuscular TID PRN Mills, Shnese E, NP       haloperidol  lactate (HALDOL ) injection 5 mg  5 mg Intramuscular TID PRN Mills, Shnese E, NP   5 mg at 08/21/23 1215   And   LORazepam  (ATIVAN ) injection 2 mg  2 mg Intramuscular TID PRN Mills, Shnese E, NP   2 mg at 08/21/23 1216   LORazepam  (ATIVAN ) tablet 1 mg  1 mg Oral TID PRN Izediuno, Vincent A, MD   1 mg at 08/26/23 0352   magnesium  hydroxide (MILK OF MAGNESIA) suspension 30 mL  30 mL Oral Daily PRN Mills, Shnese E, NP       magnesium  oxide (MAG-OX) tablet 400 mg  400 mg Oral QHS [name], [name], NP   400 mg at 08/25/23 2032   multivitamin with minerals tablet 1 tablet  1 tablet Oral Daily Carrion-Carrero, [name], MD   1 tablet at 08/26/23 0756   nicotine  (NICODERM CQ  - dosed in mg/24 hours) patch 14 mg  14  mg Transdermal Daily Attiah, Nadir, MD   14 mg at 08/26/23 0755   nicotine  polacrilex (NICORETTE ) gum 2 mg  2 mg Oral PRN Mills, Shnese E, NP   2 mg at 08/26/23 0238   ondansetron  (ZOFRAN ) tablet 4 mg  4 mg Oral Q8H PRN Mills, Shnese E, NP       traZODone  (DESYREL ) tablet 100 mg  100 mg Oral QHS  Baltazar Bonier, MD   100 mg at 08/25/23 2032    Lab Results:  No results found for this or any previous visit (from the past 48 hours).   Blood Alcohol level:  Lab Results  Component Value Date   [facility] <15 08/15/2023   ETH <10 05/17/2023    Metabolic Disorder Labs: Lab Results  Component Value Date   HGBA1C 4.9 05/17/2023   MPG 93.93 05/17/2023   MPG 93.93 09/29/2021   No results found for: "PROLACTIN" Lab Results  Component Value Date   CHOL 152 05/17/2023   TRIG 70 05/17/2023   HDL 78 05/17/2023   CHOLHDL 1.9 05/17/2023   VLDL 14 05/17/2023   LDLCALC 60 05/17/2023   LDLCALC 55 02/19/2020    Physical Findings: AIMS:  , ,  ,  ,    CIWA:    COWS:     Musculoskeletal: Strength & Muscle Tone: within normal limits Gait & Station: normal Patient leans: N/A  Psychiatric Specialty Exam:  Presentation  General Appearance:  Appropriate for Environment; Casual  Eye Contact: Good  Speech: Clear and Coherent; Normal Rate  Speech Volume: Normal  Handedness: Right   Mood and Affect  Mood: Anxious; Depressed  Affect: Constricted; Congruent   Thought Process  Thought Processes: Coherent; Goal Directed  Descriptions of Associations:Intact  Orientation:Full (Time, Place and Person)  Thought Content:Rumination  History of Schizophrenia/Schizoaffective disorder:No  Duration of Psychotic Symptoms:Less than six months  Hallucinations:Hallucinations: None  Ideas of Reference:Other (comment) (possible paranoia about roommate)  Suicidal Thoughts:Suicidal Thoughts: No  Homicidal Thoughts:Homicidal Thoughts: No   Sensorium  Memory: Immediate  Fair  Judgment: Intact  Insight: Present   Executive Functions  Concentration: Fair  Attention Span: Fair  Recall: Fair  Fund of Knowledge: Good  Language: Good   Psychomotor Activity  Psychomotor Activity: Psychomotor Activity: Normal   Assets  Assets: Communication Skills; Desire for Improvement; Physical Health; Resilience   Sleep  Sleep: Sleep: Poor (due to roommate) Number of Hours of Sleep: 5.5    Physical Exam: Physical Exam Vitals and nursing note reviewed.  Constitutional:      General: He is not in acute distress.    Appearance: Normal appearance. He is normal weight. He is not ill-appearing or toxic-appearing.  HENT:     Head: Normocephalic and atraumatic.  Pulmonary:     Effort: Pulmonary effort is normal.  Musculoskeletal:        General: Normal range of motion.  Neurological:     General: No focal deficit present.     Mental Status: He is alert.   Review of Systems  Respiratory:  Negative for cough and shortness of breath.   Cardiovascular:  Negative for chest pain.  Gastrointestinal:  Negative for abdominal pain, constipation, diarrhea, nausea and vomiting.  Neurological:  Negative for dizziness, weakness and headaches.  Psychiatric/Behavioral:  Positive for depression. Negative for hallucinations and suicidal ideas. The patient is nervous/anxious.    Blood pressure 124/87, pulse 76, temperature 97.8 F (36.6 C), temperature source Oral, resp. rate 18, height 5\' 11"  (1.803 m), weight 73.5 kg, SpO2 98%. Body mass index is 22.59 kg/m.   Treatment Plan Summary: Daily contact with patient to assess and evaluate symptoms and progress in treatment and Medication management  Ronald [name] is a 43 yr old male who presented on 5/2 to [facility] under IVC due to bizarre, aggressive behavior and hallucinations, he was admitted to [facility] on 5/4.  PPHx is significant for Schizoaffective Disorder, Bipolar Type, GAD,  PTSD, and Polysubstance Abuse  (Benzo, THC), and 5 Prior Psychiatric Hospitalizations (last- [facility] 05/2023).    Jacks did take his PO Haldol  last night and is showing signs of improvement.  He had a roommate placed in his room who is responding to internal stimuli and has increase his anxiety and possible paranoia significantly.  We will attempt to move him to a different room as this would be best.  We will not make any changes to his medications at this time.  We will continue to monitor.   Schizoaffective Disorder, Bipolar Type: -Continue Depakote  1000 mg BID for mood stability -Continue Haldol  20 mg QHS PO or 10 mg IM for psychosis and mood stability -Non Emergent Medications Against Objection Order placed by Dr. Zouve on 5/8 -Continue Gabapentin  200 mg TID for mood stability -Continue Agitation Protocol: Haldol /Ativan    Nicotine  Dependence: -Continue Nicotine  Patch 14 mg daily -Continue Nicotine  Gum 2 mg PRN   -Continue Mag-Ox 400 mg QHS -Continue Multivitamin daily -Continue Trazodone  100 mg QHS -Continue PRN's: Tylenol , Maalox, Milk of Magnesia   --  The risks/benefits/side-effects/alternatives to medications were discussed in detail with the patient and time was given for questions. The patient consents to medication trials.                -- Metabolic profile and EKG monitoring obtained while on an atypical antipsychotic (BMI: Lipid Panel: HbgA1c: QTc:)              -- Encouraged patient to participate in unit milieu and in scheduled group therapies     Safety and Monitoring:             -- Involuntary admission to inpatient psychiatric unit for safety, stabilization and treatment             -- Daily contact with patient to assess and evaluate symptoms and progress in treatment             -- Patient's case to be discussed in multi-disciplinary team meeting             -- Observation Level : q15 minute checks             -- Vital signs:  q12 hours             -- Precautions: suicide, elopement, and  assault  Discharge Planning:              -- Social work and case management to assist with discharge planning and identification of hospital follow-up needs prior to discharge             -- Estimated LOS: 2-4 more days             -- Discharge Concerns: Need to establish a safety plan; Medication compliance and effectiveness             -- Discharge Goals: Return home with outpatient referrals for mental health follow-up including medication management/psychotherapy]   Basilia Bosworth, MD 08/26/2023, 8:48 AM

## 2023-08-26 NOTE — Progress Notes (Signed)
 Pt noted making several calls in a short period of time. Pt noted to be argumentative on phone. Nurse discouraged pt from the phone for the time being. Pts mother then called concerned pt is making bizarre/aggressive/suicidal statements to her on the phone.

## 2023-08-26 NOTE — Progress Notes (Signed)
 Pt noted with circumstantial speech. Pt endorses increased anxiety this afternoon and received ativan . Pt reports worrying excessively about his mother, stating she is sick with cancer. Support and encouragement provided.

## 2023-08-26 NOTE — Plan of Care (Signed)
  Problem: Education: Goal: Emotional status will improve Outcome: Not Progressing; Pt redirected repeatedly, escalated Goal: Mental status will improve Outcome: Not Progressing; Pt declined redirection and argumentative   Problem: Coping: Goal: Ability to verbalize frustrations and anger appropriately will improve Outcome: Progressing; pt attempted to use coping strategy to listen to music   Problem: Safety: Goal: Periods of time without injury will increase Outcome: Progressing

## 2023-08-26 NOTE — Plan of Care (Signed)
  Problem: Activity: Goal: Interest or engagement in activities will improve Outcome: Progressing   Problem: Coping: Goal: Ability to verbalize frustrations and anger appropriately will improve Outcome: Progressing   Problem: Physical Regulation: Goal: Ability to maintain clinical measurements within normal limits will improve Outcome: Progressing   

## 2023-08-27 ENCOUNTER — Encounter (HOSPITAL_COMMUNITY): Payer: Self-pay

## 2023-08-27 MED ORDER — HALOPERIDOL 5 MG PO TABS
30.0000 mg | ORAL_TABLET | Freq: Every day | ORAL | Status: DC
  Administered 2023-08-27 – 2023-09-01 (×6): 30 mg via ORAL
  Filled 2023-08-27: qty 42
  Filled 2023-08-27 (×7): qty 6

## 2023-08-27 MED ORDER — HALOPERIDOL LACTATE 5 MG/ML IJ SOLN
15.0000 mg | Freq: Every day | INTRAMUSCULAR | Status: DC
  Filled 2023-08-27 (×7): qty 3

## 2023-08-27 NOTE — Plan of Care (Signed)
   Problem: Education: Goal: Knowledge of  General Education information/materials will improve Outcome: Progressing Goal: Emotional status will improve Outcome: Progressing Goal: Mental status will improve Outcome: Progressing Goal: Verbalization of understanding the information provided will improve Outcome: Progressing   Problem: Coping: Goal: Ability to verbalize frustrations and anger appropriately will improve Outcome: Progressing Goal: Ability to demonstrate self-control will improve Outcome: Progressing

## 2023-08-27 NOTE — Group Note (Signed)
 Date:  08/27/2023 Time:  1:29 PM  Group Topic/Focus:  Emotional Education:   The focus of this group is to discuss unhealthy thought patterns and how they impact mental health.     Participation Level:  Minimal  Participation Quality:  Appropriate  Affect:  Appropriate  Cognitive:  Appropriate  Insight: Appropriate  Engagement in Group:  Developing/Improving  Modes of Intervention:  Discussion and Education  Additional Comments:    Sheryl Donna 08/27/2023, 1:29 PM

## 2023-08-27 NOTE — Progress Notes (Signed)
   08/27/23 0500  Psych Admission Type (Psych Patients Only)  Admission Status Involuntary  Psychosocial Assessment  Patient Complaints Anxiety  Eye Contact Intense  Facial Expression Anxious  Affect Appropriate to circumstance  Speech Logical/coherent  Interaction Assertive  Appearance/Hygiene Improved  Behavior Characteristics Cooperative;Appropriate to situation  Mood Anxious  Thought Process  Coherency WDL  Content WDL  Delusions None reported or observed  Perception WDL  Hallucination None reported or observed  Judgment Impaired  Confusion None  Danger to Self  Current suicidal ideation? Denies  Self-Injurious Behavior No self-injurious ideation or behavior indicators observed or expressed   Agreement Not to Harm Self Yes  Description of Agreement Verbal  Danger to Others  Danger to Others None reported or observed

## 2023-08-27 NOTE — BH IP Treatment Plan (Signed)
 Interdisciplinary Treatment and Diagnostic Plan Update  08/27/2023 Time of Session: THIS IS AN UPDATE Ronald [name] MRN: [id number]  Principal Diagnosis: Schizoaffective disorder [facility])  Secondary Diagnoses: Principal Problem:   Schizoaffective disorder (HCC) Active Problems:   Substance induced mood disorder (HCC)   Current Medications:  Current Facility-Administered Medications  Medication Dose Route Frequency Provider Last Rate Last Admin   acetaminophen  (TYLENOL ) tablet 650 mg  650 mg Oral Q4H PRN Mills, Shnese E, NP   650 mg at 08/26/23 1610   alum & mag hydroxide-simeth (MAALOX/MYLANTA) 200-200-20 MG/5ML suspension 30 mL  30 mL Oral Q6H PRN Mills, Shnese E, NP       divalproex  (DEPAKOTE  ER) 24 hr tablet 1,000 mg  1,000 mg Oral q12n4p Izediuno, Iline Mallory, MD   1,000 mg at 08/27/23 4098   gabapentin  (NEURONTIN ) capsule 200 mg  200 mg Oral TID Mills, Shnese E, NP   200 mg at 08/27/23 1191   haloperidol  (HALDOL ) tablet 20 mg  20 mg Oral QHS Pashayan, Alexander S, MD   20 mg at 08/26/23 2056   Or   haloperidol  lactate (HALDOL ) injection 10 mg  10 mg Intramuscular QHS Pashayan, Alexander S, MD       haloperidol  (HALDOL ) tablet 5 mg  5 mg Oral TID PRN Mills, Shnese E, NP   5 mg at 08/20/23 0745   haloperidol  lactate (HALDOL ) injection 10 mg  10 mg Intramuscular TID PRN Mills, Shnese E, NP       haloperidol  lactate (HALDOL ) injection 5 mg  5 mg Intramuscular TID PRN Mills, Shnese E, NP   5 mg at 08/21/23 1215   And   LORazepam  (ATIVAN ) injection 2 mg  2 mg Intramuscular TID PRN Mills, Shnese E, NP   2 mg at 08/21/23 1216   LORazepam  (ATIVAN ) tablet 1 mg  1 mg Oral TID PRN Izediuno, Vincent A, MD   1 mg at 08/27/23 0324   magnesium  hydroxide (MILK OF MAGNESIA) suspension 30 mL  30 mL Oral Daily PRN Mills, Shnese E, NP       magnesium  oxide (MAG-OX) tablet 400 mg  400 mg Oral QHS [name], [name], NP   400 mg at 08/26/23 2057   multivitamin with minerals tablet 1 tablet  1 tablet Oral  Daily Carrion-Carrero, [name], MD   1 tablet at 08/27/23 4782   nicotine  (NICODERM CQ  - dosed in mg/24 hours) patch 14 mg  14 mg Transdermal Daily [name], Nadir, MD   14 mg at 08/27/23 9562   nicotine  polacrilex (NICORETTE ) gum 2 mg  2 mg Oral PRN Mills, Shnese E, NP   2 mg at 08/27/23 0809   ondansetron  (ZOFRAN ) tablet 4 mg  4 mg Oral Q8H PRN Mills, Shnese E, NP       traZODone  (DESYREL ) tablet 100 mg  100 mg Oral QHS Carrion-Carrero, [name], MD   100 mg at 08/26/23 2056   PTA Medications: Medications Prior to Admission  Medication Sig Dispense Refill Last Dose/Taking   divalproex  (DEPAKOTE  ER) 500 MG 24 hr tablet Take 1 tablet (500 mg total) by mouth 2 (two) times daily. 60 tablet 3    gabapentin  (NEURONTIN ) 100 MG capsule Take 2 capsules (200 mg total) by mouth 3 (three) times daily. 180 capsule 3    Ibuprofen  200 MG CAPS Take 800 mg by mouth every 6 (six) hours as needed (for headaches or pain).      LORazepam  (ATIVAN ) 1 MG tablet Take 1 mg by mouth every 8 (  eight) hours as needed for anxiety.      MAGNESIUM  OXIDE, ELEMENTAL, PO Take 500 mg by mouth at bedtime.      Melatonin 10 MG TABS Take 10 mg by mouth at bedtime. 30 tablet 3    Multiple Vitamin (MULTIVITAMIN WITH MINERALS) TABS tablet Take 1 tablet by mouth daily with breakfast.      nicotine  (NICODERM CQ  - DOSED IN MG/24 HOURS) 14 mg/24hr patch Place 1 patch (14 mg total) onto the skin daily. (Patient not taking: Reported on 08/15/2023) 28 patch 0    nicotine  (NICODERM CQ  - DOSED IN MG/24 HOURS) 21 mg/24hr patch Place 21 mg onto the skin daily.      nicotine  polacrilex (NICORETTE ) 2 MG gum Take 1 each (2 mg total) by mouth every 4 (four) hours as needed for smoking cessation (while awake). (Patient taking differently: Take 2 mg by mouth every 2 (two) hours as needed for smoking cessation (while awake).) 100 tablet 0    Nutritional Supplements (ENSURE ORIGINAL) LIQD Take 237 mLs by mouth daily.      QUEtiapine  (SEROQUEL ) 50 MG tablet  Take 1 tablet (50 mg total) by mouth at bedtime. (Patient not taking: Reported on 08/15/2023) 7 tablet 0    traZODone  (DESYREL ) 50 MG tablet Take 1 tablet (50 mg total) by mouth at bedtime. 30 tablet 3     Patient Stressors: Financial difficulties   Medication change or noncompliance    Patient Strengths: Ability for Contractor for treatment/growth  Supportive family/friends   Treatment Modalities: Medication Management, Group therapy, Case management,  1 to 1 session with clinician, Psychoeducation, Recreational therapy.   Physician Treatment Plan for Primary Diagnosis: Schizoaffective disorder (HCC) Long Term Goal(s): Improvement in symptoms so as ready for discharge   Short Term Goals: Ability to identify changes in lifestyle to reduce recurrence of condition will improve Ability to verbalize feelings will improve Ability to disclose and discuss suicidal ideas Ability to demonstrate self-control will improve  Medication Management: Evaluate patient's response, side effects, and tolerance of medication regimen.  Therapeutic Interventions: 1 to 1 sessions, Unit Group sessions and Medication administration.  Evaluation of Outcomes: Progressing  Physician Treatment Plan for Secondary Diagnosis: Principal Problem:   Schizoaffective disorder (HCC) Active Problems:   Substance induced mood disorder (HCC)  Long Term Goal(s): Improvement in symptoms so as ready for discharge   Short Term Goals: Ability to identify changes in lifestyle to reduce recurrence of condition will improve Ability to verbalize feelings will improve Ability to disclose and discuss suicidal ideas Ability to demonstrate self-control will improve     Medication Management: Evaluate patient's response, side effects, and tolerance of medication regimen.  Therapeutic Interventions: 1 to 1 sessions, Unit Group sessions and Medication administration.  Evaluation of Outcomes:  Progressing   RN Treatment Plan for Primary Diagnosis: Schizoaffective disorder (HCC) Long Term Goal(s): Knowledge of disease and therapeutic regimen to maintain health will improve  Short Term Goals: Ability to demonstrate self-control, Ability to participate in decision making will improve, Ability to verbalize feelings will improve, and Ability to identify and develop effective coping behaviors will improve  Medication Management: RN will administer medications as ordered by provider, will assess and evaluate patient's response and provide education to patient for prescribed medication. RN will report any adverse and/or side effects to prescribing provider.  Therapeutic Interventions: 1 on 1 counseling sessions, Psychoeducation, Medication administration, Evaluate responses to treatment, Monitor vital signs and CBGs as ordered, Perform/monitor CIWA, COWS,  AIMS and Fall Risk screenings as ordered, Perform wound care treatments as ordered.  Evaluation of Outcomes: Progressing   LCSW Treatment Plan for Primary Diagnosis: Schizoaffective disorder (HCC) Long Term Goal(s): Safe transition to appropriate next level of care at discharge, Engage patient in therapeutic group addressing interpersonal concerns.  Short Term Goals: Engage patient in aftercare planning with referrals and resources, Increase social support, Increase ability to appropriately verbalize feelings, Increase emotional regulation, and Increase skills for wellness and recovery  Therapeutic Interventions: Assess for all discharge needs, 1 to 1 time with Social worker, Explore available resources and support systems, Assess for adequacy in community support network, Educate family and significant other(s) on suicide prevention, Complete Psychosocial Assessment, Interpersonal group therapy.  Evaluation of Outcomes: Progressing   Progress in Treatment: Attending groups: Yes. Participating in groups: Yes. Taking medication as  prescribed: Yes. Toleration medication: Yes. Family/Significant other contact made: Yes, individual(s) contacted:  Consuella Denis [phone] Patient understands diagnosis: Yes. Discussing patient identified problems/goals with staff: Yes. Medical problems stabilized or resolved: Yes. Denies suicidal/homicidal ideation: Yes. Issues/concerns per patient self-inventory: No. Other: n/a   New problem(s) identified: No, Describe:  none   New Short Term/Long Term Goal(s): medication stabilization, elimination of SI thoughts, development of comprehensive mental wellness plan.    Patient Goals:  "Medication regulation and coping skills"   Discharge Plan or Barriers: Patient recently admitted. CSW will continue to follow and assess for appropriate referrals and possible discharge planning.     Reason for Continuation of Hospitalization: Anxiety Mania Medication stabilization Other; describe mood stabilization, discharge planning   Estimated Length of Stay: 4-5 DAYS  Last 3 [place] Suicide Severity Risk Score: Flowsheet Row Ad[facility] (Current) [name] from [facility]14/2023 [facility] from [facility]/26/2023 in [facility] at [facility]  C-SSRS RISK CATEGORY No Risk No Risk No Risk       Last PHQ 2/9 Scores:    06/13/2023    8:06 AM 11/08/2022   10:00 AM 01/14/2022    1:09 PM  Depression screen PHQ 2/9  Decreased Interest 0 0 0  Down, Depressed, Hopeless 0 0 0  PHQ - 2 Score 0 0 0  Altered sleeping 0 0 0  Tired, decreased energy 0 0 0  Change in appetite 0 0 0  Feeling bad or failure about yourself  0 0 0  Trouble concentrating 0 0 0  Moving slowly or fidgety/restless 0 0 0  Suicidal thoughts 0 0 0  PHQ-9 Score 0 0 0  Difficult doing work/chores Not difficult at all      Scribe for Treatment Team: Vonzell Guerin, LCSWA 08/27/2023 9:09 AM

## 2023-08-27 NOTE — Group Note (Signed)
 Recreation Therapy Group Note   Group Topic:Communication  Group Date: 08/27/2023 Start Time: 0933 End Time: 1013 Facilitators: Kaelin Holford-McCall, LRT,CTRS Location: [address] Dayroom   Group Topic: Communication, Problem Solving   Goal Area(s) Addresses:  Patient will effectively listen to complete activity.  Patient will identify communication skills used to make activity successful.  Patient will identify how skills used during activity can be used to reach post d/c goals.    Behavioral Response: Minimal   Intervention: Building surveyor Activity - Geometric pattern cards, pencils, blank paper    Activity: Geometric Drawings.  Three volunteers from the peer group will be shown an abstract picture with a particular arrangement of geometrical shapes.  Each round, one 'speaker' will describe the pattern, as accurately as possible without revealing the image to the group.  The remaining group members will listen and draw the picture to reflect how it is described to them. Patients with the role of 'listener' cannot ask clarifying questions but, may request that the speaker repeat a direction. Once the drawings are complete, the presenter will show the rest of the group the picture and compare how close each person came to drawing the picture. LRT will facilitate a post-activity discussion regarding effective communication and the importance of planning, listening, and asking for clarification in daily interactions with others.  Education: Environmental consultant, Active listening, Support systems, Discharge planning  Education Outcome: Acknowledges understanding/In group clarification offered/Needs additional education.    Affect/Mood: Appropriate   Participation Level: Minimal   Participation Quality: Independent   Behavior: Appropriate and Attentive    Speech/Thought Process: Relevant   Insight: Fair   Judgement: Fair    Modes of Intervention: Activity   Patient  Response to Interventions:  Receptive   Education Outcome:  In group clarification offered    Clinical Observations/Individualized Feedback: Pt was late getting to group but joined in once he settled in. Pt was called out of group as soon as he was about engage in the activity. Pt returned as group was coming to a close.    Plan: Continue to engage patient in RT group sessions 2-3x/week.   Ronald [name], LRT,CTRS 08/27/2023 12:57 PM

## 2023-08-27 NOTE — Progress Notes (Signed)
 [facility] MD Progress Note  08/27/2023 11:08 AM MATTTHEW [name]  MRN:  [id number] Subjective:   Ronald [name] is a 43 yr old male who presented on 5/2 to [facility] under IVC due to bizarre, aggressive behavior and hallucinations, he was admitted to [facility] on 5/4.  PPHx is significant for Schizoaffective Disorder, Bipolar Type, GAD, PTSD, and Polysubstance Abuse (Benzo, THC), and 5 Prior Psychiatric Hospitalizations (last- [facility] 05/2023).  Case was discussed in the multidisciplinary team. MAR was reviewed and patient was compliant with medications.  He received PRN Tylenol  and Ativan  yesterday.   Psychiatric Team made the following recommendations yesterday: -Continue Depakote  1000 mg BID for mood stability -Continue Haldol  20 mg QHS PO or 10 mg IM for psychosis and mood stability    On interview today patient reports he slept good last night.  He reports his appetite is doing good.  He reports no SI, HI, or AVH.  He reports no Paranoia or Ideas of Reference.  He reports no issues with his medications.  He reports that he is doing much better today now that he was moved and does not have a roommate.  Discussed calling his mother and he was agreeable.  He reports no other concerns at present.   Called patient's mother, Ronald [name], [phone].  She reports that he has had issues since 09-03-09 when his brother died.  She reports he would have episodes about every 2 years.  She reports that recently though he has had significant worsening and had 3 episodes in the last few months.  She reports with this episode he has been move aggressive towards her which is new.  She reports that her yelled at her "You're mine now bitch."  She reports that when he calls he tells her to tell the nurses her does need his Haldol .  She reports concern over him continuing to take his medication after discharge as he usually only continues taking the Depakote  and gabapentin .  She reports no other concerns at present.    Principal  Problem: Schizoaffective disorder (HCC) Diagnosis: Principal Problem:   Schizoaffective disorder (HCC) Active Problems:   Substance induced mood disorder (HCC)  Total Time spent with patient:  I personally spent 35 minutes on the unit in direct patient care. The direct patient care time included face-to-face time with the patient, reviewing the patient's chart, communicating with other professionals, and coordinating care. Greater than 50% of this time was spent in counseling or coordinating care with the patient regarding goals of hospitalization, psycho-education, and discharge planning needs.   Past Psychiatric History:  Schizoaffective Disorder, Bipolar Type, GAD, PTSD, and Polysubstance Abuse (Benzo, THC), and 5 Prior Psychiatric Hospitalizations (last- [facility] 05/2023).  Past Medical History:  Past Medical History:  Diagnosis Date   Anxiety    Arthritis    Disorder of pineal gland    Post traumatic stress disorder     Past Surgical History:  Procedure Laterality Date   ANKLE SURGERY     Family History: History reviewed. No pertinent family history. Family Psychiatric  History:  No Known  Social History:  Social History   Substance and Sexual Activity  Alcohol Use No     Social History   Substance and Sexual Activity  Drug Use Yes   Types: Marijuana   Comment: one year ago    Social History   Socioeconomic History   Marital status: Single    Spouse name: Not on file   Number of children: Not on file  Years of education: Not on file   Highest education level: Not on file  Occupational History   Not on file  Tobacco Use   Smoking status: Never   Smokeless tobacco: Current    Types: Chew  Vaping Use   Vaping status: Never Used  Substance and Sexual Activity   Alcohol use: No   Drug use: Yes    Types: Marijuana    Comment: one year ago   Sexual activity: Not Currently  Other Topics Concern   Not on file  Social History Narrative   Not on file   Social  Drivers of Health   Financial Resource Strain: Not on file  Food Insecurity: Patient Declined (08/16/2023)   Hunger Vital Sign    Worried About Running Out of Food in the Last Year: Patient declined    Ran Out of Food in the Last Year: Patient declined  Transportation Needs: Patient Declined (08/16/2023)   PRAPARE - Administrator, Civil Service (Medical): Patient declined    Lack of Transportation (Non-Medical): Patient declined  Physical Activity: Not on file  Stress: Not on file  Social Connections: Not on file   Additional Social History:                         Sleep: Good   Appetite:  Good   Current Medications: Current Facility-Administered Medications  Medication Dose Route Frequency Provider Last Rate Last Admin   acetaminophen  (TYLENOL ) tablet 650 mg  650 mg Oral Q4H PRN Mills, Shnese E, NP   650 mg at 08/26/23 1610   alum & mag hydroxide-simeth (MAALOX/MYLANTA) 200-200-20 MG/5ML suspension 30 mL  30 mL Oral Q6H PRN Mills, Shnese E, NP       divalproex  (DEPAKOTE  ER) 24 hr tablet 1,000 mg  1,000 mg Oral q12n4p Izediuno, Vincent A, MD   1,000 mg at 08/27/23 1610   gabapentin  (NEURONTIN ) capsule 200 mg  200 mg Oral TID Mills, Shnese E, NP   200 mg at 08/27/23 9604   haloperidol  (HALDOL ) tablet 30 mg  30 mg Oral QHS Rylynn Kobs S, MD       Or   haloperidol  lactate (HALDOL ) injection 15 mg  15 mg Intramuscular QHS Shanti Eichel S, MD       haloperidol  (HALDOL ) tablet 5 mg  5 mg Oral TID PRN Mills, Shnese E, NP   5 mg at 08/20/23 0745   haloperidol  lactate (HALDOL ) injection 10 mg  10 mg Intramuscular TID PRN Mills, Shnese E, NP       haloperidol  lactate (HALDOL ) injection 5 mg  5 mg Intramuscular TID PRN Mills, Shnese E, NP   5 mg at 08/21/23 1215   And   LORazepam  (ATIVAN ) injection 2 mg  2 mg Intramuscular TID PRN Mills, Shnese E, NP   2 mg at 08/21/23 1216   LORazepam  (ATIVAN ) tablet 1 mg  1 mg Oral TID PRN Izediuno, Vincent A, MD   1 mg at  08/27/23 0324   magnesium  hydroxide (MILK OF MAGNESIA) suspension 30 mL  30 mL Oral Daily PRN Mills, Shnese E, NP       magnesium  oxide (MAG-OX) tablet 400 mg  400 mg Oral QHS [name], [name], NP   400 mg at 08/26/23 2057   multivitamin with minerals tablet 1 tablet  1 tablet Oral Daily Baltazar Bonier, MD   1 tablet at 08/27/23 0807   nicotine  (NICODERM CQ  - dosed in mg/24 hours)  patch 14 mg  14 mg Transdermal Daily Attiah, Nadir, MD   14 mg at 08/27/23 0807   nicotine  polacrilex (NICORETTE ) gum 2 mg  2 mg Oral PRN Mills, Shnese E, NP   2 mg at 08/27/23 0809   ondansetron  (ZOFRAN ) tablet 4 mg  4 mg Oral Q8H PRN Mills, Shnese E, NP       traZODone  (DESYREL ) tablet 100 mg  100 mg Oral QHS Carrion-Carrero, [name], MD   100 mg at 08/26/23 2056    Lab Results:  No results found for this or any previous visit (from the past 48 hours).   Blood Alcohol level:  Lab Results  Component Value Date   [facility] <15 08/15/2023   ETH <10 05/17/2023    Metabolic Disorder Labs: Lab Results  Component Value Date   HGBA1C 4.9 05/17/2023   MPG 93.93 05/17/2023   MPG 93.93 09/29/2021   No results found for: "PROLACTIN" Lab Results  Component Value Date   CHOL 152 05/17/2023   TRIG 70 05/17/2023   HDL 78 05/17/2023   CHOLHDL 1.9 05/17/2023   VLDL 14 05/17/2023   LDLCALC 60 05/17/2023   LDLCALC 55 02/19/2020    Physical Findings: AIMS:  , ,  ,  ,    CIWA:    COWS:     Musculoskeletal: Strength & Muscle Tone: within normal limits Gait & Station: normal Patient leans: N/A  Psychiatric Specialty Exam:  Presentation  General Appearance:  Appropriate for Environment; Casual  Eye Contact: Good  Speech: Clear and Coherent; Normal Rate  Speech Volume: Normal  Handedness: Right   Mood and Affect  Mood: Dysphoric  Affect: Congruent   Thought Process  Thought Processes: Coherent; Goal Directed  Descriptions of Associations:Intact  Orientation:Full (Time, Place  and Person)  Thought Content:Rumination  History of Schizophrenia/Schizoaffective disorder:No  Duration of Psychotic Symptoms:Less than six months  Hallucinations:Hallucinations: None  Ideas of Reference:None  Suicidal Thoughts:Suicidal Thoughts: No  Homicidal Thoughts:Homicidal Thoughts: No   Sensorium  Memory: Immediate Fair  Judgment: Intact  Insight: Present   Executive Functions  Concentration: Fair  Attention Span: Fair  Recall: Fair  Fund of Knowledge: Good  Language: Good   Psychomotor Activity  Psychomotor Activity: Psychomotor Activity: Normal   Assets  Assets: Communication Skills; Desire for Improvement; Physical Health; Resilience   Sleep  Sleep: Sleep: Good Number of Hours of Sleep: 7.25    Physical Exam: Physical Exam Vitals and nursing note reviewed.  Constitutional:      General: He is not in acute distress.    Appearance: Normal appearance. He is normal weight. He is not ill-appearing or toxic-appearing.  HENT:     Head: Normocephalic and atraumatic.  Pulmonary:     Effort: Pulmonary effort is normal.  Musculoskeletal:        General: Normal range of motion.  Neurological:     General: No focal deficit present.     Mental Status: He is alert.    Review of Systems  Respiratory:  Negative for cough and shortness of breath.   Cardiovascular:  Negative for chest pain.  Gastrointestinal:  Negative for abdominal pain, constipation, diarrhea, nausea and vomiting.  Neurological:  Negative for dizziness, weakness and headaches.  Psychiatric/Behavioral:  Negative for depression, hallucinations and suicidal ideas. The patient is nervous/anxious.    Blood pressure (!) 123/92, pulse 72, temperature 98.2 F (36.8 C), temperature source Oral, resp. rate 17, height 5\' 11"  (1.803 m), weight 73.5 kg, SpO2 99%. Body mass index is  22.59 kg/m.   Treatment Plan Summary: Daily contact with patient to assess and evaluate symptoms  and progress in treatment and Medication management  KOLAWOLE KIMBALL is a 43 yr old male who presented on 5/2 to [facility] under IVC due to bizarre, aggressive behavior and hallucinations, he was admitted to [facility] on 5/4.  PPHx is significant for Schizoaffective Disorder, Bipolar Type, GAD, PTSD, and Polysubstance Abuse (Benzo, THC), and 5 Prior Psychiatric Hospitalizations (last- [facility] 05/2023).    Ayiden is minimizing his symptoms but continues to be aggressive towards his mother.  We will increase his Haldol  since he has had some improvement as evidenced by his ability to minimize his symptoms.  If he continues to have aggression we may need to consider switching to another agent.  We will not make any other changes to his medications at this time.  We will check a Depakote  lvl, CMP, and CBC tomorrow morning.  We will obtain an EKG tomorrow morning.  We will continue to monitor.   Schizoaffective Disorder, Bipolar Type: -Continue Depakote  1000 mg BID for mood stability -Increase Haldol  30 mg QHS PO or 15 mg IM for psychosis and mood stability -Non Emergent Medications Against Objection Order placed by Dr. Zouve on 5/8 -Continue Gabapentin  200 mg TID for mood stability -Continue Agitation Protocol: Haldol /Ativan    Nicotine  Dependence: -Continue Nicotine  Patch 14 mg daily -Continue Nicotine  Gum 2 mg PRN   -Continue Mag-Ox 400 mg QHS -Continue Multivitamin daily -Continue Trazodone  100 mg QHS -Continue PRN's: Tylenol , Maalox, Milk of Magnesia   --  The risks/benefits/side-effects/alternatives to medications were discussed in detail with the patient and time was given for questions. The patient consents to medication trials.                -- Metabolic profile and EKG monitoring obtained while on an atypical antipsychotic (BMI: Lipid Panel: HbgA1c: QTc:)              -- Encouraged patient to participate in unit milieu and in scheduled group therapies     Safety and Monitoring:             --  Involuntary admission to inpatient psychiatric unit for safety, stabilization and treatment             -- Daily contact with patient to assess and evaluate symptoms and progress in treatment             -- Patient's case to be discussed in multi-disciplinary team meeting             -- Observation Level : q15 minute checks             -- Vital signs:  q12 hours             -- Precautions: suicide, elopement, and assault  Discharge Planning:              -- Social work and case management to assist with discharge planning and identification of hospital follow-up needs prior to discharge             -- Estimated LOS: 2-4 more days             -- Discharge Concerns: Need to establish a safety plan; Medication compliance and effectiveness             -- Discharge Goals: Return home with outpatient referrals for mental health follow-up including medication management/psychotherapy]   Basilia Bosworth, MD 08/27/2023, 11:08 AM

## 2023-08-27 NOTE — B[facility] Group Notes (Signed)
 Spirituality Group   Focus of discussion: Gratitude and Strength Awareness   Process: Following theoretical framework of group therapy of Irvin Yalom and further informed by Rogerian and Relational Cultural Theory approaches, participants invited to name:   Sources of gratitude (internal>external)   Articulate gratitude for self   Name a personal strength/gift/skill   Locate points of resonance among group members/engage the "here and now"   Conclude with grounding/breathwork  Observations: Ronald [name] was reserved but still an active participant in the group discussion.  [name] L. [name], M.Div [phone]

## 2023-08-27 NOTE — BHH Group Notes (Signed)
Patient attended the NA group. ?

## 2023-08-27 NOTE — Plan of Care (Signed)
  Problem: Education: Goal: Emotional status will improve Outcome: Progressing Goal: Verbalization of understanding the information provided will improve Outcome: Progressing   Problem: Activity: Goal: Sleeping patterns will improve Outcome: Progressing   Problem: Health Behavior/Discharge Planning: Goal: Compliance with treatment plan for underlying cause of condition will improve Outcome: Progressing

## 2023-08-27 NOTE — Progress Notes (Signed)
 Patient approached this Clinical research associate and said "I'm having a synthetic panic attack from that haldol ." Patient appeared anxious and with the potential to escalate irritability. Lorazepam  1mg  given at 1827.

## 2023-08-27 NOTE — Progress Notes (Signed)
   08/27/23 1000  Psych Admission Type (Psych Patients Only)  Admission Status Involuntary  Psychosocial Assessment  Patient Complaints Anxiety  Eye Contact Intense  Facial Expression Anxious  Affect Anxious  Speech Logical/coherent  Interaction Assertive;Demanding  Motor Activity Other (Comment) (WNL)  Appearance/Hygiene Body odor  Behavior Characteristics Cooperative;Anxious  Mood Anxious  Thought Process  Coherency WDL  Content Blaming others  Delusions None reported or observed  Perception WDL  Hallucination None reported or observed  Judgment Impaired  Confusion None  Danger to Self  Current suicidal ideation? Denies  Self-Injurious Behavior No self-injurious ideation or behavior indicators observed or expressed   Agreement Not to Harm Self Yes  Description of Agreement verbal  Danger to Others  Danger to Others None reported or observed

## 2023-08-27 NOTE — Group Note (Signed)
 Date:  08/27/2023 Time:  9:10 AM  Group Topic/Focus:  Goals Group:   The focus of this group is to help patients establish daily goals to achieve during treatment and discuss how the patient can incorporate goal setting into their daily lives to aide in recovery. Orientation:   The focus of this group is to educate the patient on the purpose and policies of crisis stabilization and provide a format to answer questions about their admission.  The group details unit policies and expectations of patients while admitted.    Participation Level:  Active  Participation Quality:  Appropriate  Affect:  Appropriate  Cognitive:  Appropriate  Insight: Appropriate  Engagement in Group:  Engaged  Modes of Intervention:  Discussion and Orientation  Additional Comments:    Ronald [name] D Rylei Masella 08/27/2023, 9:10 AM

## 2023-08-28 LAB — CBC
HCT: 43.6 % (ref 39.0–52.0)
Hemoglobin: 14.5 g/dL (ref 13.0–17.0)
MCH: 30.3 pg (ref 26.0–34.0)
MCHC: 33.3 g/dL (ref 30.0–36.0)
MCV: 91.2 fL (ref 80.0–100.0)
Platelets: 225 10*3/uL (ref 150–400)
RBC: 4.78 MIL/uL (ref 4.22–5.81)
RDW: 12.7 % (ref 11.5–15.5)
WBC: 6.8 10*3/uL (ref 4.0–10.5)
nRBC: 0 % (ref 0.0–0.2)

## 2023-08-28 LAB — COMPREHENSIVE METABOLIC PANEL WITH GFR
ALT: 14 U/L (ref 0–44)
AST: 15 U/L (ref 15–41)
Albumin: 3.9 g/dL (ref 3.5–5.0)
Alkaline Phosphatase: 50 U/L (ref 38–126)
Anion gap: 10 (ref 5–15)
BUN: 13 mg/dL (ref 6–20)
CO2: 27 mmol/L (ref 22–32)
Calcium: 8.7 mg/dL — ABNORMAL LOW (ref 8.9–10.3)
Chloride: 99 mmol/L (ref 98–111)
Creatinine, Ser: 0.9 mg/dL (ref 0.61–1.24)
GFR, Estimated: >60 mL/min (ref >60)
Glucose, Bld: 113 mg/dL — ABNORMAL HIGH (ref 70–99)
Potassium: 3.9 mmol/L (ref 3.5–5.1)
Sodium: 136 mmol/L (ref 135–145)
Total Bilirubin: 0.3 mg/dL (ref 0.0–1.2)
Total Protein: 6.6 g/dL (ref 6.5–8.1)

## 2023-08-28 LAB — VALPROIC ACID LEVEL: Valproic Acid Lvl: 71 ug/mL (ref 50–100)

## 2023-08-28 MED ORDER — LORAZEPAM 1 MG PO TABS
1.0000 mg | ORAL_TABLET | Freq: Two times a day (BID) | ORAL | Status: DC | PRN
Start: 2023-08-28 — End: 2023-08-31
  Administered 2023-08-28 – 2023-08-31 (×6): 1 mg via ORAL
  Filled 2023-08-28 (×8): qty 1

## 2023-08-28 NOTE — B[facility] Group Notes (Signed)
 The focus of this group is to help patients establish daily goals to achieve during treatment and discuss how the patient can incorporate goal setting into their daily lives to aide in recovery.        Scale 1-10 10    Goal: work on Pharmacologist and medication regulation

## 2023-08-28 NOTE — Plan of Care (Signed)
   Problem: Education: Goal: Emotional status will improve Outcome: Progressing Goal: Mental status will improve Outcome: Progressing   Problem: Activity: Goal: Interest or engagement in activities will improve Outcome: Progressing

## 2023-08-28 NOTE — Progress Notes (Signed)
   08/28/23 2200  Psych Admission Type (Psych Patients Only)  Admission Status Involuntary  Psychosocial Assessment  Patient Complaints Anxiety  Eye Contact Fair  Facial Expression Anxious  Affect Anxious  Speech Logical/coherent  Interaction Assertive  Motor Activity Slow  Appearance/Hygiene Body odor  Behavior Characteristics Cooperative;Anxious  Mood Anxious;Pleasant  Aggressive Behavior  Effect No apparent injury  Thought Process  Coherency WDL  Content WDL  Delusions WDL  Perception WDL  Hallucination None reported or observed  Judgment Impaired  Confusion None  Danger to Self  Current suicidal ideation? Denies  Danger to Others  Danger to Others None reported or observed

## 2023-08-28 NOTE — Plan of Care (Signed)
  Problem: Education: Goal: Emotional status will improve Outcome: Progressing Goal: Verbalization of understanding the information provided will improve Outcome: Progressing   Problem: Activity: Goal: Sleeping patterns will improve Outcome: Progressing   Problem: Coping: Goal: Ability to demonstrate self-control will improve Outcome: Progressing   Problem: Health Behavior/Discharge Planning: Goal: Compliance with treatment plan for underlying cause of condition will improve Outcome: Progressing

## 2023-08-28 NOTE — Progress Notes (Signed)
   08/28/23 0000  Psych Admission Type (Psych Patients Only)  Admission Status Involuntary  Psychosocial Assessment  Patient Complaints Anxiety  Eye Contact Fair  Facial Expression Anxious  Affect Anxious  Speech Logical/coherent  Interaction Assertive  Motor Activity Slow  Appearance/Hygiene Body odor  Behavior Characteristics Cooperative  Mood Anxious  Aggressive Behavior  Effect No apparent injury  Thought Process  Coherency WDL  Content WDL  Delusions WDL  Perception WDL  Hallucination None reported or observed  Judgment Impaired  Confusion None  Danger to Self  Current suicidal ideation? Denies  Danger to Others  Danger to Others None reported or observed

## 2023-08-28 NOTE — Group Note (Signed)
 LCSW Group Therapy Note   Group Date: 08/28/2023 Start Time: 1100 End Time: 1200  Participation:  patient was present.  He listened and was respectful, however didn't participate in the conversation.   Type of Therapy:  Group Therapy  Topic:  Understanding your Path to Change   Objective:  The goal is to help individuals understand the stages of change, identify where they currently are in the process, and provide actionable next steps to continue moving forward in their journey of change.  Goals: - Learn about the six stages of change:  Precontemplation, Contemplation, Preparation, Action, Maintenance, and Relapse - Reflect on Current Change Efforts:  Recognize which stage participants are in regarding a personal change. -  Plan Next Steps for Moving Forward:  Create an action plan based on their current stage of change.  Class Summary:  In this session, we explored the Stages of Change as a framework to understand the process of change.  We discussed how each stage helps individuals recognize where they are in their personal journey and used the Stages of Change Worksheet for self-reflection. Participants answered questions to better understand their current stage, challenges, and progress. We also emphasized the importance of moving forward, even if setbacks (Relapse) occur, and created actionable steps to help participants continue progressing. By the end of the session, participants gained a clearer understanding of their path to change and left with a clear plan for next steps.  Modalities:  Elements of CBT (cognitive restructuring, problem solving)  Element of DBT (mindfulness, distress tolerance)   Berit Raczkowski O Marionette Meskill, LCSWA 08/28/2023  3:41 PM

## 2023-08-28 NOTE — Plan of Care (Signed)
   Problem: Education: Goal: Emotional status will improve Outcome: Progressing Goal: Mental status will improve Outcome: Progressing   Problem: Safety: Goal: Periods of time without injury will increase Outcome: Progressing

## 2023-08-28 NOTE — [facility] Group Notes (Signed)

## 2023-08-28 NOTE — Progress Notes (Signed)
   08/28/23 0900  Psych Admission Type (Psych Patients Only)  Admission Status Involuntary  Psychosocial Assessment  Patient Complaints None  Eye Contact Fair  Facial Expression Flat  Affect Flat  Speech Logical/coherent  Interaction Assertive  Motor Activity Other (Comment) (WNL)  Appearance/Hygiene Body odor  Behavior Characteristics Cooperative  Mood Pleasant  Thought Process  Coherency WDL  Content WDL  Delusions None reported or observed  Perception WDL  Hallucination None reported or observed  Judgment Limited  Confusion None  Danger to Self  Current suicidal ideation? Denies  Agreement Not to Harm Self Yes  Description of Agreement verbal  Danger to Others  Danger to Others None reported or observed

## 2023-08-28 NOTE — Progress Notes (Signed)
 [facility] MD Progress Note  08/28/2023 1:43 PM Ronald [name]  MRN:  [id number] Subjective:   Ronald [name] is a 43 yr old male who presented on 5/2 to [facility] under IVC due to bizarre, aggressive behavior and hallucinations, Ronald [name] was admitted to [facility] on 5/4.  PPHx is significant for Schizoaffective Disorder, Bipolar Type, GAD, PTSD, and Polysubstance Abuse (Benzo, THC), and 5 Prior Psychiatric Hospitalizations (last- [facility] 05/2023).  Case was discussed in the multidisciplinary team. MAR was reviewed and patient was compliant with medications.  Ronald [name] received PRN Tylenol  and Ativan  X2 yesterday.   Psychiatric Team made the following recommendations yesterday: -Continue Depakote  1000 mg BID for mood stability -Increase Haldol  30 mg QHS PO or 15 mg IM for psychosis and mood stability    On interview today patient reports Ronald [name] slept good last night.  Ronald [name] reports his appetite is doing good.  Ronald [name] reports no SI, HI, or AVH.  Ronald [name] reports no Paranoia or Ideas of Reference.  Ronald [name] reports initially having issues with his medications stating the Haldol  is causing him to have panic attacks.  However, when discussing switching his medication Ronald [name] then states his medication is fine and Ronald [name] is having no issues.  Ronald [name] states that Ronald [name] needs to be discharged so Ronald [name] can take care of his mother.  Ronald [name] reports multiple times that she has cancer and needs him.  Ronald [name] reports no other concerns at present.    Principal Problem: Schizoaffective disorder (HCC) Diagnosis: Principal Problem:   Schizoaffective disorder (HCC) Active Problems:   Substance induced mood disorder (HCC)  Total Time spent with patient:  I personally spent 35 minutes on the unit in direct patient care. The direct patient care time included face-to-face time with the patient, reviewing the patient's chart, communicating with other professionals, and coordinating care. Greater than 50% of this time was spent in counseling or coordinating care with the patient regarding goals  of hospitalization, psycho-education, and discharge planning needs.   Past Psychiatric History:  Schizoaffective Disorder, Bipolar Type, GAD, PTSD, and Polysubstance Abuse (Benzo, THC), and 5 Prior Psychiatric Hospitalizations (last- [facility] 05/2023).  Past Medical History:  Past Medical History:  Diagnosis Date   Anxiety    Arthritis    Disorder of pineal gland    Post traumatic stress disorder     Past Surgical History:  Procedure Laterality Date   ANKLE SURGERY     Family History: History reviewed. No pertinent family history. Family Psychiatric  History:  No Known  Social History:  Social History   Substance and Sexual Activity  Alcohol Use No     Social History   Substance and Sexual Activity  Drug Use Yes   Types: Marijuana   Comment: one year ago    Social History   Socioeconomic History   Marital status: Single    Spouse name: Not on file   Number of children: Not on file   Years of education: Not on file   Highest education level: Not on file  Occupational History   Not on file  Tobacco Use   Smoking status: Never   Smokeless tobacco: Current    Types: Chew  Vaping Use   Vaping status: Never Used  Substance and Sexual Activity   Alcohol use: No   Drug use: Yes    Types: Marijuana    Comment: one year ago   Sexual activity: Not Currently  Other Topics Concern   Not on file  Social History Narrative   Not  on file   Social Drivers of Health   Financial Resource Strain: Not on file  Food Insecurity: Patient Declined (08/16/2023)   Hunger Vital Sign    Worried About Running Out of Food in the Last Year: Patient declined    Ran Out of Food in the Last Year: Patient declined  Transportation Needs: Patient Declined (08/16/2023)   PRAPARE - Administrator, Civil Service (Medical): Patient declined    Lack of Transportation (Non-Medical): Patient declined  Physical Activity: Not on file  Stress: Not on file  Social Connections: Not on file    Additional Social History:                         Sleep: Good   Appetite:  Good   Current Medications: Current Facility-Administered Medications  Medication Dose Route Frequency Provider Last Rate Last Admin   acetaminophen  (TYLENOL ) tablet 650 mg  650 mg Oral Q4H PRN Mills, Shnese E, NP   650 mg at 08/27/23 1602   alum & mag hydroxide-simeth (MAALOX/MYLANTA) 200-200-20 MG/5ML suspension 30 mL  30 mL Oral Q6H PRN Mills, Shnese E, NP       divalproex  (DEPAKOTE  ER) 24 hr tablet 1,000 mg  1,000 mg Oral q12n4p Izediuno, Vincent A, MD   1,000 mg at 08/28/23 1610   gabapentin  (NEURONTIN ) capsule 200 mg  200 mg Oral TID Mills, Shnese E, NP   200 mg at 08/28/23 1150   haloperidol  (HALDOL ) tablet 30 mg  30 mg Oral QHS Michiah Mudry S, MD   30 mg at 08/27/23 2132   Or   haloperidol  lactate (HALDOL ) injection 15 mg  15 mg Intramuscular QHS Erich Kochan S, MD       haloperidol  (HALDOL ) tablet 5 mg  5 mg Oral TID PRN Mills, Shnese E, NP   5 mg at 08/20/23 0745   haloperidol  lactate (HALDOL ) injection 10 mg  10 mg Intramuscular TID PRN Mills, Shnese E, NP       haloperidol  lactate (HALDOL ) injection 5 mg  5 mg Intramuscular TID PRN Mills, Shnese E, NP   5 mg at 08/21/23 1215   And   LORazepam  (ATIVAN ) injection 2 mg  2 mg Intramuscular TID PRN Mills, Shnese E, NP   2 mg at 08/21/23 1216   LORazepam  (ATIVAN ) tablet 1 mg  1 mg Oral BID PRN Cabella Kimm S, MD       magnesium  hydroxide (MILK OF MAGNESIA) suspension 30 mL  30 mL Oral Daily PRN Mills, Shnese E, NP       magnesium  oxide (MAG-OX) tablet 400 mg  400 mg Oral QHS [name], [name], NP   400 mg at 08/27/23 2132   multivitamin with minerals tablet 1 tablet  1 tablet Oral Daily Carrion-Carrero, [name], MD   1 tablet at 08/28/23 0834   nicotine  (NICODERM CQ  - dosed in mg/24 hours) patch 14 mg  14 mg Transdermal Daily Attiah, Nadir, MD   14 mg at 08/28/23 9604   nicotine  polacrilex (NICORETTE ) gum 2 mg  2 mg Oral PRN  Mills, Shnese E, NP   2 mg at 08/28/23 1244   ondansetron  (ZOFRAN ) tablet 4 mg  4 mg Oral Q8H PRN Mills, Shnese E, NP       traZODone  (DESYREL ) tablet 100 mg  100 mg Oral QHS Carrion-Carrero, [name], MD   100 mg at 08/27/23 2132    Lab Results:  No results found for this or any  previous visit (from the past 48 hours).   Blood Alcohol level:  Lab Results  Component Value Date   [facility] <15 08/15/2023   ETH <10 05/17/2023    Metabolic Disorder Labs: Lab Results  Component Value Date   HGBA1C 4.9 05/17/2023   MPG 93.93 05/17/2023   MPG 93.93 09/29/2021   No results found for: "PROLACTIN" Lab Results  Component Value Date   CHOL 152 05/17/2023   TRIG 70 05/17/2023   HDL 78 05/17/2023   CHOLHDL 1.9 05/17/2023   VLDL 14 05/17/2023   LDLCALC 60 05/17/2023   LDLCALC 55 02/19/2020    Physical Findings: AIMS:  , ,  ,  ,    CIWA:    COWS:     Musculoskeletal: Strength & Muscle Tone: within normal limits Gait & Station: normal Patient leans: N/A  Psychiatric Specialty Exam:  Presentation  General Appearance:  Appropriate for Environment; Casual  Eye Contact: Fair  Speech: Clear and Coherent; Normal Rate  Speech Volume: Increased  Handedness: Right   Mood and Affect  Mood: Anxious; Irritable  Affect: Labile   Thought Process  Thought Processes: Coherent; Goal Directed  Descriptions of Associations:Intact  Orientation:Full (Time, Place and Person)  Thought Content:Perseveration  History of Schizophrenia/Schizoaffective disorder:No  Duration of Psychotic Symptoms:Less than six months  Hallucinations:Hallucinations: None  Ideas of Reference:None  Suicidal Thoughts:Suicidal Thoughts: No  Homicidal Thoughts:Homicidal Thoughts: No   Sensorium  Memory: Immediate Fair  Judgment: Intact  Insight: Present   Executive Functions  Concentration: Fair  Attention Span: Fair  Recall: Fair  Fund of  Knowledge: Good  Language: Good   Psychomotor Activity  Psychomotor Activity: Psychomotor Activity: Normal   Assets  Assets: Communication Skills; Desire for Improvement; Physical Health; Resilience   Sleep  Sleep: Sleep: Good Number of Hours of Sleep: 7.25    Physical Exam: Physical Exam Vitals and nursing note reviewed.  Constitutional:      General: Ronald [name] is not in acute distress.    Appearance: Normal appearance. Ronald [name] is normal weight. Ronald [name] is not ill-appearing or toxic-appearing.  HENT:     Head: Normocephalic and atraumatic.  Pulmonary:     Effort: Pulmonary effort is normal.  Musculoskeletal:        General: Normal range of motion.  Neurological:     General: No focal deficit present.     Mental Status: Ronald [name] is alert.    Review of Systems  Respiratory:  Negative for cough and shortness of breath.   Cardiovascular:  Negative for chest pain.  Gastrointestinal:  Negative for abdominal pain, constipation, diarrhea, nausea and vomiting.  Neurological:  Negative for dizziness, weakness and headaches.  Psychiatric/Behavioral:  Negative for depression, hallucinations and suicidal ideas. The patient is nervous/anxious.    Blood pressure (!) 121/92, pulse 70, temperature 97.7 F (36.5 C), temperature source Oral, resp. rate 18, height 5\' 11"  (1.803 m), weight 73.5 kg, SpO2 99%. Body mass index is 22.59 kg/m.   Treatment Plan Summary: Daily contact with patient to assess and evaluate symptoms and progress in treatment and Medication management  Ronald [name] is a 43 yr old male who presented on 5/2 to [facility] under IVC due to bizarre, aggressive behavior and hallucinations, Ronald [name] was admitted to [facility] on 5/4.  PPHx is significant for Schizoaffective Disorder, Bipolar Type, GAD, PTSD, and Polysubstance Abuse (Benzo, THC), and 5 Prior Psychiatric Hospitalizations (last- [facility] 05/2023).   Ronald [name] is more irritable today and continues to perseverate on his mother and discharge.  Ronald [name]  continues to deny paranoia but is continuing to discplay paranoia about his hospitalization.  When discussing his diagnosis of Schizoaffective disorder Ronald [name] pushed back against this and insists Ronald [name] only has bipolar disorder.  We increased his Haldol  yesterday so will not make any changes to his medication at this time.  If Ronald [name] does not show improvement soon may need to consider alternative medication.  We will continue to monitor.   Schizoaffective Disorder, Bipolar Type: -Continue Depakote  1000 mg BID for mood stability -Continue Haldol  30 mg QHS PO or 15 mg IM for psychosis and mood stability -Non Emergent Medications Against Objection Order placed by Dr. Zouve on 5/8 -Continue Gabapentin  200 mg TID for mood stability -Continue Agitation Protocol: Haldol /Ativan    Nicotine  Dependence: -Continue Nicotine  Patch 14 mg daily -Continue Nicotine  Gum 2 mg PRN   -Continue Mag-Ox 400 mg QHS -Continue Multivitamin daily -Continue Trazodone  100 mg QHS -Continue PRN's: Tylenol , Maalox, Milk of Magnesia   --  The risks/benefits/side-effects/alternatives to medications were discussed in detail with the patient and time was given for questions. The patient consents to medication trials.                -- Metabolic profile and EKG monitoring obtained while on an atypical antipsychotic (BMI: Lipid Panel: HbgA1c: QTc:)              -- Encouraged patient to participate in unit milieu and in scheduled group therapies     Safety and Monitoring:             -- Involuntary admission to inpatient psychiatric unit for safety, stabilization and treatment             -- Daily contact with patient to assess and evaluate symptoms and progress in treatment             -- Patient's case to be discussed in multi-disciplinary team meeting             -- Observation Level : q15 minute checks             -- Vital signs:  q12 hours             -- Precautions: suicide, elopement, and assault  Discharge Planning:               -- Social work and case management to assist with discharge planning and identification of hospital follow-up needs prior to discharge             -- Estimated LOS: 2-4 more days             -- Discharge Concerns: Need to establish a safety plan; Medication compliance and effectiveness             -- Discharge Goals: Return home with outpatient referrals for mental health follow-up including medication management/psychotherapy]   Basilia Bosworth, MD 08/28/2023, 1:43 PM

## 2023-08-28 NOTE — Progress Notes (Signed)
 Pt stated "I'm having a synthetic panic attack" , pt given PRN ativan  per [facility]

## 2023-08-29 ENCOUNTER — Encounter (HOSPITAL_COMMUNITY): Payer: Self-pay | Admitting: Adult Health

## 2023-08-29 DIAGNOSIS — F250 Schizoaffective disorder, bipolar type: Secondary | ICD-10-CM

## 2023-08-29 DIAGNOSIS — F312 Bipolar disorder, current episode manic severe with psychotic features: Secondary | ICD-10-CM

## 2023-08-29 HISTORY — DX: Bipolar disorder, current episode manic severe with psychotic features: F31.2

## 2023-08-29 HISTORY — DX: Schizoaffective disorder, bipolar type: F25.0

## 2023-08-29 NOTE — Plan of Care (Signed)
   Problem: Education: Goal: Emotional status will improve Outcome: Progressing Goal: Mental status will improve Outcome: Progressing   Problem: Activity: Goal: Interest or engagement in activities will improve Outcome: Progressing Goal: Sleeping patterns will improve Outcome: Progressing

## 2023-08-29 NOTE — Progress Notes (Signed)
   08/29/23 2100  Psych Admission Type (Psych Patients Only)  Admission Status Involuntary  Psychosocial Assessment  Patient Complaints Anxiety  Eye Contact Fair  Facial Expression Anxious  Affect Anxious  Speech Logical/coherent  Interaction Assertive  Motor Activity Slow  Appearance/Hygiene Body odor  Behavior Characteristics Cooperative  Mood Anxious  Aggressive Behavior  Effect No apparent injury  Thought Process  Coherency WDL  Content WDL  Delusions WDL  Perception WDL  Hallucination None reported or observed  Judgment Impaired  Confusion None  Danger to Self  Current suicidal ideation? Denies  Danger to Others  Danger to Others None reported or observed

## 2023-08-29 NOTE — Plan of Care (Signed)
  Problem: Activity: Goal: Interest or engagement in activities will improve Outcome: Progressing   Problem: Coping: Goal: Ability to verbalize frustrations and anger appropriately will improve Outcome: Progressing   Problem: Physical Regulation: Goal: Ability to maintain clinical measurements within normal limits will improve Outcome: Progressing   

## 2023-08-29 NOTE — Group Note (Signed)
 Date:  08/29/2023 Time:  2:21 AM  Group Topic/Focus:  Wrap-Up Group:   The focus of this group is to help patients review their daily goal of treatment and discuss progress on daily workbooks.    Participation Level:  Active  Participation Quality:  Appropriate and Sharing  Affect:  Appropriate  Cognitive:  Appropriate  Insight: Appropriate and Limited  Engagement in Group:  Engaged  Modes of Intervention:  Activity and Socialization  Additional Comments:  Patient attended and participated wrap up group. Patient shared about their day and participated in activity.   Dillard Frame 08/29/2023, 2:21 AM

## 2023-08-29 NOTE — [facility] Group Notes (Signed)
 The focus of this group is to help patients establish daily goals to achieve during treatment and discuss how the patient can incorporate goal setting into their daily lives to aide in recovery.        Scale 1-10  8    Goal: Staying positive

## 2023-08-29 NOTE — Group Note (Signed)
 Recreation Therapy Group Note   Group Topic:Problem Solving  Group Date: 08/29/2023 Start Time: 0935 End Time: 0958 Facilitators: Delmas Faucett-McCall, LRT,CTRS Location: [address] Dayroom   Group Topic: Communication, Team Building, Problem Solving  Goal Area(s) Addresses:  Patient will effectively work with peer towards shared goal.  Patient will identify skills used to make activity successful.  Patient will identify how skills used during activity can be used to reach post d/c goals.   Intervention: STEM Activity  Activity: Stage manager. In teams of 3-5, patients were given 12 plastic drinking straws and an equal length of masking tape. Using the materials provided, patients were asked to build a landing pad to catch a golf ball dropped from approximately 5 feet in the air. All materials were required to be used by the team in their design. LRT facilitated post-activity discussion.  Education: Pharmacist, community, Scientist, physiological, Discharge Planning   Education Outcome: Acknowledges education/In group clarification offered/Needs additional education.    Affect/Mood: N/A   Participation Level: Did not attend    Clinical Observations/Individualized Feedback:     Plan: Continue to engage patient in RT group sessions 2-3x/week.   Macee Venables-McCall, LRT,CTRS 08/29/2023 12:41 PM

## 2023-08-29 NOTE — Progress Notes (Signed)
 [facility] MD Progress Note  08/29/2023 11:31 AM Ronald [name]  MRN:  [id number] Subjective:   Ronald [name] is a 43 yr old male who presented on 5/2 to [facility] under IVC due to bizarre, aggressive behavior and hallucinations, he was admitted to [facility] on 5/4.  PPHx is significant for Schizoaffective Disorder, Bipolar Type, GAD, PTSD, and Polysubstance Abuse (Benzo, THC), and 5 Prior Psychiatric Hospitalizations (last- [facility] 05/2023).  Case was discussed in the multidisciplinary team. MAR was reviewed and patient was compliant with medications.  He received PRN nicotine  gum and Ativan  X2.   Psychiatric Team made the following recommendations yesterday: - Continue current medications (Haldol  increased to 30 mg recently)  The patient was markedly irritable on exam today.  He told me that he plan to bring a lawsuit against the hospital, the doctors, and the nurses.  He told me that my children were going to hospitalize me and when that happened he would have no sympathy.  He tells me that he needs to leave the hospital immediately because his mother has cancer.  When I informed him that his mother believes that he needs to be in the hospital because he is not behaving as he does at baseline, the patient became more irritable, and continue to show absolutely no insight into his manic state.  He remains grandiose and delusional.  He does seem less elevated since the Haldol  was increased.  He remains focused on discharge.  The patient denies suicidal ideation, homicidal ideation, auditory hallucinations, or visual hallucinations.      Principal Problem: Schizoaffective disorder (HCC) Diagnosis: Principal Problem:   Schizoaffective disorder (HCC) Active Problems:   Substance induced mood disorder (HCC)  Total Time spent with patient:  I personally spent 35 minutes on the unit in direct patient care. The direct patient care time included face-to-face time with the patient, reviewing the patient's chart,  communicating with other professionals, and coordinating care. Greater than 50% of this time was spent in counseling or coordinating care with the patient regarding goals of hospitalization, psycho-education, and discharge planning needs.   Past Psychiatric History:  Schizoaffective Disorder, Bipolar Type, GAD, PTSD, and Polysubstance Abuse (Benzo, THC), and 5 Prior Psychiatric Hospitalizations (last- [facility] 05/2023).  Past Medical History:  Past Medical History:  Diagnosis Date   Anxiety    Arthritis    Disorder of pineal gland    Post traumatic stress disorder    Schizoaffective disorder, bipolar type (HCC) 08/29/2023   Severe manic bipolar 1 disorder with psychotic behavior (HCC) 08/29/2023    Past Surgical History:  Procedure Laterality Date   ANKLE SURGERY     Family History: History reviewed. No pertinent family history. Family Psychiatric  History:  No Known  Social History:  Social History   Substance and Sexual Activity  Alcohol Use No     Social History   Substance and Sexual Activity  Drug Use Yes   Types: Marijuana   Comment: one year ago    Social History   Socioeconomic History   Marital status: Single    Spouse name: Not on file   Number of children: Not on file   Years of education: Not on file   Highest education level: Not on file  Occupational History   Not on file  Tobacco Use   Smoking status: Never   Smokeless tobacco: Current    Types: Chew  Vaping Use   Vaping status: Never Used  Substance and Sexual Activity   Alcohol use:  No   Drug use: Yes    Types: Marijuana    Comment: one year ago   Sexual activity: Not Currently  Other Topics Concern   Not on file  Social History Narrative   Not on file   Social Drivers of Health   Financial Resource Strain: Not on file  Food Insecurity: Patient Declined (08/16/2023)   Hunger Vital Sign    Worried About Running Out of Food in the Last Year: Patient declined    Ran Out of Food in the Last  Year: Patient declined  Transportation Needs: Patient Declined (08/16/2023)   PRAPARE - Administrator, Civil Service (Medical): Patient declined    Lack of Transportation (Non-Medical): Patient declined  Physical Activity: Not on file  Stress: Not on file  Social Connections: Not on file   Additional Social History:                         Sleep: Good   Appetite:  Good   Current Medications: Current Facility-Administered Medications  Medication Dose Route Frequency Provider Last Rate Last Admin   acetaminophen  (TYLENOL ) tablet 650 mg  650 mg Oral Q4H PRN Mills, Shnese E, NP   650 mg at 08/28/23 1607   alum & mag hydroxide-simeth (MAALOX/MYLANTA) 200-200-20 MG/5ML suspension 30 mL  30 mL Oral Q6H PRN Mills, Shnese E, NP       divalproex  (DEPAKOTE  ER) 24 hr tablet 1,000 mg  1,000 mg Oral q12n4p Izediuno, Vincent A, MD   1,000 mg at 08/29/23 4782   gabapentin  (NEURONTIN ) capsule 200 mg  200 mg Oral TID Mills, Shnese E, NP   200 mg at 08/29/23 9562   haloperidol  (HALDOL ) tablet 30 mg  30 mg Oral QHS Pashayan, Alexander S, MD   30 mg at 08/28/23 2107   Or   haloperidol  lactate (HALDOL ) injection 15 mg  15 mg Intramuscular QHS Pashayan, Alexander S, MD       haloperidol  (HALDOL ) tablet 5 mg  5 mg Oral TID PRN Mills, Shnese E, NP   5 mg at 08/20/23 0745   haloperidol  lactate (HALDOL ) injection 10 mg  10 mg Intramuscular TID PRN Doneen Fuelling, NP       haloperidol  lactate (HALDOL ) injection 5 mg  5 mg Intramuscular TID PRN Orvil Bland E, NP   5 mg at 08/21/23 1215   And   LORazepam  (ATIVAN ) injection 2 mg  2 mg Intramuscular TID PRN Mills, Shnese E, NP   2 mg at 08/21/23 1216   LORazepam  (ATIVAN ) tablet 1 mg  1 mg Oral BID PRN Pashayan, Alexander S, MD   1 mg at 08/29/23 1115   magnesium  hydroxide (MILK OF MAGNESIA) suspension 30 mL  30 mL Oral Daily PRN Mills, Shnese E, NP       magnesium  oxide (MAG-OX) tablet 400 mg  400 mg Oral QHS [name], [name], NP   400 mg at  08/28/23 2107   multivitamin with minerals tablet 1 tablet  1 tablet Oral Daily Carrion-Carrero, [name], MD   1 tablet at 08/29/23 0911   nicotine  (NICODERM CQ  - dosed in mg/24 hours) patch 14 mg  14 mg Transdermal Daily Attiah, Nadir, MD   14 mg at 08/29/23 0912   nicotine  polacrilex (NICORETTE ) gum 2 mg  2 mg Oral PRN Mills, Shnese E, NP   2 mg at 08/29/23 0914   ondansetron  (ZOFRAN ) tablet 4 mg  4 mg Oral Q8H PRN  Doneen Fuelling, NP       traZODone  (DESYREL ) tablet 100 mg  100 mg Oral QHS Carrion-Carrero, [name], MD   100 mg at 08/28/23 2107    Lab Results:  Results for orders placed or performed during the hospital encounter of 08/16/23 (from the past 48 hours)  Comprehensive metabolic panel with GFR     Status: Abnormal   Collection Time: 08/28/23  6:35 PM  Result Value Ref Range   Sodium 136 135 - 145 mmol/L   Potassium 3.9 3.5 - 5.1 mmol/L   Chloride 99 98 - 111 mmol/L   CO2 27 22 - 32 mmol/L   Glucose, Bld 113 (H) 70 - 99 mg/dL    Comment: Glucose reference range applies only to samples taken after fasting for at least 8 hours.   BUN 13 6 - 20 mg/dL   Creatinine, Ser 0.86 0.61 - 1.24 mg/dL   Calcium 8.7 (L) 8.9 - 10.3 mg/dL   Total Protein 6.6 6.5 - 8.1 g/dL   Albumin 3.9 3.5 - 5.0 g/dL   AST 15 15 - 41 U/L   ALT 14 0 - 44 U/L   Alkaline Phosphatase 50 38 - 126 U/L   Total Bilirubin 0.3 0.0 - 1.2 mg/dL   GFR, Estimated >57 >84 mL/min    Comment: (NOTE) Calculated using the CKD-EPI Creatinine Equation (2021)    Anion gap 10 5 - 15    Comment: Performed at [facility], [address], [place], [name] [address]  CBC     Status: None   Collection Time: 08/28/23  6:35 PM  Result Value Ref Range   WBC 6.8 4.0 - 10.5 K/uL   RBC 4.78 4.22 - 5.81 MIL/uL   Hemoglobin 14.5 13.0 - 17.0 g/dL   HCT 52.8 41.3 - 24.4 %   MCV 91.2 80.0 - 100.0 fL   MCH 30.3 26.0 - 34.0 pg   MCHC 33.3 30.0 - 36.0 g/dL   RDW 01.0 27.2 - 53.6 %   Platelets 225 150 - 400 K/uL    nRBC 0.0 0.0 - 0.2 %    Comment: Performed at [facility], [address] [place], [name] [address]  Valproic acid  level     Status: None   Collection Time: 08/28/23  6:35 PM  Result Value Ref Range   Valproic Acid  Lvl 71 50 - 100 ug/mL    Comment: Performed at [facility], [address], [place], [name] [address]     Blood Alcohol level:  Lab Results  Component Value Date   [facility] <15 08/15/2023   ETH <10 05/17/2023    Metabolic Disorder Labs: Lab Results  Component Value Date   HGBA1C 4.9 05/17/2023   MPG 93.93 05/17/2023   MPG 93.93 09/29/2021   No results found for: "PROLACTIN" Lab Results  Component Value Date   CHOL 152 05/17/2023   TRIG 70 05/17/2023   HDL 78 05/17/2023   CHOLHDL 1.9 05/17/2023   VLDL 14 05/17/2023   LDLCALC 60 05/17/2023   LDLCALC 55 02/19/2020    Physical Findings: AIMS:  , ,  ,  ,    CIWA:    COWS:     Musculoskeletal: Strength & Muscle Tone: within normal limits Gait & Station: normal Patient leans: N/A  Psychiatric Specialty Exam:  Presentation  General Appearance:  Disheveled  Eye Contact: Fair  Speech: Pressured  Speech Volume: Increased  Handedness: Right   Mood and Affect  Mood: Anxious; Irritable; Dysphoric  Affect: Congruent; Restricted   Thought Process  Thought Processes: Disorganized  Descriptions of Associations:Loose  Orientation:Full (Time, Place and Person)  Thought Content:Illogical; Paranoid Ideation; Delusions  History of Schizophrenia/Schizoaffective disorder:No  Duration of Psychotic Symptoms:N/A  Hallucinations:Hallucinations: None  Ideas of Reference:None  Suicidal Thoughts:Suicidal Thoughts: No  Homicidal Thoughts:Homicidal Thoughts: No   Sensorium  Memory: Immediate Good; Recent Good  Judgment: Poor  Insight: Poor   Executive Functions  Concentration: Good  Attention Span: Good  Recall: Good  Fund of  Knowledge: Good  Language: Good   Psychomotor Activity  Psychomotor Activity: Psychomotor Activity: Normal   Assets  Assets: Communication Skills; Social Support; Housing   Sleep  Sleep: Sleep: Fair Number of Hours of Sleep: 7.25    Physical Exam: Physical Exam Vitals and nursing note reviewed.  Constitutional:      General: He is not in acute distress.    Appearance: Normal appearance. He is normal weight. He is not ill-appearing or toxic-appearing.  HENT:     Head: Normocephalic and atraumatic.  Pulmonary:     Effort: Pulmonary effort is normal.  Musculoskeletal:        General: Normal range of motion.  Neurological:     General: No focal deficit present.     Mental Status: He is alert.    Review of Systems  Respiratory:  Negative for cough and shortness of breath.   Cardiovascular:  Negative for chest pain.  Gastrointestinal:  Negative for abdominal pain, constipation, diarrhea, nausea and vomiting.  Neurological:  Negative for dizziness, weakness and headaches.  Psychiatric/Behavioral:  Negative for depression, hallucinations and suicidal ideas. The patient is nervous/anxious.    Blood pressure 120/78, pulse 74, temperature 97.6 F (36.4 C), temperature source Oral, resp. rate 18, height 5\' 11"  (1.803 m), weight 73.5 kg, SpO2 98%. Body mass index is 22.59 kg/m.   Treatment Plan Summary: Daily contact with patient to assess and evaluate symptoms and progress in treatment and Medication management  Ronald [name] is a 43 yr old male who presented on 5/2 to [facility] under IVC due to bizarre, aggressive behavior and hallucinations, he was admitted to [facility] on 5/4.  PPHx is significant for Schizoaffective Disorder, Bipolar Type, GAD, PTSD, and Polysubstance Abuse (Benzo, THC), and 5 Prior Psychiatric Hospitalizations (last- [facility] 05/2023).   Ronald [name] is more irritable today and continues to perseverate on his mother and discharge.  He continues to deny paranoia but is  continuing to discplay paranoia about his hospitalization.  When discussing his diagnosis of Schizoaffective disorder he pushed back against this and insists he only has bipolar disorder.  We increased his Haldol  yesterday so will not make any changes to his medication at this time.  If he does not show improvement soon may need to consider alternative medication.  We will continue to monitor.   Schizoaffective Disorder, Bipolar Type: -Continue Depakote  1000 mg BID for mood stability -Continue Haldol  30 mg QHS PO or 15 mg IM for psychosis and mood stability -Non Emergent Medications Against Objection Order placed by Dr. Zouve on 5/8 -Continue Gabapentin  200 mg TID for mood stability -Continue Agitation Protocol: Haldol /Ativan    Nicotine  Dependence: -Continue Nicotine  Patch 14 mg daily -Continue Nicotine  Gum 2 mg PRN   -Continue Mag-Ox 400 mg QHS -Continue Multivitamin daily -Continue Trazodone  100 mg QHS -Continue PRN's: Tylenol , Maalox, Milk of Magnesia   --  The risks/benefits/side-effects/alternatives to medications were discussed in detail with the patient and time was given for questions.  The patient consents to medication trials.                -- Metabolic profile and EKG monitoring obtained while on an atypical antipsychotic (BMI: Lipid Panel: HbgA1c: QTc:)              -- Encouraged patient to participate in unit milieu and in scheduled group therapies     Safety and Monitoring:             -- Involuntary admission to inpatient psychiatric unit for safety, stabilization and treatment             -- Daily contact with patient to assess and evaluate symptoms and progress in treatment             -- Patient's case to be discussed in multi-disciplinary team meeting             -- Observation Level : q15 minute checks             -- Vital signs:  q12 hours             -- Precautions: suicide, elopement, and assault  Discharge Planning:              -- Social work and case  management to assist with discharge planning and identification of hospital follow-up needs prior to discharge             -- Estimated LOS: 2-4 more days             -- Discharge Concerns: Need to establish a safety plan; Medication compliance and effectiveness             -- Discharge Goals: Return home with outpatient referrals for mental health follow-up including medication management/psychotherapy]   Timmothy Foots, MD 08/29/2023, 11:31 AM

## 2023-08-29 NOTE — Progress Notes (Signed)
   08/29/23 0808  Psych Admission Type (Psych Patients Only)  Admission Status Involuntary  Psychosocial Assessment  Patient Complaints Anxiety  Eye Contact Fair  Facial Expression Anxious  Affect Anxious  Speech Logical/coherent  Interaction Assertive  Motor Activity Other (Comment) (WDL)  Appearance/Hygiene Body odor  Behavior Characteristics Cooperative;Appropriate to situation  Mood Anxious;Despair;Pleasant  Aggressive Behavior  Effect No apparent injury  Thought Process  Coherency WDL  Content WDL  Delusions WDL;None reported or observed  Perception WDL  Hallucination None reported or observed  Judgment Impaired  Confusion None  Danger to Self  Current suicidal ideation? Denies  Self-Injurious Behavior No self-injurious ideation or behavior indicators observed or expressed   Agreement Not to Harm Self Yes  Description of Agreement Verbal contracts for safety  Danger to Others  Danger to Others None reported or observed

## 2023-08-30 NOTE — Progress Notes (Signed)
   08/30/23 1000  Psych Admission Type (Psych Patients Only)  Admission Status Involuntary  Psychosocial Assessment  Patient Complaints None  Eye Contact Fair  Facial Expression Animated  Affect Appropriate to circumstance  Speech Logical/coherent  Interaction Assertive  Motor Activity Slow  Appearance/Hygiene Body odor  Behavior Characteristics Cooperative  Mood Pleasant  Thought Process  Coherency WDL  Content WDL  Delusions None reported or observed  Perception WDL  Hallucination None reported or observed  Judgment Impaired  Confusion None  Danger to Self  Current suicidal ideation? Denies  Agreement Not to Harm Self Yes  Description of Agreement verbal  Danger to Others  Danger to Others None reported or observed

## 2023-08-30 NOTE — Group Note (Signed)
 Date:  08/30/2023 Time:  10:45 AM  Group Topic/Focus:  Goals Group:   The focus of this group is to help patients establish daily goals to achieve during treatment and discuss how the patient can incorporate goal setting into their daily lives to aide in recovery. Orientation:   The focus of this group is to educate the patient on the purpose and policies of crisis stabilization and provide a format to answer questions about their admission.  The group details unit policies and expectations of patients while admitted.    Participation Level:  Active  Ronald [name] 08/30/2023, 10:45 AM

## 2023-08-30 NOTE — Plan of Care (Signed)
   Problem: Education: Goal: Knowledge of [facility] Education information/materials will improve Outcome: Progressing Goal: Emotional status will improve Outcome: Progressing Goal: Mental status will improve Outcome: Progressing

## 2023-08-30 NOTE — Plan of Care (Signed)
  Problem: Education: Goal: Emotional status will improve Outcome: Progressing Goal: Verbalization of understanding the information provided will improve Outcome: Progressing   Problem: Activity: Goal: Interest or engagement in activities will improve Outcome: Progressing   Problem: Coping: Goal: Ability to verbalize frustrations and anger appropriately will improve Outcome: Progressing Goal: Ability to demonstrate self-control will improve Outcome: Progressing

## 2023-08-30 NOTE — Group Note (Signed)
 Date:  08/30/2023 Time:  10:57 AM  Group Topic/Focus:  Emotional Education:   The focus of this group is to discuss what feelings/emotions are, and how they are experienced.    Participation Level:  Active  Dywane Peruski J Shaneece Stockburger 08/30/2023, 10:57 AM

## 2023-08-30 NOTE — Group Note (Signed)
 LCSW Group Therapy Note   Type of Therapy and Topic:  Group Therapy - Safety  Participation Level:  Active   Description of Group This process group involved patients discussing the situations or people in their lives that frequently make them safe or unsafe.  Anxiety was a common factor among all group participants and many of them described home situations that keep them on edge and not able to feel completely safe.  Three questions were addressed during the group:  (1) What makes you feel safe (or unsafe)?  (2) Do you feel safe with yourself and why?  (3) If you don't feel safe, what can you do?  A lengthy discussion ensued in which group members empathized with each other, gave suggestions to one another, and expressed their feelings freely.  Therapeutic Goals Patient will describe what makes them feel safe or unsafe in their everyday lives. Patient will think about and discuss whether they feel safe with themselves and what reasons might contribute to feeling safe or unsafe. Patients will participate in planning for what can be done to help themselves feel safer.   Summary of Patient Progress:  The patient came to group late. The patient share that he is grieving over the loss of his family members. The patient share about his mother. The patient share that "safety is what a reasonably prudent person do in the same or similar circumstances to prevent any degree of harm.    Therapeutic Modalities Cognitive Behavioral Therapy   Meta Kroenke O Estella Malatesta, LCSWA 08/30/2023  4:26 PM

## 2023-08-30 NOTE — [facility] Group Notes (Signed)
 [facility] Group Notes:  (Nursing/MHT/Case Management/Adjunct)  Date:  08/30/2023  Time:  2000  Type of Therapy:  Wrap up group  Participation Level:  Active  Participation Quality:  Appropriate, Attentive, Sharing, and Supportive  Affect:  Appropriate  Cognitive:  Alert  Insight:  Improving  Engagement in Group:  Engaged  Modes of Intervention:  Clarification, Education, and Support  Summary of Progress/Problems: Positive thinking and positive change were discussed.   Ronald [name] S 08/30/2023, 10:01 PM

## 2023-08-30 NOTE — Progress Notes (Signed)
 [facility] MD Progress Note  08/30/2023 11:18 AM Ronald [name]  MRN:  [id number] Subjective:   Ronald [name] is a 43 yr old male who presented on 5/2 to [facility] under IVC due to bizarre, aggressive behavior and hallucinations, he was admitted to [facility] on 5/4.  PPHx is significant for Schizoaffective Disorder, Bipolar Type, GAD, PTSD, and Polysubstance Abuse (Benzo, THC), and 5 Prior Psychiatric Hospitalizations (last- [facility] 05/2023).  Case was discussed in the multidisciplinary team. MAR was reviewed and patient was compliant with medications.  He received received PRN Tylenol  and Ativan  X2 yesterday,   Psychiatric Team made the following recommendations yesterday: -Continue Depakote  1000 mg BID for mood stability -Increase Haldol  30 mg QHS PO or 15 mg IM for psychosis and mood stability    On interview today patient reports he slept good last night.  He reports his appetite is doing good.  He reports no SI, HI, or AVH.  He reports no Paranoia or Ideas of Reference.  He reports no issues with his medications.  Discussed his anxiety and he reports no panic attacks. Discussed the importance of focusing on his health and improvement before he is able to care for his mother and he reported agreement.  He reports using meditation to help with his anxiety.  He reports no other concerns at present.    Principal Problem: Schizoaffective disorder (HCC) Diagnosis: Principal Problem:   Schizoaffective disorder (HCC) Active Problems:   Substance induced mood disorder (HCC)  Total Time spent with patient:  I personally spent 35 minutes on the unit in direct patient care. The direct patient care time included face-to-face time with the patient, reviewing the patient's chart, communicating with other professionals, and coordinating care. Greater than 50% of this time was spent in counseling or coordinating care with the patient regarding goals of hospitalization, psycho-education, and discharge planning  needs.   Past Psychiatric History:  Schizoaffective Disorder, Bipolar Type, GAD, PTSD, and Polysubstance Abuse (Benzo, THC), and 5 Prior Psychiatric Hospitalizations (last- [facility] 05/2023).  Past Medical History:  Past Medical History:  Diagnosis Date   Anxiety    Arthritis    Disorder of pineal gland    Post traumatic stress disorder    Schizoaffective disorder, bipolar type (HCC) 08/29/2023   Severe manic bipolar 1 disorder with psychotic behavior (HCC) 08/29/2023    Past Surgical History:  Procedure Laterality Date   ANKLE SURGERY     Family History: History reviewed. No pertinent family history. Family Psychiatric  History:  No Known  Social History:  Social History   Substance and Sexual Activity  Alcohol Use No     Social History   Substance and Sexual Activity  Drug Use Yes   Types: Marijuana   Comment: one year ago    Social History   Socioeconomic History   Marital status: Single    Spouse name: Not on file   Number of children: Not on file   Years of education: Not on file   Highest education level: Not on file  Occupational History   Not on file  Tobacco Use   Smoking status: Never   Smokeless tobacco: Current    Types: Chew  Vaping Use   Vaping status: Never Used  Substance and Sexual Activity   Alcohol use: No   Drug use: Yes    Types: Marijuana    Comment: one year ago   Sexual activity: Not Currently  Other Topics Concern   Not on file  Social History  Narrative   Not on file   Social Drivers of Health   Financial Resource Strain: Not on file  Food Insecurity: Patient Declined (08/16/2023)   Hunger Vital Sign    Worried About Running Out of Food in the Last Year: Patient declined    Ran Out of Food in the Last Year: Patient declined  Transportation Needs: Patient Declined (08/16/2023)   PRAPARE - Administrator, Civil Service (Medical): Patient declined    Lack of Transportation (Non-Medical): Patient declined  Physical  Activity: Not on file  Stress: Not on file  Social Connections: Not on file   Additional Social History:                         Sleep: Good   Appetite:  Good   Current Medications: Current Facility-Administered Medications  Medication Dose Route Frequency Provider Last Rate Last Admin   acetaminophen  (TYLENOL ) tablet 650 mg  650 mg Oral Q4H PRN Mills, Shnese E, NP   650 mg at 08/29/23 1706   alum & mag hydroxide-simeth (MAALOX/MYLANTA) 200-200-20 MG/5ML suspension 30 mL  30 mL Oral Q6H PRN Mills, Shnese E, NP       divalproex  (DEPAKOTE  ER) 24 hr tablet 1,000 mg  1,000 mg Oral q12n4p Izediuno, Vincent A, MD   1,000 mg at 08/30/23 1610   gabapentin  (NEURONTIN ) capsule 200 mg  200 mg Oral TID Mills, Shnese E, NP   200 mg at 08/30/23 0803   haloperidol  (HALDOL ) tablet 30 mg  30 mg Oral QHS Amando Ishikawa S, MD   30 mg at 08/29/23 2105   Or   haloperidol  lactate (HALDOL ) injection 15 mg  15 mg Intramuscular QHS Yanni Quiroa S, MD       haloperidol  (HALDOL ) tablet 5 mg  5 mg Oral TID PRN Mills, Shnese E, NP   5 mg at 08/20/23 0745   haloperidol  lactate (HALDOL ) injection 10 mg  10 mg Intramuscular TID PRN Mills, Shnese E, NP       haloperidol  lactate (HALDOL ) injection 5 mg  5 mg Intramuscular TID PRN Mills, Shnese E, NP   5 mg at 08/21/23 1215   And   LORazepam  (ATIVAN ) injection 2 mg  2 mg Intramuscular TID PRN Mills, Shnese E, NP   2 mg at 08/21/23 1216   LORazepam  (ATIVAN ) tablet 1 mg  1 mg Oral BID PRN Omero Kowal S, MD   1 mg at 08/30/23 9604   magnesium  hydroxide (MILK OF MAGNESIA) suspension 30 mL  30 mL Oral Daily PRN Mills, Shnese E, NP       magnesium  oxide (MAG-OX) tablet 400 mg  400 mg Oral QHS [name], [name], NP   400 mg at 08/29/23 2108   multivitamin with minerals tablet 1 tablet  1 tablet Oral Daily Carrion-Carrero, [name], MD   1 tablet at 08/30/23 0803   nicotine  (NICODERM CQ  - dosed in mg/24 hours) patch 14 mg  14 mg Transdermal Daily  Attiah, Nadir, MD   14 mg at 08/30/23 0803   nicotine  polacrilex (NICORETTE ) gum 2 mg  2 mg Oral PRN Mills, Shnese E, NP   2 mg at 08/30/23 0805   ondansetron  (ZOFRAN ) tablet 4 mg  4 mg Oral Q8H PRN Mills, Shnese E, NP       traZODone  (DESYREL ) tablet 100 mg  100 mg Oral QHS Carrion-Carrero, [name], MD   100 mg at 08/29/23 2106    Lab Results:  Results for orders placed or performed during the hospital encounter of 08/16/23 (from the past 48 hours)  Comprehensive metabolic panel with GFR     Status: Abnormal   Collection Time: 08/28/23  6:35 PM  Result Value Ref Range   Sodium 136 135 - 145 mmol/L   Potassium 3.9 3.5 - 5.1 mmol/L   Chloride 99 98 - 111 mmol/L   CO2 27 22 - 32 mmol/L   Glucose, Bld 113 (H) 70 - 99 mg/dL    Comment: Glucose reference range applies only to samples taken after fasting for at least 8 hours.   BUN 13 6 - 20 mg/dL   Creatinine, Ser 8.29 0.61 - 1.24 mg/dL   Calcium 8.7 (L) 8.9 - 10.3 mg/dL   Total Protein 6.6 6.5 - 8.1 g/dL   Albumin 3.9 3.5 - 5.0 g/dL   AST 15 15 - 41 U/L   ALT 14 0 - 44 U/L   Alkaline Phosphatase 50 38 - 126 U/L   Total Bilirubin 0.3 0.0 - 1.2 mg/dL   GFR, Estimated >56 >21 mL/min    Comment: (NOTE) Calculated using the CKD-EPI Creatinine Equation (2021)    Anion gap 10 5 - 15    Comment: Performed at [facility], [address], [place], [name] [address]  CBC     Status: None   Collection Time: 08/28/23  6:35 PM  Result Value Ref Range   WBC 6.8 4.0 - 10.5 K/uL   RBC 4.78 4.22 - 5.81 MIL/uL   Hemoglobin 14.5 13.0 - 17.0 g/dL   HCT 78.4 69.6 - 29.5 %   MCV 91.2 80.0 - 100.0 fL   MCH 30.3 26.0 - 34.0 pg   MCHC 33.3 30.0 - 36.0 g/dL   RDW 28.4 13.2 - 44.0 %   Platelets 225 150 - 400 K/uL   nRBC 0.0 0.0 - 0.2 %    Comment: Performed at [facility], [address] [place], [name] [address]  Valproic acid  level     Status: None   Collection Time: 08/28/23  6:35 PM  Result Value Ref  Range   Valproic Acid  Lvl 71 50 - 100 ug/mL    Comment: Performed at [facility], [address], [place], [name] [address]     Blood Alcohol level:  Lab Results  Component Value Date   [facility] <15 08/15/2023   ETH <10 05/17/2023    Metabolic Disorder Labs: Lab Results  Component Value Date   HGBA1C 4.9 05/17/2023   MPG 93.93 05/17/2023   MPG 93.93 09/29/2021   No results found for: "PROLACTIN" Lab Results  Component Value Date   CHOL 152 05/17/2023   TRIG 70 05/17/2023   HDL 78 05/17/2023   CHOLHDL 1.9 05/17/2023   VLDL 14 05/17/2023   LDLCALC 60 05/17/2023   LDLCALC 55 02/19/2020    Physical Findings: AIMS:  , ,  ,  ,    CIWA:    COWS:     Musculoskeletal: Strength & Muscle Tone: within normal limits Gait & Station: normal Patient leans: N/A  Psychiatric Specialty Exam:  Presentation  General Appearance:  Casual  Eye Contact: Fair  Speech: Normal Rate  Speech Volume: Increased  Handedness: Right   Mood and Affect  Mood: Anxious; Dysphoric  Affect: Restricted; Labile   Thought Process  Thought Processes: Disorganized  Descriptions of Associations:Loose  Orientation:Full (Time, Place and Person)  Thought Content:Rumination  History of Schizophrenia/Schizoaffective disorder:Yes  Duration  of Psychotic Symptoms:Less than six months  Hallucinations:Hallucinations: None  Ideas of Reference:None  Suicidal Thoughts:Suicidal Thoughts: No  Homicidal Thoughts:Homicidal Thoughts: No   Sensorium  Memory: Immediate Fair; Recent Fair  Judgment: Poor  Insight: Poor   Executive Functions  Concentration: Good  Attention Span: Good  Recall: Good  Fund of Knowledge: Good  Language: Good   Psychomotor Activity  Psychomotor Activity: Psychomotor Activity: Normal   Assets  Assets: Communication Skills; Social Support; Resilience; Housing   Sleep  Sleep: Sleep: Good Number of Hours of Sleep:  8    Physical Exam: Physical Exam Vitals and nursing note reviewed.  Constitutional:      General: He is not in acute distress.    Appearance: Normal appearance. He is normal weight. He is not ill-appearing or toxic-appearing.  HENT:     Head: Normocephalic and atraumatic.  Pulmonary:     Effort: Pulmonary effort is normal.  Musculoskeletal:        General: Normal range of motion.  Neurological:     General: No focal deficit present.     Mental Status: He is alert.    Review of Systems  Respiratory:  Negative for cough and shortness of breath.   Cardiovascular:  Negative for chest pain.  Gastrointestinal:  Negative for abdominal pain, constipation, diarrhea, nausea and vomiting.  Neurological:  Negative for dizziness, weakness and headaches.  Psychiatric/Behavioral:  Negative for depression, hallucinations and suicidal ideas. The patient is nervous/anxious.    Blood pressure 118/76, pulse 70, temperature (!) 97.3 F (36.3 C), temperature source Oral, resp. rate 18, height 5\' 11"  (1.803 m), weight 73.5 kg, SpO2 99%. Body mass index is 22.59 kg/m.   Treatment Plan Summary: Daily contact with patient to assess and evaluate symptoms and progress in treatment and Medication management  Ronald [name] is a 43 yr old male who presented on 5/2 to [facility] under IVC due to bizarre, aggressive behavior and hallucinations, he was admitted to [facility] on 5/4.  PPHx is significant for Schizoaffective Disorder, Bipolar Type, GAD, PTSD, and Polysubstance Abuse (Benzo, THC), and 5 Prior Psychiatric Hospitalizations (last- [facility] 05/2023).   Marcas continues to ruminate on discharge and needing to care for his mother.  However, compared to the interaction with the provider yesterday and today it does seem like the Haldol  is improving his symptoms as he was calmer today and reporting no anxiety attacks.  We will contact his mother tomorrow to get her opinion on his improvement.  We will not make any changes  to her medications at this time.  We will continue to monitor.    Schizoaffective Disorder, Bipolar Type: -Continue Depakote  1000 mg BID for mood stability -Continue Haldol  30 mg QHS PO or 15 mg IM for psychosis and mood stability -Non Emergent Medications Against Objection Order placed by Dr. Zouve on 5/8 -Continue Gabapentin  200 mg TID for mood stability -Continue Agitation Protocol: Haldol /Ativan    Nicotine  Dependence: -Continue Nicotine  Patch 14 mg daily -Continue Nicotine  Gum 2 mg PRN   -Continue Mag-Ox 400 mg QHS -Continue Multivitamin daily -Continue Trazodone  100 mg QHS -Continue PRN's: Tylenol , Maalox, Milk of Magnesia   --  The risks/benefits/side-effects/alternatives to medications were discussed in detail with the patient and time was given for questions. The patient consents to medication trials.                -- Metabolic profile and EKG monitoring obtained while on an atypical antipsychotic (BMI: Lipid Panel: HbgA1c: QTc:)              --  Encouraged patient to participate in unit milieu and in scheduled group therapies     Safety and Monitoring:             -- Involuntary admission to inpatient psychiatric unit for safety, stabilization and treatment             -- Daily contact with patient to assess and evaluate symptoms and progress in treatment             -- Patient's case to be discussed in multi-disciplinary team meeting             -- Observation Level : q15 minute checks             -- Vital signs:  q12 hours             -- Precautions: suicide, elopement, and assault  Discharge Planning:              -- Social work and case management to assist with discharge planning and identification of hospital follow-up needs prior to discharge             -- Estimated LOS: 2-4 more days             -- Discharge Concerns: Need to establish a safety plan; Medication compliance and effectiveness             -- Discharge Goals: Return home with outpatient referrals  for mental health follow-up including medication management/psychotherapy]   Basilia Bosworth, MD 08/30/2023, 11:18 AM

## 2023-08-31 MED ORDER — LORAZEPAM 0.5 MG PO TABS
0.5000 mg | ORAL_TABLET | Freq: Two times a day (BID) | ORAL | Status: DC | PRN
  Administered 2023-08-31 – 2023-09-02 (×4): 0.5 mg via ORAL
  Filled 2023-08-31 (×4): qty 1

## 2023-08-31 NOTE — Progress Notes (Signed)
   08/31/23 0800  Psych Admission Type (Psych Patients Only)  Admission Status Involuntary  Psychosocial Assessment  Patient Complaints Agitation;Anxiety  Eye Contact Brief  Facial Expression Animated  Affect Appropriate to circumstance  Speech Logical/coherent  Interaction Assertive  Motor Activity Ritualistic  Appearance/Hygiene Unremarkable  Behavior Characteristics Cooperative;Appropriate to situation  Mood Pleasant  Thought Process  Coherency WDL  Content WDL  Delusions None reported or observed  Perception WDL  Hallucination None reported or observed  Judgment Impaired  Confusion None  Danger to Self  Current suicidal ideation? Denies  Self-Injurious Behavior No self-injurious ideation or behavior indicators observed or expressed   Agreement Not to Harm Self Yes  Description of Agreement Verbal  Danger to Others  Danger to Others None reported or observed

## 2023-08-31 NOTE — Group Note (Signed)
 Date:  08/31/2023 Time:  9:27 AM  Group Topic/Focus:  Goals Group:   The focus of this group is to help patients establish daily goals to achieve during treatment and discuss how the patient can incorporate goal setting into their daily lives to aide in recovery. Orientation:   The focus of this group is to educate the patient on the purpose and policies of crisis stabilization and provide a format to answer questions about their admission.  The group details unit policies and expectations of patients while admitted.    Participation Level:  Did Not Attend

## 2023-08-31 NOTE — Progress Notes (Signed)
 [facility] MD Progress Note  08/31/2023 1:06 PM Ronald [name]  MRN:  [id number] Subjective:   Ronald [name] is a 43 yr old male who presented on 5/2 to [facility] under IVC due to bizarre, aggressive behavior and hallucinations, he was admitted to [facility] on 5/4.  PPHx is significant for Schizoaffective Disorder, Bipolar Type, GAD, PTSD, and Polysubstance Abuse (Benzo, THC), and 5 Prior Psychiatric Hospitalizations (last- [facility] 05/2023).  Case was discussed in the multidisciplinary team. MAR was reviewed and patient was compliant with medications.  He received PRN Ativan  X2 and Tylenol  yesterday.   Psychiatric Team made the following recommendations yesterday: -Continue Depakote  1000 mg BID for mood stability -Continue Haldol  30 mg QHS PO or 15 mg IM for psychosis and mood stability    On interview today patient reports he slept good last night.  He reports his appetite is doing good.  He reports no SI, HI, or AVH.  He reports no Paranoia or Ideas of Reference.  He reports no issues with his medications.  Discussed with him that we would be contacting his mother for her opinion on how he is doing.  He reports understanding.  He has no other concerns at present.   Called patient's mother, Ronald [name], [phone].  She reports that she thinks that he is sounding better on the phone.  She reports continued concern about him not taking his medications once he is discharged.  She reports that this last time he scared her and that he could have seriously hurt her.  She reports no other concerns at present.    Principal Problem: Schizoaffective disorder (HCC) Diagnosis: Principal Problem:   Schizoaffective disorder (HCC) Active Problems:   Substance induced mood disorder (HCC)  Total Time spent with patient:  I personally spent 35 minutes on the unit in direct patient care. The direct patient care time included face-to-face time with the patient, reviewing the patient's chart, communicating with other  professionals, and coordinating care. Greater than 50% of this time was spent in counseling or coordinating care with the patient regarding goals of hospitalization, psycho-education, and discharge planning needs.   Past Psychiatric History:  Schizoaffective Disorder, Bipolar Type, GAD, PTSD, and Polysubstance Abuse (Benzo, THC), and 5 Prior Psychiatric Hospitalizations (last- [facility] 05/2023).  Past Medical History:  Past Medical History:  Diagnosis Date   Anxiety    Arthritis    Disorder of pineal gland    Post traumatic stress disorder    Schizoaffective disorder, bipolar type (HCC) 08/29/2023   Severe manic bipolar 1 disorder with psychotic behavior (HCC) 08/29/2023    Past Surgical History:  Procedure Laterality Date   ANKLE SURGERY     Family History: History reviewed. No pertinent family history. Family Psychiatric  History:  No Known  Social History:  Social History   Substance and Sexual Activity  Alcohol Use No     Social History   Substance and Sexual Activity  Drug Use Yes   Types: Marijuana   Comment: one year ago    Social History   Socioeconomic History   Marital status: Single    Spouse name: Not on file   Number of children: Not on file   Years of education: Not on file   Highest education level: Not on file  Occupational History   Not on file  Tobacco Use   Smoking status: Never   Smokeless tobacco: Current    Types: Chew  Vaping Use   Vaping status: Never Used  Substance and  Sexual Activity   Alcohol use: No   Drug use: Yes    Types: Marijuana    Comment: one year ago   Sexual activity: Not Currently  Other Topics Concern   Not on file  Social History Narrative   Not on file   Social Drivers of Health   Financial Resource Strain: Not on file  Food Insecurity: Patient Declined (08/16/2023)   Hunger Vital Sign    Worried About Running Out of Food in the Last Year: Patient declined    Ran Out of Food in the Last Year: Patient declined   Transportation Needs: Patient Declined (08/16/2023)   PRAPARE - Administrator, Civil Service (Medical): Patient declined    Lack of Transportation (Non-Medical): Patient declined  Physical Activity: Not on file  Stress: Not on file  Social Connections: Not on file   Additional Social History:                         Sleep: Good    Appetite:  Good   Current Medications: Current Facility-Administered Medications  Medication Dose Route Frequency Provider Last Rate Last Admin   acetaminophen  (TYLENOL ) tablet 650 mg  650 mg Oral Q4H PRN Mills, Shnese E, NP   650 mg at 08/30/23 1631   alum & mag hydroxide-simeth (MAALOX/MYLANTA) 200-200-20 MG/5ML suspension 30 mL  30 mL Oral Q6H PRN Mills, Shnese E, NP       divalproex  (DEPAKOTE  ER) 24 hr tablet 1,000 mg  1,000 mg Oral q12n4p Izediuno, Vincent A, MD   1,000 mg at 08/31/23 0759   gabapentin  (NEURONTIN ) capsule 200 mg  200 mg Oral TID Mills, Shnese E, NP   200 mg at 08/31/23 0758   haloperidol  (HALDOL ) tablet 30 mg  30 mg Oral QHS Libbi Towner S, MD   30 mg at 08/30/23 2119   Or   haloperidol  lactate (HALDOL ) injection 15 mg  15 mg Intramuscular QHS Trew Sunde S, MD       haloperidol  (HALDOL ) tablet 5 mg  5 mg Oral TID PRN Mills, Shnese E, NP   5 mg at 08/20/23 0745   haloperidol  lactate (HALDOL ) injection 10 mg  10 mg Intramuscular TID PRN Doneen Fuelling, NP       haloperidol  lactate (HALDOL ) injection 5 mg  5 mg Intramuscular TID PRN Orvil Bland E, NP   5 mg at 08/21/23 1215   And   LORazepam  (ATIVAN ) injection 2 mg  2 mg Intramuscular TID PRN Mills, Shnese E, NP   2 mg at 08/21/23 1216   LORazepam  (ATIVAN ) tablet 0.5 mg  0.5 mg Oral BID PRN Basilia Bosworth, MD       magnesium  hydroxide (MILK OF MAGNESIA) suspension 30 mL  30 mL Oral Daily PRN Mills, Shnese E, NP       magnesium  oxide (MAG-OX) tablet 400 mg  400 mg Oral QHS Attiah, Nadir, MD   400 mg at 08/30/23 2122   multivitamin with  minerals tablet 1 tablet  1 tablet Oral Daily Carrion-Carrero, [name], MD   1 tablet at 08/31/23 0800   nicotine  (NICODERM CQ  - dosed in mg/24 hours) patch 14 mg  14 mg Transdermal Daily Attiah, Nadir, MD   14 mg at 08/31/23 0758   nicotine  polacrilex (NICORETTE ) gum 2 mg  2 mg Oral PRN Mills, Shnese E, NP   2 mg at 08/31/23 0804   ondansetron  (ZOFRAN ) tablet 4 mg  4 mg  Oral Q8H PRN Mills, Shnese E, NP       traZODone  (DESYREL ) tablet 100 mg  100 mg Oral QHS Carrion-Carrero, [name], MD   100 mg at 08/30/23 2122    Lab Results:  No results found for this or any previous visit (from the past 48 hours).    Blood Alcohol level:  Lab Results  Component Value Date   [facility] <15 08/15/2023   ETH <10 05/17/2023    Metabolic Disorder Labs: Lab Results  Component Value Date   HGBA1C 4.9 05/17/2023   MPG 93.93 05/17/2023   MPG 93.93 09/29/2021   No results found for: "PROLACTIN" Lab Results  Component Value Date   CHOL 152 05/17/2023   TRIG 70 05/17/2023   HDL 78 05/17/2023   CHOLHDL 1.9 05/17/2023   VLDL 14 05/17/2023   LDLCALC 60 05/17/2023   LDLCALC 55 02/19/2020    Physical Findings: AIMS:  , ,  ,  ,    CIWA:    COWS:     Musculoskeletal: Strength & Muscle Tone: within normal limits Gait & Station: normal Patient leans: N/A  Psychiatric Specialty Exam:  Presentation  General Appearance:  Casual  Eye Contact: Fair  Speech: Normal Rate  Speech Volume: Increased  Handedness: Right   Mood and Affect  Mood: Anxious; Dysphoric  Affect: Restricted; Labile   Thought Process  Thought Processes: Disorganized  Descriptions of Associations:Loose  Orientation:Full (Time, Place and Person)  Thought Content:Rumination  History of Schizophrenia/Schizoaffective disorder:Yes  Duration of Psychotic Symptoms:Less than six months  Hallucinations:Hallucinations: None  Ideas of Reference:None  Suicidal Thoughts:Suicidal Thoughts: No  Homicidal  Thoughts:Homicidal Thoughts: No   Sensorium  Memory: Immediate Fair; Recent Fair  Judgment: Poor  Insight: Poor   Executive Functions  Concentration: Good  Attention Span: Good  Recall: Good  Fund of Knowledge: Good  Language: Good   Psychomotor Activity  Psychomotor Activity: Psychomotor Activity: Normal   Assets  Assets: Communication Skills; Social Support; Resilience; Housing   Sleep  Sleep: Sleep: Good Number of Hours of Sleep: 8    Physical Exam: Physical Exam Vitals and nursing note reviewed.  Constitutional:      General: He is not in acute distress.    Appearance: Normal appearance. He is normal weight. He is not ill-appearing or toxic-appearing.  HENT:     Head: Normocephalic and atraumatic.  Pulmonary:     Effort: Pulmonary effort is normal.  Musculoskeletal:        General: Normal range of motion.  Neurological:     General: No focal deficit present.     Mental Status: He is alert.    Review of Systems  Respiratory:  Negative for cough and shortness of breath.   Cardiovascular:  Negative for chest pain.  Gastrointestinal:  Negative for abdominal pain, constipation, diarrhea, nausea and vomiting.  Neurological:  Negative for dizziness, weakness and headaches.  Psychiatric/Behavioral:  Negative for depression, hallucinations and suicidal ideas. The patient is not nervous/anxious.    Blood pressure 103/79, pulse 73, temperature 98 F (36.7 C), temperature source Oral, resp. rate 16, height 5\' 11"  (1.803 m), weight 73.5 kg, SpO2 98%. Body mass index is 22.59 kg/m.   Treatment Plan Summary: Daily contact with patient to assess and evaluate symptoms and progress in treatment and Medication management  ROHEN KIMES is a 43 yr old male who presented on 5/2 to [facility] under IVC due to bizarre, aggressive behavior and hallucinations, he was admitted to [facility] on 5/4.  PPHx  is significant for Schizoaffective Disorder, Bipolar Type, GAD,  PTSD, and Polysubstance Abuse (Benzo, THC), and 5 Prior Psychiatric Hospitalizations (last- [facility] 05/2023).   Yasseen does seem to be improving with the Haldol  and his mother does think he is improving.  She continues to have concerns about him taking his medications once home.  We will offer him an LAI tomorrow as this would be helpful.  We will not make any changes to his mediations at this time.  We will continue to monitor.    Schizoaffective Disorder, Bipolar Type: -Continue Depakote  1000 mg BID for mood stability -Continue Haldol  30 mg QHS PO or 15 mg IM for psychosis and mood stability -Non Emergent Medications Against Objection Order placed by Dr. Zouve on 5/8 -Continue Gabapentin  200 mg TID for mood stability -Continue Agitation Protocol: Haldol /Ativan    Nicotine  Dependence: -Continue Nicotine  Patch 14 mg daily -Continue Nicotine  Gum 2 mg PRN   -Decrease Ativan  to 0.5 mg PRN BID -Continue Mag-Ox 400 mg QHS -Continue Multivitamin daily -Continue Trazodone  100 mg QHS -Continue PRN's: Tylenol , Maalox, Milk of Magnesia   --  The risks/benefits/side-effects/alternatives to medications were discussed in detail with the patient and time was given for questions. The patient consents to medication trials.                -- Metabolic profile and EKG monitoring obtained while on an atypical antipsychotic (BMI: Lipid Panel: HbgA1c: QTc:)              -- Encouraged patient to participate in unit milieu and in scheduled group therapies     Safety and Monitoring:             -- Involuntary admission to inpatient psychiatric unit for safety, stabilization and treatment             -- Daily contact with patient to assess and evaluate symptoms and progress in treatment             -- Patient's case to be discussed in multi-disciplinary team meeting             -- Observation Level : q15 minute checks             -- Vital signs:  q12 hours             -- Precautions: suicide, elopement, and  assault  Discharge Planning:              -- Social work and case management to assist with discharge planning and identification of hospital follow-up needs prior to discharge             -- Estimated LOS: 2-4 more days             -- Discharge Concerns: Need to establish a safety plan; Medication compliance and effectiveness             -- Discharge Goals: Return home with outpatient referrals for mental health follow-up including medication management/psychotherapy]   Basilia Bosworth, MD 08/31/2023, 1:06 PM

## 2023-08-31 NOTE — Plan of Care (Signed)
  Problem: Activity: Goal: Interest or engagement in activities will improve Outcome: Progressing   Problem: Coping: Goal: Ability to verbalize frustrations and anger appropriately will improve Outcome: Progressing   Problem: Physical Regulation: Goal: Ability to maintain clinical measurements within normal limits will improve Outcome: Progressing   

## 2023-08-31 NOTE — Group Note (Signed)
 Date:  08/31/2023 Time:  9:43 PM  Group Topic/Focus:  Wrap-Up Group:   The focus of this group is to help patients review their daily goal of treatment and discuss progress on daily workbooks.    Participation Level:  Active  Participation Quality:  Appropriate and Attentive  Affect:  Appropriate  Cognitive:  Appropriate  Insight: Appropriate and Good  Engagement in Group:  Engaged  Modes of Intervention:  Discussion  Additional Comments:  Patient stated that he had a good day 9 out of 10 stated that he is working on his D/C  Ronald [name] 08/31/2023, 9:43 PM

## 2023-09-01 ENCOUNTER — Encounter (HOSPITAL_COMMUNITY): Payer: Self-pay

## 2023-09-01 MED ORDER — RISPERIDONE 1 MG PO TBDP: 1.0000 mg | ORAL_TABLET | Freq: Every day | ORAL | Status: DC

## 2023-09-01 NOTE — BH IP Treatment Plan (Signed)
 Interdisciplinary Treatment and Diagnostic Plan Update  09/01/2023 Time of Session: 1:15 PM - UPDATE Ronald [name] MRN: [id number]  Principal Diagnosis: Schizoaffective disorder (HCC)  Secondary Diagnoses: Principal Problem:   Schizoaffective disorder (HCC) Active Problems:   Substance induced mood disorder (HCC)   Current Medications:  Current Facility-Administered Medications  Medication Dose Route Frequency Provider Last Rate Last Admin   acetaminophen  (TYLENOL ) tablet 650 mg  650 mg Oral Q4H PRN Mills, Shnese E, NP   650 mg at 09/01/23 1630   alum & mag hydroxide-simeth (MAALOX/MYLANTA) 200-200-20 MG/5ML suspension 30 mL  30 mL Oral Q6H PRN Mills, Shnese E, NP       divalproex  (DEPAKOTE  ER) 24 hr tablet 1,000 mg  1,000 mg Oral q12n4p Izediuno, Vincent A, MD   1,000 mg at 09/01/23 0810   gabapentin  (NEURONTIN ) capsule 200 mg  200 mg Oral TID Mills, Shnese E, NP   200 mg at 09/01/23 1156   haloperidol  (HALDOL ) tablet 30 mg  30 mg Oral QHS Pashayan, Alexander S, DO   30 mg at 08/31/23 2104   Or   haloperidol  lactate (HALDOL ) injection 15 mg  15 mg Intramuscular QHS Pashayan, Alexander S, DO       haloperidol  (HALDOL ) tablet 5 mg  5 mg Oral TID PRN Mills, Shnese E, NP   5 mg at 08/20/23 0745   haloperidol  lactate (HALDOL ) injection 10 mg  10 mg Intramuscular TID PRN Mills, Shnese E, NP       haloperidol  lactate (HALDOL ) injection 5 mg  5 mg Intramuscular TID PRN Mills, Shnese E, NP   5 mg at 08/21/23 1215   And   LORazepam  (ATIVAN ) injection 2 mg  2 mg Intramuscular TID PRN Mills, Shnese E, NP   2 mg at 08/21/23 1216   LORazepam  (ATIVAN ) tablet 0.5 mg  0.5 mg Oral BID PRN Basilia Bosworth, DO   0.5 mg at 09/01/23 4098   magnesium  hydroxide (MILK OF MAGNESIA) suspension 30 mL  30 mL Oral Daily PRN Mills, Shnese E, NP       magnesium  oxide (MAG-OX) tablet 400 mg  400 mg Oral QHS [name], Nadir, MD   400 mg at 08/31/23 2104   multivitamin with minerals tablet 1 tablet  1 tablet Oral  Daily Carrion-Carrero, [name], MD   1 tablet at 09/01/23 0810   nicotine  (NICODERM CQ  - dosed in mg/24 hours) patch 14 mg  14 mg Transdermal Daily [name], Nadir, MD   14 mg at 09/01/23 1191   nicotine  polacrilex (NICORETTE ) gum 2 mg  2 mg Oral PRN Mills, Shnese E, NP   2 mg at 09/01/23 1156   ondansetron  (ZOFRAN ) tablet 4 mg  4 mg Oral Q8H PRN Mills, Shnese E, NP       risperiDONE  (RISPERDAL  M-TABS) disintegrating tablet 1 mg  1 mg Oral QHS Pashayan, Alexander S, DO       traZODone  (DESYREL ) tablet 100 mg  100 mg Oral QHS Carrion-Carrero, [name], MD   100 mg at 08/31/23 2104   PTA Medications: Medications Prior to Admission  Medication Sig Dispense Refill Last Dose/Taking   divalproex  (DEPAKOTE  ER) 500 MG 24 hr tablet Take 1 tablet (500 mg total) by mouth 2 (two) times daily. 60 tablet 3    gabapentin  (NEURONTIN ) 100 MG capsule Take 2 capsules (200 mg total) by mouth 3 (three) times daily. 180 capsule 3    Ibuprofen  200 MG CAPS Take 800 mg by mouth every 6 (six) hours as needed (  for headaches or pain).      LORazepam  (ATIVAN ) 1 MG tablet Take 1 mg by mouth every 8 (eight) hours as needed for anxiety.      MAGNESIUM  OXIDE, ELEMENTAL, PO Take 500 mg by mouth at bedtime.      Melatonin 10 MG TABS Take 10 mg by mouth at bedtime. 30 tablet 3    Multiple Vitamin (MULTIVITAMIN WITH MINERALS) TABS tablet Take 1 tablet by mouth daily with breakfast.      nicotine  (NICODERM CQ  - DOSED IN MG/24 HOURS) 14 mg/24hr patch Place 1 patch (14 mg total) onto the skin daily. (Patient not taking: Reported on 08/15/2023) 28 patch 0    nicotine  (NICODERM CQ  - DOSED IN MG/24 HOURS) 21 mg/24hr patch Place 21 mg onto the skin daily.      nicotine  polacrilex (NICORETTE ) 2 MG gum Take 1 each (2 mg total) by mouth every 4 (four) hours as needed for smoking cessation (while awake). (Patient taking differently: Take 2 mg by mouth every 2 (two) hours as needed for smoking cessation (while awake).) 100 tablet 0    Nutritional  Supplements (ENSURE ORIGINAL) LIQD Take 237 mLs by mouth daily.      QUEtiapine  (SEROQUEL ) 50 MG tablet Take 1 tablet (50 mg total) by mouth at bedtime. (Patient not taking: Reported on 08/15/2023) 7 tablet 0    traZODone  (DESYREL ) 50 MG tablet Take 1 tablet (50 mg total) by mouth at bedtime. 30 tablet 3     Patient Stressors: Financial difficulties   Medication change or noncompliance    Patient Strengths: Ability for Contractor for treatment/growth  Supportive family/friends   Treatment Modalities: Medication Management, Group therapy, Case management,  1 to 1 session with clinician, Psychoeducation, Recreational therapy.   Physician Treatment Plan for Primary Diagnosis: Schizoaffective disorder (HCC) Long Term Goal(s): Improvement in symptoms so as ready for discharge   Short Term Goals: Ability to identify changes in lifestyle to reduce recurrence of condition will improve Ability to verbalize feelings will improve Ability to disclose and discuss suicidal ideas Ability to demonstrate self-control will improve  Medication Management: Evaluate patient's response, side effects, and tolerance of medication regimen.  Therapeutic Interventions: 1 to 1 sessions, Unit Group sessions and Medication administration.  Evaluation of Outcomes: Progressing  Physician Treatment Plan for Secondary Diagnosis: Principal Problem:   Schizoaffective disorder (HCC) Active Problems:   Substance induced mood disorder (HCC)  Long Term Goal(s): Improvement in symptoms so as ready for discharge   Short Term Goals: Ability to identify changes in lifestyle to reduce recurrence of condition will improve Ability to verbalize feelings will improve Ability to disclose and discuss suicidal ideas Ability to demonstrate self-control will improve     Medication Management: Evaluate patient's response, side effects, and tolerance of medication regimen.  Therapeutic  Interventions: 1 to 1 sessions, Unit Group sessions and Medication administration.  Evaluation of Outcomes: Progressing   RN Treatment Plan for Primary Diagnosis: Schizoaffective disorder (HCC) Long Term Goal(s): Knowledge of disease and therapeutic regimen to maintain health will improve  Short Term Goals: Ability to remain free from injury will improve, Ability to verbalize frustration and anger appropriately will improve, Ability to verbalize feelings will improve, and Ability to disclose and discuss suicidal ideas  Medication Management: RN will administer medications as ordered by provider, will assess and evaluate patient's response and provide education to patient for prescribed medication. RN will report any adverse and/or side effects to prescribing provider.  Therapeutic  Interventions: 1 on 1 counseling sessions, Psychoeducation, Medication administration, Evaluate responses to treatment, Monitor vital signs and CBGs as ordered, Perform/monitor CIWA, COWS, AIMS and Fall Risk screenings as ordered, Perform wound care treatments as ordered.  Evaluation of Outcomes: Progressing   LCSW Treatment Plan for Primary Diagnosis: Schizoaffective disorder (HCC) Long Term Goal(s): Safe transition to appropriate next level of care at discharge, Engage patient in therapeutic group addressing interpersonal concerns.  Short Term Goals: Engage patient in aftercare planning with referrals and resources, Increase ability to appropriately verbalize feelings, Facilitate acceptance of mental health diagnosis and concerns, and Identify triggers associated with mental health/substance abuse issues  Therapeutic Interventions: Assess for all discharge needs, 1 to 1 time with Social worker, Explore available resources and support systems, Assess for adequacy in community support network, Educate family and significant other(s) on suicide prevention, Complete Psychosocial Assessment, Interpersonal group  therapy.  Evaluation of Outcomes: Progressing   Progress in Treatment: Attending groups: Yes. Participating in groups: Yes. Taking medication as prescribed: Yes. Toleration medication: Yes. Family/Significant other contact made: Yes, individual(s) contacted:  Consuella Denis [phone] Patient understands diagnosis: Yes. Discussing patient identified problems/goals with staff: Yes. Medical problems stabilized or resolved: Yes. Denies suicidal/homicidal ideation: Yes. Issues/concerns per patient self-inventory: No. Other: n/a   New problem(s) identified: No, Describe:  none   New Short Term/Long Term Goal(s): medication stabilization, elimination of SI thoughts, development of comprehensive mental wellness plan.    Patient Goals:  "Medication regulation and coping skills"   Discharge Plan or Barriers: Patient recently admitted. CSW will continue to follow and assess for appropriate referrals and possible discharge planning.     Reason for Continuation of Hospitalization: Anxiety Mania Medication stabilization Other; describe mood stabilization, discharge planning   Estimated Length of Stay:  2 - 3 days  Last 3 [place] Suicide Severity Risk Score: Flowsheet Row [facility] (Current) [name] from [facility]14/2023 in [facility] ED from 06/26/2023 in [facility] Department at [facility]  C-SSRS RISK CATEGORY No Risk No Risk No Risk       Last PHQ 2/9 Scores:    06/13/2023    8:06 AM 11/08/2022   10:00 AM 01/14/2022    1:09 PM  Depression screen PHQ 2/9  Decreased Interest 0 0 0  Down, Depressed, Hopeless 0 0 0  PHQ - 2 Score 0 0 0  Altered sleeping 0 0 0  Tired, decreased energy 0 0 0  Change in appetite 0 0 0  Feeling bad or failure about yourself  0 0 0  Trouble concentrating 0 0 0  Moving slowly or fidgety/restless 0 0 0  Suicidal thoughts 0 0 0   PHQ-9 Score 0 0 0  Difficult doing work/chores Not difficult at all      Scribe for Treatment Team: Kaien Pezzullo O Elvis Boot, LCSWA 09/01/2023 4:51 PM

## 2023-09-01 NOTE — [facility] Group Notes (Addendum)
 Adult Psychoeducational Group Note  Date:  09/01/2023 Time:  11:04 AM  Group Topic/Focus:  Goals Group:   The focus of this group is to help patients establish daily goals to achieve during treatment and discuss how the patient can incorporate goal setting into their daily lives to aide in recovery.  Participation Level:  Did Not Attend  Participation Quality:  na  Affect:  na  Cognitive:  na  Insight: na  Engagement in Group:  na  Modes of Intervention:  na  Additional Comments:  Pt did not attend group  Taejah Ohalloran 09/01/2023, 11:04 AM

## 2023-09-01 NOTE — Plan of Care (Signed)
  Problem: Coping: Goal: Ability to verbalize frustrations and anger appropriately will improve Outcome: Progressing   Problem: Safety: Goal: Periods of time without injury will increase Outcome: Progressing   Problem: Physical Regulation: Goal: Ability to maintain clinical measurements within normal limits will improve Outcome: Progressing

## 2023-09-01 NOTE — Group Note (Signed)
 Therapy Group Note  Group Topic:Other  Group Date: 09/01/2023 Start Time: 1430 End Time: 1509 Facilitators: Lynnda Sas, OT    The primary objective of this topic is to explore and understand the concept of occupational balance in the context of daily living. The term "occupational balance" is defined broadly, encompassing all activities that occupy an individual's time and energy, including self-care, leisure, and work-related tasks. The goal is to guide participants towards achieving a harmonious blend of these activities, tailored to their personal values and life circumstances. This balance is aimed at enhancing overall well-being, not by equally distributing time across activities, but by ensuring that daily engagements are fulfilling and not draining. The content delves into identifying various barriers that individuals face in achieving occupational balance, such as overcommitment, misaligned priorities, external pressures, and lack of effective time management. The impact of these barriers on occupational performance, roles, and lifestyles is examined, highlighting issues like reduced efficiency, strained relationships, and potential health problems. Strategies for cultivating occupational balance are a key focus. These strategies include practical methods like time blocking, prioritizing tasks, establishing self-care rituals, decluttering, connecting with nature, and engaging in reflective practices. These approaches are designed to be adaptable and applicable to a wide range of life scenarios, promoting a proactive and mindful approach to daily living. The overall aim is to equip participants with the knowledge and tools to create a balanced lifestyle that supports their mental, emotional, and physical health, thereby improving their functional performance in daily life.     Participation Level: Engaged   Participation Quality: Independent   Behavior: Appropriate   Speech/Thought  Process: Relevant   Affect/Mood: Appropriate   Insight: Fair   Judgement: Fair      Modes of Intervention: Education  Patient Response to Interventions:  Attentive   Plan: Continue to engage patient in OT groups 2 - 3x/week.  09/01/2023  Lynnda Sas, OT  Bradden Tadros, OT

## 2023-09-01 NOTE — Progress Notes (Signed)
 Adult Psychoeducational Group Note  Date:  09/01/2023 Time:  8:58 PM  Group Topic/Focus:  Wrap-Up Group:   The focus of this group is to help patients review their daily goal of treatment and discuss progress on daily workbooks.  Participation Level:  Active  Participation Quality:  Appropriate  Affect:  Appropriate  Cognitive:  Appropriate  Insight: Appropriate  Engagement in Group:  Engaged  Modes of Intervention:  Discussion  Additional Comments:  attend wrap up AA group.  Ronald [name] Long 09/01/2023, 8:58 PM

## 2023-09-01 NOTE — Group Note (Signed)
 Recreation Therapy Group Note   Group Topic:Stress Management  Group Date: 09/01/2023 Start Time: 0935 End Time: 1000 Facilitators: Ailea Rhatigan-McCall, LRT,CTRS Location: [address] Dayroom   Group Topic: Stress Management   Goal Area(s) Addresses:  Patient will actively participate in stress management techniques presented during session.  Patient will successfully identify benefit of practicing stress management post d/c.   Behavioral Response: N/A  Intervention: Relaxation exercise with ambient sound and script   Activity: Guided Imagery. LRT provided education, instruction, and demonstration on practice of visualization via guided imagery. Patient was asked to participate in the technique introduced during session. LRT debriefed including topics of mindfulness, stress management and specific scenarios each patient could use these techniques. Patients were given suggestions of ways to access scripts post d/c and encouraged to explore Youtube and other apps available on smartphones, tablets, and computers.  Education:  Stress Management, Discharge Planning.   Education Outcome: Acknowledges education  Clinical Observations/Feedback: Patient actively engaged in technique introduced, expressed no concerns and demonstrated ability to practice skill independently post d/c.    Affect/Mood: N/A   Participation Level: Did not attend    Clinical Observations/Individualized Feedback:    Plan: Continue to engage patient in RT group sessions 2-3x/week.   Neko Mcgeehan-McCall, LRT,CTRS 09/01/2023 12:11 PM

## 2023-09-01 NOTE — Progress Notes (Signed)
   09/01/23 0815  Psych Admission Type (Psych Patients Only)  Admission Status Involuntary  Psychosocial Assessment  Patient Complaints Agitation;Anxiety  Eye Contact Brief  Facial Expression Animated  Affect Appropriate to circumstance  Speech Logical/coherent  Interaction Assertive  Motor Activity Fidgety  Appearance/Hygiene Unremarkable  Behavior Characteristics Cooperative;Appropriate to situation  Mood Pleasant  Thought Process  Coherency WDL  Content WDL  Delusions None reported or observed  Perception WDL  Hallucination None reported or observed  Judgment Impaired  Confusion None  Danger to Self  Current suicidal ideation? Denies  Self-Injurious Behavior No self-injurious ideation or behavior indicators observed or expressed   Agreement Not to Harm Self Yes  Description of Agreement Verbal  Danger to Others  Danger to Others None reported or observed

## 2023-09-01 NOTE — Plan of Care (Signed)
   Problem: Education: Goal: Emotional status will improve Outcome: Progressing Goal: Mental status will improve Outcome: Progressing   Problem: Activity: Goal: Interest or engagement in activities will improve Outcome: Progressing Goal: Sleeping patterns will improve Outcome: Progressing

## 2023-09-01 NOTE — Progress Notes (Signed)
 [facility] MD Progress Note  09/01/2023 1:33 PM Ronald [name]  MRN:  [id number] Subjective:   Ronald [name] is a 43 yr old male who presented on 5/2 to [facility] under IVC due to bizarre, aggressive behavior and hallucinations, he was admitted to [facility] on 5/4.  PPHx is significant for Schizoaffective Disorder, Bipolar Type, GAD, PTSD, and Polysubstance Abuse (Benzo, THC), and 5 Prior Psychiatric Hospitalizations (last- [facility] 05/2023).  Case was discussed in the multidisciplinary team. MAR was reviewed and patient was compliant with medications.  He received PRN Ativan  X2 and Tylenol  yesterday.   Psychiatric Team made the following recommendations yesterday: -Continue Depakote  1000 mg BID for mood stability -Continue Haldol  30 mg QHS PO or 15 mg IM for psychosis and mood stability    On interview today patient reports he slept good last night.  He reports his appetite is doing good.  He reports no SI, HI, or AVH.  He reports no Paranoia or Ideas of Reference.  He reports no issues with his medications.  Discussed with him that his mother is afraid of him due to what happened prior to admission.  He reports that if this is the case he can go to a shelter until he can arrange an apartment. He reports that he has enough money in his bank account to afford that.  Offered an LAI to him but he declined saying he liked to take the pills.  He reports no other concerns at present.    Called patient's mother, Ronald [name], [phone].  She reports that he does not enough money to afford an apartment nor does he have silver.  She reports that he is saying things to get discharged.  She does report concern for checking as she reports his grandfather was known for doing that.  She reports she has an appointment at the cancer center on Thursday and would like to visit and we discussed this could be arranged as it would help establish a baseline.  She reports no other concerns at present.   Principal Problem:  Schizoaffective disorder (HCC) Diagnosis: Principal Problem:   Schizoaffective disorder (HCC) Active Problems:   Substance induced mood disorder (HCC)  Total Time spent with patient:  I personally spent 35 minutes on the unit in direct patient care. The direct patient care time included face-to-face time with the patient, reviewing the patient's chart, communicating with other professionals, and coordinating care. Greater than 50% of this time was spent in counseling or coordinating care with the patient regarding goals of hospitalization, psycho-education, and discharge planning needs.   Past Psychiatric History:  Schizoaffective Disorder, Bipolar Type, GAD, PTSD, and Polysubstance Abuse (Benzo, THC), and 5 Prior Psychiatric Hospitalizations (last- [facility] 05/2023).  Past Medical History:  Past Medical History:  Diagnosis Date   Anxiety    Arthritis    Disorder of pineal gland    Post traumatic stress disorder    Schizoaffective disorder, bipolar type (HCC) 08/29/2023   Severe manic bipolar 1 disorder with psychotic behavior (HCC) 08/29/2023    Past Surgical History:  Procedure Laterality Date   ANKLE SURGERY     Family History: History reviewed. No pertinent family history. Family Psychiatric  History:  No Known  Social History:  Social History   Substance and Sexual Activity  Alcohol Use No     Social History   Substance and Sexual Activity  Drug Use Yes   Types: Marijuana   Comment: one year ago    Social History  Socioeconomic History   Marital status: Single    Spouse name: Not on file   Number of children: Not on file   Years of education: Not on file   Highest education level: Not on file  Occupational History   Not on file  Tobacco Use   Smoking status: Never   Smokeless tobacco: Current    Types: Chew  Vaping Use   Vaping status: Never Used  Substance and Sexual Activity   Alcohol use: No   Drug use: Yes    Types: Marijuana    Comment: one year  ago   Sexual activity: Not Currently  Other Topics Concern   Not on file  Social History Narrative   Not on file   Social Drivers of Health   Financial Resource Strain: Not on file  Food Insecurity: Patient Declined (08/16/2023)   Hunger Vital Sign    Worried About Running Out of Food in the Last Year: Patient declined    Ran Out of Food in the Last Year: Patient declined  Transportation Needs: Patient Declined (08/16/2023)   PRAPARE - Administrator, Civil Service (Medical): Patient declined    Lack of Transportation (Non-Medical): Patient declined  Physical Activity: Not on file  Stress: Not on file  Social Connections: Not on file   Additional Social History:                         Sleep: Good    Appetite:  Good   Current Medications: Current Facility-Administered Medications  Medication Dose Route Frequency Provider Last Rate Last Admin   acetaminophen  (TYLENOL ) tablet 650 mg  650 mg Oral Q4H PRN Mills, Shnese E, NP   650 mg at 08/31/23 1624   alum & mag hydroxide-simeth (MAALOX/MYLANTA) 200-200-20 MG/5ML suspension 30 mL  30 mL Oral Q6H PRN Mills, Shnese E, NP       divalproex  (DEPAKOTE  ER) 24 hr tablet 1,000 mg  1,000 mg Oral q12n4p Izediuno, Vincent A, MD   1,000 mg at 09/01/23 0810   gabapentin  (NEURONTIN ) capsule 200 mg  200 mg Oral TID Mills, Shnese E, NP   200 mg at 09/01/23 1156   haloperidol  (HALDOL ) tablet 30 mg  30 mg Oral QHS Zakeria Kulzer S, DO   30 mg at 08/31/23 2104   Or   haloperidol  lactate (HALDOL ) injection 15 mg  15 mg Intramuscular QHS Nejla Reasor S, DO       haloperidol  (HALDOL ) tablet 5 mg  5 mg Oral TID PRN Mills, Shnese E, NP   5 mg at 08/20/23 0745   haloperidol  lactate (HALDOL ) injection 10 mg  10 mg Intramuscular TID PRN Mills, Shnese E, NP       haloperidol  lactate (HALDOL ) injection 5 mg  5 mg Intramuscular TID PRN Mills, Shnese E, NP   5 mg at 08/21/23 1215   And   LORazepam  (ATIVAN ) injection 2 mg  2 mg  Intramuscular TID PRN Mills, Shnese E, NP   2 mg at 08/21/23 1216   LORazepam  (ATIVAN ) tablet 0.5 mg  0.5 mg Oral BID PRN Basilia Bosworth, DO   0.5 mg at 09/01/23 2130   magnesium  hydroxide (MILK OF MAGNESIA) suspension 30 mL  30 mL Oral Daily PRN Mills, Shnese E, NP       magnesium  oxide (MAG-OX) tablet 400 mg  400 mg Oral QHS [name], Nadir, MD   400 mg at 08/31/23 2104   multivitamin with minerals  tablet 1 tablet  1 tablet Oral Daily Carrion-Carrero, [name], MD   1 tablet at 09/01/23 0810   nicotine  (NICODERM CQ  - dosed in mg/24 hours) patch 14 mg  14 mg Transdermal Daily Attiah, Nadir, MD   14 mg at 09/01/23 0454   nicotine  polacrilex (NICORETTE ) gum 2 mg  2 mg Oral PRN Mills, Shnese E, NP   2 mg at 09/01/23 1156   ondansetron  (ZOFRAN ) tablet 4 mg  4 mg Oral Q8H PRN Mills, Shnese E, NP       risperiDONE  (RISPERDAL  M-TABS) disintegrating tablet 1 mg  1 mg Oral QHS Desiray Orchard S, DO       traZODone  (DESYREL ) tablet 100 mg  100 mg Oral QHS Carrion-Carrero, [name], MD   100 mg at 08/31/23 2104    Lab Results:  No results found for this or any previous visit (from the past 48 hours).    Blood Alcohol level:  Lab Results  Component Value Date   [facility] <15 08/15/2023   ETH <10 05/17/2023    Metabolic Disorder Labs: Lab Results  Component Value Date   HGBA1C 4.9 05/17/2023   MPG 93.93 05/17/2023   MPG 93.93 09/29/2021   No results found for: "PROLACTIN" Lab Results  Component Value Date   CHOL 152 05/17/2023   TRIG 70 05/17/2023   HDL 78 05/17/2023   CHOLHDL 1.9 05/17/2023   VLDL 14 05/17/2023   LDLCALC 60 05/17/2023   LDLCALC 55 02/19/2020    Physical Findings: AIMS:  , ,  ,  ,    CIWA:    COWS:     Musculoskeletal: Strength & Muscle Tone: within normal limits Gait & Station: normal Patient leans: N/A  Psychiatric Specialty Exam:  Presentation  General Appearance:  Casual  Eye Contact: Fair  Speech: Clear and Coherent; Normal Rate  Speech  Volume: Normal  Handedness: Right   Mood and Affect  Mood: Dysphoric  Affect: Labile   Thought Process  Thought Processes: Linear  Descriptions of Associations:Intact  Orientation:Full (Time, Place and Person)  Thought Content:Delusions  History of Schizophrenia/Schizoaffective disorder:Yes  Duration of Psychotic Symptoms:Less than six months  Hallucinations:Hallucinations: None   Ideas of Reference:None  Suicidal Thoughts:Suicidal Thoughts: No   Homicidal Thoughts:Homicidal Thoughts: No    Sensorium  Memory: Immediate Fair; Recent Fair  Judgment: Poor  Insight: Poor   Executive Functions  Concentration: Good  Attention Span: Good  Recall: Good  Fund of Knowledge: Good  Language: Good   Psychomotor Activity  Psychomotor Activity: Psychomotor Activity: Normal    Assets  Assets: Communication Skills; Resilience; Social Support; Housing   Sleep  Sleep: Sleep: Good Number of Hours of Sleep: 7.25     Physical Exam: Physical Exam Vitals and nursing note reviewed.  Constitutional:      General: He is not in acute distress.    Appearance: Normal appearance. He is normal weight. He is not ill-appearing or toxic-appearing.  HENT:     Head: Normocephalic and atraumatic.  Pulmonary:     Effort: Pulmonary effort is normal.  Musculoskeletal:        General: Normal range of motion.  Neurological:     General: No focal deficit present.     Mental Status: He is alert.    Review of Systems  Respiratory:  Negative for cough and shortness of breath.   Cardiovascular:  Negative for chest pain.  Gastrointestinal:  Negative for abdominal pain, constipation, diarrhea, nausea and vomiting.  Neurological:  Negative for dizziness,  weakness and headaches.  Psychiatric/Behavioral:  Negative for depression, hallucinations and suicidal ideas. The patient is not nervous/anxious.    Blood pressure (!) 126/91, pulse 91, temperature 98 F  (36.7 C), temperature source Oral, resp. rate 16, height 5\' 11"  (1.803 m), weight 73.5 kg, SpO2 98%. Body mass index is 22.59 kg/m.   Treatment Plan Summary: Daily contact with patient to assess and evaluate symptoms and progress in treatment and Medication management  Ronald [name] is a 43 yr old male who presented on 5/2 to [facility] under IVC due to bizarre, aggressive behavior and hallucinations, he was admitted to [facility] on 5/4.  PPHx is significant for Schizoaffective Disorder, Bipolar Type, GAD, PTSD, and Polysubstance Abuse (Benzo, THC), and 5 Prior Psychiatric Hospitalizations (last- [facility] 05/2023).   Theodoros continues to be delusional and labile.  We will add a second generation antipsychotic as he has had response with the Haldol .  We will start Risperdal .  We will not make any other changes to his medications at this time.  His mother will come to visit on Thursday to see if we are approaching baseline.  We will continue to monitor.    Schizoaffective Disorder, Bipolar Type: -Continue Depakote  1000 mg BID for mood stability -Continue Haldol  30 mg QHS PO or 15 mg IM for psychosis and mood stability -Start Risperdal  1 mg QHS for psychosis and mood stability -Non Emergent Medications Against Objection Order placed by Dr. Zouve on 5/8 -Continue Gabapentin  200 mg TID for mood stability -Continue Agitation Protocol: Haldol /Ativan    Nicotine  Dependence: -Continue Nicotine  Patch 14 mg daily -Continue Nicotine  Gum 2 mg PRN   -Decrease Ativan  to 0.5 mg PRN BID -Continue Mag-Ox 400 mg QHS -Continue Multivitamin daily -Continue Trazodone  100 mg QHS -Continue PRN's: Tylenol , Maalox, Milk of Magnesia   --  The risks/benefits/side-effects/alternatives to medications were discussed in detail with the patient and time was given for questions. The patient consents to medication trials.                -- Metabolic profile and EKG monitoring obtained while on an atypical antipsychotic (BMI: Lipid  Panel: [id number]: QTc:)              -- Encouraged patient to participate in unit milieu and in scheduled group therapies     Safety and Monitoring:             -- Involuntary admission to inpatient psychiatric unit for safety, stabilization and treatment             -- Daily contact with patient to assess and evaluate symptoms and progress in treatment             -- Patient's case to be discussed in multi-disciplinary team meeting             -- Observation Level : q15 minute checks             -- Vital signs:  q12 hours             -- Precautions: suicide, elopement, and assault  Discharge Planning:              -- Social work and case management to assist with discharge planning and identification of hospital follow-up needs prior to discharge             -- Estimated LOS: 2-4 more days             -- Discharge Concerns: Need to establish a  safety plan; Medication compliance and effectiveness             -- Discharge Goals: Return home with outpatient referrals for mental health follow-up including medication management/psychotherapy]   Basilia Bosworth, DO 09/01/2023, 1:33 PM

## 2023-09-02 MED ORDER — NICOTINE 14 MG/24HR TD PT24: 14.0000 mg | MEDICATED_PATCH | Freq: Every day | TRANSDERMAL | 0 refills | Status: DC

## 2023-09-02 MED ORDER — HALOPERIDOL 10 MG PO TABS: 30.0000 mg | ORAL_TABLET | Freq: Every day | ORAL | 0 refills | Status: DC

## 2023-09-02 MED ORDER — DIVALPROEX SODIUM ER 500 MG PO TB24: 1000.0000 mg | ORAL_TABLET | Freq: Two times a day (BID) | ORAL | 0 refills | Status: DC

## 2023-09-02 MED ORDER — TRAZODONE HCL 100 MG PO TABS
100.0000 mg | ORAL_TABLET | Freq: Every day | ORAL | 0 refills | Status: DC
Start: 2023-09-02 — End: 2023-10-22

## 2023-09-02 NOTE — Plan of Care (Signed)
  Problem: Coping: Goal: Ability to verbalize frustrations and anger appropriately will improve Outcome: Progressing   Problem: Activity: Goal: Interest or engagement in activities will improve Outcome: Progressing   Problem: Safety: Goal: Periods of time without injury will increase Outcome: Progressing

## 2023-09-02 NOTE — Progress Notes (Signed)
 [facility] MD Progress Note  09/02/2023 9:50 AM MILT COYE  MRN:  [id number] Subjective:   Ronald [name] is a 43 yr old male who presented on 5/2 to [facility] under IVC due to bizarre, aggressive behavior and hallucinations, he was admitted to [facility] on 5/4.  PPHx is significant for Schizoaffective Disorder, Bipolar Type, GAD, PTSD, and Polysubstance Abuse (Benzo, THC), and 5 Prior Psychiatric Hospitalizations (last- [facility] 05/2023).  Case was discussed in the multidisciplinary team. MAR was reviewed and patient was compliant with most medications but did not take the Risperdal   He received PRN Tylenol  and Ativan  yesterday.   Psychiatric Team made the following recommendations yesterday: -Continue Depakote  1000 mg BID for mood stability -Continue Haldol  30 mg QHS PO or 15 mg IM for psychosis and mood stability -Start Risperdal  1 mg QHS for psychosis and mood stability   On interview today patient reports he slept good last night.  He reports his appetite is doing good.  He reports no SI, HI, or AVH.  He reports no Paranoia or Ideas of Reference.  He reports no issues with the medications he took  He reports that he will not take the Risperdal  because when he was on it in the past he had significant side effects to it.  He reports he is still willing to take all of his other medications but will not take the Risperdal .  Discussed with him that we will stop the Risperdal  and he was agreeable with this.  Discussed that his mother would be coming to visit him Thursday after an appointment that she has and he was glad to hear this.  He reports no other concerns at present.    Principal Problem: Schizoaffective disorder (HCC) Diagnosis: Principal Problem:   Schizoaffective disorder (HCC) Active Problems:   Substance induced mood disorder (HCC)  Total Time spent with patient:  I personally spent 35 minutes on the unit in direct patient care. The direct patient care time included face-to-face time with the  patient, reviewing the patient's chart, communicating with other professionals, and coordinating care. Greater than 50% of this time was spent in counseling or coordinating care with the patient regarding goals of hospitalization, psycho-education, and discharge planning needs.   Past Psychiatric History:  Schizoaffective Disorder, Bipolar Type, GAD, PTSD, and Polysubstance Abuse (Benzo, THC), and 5 Prior Psychiatric Hospitalizations (last- [facility] 05/2023).  Past Medical History:  Past Medical History:  Diagnosis Date   Anxiety    Arthritis    Disorder of pineal gland    Post traumatic stress disorder    Schizoaffective disorder, bipolar type (HCC) 08/29/2023   Severe manic bipolar 1 disorder with psychotic behavior (HCC) 08/29/2023    Past Surgical History:  Procedure Laterality Date   ANKLE SURGERY     Family History: History reviewed. No pertinent family history. Family Psychiatric  History:  No Known  Social History:  Social History   Substance and Sexual Activity  Alcohol Use No     Social History   Substance and Sexual Activity  Drug Use Yes   Types: Marijuana   Comment: one year ago    Social History   Socioeconomic History   Marital status: Single    Spouse name: Not on file   Number of children: Not on file   Years of education: Not on file   Highest education level: Not on file  Occupational History   Not on file  Tobacco Use   Smoking status: Never   Smokeless tobacco:  Current    Types: Chew  Vaping Use   Vaping status: Never Used  Substance and Sexual Activity   Alcohol use: No   Drug use: Yes    Types: Marijuana    Comment: one year ago   Sexual activity: Not Currently  Other Topics Concern   Not on file  Social History Narrative   Not on file   Social Drivers of Health   Financial Resource Strain: Not on file  Food Insecurity: Patient Declined (08/16/2023)   Hunger Vital Sign    Worried About Running Out of Food in the Last Year: Patient  declined    Ran Out of Food in the Last Year: Patient declined  Transportation Needs: Patient Declined (08/16/2023)   PRAPARE - Administrator, Civil Service (Medical): Patient declined    Lack of Transportation (Non-Medical): Patient declined  Physical Activity: Not on file  Stress: Not on file  Social Connections: Not on file   Additional Social History:                         Sleep: Good    Appetite:  Good   Current Medications: Current Facility-Administered Medications  Medication Dose Route Frequency Provider Last Rate Last Admin   acetaminophen  (TYLENOL ) tablet 650 mg  650 mg Oral Q4H PRN Mills, Shnese E, NP   650 mg at 09/01/23 1630   alum & mag hydroxide-simeth (MAALOX/MYLANTA) 200-200-20 MG/5ML suspension 30 mL  30 mL Oral Q6H PRN Mills, Shnese E, NP       divalproex  (DEPAKOTE  ER) 24 hr tablet 1,000 mg  1,000 mg Oral q12n4p Izediuno, Vincent A, MD   1,000 mg at 09/02/23 2130   gabapentin  (NEURONTIN ) capsule 200 mg  200 mg Oral TID Mills, Shnese E, NP   200 mg at 09/02/23 0757   haloperidol  (HALDOL ) tablet 30 mg  30 mg Oral QHS Riot Barrick S, DO   30 mg at 09/01/23 2108   Or   haloperidol  lactate (HALDOL ) injection 15 mg  15 mg Intramuscular QHS Cristal Howatt S, DO       haloperidol  (HALDOL ) tablet 5 mg  5 mg Oral TID PRN Mills, Shnese E, NP   5 mg at 08/20/23 0745   haloperidol  lactate (HALDOL ) injection 10 mg  10 mg Intramuscular TID PRN Mills, Shnese E, NP       haloperidol  lactate (HALDOL ) injection 5 mg  5 mg Intramuscular TID PRN Mills, Shnese E, NP   5 mg at 08/21/23 1215   And   LORazepam  (ATIVAN ) injection 2 mg  2 mg Intramuscular TID PRN Mills, Shnese E, NP   2 mg at 08/21/23 1216   LORazepam  (ATIVAN ) tablet 0.5 mg  0.5 mg Oral BID PRN Basilia Bosworth, DO   0.5 mg at 09/02/23 0757   magnesium  hydroxide (MILK OF MAGNESIA) suspension 30 mL  30 mL Oral Daily PRN Mills, Shnese E, NP       magnesium  oxide (MAG-OX) tablet 400 mg   400 mg Oral QHS Attiah, Nadir, MD   400 mg at 09/01/23 2106   multivitamin with minerals tablet 1 tablet  1 tablet Oral Daily Carrion-Carrero, [name], MD   1 tablet at 09/02/23 0757   nicotine  (NICODERM CQ  - dosed in mg/24 hours) patch 14 mg  14 mg Transdermal Daily Attiah, Nadir, MD   14 mg at 09/02/23 0757   nicotine  polacrilex (NICORETTE ) gum 2 mg  2 mg Oral PRN  Orvil Bland E, NP   2 mg at 09/01/23 2106   ondansetron  (ZOFRAN ) tablet 4 mg  4 mg Oral Q8H PRN Mills, Shnese E, NP       traZODone  (DESYREL ) tablet 100 mg  100 mg Oral QHS Carrion-Carrero, [name], MD   100 mg at 09/01/23 2106    Lab Results:  No results found for this or any previous visit (from the past 48 hours).    Blood Alcohol level:  Lab Results  Component Value Date   [facility] <15 08/15/2023   ETH <10 05/17/2023    Metabolic Disorder Labs: Lab Results  Component Value Date   HGBA1C 4.9 05/17/2023   MPG 93.93 05/17/2023   MPG 93.93 09/29/2021   No results found for: "PROLACTIN" Lab Results  Component Value Date   CHOL 152 05/17/2023   TRIG 70 05/17/2023   HDL 78 05/17/2023   CHOLHDL 1.9 05/17/2023   VLDL 14 05/17/2023   LDLCALC 60 05/17/2023   LDLCALC 55 02/19/2020    Physical Findings: AIMS:  , ,  ,  ,    CIWA:    COWS:     Musculoskeletal: Strength & Muscle Tone: within normal limits Gait & Station: normal Patient leans: N/A  Psychiatric Specialty Exam:  Presentation  General Appearance:  Casual  Eye Contact: Fair  Speech: Clear and Coherent; Normal Rate  Speech Volume: Normal  Handedness: Right   Mood and Affect  Mood: Dysphoric  Affect: Labile   Thought Process  Thought Processes: Linear  Descriptions of Associations:Intact  Orientation:Full (Time, Place and Person)  Thought Content:Delusions  History of Schizophrenia/Schizoaffective disorder:Yes  Duration of Psychotic Symptoms:Less than six months  Hallucinations:Hallucinations: None   Ideas of  Reference:None  Suicidal Thoughts:Suicidal Thoughts: No   Homicidal Thoughts:Homicidal Thoughts: No    Sensorium  Memory: Immediate Fair; Recent Fair  Judgment: Poor  Insight: Poor   Executive Functions  Concentration: Good  Attention Span: Good  Recall: Good  Fund of Knowledge: Good  Language: Good   Psychomotor Activity  Psychomotor Activity: Psychomotor Activity: Normal    Assets  Assets: Communication Skills; Resilience; Social Support; Housing   Sleep  Sleep: Sleep: Good Number of Hours of Sleep: 7.25     Physical Exam: Physical Exam Vitals and nursing note reviewed.  Constitutional:      General: He is not in acute distress.    Appearance: Normal appearance. He is normal weight. He is not ill-appearing or toxic-appearing.  HENT:     Head: Normocephalic and atraumatic.  Pulmonary:     Effort: Pulmonary effort is normal.  Musculoskeletal:        General: Normal range of motion.  Neurological:     General: No focal deficit present.     Mental Status: He is alert.    Review of Systems  Respiratory:  Negative for cough and shortness of breath.   Cardiovascular:  Negative for chest pain.  Gastrointestinal:  Negative for abdominal pain, constipation, diarrhea, nausea and vomiting.  Neurological:  Negative for dizziness, weakness and headaches.  Psychiatric/Behavioral:  Negative for depression, hallucinations and suicidal ideas. The patient is not nervous/anxious.    Blood pressure 121/89, pulse 91, temperature 98 F (36.7 C), temperature source Oral, resp. rate 16, height 5\' 11"  (1.803 m), weight 73.5 kg, SpO2 97%. Body mass index is 22.59 kg/m.   Treatment Plan Summary: Daily contact with patient to assess and evaluate symptoms and progress in treatment and Medication management  Ronald [name] is a 43  yr old male who presented on 5/2 to [facility] under IVC due to bizarre, aggressive behavior and hallucinations, he was admitted to  [facility] on 5/4.  PPHx is significant for Schizoaffective Disorder, Bipolar Type, GAD, PTSD, and Polysubstance Abuse (Benzo, THC), and 5 Prior Psychiatric Hospitalizations (last- [facility] 05/2023).   Webber refused the Risperdal  last night and reports this is due to side effects from when he took it in the past.  He continues to have some paranoia and delusions but he is not as labile as he was yesterday so he is continuing to improve.  We will stop the Risperdal  as he will not take it but is still willing to take the Haldol .  We will continue to monitor.   Schizoaffective Disorder, Bipolar Type: -Continue Depakote  1000 mg BID for mood stability -Continue Haldol  30 mg QHS PO or 15 mg IM for psychosis and mood stability -Stop Risperdal   -Continue Gabapentin  200 mg TID for mood stability -Continue Agitation Protocol: Haldol /Ativan    Nicotine  Dependence: -Continue Nicotine  Patch 14 mg daily -Continue Nicotine  Gum 2 mg PRN   -Decrease Ativan  to 0.5 mg PRN BID -Continue Mag-Ox 400 mg QHS -Continue Multivitamin daily -Continue Trazodone  100 mg QHS -Continue PRN's: Tylenol , Maalox, Milk of Magnesia   --  The risks/benefits/side-effects/alternatives to medications were discussed in detail with the patient and time was given for questions. The patient consents to medication trials.                -- Metabolic profile and EKG monitoring obtained while on an atypical antipsychotic (BMI: Lipid Panel: HbgA1c: QTc:)              -- Encouraged patient to participate in unit milieu and in scheduled group therapies     Safety and Monitoring:             -- Involuntary admission to inpatient psychiatric unit for safety, stabilization and treatment             -- Daily contact with patient to assess and evaluate symptoms and progress in treatment             -- Patient's case to be discussed in multi-disciplinary team meeting             -- Observation Level : q15 minute checks             -- Vital signs:   q12 hours             -- Precautions: suicide, elopement, and assault  Discharge Planning:              -- Social work and case management to assist with discharge planning and identification of hospital follow-up needs prior to discharge             -- Estimated LOS: 2-4 more days             -- Discharge Concerns: Need to establish a safety plan; Medication compliance and effectiveness             -- Discharge Goals: Return home with outpatient referrals for mental health follow-up including medication management/psychotherapy]   Basilia Bosworth, DO 09/02/2023, 9:50 AM

## 2023-09-02 NOTE — Progress Notes (Addendum)
  [facility] Adult Case Management Discharge Plan :  Will you be returning to the same living situation after discharge:  Yes,  patient will return to his mom's home, [address] [place] At discharge, do you have transportation home?: Yes,  patient's' mom, Biagio Bucy. Bernetta Brilliant will pick him up after 4 PM Do you have the ability to pay for your medications:  patient will receive samples for 7 days  Release of information consent forms completed and in the chart;  Patient's signature needed at discharge.  Patient to Follow up at:  Follow-up Information     [facility]. Go on 09/09/2023.   Specialty: Behavioral Health Why: You have an appointment for medication management services on 09/09/23 at 2:00 pm. The appointment will be held in person. * Please call /confirm your appointment or it will be cancelled. Contact information: [address] [place]  [phone]        [facility]. Go on 09/03/2023.   Specialty: Professional Counselor Why: Please go to this provider on 09/03/23 at 9:00 am for an assessment, to obtain therapy services. You may also go on Monday through Friday, between 9 am to 1 pm for an assessment, during the walk in hours for new patients. Contact information: [address] [place]  [address] [name] [address] [phone]                 Next level of care provider has access to [facility] Link:no  Safety Planning and Suicide Prevention discussed: Yes,  [facility] Bernetta Brilliant [phone]  Has patient been referred to the Quitline?: Patient refused referral for treatment.  Patient admitted to using tobacco (he holds tobacco in his mouth).  He doesn't want to stop using at this time.  He said it doesn't damage his teeth.  Patient has been referred for addiction treatment: Patient refused referral for treatment.  Patient said that she occasionally uses medical marijuana (he has permission from a doctor).  He said he uses  marijuana once per day or once per month.  He said he has has permission to grow it (it's legal).   Ozil Stettler O Richerd Grime, LCSWA 09/02/2023, 3:02 PM

## 2023-09-02 NOTE — Progress Notes (Signed)
   09/02/23 0000  Psych Admission Type (Psych Patients Only)  Admission Status Involuntary  Psychosocial Assessment  Patient Complaints Anxiety  Eye Contact Brief  Facial Expression Animated  Affect Appropriate to circumstance  Speech Logical/coherent  Interaction Assertive  Motor Activity Fidgety  Appearance/Hygiene Unremarkable  Behavior Characteristics Cooperative;Appropriate to situation  Mood Pleasant  Aggressive Behavior  Effect No apparent injury  Thought Process  Coherency WDL  Content WDL  Delusions WDL  Perception WDL  Hallucination None reported or observed  Judgment WDL  Confusion WDL  Danger to Self  Current suicidal ideation? Denies  Danger to Others  Danger to Others None reported or observed

## 2023-09-02 NOTE — Group Note (Signed)
 Recreation Therapy Group Note   Group Topic:Animal Assisted Therapy   Group Date: 09/02/2023 Start Time: 0945 End Time: 1030 Facilitators: Jaycie Kregel-McCall, LRT,CTRS Location: [address] Dayroom   Animal-Assisted Activity (AAA) Program Checklist/Progress Notes Patient Eligibility Criteria Checklist & Daily Group note for Rec Tx Intervention  AAA/T Program Assumption of Risk Form signed by Patient/ or Parent Legal Guardian Yes  Patient understands his/her participation is voluntary Yes  Behavioral Response:    Education: Charity fundraiser, Appropriate Animal Interaction   Education Outcome: Acknowledges education.    Affect/Mood: N/A   Participation Level: Did not attend    Clinical Observations/Individualized Feedback:     Plan: Continue to engage patient in RT group sessions 2-3x/week.   Ronald [name], LRT,CTRS 09/02/2023 1:18 PM

## 2023-09-02 NOTE — Progress Notes (Signed)
  Ronald [name]   Type of Note: Court Hearing  Patient attended court hearing today for IVC. Alyn Babe ordered immediate discharge for patient by end of day.  MD and RN aware. Pt agreeable.   Signed:  Eleri Ruben, LCSW-A 09/02/2023  4:03 PM

## 2023-09-02 NOTE — Progress Notes (Signed)
 D/c Instructions-Education: Caroline Cinnamon Discharge instructions given to patient, and he verbalized understanding. D/c education completed with patient including follow up instructions, medication list, d/c activities limitations if indicated, with other d/c instructions as indicated by MD - patient able to verbalize understanding, all questions fully answered. Patient denied SI/HI/AVH. Suicide prevention information given and discussed with patient and understanding of D/C information verbalized by patient. All questions asked were answered by staff, patient stated they have no questions at time of D/C. Patient received all belongings brought to the unit at discharge. Patient showed appreciation of care given to him at the [facility] unit. Patient was escorted to the lobby and left for home in private auto at 1630.    Jeoffrey Mole, RN

## 2023-09-02 NOTE — Discharge Summary (Signed)
 Physician Discharge Summary Note  Patient:  Ronald [name] is an 43 y.o., male MRN:  [id number] DOB:  27-Feb-1981 Patient phone:  [phone] (home)  Patient address:   [phone] [place] [address],  Total Time spent with patient: 20 minutes  Date of Admission:  08/16/2023 Date of Discharge: 09/02/2023  Reason for Admission:   Ronald [name] is a 43 y.o. male  with a past psychiatric history of medication noncompliance, GAD, PTSD, schizoaffective d/o bipolar type, cannabis use disorder, benzodiazepine abuse, with prior psychiatric hospitalizations x5 (last 05/18/2023 at [facility] for worsening aggressive behaviors and paranoia). Patient initially arrived to [facility] on 08/14/2023 for aggressive and manic behaviors, subsequently IVC'd and admitted to [facility] under IVC status on 08/16/2023 for psychiatric stabilization. UDS on admission is positive for THC. PMHx is significant for traumatic subdural hematoma and multiple concussions.   Patient was evaluated today and appeared labile throughout the interview, alternating between periods of weeping and abrupt return to baseline speech without clear transitions. He reports a longstanding history of anxiety and mood symptoms, with worsening over the past month in the context of psychosocial stressors. He endorsed poor sleep (averaging 5 hours/night) and elevated anxiety related to his mother's recent cancer diagnosis, expressing fear that she may die. During the interview, he tearfully recalled that she has lost her hair and intermittently wept while discussing her illness. He also became tearful when recalling his brother's death in September 22, 2010, stating he was murdered. The emotional intensity fluctuated rapidly throughout the interview, and his narrative was disorganized at times. He referenced a male patient from an "insane asylum" who had visited in the past and described a recent phone interaction where she "sounded irate." He stated he told his mother about this  concern and believes she called 911 out of worry, although he expressed some confusion about the sequence of events.   Principal Problem: Schizoaffective disorder [facility]) Discharge Diagnoses: Principal Problem:   Schizoaffective disorder (HCC) Active Problems:   Substance induced mood disorder (HCC)   Past Psychiatric History:  Schizoaffective Disorder, Bipolar Type, GAD, PTSD, and Polysubstance Abuse (Benzo, THC), and 5 Prior Psychiatric Hospitalizations (last- [facility] 05/2023).   Past Medical History:  Past Medical History:  Diagnosis Date   Anxiety    Arthritis    Disorder of pineal gland    Post traumatic stress disorder    Schizoaffective disorder, bipolar type (HCC) 08/29/2023   Severe manic bipolar 1 disorder with psychotic behavior (HCC) 08/29/2023    Past Surgical History:  Procedure Laterality Date   ANKLE SURGERY     Family History: History reviewed. No pertinent family history. Family Psychiatric  History:  No Known   Social History:  Social History   Substance and Sexual Activity  Alcohol Use No     Social History   Substance and Sexual Activity  Drug Use Yes   Types: Marijuana   Comment: one year ago    Social History   Socioeconomic History   Marital status: Single    Spouse name: Not on file   Number of children: Not on file   Years of education: Not on file   Highest education level: Not on file  Occupational History   Not on file  Tobacco Use   Smoking status: Never   Smokeless tobacco: Current    Types: Chew  Vaping Use   Vaping status: Never Used  Substance and Sexual Activity   Alcohol use: No   Drug use: Yes    Types:  Marijuana    Comment: one year ago   Sexual activity: Not Currently  Other Topics Concern   Not on file  Social History Narrative   Not on file   Social Drivers of Health   Financial Resource Strain: Not on file  Food Insecurity: Patient Declined (08/16/2023)   Hunger Vital Sign    Worried About Running Out of Food  in the Last Year: Patient declined    Ran Out of Food in the Last Year: Patient declined  Transportation Needs: Patient Declined (08/16/2023)   PRAPARE - Administrator, Civil Service (Medical): Patient declined    Lack of Transportation (Non-Medical): Patient declined  Physical Activity: Not on file  Stress: Not on file  Social Connections: Not on file    Hospital Course:   During the patient's hospitalization, patient had extensive initial psychiatric evaluation, and follow-up psychiatric evaluations every day.  Psychiatric diagnoses provided upon initial assessment: Schizoaffective Disorder, Bipolar Type   Patient's psychiatric medications were adjusted on admission:  Increase home Depakote .  Restarted home Gabapentin    During the hospitalization, other adjustments were made to the patient's psychiatric medication regimen: His Depakote  was titrated. Haldol  was started and titrated.   Patient's care was discussed during the interdisciplinary team meeting every day during the hospitalization.  The patient is not having side effects to prescribed psychiatric medication.  Gradually, patient started adjusting to milieu. The patient was evaluated each day by a clinical provider to ascertain response to treatment. Improvement was noted by the patient's report of decreasing symptoms, improved sleep and appetite, affect, medication tolerance, behavior, and participation in unit programming.  Patient was asked each day to complete a self inventory noting mood, mental status, pain, new symptoms, anxiety and concerns.   Symptoms were reported as significantly decreased or resolved completely by discharge.  The patient reports that their mood is stable.  The patient denied having suicidal thoughts for more than 48 hours prior to discharge.  Patient denies having homicidal thoughts.  Patient denies having auditory hallucinations.  Patient denies any visual hallucinations or other symptoms of  psychosis.  The patient was motivated to continue taking medication with a goal of continued improvement in mental health.   The patient reports their target psychiatric symptoms of mania responded well to the psychiatric medications, and the patient reports overall benefit other psychiatric hospitalization. Supportive psychotherapy was provided to the patient. The patient also participated in regular group therapy while hospitalized. Coping skills, problem solving as well as relaxation therapies were also part of the unit programming.  Labs were reviewed with the patient, and abnormal results were discussed with the patient.  The patient is able to verbalize their individual safety plan to this provider.  # It is recommended to the patient to continue psychiatric medications as prescribed, after discharge from the hospital.    # It is recommended to the patient to follow up with your outpatient psychiatric provider and PCP.  # It was discussed with the patient, the impact of alcohol, drugs, tobacco have been there overall psychiatric and medical wellbeing, and total abstinence from substance use was recommended the patient.ed.  # Prescriptions provided or sent directly to preferred pharmacy at discharge. Patient agreeable to plan. Given opportunity to ask questions. Appears to feel comfortable with discharge.    # In the event of worsening symptoms, the patient is instructed to call the crisis hotline, 911 and or go to the nearest ED for appropriate evaluation and treatment of symptoms.  To follow-up with primary care provider for other medical issues, concerns and or health care needs  # Patient was discharged home with a plan to follow up as noted below.    At his court hearing the judge ordered that he be discharged today.  Discussed with patient that we are discharging him.  He reports that he feels better.  Discussed with him the importance of continuing to take his medication as  prescribed.  Discussed with him the importance of attending his outpatient appointments.  He reported understanding and that he would do so.  He reports no side effects to his medications.  He reports no SI, HI, or AVH.  He reports his sleep is good.  He reports his appetite is doing good.  He reports no other concerns at present.  He was discharged home with his mother.    Called his mother, [name], [phone].  Discussed with her that the judge had ordered his discharge.  Discussed that he would be provided with written scripts and sample medications.  Discussed with her that we had planned on trialing Abilify  along with the already prescribed Haldol  tomorrow and so this could be discussed with his outpatient provider.  She reports continued concern about him not taking his medication.  She reports she will pick him up.  She reports no other concerns at present.   Physical Findings: AIMS: Facial and Oral Movements Muscles of Facial Expression: None Lips and Perioral Area: None Jaw: None Tongue: None,Extremity Movements Upper (arms, wrists, hands, fingers): None Lower (legs, knees, ankles, toes): None, Trunk Movements Neck, shoulders, hips: None, Global Judgements Severity of abnormal movements overall : None Incapacitation due to abnormal movements: None Patient's awareness of abnormal movements: No Awareness,    CIWA:    COWS:     Musculoskeletal: Strength & Muscle Tone: within normal limits Gait & Station: normal Patient leans: N/A   Psychiatric Specialty Exam:  Presentation  General Appearance:  Casual; Appropriate for Environment  Eye Contact: Fair  Speech: Clear and Coherent; Normal Rate  Speech Volume: Normal  Handedness: Right   Mood and Affect  Mood: -- ("ok")  Affect: Constricted   Thought Process  Thought Processes: Linear  Descriptions of Associations:Intact  Orientation:Full (Time, Place and Person)  Thought  Content:Delusions  History of Schizophrenia/Schizoaffective disorder:Yes  Duration of Psychotic Symptoms:Less than six months  Hallucinations:Hallucinations: None  Ideas of Reference:-- (some paranoia present)  Suicidal Thoughts:Suicidal Thoughts: No  Homicidal Thoughts:Homicidal Thoughts: No   Sensorium  Memory: Immediate Fair  Judgment: Poor  Insight: Poor   Executive Functions  Concentration: Good  Attention Span: Good  Recall: Good  Fund of Knowledge: Good  Language: Good   Psychomotor Activity  Psychomotor Activity: Psychomotor Activity: Normal   Assets  Assets: Communication Skills; Desire for Improvement; Resilience; Social Support   Sleep  Sleep: Sleep: Good Number of Hours of Sleep: 8    Physical Exam: Physical Exam Vitals and nursing note reviewed.  Constitutional:      General: He is not in acute distress.    Appearance: Normal appearance. He is normal weight. He is not ill-appearing or toxic-appearing.  HENT:     Head: Normocephalic and atraumatic.  Pulmonary:     Effort: Pulmonary effort is normal.  Musculoskeletal:        General: Normal range of motion.  Neurological:     General: No focal deficit present.     Mental Status: He is alert.    Review of Systems  Respiratory:  Negative for cough and shortness of breath.   Cardiovascular:  Negative for chest pain.  Gastrointestinal:  Negative for abdominal pain, constipation, diarrhea, nausea and vomiting.  Neurological:  Negative for dizziness, weakness and headaches.  Psychiatric/Behavioral:  Negative for depression, hallucinations and suicidal ideas. The patient is not nervous/anxious.    Blood pressure 121/89, pulse 91, temperature 98 F (36.7 C), temperature source Oral, resp. rate 16, height 5\' 11"  (1.803 m), weight 73.5 kg, SpO2 97%. Body mass index is 22.59 kg/m.   Social History   Tobacco Use  Smoking Status Never  Smokeless Tobacco Current   Types: Chew    Tobacco Cessation:  A prescription for an FDA-approved tobacco cessation medication provided at discharge   Blood Alcohol level:  Lab Results  Component Value Date   [facility] <15 08/15/2023   ETH <10 05/17/2023    Metabolic Disorder Labs:  Lab Results  Component Value Date   HGBA1C 4.9 05/17/2023   MPG 93.93 05/17/2023   MPG 93.93 09/29/2021   No results found for: "PROLACTIN" Lab Results  Component Value Date   CHOL 152 05/17/2023   TRIG 70 05/17/2023   HDL 78 05/17/2023   CHOLHDL 1.9 05/17/2023   VLDL 14 05/17/2023   LDLCALC 60 05/17/2023   LDLCALC 55 02/19/2020    See Psychiatric Specialty Exam and Suicide Risk Assessment completed by Attending Physician prior to discharge.  Discharge destination:  Home  Is patient on multiple antipsychotic therapies at discharge:  No   Has Patient had three or more failed trials of antipsychotic monotherapy by history:  No  Recommended Plan for Multiple Antipsychotic Therapies: NA  Discharge Instructions     Diet - low sodium heart healthy   Complete by: As directed    Increase activity slowly   Complete by: As directed       Allergies as of 09/02/2023       Reactions   Hydroxyzine  Itching, Anxiety, Other (See Comments)   "Hyper," too   Alupent [metaproterenol] Other (See Comments)   Hyperactivity   Benadryl [diphenhydramine] Other (See Comments)   Pt states that this makes him "feel insane"   Klonopin  [clonazepam ] Other (See Comments)   Patient feels Ativan  is a better fit for him than Klonopin - effect is not as strong.   Other Other (See Comments)   Pt reports that all antihistamines make him "feel insane"   Quetiapine     Hallucinations   Zyprexa  [olanzapine ] Other (See Comments)   "This made me feel weird."        Medication List     STOP taking these medications    Ensure Original Liqd   Ibuprofen  200 MG Caps   LORazepam  1 MG tablet Commonly known as: ATIVAN    Melatonin 10 MG Tabs   nicotine   polacrilex 2 MG gum Commonly known as: NICORETTE    QUEtiapine  50 MG tablet Commonly known as: SEROquel        TAKE these medications      Indication  divalproex  500 MG 24 hr tablet Commonly known as: DEPAKOTE  ER Take 2 tablets (1,000 mg total) by mouth 2 times daily at 12 noon and 4 pm. What changed:  how much to take when to take this  Indication: Manic Phase of Manic-Depression   gabapentin  100 MG capsule Commonly known as: NEURONTIN  Take 2 capsules (200 mg total) by mouth 3 (three) times daily.  Indication: Generalized Anxiety Disorder, Agitation.   haloperidol  10 MG tablet Commonly known as: HALDOL  Take 3  tablets (30 mg total) by mouth at bedtime.  Indication: Manic Phase of Manic-Depression   MAGNESIUM  OXIDE (ELEMENTAL) PO Take 500 mg by mouth at bedtime.  Indication: Constipation   multivitamin with minerals Tabs tablet Take 1 tablet by mouth daily with breakfast.  Indication: vitamin   nicotine  14 mg/24hr patch Commonly known as: NICODERM CQ  - dosed in mg/24 hours Place 1 patch (14 mg total) onto the skin daily. Start taking on: Sep 03, 2023 What changed: Another medication with the same name was removed. Continue taking this medication, and follow the directions you see here.  Indication: Nicotine  Addiction   traZODone  100 MG tablet Commonly known as: DESYREL  Take 1 tablet (100 mg total) by mouth at bedtime. What changed:  medication strength how much to take  Indication: Trouble Sleeping        Follow-up Information     [facility]. Go on 09/09/2023.   Specialty: Behavioral Health Why: You have an appointment for medication management services on 09/09/23 at 2:00 pm. The appointment will be held in person. * Please call /confirm your appointment or it will be cancelled. Contact information: [address] [place]  [phone]        [facility]. Go on 09/03/2023.    Specialty: Professional Counselor Why: Please go to this provider on 09/03/23 at 9:00 am for an assessment, to obtain therapy services. You may also go on Monday through Friday, between 9 am to 1 pm for an assessment, during the walk in hours for new patients. Contact information: [address] [place]  [address] [name] [address] [phone]                 Follow-up recommendations/Comments:   Activity: as tolerated   Diet: heart healthy   Other: -Follow-up with your outpatient psychiatric provider -instructions on appointment date, time, and address (location) are provided to you in discharge paperwork.   -Take your psychiatric medications as prescribed at discharge - instructions are provided to you in the discharge paperwork   -Follow-up with outpatient primary care doctor and other specialists -for management of chronic medical disease, including: routine care   -Testing: Follow-up with outpatient provider for abnormal lab results: Mildly low Calcium   -Recommend abstinence from alcohol, tobacco, and other illicit drug use at discharge.    -If your psychiatric symptoms recur, worsen, or if you have side effects to your psychiatric medications, call your outpatient psychiatric provider, 911, [phone] or go to the nearest emergency department.   -If suicidal thoughts recur, call your outpatient psychiatric provider, 911, [phone] or go to the nearest emergency department.  Signed: Basilia Bosworth, DO 09/02/2023, 2:54 PM

## 2023-09-02 NOTE — Group Note (Signed)
 Date:  09/02/2023 Time:  10:40 AM  Group Topic/Focus:  Goals Group:   The focus of this group is to help patients establish daily goals to achieve during treatment and discuss how the patient can incorporate goal setting into their daily lives to aide in recovery.    Participation Level:  Did Not Attend  Participation Quality:  Did Not Attend  Affect:  Did Not Attend  Cognitive:  Did Not Attend  Insight: None  Engagement in Group:  Did Not Attend  Modes of Intervention:  Did Not Attend  Additional Comments:  Did Not Attend  Hughie Mae 09/02/2023, 10:40 AM

## 2023-09-02 NOTE — Group Note (Signed)
 LCSW Group Therapy Note   Group Date: 09/02/2023 Start Time: 1100 End Time: 1200   Participation:  patient was present and somewhat participated in the discussion.  He listened and was respectful.  Type of Therapy:  Group Therapy  Topic:  "Healing Flames: Navigating Anger with Compassion"  Objective:  Foster self-awareness and promote compassion toward oneself and others when dealing with anger.  Goals:  Help participants understand the underlying emotions and needs fueling anger. Provide coping strategies for healthier emotional expression and anger management.  Summary: This session explored anger as a volcano--an explosion driven by deeper feelings and unmet needs. Participants learned to identify anger triggers and underlying emotions, then practiced coping strategies like deep breathing, physical activity, and journaling. The group discussed healthy ways to manage anger before it escalates, using both personal reflection and shared experiences.  Therapeutic Modalities: Cognitive Behavioral Therapy (CBT): Challenging thoughts that fuel anger. Mindfulness: Increasing awareness of emotions and sensations.   Ronald [name] O Yi Falletta, LCSWA 09/02/2023  12:52 PM

## 2023-09-02 NOTE — [facility] Group Notes (Signed)
 Spiritual care group on grief and loss facilitated by Chaplain Nick Barman, [facility]  Group Goal: Support / Education around grief and loss  Members engage in facilitated group support and psycho-social education.  Group Description:  Following introductions and group rules, group members engaged in facilitated group dialogue and support around topic of loss, with particular support around experiences of loss in their lives. Group Identified types of loss (relationships / self / things) and identified patterns, circumstances, and changes that precipitate losses. Reflected on thoughts / feelings around loss, normalized grief responses, and recognized variety in grief experience. Group encouraged individual reflection on safe space and on the coping skills that they are already utilizing.  Group drew on Adlerian / Rogerian and narrative framework  Patient Progress: Did not attnend.

## 2023-09-02 NOTE — Plan of Care (Signed)
   Problem: Education: Goal: Emotional status will improve Outcome: Progressing Goal: Mental status will improve Outcome: Progressing   Problem: Activity: Goal: Interest or engagement in activities will improve Outcome: Progressing Goal: Sleeping patterns will improve Outcome: Progressing

## 2023-09-02 NOTE — [facility] Suicide Risk Assessment (Signed)
 Suicide Risk Assessment  Discharge Assessment    [facility] Discharge Suicide Risk Assessment   Principal Problem: Schizoaffective disorder [facility]) Discharge Diagnoses: Principal Problem:   Schizoaffective disorder (HCC) Active Problems:   Substance induced mood disorder (HCC)  During the patient's hospitalization, patient had extensive initial psychiatric evaluation, and follow-up psychiatric evaluations every day.  Psychiatric diagnoses provided upon initial assessment: Schizoaffective Disorder, Bipolar Type  Patient's psychiatric medications were adjusted on admission: Increase home Depakote .  Restarted home Gabapentin    During the hospitalization, other adjustments were made to the patient's psychiatric medication regimen: His Depakote  was titrated.  Haldol  was started and titrated.  Gradually, patient started adjusting to milieu.   Patient's care was discussed during the interdisciplinary team meeting every day during the hospitalization.  The patient is not having side effects to prescribed psychiatric medication.  The patient reports their target psychiatric symptoms of mania responded well to the psychiatric medications, and the patient reports overall benefit other psychiatric hospitalization. Supportive psychotherapy was provided to the patient. The patient also participated in regular group therapy while admitted.   Labs were reviewed with the patient, and abnormal results were discussed with the patient.  The patient denied having suicidal thoughts more than 48 hours prior to discharge.  Patient denies having homicidal thoughts.  Patient denies having auditory hallucinations.  Patient denies any visual hallucinations.  Patient denies having paranoid thoughts.  The patient is able to verbalize their individual safety plan to this provider.  It is recommended to the patient to continue psychiatric medications as prescribed, after discharge from the hospital.    It is recommended  to the patient to follow up with your outpatient psychiatric provider and PCP.  Discussed with the patient, the impact of alcohol, drugs, tobacco have been there overall psychiatric and medical wellbeing, and total abstinence from substance use was recommended the patient.  Total Time spent with patient: 20 minutes  Musculoskeletal: Strength & Muscle Tone: within normal limits Gait & Station: normal Patient leans: N/A  Psychiatric Specialty Exam  Presentation  General Appearance:  Casual; Appropriate for Environment  Eye Contact: Fair  Speech: Clear and Coherent; Normal Rate  Speech Volume: Normal  Handedness: Right   Mood and Affect  Mood: -- ("ok")  Duration of Depression Symptoms: Less than two weeks  Affect: Constricted   Thought Process  Thought Processes: Linear  Descriptions of Associations:Intact  Orientation:Full (Time, Place and Person)  Thought Content:Delusions  History of Schizophrenia/Schizoaffective disorder:Yes  Duration of Psychotic Symptoms:Less than six months  Hallucinations:Hallucinations: None  Ideas of Reference:-- (some paranoia present)  Suicidal Thoughts:Suicidal Thoughts: No  Homicidal Thoughts:Homicidal Thoughts: No   Sensorium  Memory: Immediate Fair  Judgment: Poor  Insight: Poor   Executive Functions  Concentration: Good  Attention Span: Good  Recall: Good  Fund of Knowledge: Good  Language: Good   Psychomotor Activity  Psychomotor Activity: Psychomotor Activity: Normal   Assets  Assets: Communication Skills; Desire for Improvement; Resilience; Social Support   Sleep  Sleep: Sleep: Good Number of Hours of Sleep: 8   Physical Exam: Physical Exam Constitutional:      General: He is not in acute distress.    Appearance: Normal appearance. He is normal weight. He is not ill-appearing or toxic-appearing.  HENT:     Head: Normocephalic and atraumatic.  Pulmonary:     Effort:  Pulmonary effort is normal.  Musculoskeletal:        General: Normal range of motion.  Neurological:     General: No  focal deficit present.     Mental Status: He is alert.    Review of Systems  Respiratory:  Negative for cough and shortness of breath.   Cardiovascular:  Negative for chest pain.  Gastrointestinal:  Negative for abdominal pain, constipation, diarrhea, nausea and vomiting.  Neurological:  Negative for dizziness, weakness and headaches.  Psychiatric/Behavioral:  Negative for depression, hallucinations and suicidal ideas. The patient is not nervous/anxious.    Blood pressure 121/89, pulse 91, temperature 98 F (36.7 C), temperature source Oral, resp. rate 16, height 5\' 11"  (1.803 m), weight 73.5 kg, SpO2 97%. Body mass index is 22.59 kg/m.  Mental Status Per Nursing Assessment::   On Admission:  NA  Demographic Factors:  Male, Caucasian, and Unemployed  Loss Factors: NA  Historical Factors: NA  Risk Reduction Factors:   Living with another person, especially a relative and Positive social support  Continued Clinical Symptoms:  Unstable or Poor Therapeutic Relationship Previous Psychiatric Diagnoses and Treatments  Cognitive Features That Contribute To Risk:  Polarized thinking    Suicide Risk:  Minimal: No identifiable suicidal ideation.  Patients presenting with no risk factors but with morbid ruminations; may be classified as minimal risk based on the severity of the depressive symptoms   Follow-up Information     [facility]. Go on 09/09/2023.   Specialty: Behavioral Health Why: You have an appointment for medication management services on 09/09/23 at 2:00 pm. The appointment will be held in person. * Please call /confirm your appointment or it will be cancelled. Contact information: [address] [place]  [phone]        [facility] Go on 09/03/2023.   Specialty:  Professional Counselor Why: Please go to this provider on 09/03/23 at 9:00 am for an assessment, to obtain therapy services. You may also go on Monday through Friday, between 9 am to 1 pm for an assessment, during the walk in hours for new patients. Contact information: [address] [place]  [address] [name] [address] [phone]                 Plan Of Care/Follow-up recommendations:  Activity: as tolerated  Diet: heart healthy  Other: -Follow-up with your outpatient psychiatric provider -instructions on appointment date, time, and address (location) are provided to you in discharge paperwork.  -Take your psychiatric medications as prescribed at discharge - instructions are provided to you in the discharge paperwork  -Follow-up with outpatient primary care doctor and other specialists -for management of chronic medical disease, including: routine care  -Testing: Follow-up with outpatient provider for abnormal lab results: Mildly low Calcium  -Recommend abstinence from alcohol, tobacco, and other illicit drug use at discharge.   -If your psychiatric symptoms recur, worsen, or if you have side effects to your psychiatric medications, call your outpatient psychiatric provider, 911, [phone] or go to the nearest emergency department.  -If suicidal thoughts recur, call your outpatient psychiatric provider, 911, [phone] or go to the nearest emergency department.   Basilia Bosworth, DO 09/02/2023, 2:34 PM

## 2023-09-09 ENCOUNTER — Ambulatory Visit (HOSPITAL_COMMUNITY): Payer: 59 | Admitting: Psychiatry

## 2023-10-13 ENCOUNTER — Encounter: Payer: Self-pay | Admitting: Psychiatry

## 2023-10-13 ENCOUNTER — Emergency Department (HOSPITAL_COMMUNITY)
Admission: EM | Admit: 2023-10-13 | Discharge: 2023-10-13 | Disposition: A | Discharge status: Other Acute Inpatient Hospital | Attending: Emergency Medicine | Admitting: Emergency Medicine

## 2023-10-13 ENCOUNTER — Other Ambulatory Visit: Payer: Self-pay

## 2023-10-13 ENCOUNTER — Inpatient Hospital Stay
Admission: AD | Admit: 2023-10-13 | Discharge: 2023-10-22 | DRG: 885 | Disposition: A | Discharge status: Home / Self Care | Payer: 59 | Source: Intra-hospital | Attending: Psychiatry | Admitting: Psychiatry

## 2023-10-13 DIAGNOSIS — Z818 Family history of other mental and behavioral disorders: Secondary | ICD-10-CM

## 2023-10-13 DIAGNOSIS — F4310 Post-traumatic stress disorder, unspecified: Secondary | ICD-10-CM | POA: Diagnosis present

## 2023-10-13 DIAGNOSIS — Z91128 Patient's intentional underdosing of medication regimen for other reason: Secondary | ICD-10-CM

## 2023-10-13 DIAGNOSIS — F259 Schizoaffective disorder, unspecified: Principal | ICD-10-CM | POA: Diagnosis present

## 2023-10-13 DIAGNOSIS — T426X6A Underdosing of other antiepileptic and sedative-hypnotic drugs, initial encounter: Secondary | ICD-10-CM | POA: Diagnosis present

## 2023-10-13 DIAGNOSIS — R451 Restlessness and agitation: Secondary | ICD-10-CM | POA: Diagnosis not present

## 2023-10-13 DIAGNOSIS — F250 Schizoaffective disorder, bipolar type: Secondary | ICD-10-CM | POA: Diagnosis present

## 2023-10-13 DIAGNOSIS — Z765 Malingerer [conscious simulation]: Secondary | ICD-10-CM

## 2023-10-13 DIAGNOSIS — F1910 Other psychoactive substance abuse, uncomplicated: Secondary | ICD-10-CM | POA: Insufficient documentation

## 2023-10-13 DIAGNOSIS — Z888 Allergy status to other drugs, medicaments and biological substances status: Secondary | ICD-10-CM

## 2023-10-13 DIAGNOSIS — F411 Generalized anxiety disorder: Secondary | ICD-10-CM | POA: Diagnosis present

## 2023-10-13 DIAGNOSIS — F1310 Sedative, hypnotic or anxiolytic abuse, uncomplicated: Secondary | ICD-10-CM | POA: Diagnosis present

## 2023-10-13 DIAGNOSIS — F17220 Nicotine dependence, chewing tobacco, uncomplicated: Secondary | ICD-10-CM | POA: Diagnosis present

## 2023-10-13 DIAGNOSIS — F419 Anxiety disorder, unspecified: Secondary | ICD-10-CM | POA: Insufficient documentation

## 2023-10-13 DIAGNOSIS — Z79899 Other long term (current) drug therapy: Secondary | ICD-10-CM

## 2023-10-13 DIAGNOSIS — F319 Bipolar disorder, unspecified: Secondary | ICD-10-CM | POA: Diagnosis present

## 2023-10-13 LAB — CBC WITH DIFFERENTIAL/PLATELET
Abs Immature Granulocytes: 0.02 10*3/uL (ref 0.00–0.07)
Basophils Absolute: 0.1 10*3/uL (ref 0.0–0.1)
Basophils Relative: 1 %
Eosinophils Absolute: 0 10*3/uL (ref 0.0–0.5)
Eosinophils Relative: 0 %
HCT: 47.1 % (ref 39.0–52.0)
Hemoglobin: 16.7 g/dL (ref 13.0–17.0)
Immature Granulocytes: 0 %
Lymphocytes Relative: 28 %
Lymphs Abs: 2.3 10*3/uL (ref 0.7–4.0)
MCH: 30.8 pg (ref 26.0–34.0)
MCHC: 35.5 g/dL (ref 30.0–36.0)
MCV: 86.7 fL (ref 80.0–100.0)
Monocytes Absolute: 0.7 10*3/uL (ref 0.1–1.0)
Monocytes Relative: 8 %
Neutro Abs: 5.2 10*3/uL (ref 1.7–7.7)
Neutrophils Relative %: 63 %
Platelets: 321 10*3/uL (ref 150–400)
RBC: 5.43 MIL/uL (ref 4.22–5.81)
RDW: 12.4 % (ref 11.5–15.5)
WBC: 8.3 10*3/uL (ref 4.0–10.5)
nRBC: 0 % (ref 0.0–0.2)

## 2023-10-13 LAB — RAPID URINE DRUG SCREEN, HOSP PERFORMED
Amphetamines: NOT DETECTED
Barbiturates: NOT DETECTED
Benzodiazepines: NOT DETECTED
Cocaine: NOT DETECTED
Opiates: NOT DETECTED
Tetrahydrocannabinol: POSITIVE — AB

## 2023-10-13 LAB — COMPREHENSIVE METABOLIC PANEL WITH GFR
ALT: 53 U/L — ABNORMAL HIGH (ref 0–44)
AST: 41 U/L (ref 15–41)
Albumin: 4.9 g/dL (ref 3.5–5.0)
Alkaline Phosphatase: 47 U/L (ref 38–126)
Anion gap: 13 (ref 5–15)
BUN: 7 mg/dL (ref 6–20)
CO2: 24 mmol/L (ref 22–32)
Calcium: 9.5 mg/dL (ref 8.9–10.3)
Chloride: 95 mmol/L — ABNORMAL LOW (ref 98–111)
Creatinine, Ser: 1.09 mg/dL (ref 0.61–1.24)
GFR, Estimated: >60 mL/min (ref >60)
Glucose, Bld: 128 mg/dL — ABNORMAL HIGH (ref 70–99)
Potassium: 3.2 mmol/L — ABNORMAL LOW (ref 3.5–5.1)
Sodium: 132 mmol/L — ABNORMAL LOW (ref 135–145)
Total Bilirubin: 1.1 mg/dL (ref 0.0–1.2)
Total Protein: 8 g/dL (ref 6.5–8.1)

## 2023-10-13 LAB — ETHANOL: Alcohol, Ethyl (B): <15 mg/dL (ref <15)

## 2023-10-13 MED ORDER — POTASSIUM CHLORIDE CRYS ER 20 MEQ PO TBCR
40.0000 meq | EXTENDED_RELEASE_TABLET | Freq: Once | ORAL | Status: AC
  Administered 2023-10-13: 40 meq via ORAL
  Filled 2023-10-13: qty 2

## 2023-10-13 MED ORDER — GABAPENTIN 100 MG PO CAPS: 100.0000 mg | ORAL_CAPSULE | Freq: Three times a day (TID) | ORAL | Status: DC

## 2023-10-13 MED ORDER — HALOPERIDOL 5 MG PO TABS: 5.0000 mg | ORAL_TABLET | Freq: Three times a day (TID) | ORAL | Status: DC | PRN

## 2023-10-13 MED ORDER — MAGNESIUM HYDROXIDE 400 MG/5ML PO SUSP: 30.0000 mL | Freq: Every day | ORAL | Status: DC | PRN

## 2023-10-13 MED ORDER — GABAPENTIN 100 MG PO CAPS
100.0000 mg | ORAL_CAPSULE | Freq: Three times a day (TID) | ORAL | Status: DC
  Administered 2023-10-13 – 2023-10-16 (×9): 100 mg via ORAL
  Filled 2023-10-13 (×10): qty 1

## 2023-10-13 MED ORDER — GABAPENTIN 100 MG PO CAPS
100.0000 mg | ORAL_CAPSULE | Freq: Once | ORAL | Status: AC
  Administered 2023-10-13: 100 mg via ORAL
  Filled 2023-10-13: qty 1

## 2023-10-13 MED ORDER — HALOPERIDOL 5 MG PO TABS
10.0000 mg | ORAL_TABLET | Freq: Every day | ORAL | Status: DC
  Administered 2023-10-13: 10 mg via ORAL
  Filled 2023-10-13: qty 2

## 2023-10-13 MED ORDER — LORAZEPAM 2 MG/ML IJ SOLN: 2.0000 mg | Freq: Three times a day (TID) | INTRAMUSCULAR | Status: DC | PRN

## 2023-10-13 MED ORDER — HALOPERIDOL LACTATE 5 MG/ML IJ SOLN: 5.0000 mg | Freq: Three times a day (TID) | INTRAMUSCULAR | Status: DC | PRN

## 2023-10-13 MED ORDER — ALUM & MAG HYDROXIDE-SIMETH 200-200-20 MG/5ML PO SUSP: 30.0000 mL | ORAL | Status: DC | PRN

## 2023-10-13 MED ORDER — DIVALPROEX SODIUM ER 500 MG PO TB24: 500.0000 mg | ORAL_TABLET | Freq: Two times a day (BID) | ORAL | Status: DC

## 2023-10-13 MED ORDER — PROPRANOLOL HCL 20 MG PO TABS
10.0000 mg | ORAL_TABLET | Freq: Two times a day (BID) | ORAL | Status: DC | PRN
  Administered 2023-10-18: 10 mg via ORAL
  Filled 2023-10-13: qty 1

## 2023-10-13 MED ORDER — ACETAMINOPHEN 325 MG PO TABS
650.0000 mg | ORAL_TABLET | Freq: Four times a day (QID) | ORAL | Status: DC | PRN
  Administered 2023-10-19 – 2023-10-21 (×3): 650 mg via ORAL
  Filled 2023-10-13 (×3): qty 2

## 2023-10-13 MED ORDER — NICOTINE POLACRILEX 2 MG MT GUM
2.0000 mg | CHEWING_GUM | OROMUCOSAL | Status: DC | PRN
  Administered 2023-10-14 – 2023-10-22 (×25): 2 mg via ORAL
  Filled 2023-10-13 (×20): qty 1

## 2023-10-13 MED ORDER — DIVALPROEX SODIUM ER 500 MG PO TB24
500.0000 mg | ORAL_TABLET | Freq: Two times a day (BID) | ORAL | Status: DC
  Administered 2023-10-13: 500 mg via ORAL
  Filled 2023-10-13: qty 1

## 2023-10-13 MED ORDER — NICOTINE 14 MG/24HR TD PT24
14.0000 mg | MEDICATED_PATCH | Freq: Every day | TRANSDERMAL | Status: DC
  Administered 2023-10-13 – 2023-10-22 (×9): 14 mg via TRANSDERMAL
  Filled 2023-10-13 (×9): qty 1

## 2023-10-13 MED ORDER — LORAZEPAM 2 MG/ML IJ SOLN
2.0000 mg | Freq: Once | INTRAMUSCULAR | Status: AC
  Administered 2023-10-13: 2 mg via INTRAMUSCULAR
  Filled 2023-10-13: qty 1

## 2023-10-13 MED ORDER — HALOPERIDOL LACTATE 5 MG/ML IJ SOLN: 10.0000 mg | Freq: Three times a day (TID) | INTRAMUSCULAR | Status: DC | PRN

## 2023-10-13 MED ORDER — HALOPERIDOL 5 MG PO TABS: 10.0000 mg | ORAL_TABLET | Freq: Every day | ORAL | Status: DC

## 2023-10-13 MED ORDER — HALOPERIDOL 5 MG PO TABS
10.0000 mg | ORAL_TABLET | Freq: Once | ORAL | Status: AC
  Administered 2023-10-13: 10 mg via ORAL
  Filled 2023-10-13: qty 2

## 2023-10-13 MED ORDER — TRAZODONE HCL 50 MG PO TABS
50.0000 mg | ORAL_TABLET | Freq: Every evening | ORAL | Status: DC | PRN
  Administered 2023-10-13 – 2023-10-21 (×9): 50 mg via ORAL
  Filled 2023-10-13 (×9): qty 1

## 2023-10-13 MED ORDER — HALOPERIDOL LACTATE 5 MG/ML IJ SOLN
10.0000 mg | Freq: Three times a day (TID) | INTRAMUSCULAR | Status: DC | PRN
  Administered 2023-10-14 – 2023-10-19 (×3): 10 mg via INTRAMUSCULAR
  Filled 2023-10-13 (×3): qty 2
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
## 2023-10-13 NOTE — Progress Notes (Signed)
 Patient has been accepted to [id number] on 10/13/2023 Accepting is Elveria Batter NP Dr. Donnelly MD. Is attending.

## 2023-10-13 NOTE — Tx Team (Signed)
 Initial Treatment Plan 10/13/2023 4:22 PM KHAMBREL AMSDEN FMW:[id number]    PATIENT STRESSORS: Other: thinking about family that has passed away     PATIENT STRENGTHS: Ability for insight  Supportive family/friends    PATIENT IDENTIFIED PROBLEMS: Anxiety                      DISCHARGE CRITERIA:  Improved stabilization in mood, thinking, and/or behavior  PRELIMINARY DISCHARGE PLAN: Return to previous work or school arrangements  PATIENT/FAMILY INVOLVEMENT: This treatment plan has been presented to and reviewed with the patient, NAZEER ROMNEY, The patient and family have been given the opportunity to ask questions and make suggestions.  Florella Mcneese B Naiya Corral, RN 10/13/2023, 4:22 PM

## 2023-10-13 NOTE — Group Note (Signed)
 Date:  10/13/2023 Time:  8:25 PM  Group Topic/Focus:  Wellness Toolbox:   The focus of this group is to discuss various aspects of wellness, balancing those aspects and exploring ways to increase the ability to experience wellness.  Patients will create a wellness toolbox for use upon discharge.    Participation Level:  Did Not Attend  Participation Quality:  none  Affect:  none  Cognitive:  none  Insight: None  Engagement in Group:  none  Modes of Intervention:  none  Additional Comments:  none   Kerri Katz 10/13/2023, 8:25 PM

## 2023-10-13 NOTE — ED Provider Notes (Signed)
 Ronald [name] EMERGENCY DEPARTMENT AT [facility] Provider Note   CSN: [id number] Arrival date & time: 10/13/23  9193     Patient presents with: Anxiety   Ronald [name] is a 43 y.o. male with a history of bipolar 1 disorder, PTSD, substance abuse  disorder who presents to the ED via EMS for acute anxiety.  Patient yelling in the hallway that he has anxiety and needs medication.  On evaluation, patient reports that he has been out of his medications for the past 2 weeks.  States the last time he was at [facility] he was switched from Klonopin  to Haldol , which he thinks is making his symptoms worse.  States that he needs to go back to [facility] to get his medications sorted out.  Denies any SI, HI, or hallucinations at this time.    Prior to Admission medications   Medication Sig Start Date End Date Taking? Authorizing Provider  divalproex  (DEPAKOTE  ER) 500 MG 24 hr tablet Take 2 tablets (1,000 mg total) by mouth 2 times daily at 12 noon and 4 pm. 09/02/23  Yes Pashayan, Marsa RAMAN, DO  gabapentin  (NEURONTIN ) 100 MG capsule Take 2 capsules (200 mg total) by mouth 3 (three) times daily. 06/13/23  Yes Harl Regan E, NP  haloperidol  (HALDOL ) 10 MG tablet Take 3 tablets (30 mg total) by mouth at bedtime. 09/02/23  Yes Pashayan, Marsa RAMAN, DO  MAGNESIUM  OXIDE, ELEMENTAL, PO Take 500 mg by mouth at bedtime.   Yes [provider]  Multiple Vitamin (MULTIVITAMIN WITH MINERALS) TABS tablet Take 1 tablet by mouth daily with breakfast. 05/28/23  Yes Massengill, Rankin, MD  nicotine  (NICODERM CQ  - DOSED IN MG/24 HOURS) 14 mg/24hr patch Place 1 patch (14 mg total) onto the skin daily. 09/03/23  Yes Pashayan, Marsa RAMAN, DO  traZODone  (DESYREL ) 100 MG tablet Take 1 tablet (100 mg total) by mouth at bedtime. 09/02/23  Yes Pashayan, Marsa RAMAN, DO    Allergies: Hydroxyzine , Alupent [metaproterenol], Benadryl [diphenhydramine], Klonopin  [clonazepam ], Other, Quetiapine , and Zyprexa  [olanzapine ]     Review of Systems  Psychiatric/Behavioral:  Positive for agitation and behavioral problems. The patient is nervous/anxious.   All other systems reviewed and are negative.   Updated Vital Signs BP (!) 148/98   Pulse 88   Temp 100 F (37.8 C) (Oral)   Resp 18   SpO2 100%   Physical Exam Vitals and nursing note reviewed.  Constitutional:      Appearance: Normal appearance.  HENT:     Head: Normocephalic and atraumatic.     Mouth/Throat:     Mouth: Mucous membranes are moist.   Eyes:     Conjunctiva/sclera: Conjunctivae normal.     Pupils: Pupils are equal, round, and reactive to light.    Cardiovascular:     Rate and Rhythm: Normal rate and regular rhythm.     Pulses: Normal pulses.     Heart sounds: Normal heart sounds.  Pulmonary:     Effort: Pulmonary effort is normal.     Breath sounds: Normal breath sounds.   Musculoskeletal:        General: Normal range of motion.     Cervical back: Normal range of motion.   Skin:    General: Skin is warm and dry.     Findings: No rash.   Neurological:     General: No focal deficit present.     Mental Status: He is alert.   Psychiatric:     Comments: Patient goes from  agitated to anxious to crying on evaluation    (all labs ordered are listed, but only abnormal results are displayed) Labs Reviewed  COMPREHENSIVE METABOLIC PANEL WITH GFR - Abnormal; Notable for the following components:      Result Value   Sodium 132 (*)    Potassium 3.2 (*)    Chloride 95 (*)    Glucose, Bld 128 (*)    ALT 53 (*)    All other components within normal limits  RAPID URINE DRUG SCREEN, HOSP PERFORMED - Abnormal; Notable for the following components:   Tetrahydrocannabinol POSITIVE (*)    All other components within normal limits  ETHANOL  CBC WITH DIFFERENTIAL/PLATELET    EKG: EKG Interpretation Date/Time:  Monday October 13 2023 08:53:03 EDT Ventricular Rate:  97 PR Interval:  168 QRS Duration:  74 QT Interval:  326 QTC  Calculation: 415 R Axis:   79  Text Interpretation: Sinus rhythm ST elevation consistent with early repolarization similar to prior EKG Confirmed by Lenor Hollering [phone]) on 10/13/2023 8:56:01 AM  Radiology: No results found.   Procedures   Medications Ordered in the ED  haloperidol  (HALDOL ) tablet 10 mg (has no administration in time range)  gabapentin  (NEURONTIN ) capsule 100 mg (has no administration in time range)  divalproex  (DEPAKOTE  ER) 24 hr tablet 500 mg (has no administration in time range)  haloperidol  (HALDOL ) tablet 10 mg (has no administration in time range)  gabapentin  (NEURONTIN ) capsule 100 mg (has no administration in time range)  LORazepam  (ATIVAN ) injection 2 mg (2 mg Intramuscular Given 10/13/23 0832)  potassium chloride  SA (KLOR-CON  M) CR tablet 40 mEq (40 mEq Oral Given 10/13/23 0950)                                    Medical Decision Making Amount and/or Complexity of Data Reviewed Labs: ordered.  Risk Prescription drug management.   This patient presents to the ED for concern of anxiety, this involves an extensive number of treatment options, and is a complaint that carries with it a high risk of complications and morbidity.   Differential diagnosis includes: acute anxiety, mania, agitation, SI, HI, etc.   Comorbidities  See HPI above   Additional History  Additional history obtained from prior records   Cardiac Monitoring / EKG  The patient was maintained on a cardiac monitor.  I personally viewed and interpreted the cardiac monitored which showed: sinus rhythm with a heart rate of 97 bpm.   Lab Tests  I ordered and personally interpreted labs.  The pertinent results include:   CBC is unremarkable Potassium of 3.2 otherwise CMP is reassuring Ethanol <15 UDS positive for tetrahydrocannabinol   Problem List / ED Course / Critical Interventions / Medication Management  Patient reports being out of his medication for the past 2 weeks.   Prior to my evaluation he was yelling in the hallway stating he needed medication for his anxiety.  At the time of my evaluation he was crying.  I ordered medications including: Ativan  for acute anxiety/agitation  Potassium given for hypokalemia Reevaluation of the patient after these medicines showed that the patient improved I have reviewed the patients home medicines and have made adjustments as needed   Social Determinants of Health  Substance use   Test / Admission - Considered  Patient is cleared from the ED standpoint.  TTS consult pending.    Final diagnoses:  Anxiety  ED Discharge Orders     None          Waddell Sluder, PA-C 10/13/23 1240    Lenor Hollering, MD 10/13/23 1249

## 2023-10-13 NOTE — Group Note (Signed)
 Recreation Therapy Group Note   Group Topic:Relaxation  Group Date: 10/13/2023 Start Time: 1530 End Time: 1610 Facilitators: Celestia Jeoffrey BRAVO, LRT, CTRS Location: Craft Room  Group Description: PMR (Progressive Muscle Relaxation). LRT educates patients on what PMR is and the benefits that come from it. Patients are asked to sit with their feet flat on the floor while sitting up and all the way back in their chair, if possible. LRT and pts follow a prompt through a speaker that requires you to tense and release different muscles in their body and focus on their breathing. During session, lights are off and soft music is being played.   Goal Area(s) Addressed:  Patients will be able to describe progressive muscle relaxation.  Patient will practice using relaxation technique. Patient will identify a new coping skill.  Patient will follow multistep directions to reduce anxiety and stress.   Affect/Mood: N/A   Participation Level: Did not attend    Clinical Observations/Individualized Feedback: Patient did not attend group.   Plan: Continue to engage patient in RT group sessions 2-3x/week.   Jeoffrey BRAVO Celestia, LRT, CTRS 10/13/2023 5:16 PM

## 2023-10-13 NOTE — Plan of Care (Signed)
   Problem: Education: Goal: Knowledge of [facility] Education information/materials will improve Outcome: Not Progressing Goal: Emotional status will improve Outcome: Not Progressing Goal: Mental status will improve Outcome: Not Progressing Goal: Verbalization of understanding the information provided will improve Outcome: Not Progressing

## 2023-10-13 NOTE — ED Notes (Signed)
 Patient off unit to facility per provider. Patient alert, calm, cooperative, no s/s of distress at time of discharge. Discharge information and belongings given to [employer]. Patient ambulatory off unit, escorted by NT. Patient transported by [employer].

## 2023-10-13 NOTE — Progress Notes (Signed)
 Pt arrived from ED. States his anxiety 8/10 and panic attacks is what brought him to the hospital. States anxiety was triggered by thinking about his relatives that have passed away. Pt denies SI/HI/AVH. States is goals are to attend group, get his medications correct and work on coping skills while he is here. Pt oriented to unit and all questions answered.     10/13/23 1607  Psych Admission Type (Psych Patients Only)  Admission Status Voluntary  Psychosocial Assessment  Patient Complaints Anxiety  Eye Contact Avoids  Facial Expression Anxious  Affect Apathetic  Speech Logical/coherent  Interaction Minimal  Motor Activity Rigidity  Appearance/Hygiene In scrubs;Body odor  Behavior Characteristics Cooperative  Mood Pleasant  Thought Process  Coherency WDL  Content WDL  Delusions None reported or observed  Perception WDL  Hallucination None reported or observed  Judgment WDL  Confusion None  Danger to Self  Current suicidal ideation? Denies

## 2023-10-13 NOTE — ED Triage Notes (Addendum)
 Pt BIBA from home, been out of depakote  for 2 weeks. Very anxious. Denies pain. States he needs to go to [facility] and get regulated and get his haldol  shot also.   172/90 Hr 123 98% RA

## 2023-10-13 NOTE — ED Notes (Signed)
 Pt come to desk and started yelling due to what he felt was a delay in being seen by EDP. Garrel RN was able to verbally de-escalate the event. Pt did return to room, security come to stand by while PA was in room speaking with pt.

## 2023-10-13 NOTE — ED Notes (Signed)
 Pt belongings placed into locker 35

## 2023-10-14 DIAGNOSIS — F250 Schizoaffective disorder, bipolar type: Secondary | ICD-10-CM

## 2023-10-14 MED ORDER — BENZTROPINE MESYLATE 1 MG PO TABS
1.0000 mg | ORAL_TABLET | Freq: Two times a day (BID) | ORAL | Status: DC | PRN
Start: 2023-10-14 — End: 2023-10-22
  Administered 2023-10-18: 1 mg via ORAL
  Filled 2023-10-14: qty 1

## 2023-10-14 MED ORDER — DIVALPROEX SODIUM 500 MG PO DR TAB
500.0000 mg | DELAYED_RELEASE_TABLET | Freq: Two times a day (BID) | ORAL | Status: DC
  Administered 2023-10-14 – 2023-10-18 (×8): 500 mg via ORAL
  Filled 2023-10-14 (×8): qty 1

## 2023-10-14 MED ORDER — BENZTROPINE MESYLATE 1 MG/ML IJ SOLN
1.0000 mg | Freq: Two times a day (BID) | INTRAMUSCULAR | Status: DC | PRN
Start: 2023-10-14 — End: 2023-10-22
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
## 2023-10-14 NOTE — Group Note (Signed)
 Date:  10/14/2023 Time:  8:50 PM  Group Topic/Focus:  Wrap-Up Group:   The focus of this group is to help patients review their daily goal of treatment and discuss progress on daily workbooks. Patients meditated and discussed positive things happening in life currently.    Participation Level:  Active  Participation Quality:  Appropriate  Affect:  Appropriate  Cognitive:  Appropriate  Insight: Appropriate  Engagement in Group:  Engaged  Modes of Intervention:  Activity  Additional Comments:    Leigh VEAR Pais 10/14/2023, 8:50 PM

## 2023-10-14 NOTE — [facility] Suicide Risk Assessment (Signed)
 [facility] INPATIENT:  Family/Significant Other Suicide Prevention Education  Suicide Prevention Education:  Education Completed; [name], mother, [phone],  (name of family member/significant other) has been identified by the patient as the family member/significant other with whom the patient will be residing, and identified as the person(s) who will aid the patient in the event of a mental health crisis (suicidal ideations/suicide attempt).  With written consent from the patient, the family member/significant other has been provided the following suicide prevention education, prior to the and/or following the discharge of the patient.  The suicide prevention education provided includes the following: Suicide risk factors Suicide prevention and interventions National Suicide Hotline telephone number [facility] assessment telephone number [facility] Emergency Assistance 911 [facility] and/or Residential Mobile Crisis Unit telephone number  Request made of family/significant other to: Remove weapons (e.g., guns, rifles, knives), all items previously/currently identified as safety concern.   Remove drugs/medications (over-the-counter, prescriptions, illicit drugs), all items previously/currently identified as a safety concern.  The family member/significant other verbalizes understanding of the suicide prevention education information provided.  The family member/significant other agrees to remove the items of safety concern listed above.  Sherryle JINNY Margo 10/14/2023, 1:43 PM

## 2023-10-14 NOTE — Group Note (Signed)
 LCSW Group Therapy Note   Group Date: 10/14/2023 Start Time: 1300 End Time: 1400   Type of Therapy and Topic:  Group Therapy: Challenging Core Beliefs  Participation Level:  Active  Description of Group:  Patients were educated about core beliefs and asked to identify one harmful core belief that they have. Patients were asked to explore from where those beliefs originate. Patients were asked to discuss how those beliefs make them feel and the resulting behaviors of those beliefs. They were then be asked if those beliefs are true and, if so, what evidence they have to support them. Lastly, group members were challenged to replace those negative core beliefs with helpful beliefs.   Therapeutic Goals:   1. Patient will identify harmful core beliefs and explore the origins of such beliefs. 2. Patient will identify feelings and behaviors that result from those core beliefs. 3. Patient will discuss whether such beliefs are true. 4.  Patient will replace harmful core beliefs with helpful ones.  Summary of Patient Progress:  Patient actively engaged in processing and exploring how core beliefs are formed and how they impact thoughts, feelings, and behaviors. Patient proved open to input from peers and feedback from CSW. Patient demonstrated proficient insight into the subject matter, was respectful and supportive of peers, and participated throughout the entire session.  Therapeutic Modalities: Cognitive Behavioral Therapy; Solution-Focused Therapy   Ronald [name] M Ronald [name], LCSWA 10/14/2023  2:45 PM
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
## 2023-10-14 NOTE — Plan of Care (Signed)
  Problem: Education: Goal: Knowledge of [facility] Education information/materials will improve Outcome: Progressing Goal: Mental status will improve Outcome: Progressing   Problem: Activity: Goal: Interest or engagement in activities will improve Outcome: Progressing   Problem: Coping: Goal: Ability to verbalize frustrations and anger appropriately will improve Outcome: Progressing
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
## 2023-10-14 NOTE — Group Note (Signed)
 Date:  10/14/2023 Time:  10:54 AM  Group Topic/Focus:  Making Healthy Choices:   The focus of this group is to help patients identify negative/unhealthy choices they were using prior to admission and identify positive/healthier coping strategies to replace them upon discharge.    Participation Level:  Did Not Attend   Deitra Clap [facility] 10/14/2023, 10:54 AM

## 2023-10-14 NOTE — Group Note (Signed)
 Recreation Therapy Group Note   Group Topic:Stress Management  Group Date: 10/14/2023 Start Time: 1530 End Time: 1620 Facilitators: Celestia Jeoffrey FORBES ARTICE, CTRS Location: Craft Room  Group Description: Taboo. LRT and patients played the game Taboo. The object of the game is to have peers guess the word up at the top of the card drawn that is in bold, while being sure to not use any of the descriptive words down below it on the same card. If the person attempting to explain the word in bold uses any of the descriptive words down below, they lose their turn, and no one receives that card or point. LRT and patient's took turns being the one to describe the words while the rest of the group tried to guess what they were describing.   Goal Area(s) Addressed: Patient will identify physical symptoms of stress. Patient will identify emotional symptoms of stress. Patient will identify coping skills for stress. Patient will build frustration tolerance skills.  Patient will increase communication.   Affect/Mood: Blunted and Flat   Participation Level: Active and Engaged   Participation Quality: Independent   Behavior: Calm and Cooperative   Speech/Thought Process: Coherent   Insight: Fair   Judgement: Good   Modes of Intervention: Cooperative Play and Group work   Patient Response to Interventions:  Attentive and Receptive   Education Outcome:  Acknowledges education   Clinical Observations/Individualized Feedback: Pedram was active in their participation of session activities and group discussion. Pt interacted well with LRT and peers duration of session.    Plan: Continue to engage patient in RT group sessions 2-3x/week.   Jeoffrey FORBES Celestia, LRT, CTRS 10/14/2023 5:33 PM

## 2023-10-14 NOTE — Plan of Care (Signed)
   Problem: Education: Goal: Emotional status will improve Outcome: Not Progressing Goal: Mental status will improve Outcome: Not Progressing
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
## 2023-10-15 DIAGNOSIS — F250 Schizoaffective disorder, bipolar type: Secondary | ICD-10-CM | POA: Diagnosis not present

## 2023-10-15 LAB — VALPROIC ACID LEVEL: Valproic Acid Lvl: 53 ug/mL (ref 50–100)

## 2023-10-15 NOTE — Progress Notes (Signed)
 Patient came to medication room, shaking , stating he was having a panic attack. Then began to request ativan  and IV haldol  instead of PO haldol  that was ordered. Began asking if there were different types of medications ordered for him. Shaking stopped and patient went to bed.SABRA

## 2023-10-15 NOTE — Group Note (Signed)
 [facility] LCSW Group Therapy Note   Group Date: 10/15/2023 Start Time: 1300 End Time: 1330   Type of Therapy/Topic:  Group Therapy:  Emotion Regulation  Participation Level:  Active    Description of Group:    The purpose of this group is to assist patients in learning to regulate negative emotions and experience positive emotions. Patients will be guided to discuss ways in which they have been vulnerable to their negative emotions. These vulnerabilities will be juxtaposed with experiences of positive emotions or situations, and patients challenged to use positive emotions to combat negative ones. Special emphasis will be placed on coping with negative emotions in conflict situations, and patients will process healthy conflict resolution skills.  Therapeutic Goals: Patient will identify two positive emotions or experiences to reflect on in order to balance out negative emotions:  Patient will label two or more emotions that they find the most difficult to experience:  Patient will be able to demonstrate positive conflict resolution skills through discussion or role plays:   Summary of Patient Progress: Patient was present for the majority of the group process. He was vocal in the group but off topic and difficult to redirect. Pt spoke over other pts and was only interested in speaking about what he wanted to speak about. Insight into topic remains questionable.    Therapeutic Modalities:   Cognitive Behavioral Therapy Feelings Identification Dialectical Behavioral Therapy   Nadara JONELLE Fam, LCSW

## 2023-10-15 NOTE — Progress Notes (Signed)
   10/15/23 0900  Psych Admission Type (Psych Patients Only)  Admission Status Voluntary  Psychosocial Assessment  Patient Complaints Anxiety  Eye Contact Brief  Facial Expression Anxious  Affect Apathetic  Speech Logical/coherent  Interaction Minimal  Motor Activity Slow  Appearance/Hygiene Body odor  Behavior Characteristics Cooperative  Mood Pleasant  Thought Process  Coherency WDL  Content WDL  Delusions None reported or observed  Perception WDL  Hallucination None reported or observed  Judgment Poor  Confusion None  Danger to Self  Current suicidal ideation? Denies  Agreement Not to Harm Self Yes  Description of Agreement verbal  Danger to Others  Danger to Others None reported or observed

## 2023-10-15 NOTE — Group Note (Signed)
 Date:  10/15/2023 Time:  3:27 PM  Group Topic/Focus:  Wellness Toolbox:   The focus of this group is to discuss various aspects of wellness, balancing those aspects and exploring ways to increase the ability to experience wellness.  Patients will create a wellness toolbox for use upon discharge.    Participation Level:  Did Not Attend    Ronald [name] 10/15/2023, 3:27 PM

## 2023-10-15 NOTE — Plan of Care (Signed)
  Problem: Education: Goal: Emotional status will improve Outcome: Progressing Goal: Mental status will improve Outcome: Progressing Goal: Verbalization of understanding the information provided will improve Outcome: Progressing   Problem: Activity: Goal: Interest or engagement in activities will improve Outcome: Progressing   Problem: Coping: Goal: Ability to verbalize frustrations and anger appropriately will improve Outcome: Progressing

## 2023-10-15 NOTE — Progress Notes (Signed)
 Pt pleasant during assessment denying SI/HI/AVH. Pt observed by this Clinical research associate interacting appropriately with staff and peers on the unit. Pt requested to get his Haldol  by IM and not PO. Pt compliant with medication administration per MD orders. Pt given education, support, and encouragement to be active in his treatment plan. Pt being monitored Q 15 minutes for safety per unit protocol, remains safe on the unit

## 2023-10-15 NOTE — Progress Notes (Signed)
 [facility] MD Progress Note  10/15/2023 10:16 PM Ronald [name]  MRN:  [id number] Patient is a 43 year old male who presented to the ED on 6/30 due to concerns about acute anxiety.  He has a psychiatric history of schizoaffective disorder bipolar type, GAD, PTSD, cannabis use disorder, and benzodiazepine use disorder.  There is no significant medical history reported.  Patient was recently discharged from [facility] on 09/02/2023.  He reports he did not follow-up following being discharged from the hospital and that he has been partially compliant with his medications reporting that he has been taking the Depakote    Subjective:  Chart reviewed, case discussed in multidisciplinary meeting, patient seen during rounds.  On interview patient is noted to be resting in bed.  He remains fixated on needing Ativan  to help with the anxiety.  When asked about the reason for his visit he talks about watching TV and thinking about grandparents and having an anxiety attack which prompted his ED visit.  He reports Klonopin  was the only medication that helped him and he does not require any Haldol  or any other medications.  He talks about his patient rides stating that he will not be taking any other medications after discharge and he will take dose only during the hospitalization.  Dr. Kenn was on the phone through [employer] as a second physician to approve enforced medication.  Patient reports that oral medications do not do well for him and he prefers IM injections.  Provider and second physician Dr. Srivastava tried to educate him on LAI which is an injectable form that he gets once in a month but patient exhibits concrete thought process and states he is going to stop all his medications on discharge and he is not going to take any LAI's.  He is not endorsing SI/HI and not responding to internal stimuli at this time.   Sleep: Fair  Appetite:  Fair  Past Psychiatric History: see h&P Family History: History reviewed.  No pertinent family history. Social History:  Social History   Substance and Sexual Activity  Alcohol Use No     Social History   Substance and Sexual Activity  Drug Use Yes   Types: Marijuana   Comment: one year ago    Social History   Socioeconomic History   Marital status: Single    Spouse name: Not on file   Number of children: Not on file   Years of education: Not on file   Highest education level: Not on file  Occupational History   Not on file  Tobacco Use   Smoking status: Never   Smokeless tobacco: Current    Types: Chew  Vaping Use   Vaping status: Never Used  Substance and Sexual Activity   Alcohol use: No   Drug use: Yes    Types: Marijuana    Comment: one year ago   Sexual activity: Not Currently  Other Topics Concern   Not on file  Social History Narrative   Not on file   Social Drivers of Health   Financial Resource Strain: Not on file  Food Insecurity: No Food Insecurity (10/13/2023)   Hunger Vital Sign    Worried About Running Out of Food in the Last Year: Never true    Ran Out of Food in the Last Year: Never true  Transportation Needs: No Transportation Needs (10/13/2023)   PRAPARE - Administrator, Civil Service (Medical): No    Lack of Transportation (Non-Medical): No  Physical Activity:  Not on file  Stress: Not on file  Social Connections: Not on file   Past Medical History:  Past Medical History:  Diagnosis Date   Anxiety    Arthritis    Disorder of pineal gland    Post traumatic stress disorder    Schizoaffective disorder, bipolar type (HCC) 08/29/2023   Severe manic bipolar 1 disorder with psychotic behavior (HCC) 08/29/2023    Past Surgical History:  Procedure Laterality Date   ANKLE SURGERY      Current Medications: Current Facility-Administered Medications  Medication Dose Route Frequency Provider Last Rate Last Admin   acetaminophen  (TYLENOL ) tablet 650 mg  650 mg Oral Q6H PRN Coleman, Carolyn H, NP        alum & mag hydroxide-simeth (MAALOX/MYLANTA) 200-200-20 MG/5ML suspension 30 mL  30 mL Oral Q4H PRN Mardy Elveria DEL, NP       benztropine (COGENTIN) tablet 1 mg  1 mg Oral BID PRN Millington, Matthew E, PA-C       Or   benztropine mesylate (COGENTIN) injection 1 mg  1 mg Intramuscular BID PRN Millington, Matthew E, PA-C       divalproex  (DEPAKOTE ) DR tablet 500 mg  500 mg Oral BID Millington, Matthew E, PA-C   500 mg at 10/15/23 2049   gabapentin  (NEURONTIN ) capsule 100 mg  100 mg Oral TID Mardy Elveria DEL, NP   100 mg at 10/15/23 1653   haloperidol  (HALDOL ) tablet 10 mg  10 mg Oral QHS Mardy Elveria DEL, NP   10 mg at 10/13/23 2126   haloperidol  (HALDOL ) tablet 5 mg  5 mg Oral TID PRN Mardy Elveria DEL, NP       haloperidol  lactate (HALDOL ) injection 10 mg  10 mg Intramuscular TID PRN Mardy Elveria DEL, NP   10 mg at 10/15/23 2050   magnesium  hydroxide (MILK OF MAGNESIA) suspension 30 mL  30 mL Oral Daily PRN Mardy Elveria DEL, NP       nicotine  (NICODERM CQ  - dosed in mg/24 hours) patch 14 mg  14 mg Transdermal Daily Josemanuel Eakins, MD   14 mg at 10/15/23 9145   nicotine  polacrilex (NICORETTE ) gum 2 mg  2 mg Oral PRN Artesia Berkey, MD   2 mg at 10/14/23 1243   propranolol (INDERAL) tablet 10 mg  10 mg Oral BID PRN Millington, Matthew E, PA-C       traZODone  (DESYREL ) tablet 50 mg  50 mg Oral QHS PRN Mardy Elveria DEL, NP   50 mg at 10/15/23 2050    Lab Results:  Results for orders placed or performed during the hospital encounter of 10/13/23 (from the past 48 hours)  Valproic acid  level     Status: None   Collection Time: 10/15/23  6:40 AM  Result Value Ref Range   Valproic Acid  Lvl 53 50 - 100 ug/mL    Comment: Performed at  [facility], [address] [place], [name] [address]    Blood Alcohol level:  Lab Results  Component Value Date   [facility] <15 10/13/2023   [facility] <15 08/15/2023    Metabolic Disorder Labs: Lab Results  Component Value Date   HGBA1C 4.9  05/17/2023   MPG 93.93 05/17/2023   MPG 93.93 09/29/2021   No results found for: PROLACTIN Lab Results  Component Value Date   CHOL 152 05/17/2023   TRIG 70 05/17/2023   HDL 78 05/17/2023   CHOLHDL 1.9 05/17/2023   VLDL 14 05/17/2023   LDLCALC 60 05/17/2023  LDLCALC 55 02/19/2020    Physical Findings: AIMS:  , ,  ,  ,    CIWA:    COWS:      Psychiatric Specialty Exam:  Presentation  General Appearance:  Casual  Eye Contact: Minimal  Speech: Normal Rate  Speech Volume: Increased    Mood and Affect  Mood: Irritable; Labile  Affect: Congruent   Thought Process  Thought Processes: Disorganized  Descriptions of Associations:Intact  Orientation:Full (Time, Place and Person)  Thought Content:Perseveration; Rumination; Delusions  Hallucinations:Hallucinations: None  Ideas of Reference:Delusions; Paranoia  Suicidal Thoughts:Suicidal Thoughts: No  Homicidal Thoughts:Homicidal Thoughts: No   Sensorium  Memory: Immediate Fair  Judgment: Poor  Insight: Poor   Executive Functions  Concentration: Fair  Attention Span: Good  Recall: Good  Fund of Knowledge: Good  Language: Good   Psychomotor Activity  Psychomotor Activity: Psychomotor Activity: Restlessness  Musculoskeletal: Strength & Muscle Tone: within normal limits Gait & Station: normal Assets  Assets: Communication Skills    Physical Exam: Physical Exam Vitals and nursing note reviewed.    ROS Blood pressure 101/67, pulse 62, temperature 98.5 F (36.9 C), resp. rate 20, height 5' 11 (1.803 m), weight 77.1 kg, SpO2 100%. Body mass index is 23.71 kg/m.  Diagnosis: Principal Problem:   Schizoaffective disorder [facility])    Clinical Decision Making:   Treatment Plan Summary: 43 year old male with schizoaffective disorder bipolar type, generalized anxiety disorder, post-traumatic stress disorder, cannabis use disorder, and benzodiazepine use disorder, recently  discharged from inpatient care on 5?/?20?/?25, now presenting with acute anxiety after viewing a television program that triggered traumatic memories of his brother's murder and his grandfather's death, exhibiting partial Depakote  adherence, no other confirmed pharmacologic compliance, poor outpatient follow-up, and severely impaired insight and judgment. 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 They denied current suicidal or homicidal ideation, hallucinations, or overt mania; no response to internal stimuli observed. He repeatedly requested Ativan  and displayed drug-seeking behavior consistent with his diagnosed benzodiazepine use disorder, emphasizing the need for careful controlled-substance management. Allergies to olanzapine  and quetiapine  are charted but unsubstantiated. Given his agitation, poor insight, medication non-adherence, multiple recent hospitalizations, and active benzodiazepine-seeking, he meets criteria for continued inpatient psychiatric hospitalization for further diagnostic clarification, medication optimization, substance-use management, and stabilization.   Safety and Monitoring:             -- Admission to inpatient psychiatric unit for safety, stabilization and treatment             -- Daily contact with patient to assess and evaluate symptoms and progress in treatment             -- Patient's case to be discussed in multi-disciplinary team meeting             -- Observation Level: q15 minute checks             -- Vital signs:  q12 hours             -- Precautions: suicide, elopement, and assault   2. Psychiatric Diagnoses and Treatment:  Non emergent forced medication consult appreciated completed by Dr. Srivastava and approved             Schizoaffective disorder             -Depakote  500 mg twice daily for mood lability             --  Continue  gabapentin  100 mg 3 3 times daily for mood and anxiety             -- Restart Haldol  10 mg nightly will titrate-if refuses patient will receive IM Haldol  10 mg nightly as enforced medication approved by 2 psychiatrists.             -- Both allergy to Zyprexa  and Seroquel  are noted on chart review, however no evidence of a true allergic reaction.    -- The risks/benefits/side-effects/alternatives to this medication were discussed in detail with the patient and time was given for questions. The patient consents to medication trial.                -- Metabolic profile and EKG monitoring obtained while on an atypical antipsychotic (BMI: Lipid Panel: HbgA1c: QTc:)              -- Encouraged patient to participate in unit milieu and in scheduled group therapies                            3. Medical Issues Being Addressed:     no acute concerns  4. Discharge Planning:   -- Social work and case management to assist with discharge planning and identification of hospital follow-up needs prior to discharge  -- Estimated LOS: 3-4 days  Allyn Foil, MD 10/15/2023, 10:16 PM

## 2023-10-15 NOTE — Progress Notes (Signed)
 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  Potential harms if unmedicated:  - He appears to have judgement sufficiently impaired as to not be able to interpret social cues and situations at an appropriate level for independence. He would be at a significant risk for poor outcome if he were to continue on his current path. Coupled with the downward trajectory of illness, his impairment is likely to continue to decline.   Benefits of medications versus risks: The risks of medications for this patient appear to be no greater than another, based on overall health. He would be at standard risk for side effects from medications. Potential benefits would include improved insight, and perhaps future seeking of appropriate treatment. Without medications he would likely not improve with regards to insight, as he has not done so since the last hospital stay. He may improve with medications . Given his verbal skills, he may improve enough to obtain proper employment and self-advocacy in the future.    In this setting, I think that it would be appropriate for a trial of forced medications to assess for improvement.

## 2023-10-15 NOTE — Plan of Care (Signed)
  Problem: Education: Goal: Knowledge of [place] [facility] Education information/materials will improve Outcome: Progressing Goal: Mental status will improve Outcome: Progressing Goal: Verbalization of understanding the information provided will improve Outcome: Progressing   Problem: Activity: Goal: Interest or engagement in activities will improve Outcome: Progressing   Problem: Coping: Goal: Ability to verbalize frustrations and anger appropriately will improve Outcome: Progressing

## 2023-10-15 NOTE — Group Note (Signed)
 Date:  10/15/2023 Time:  10:17 AM  Group Topic/Focus:  Goals Group:   The focus of this group is to help patients establish daily goals to achieve during treatment and discuss how the patient can incorporate goal setting into their daily lives to aide in recovery.    Participation Level:  Did Not Attend   Ronald [name] 10/15/2023, 10:17 AM

## 2023-10-15 NOTE — BH IP Treatment Plan (Signed)
 Interdisciplinary Treatment and Diagnostic Plan Update  10/15/2023 Time of Session: 10:16 AM Ronald [name] MRN: [id number]  Principal Diagnosis: Schizoaffective disorder [facility])  Secondary Diagnoses: Principal Problem:   Schizoaffective disorder (HCC)   Current Medications:  Current Facility-Administered Medications  Medication Dose Route Frequency Provider Last Rate Last Admin   acetaminophen  (TYLENOL ) tablet 650 mg  650 mg Oral Q6H PRN Coleman, Carolyn H, NP       alum & mag hydroxide-simeth (MAALOX/MYLANTA) 200-200-20 MG/5ML suspension 30 mL  30 mL Oral Q4H PRN Coleman, Carolyn H, NP       benztropine (COGENTIN) tablet 1 mg  1 mg Oral BID PRN Millington, Matthew E, PA-C       Or   benztropine mesylate (COGENTIN) injection 1 mg  1 mg Intramuscular BID PRN Millington, Matthew E, PA-C       divalproex  (DEPAKOTE ) DR tablet 500 mg  500 mg Oral BID Millington, Matthew E, PA-C   500 mg at 10/15/23 9146   gabapentin  (NEURONTIN ) capsule 100 mg  100 mg Oral TID Coleman, Carolyn H, NP   100 mg at 10/15/23 9146   haloperidol  (HALDOL ) tablet 10 mg  10 mg Oral QHS Coleman, Carolyn H, NP   10 mg at 10/13/23 2126   haloperidol  (HALDOL ) tablet 5 mg  5 mg Oral TID PRN Mardy Elveria DEL, NP       haloperidol  lactate (HALDOL ) injection 10 mg  10 mg Intramuscular TID PRN Coleman, Carolyn H, NP   10 mg at 10/14/23 2124   magnesium  hydroxide (MILK OF MAGNESIA) suspension 30 mL  30 mL Oral Daily PRN Mardy Elveria DEL, NP       nicotine  (NICODERM CQ  - dosed in mg/24 hours) patch 14 mg  14 mg Transdermal Daily Jadapalle, Sree, MD   14 mg at 10/13/23 1638   nicotine  polacrilex (NICORETTE ) gum 2 mg  2 mg Oral PRN Jadapalle, Sree, MD   2 mg at 10/14/23 1243   propranolol (INDERAL) tablet 10 mg  10 mg Oral BID PRN Millington, Matthew E, PA-C       traZODone  (DESYREL ) tablet 50 mg  50 mg Oral QHS PRN Coleman, Carolyn H, NP   50 mg at 10/14/23 2124   PTA Medications: Medications Prior to Admission  Medication  Sig Dispense Refill Last Dose/Taking   divalproex  (DEPAKOTE  ER) 500 MG 24 hr tablet Take 2 tablets (1,000 mg total) by mouth 2 times daily at 12 noon and 4 pm. 120 tablet 0    gabapentin  (NEURONTIN ) 100 MG capsule Take 2 capsules (200 mg total) by mouth 3 (three) times daily. 180 capsule 3    haloperidol  (HALDOL ) 10 MG tablet Take 3 tablets (30 mg total) by mouth at bedtime. 90 tablet 0    MAGNESIUM  OXIDE, ELEMENTAL, PO Take 500 mg by mouth at bedtime.      Multiple Vitamin (MULTIVITAMIN WITH MINERALS) TABS tablet Take 1 tablet by mouth daily with breakfast.      nicotine  (NICODERM CQ  - DOSED IN MG/24 HOURS) 14 mg/24hr patch Place 1 patch (14 mg total) onto the skin daily. 28 patch 0    traZODone  (DESYREL ) 100 MG tablet Take 1 tablet (100 mg total) by mouth at bedtime. 30 tablet 0     Patient Stressors: Other: thinking about family that has passed away    Patient Strengths: Ability for insight  Supportive family/friends   Treatment Modalities: Medication Management, Group therapy, Case management,  1 to 1 session with clinician, Psychoeducation, Recreational therapy.  Physician Treatment Plan for Primary Diagnosis: Schizoaffective disorder (HCC) Long Term Goal(s): Improvement in symptoms so as ready for discharge   Short Term Goals: Ability to maintain clinical measurements within normal limits will improve Compliance with prescribed medications will improve Ability to identify triggers associated with substance abuse/mental health issues will improve  Medication Management: Evaluate patient's response, side effects, and tolerance of medication regimen.  Therapeutic Interventions: 1 to 1 sessions, Unit Group sessions and Medication administration.  Evaluation of Outcomes: Not Met  Physician Treatment Plan for Secondary Diagnosis: Principal Problem:   Schizoaffective disorder (HCC)  Long Term Goal(s): Improvement in symptoms so as ready for discharge   Short Term Goals: Ability  to maintain clinical measurements within normal limits will improve Compliance with prescribed medications will improve Ability to identify triggers associated with substance abuse/mental health issues will improve     Medication Management: Evaluate patient's response, side effects, and tolerance of medication regimen.  Therapeutic Interventions: 1 to 1 sessions, Unit Group sessions and Medication administration.  Evaluation of Outcomes: Not Met   RN Treatment Plan for Primary Diagnosis: Schizoaffective disorder (HCC) Long Term Goal(s): Knowledge of disease and therapeutic regimen to maintain health will improve  Short Term Goals: Ability to remain free from injury will improve, Ability to verbalize frustration and anger appropriately will improve, Ability to demonstrate self-control, Ability to participate in decision making will improve, Ability to verbalize feelings will improve, Ability to disclose and discuss suicidal ideas, Ability to identify and develop effective coping behaviors will improve, and Compliance with prescribed medications will improve  Medication Management: RN will administer medications as ordered by provider, will assess and evaluate patient's response and provide education to patient for prescribed medication. RN will report any adverse and/or side effects to prescribing provider.  Therapeutic Interventions: 1 on 1 counseling sessions, Psychoeducation, Medication administration, Evaluate responses to treatment, Monitor vital signs and CBGs as ordered, Perform/monitor CIWA, COWS, AIMS and Fall Risk screenings as ordered, Perform wound care treatments as ordered.  Evaluation of Outcomes: Not Met   LCSW Treatment Plan for Primary Diagnosis: Schizoaffective disorder (HCC) Long Term Goal(s): Safe transition to appropriate next level of care at discharge, Engage patient in therapeutic group addressing interpersonal concerns.  Short Term Goals: Engage patient in  aftercare planning with referrals and resources, Increase social support, Increase ability to appropriately verbalize feelings, Increase emotional regulation, Facilitate acceptance of mental health diagnosis and concerns, Facilitate patient progression through stages of change regarding substance use diagnoses and concerns, Identify triggers associated with mental health/substance abuse issues, and Increase skills for wellness and recovery  Therapeutic Interventions: Assess for all discharge needs, 1 to 1 time with Social worker, Explore available resources and support systems, Assess for adequacy in community support network, Educate family and significant other(s) on suicide prevention, Complete Psychosocial Assessment, Interpersonal group therapy.  Evaluation of Outcomes: Not Met   Progress in Treatment: Attending groups: Yes. Participating in groups: Yes. Taking medication as prescribed: Yes. Toleration medication: Yes. Family/Significant other contact made: Yes, individual(s) contacted:  Education Completed; [name], mother, [phone],  (name of family member/significant other) has been identified by the patient as the family member/significant other with whom the patient will be residing, and identified as the person(s) who will aid the patient in the event of a mental health crisis (suicidal ideations/suicide attempt).  With written consent from the patient, the family member/significant other has been provided the following suicide prevention education, prior to the and/or following the discharge of the patient. Patient understands  diagnosis: Yes. Discussing patient identified problems/goals with staff: Yes. Medical problems stabilized or resolved: Yes. Denies suicidal/homicidal ideation: Yes. Issues/concerns per patient self-inventory: No. Other: None   New problem(s) identified: Yes, Describe:  Patient became irate and was unable to be redirected during treatment team meeting and  had to be escorted out for safety precautions.   New Short Term/Long Term Goal(s):detox, elimination of symptoms of psychosis, medication management for mood stabilization; elimination of SI thoughts; development of comprehensive mental wellness/sobriety plan.    Patient Goals:  To ger my medications regulated.  Discharge Plan or Barriers: CSW to assist with the development of appropriate discharge plan.    Reason for Continuation of Hospitalization: Aggression Anxiety Delusions  Depression Hallucinations [place] Medical Issues Medication stabilization Suicidal ideation  Estimated Length of Stay: 1-7 days.   Last 3 [place] Suicide Severity Risk Score: Flowsheet Row Admission (Current) from 10/13/2023 in [facility]ICINE Most recent reading at 10/13/2023  4:07 PM ED from 10/13/2023 in [facility] Most recent reading at 10/13/2023  8:14 AM [name] Most recent reading at 08/16/2023 12:50 PM  C-SSRS RISK CATEGORY No Risk No Risk No Risk    Last PHQ 2/9 Scores:    06/13/2023    8:06 AM 11/08/2022   10:00 AM 01/14/2022    1:09 PM  Depression screen PHQ 2/9  Decreased Interest 0 0 0  Down, Depressed, Hopeless 0 0 0  PHQ - 2 Score 0 0 0  Altered sleeping 0 0 0  Tired, decreased energy 0 0 0  Change in appetite 0 0 0  Feeling bad or failure about yourself  0 0 0  Trouble concentrating 0 0 0  Moving slowly or fidgety/restless 0 0 0  Suicidal thoughts 0 0 0  PHQ-9 Score 0 0 0  Difficult doing work/chores Not difficult at all      Scribe for Treatment Team: Keoni Risinger M Orvan Papadakis, LCSW 10/15/2023 11:08 AM

## 2023-10-15 NOTE — Group Note (Signed)
 Date:  10/15/2023 Time:  10:11 PM  Group Topic/Focus:  Wrap-Up Group:   The focus of this group is to help patients review their daily goal of treatment and discuss progress on daily workbooks.    Participation Level:  Active  Participation Quality:  Sharing  Affect:  Appropriate  Cognitive:  Appropriate  Insight: Good  Engagement in Group:  Engaged  Modes of Intervention:  Discussion  Additional Comments:  Chrystopher says he had a great day. Patient finally spoke with his mom which went well for the both of them.   Kristen H Elyshia Kumagai 10/15/2023, 10:11 PM

## 2023-10-16 DIAGNOSIS — F250 Schizoaffective disorder, bipolar type: Secondary | ICD-10-CM | POA: Diagnosis not present

## 2023-10-16 MED ORDER — GABAPENTIN 100 MG PO CAPS
200.0000 mg | ORAL_CAPSULE | Freq: Three times a day (TID) | ORAL | Status: DC
  Administered 2023-10-17 – 2023-10-22 (×17): 200 mg via ORAL
  Filled 2023-10-16 (×17): qty 2

## 2023-10-16 MED ORDER — HALOPERIDOL LACTATE 5 MG/ML IJ SOLN
10.0000 mg | Freq: Every day | INTRAMUSCULAR | Status: DC
  Administered 2023-10-16: 10 mg via INTRAMUSCULAR
  Filled 2023-10-16: qty 2

## 2023-10-16 MED ORDER — HALOPERIDOL 5 MG PO TABS
10.0000 mg | ORAL_TABLET | Freq: Every day | ORAL | Status: DC
  Administered 2023-10-17: 10 mg via ORAL
  Filled 2023-10-16: qty 2

## 2023-10-16 NOTE — Plan of Care (Signed)
   Problem: Education: Goal: Emotional status will improve Outcome: Progressing Goal: Mental status will improve Outcome: Progressing

## 2023-10-16 NOTE — BHH Counselor (Signed)
 CSW spoke with [name], mother, [phone].  Mother reports pt has a really big problem now.    Mother reports that over 100 vials of THC and vaping cartridges. Mother reports that the patient hasn't taken his medications for months.  She reports I have a paper [employer] bag full of empty cartridges.  Mother reports that she found bottles of  pts Haldol , Depakote  (4 or 5 bottles of that one) and Trazodone  in his closet.   She repots that the patient keeps calling me saying weird things.   Mother reports that the patient is yelling at me about taking my medication when I ask him about taking his medications.    Mother reports finding empty boxes od CBD oil.   She reports that she has been unable to go upstairs where pt resides, since January.   Mother reiterates that patient does NOT have any drug allergies that she is aware of.  She also reports that patient is complaining of being hungry and not being fed enough.  CSW informed mother that the information above has been relayed to treatment team as mother has previously made this Clinical research associate aware.  CSW reiterated that she will remind treatment team of the above information.   CSW discussed with mother practicing some coping skills and self-care to address her concerns.    CSW provided mother with empathetic listening and support.  CSW discussed with the mother seeking guardianship through the county Department of Social Services as mother began discussing concerns of who would care for patient if she were to expire from the cancer.   CSW to speak with patient on recommendation of an ACT team.   [name], MSW, LCSW 10/16/2023 11:58 AM

## 2023-10-16 NOTE — Group Note (Signed)
 Recreation Therapy Group Note   Group Topic:Health and Wellness  Group Date: 10/16/2023 Start Time: 0950 End Time: 1050 Facilitators: Celestia Jeoffrey BRAVO, LRT, CTRS Location: Courtyard  Group Description: [employer]. LRT and patients played games of basketball, drew with chalk, and played corn hole while outside in the courtyard while getting fresh air and sunlight. Music was being played in the background. LRT and peers conversed about different games they have played before, what they do in their free time and anything else that is on their minds. LRT encouraged pts to drink water  after being outside, sweating and getting their heart rate up.  Goal Area(s) Addressed: Patient will build on frustration tolerance skills. Patients will partake in a competitive play game with peers. Patients will gain knowledge of new leisure interest/hobby.    Affect/Mood: Appropriate   Participation Level: Active and Engaged   Participation Quality: Independent   Behavior: Appropriate, Calm, and Cooperative   Speech/Thought Process: Coherent   Insight: Good   Judgement: Good   Modes of Intervention: Activity   Patient Response to Interventions:  Attentive and Receptive   Education Outcome:  Acknowledges education   Clinical Observations/Individualized Feedback: Ronald [name] was active in their participation of session activities and group discussion. Pt interacted well with LRT and peers duration of session.    Plan: Continue to engage patient in RT group sessions 2-3x/week.   Jeoffrey BRAVO Celestia, LRT, CTRS 10/16/2023 11:05 AM

## 2023-10-16 NOTE — Group Note (Signed)
 Date:  10/16/2023 Time:  11:09 AM  Group Topic/Focus:  Self Care:   The focus of this group is to help patients understand the importance of self-care in order to improve or restore emotional, physical, spiritual, interpersonal, and financial health.    Participation Level:  Active  Participation Quality:  Appropriate  Affect:  Appropriate  Cognitive:  Appropriate  Insight: Appropriate  Engagement in Group:  Engaged  Modes of Intervention:  Activity  Additional Comments:    Camellia HERO Sohum Delillo 10/16/2023, 11:09 AM

## 2023-10-16 NOTE — Progress Notes (Addendum)
 [facility] MD Progress Note  10/16/2023 2:59 PM Ronald MURRI  MRN:  [id number] Patient is a 43 year old male who presented to the ED on 6/30 due to concerns about acute anxiety.  He has a psychiatric history of schizoaffective disorder bipolar type, GAD, PTSD, cannabis use disorder, and benzodiazepine use disorder.  There is no significant medical history reported.  Patient was recently discharged from [facility] on 09/02/2023.  He reports he did not follow-up following being discharged from the hospital and that he has been partially compliant with his medications reporting that he has been taking the Depakote    Subjective:  Chart reviewed, case discussed in multidisciplinary meeting, patient seen during rounds.   7/3: On interview patient is found outside in the common area.  They are interacting with other patients and staff appropriately.  They apologized for their previous behavior during this admission indicate they are agreeable with taking Haldol  and Depakote  at this time.  They report they allowed for them to draw lab level yesterday.  They deny current SI, HI, and AVH.  They are still somewhat tangential but are easily redirectable today.  They note their anxiety has been improved.  They do not discuss Ativan  today.  They are pleasant and cooperative with exam.  Discussed LAI at discharge and they requested more information, will provide them with this tomorrow they are open to considering LAI.  7/2: On interview patient is noted to be resting in bed.  He remains fixated on needing Ativan  to help with the anxiety.  When asked about the reason for his visit he talks about watching TV and thinking about grandparents and having an anxiety attack which prompted his ED visit.  He reports Klonopin  was the only medication that helped him and he does not require any Haldol  or any other medications.  He talks about his patient rides stating that he will not be taking any other medications after discharge and he will  take dose only during the hospitalization.  Dr. Kenn was on the phone through [employer] as a second physician to approve enforced medication.  Patient reports that oral medications do not do well for him and he prefers IM injections.  Provider and second physician Dr. Srivastava tried to educate him on LAI which is an injectable form that he gets once in a month but patient exhibits concrete thought process and states he is going to stop all his medications on discharge and he is not going to take any LAI's.  He is not endorsing SI/HI and not responding to internal stimuli at this time.   Sleep: Fair  Appetite:  Fair  Past Psychiatric History: see h&P Family History: History reviewed. No pertinent family history. Social History:  Social History   Substance and Sexual Activity  Alcohol Use No     Social History   Substance and Sexual Activity  Drug Use Yes   Types: Marijuana   Comment: one year ago    Social History   Socioeconomic History   Marital status: Single    Spouse name: Not on file   Number of children: Not on file   Years of education: Not on file   Highest education level: Not on file  Occupational History   Not on file  Tobacco Use   Smoking status: Never   Smokeless tobacco: Current    Types: Chew  Vaping Use   Vaping status: Never Used  Substance and Sexual Activity   Alcohol use: No   Drug use:  Yes    Types: Marijuana    Comment: one year ago   Sexual activity: Not Currently  Other Topics Concern   Not on file  Social History Narrative   Not on file   Social Drivers of Health   Financial Resource Strain: Not on file  Food Insecurity: No Food Insecurity (10/13/2023)   Hunger Vital Sign    Worried About Running Out of Food in the Last Year: Never true    Ran Out of Food in the Last Year: Never true  Transportation Needs: No Transportation Needs (10/13/2023)   PRAPARE - Administrator, Civil Service (Medical): No    Lack of  Transportation (Non-Medical): No  Physical Activity: Not on file  Stress: Not on file  Social Connections: Not on file   Past Medical History:  Past Medical History:  Diagnosis Date   Anxiety    Arthritis    Disorder of pineal gland    Post traumatic stress disorder    Schizoaffective disorder, bipolar type (HCC) 08/29/2023   Severe manic bipolar 1 disorder with psychotic behavior (HCC) 08/29/2023    Past Surgical History:  Procedure Laterality Date   ANKLE SURGERY      Current Medications: Current Facility-Administered Medications  Medication Dose Route Frequency Provider Last Rate Last Admin   acetaminophen  (TYLENOL ) tablet 650 mg  650 mg Oral Q6H PRN Coleman, Carolyn H, NP       alum & mag hydroxide-simeth (MAALOX/MYLANTA) 200-200-20 MG/5ML suspension 30 mL  30 mL Oral Q4H PRN Coleman, Carolyn H, NP       benztropine (COGENTIN) tablet 1 mg  1 mg Oral BID PRN Tyshon Fanning E, PA-C       Or   benztropine mesylate (COGENTIN) injection 1 mg  1 mg Intramuscular BID PRN Chancellor Vanderloop E, PA-C       divalproex  (DEPAKOTE ) DR tablet 500 mg  500 mg Oral BID Ala Kratz E, PA-C   500 mg at 10/16/23 9150   gabapentin  (NEURONTIN ) capsule 100 mg  100 mg Oral TID Coleman, Carolyn H, NP   100 mg at 10/16/23 1228   haloperidol  (HALDOL ) tablet 10 mg  10 mg Oral QHS Coleman, Carolyn H, NP   10 mg at 10/13/23 2126   haloperidol  (HALDOL ) tablet 5 mg  5 mg Oral TID PRN Mardy Elveria DEL, NP       haloperidol  lactate (HALDOL ) injection 10 mg  10 mg Intramuscular TID PRN Coleman, Carolyn H, NP   10 mg at 10/15/23 2050   magnesium  hydroxide (MILK OF MAGNESIA) suspension 30 mL  30 mL Oral Daily PRN Mardy Elveria DEL, NP       nicotine  (NICODERM CQ  - dosed in mg/24 hours) patch 14 mg  14 mg Transdermal Daily Jadapalle, Sree, MD   14 mg at 10/16/23 9149   nicotine  polacrilex (NICORETTE ) gum 2 mg  2 mg Oral PRN Jadapalle, Sree, MD   2 mg at 10/14/23 1243   propranolol (INDERAL) tablet  10 mg  10 mg Oral BID PRN Delon Revelo E, PA-C       traZODone  (DESYREL ) tablet 50 mg  50 mg Oral QHS PRN Coleman, Carolyn H, NP   50 mg at 10/15/23 2050    Lab Results:  Results for orders placed or performed during the hospital encounter of 10/13/23 (from the past 48 hours)  Valproic acid  level     Status: None   Collection Time: 10/15/23  6:40 AM  Result Value Ref  Range   Valproic Acid  Lvl 53 50 - 100 ug/mL    Comment: Performed at [facility], [address] [place], [name] [address]    Blood Alcohol level:  Lab Results  Component Value Date   [facility] <15 10/13/2023   [facility] <15 08/15/2023    Metabolic Disorder Labs: Lab Results  Component Value Date   HGBA1C 4.9 05/17/2023   MPG 93.93 05/17/2023   MPG 93.93 09/29/2021   No results found for: PROLACTIN Lab Results  Component Value Date   CHOL 152 05/17/2023   TRIG 70 05/17/2023   HDL 78 05/17/2023   CHOLHDL 1.9 05/17/2023   VLDL 14 05/17/2023   LDLCALC 60 05/17/2023   LDLCALC 55 02/19/2020    Physical Findings: AIMS:  , ,  ,  ,    CIWA:    COWS:      Psychiatric Specialty Exam:  Presentation  General Appearance:  Casual  Eye Contact: Fleeting  Speech: Normal Rate  Speech Volume: Normal    Mood and Affect  Mood: Labile  Affect: Congruent   Thought Process  Thought Processes: Coherent  Descriptions of Associations:Intact  Orientation:Full (Time, Place and Person)  Thought Content:Logical  Hallucinations:Hallucinations: None  Ideas of Reference:None  Suicidal Thoughts:Suicidal Thoughts: No  Homicidal Thoughts:Homicidal Thoughts: No   Sensorium  Memory: Immediate Fair  Judgment: Impaired  Insight: Lacking   Executive Functions  Concentration: Fair  Attention Span: Good  Recall: Good  Fund of Knowledge: Good  Language: Good   Psychomotor Activity  Psychomotor Activity: Psychomotor Activity: Restlessness  Musculoskeletal: Strength &  Muscle Tone: within normal limits Gait & Station: normal Assets  Assets: Manufacturing systems engineer; Desire for Improvement; Vocational/Educational; Physical Health; Housing    Physical Exam: Physical Exam Vitals and nursing note reviewed.  HENT:     Head: Atraumatic.  Eyes:     Extraocular Movements: Extraocular movements intact.  Pulmonary:     Effort: Pulmonary effort is normal.  Neurological:     Mental Status: He is alert and oriented to person, place, and time.    Review of Systems  Psychiatric/Behavioral:  Negative for hallucinations, substance abuse and suicidal ideas.    Blood pressure 114/83, pulse 68, temperature 97.8 F (36.6 C), resp. rate 17, height 5' 11 (1.803 m), weight 77.1 kg, SpO2 99%. Body mass index is 23.71 kg/m.  Diagnosis: Principal Problem:   Schizoaffective disorder [facility])    Clinical Decision Making:   Treatment Plan Summary: 43 year old male with schizoaffective disorder bipolar type, generalized anxiety disorder, post-traumatic stress disorder, cannabis use disorder, and benzodiazepine use disorder, recently discharged from inpatient care on 5?/?20?/?25, now presenting with acute anxiety after viewing a television program that triggered traumatic memories of his brother's murder and his grandfather's death, exhibiting partial Depakote  adherence, no other confirmed pharmacologic compliance, poor outpatient follow-up, and severely impaired insight and judgment. 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 They denied current suicidal or homicidal ideation, hallucinations, or overt mania; no response to internal stimuli observed. He repeatedly requested Ativan  and displayed drug-seeking behavior consistent with his diagnosed benzodiazepine use disorder, emphasizing the need for careful controlled-substance management. Allergies to  olanzapine  and quetiapine  are charted but unsubstantiated. Given his agitation, poor insight, medication non-adherence, multiple recent hospitalizations, and active benzodiazepine-seeking, he meets criteria for continued inpatient psychiatric hospitalization for further diagnostic clarification, medication optimization, substance-use management, and stabilization.   Safety and Monitoring:             --  Admission to inpatient psychiatric unit for safety, stabilization and treatment             -- Daily contact with patient to assess and evaluate symptoms and progress in treatment             -- Patient's case to be discussed in multi-disciplinary team meeting             -- Observation Level: q15 minute checks             -- Vital signs:  q12 hours             -- Precautions: suicide, elopement, and assault   2. Psychiatric Diagnoses and Treatment:  Non emergent forced medication consult appreciated completed by Dr. Srivastava and approved             Schizoaffective disorder             -Depakote  500 mg twice daily for mood lability, level on 7/2 was 53             -- Continue gabapentin  100 mg 3 times daily for mood and anxiety, increased back to home dose of 200 mg TID             -- Restart Haldol  10 mg nightly will titrate-if refuses patient will receive IM Haldol  10 mg nightly as enforced medication approved by 2 psychiatrists.             -- Both allergy to Zyprexa  and Seroquel  are noted on chart review, however no evidence of a true allergic reaction.    -- The risks/benefits/side-effects/alternatives to this medication were discussed in detail with the patient and time was given for questions. The patient consents to medication trial.                -- Metabolic profile and EKG monitoring obtained while on an atypical antipsychotic (BMI: Lipid Panel: HbgA1c: QTc:)              -- Encouraged patient to participate in unit milieu and in scheduled group therapies                             3. Medical Issues Being Addressed:     no acute concerns  4. Discharge Planning:   -- Social work and case management to assist with discharge planning and identification of hospital follow-up needs prior to discharge  -- Estimated LOS: 3-4 days  Ronald FORBES Right, PA-C 10/16/2023, 2:59 PM

## 2023-10-16 NOTE — Progress Notes (Signed)
 Pt pleasant during assessment denying SI/HI/AVH. Pt observed by this Clinical research associate interacting appropriately with staff and peers on the unit. Pt requested to get his Haldol  by IM and not PO. Pt compliant with medication administration per MD orders. Pt given education, support, and encouragement to be active in his treatment plan. Pt being monitored Q 15 minutes for safety per unit protocol, remains safe on the unit

## 2023-10-16 NOTE — Plan of Care (Signed)
   Problem: Education: Goal: Knowledge of [facility] Education information/materials will improve Outcome: Progressing Goal: Emotional status will improve Outcome: Progressing Goal: Mental status will improve Outcome: Progressing Goal: Verbalization of understanding the information provided will improve Outcome: Progressing

## 2023-10-16 NOTE — Group Note (Signed)
 Date:  10/16/2023 Time:  9:32 PM  Group Topic/Focus:  Wrap-Up Group:   The focus of this group is to help patients review their daily goal of treatment and discuss progress on daily workbooks.    Participation Level:  Minimal  Participation Quality:  Appropriate  Affect:  Appropriate and Flat  Cognitive:  Alert  Insight: Appropriate  Engagement in Group:  Lacking  Modes of Intervention:  Discussion  Additional Comments:     [name],Ronald Coor E 10/16/2023, 9:32 PM

## 2023-10-16 NOTE — Group Note (Signed)
 [facility] LCSW Group Therapy Note   Group Date: 10/16/2023 Start Time: 1300 End Time: 1400   Type of Therapy/Topic:  Group Therapy:  Balance in Life  Participation Level:  Active   Description of Group:    This group will address the concept of balance and how it feels and looks when one is unbalanced. Patients will be encouraged to process areas in their lives that are out of balance, and identify reasons for remaining unbalanced. Facilitators will guide patients utilizing problem- solving interventions to address and correct the stressor making their life unbalanced. Understanding and applying boundaries will be explored and addressed for obtaining  and maintaining a balanced life. Patients will be encouraged to explore ways to assertively make their unbalanced needs known to significant others in their lives, using other group members and facilitator for support and feedback.  Therapeutic Goals: Patient will identify two or more emotions or situations they have that consume much of in their lives. Patient will identify signs/triggers that life has become out of balance:  Patient will identify two ways to set boundaries in order to achieve balance in their lives:  Patient will demonstrate ability to communicate their needs through discussion and/or role plays  Summary of Patient Progress: Patient was present in group.  Patient was somewhat disruptive and required redirection.  Patient began discussing how he wanted society to go back to the 90's. Patient then went off on a tangent on how [facility] changing the names of streets was problematic and created issues and divide among the races. Patient did not respond to redirection positively, however, with continue deescalation and encouragement pt did eventually respond.  Patient displayed poor insight.  Therapeutic Modalities:   Cognitive Behavioral Therapy Solution-Focused Therapy Assertiveness Training   Sherryle JINNY Margo, LCSW

## 2023-10-16 NOTE — Group Note (Signed)
 Recreation Therapy Group Note   Group Topic:Healthy Support Systems  Group Date: 10/16/2023 Start Time: 1530 End Time: 1620 Facilitators: Celestia Jeoffrey FORBES ARTICE, CTRS Location: Craft Room  Group Description: Straw Bridge. In groups or individually, patients were given 10 plastic drinking straws and an equal length of masking tape. Using the materials provided, patients were instructed to build a free-standing bridge-like structure to suspend an everyday item (ex: deck of cards) off the floor or table surface. All materials were required to be used in Secondary school teacher. LRT facilitated post-activity discussion reviewing the importance of having strong and healthy support systems in our lives. LRT discussed how the people in our lives serve as the tape and the deck of cards we placed on top of our straw structure are the stressors we face in daily life. LRT and pts discussed what happens in our life when things get too heavy for us , and we don't have strong supports outside of the hospital. Pt shared 2 of their healthy supports in their life aloud in the group.   Goal Area(s) Addressed:  Patient will identify 2 healthy supports in their life. Patient will identify skills to successfully complete activity. Patient will identify correlation of this activity to life post-discharge.  Patient will build on frustration tolerance skills. Patient will increase team building and communication skills.    Affect/Mood: Appropriate   Participation Level: Active and Engaged   Participation Quality: Independent   Behavior: Appropriate, Calm, and Cooperative   Speech/Thought Process: Coherent   Insight: Fair   Judgement: Good   Modes of Intervention: STEM Activity   Patient Response to Interventions:  Attentive, Engaged, and Receptive   Education Outcome:  Acknowledges education   Clinical Observations/Individualized Feedback: Ronald [name] was active in their participation of session activities and group  discussion. Pt identified my mom and puppy as healthy supports. Pt interacted well with LRT and peers duration of session.    Plan: Continue to engage patient in RT group sessions 2-3x/week.   [id number] LRT, CTRS 10/16/2023 5:18 PM

## 2023-10-16 NOTE — Progress Notes (Signed)
 08:00-20:00 Shift.   No behavioral problems this shift, pt attended groups, denied any avh/hi/si, has been med compliant, no prns needed or requested for thus far. Pt is being monitored as ordered.     10/16/23 1645  Psych Admission Type (Psych Patients Only)  Admission Status Voluntary  Psychosocial Assessment  Patient Complaints Anxiety  Eye Contact Brief  Facial Expression Anxious  Affect Anxious  Speech Logical/coherent  Interaction Assertive  Motor Activity Slow  Appearance/Hygiene Unremarkable  Behavior Characteristics Cooperative  Mood Pleasant  Thought Process  Coherency WDL  Content WDL  Delusions None reported or observed  Perception WDL  Hallucination None reported or observed  Judgment Limited  Confusion None  Danger to Self  Current suicidal ideation? Denies  Self-Injurious Behavior No self-injurious ideation or behavior indicators observed or expressed   Agreement Not to Harm Self No  Description of Agreement Verbal  Danger to Others  Danger to Others None reported or observed

## 2023-10-17 DIAGNOSIS — F250 Schizoaffective disorder, bipolar type: Secondary | ICD-10-CM | POA: Diagnosis not present

## 2023-10-17 NOTE — Plan of Care (Signed)

## 2023-10-17 NOTE — Group Note (Signed)
 Recreation Therapy Group Note   Group Topic:General Recreation  Group Date: 10/17/2023 Start Time: 1030 End Time: 1130 Facilitators: Celestia Jeoffrey BRAVO, LRT, CTRS Location: Courtyard  Group Description: [employer]. LRT and patients played games of basketball, drew with chalk, and played corn hole while outside in the courtyard while getting fresh air and sunlight. Music was being played in the background. LRT and peers conversed about different games they have played before, what they do in their free time and anything else that is on their minds. LRT encouraged pts to drink water  after being outside, sweating and getting their heart rate up.  Goal Area(s) Addressed: Patient will build on frustration tolerance skills. Patients will partake in a competitive play game with peers. Patients will gain knowledge of new leisure interest/hobby.    Affect/Mood: Appropriate   Participation Level: Active   Participation Quality: Independent   Behavior: Appropriate   Speech/Thought Process: Coherent   Insight: Fair   Judgement: Fair    Modes of Intervention: Activity   Patient Response to Interventions:  Receptive   Education Outcome:  Acknowledges education   Clinical Observations/Individualized Feedback: Ronald [name] was active in their participation of session activities and group discussion. Pt interacted well with LRT and peers duration of session.    Plan: Continue to engage patient in RT group sessions 2-3x/week.   Jeoffrey BRAVO Celestia, LRT, CTRS 10/17/2023 11:46 AM

## 2023-10-17 NOTE — Progress Notes (Signed)
 [facility] MD Progress Note  10/17/2023 2:58 PM Ronald [name]  MRN:  [id number] Patient is a 43 year old male who presented to the ED on 6/30 due to concerns about acute anxiety.  He has a psychiatric history of schizoaffective disorder bipolar type, GAD, PTSD, cannabis use disorder, and benzodiazepine use disorder.  There is no significant medical history reported.  Patient was recently discharged from [facility] on 09/02/2023.  He reports he did not follow-up following being discharged from the hospital and that he has been partially compliant with his medications reporting that he has been taking the Depakote    Subjective:  Chart reviewed, case discussed in multidisciplinary meeting, patient seen during rounds.   7/4: On interview patient is found in common area he is watching music videos with the other patients.  He is pleasant and cooperative.  On exam today he notes his anxiety is improved he feels he is tolerating the medications well he still has some paranoia about the oral Haldol  and indicates he would prefer to continue taking the IM injections nightly.  He denies SI, HI, and AVH.  He is more linear on exam today demonstrates proved insight into the need for ongoing medication management.  He is provided with the patient education on long-acting injectables and indicates he will review and make a decision on whether or not he is willing to receive the long-acting injectable.  Discussed risk vs benefits and patient verbalized understanding.  Patient notes stable appetite and sleep.  They voiced no concerns or complaints at this time.  They received as needed trazodone  and Nicorette  overnight.  No medication adjustments continue to encourage patient to take oral medications.  7/3: On interview patient is found outside in the common area.  They are interacting with other patients and staff appropriately.  They apologized for their previous behavior during this admission indicate they are agreeable with taking  Haldol  and Depakote  at this time.  They report they allowed for them to draw lab level yesterday.  They deny current SI, HI, and AVH.  They are still somewhat tangential but are easily redirectable today.  They note their anxiety has been improved.  They do not discuss Ativan  today.  They are pleasant and cooperative with exam.  Discussed LAI at discharge and they requested more information, will provide them with this tomorrow they are open to considering LAI.  7/2: On interview patient is noted to be resting in bed.  He remains fixated on needing Ativan  to help with the anxiety.  When asked about the reason for his visit he talks about watching TV and thinking about grandparents and having an anxiety attack which prompted his ED visit.  He reports Klonopin  was the only medication that helped him and he does not require any Haldol  or any other medications.  He talks about his patient rides stating that he will not be taking any other medications after discharge and he will take dose only during the hospitalization.  Dr. Kenn was on the phone through [employer] as a second physician to approve enforced medication.  Patient reports that oral medications do not do well for him and he prefers IM injections.  Provider and second physician Dr. Srivastava tried to educate him on LAI which is an injectable form that he gets once in a month but patient exhibits concrete thought process and states he is going to stop all his medications on discharge and he is not going to take any LAI's.  He is not endorsing  SI/HI and not responding to internal stimuli at this time.   Sleep: Fair  Appetite:  Fair  Past Psychiatric History: see h&P Family History: History reviewed. No pertinent family history. Social History:  Social History   Substance and Sexual Activity  Alcohol Use No     Social History   Substance and Sexual Activity  Drug Use Yes   Types: Marijuana   Comment: one year ago    Social  History   Socioeconomic History   Marital status: Single    Spouse name: Not on file   Number of children: Not on file   Years of education: Not on file   Highest education level: Not on file  Occupational History   Not on file  Tobacco Use   Smoking status: Never   Smokeless tobacco: Current    Types: Chew  Vaping Use   Vaping status: Never Used  Substance and Sexual Activity   Alcohol use: No   Drug use: Yes    Types: Marijuana    Comment: one year ago   Sexual activity: Not Currently  Other Topics Concern   Not on file  Social History Narrative   Not on file   Social Drivers of Health   Financial Resource Strain: Not on file  Food Insecurity: No Food Insecurity (10/13/2023)   Hunger Vital Sign    Worried About Running Out of Food in the Last Year: Never true    Ran Out of Food in the Last Year: Never true  Transportation Needs: No Transportation Needs (10/13/2023)   PRAPARE - Administrator, Civil Service (Medical): No    Lack of Transportation (Non-Medical): No  Physical Activity: Not on file  Stress: Not on file  Social Connections: Not on file   Past Medical History:  Past Medical History:  Diagnosis Date   Anxiety    Arthritis    Disorder of pineal gland    Post traumatic stress disorder    Schizoaffective disorder, bipolar type (HCC) 08/29/2023   Severe manic bipolar 1 disorder with psychotic behavior (HCC) 08/29/2023    Past Surgical History:  Procedure Laterality Date   ANKLE SURGERY      Current Medications: Current Facility-Administered Medications  Medication Dose Route Frequency Provider Last Rate Last Admin   acetaminophen  (TYLENOL ) tablet 650 mg  650 mg Oral Q6H PRN Coleman, Carolyn H, NP       alum & mag hydroxide-simeth (MAALOX/MYLANTA) 200-200-20 MG/5ML suspension 30 mL  30 mL Oral Q4H PRN Coleman, Carolyn H, NP       benztropine  (COGENTIN ) tablet 1 mg  1 mg Oral BID PRN Adelae Yodice E, PA-C       Or   benztropine   mesylate (COGENTIN ) injection 1 mg  1 mg Intramuscular BID PRN Kelcey Wickstrom E, PA-C       divalproex  (DEPAKOTE ) DR tablet 500 mg  500 mg Oral BID Hisako Bugh E, PA-C   500 mg at 10/17/23 9093   gabapentin  (NEURONTIN ) capsule 200 mg  200 mg Oral TID Macguire Holsinger E, PA-C   200 mg at 10/17/23 1203   haloperidol  (HALDOL ) tablet 10 mg  10 mg Oral QHS Hedda Crumbley E, PA-C       Or   haloperidol  lactate (HALDOL ) injection 10 mg  10 mg Intramuscular QHS Oona Trammel E, PA-C   10 mg at 10/16/23 2004   haloperidol  (HALDOL ) tablet 5 mg  5 mg Oral TID PRN Mardy Elveria DEL, NP  haloperidol  lactate (HALDOL ) injection 10 mg  10 mg Intramuscular TID PRN Nickoles Gregori E, PA-C   10 mg at 10/15/23 2050   magnesium  hydroxide (MILK OF MAGNESIA) suspension 30 mL  30 mL Oral Daily PRN Coleman, Carolyn H, NP       nicotine  (NICODERM CQ  - dosed in mg/24 hours) patch 14 mg  14 mg Transdermal Daily Jadapalle, Sree, MD   14 mg at 10/17/23 9093   nicotine  polacrilex (NICORETTE ) gum 2 mg  2 mg Oral PRN Jadapalle, Sree, MD   2 mg at 10/17/23 1236   propranolol  (INDERAL ) tablet 10 mg  10 mg Oral BID PRN Zaidan Keeble E, PA-C       traZODone  (DESYREL ) tablet 50 mg  50 mg Oral QHS PRN Coleman, Carolyn H, NP   50 mg at 10/16/23 2004    Lab Results:  No results found for this or any previous visit (from the past 48 hours).   Blood Alcohol level:  Lab Results  Component Value Date   [facility] <15 10/13/2023   ETH <15 08/15/2023    Metabolic Disorder Labs: Lab Results  Component Value Date   HGBA1C 4.9 05/17/2023   MPG 93.93 05/17/2023   MPG 93.93 09/29/2021   No results found for: PROLACTIN Lab Results  Component Value Date   CHOL 152 05/17/2023   TRIG 70 05/17/2023   HDL 78 05/17/2023   CHOLHDL 1.9 05/17/2023   VLDL 14 05/17/2023   LDLCALC 60 05/17/2023   LDLCALC 55 02/19/2020    Physical Findings: AIMS:  , ,  ,  ,    CIWA:    COWS:      Psychiatric  Specialty Exam:  Presentation  General Appearance:  Casual  Eye Contact: Fleeting  Speech: Normal Rate  Speech Volume: Normal    Mood and Affect  Mood: Labile  Affect: Congruent   Thought Process  Thought Processes: Coherent  Descriptions of Associations:Intact  Orientation:Full (Time, Place and Person)  Thought Content:Logical  Hallucinations:Hallucinations: None  Ideas of Reference:None  Suicidal Thoughts:Suicidal Thoughts: No  Homicidal Thoughts:Homicidal Thoughts: No   Sensorium  Memory: Immediate Fair  Judgment: Impaired  Insight: Lacking   Executive Functions  Concentration: Fair  Attention Span: Good  Recall: Good  Fund of Knowledge: Good  Language: Good   Psychomotor Activity  Psychomotor Activity: Psychomotor Activity: Restlessness  Musculoskeletal: Strength & Muscle Tone: within normal limits Gait & Station: normal Assets  Assets: Manufacturing systems engineer; Desire for Improvement; Vocational/Educational; Physical Health; Housing    Physical Exam: Physical Exam Vitals and nursing note reviewed.  HENT:     Head: Atraumatic.  Eyes:     Extraocular Movements: Extraocular movements intact.  Pulmonary:     Effort: Pulmonary effort is normal.  Neurological:     Mental Status: He is alert and oriented to person, place, and time.    Review of Systems  Psychiatric/Behavioral:  Negative for hallucinations, substance abuse and suicidal ideas.    Blood pressure 121/77, pulse 69, temperature 98.4 F (36.9 C), resp. rate 20, height 5' 11 (1.803 m), weight 77.1 kg, SpO2 99%. Body mass index is 23.71 kg/m.  Diagnosis: Principal Problem:   Schizoaffective disorder [facility])    Clinical Decision Making:   Treatment Plan Summary:    Safety and Monitoring:             -- Admission to inpatient psychiatric unit for safety, stabilization and treatment             --  Daily contact with patient to assess and evaluate symptoms  and progress in treatment             -- Patient's case to be discussed in multi-disciplinary team meeting             -- Observation Level: q15 minute checks             -- Vital signs:  q12 hours             -- Precautions: suicide, elopement, and assault   2. Psychiatric Diagnoses and Treatment:  Non emergent forced medication consult appreciated completed by Dr. Srivastava and Dr. Jadapalle              Schizoaffective disorder             -Depakote  500 mg twice daily for mood lability, level on 7/2 was 53             -- Continue gabapentin  100 mg 3 times daily for mood and anxiety, increased back to home dose of 200 mg TID             -- Continue Haldol  10 mg nightly will titrate-if refuses patient will receive IM Haldol  10 mg nightly as enforced medication approved by 2 psychiatrists.             -- Both allergy to Zyprexa  and Seroquel  are noted on chart review, however no evidence of a true allergic reaction.    -- The risks/benefits/side-effects/alternatives to this medication were discussed in detail with the patient and time was given for questions. The patient consents to medication trial.                -- Metabolic profile and EKG monitoring obtained while on an atypical antipsychotic (BMI: Lipid Panel: HbgA1c: QTc:)              -- Encouraged patient to participate in unit milieu and in scheduled group therapies                            3. Medical Issues Being Addressed:     no acute concerns  4. Discharge Planning:   -- Social work and case management to assist with discharge planning and identification of hospital follow-up needs prior to discharge  -- Estimated LOS: 3-4 days  Donnice FORBES Right, PA-C 10/17/2023, 2:58 PM

## 2023-10-17 NOTE — Progress Notes (Signed)
   10/17/23 1002  Psych Admission Type (Psych Patients Only)  Admission Status Voluntary  Psychosocial Assessment  Patient Complaints Sleep disturbance  Eye Contact Brief  Facial Expression Flat  Affect Flat  Speech Logical/coherent  Interaction Assertive  Motor Activity Slow  Appearance/Hygiene Unremarkable  Behavior Characteristics Cooperative  Mood Pleasant  Thought Process  Coherency WDL  Content WDL  Delusions None reported or observed  Perception WDL  Hallucination None reported or observed  Judgment Limited  Confusion None  Danger to Self  Current suicidal ideation? Denies  Self-Injurious Behavior No self-injurious ideation or behavior indicators observed or expressed   Agreement Not to Harm Self Yes  Description of Agreement verbal  Danger to Others  Danger to Others None reported or observed

## 2023-10-17 NOTE — Plan of Care (Signed)
   Problem: Education: Goal: Emotional status will improve Outcome: Progressing Goal: Mental status will improve Outcome: Progressing

## 2023-10-17 NOTE — Group Note (Signed)
 Recreation Therapy Group Note   Group Topic:Leisure Education  Group Date: 10/17/2023 Start Time: 1300 End Time: 1355 Facilitators: Celestia Jeoffrey BRAVO, LRT, CTRS Location: Craft Room  Group Description: Leisure. Patients were given the option to choose from singing karaoke, coloring mandalas, using oil pastels, journaling, or playing with play-doh. LRT and pts discussed the meaning of leisure, the importance of participating in leisure during their free time/when they're outside of the hospital, as well as how our leisure interests can also serve as coping skills.   Goal Area(s) Addressed:  Patient will identify a current leisure interest.  Patient will learn the definition of "leisure". Patient will practice making a positive decision. Patient will have the opportunity to try a new leisure activity. Patient will communicate with peers and LRT.    Affect/Mood: Appropriate   Participation Level: Active and Engaged   Participation Quality: Independent   Behavior: Appropriate, Calm, and Cooperative   Speech/Thought Process: Coherent   Insight: Good   Judgement: Good   Modes of Intervention: Clarification, Education, and Exploration   Patient Response to Interventions:  Attentive, Engaged, Interested , and Receptive   Education Outcome:  Acknowledges education   Clinical Observations/Individualized Feedback: [place] was active in their participation of session activities and group discussion. Pt identified workout and read poetry as things he does in his free time. Pt chose to paint while in group.    Plan: Continue to engage patient in RT group sessions 2-3x/week.   Jeoffrey BRAVO Celestia, LRT, CTRS 10/17/2023 2:44 PM

## 2023-10-17 NOTE — Group Note (Signed)
 Date:  10/17/2023 Time:  2:30 PM  Group Topic/Focus:  Healthy Communication:   The focus of this group is to discuss communication, barriers to communication, as well as healthy ways to communicate with others.    Participation Level:  Did Not Attend   Ronald [name] 10/17/2023, 2:30 PM

## 2023-10-18 DIAGNOSIS — F250 Schizoaffective disorder, bipolar type: Secondary | ICD-10-CM | POA: Diagnosis not present

## 2023-10-18 MED ORDER — DIVALPROEX SODIUM 500 MG PO DR TAB: 750.0000 mg | DELAYED_RELEASE_TABLET | Freq: Two times a day (BID) | ORAL | Status: DC

## 2023-10-18 MED ORDER — DIVALPROEX SODIUM ER 500 MG PO TB24
1000.0000 mg | ORAL_TABLET | Freq: Two times a day (BID) | ORAL | Status: DC
  Administered 2023-10-19 – 2023-10-22 (×7): 1000 mg via ORAL
  Filled 2023-10-18 (×7): qty 2

## 2023-10-18 MED ORDER — DIVALPROEX SODIUM 500 MG PO DR TAB
500.0000 mg | DELAYED_RELEASE_TABLET | Freq: Two times a day (BID) | ORAL | Status: DC
Start: 2023-10-18 — End: 2023-10-18
  Administered 2023-10-18: 500 mg via ORAL
  Filled 2023-10-18: qty 1

## 2023-10-18 MED ORDER — HALOPERIDOL 5 MG PO TABS
10.0000 mg | ORAL_TABLET | Freq: Every day | ORAL | Status: DC
  Administered 2023-10-18 – 2023-10-21 (×4): 10 mg via ORAL
  Filled 2023-10-18 (×4): qty 2

## 2023-10-18 MED ORDER — HALOPERIDOL LACTATE 5 MG/ML IJ SOLN: 10.0000 mg | Freq: Every day | INTRAMUSCULAR | Status: DC

## 2023-10-18 NOTE — Progress Notes (Signed)
   10/18/23 1000  Psych Admission Type (Psych Patients Only)  Admission Status Voluntary  Psychosocial Assessment  Patient Complaints Anxiety  Eye Contact Brief  Facial Expression Anxious  Affect Apprehensive;Irritable  Speech Logical/coherent  Interaction Assertive  Motor Activity Pacing  Appearance/Hygiene Unremarkable  Behavior Characteristics Cooperative  Mood Anxious;Pleasant  Thought Process  Coherency WDL  Content WDL  Delusions None reported or observed  Perception WDL  Hallucination None reported or observed  Judgment Impaired  Confusion None  Danger to Self  Current suicidal ideation? Denies  Self-Injurious Behavior No self-injurious ideation or behavior indicators observed or expressed   Agreement Not to Harm Self Yes  Description of Agreement Verbal  Danger to Others  Danger to Others None reported or observed   Patient anxious and little irritable this morning for getting medicine before breakfast. Patient refused to take any PRN for anxiety. No irritable behaviors noted later on today.

## 2023-10-18 NOTE — Group Note (Signed)
 Date:  10/18/2023 Time:  2:27 PM  Group Topic/Focus:  Dimensions of Wellness:   The focus of this group is to introduce the topic of wellness and discuss the role each dimension of wellness plays in total health.    Participation Level:  Active  Participation Quality:  Appropriate  Affect:  Appropriate  Cognitive:  Appropriate  Insight: Appropriate  Engagement in Group:  Engaged  Modes of Intervention:  Activity  Additional Comments:    Camellia HERO Areal Cochrane 10/18/2023, 2:27 PM

## 2023-10-18 NOTE — Progress Notes (Signed)
 Patient is alert and oriented x 4, he appears restless and  apprehensive and sometimes argumentative with Clinical research associate and requesting medication before the due time, he was redirected about medication scheduling and rules concerning it. Furthermore prior to this shift patient was upset because another peer on the unit was spading a rumor that he is a sexual offender. The accusation made him upset, angry anxious and tearful and he called his family members. Writer addressed this in the evening group wrap up group and advised group to avoid defamation of peers character and treat each other with respect. 15 minutes safety checks maintained will continue to monitor closely.

## 2023-10-18 NOTE — Group Note (Signed)
 Date:  10/18/2023 Time:  8:19 PM  Group Topic/Focus:  Activity Group: The focus of the group is to promote activity for the patients and encourage them to go outside to the courtyard and get some fresh air and exercise.    Participation Level:  Active  Participation Quality:  Appropriate  Affect:  Appropriate  Cognitive:  Appropriate  Insight: Appropriate  Engagement in Group:  Engaged  Modes of Intervention:  Activity  Additional Comments:    Camellia HERO Demonte Dobratz 10/18/2023, 8:19 PM

## 2023-10-18 NOTE — Plan of Care (Signed)
  Problem: Education: Goal: Knowledge of [place] [facility] Education information/materials will improve Outcome: Progressing   Problem: Education: Goal: Emotional status will improve Outcome: Progressing   Problem: Education: Goal: Knowledge of [facility] Education information/materials will improve Outcome: Progressing

## 2023-10-18 NOTE — Plan of Care (Signed)
  Problem: Education: Goal: Emotional status will improve 10/18/2023 0732 by Scarlet Jannett Humbles, RN Outcome: Progressing 10/18/2023 0731 by Scarlet Jannett Humbles, RN Outcome: Progressing   Problem: Education: Goal: Mental status will improve 10/18/2023 0732 by Scarlet Jannett Humbles, RN Outcome: Progressing 10/18/2023 0731 by Scarlet Jannett Humbles, RN Outcome: Progressing   Problem: Education: Goal: Verbalization of understanding the information provided will improve 10/18/2023 0732 by Scarlet Jannett Humbles, RN Outcome: Progressing 10/18/2023 0731 by Scarlet Jannett Humbles, RN Outcome: Progressing

## 2023-10-18 NOTE — Progress Notes (Cosign Needed Addendum)
 [facility] MD Progress Note  10/18/2023 4:15 PM Ronald [name]  MRN:  [id number] Patient is a 43 year old male who presented to the ED on 6/30 due to concerns about acute anxiety.  He has a psychiatric history of schizoaffective disorder bipolar type, GAD, PTSD, cannabis use disorder, and benzodiazepine use disorder.  There is no significant medical history reported.  Patient was recently discharged from [facility] on 09/02/2023.  He reports he did not follow-up following being discharged from the hospital and that he has been partially compliant with his medications reporting that he has been taking the Depakote    Subjective:  Chart reviewed, case discussed in multidisciplinary meeting, patient seen during rounds.   7/5: Patient seen for follow-up today.  They are apologetic for the behavior yesterday.  They note they were agitated because it hurt to be accused of being a pedophile.  Discussed that some people in the unit have a destructive behaviors and staff is working to address that.  Patient has been medication compliant and were able to tolerate tablet of Haldol  instead of requiring the injection.  After reading up on the long-acting injectable they request to proceed with the oral tablet.  They deny SI, HI, and AVH.  They still have some mood lability but are progressing well. Is able to affirm today the home dose is 1000 mg of depakote  er BID.   7/4: On interview patient is found in common area he is watching music videos with the other patients.  He is pleasant and cooperative.  On exam today he notes his anxiety is improved he feels he is tolerating the medications well he still has some paranoia about the oral Haldol  and indicates he would prefer to continue taking the IM injections nightly.  He denies SI, HI, and AVH.  He is more linear on exam today demonstrates proved insight into the need for ongoing medication management.  He is provided with the patient education on long-acting injectables and indicates  he will review and make a decision on whether or not he is willing to receive the long-acting injectable.  Discussed risk vs benefits and patient verbalized understanding.  Patient notes stable appetite and sleep.  They voiced no concerns or complaints at this time.  They received as needed trazodone  and Nicorette  overnight.  No medication adjustments continue to encourage patient to take oral medications.  7/3: On interview patient is found outside in the common area.  They are interacting with other patients and staff appropriately.  They apologized for their previous behavior during this admission indicate they are agreeable with taking Haldol  and Depakote  at this time.  They report they allowed for them to draw lab level yesterday.  They deny current SI, HI, and AVH.  They are still somewhat tangential but are easily redirectable today.  They note their anxiety has been improved.  They do not discuss Ativan  today.  They are pleasant and cooperative with exam.  Discussed LAI at discharge and they requested more information, will provide them with this tomorrow they are open to considering LAI.  7/2: On interview patient is noted to be resting in bed.  He remains fixated on needing Ativan  to help with the anxiety.  When asked about the reason for his visit he talks about watching TV and thinking about grandparents and having an anxiety attack which prompted his ED visit.  He reports Klonopin  was the only medication that helped him and he does not require any Haldol  or any other medications.  He talks about his patient rides stating that he will not be taking any other medications after discharge and he will take dose only during the hospitalization.  Dr. Kenn was on the phone through [employer] as a second physician to approve enforced medication.  Patient reports that oral medications do not do well for him and he prefers IM injections.  Provider and second physician Dr. Srivastava tried to educate  him on LAI which is an injectable form that he gets once in a month but patient exhibits concrete thought process and states he is going to stop all his medications on discharge and he is not going to take any LAI's.  He is not endorsing SI/HI and not responding to internal stimuli at this time.   Sleep: Fair  Appetite:  Fair  Past Psychiatric History: see h&P Family History: History reviewed. No pertinent family history. Social History:  Social History   Substance and Sexual Activity  Alcohol Use No     Social History   Substance and Sexual Activity  Drug Use Yes   Types: Marijuana   Comment: one year ago    Social History   Socioeconomic History   Marital status: Single    Spouse name: Not on file   Number of children: Not on file   Years of education: Not on file   Highest education level: Not on file  Occupational History   Not on file  Tobacco Use   Smoking status: Never   Smokeless tobacco: Current    Types: Chew  Vaping Use   Vaping status: Never Used  Substance and Sexual Activity   Alcohol use: No   Drug use: Yes    Types: Marijuana    Comment: one year ago   Sexual activity: Not Currently  Other Topics Concern   Not on file  Social History Narrative   Not on file   Social Drivers of Health   Financial Resource Strain: Not on file  Food Insecurity: No Food Insecurity (10/13/2023)   Hunger Vital Sign    Worried About Running Out of Food in the Last Year: Never true    Ran Out of Food in the Last Year: Never true  Transportation Needs: No Transportation Needs (10/13/2023)   PRAPARE - Administrator, Civil Service (Medical): No    Lack of Transportation (Non-Medical): No  Physical Activity: Not on file  Stress: Not on file  Social Connections: Not on file   Past Medical History:  Past Medical History:  Diagnosis Date   Anxiety    Arthritis    Disorder of pineal gland    Post traumatic stress disorder    Schizoaffective disorder,  bipolar type (HCC) 08/29/2023   Severe manic bipolar 1 disorder with psychotic behavior (HCC) 08/29/2023    Past Surgical History:  Procedure Laterality Date   ANKLE SURGERY      Current Medications: Current Facility-Administered Medications  Medication Dose Route Frequency Provider Last Rate Last Admin   acetaminophen  (TYLENOL ) tablet 650 mg  650 mg Oral Q6H PRN Coleman, Carolyn H, NP       alum & mag hydroxide-simeth (MAALOX/MYLANTA) 200-200-20 MG/5ML suspension 30 mL  30 mL Oral Q4H PRN Coleman, Carolyn H, NP       benztropine  (COGENTIN ) tablet 1 mg  1 mg Oral BID PRN Christean Silvestri E, PA-C       Or   benztropine  mesylate (COGENTIN ) injection 1 mg  1 mg Intramuscular BID PRN Kmari Brian  E, PA-C       divalproex  (DEPAKOTE ) DR tablet 500 mg  500 mg Oral BID Breanah Faddis E, PA-C       gabapentin  (NEURONTIN ) capsule 200 mg  200 mg Oral TID Zakayla Martinec E, PA-C   200 mg at 10/18/23 1220   haloperidol  (HALDOL ) tablet 10 mg  10 mg Oral QHS Netra Postlethwait E, PA-C       Or   haloperidol  lactate (HALDOL ) injection 10 mg  10 mg Intramuscular QHS Zalen Sequeira E, PA-C       haloperidol  (HALDOL ) tablet 5 mg  5 mg Oral TID PRN Mardy Elveria DEL, NP       haloperidol  lactate (HALDOL ) injection 10 mg  10 mg Intramuscular TID PRN Hasani Diemer E, PA-C   10 mg at 10/15/23 2050   magnesium  hydroxide (MILK OF MAGNESIA) suspension 30 mL  30 mL Oral Daily PRN Mardy Elveria DEL, NP       nicotine  (NICODERM CQ  - dosed in mg/24 hours) patch 14 mg  14 mg Transdermal Daily Jadapalle, Sree, MD   14 mg at 10/18/23 9256   nicotine  polacrilex (NICORETTE ) gum 2 mg  2 mg Oral PRN Jadapalle, Sree, MD   2 mg at 10/18/23 1509   propranolol  (INDERAL ) tablet 10 mg  10 mg Oral BID PRN Dameon Soltis E, PA-C       traZODone  (DESYREL ) tablet 50 mg  50 mg Oral QHS PRN Coleman, Carolyn H, NP   50 mg at 10/17/23 2009    Lab Results:  No results found for this or any previous  visit (from the past 48 hours).   Blood Alcohol level:  Lab Results  Component Value Date   [facility] <15 10/13/2023   ETH <15 08/15/2023    Metabolic Disorder Labs: Lab Results  Component Value Date   HGBA1C 4.9 05/17/2023   MPG 93.93 05/17/2023   MPG 93.93 09/29/2021   No results found for: PROLACTIN Lab Results  Component Value Date   CHOL 152 05/17/2023   TRIG 70 05/17/2023   HDL 78 05/17/2023   CHOLHDL 1.9 05/17/2023   VLDL 14 05/17/2023   LDLCALC 60 05/17/2023   LDLCALC 55 02/19/2020    Physical Findings: AIMS:  , ,  ,  ,    CIWA:    COWS:      Psychiatric Specialty Exam:  Presentation  General Appearance:  Casual  Eye Contact: Fair  Speech: Clear and Coherent  Speech Volume: Normal    Mood and Affect  Mood: Euthymic  Affect: Congruent   Thought Process  Thought Processes: Coherent  Descriptions of Associations:Intact  Orientation:Full (Time, Place and Person)  Thought Content:Logical  Hallucinations:Hallucinations: None  Ideas of Reference:None  Suicidal Thoughts:Suicidal Thoughts: No  Homicidal Thoughts:Homicidal Thoughts: No   Sensorium  Memory: Immediate Fair; Recent Fair  Judgment: Impaired  Insight: Fair   Chartered certified accountant: Fair  Attention Span: Good  Recall: Good  Fund of Knowledge: Good  Language: Good   Psychomotor Activity  Psychomotor Activity: Psychomotor Activity: Normal  Musculoskeletal: Strength & Muscle Tone: within normal limits Gait & Station: normal Assets  Assets: Manufacturing systems engineer; Desire for Improvement    Physical Exam: Physical Exam Vitals and nursing note reviewed.  HENT:     Head: Atraumatic.  Eyes:     Extraocular Movements: Extraocular movements intact.  Pulmonary:     Effort: Pulmonary effort is normal.  Neurological:     Mental Status: He is alert and oriented  to person, place, and time.    Review of Systems  Psychiatric/Behavioral:   Negative for hallucinations, substance abuse and suicidal ideas.    Blood pressure 104/70, pulse 63, temperature 98.2 F (36.8 C), resp. rate 20, height 5' 11 (1.803 m), weight 77.1 kg, SpO2 96%. Body mass index is 23.71 kg/m.  Diagnosis: Principal Problem:   Schizoaffective disorder [facility])    Clinical Decision Making:   Treatment Plan Summary:    Safety and Monitoring:             -- Admission to inpatient psychiatric unit for safety, stabilization and treatment             -- Daily contact with patient to assess and evaluate symptoms and progress in treatment             -- Patient's case to be discussed in multi-disciplinary team meeting             -- Observation Level: q15 minute checks             -- Vital signs:  q12 hours             -- Precautions: suicide, elopement, and assault   2. Psychiatric Diagnoses and Treatment:  Non emergent forced medication consult appreciated completed by Dr. Kenn and Dr. Donnelly              Schizoaffective disorder             - Increase back to home dose of 1000 mg of Depakote  ER twice daily, level on 7/2 was 53             -- Continue gabapentin  100 mg 3 times daily for mood and anxiety, increased back to home dose of 200 mg TID             -- Continue Haldol  10 mg nightly will titrate-if refuses patient will receive IM Haldol  10 mg nightly as enforced medication approved by 2 psychiatrists.             -- Both allergy to Zyprexa  and Seroquel  are noted on chart review, however no evidence of a true allergic reaction.    -- The risks/benefits/side-effects/alternatives to this medication were discussed in detail with the patient and time was given for questions. The patient consents to medication trial.                -- Metabolic profile and EKG monitoring obtained while on an atypical antipsychotic (BMI: Lipid Panel: HbgA1c: QTc:)              -- Encouraged patient to participate in unit milieu and in scheduled group therapies                             3. Medical Issues Being Addressed:     no acute concerns  4. Discharge Planning:   -- Social work and case management to assist with discharge planning and identification of hospital follow-up needs prior to discharge  -- Estimated LOS: 3-4 days  Donnice FORBES Right, PA-C 10/18/2023, 4:15 PM

## 2023-10-18 NOTE — Plan of Care (Signed)
  Problem: Education: Goal: Knowledge of [facility] Education information/materials will improve Outcome: Progressing Goal: Mental status will improve Outcome: Progressing   Problem: Activity: Goal: Interest or engagement in activities will improve Outcome: Progressing   Problem: Health Behavior/Discharge Planning: Goal: Identification of resources available to assist in meeting health care needs will improve Outcome: Progressing   Problem: Physical Regulation: Goal: Ability to maintain clinical measurements within normal limits will improve Outcome: Progressing   Problem: Safety: Goal: Periods of time without injury will increase Outcome: Progressing   Problem: Health Behavior/Discharge Planning: Goal: Ability to manage health-related needs will improve Outcome: Progressing   Problem: Clinical Measurements: Goal: Ability to maintain clinical measurements within normal limits will improve Outcome: Progressing Goal: Respiratory complications will improve Outcome: Progressing

## 2023-10-19 DIAGNOSIS — F250 Schizoaffective disorder, bipolar type: Secondary | ICD-10-CM | POA: Diagnosis not present

## 2023-10-19 NOTE — Progress Notes (Signed)
 [facility] MD Progress Note  10/19/2023 1:25 PM DAXTER PAULE  MRN:  [id number] Patient is a 43 year old male who presented to the ED on 6/30 due to concerns about acute anxiety.  He has a psychiatric history of schizoaffective disorder bipolar type, GAD, PTSD, cannabis use disorder, and benzodiazepine use disorder.  There is no significant medical history reported.  Patient was recently discharged from [facility] on 09/02/2023.  He reports he did not follow-up following being discharged from the hospital and that he has been partially compliant with his medications reporting that he has been taking the Depakote    Subjective:  Chart reviewed, case discussed in multidisciplinary meeting, patient seen during rounds.   7/6: Patient seen this morning for follow-up he is irritable and emotionally labile he continues to have conflict with another patient.  He required as needed medications due to agitation.  Discussed adding morning dose of Haldol  given ongoing mood lability.  Patient was not agreeable to this at that time however he did not end up receiving the as needed dose of Haldol .  He denied SI, HI and AVH prior to the as needed dose.  He was more linear but easily agitated.  He is frustrated as another patient continues to call him a pedophile, discussed utilizing coping skills to mitigate this stressors, and that we would revisit further medication adjustments.  7/5: Patient seen for follow-up today.  They are apologetic for the behavior yesterday.  They note they were agitated because it hurt to be accused of being a pedophile.  Discussed that some people in the unit have a destructive behaviors and staff is working to address that.  Patient has been medication compliant and were able to tolerate tablet of Haldol  instead of requiring the injection.  After reading up on the long-acting injectable they request to proceed with the oral tablet.  They deny SI, HI, and AVH.  They still have some mood lability but are  progressing well. Is able to affirm today the home dose is 1000 mg of depakote  er BID.   7/4: On interview patient is found in common area he is watching music videos with the other patients.  He is pleasant and cooperative.  On exam today he notes his anxiety is improved he feels he is tolerating the medications well he still has some paranoia about the oral Haldol  and indicates he would prefer to continue taking the IM injections nightly.  He denies SI, HI, and AVH.  He is more linear on exam today demonstrates proved insight into the need for ongoing medication management.  He is provided with the patient education on long-acting injectables and indicates he will review and make a decision on whether or not he is willing to receive the long-acting injectable.  Discussed risk vs benefits and patient verbalized understanding.  Patient notes stable appetite and sleep.  They voiced no concerns or complaints at this time.  They received as needed trazodone  and Nicorette  overnight.  No medication adjustments continue to encourage patient to take oral medications.  7/3: On interview patient is found outside in the common area.  They are interacting with other patients and staff appropriately.  They apologized for their previous behavior during this admission indicate they are agreeable with taking Haldol  and Depakote  at this time.  They report they allowed for them to draw lab level yesterday.  They deny current SI, HI, and AVH.  They are still somewhat tangential but are easily redirectable today.  They note their anxiety  has been improved.  They do not discuss Ativan  today.  They are pleasant and cooperative with exam.  Discussed LAI at discharge and they requested more information, will provide them with this tomorrow they are open to considering LAI.  7/2: On interview patient is noted to be resting in bed.  He remains fixated on needing Ativan  to help with the anxiety.  When asked about the reason for his  visit he talks about watching TV and thinking about grandparents and having an anxiety attack which prompted his ED visit.  He reports Klonopin  was the only medication that helped him and he does not require any Haldol  or any other medications.  He talks about his patient rides stating that he will not be taking any other medications after discharge and he will take dose only during the hospitalization.  Dr. Kenn was on the phone through [employer] teams as a second physician to approve enforced medication.  Patient reports that oral medications do not do well for him and he prefers IM injections.  Provider and second physician Dr. Srivastava tried to educate him on LAI which is an injectable form that he gets once in a month but patient exhibits concrete thought process and states he is going to stop all his medications on discharge and he is not going to take any LAI's.  He is not endorsing SI/HI and not responding to internal stimuli at this time.   Sleep: Fair  Appetite:  Fair  Past Psychiatric History: see h&P Family History: History reviewed. No pertinent family history. Social History:  Social History   Substance and Sexual Activity  Alcohol Use No     Social History   Substance and Sexual Activity  Drug Use Yes   Types: Marijuana   Comment: one year ago    Social History   Socioeconomic History   Marital status: Single    Spouse name: Not on file   Number of children: Not on file   Years of education: Not on file   Highest education level: Not on file  Occupational History   Not on file  Tobacco Use   Smoking status: Never   Smokeless tobacco: Current    Types: Chew  Vaping Use   Vaping status: Never Used  Substance and Sexual Activity   Alcohol use: No   Drug use: Yes    Types: Marijuana    Comment: one year ago   Sexual activity: Not Currently  Other Topics Concern   Not on file  Social History Narrative   Not on file   Social Drivers of Health    Financial Resource Strain: Not on file  Food Insecurity: No Food Insecurity (10/13/2023)   Hunger Vital Sign    Worried About Running Out of Food in the Last Year: Never true    Ran Out of Food in the Last Year: Never true  Transportation Needs: No Transportation Needs (10/13/2023)   PRAPARE - Administrator, Civil Service (Medical): No    Lack of Transportation (Non-Medical): No  Physical Activity: Not on file  Stress: Not on file  Social Connections: Not on file   Past Medical History:  Past Medical History:  Diagnosis Date   Anxiety    Arthritis    Disorder of pineal gland    Post traumatic stress disorder    Schizoaffective disorder, bipolar type (HCC) 08/29/2023   Severe manic bipolar 1 disorder with psychotic behavior (HCC) 08/29/2023    Past Surgical History:  Procedure Laterality Date   ANKLE SURGERY      Current Medications: Current Facility-Administered Medications  Medication Dose Route Frequency Provider Last Rate Last Admin   acetaminophen  (TYLENOL ) tablet 650 mg  650 mg Oral Q6H PRN Coleman, Carolyn H, NP   650 mg at 10/19/23 0824   alum & mag hydroxide-simeth (MAALOX/MYLANTA) 200-200-20 MG/5ML suspension 30 mL  30 mL Oral Q4H PRN Coleman, Carolyn H, NP       benztropine  (COGENTIN ) tablet 1 mg  1 mg Oral BID PRN Rafe Mackowski E, PA-C   1 mg at 10/18/23 2028   Or   benztropine  mesylate (COGENTIN ) injection 1 mg  1 mg Intramuscular BID PRN Marcus Schwandt E, PA-C       divalproex  (DEPAKOTE  ER) 24 hr tablet 1,000 mg  1,000 mg Oral BID Anetra Czerwinski E, PA-C   1,000 mg at 10/19/23 0820   gabapentin  (NEURONTIN ) capsule 200 mg  200 mg Oral TID Tuan Tippin E, PA-C   200 mg at 10/19/23 1216   haloperidol  (HALDOL ) tablet 10 mg  10 mg Oral QHS Aileena Iglesia E, PA-C   10 mg at 10/18/23 2028   Or   haloperidol  lactate (HALDOL ) injection 10 mg  10 mg Intramuscular QHS Kitt Minardi E, PA-C       haloperidol  (HALDOL ) tablet 5 mg   5 mg Oral TID PRN Mardy Elveria DEL, NP       haloperidol  lactate (HALDOL ) injection 10 mg  10 mg Intramuscular TID PRN Priyanka Causey E, PA-C   10 mg at 10/19/23 1041   magnesium  hydroxide (MILK OF MAGNESIA) suspension 30 mL  30 mL Oral Daily PRN Coleman, Carolyn H, NP       nicotine  (NICODERM CQ  - dosed in mg/24 hours) patch 14 mg  14 mg Transdermal Daily Jadapalle, Sree, MD   14 mg at 10/19/23 9178   nicotine  polacrilex (NICORETTE ) gum 2 mg  2 mg Oral PRN Jadapalle, Sree, MD   2 mg at 10/19/23 1248   propranolol  (INDERAL ) tablet 10 mg  10 mg Oral BID PRN Vondell Babers E, PA-C   10 mg at 10/18/23 2027   traZODone  (DESYREL ) tablet 50 mg  50 mg Oral QHS PRN Coleman, Carolyn H, NP   50 mg at 10/18/23 2029    Lab Results:  No results found for this or any previous visit (from the past 48 hours).   Blood Alcohol level:  Lab Results  Component Value Date   [facility] <15 10/13/2023   ETH <15 08/15/2023    Metabolic Disorder Labs: Lab Results  Component Value Date   HGBA1C 4.9 05/17/2023   MPG 93.93 05/17/2023   MPG 93.93 09/29/2021   No results found for: PROLACTIN Lab Results  Component Value Date   CHOL 152 05/17/2023   TRIG 70 05/17/2023   HDL 78 05/17/2023   CHOLHDL 1.9 05/17/2023   VLDL 14 05/17/2023   LDLCALC 60 05/17/2023   LDLCALC 55 02/19/2020    Physical Findings: AIMS:  , ,  ,  ,    CIWA:    COWS:      Psychiatric Specialty Exam:  Presentation  General Appearance:  Casual  Eye Contact: Fair  Speech: Pressured  Speech Volume: Increased    Mood and Affect  Mood: Labile  Affect: Labile   Thought Process  Thought Processes: Linear  Descriptions of Associations:Intact  Orientation:Full (Time, Place and Person)  Thought Content:WDL  Hallucinations:Hallucinations: None  Ideas of Reference:None  Suicidal Thoughts:Suicidal Thoughts:  No  Homicidal Thoughts:Homicidal Thoughts: No   Sensorium  Memory: Immediate  Fair  Judgment: Poor  Insight: Poor   Executive Functions  Concentration: Fair  Attention Span: Fair  Recall: Good  Fund of Knowledge: Good  Language: Good   Psychomotor Activity  Psychomotor Activity: Psychomotor Activity: Restlessness  Musculoskeletal: Strength & Muscle Tone: within normal limits Gait & Station: normal Assets  Assets: Manufacturing systems engineer; Desire for Improvement; Housing    Physical Exam: Physical Exam Vitals and nursing note reviewed.  HENT:     Head: Atraumatic.  Eyes:     Extraocular Movements: Extraocular movements intact.  Pulmonary:     Effort: Pulmonary effort is normal.  Neurological:     Mental Status: He is alert and oriented to person, place, and time.    Review of Systems  Psychiatric/Behavioral:  Negative for hallucinations, substance abuse and suicidal ideas.    Blood pressure 111/65, pulse 62, temperature (!) 97.2 F (36.2 C), resp. rate 15, height 5' 11 (1.803 m), weight 77.1 kg, SpO2 99%. Body mass index is 23.71 kg/m.  Diagnosis: Principal Problem:   Schizoaffective disorder [facility])    Clinical Decision Making:   Treatment Plan Summary:    Safety and Monitoring:             -- Admission to inpatient psychiatric unit for safety, stabilization and treatment             -- Daily contact with patient to assess and evaluate symptoms and progress in treatment             -- Patient's case to be discussed in multi-disciplinary team meeting             -- Observation Level: q15 minute checks             -- Vital signs:  q12 hours             -- Precautions: suicide, elopement, and assault   2. Psychiatric Diagnoses and Treatment:  Non emergent forced medication consult appreciated completed by Dr. Kenn and Dr. Donnelly              Schizoaffective disorder             - Continue 1000 mg of Depakote  ER twice daily, level on 7/2 was 53             -- Continue gabapentin  100 mg 3 times daily for mood and  anxiety, increased back to home dose of 200 mg TID             -- Continue Haldol  10 mg nightly will titrate-if refuses patient will receive IM Haldol  10 mg nightly as enforced medication approved by 2 psychiatrists.             -- Both allergy to Zyprexa  and Seroquel  are noted on chart review, however no evidence of a true allergic reaction.    -- The risks/benefits/side-effects/alternatives to this medication were discussed in detail with the patient and time was given for questions. The patient consents to medication trial.                -- Metabolic profile and EKG monitoring obtained while on an atypical antipsychotic (BMI: Lipid Panel: HbgA1c: QTc:)              -- Encouraged patient to participate in unit milieu and in scheduled group therapies  3. Medical Issues Being Addressed:     no acute concerns  4. Discharge Planning:   --  Discharge Late week  -- Social work and case management to assist with discharge planning and identification of hospital follow-up needs prior to discharge  -- Estimated LOS: 3-4 days  Donnice FORBES Right, PA-C 10/19/2023, 1:25 PM

## 2023-10-19 NOTE — Group Note (Signed)
 LCSW Group Therapy Note   Group Date: 10/19/2023 Start Time: 1300 End Time: 1400   Type of Therapy and Topic:  Group Therapy: Boundaries  Participation Level:  Did Not Attend  Description of Group: This group will address the use of boundaries in their personal lives. Patients will explore why boundaries are important, the difference between healthy and unhealthy boundaries, and negative and postive outcomes of different boundaries and will look at how boundaries can be crossed.  Patients will be encouraged to identify current boundaries in their own lives and identify what kind of boundary is being set. Facilitators will guide patients in utilizing problem-solving interventions to address and correct types boundaries being used and to address when no boundary is being used. Understanding and applying boundaries will be explored and addressed for obtaining and maintaining a balanced life. Patients will be encouraged to explore ways to assertively make their boundaries and needs known to significant others in their lives, using other group members and facilitator for role play, support, and feedback.  Therapeutic Goals:  1.  Patient will identify areas in their life where setting clear boundaries could be  used to improve their life.  2.  Patient will identify signs/triggers that a boundary is not being respected. 3.  Patient will identify two ways to set boundaries in order to achieve balance in  their lives: 4.  Patient will demonstrate ability to communicate their needs and set boundaries  through discussion and/or role plays  Summary of Patient Progress: Patient did not attend.    Therapeutic Modalities:   Cognitive Behavioral Therapy Solution-Focused Therapy  Roselyn GORMAN Lento, LCSWA 10/19/2023  4:16 PM

## 2023-10-19 NOTE — Plan of Care (Signed)
  Problem: Education: Goal: Knowledge of [facility] Education information/materials will improve Outcome: Not Progressing Goal: Emotional status will improve Outcome: Not Progressing Goal: Mental status will improve Outcome: Not Progressing Goal: Verbalization of understanding the information provided will improve Outcome: Not Progressing   Problem: Activity: Goal: Interest or engagement in activities will improve Outcome: Not Progressing Goal: Sleeping patterns will improve Outcome: Not Progressing   Problem: Coping: Goal: Ability to verbalize frustrations and anger appropriately will improve Outcome: Not Progressing Goal: Ability to demonstrate self-control will improve Outcome: Not Progressing   Problem: Health Behavior/Discharge Planning: Goal: Identification of resources available to assist in meeting health care needs will improve Outcome: Not Progressing Goal: Compliance with treatment plan for underlying cause of condition will improve Outcome: Not Progressing   Problem: Physical Regulation: Goal: Ability to maintain clinical measurements within normal limits will improve Outcome: Not Progressing   Problem: Safety: Goal: Periods of time without injury will increase Outcome: Not Progressing   Problem: Education: Goal: Knowledge of General Education information will improve Description: Including pain rating scale, medication(s)/side effects and non-pharmacologic comfort measures Outcome: Not Progressing   Problem: Health Behavior/Discharge Planning: Goal: Ability to manage health-related needs will improve Outcome: Not Progressing   Problem: Clinical Measurements: Goal: Ability to maintain clinical measurements within normal limits will improve Outcome: Not Progressing Goal: Will remain free from infection Outcome: Not Progressing Goal: Diagnostic test results will improve Outcome: Not Progressing Goal: Respiratory complications will  improve Outcome: Not Progressing Goal: Cardiovascular complication will be avoided Outcome: Not Progressing   Problem: Activity: Goal: Risk for activity intolerance will decrease Outcome: Not Progressing   Problem: Nutrition: Goal: Adequate nutrition will be maintained Outcome: Not Progressing   Problem: Coping: Goal: Level of anxiety will decrease Outcome: Not Progressing   Problem: Elimination: Goal: Will not experience complications related to bowel motility Outcome: Not Progressing Goal: Will not experience complications related to urinary retention Outcome: Not Progressing   Problem: Pain Managment: Goal: General experience of comfort will improve and/or be controlled Outcome: Not Progressing   Problem: Safety: Goal: Ability to remain free from injury will improve Outcome: Not Progressing   Problem: Skin Integrity: Goal: Risk for impaired skin integrity will decrease Outcome: Not Progressing

## 2023-10-19 NOTE — Plan of Care (Signed)
  Problem: Education: Goal: Emotional status will improve Outcome: Progressing   Problem: Education: Goal: Mental status will improve Outcome: Progressing   Problem: Education: Goal: Verbalization of understanding the information provided will improve Outcome: Progressing   

## 2023-10-19 NOTE — Progress Notes (Signed)
   10/18/23 2000  Psych Admission Type (Psych Patients Only)  Admission Status Voluntary  Psychosocial Assessment  Patient Complaints Anxiety  Eye Contact Brief  Facial Expression Flat  Affect Apprehensive;Irritable  Speech Logical/coherent  Interaction Assertive  Motor Activity Restless  Appearance/Hygiene Improved  Behavior Characteristics Cooperative;Appropriate to situation  Mood Angry;Irritable  Thought Process  Coherency WDL  Content WDL  Delusions None reported or observed  Perception WDL  Hallucination None reported or observed  Judgment Limited  Confusion None  Danger to Self  Current suicidal ideation? Denies  Self-Injurious Behavior No self-injurious ideation or behavior indicators observed or expressed   Agreement Not to Harm Self Yes  Description of Agreement verbal  Danger to Others  Danger to Others None reported or observed   Patient is alert and oriented x 4, affect is blunted thoughts are disorganized, he was agitated, confrontation, and argumentation, hostile and verbally aggressive towards staff, demanding for medications before due time. Patient ws redirected and verbally deescalated but he was not receptive. Security was called and patent was escorted to his room.

## 2023-10-19 NOTE — Group Note (Signed)
 Date:  10/19/2023 Time:  3:47 AM  Group Topic/Focus:  Building Self Esteem:   The Focus of this group is helping patients become aware of the effects of self-esteem on their lives, the things they and others do that enhance or undermine their self-esteem, seeing the relationship between their level of self-esteem and the choices they make and learning ways to enhance self-esteem. Wrap-Up Group:   The focus of this group is to help patients review their daily goal of treatment and discuss progress on daily workbooks.    Participation Level:  Active  Participation Quality:  Appropriate and Attentive  Affect:  Appropriate  Cognitive:  Appropriate and Oriented  Insight: Appropriate  Engagement in Group:  Engaged  Modes of Intervention:  Discussion and Support  Additional Comments:  N/A  Butler LITTIE Gelineau 10/19/2023, 3:47 AM

## 2023-10-19 NOTE — Group Note (Signed)
 Date:  10/19/2023 Time:  10:17 AM  Group Topic/Focus:  Rediscovering Joy:   The focus of this group is to explore various ways to relieve stress in a positive manner.    Participation Level:  Active  Participation Quality:  Appropriate  Affect:  Appropriate  Cognitive:  Appropriate  Insight: Appropriate  Engagement in Group:  Engaged  Modes of Intervention:  Activity  Additional Comments:    Ronald [name] 10/19/2023, 10:17 AM

## 2023-10-19 NOTE — Progress Notes (Addendum)
   10/19/23 1041  Complaints & Interventions  Complains of Agitation;Anxiety  Interventions Medication (see MAR) (Haldol )   Staff was informed by another patient on the unit that this patient, along with another male patient were going back and forth with each other interrupting group. Both patients were asked to leave group and to go to their rooms. Patient was given IM PRN Haldol  to help with agitation and anxiety. Patient tolerated medication well, without any issues. Patient remains safe on the unit. Patient will continue to be monitored.

## 2023-10-19 NOTE — Progress Notes (Signed)
   10/19/23 1100  Psych Admission Type (Psych Patients Only)  Admission Status Voluntary  Psychosocial Assessment  Patient Complaints Anxiety (patient reports that his anxiety is high due to the environment; patient was also freaking out during morning med pass, thinking that he was given too much Depakote .)  Eye Contact Fair;Watchful  Facial Expression Anxious;Worried  Affect Anxious;Labile (labile at times)  Speech Logical/coherent;Loud (loud at times)  Interaction Assertive  Motor Activity Slow  Appearance/Hygiene In scrubs;Body odor  Behavior Characteristics Cooperative;Appropriate to situation  Mood Preoccupied;Labile  Aggressive Behavior  Effect No apparent injury  Thought Process  Coherency WDL  Content Preoccupation  Delusions None reported or observed  Perception WDL  Hallucination None reported or observed  Judgment Impaired  Confusion None  Danger to Self  Current suicidal ideation? Denies  Self-Injurious Behavior No self-injurious ideation or behavior indicators observed or expressed   Agreement Not to Harm Self Yes  Description of Agreement Verbal  Danger to Others  Danger to Others None reported or observed   Patient's goal for today, per his self-inventory is medication regulation, coping skills, attending groups. The reason I am here is because I ran out of Gabapentin  and Haldol  at my house, in which he will Take medication and attend groups in order to achieve his goal.

## 2023-10-20 DIAGNOSIS — F259 Schizoaffective disorder, unspecified: Secondary | ICD-10-CM

## 2023-10-20 NOTE — Group Note (Signed)
 Recreation Therapy Group Note   Group Topic:Health and Wellness  Group Date: 10/20/2023 Start Time: 1050 End Time: 1130 Facilitators: Celestia Jeoffrey BRAVO, LRT, CTRS Location: Courtyard  Group Description: [employer]. LRT and patients played games of basketball, drew with chalk, and played corn hole while outside in the courtyard while getting fresh air and sunlight. Music was being played in the background. LRT and peers conversed about different games they have played before, what they do in their free time and anything else that is on their minds. LRT encouraged pts to drink water  after being outside, sweating and getting their heart rate up.  Goal Area(s) Addressed: Patient will build on frustration tolerance skills. Patients will partake in a competitive play game with peers. Patients will gain knowledge of new leisure interest/hobby.    Affect/Mood: Appropriate   Participation Level: Active and Engaged   Participation Quality: Independent   Behavior: Appropriate, Calm, and Cooperative   Speech/Thought Process: Coherent   Insight: Good   Judgement: Good   Modes of Intervention: Activity, Exploration, and Socialization   Patient Response to Interventions:  Attentive, Engaged, Interested , and Receptive   Education Outcome:  Acknowledges education   Clinical Observations/Individualized Feedback: Joaovictor was active in their participation of session activities and group discussion. Pt chose to exercise and do yoga while outside. Pt interacted well with LRT and peers duration of session.    Plan: Continue to engage patient in RT group sessions 2-3x/week.   Jeoffrey BRAVO Celestia, LRT, CTRS 10/20/2023 12:48 PM

## 2023-10-20 NOTE — Group Note (Signed)
 Date:  10/20/2023 Time:  9:14 PM  Group Topic/Focus:  Wrap-Up Group:   The focus of this group is to help patients review their daily goal of treatment and discuss progress on daily workbooks.    Participation Level:  Active  Participation Quality:  Appropriate and Attentive  Affect:  Appropriate  Cognitive:  Alert  Insight: Appropriate  Engagement in Group:  Engaged  Modes of Intervention:  Discussion  Additional Comments:     Maglione,Davey Bergsma E 10/20/2023, 9:14 PM

## 2023-10-20 NOTE — Group Note (Signed)
 [facility] LCSW Group Therapy Note    Group Date: 10/20/2023 Start Time: 1300 End Time: 1345  Type of Therapy and Topic:  Group Therapy:  Overcoming Obstacles  Participation Level:  [facility] PARTICIPATION LEVEL: Active   Description of Group:   In this group patients will be encouraged to explore what they see as obstacles to their own wellness and recovery. They will be guided to discuss their thoughts, feelings, and behaviors related to these obstacles. The group will process together ways to cope with barriers, with attention given to specific choices patients can make. Each patient will be challenged to identify changes they are motivated to make in order to overcome their obstacles. This group will be process-oriented, with patients participating in exploration of their own experiences as well as giving and receiving support and challenge from other group members.  Therapeutic Goals: 1. Patient will identify personal and current obstacles as they relate to admission. 2. Patient will identify barriers that currently interfere with their wellness or overcoming obstacles.  3. Patient will identify feelings, thought process and behaviors related to these barriers. 4. Patient will identify two changes they are willing to make to overcome these obstacles:    Summary of Patient Progress Patient was present for the entirety of the group process. He was actively engaged in the discussion and behavior was appropriate. Pt appeared open and receptive to feedback/comments from both his peers and the facilitator.    Therapeutic Modalities:   Cognitive Behavioral Therapy Solution Focused Therapy Motivational Interviewing Relapse Prevention Therapy   Nadara JONELLE Fam, LCSW

## 2023-10-20 NOTE — Progress Notes (Signed)
   10/20/23 0900  Psych Admission Type (Psych Patients Only)  Admission Status Voluntary  Psychosocial Assessment  Patient Complaints None  Eye Contact Fair;Watchful  Facial Expression Anxious  Affect Appropriate to circumstance  Speech Logical/coherent  Interaction Assertive  Motor Activity Slow  Appearance/Hygiene In scrubs  Behavior Characteristics Cooperative;Appropriate to situation  Mood Pleasant  Aggressive Behavior  Effect No apparent injury  Thought Process  Coherency WDL  Content WDL  Delusions None reported or observed  Perception WDL  Hallucination None reported or observed  Judgment WDL  Confusion None  Danger to Self  Current suicidal ideation? Denies  Self-Injurious Behavior No self-injurious ideation or behavior indicators observed or expressed   Agreement Not to Harm Self Yes  Description of Agreement Verbal  Danger to Others  Danger to Others None reported or observed   Patient's goal for today, per his self-inventory is medication regulation, attend groups in which he will take medication attend groups in order to achieve his goal. Patient also wanted to thank the staff.

## 2023-10-20 NOTE — Plan of Care (Signed)
  Problem: Education: Goal: Knowledge of [place] Education information/materials will improve Outcome: Progressing Goal: Emotional status will improve Outcome: Progressing Goal: Mental status will improve Outcome: Progressing Goal: Verbalization of understanding the information provided will improve Outcome: Progressing   Problem: Activity: Goal: Interest or engagement in activities will improve Outcome: Progressing Goal: Sleeping patterns will improve Outcome: Progressing   Problem: Coping: Goal: Ability to verbalize frustrations and anger appropriately will improve Outcome: Progressing Goal: Ability to demonstrate self-control will improve Outcome: Progressing   Problem: Health Behavior/Discharge Planning: Goal: Identification of resources available to assist in meeting health care needs will improve Outcome: Progressing Goal: Compliance with treatment plan for underlying cause of condition will improve Outcome: Progressing   Problem: Physical Regulation: Goal: Ability to maintain clinical measurements within normal limits will improve Outcome: Progressing   Problem: Safety: Goal: Periods of time without injury will increase Outcome: Progressing   Problem: Education: Goal: Knowledge of General Education information will improve Description: Including pain rating scale, medication(s)/side effects and non-pharmacologic comfort measures Outcome: Progressing   Problem: Health Behavior/Discharge Planning: Goal: Ability to manage health-related needs will improve Outcome: Progressing   Problem: Clinical Measurements: Goal: Ability to maintain clinical measurements within normal limits will improve Outcome: Progressing Goal: Will remain free from infection Outcome: Progressing Goal: Diagnostic test results will improve Outcome: Progressing Goal: Respiratory complications will improve Outcome: Progressing Goal: Cardiovascular complication will be  avoided Outcome: Progressing   Problem: Activity: Goal: Risk for activity intolerance will decrease Outcome: Progressing   Problem: Nutrition: Goal: Adequate nutrition will be maintained Outcome: Progressing   Problem: Coping: Goal: Level of anxiety will decrease Outcome: Progressing   Problem: Elimination: Goal: Will not experience complications related to bowel motility Outcome: Progressing Goal: Will not experience complications related to urinary retention Outcome: Progressing   Problem: Pain Managment: Goal: General experience of comfort will improve and/or be controlled Outcome: Progressing   Problem: Safety: Goal: Ability to remain free from injury will improve Outcome: Progressing   Problem: Skin Integrity: Goal: Risk for impaired skin integrity will decrease Outcome: Progressing

## 2023-10-20 NOTE — Progress Notes (Signed)
   10/19/23 2000  Psych Admission Type (Psych Patients Only)  Admission Status Voluntary  Psychosocial Assessment  Patient Complaints Anxiety;Restlessness  Eye Contact Brief  Facial Expression Flat  Affect Apprehensive;Irritable  Speech Logical/coherent  Interaction Assertive  Motor Activity Restless  Appearance/Hygiene Improved  Behavior Characteristics Cooperative;Appropriate to situation;Calm  Mood Preoccupied  Thought Process  Coherency WDL  Content WDL  Delusions None reported or observed  Perception WDL  Hallucination None reported or observed  Judgment Limited  Confusion None  Danger to Self  Current suicidal ideation? Denies  Self-Injurious Behavior No self-injurious ideation or behavior indicators observed or expressed   Agreement Not to Harm Self Yes  Description of Agreement verbal  Danger to Others  Danger to Others None reported or observed   Patient is alert and oriented x 3, affect is blunted , he appear less irritable and receptive to staff. Patient's thoughts are disorganized, speech is tangential, he was offered emotional support. 15 minutes safety checks maintained.

## 2023-10-20 NOTE — Group Note (Signed)
 Date:  10/20/2023 Time:  12:41 AM  Group Topic/Focus:  Goals Group:   The focus of this group is to help patients establish daily goals to achieve during treatment and discuss how the patient can incorporate goal setting into their daily lives to aide in recovery. Self Care:   The focus of this group is to help patients understand the importance of self-care in order to improve or restore emotional, physical, spiritual, interpersonal, and financial health.    Participation Level:  Active  Participation Quality:  Appropriate  Affect:  Appropriate and Flat  Cognitive:  Appropriate and Oriented  Insight: Good  Engagement in Group:  Engaged  Modes of Intervention:  Discussion and Support  Additional Comments:  N/A  Ronald [name] 10/20/2023, 12:41 AM

## 2023-10-20 NOTE — Progress Notes (Signed)
 [facility] MD Progress Note  10/20/2023 6:15 PM Ronald [name]  MRN:  [id number] Patient is a 43 year old male who presented to the ED on 6/30 due to concerns about acute anxiety.  He has a psychiatric history of schizoaffective disorder bipolar type, GAD, PTSD, cannabis use disorder, and benzodiazepine use disorder.  There is no significant medical history reported.  Patient was recently discharged from [facility] on 09/02/2023.  He reports he did not follow-up following being discharged from the hospital and that he has been partially compliant with his medications reporting that he has been taking the Depakote    Subjective:  Chart reviewed, case discussed in multidisciplinary meeting, patient seen during rounds.   10/20/23: Patient seen for assessment. He reports presented to this setting to get my meds regulated He reports mood is good and denies any SI/HI. He reviews psychiatric history of PTSD following the death of his brother, reports he is living with his mother, he denies SI/HI, denies anxiety and depression, reports tolerating medications well. He is frustrated with peer and continued admission, he reports his family is important and there will be consequences. Review current medications and he is agreeable to Haldol  10mg  po at bedtime reporting this medication has worked for his mood when many others have failed. He is irritable and focused on persecutory ideations yet overall compliant. No changes in plan of care indicated today. Will monitor and make changes prn going forward.   7/6: Patient seen this morning for follow-up he is irritable and emotionally labile he continues to have conflict with another patient.  He required as needed medications due to agitation.  Discussed adding morning dose of Haldol  given ongoing mood lability.  Patient was not agreeable to this at that time however he did not end up receiving the as needed dose of Haldol .  He denied SI, HI and AVH prior to the as needed dose.  He was  more linear but easily agitated.  He is frustrated as another patient continues to call him a pedophile, discussed utilizing coping skills to mitigate this stressors, and that we would revisit further medication adjustments.  7/5: Patient seen for follow-up today.  They are apologetic for the behavior yesterday.  They note they were agitated because it hurt to be accused of being a pedophile.  Discussed that some people in the unit have a destructive behaviors and staff is working to address that.  Patient has been medication compliant and were able to tolerate tablet of Haldol  instead of requiring the injection.  After reading up on the long-acting injectable they request to proceed with the oral tablet.  They deny SI, HI, and AVH.  They still have some mood lability but are progressing well. Is able to affirm today the home dose is 1000 mg of depakote  er BID.   7/4: On interview patient is found in common area he is watching music videos with the other patients.  He is pleasant and cooperative.  On exam today he notes his anxiety is improved he feels he is tolerating the medications well he still has some paranoia about the oral Haldol  and indicates he would prefer to continue taking the IM injections nightly.  He denies SI, HI, and AVH.  He is more linear on exam today demonstrates proved insight into the need for ongoing medication management.  He is provided with the patient education on long-acting injectables and indicates he will review and make a decision on whether or not he is willing to receive the  long-acting injectable.  Discussed risk vs benefits and patient verbalized understanding.  Patient notes stable appetite and sleep.  They voiced no concerns or complaints at this time.  They received as needed trazodone  and Nicorette  overnight.  No medication adjustments continue to encourage patient to take oral medications.  7/3: On interview patient is found outside in the common area.  They are  interacting with other patients and staff appropriately.  They apologized for their previous behavior during this admission indicate they are agreeable with taking Haldol  and Depakote  at this time.  They report they allowed for them to draw lab level yesterday.  They deny current SI, HI, and AVH.  They are still somewhat tangential but are easily redirectable today.  They note their anxiety has been improved.  They do not discuss Ativan  today.  They are pleasant and cooperative with exam.  Discussed LAI at discharge and they requested more information, will provide them with this tomorrow they are open to considering LAI.  7/2: On interview patient is noted to be resting in bed.  He remains fixated on needing Ativan  to help with the anxiety.  When asked about the reason for his visit he talks about watching TV and thinking about grandparents and having an anxiety attack which prompted his ED visit.  He reports Klonopin  was the only medication that helped him and he does not require any Haldol  or any other medications.  He talks about his patient rides stating that he will not be taking any other medications after discharge and he will take dose only during the hospitalization.  Dr. Kenn was on the phone through [employer] as a second physician to approve enforced medication.  Patient reports that oral medications do not do well for him and he prefers IM injections.  Provider and second physician Dr. Srivastava tried to educate him on LAI which is an injectable form that he gets once in a month but patient exhibits concrete thought process and states he is going to stop all his medications on discharge and he is not going to take any LAI's.  He is not endorsing SI/HI and not responding to internal stimuli at this time.   Sleep: Fair  Appetite:  Fair  Past Psychiatric History: see h&P Family History: History reviewed. No pertinent family history. Social History:  Social History   Substance and  Sexual Activity  Alcohol Use No     Social History   Substance and Sexual Activity  Drug Use Yes   Types: Marijuana   Comment: one year ago    Social History   Socioeconomic History   Marital status: Single    Spouse name: Not on file   Number of children: Not on file   Years of education: Not on file   Highest education level: Not on file  Occupational History   Not on file  Tobacco Use   Smoking status: Never   Smokeless tobacco: Current    Types: Chew  Vaping Use   Vaping status: Never Used  Substance and Sexual Activity   Alcohol use: No   Drug use: Yes    Types: Marijuana    Comment: one year ago   Sexual activity: Not Currently  Other Topics Concern   Not on file  Social History Narrative   Not on file   Social Drivers of Health   Financial Resource Strain: Not on file  Food Insecurity: No Food Insecurity (10/13/2023)   Hunger Vital Sign    Worried  About Running Out of Food in the Last Year: Never true    Ran Out of Food in the Last Year: Never true  Transportation Needs: No Transportation Needs (10/13/2023)   PRAPARE - Administrator, Civil Service (Medical): No    Lack of Transportation (Non-Medical): No  Physical Activity: Not on file  Stress: Not on file  Social Connections: Not on file   Past Medical History:  Past Medical History:  Diagnosis Date   Anxiety    Arthritis    Disorder of pineal gland    Post traumatic stress disorder    Schizoaffective disorder, bipolar type (HCC) 08/29/2023   Severe manic bipolar 1 disorder with psychotic behavior (HCC) 08/29/2023    Past Surgical History:  Procedure Laterality Date   ANKLE SURGERY      Current Medications: Current Facility-Administered Medications  Medication Dose Route Frequency Provider Last Rate Last Admin   acetaminophen  (TYLENOL ) tablet 650 mg  650 mg Oral Q6H PRN Coleman, Carolyn H, NP   650 mg at 10/20/23 0821   alum & mag hydroxide-simeth (MAALOX/MYLANTA) 200-200-20  MG/5ML suspension 30 mL  30 mL Oral Q4H PRN Mardy Elveria DEL, NP       benztropine  (COGENTIN ) tablet 1 mg  1 mg Oral BID PRN Millington, Matthew E, PA-C   1 mg at 10/18/23 2028   Or   benztropine  mesylate (COGENTIN ) injection 1 mg  1 mg Intramuscular BID PRN Millington, Matthew E, PA-C       divalproex  (DEPAKOTE  ER) 24 hr tablet 1,000 mg  1,000 mg Oral BID Millington, Matthew E, PA-C   1,000 mg at 10/20/23 1726   gabapentin  (NEURONTIN ) capsule 200 mg  200 mg Oral TID Millington, Matthew E, PA-C   200 mg at 10/20/23 1727   haloperidol  (HALDOL ) tablet 10 mg  10 mg Oral QHS Millington, Matthew E, PA-C   10 mg at 10/19/23 2100   Or   haloperidol  lactate (HALDOL ) injection 10 mg  10 mg Intramuscular QHS Millington, Matthew E, PA-C       haloperidol  (HALDOL ) tablet 5 mg  5 mg Oral TID PRN Mardy Elveria DEL, NP       haloperidol  lactate (HALDOL ) injection 10 mg  10 mg Intramuscular TID PRN Millington, Matthew E, PA-C   10 mg at 10/19/23 1041   magnesium  hydroxide (MILK OF MAGNESIA) suspension 30 mL  30 mL Oral Daily PRN Mardy Elveria DEL, NP       nicotine  (NICODERM CQ  - dosed in mg/24 hours) patch 14 mg  14 mg Transdermal Daily Jadapalle, Sree, MD   14 mg at 10/20/23 9177   nicotine  polacrilex (NICORETTE ) gum 2 mg  2 mg Oral PRN Jadapalle, Sree, MD   2 mg at 10/20/23 1727   propranolol  (INDERAL ) tablet 10 mg  10 mg Oral BID PRN Millington, Matthew E, PA-C   10 mg at 10/18/23 2027   traZODone  (DESYREL ) tablet 50 mg  50 mg Oral QHS PRN Coleman, Carolyn H, NP   50 mg at 10/19/23 2113    Lab Results:  No results found for this or any previous visit (from the past 48 hours).   Blood Alcohol level:  Lab Results  Component Value Date   [facility] <15 10/13/2023   [facility] <15 08/15/2023    Metabolic Disorder Labs: Lab Results  Component Value Date   HGBA1C 4.9 05/17/2023   MPG 93.93 05/17/2023   MPG 93.93 09/29/2021   No results found for: PROLACTIN Lab Results  Component Value Date   CHOL 152  05/17/2023   TRIG 70 05/17/2023   HDL 78 05/17/2023   CHOLHDL 1.9 05/17/2023   VLDL 14 05/17/2023   LDLCALC 60 05/17/2023   LDLCALC 55 02/19/2020     Psychiatric Specialty Exam:  Presentation  General Appearance:  Casual  Eye Contact: Fair  Speech: Clear and Coherent; Normal Rate  Speech Volume: Normal    Mood and Affect  Mood: Irritable; Labile  Affect: Flat   Thought Process  Thought Processes: Irrevelant; Disorganized  Descriptions of Associations:Circumstantial  Orientation:Full (Time, Place and Person)  Thought Content:Perseveration  Hallucinations:Hallucinations: None  Ideas of Reference:None  Suicidal Thoughts:Suicidal Thoughts: No  Homicidal Thoughts:Homicidal Thoughts: No   Sensorium  Memory: Immediate Fair  Judgment: Poor  Insight: Poor   Executive Functions  Concentration: Fair  Attention Span: Fair  Recall: Good  Fund of Knowledge: Good  Language: Good   Psychomotor Activity  Psychomotor Activity: Psychomotor Activity: Restlessness  Musculoskeletal: Strength & Muscle Tone: within normal limits Gait & Station: normal Assets  Assets: Manufacturing systems engineer; Desire for Improvement; Housing    Physical Exam: Physical Exam Vitals and nursing note reviewed.  HENT:     Head: Atraumatic.  Eyes:     Extraocular Movements: Extraocular movements intact.  Pulmonary:     Effort: Pulmonary effort is normal.  Neurological:     Mental Status: He is alert and oriented to person, place, and time.    Review of Systems  Psychiatric/Behavioral:  Negative for hallucinations, substance abuse and suicidal ideas.    Blood pressure (!) 139/98, pulse 79, temperature (!) 97.5 F (36.4 C), resp. rate 15, height 5' 11 (1.803 m), weight 77.1 kg, SpO2 97%. Body mass index is 23.71 kg/m.  Diagnosis: Principal Problem:   Schizoaffective disorder [facility])    Clinical Decision Making:   Treatment Plan Summary:    Safety  and Monitoring:             -- Admission to inpatient psychiatric unit for safety, stabilization and treatment             -- Daily contact with patient to assess and evaluate symptoms and progress in treatment             -- Patient's case to be discussed in multi-disciplinary team meeting             -- Observation Level: q15 minute checks             -- Vital signs:  q12 hours             -- Precautions: suicide, elopement, and assault   2. Psychiatric Diagnoses and Treatment:  Non emergent forced medication consult appreciated completed by Dr. Kenn and Dr. Donnelly              Schizoaffective disorder             - Continue 1000 mg of Depakote  ER twice daily, level on 7/2 was 53             -- Continue gabapentin  100 mg 3 times daily for mood and anxiety, increased back to home dose of 200 mg TID             -- Continue Haldol  10 mg nightly will titrate-if refuses patient will receive IM Haldol  10 mg nightly as enforced medication approved by 2 psychiatrists.             -- Both allergy to Zyprexa  and Seroquel   are noted on chart review, however no evidence of a true allergic reaction.    -- The risks/benefits/side-effects/alternatives to this medication were discussed in detail with the patient and time was given for questions. The patient consents to medication trial.                -- Metabolic profile and EKG monitoring obtained while on an atypical antipsychotic (BMI: Lipid Panel: HbgA1c: QTc:)              -- Encouraged patient to participate in unit milieu and in scheduled group therapies                            3. Medical Issues Being Addressed:     no acute concerns  4. Discharge Planning:   --  Discharge Late week  -- Social work and case management to assist with discharge planning and identification of hospital follow-up needs prior to discharge  -- Estimated LOS: 3-4 days  Hoy CHRISTELLA Pinal, NP 10/20/2023, 6:15 PM

## 2023-10-20 NOTE — Progress Notes (Signed)
 Pt pleasant during assessment denying SI/HI/AVH. Pt observed by this Clinical research associate interacting appropriately with staff and peers on the unit. Pt requested to get his Haldol  by IM and not PO. Pt compliant with medication administration per MD orders. Pt given education, support, and encouragement to be active in his treatment plan. Pt being monitored Q 15 minutes for safety per unit protocol, remains safe on the unit

## 2023-10-20 NOTE — Group Note (Signed)
 Date:  10/20/2023 Time:  5:46 PM  Group Topic/Focus:  Goals Group:   The focus of this group is to help patients establish daily goals to achieve during treatment and discuss how the patient can incorporate goal setting into their daily lives to aide in recovery. Healthy Communication:   The focus of this group is to discuss communication, barriers to communication, as well as healthy ways to communicate with others.    Participation Level:  Active  Participation Quality:  Appropriate  Affect:  Appropriate  Cognitive:  Appropriate  Insight: Appropriate  Engagement in Group:  Engaged  Modes of Intervention:  Activity and Socialization  Additional Comments:    Ronald [name] 10/20/2023, 5:46 PM

## 2023-10-21 MED ORDER — HALOPERIDOL 10 MG PO TABS: 10.0000 mg | ORAL_TABLET | Freq: Every day | ORAL | 0 refills | Status: DC

## 2023-10-21 MED ORDER — DIVALPROEX SODIUM ER 500 MG PO TB24: 1000.0000 mg | ORAL_TABLET | Freq: Two times a day (BID) | ORAL | 0 refills | Status: DC

## 2023-10-21 MED ORDER — BENZTROPINE MESYLATE 1 MG PO TABS: 1.0000 mg | ORAL_TABLET | Freq: Two times a day (BID) | ORAL | 0 refills | Status: DC | PRN

## 2023-10-21 MED ORDER — GABAPENTIN 100 MG PO CAPS: 200.0000 mg | ORAL_CAPSULE | Freq: Three times a day (TID) | ORAL | 0 refills | Status: DC

## 2023-10-21 NOTE — Group Note (Signed)
 Date:  10/21/2023 Time:  10:22 AM  Group Topic/Focus:  Building Self Esteem:   The Focus of this group is helping patients become aware of the effects of self-esteem on their lives, the things they and others do that enhance or undermine their self-esteem, seeing the relationship between their level of self-esteem and the choices they make and learning ways to enhance self-esteem.    Participation Level:  Active  Participation Quality:  Appropriate  Affect:  Appropriate  Cognitive:  Appropriate  Insight: Appropriate  Engagement in Group:  Engaged  Modes of Intervention:  Activity  Additional Comments:    Ronald [name] Ronald [name] 10/21/2023, 10:22 AM

## 2023-10-21 NOTE — Plan of Care (Signed)
   Problem: Education: Goal: Emotional status will improve Outcome: Progressing Goal: Mental status will improve Outcome: Progressing

## 2023-10-21 NOTE — Progress Notes (Incomplete)
 [facility] Discharge Suicide Risk Assessment   Principal Problem: Schizoaffective disorder [facility]) Discharge Diagnoses: Principal Problem:   Schizoaffective disorder (HCC)   Total Time spent with patient: 30 minutes  Musculoskeletal: Strength & Muscle Tone: within normal limits Gait & Station: normal   Psychiatric Specialty Exam  Presentation  General Appearance:  Appropriate for Environment  Eye Contact: Good  Speech: Normal Rate  Speech Volume: Normal  Handedness: Right   Mood and Affect  Mood: Euthymic  Duration of Depression Symptoms: Less than two weeks  Affect: Flat   Thought Process  Thought Processes: Linear  Descriptions of Associations:Intact  Orientation:Full (Time, Place and Person)  Thought Content:Logical  History of Schizophrenia/Schizoaffective disorder:No  Duration of Psychotic Symptoms:N/A  Hallucinations:Hallucinations: None  Ideas of Reference:None  Suicidal Thoughts:Suicidal Thoughts: No  Homicidal Thoughts:Homicidal Thoughts: No   Sensorium  Memory: Immediate Good; Recent Good  Judgment: Fair  Insight: Fair   Art therapist  Concentration: Fair  Attention Span: Good  Recall: Good  Fund of Knowledge: Good  Language: Good   Psychomotor Activity  Psychomotor Activity:No data recorded  Assets  Assets: Communication Skills; Financial Resources/Insurance; Physical Health; Housing; Social Support   Sleep  Sleep:Sleep: Good  Estimated Sleeping Duration (Last 24 Hours): 7.75-9.75 hours  Physical Exam: Physical Exam ROS Blood pressure (!) 139/98, pulse 79, temperature (!) 97.5 F (36.4 C), resp. rate 15, height 5' 11 (1.803 m), weight 78.5 kg, SpO2 97%. Body mass index is 24.13 kg/m.  Mental Status Per Nursing Assessment::   On Admission:  NA  Demographic Factors:  {Demographic Factors:20662}  Loss Factors: {Loss Factors:20659}  Historical Factors: {Historical Factors:20660}  Risk  Reduction Factors:   {Risk Reduction Factors:20661}  Continued Clinical Symptoms:  {Clinical Factors:22706}  Cognitive Features That Contribute To Risk:  {chl bhh Cognitive Features:304700251}    Suicide Risk:  {BHH SUICIDE RISK:22704}   Follow-up Information     [facility] Follow up.   Why: Appointment is scheduled for on 7/18 at 8:30 via the phone and will receive a call at [phone] Contact information: [address]  [address] [name] [address] [phone]                  Hoy CHRISTELLA Pinal, NP 10/21/2023, 3:08 PM

## 2023-10-21 NOTE — Group Note (Signed)
 LCSW Group Therapy Note  Group Date: 10/21/2023 Start Time: 1300 End Time: 1400   Type of Therapy and Topic:  Group Therapy - Healthy vs Unhealthy Coping Skills  Participation Level:  Active   Description of Group The focus of this group was to determine what unhealthy coping techniques typically are used by group members and what healthy coping techniques would be helpful in coping with various problems. Patients were guided in becoming aware of the differences between healthy and unhealthy coping techniques. Patients were asked to identify 2-3 healthy coping skills they would like to learn to use more effectively.  Therapeutic Goals Patients learned that coping is what human beings do all day long to deal with various situations in their lives Patients defined and discussed healthy vs unhealthy coping techniques Patients identified their preferred coping techniques and identified whether these were healthy or unhealthy Patients determined 2-3 healthy coping skills they would like to become more familiar with and use more often. Patients provided support and ideas to each other   Summary of Patient Progress:  During group, patient expressed openly. Patient proved open to input from peers and feedback from CSW. Patient demonstrated proficient insight into the subject matter, was respectful of peers, and participated throughout the entire session.   Therapeutic Modalities Cognitive Behavioral Therapy Motivational Interviewing  Alveta CHRISTELLA Kerns, LCSWA 10/21/2023  2:00 PM

## 2023-10-21 NOTE — Progress Notes (Signed)
 Pt pleasant during assessment denying SI/HI/AVH. Pt observed by this Clinical research associate interacting appropriately with staff and peers on the unit. Pt requested to get his Haldol  by IM and not PO. Pt compliant with medication administration per MD orders. Pt given education, support, and encouragement to be active in his treatment plan. Pt being monitored Q 15 minutes for safety per unit protocol, remains safe on the unit

## 2023-10-21 NOTE — Plan of Care (Signed)
  Problem: Education: Goal: Knowledge of [facility] Education information/materials will improve Outcome: Completed/Met Goal: Mental status will improve Outcome: Progressing Goal: Verbalization of understanding the information provided will improve Outcome: Adequate for Discharge   Problem: Activity: Goal: Sleeping patterns will improve Outcome: Adequate for Discharge   Problem: Coping: Goal: Ability to verbalize frustrations and anger appropriately will improve Outcome: Progressing

## 2023-10-21 NOTE — Progress Notes (Signed)
 [facility] MD Progress Note  10/21/2023 4:26 PM Ronald [name]  MRN:  [id number] Patient is a 43 year old male who presented to the ED on 6/30 due to concerns about acute anxiety.  He has a psychiatric history of schizoaffective disorder bipolar type, GAD, PTSD, cannabis use disorder, and benzodiazepine use disorder.  There is no significant medical history reported.  Patient was recently discharged from [facility] on 09/02/2023.  He reports he did not follow-up following being discharged from the hospital and that he has been partially compliant with his medications reporting that he has been taking the Depakote    Subjective:  Chart reviewed, case discussed in multidisciplinary meeting, patient seen during rounds.   Patient seen today for follow-up psychiatric evaluation. He reports mood is stable and denies anxiety. He denies suicidal or homicidal ideation. Denies hallucinations, paranoia, or delusions. Reports sleep and appetite are adequate. He endorses readiness for discharge, stating, "Yeah, I'm ready to get out of here." Denies medication side effects and there are none noted.   10/20/23: Patient seen for assessment. He reports presented to this setting to get my meds regulated He reports mood is good and denies any SI/HI. He reviews psychiatric history of PTSD following the death of his brother, reports he is living with his mother, he denies SI/HI, denies anxiety and depression, reports tolerating medications well. He is frustrated with peer and continued admission, he reports his family is important and there will be consequences. Review current medications and he is agreeable to Haldol  10mg  po at bedtime reporting this medication has worked for his mood when many others have failed. He is irritable and focused on persecutory ideations yet overall compliant. No changes in plan of care indicated today. Will monitor and make changes prn going forward.   7/6: Patient seen this morning for follow-up he is  irritable and emotionally labile he continues to have conflict with another patient.  He required as needed medications due to agitation.  Discussed adding morning dose of Haldol  given ongoing mood lability.  Patient was not agreeable to this at that time however he did not end up receiving the as needed dose of Haldol .  He denied SI, HI and AVH prior to the as needed dose.  He was more linear but easily agitated.  He is frustrated as another patient continues to call him a pedophile, discussed utilizing coping skills to mitigate this stressors, and that we would revisit further medication adjustments.  7/5: Patient seen for follow-up today.  They are apologetic for the behavior yesterday.  They note they were agitated because it hurt to be accused of being a pedophile.  Discussed that some people in the unit have a destructive behaviors and staff is working to address that.  Patient has been medication compliant and were able to tolerate tablet of Haldol  instead of requiring the injection.  After reading up on the long-acting injectable they request to proceed with the oral tablet.  They deny SI, HI, and AVH.  They still have some mood lability but are progressing well. Is able to affirm today the home dose is 1000 mg of depakote  er BID.   7/4: On interview patient is found in common area he is watching music videos with the other patients.  He is pleasant and cooperative.  On exam today he notes his anxiety is improved he feels he is tolerating the medications well he still has some paranoia about the oral Haldol  and indicates he would prefer to continue taking the IM  injections nightly.  He denies SI, HI, and AVH.  He is more linear on exam today demonstrates proved insight into the need for ongoing medication management.  He is provided with the patient education on long-acting injectables and indicates he will review and make a decision on whether or not he is willing to receive the long-acting injectable.   Discussed risk vs benefits and patient verbalized understanding.  Patient notes stable appetite and sleep.  They voiced no concerns or complaints at this time.  They received as needed trazodone  and Nicorette  overnight.  No medication adjustments continue to encourage patient to take oral medications.  7/3: On interview patient is found outside in the common area.  They are interacting with other patients and staff appropriately.  They apologized for their previous behavior during this admission indicate they are agreeable with taking Haldol  and Depakote  at this time.  They report they allowed for them to draw lab level yesterday.  They deny current SI, HI, and AVH.  They are still somewhat tangential but are easily redirectable today.  They note their anxiety has been improved.  They do not discuss Ativan  today.  They are pleasant and cooperative with exam.  Discussed LAI at discharge and they requested more information, will provide them with this tomorrow they are open to considering LAI.  7/2: On interview patient is noted to be resting in bed.  He remains fixated on needing Ativan  to help with the anxiety.  When asked about the reason for his visit he talks about watching TV and thinking about grandparents and having an anxiety attack which prompted his ED visit.  He reports Klonopin  was the only medication that helped him and he does not require any Haldol  or any other medications.  He talks about his patient rides stating that he will not be taking any other medications after discharge and he will take dose only during the hospitalization.  Dr. Kenn was on the phone through [employer] as a second physician to approve enforced medication.  Patient reports that oral medications do not do well for him and he prefers IM injections.  Provider and second physician Dr. Srivastava tried to educate him on LAI which is an injectable form that he gets once in a month but patient exhibits concrete thought  process and states he is going to stop all his medications on discharge and he is not going to take any LAI's.  He is not endorsing SI/HI and not responding to internal stimuli at this time.   Sleep: Fair  Appetite:  Fair  Past Psychiatric History: see h&P Family History: History reviewed. No pertinent family history. Social History:  Social History   Substance and Sexual Activity  Alcohol Use No     Social History   Substance and Sexual Activity  Drug Use Yes   Types: Marijuana   Comment: one year ago    Social History   Socioeconomic History   Marital status: Single    Spouse name: Not on file   Number of children: Not on file   Years of education: Not on file   Highest education level: Not on file  Occupational History   Not on file  Tobacco Use   Smoking status: Never   Smokeless tobacco: Current    Types: Chew  Vaping Use   Vaping status: Never Used  Substance and Sexual Activity   Alcohol use: No   Drug use: Yes    Types: Marijuana    Comment:  one year ago   Sexual activity: Not Currently  Other Topics Concern   Not on file  Social History Narrative   Not on file   Social Drivers of Health   Financial Resource Strain: Not on file  Food Insecurity: No Food Insecurity (10/13/2023)   Hunger Vital Sign    Worried About Running Out of Food in the Last Year: Never true    Ran Out of Food in the Last Year: Never true  Transportation Needs: No Transportation Needs (10/13/2023)   PRAPARE - Administrator, Civil Service (Medical): No    Lack of Transportation (Non-Medical): No  Physical Activity: Not on file  Stress: Not on file  Social Connections: Not on file   Past Medical History:  Past Medical History:  Diagnosis Date   Anxiety    Arthritis    Disorder of pineal gland    Post traumatic stress disorder    Schizoaffective disorder, bipolar type (HCC) 08/29/2023   Severe manic bipolar 1 disorder with psychotic behavior (HCC) 08/29/2023     Past Surgical History:  Procedure Laterality Date   ANKLE SURGERY      Current Medications: Current Facility-Administered Medications  Medication Dose Route Frequency Provider Last Rate Last Admin   acetaminophen  (TYLENOL ) tablet 650 mg  650 mg Oral Q6H PRN Mardy Elveria DEL, NP   650 mg at 10/20/23 0821   alum & mag hydroxide-simeth (MAALOX/MYLANTA) 200-200-20 MG/5ML suspension 30 mL  30 mL Oral Q4H PRN Mardy Elveria DEL, NP       benztropine  (COGENTIN ) tablet 1 mg  1 mg Oral BID PRN Millington, Matthew E, PA-C   1 mg at 10/18/23 2028   Or   benztropine  mesylate (COGENTIN ) injection 1 mg  1 mg Intramuscular BID PRN Millington, Matthew E, PA-C       divalproex  (DEPAKOTE  ER) 24 hr tablet 1,000 mg  1,000 mg Oral BID Millington, Matthew E, PA-C   1,000 mg at 10/21/23 0820   gabapentin  (NEURONTIN ) capsule 200 mg  200 mg Oral TID Millington, Matthew E, PA-C   200 mg at 10/21/23 1204   haloperidol  (HALDOL ) tablet 10 mg  10 mg Oral QHS Millington, Matthew E, PA-C   10 mg at 10/20/23 8044   Or   haloperidol  lactate (HALDOL ) injection 10 mg  10 mg Intramuscular QHS Millington, Matthew E, PA-C       haloperidol  (HALDOL ) tablet 5 mg  5 mg Oral TID PRN Mardy Elveria DEL, NP       haloperidol  lactate (HALDOL ) injection 10 mg  10 mg Intramuscular TID PRN Millington, Matthew E, PA-C   10 mg at 10/19/23 1041   magnesium  hydroxide (MILK OF MAGNESIA) suspension 30 mL  30 mL Oral Daily PRN Coleman, Carolyn H, NP       nicotine  (NICODERM CQ  - dosed in mg/24 hours) patch 14 mg  14 mg Transdermal Daily Jadapalle, Sree, MD   14 mg at 10/21/23 9180   nicotine  polacrilex (NICORETTE ) gum 2 mg  2 mg Oral PRN Jadapalle, Sree, MD   2 mg at 10/21/23 1356   propranolol  (INDERAL ) tablet 10 mg  10 mg Oral BID PRN Millington, Matthew E, PA-C   10 mg at 10/18/23 2027   traZODone  (DESYREL ) tablet 50 mg  50 mg Oral QHS PRN Coleman, Carolyn H, NP   50 mg at 10/20/23 1955    Lab Results:  No results found for this or any  previous visit (from the past 48 hours).  Blood Alcohol level:  Lab Results  Component Value Date   [facility] <15 10/13/2023   ETH <15 08/15/2023    Metabolic Disorder Labs: Lab Results  Component Value Date   HGBA1C 4.9 05/17/2023   MPG 93.93 05/17/2023   MPG 93.93 09/29/2021   No results found for: PROLACTIN Lab Results  Component Value Date   CHOL 152 05/17/2023   TRIG 70 05/17/2023   HDL 78 05/17/2023   CHOLHDL 1.9 05/17/2023   VLDL 14 05/17/2023   LDLCALC 60 05/17/2023   LDLCALC 55 02/19/2020     Psychiatric Specialty Exam:  Presentation  General Appearance:  Appropriate for Environment  Eye Contact: Good  Speech: Normal Rate  Speech Volume: Normal    Mood and Affect  Mood: Euthymic  Affect: Flat   Thought Process  Thought Processes: Linear  Descriptions of Associations:Intact  Orientation:Full (Time, Place and Person)  Thought Content:Logical  Hallucinations:Hallucinations: None  Ideas of Reference:None  Suicidal Thoughts:Suicidal Thoughts: No  Homicidal Thoughts:Homicidal Thoughts: No   Sensorium  Memory: Immediate Good; Recent Good  Judgment: Fair  Insight: Fair   Art therapist  Concentration: Fair  Attention Span: Good  Recall: Good  Fund of Knowledge: Good  Language: Good   Psychomotor Activity  Psychomotor Activity: No data recorded  Musculoskeletal: Strength & Muscle Tone: within normal limits Gait & Station: normal Assets  Assets: Manufacturing systems engineer; Financial Resources/Insurance; Physical Health; Housing; Social Support    Physical Exam: Physical Exam Vitals and nursing note reviewed.  HENT:     Head: Atraumatic.  Eyes:     Extraocular Movements: Extraocular movements intact.  Pulmonary:     Effort: Pulmonary effort is normal.  Neurological:     Mental Status: He is alert and oriented to person, place, and time.    Review of Systems  Psychiatric/Behavioral:  Negative for  hallucinations, substance abuse and suicidal ideas.    Blood pressure (!) 139/98, pulse 79, temperature (!) 97.5 F (36.4 C), resp. rate 15, height 5' 11 (1.803 m), weight 78.5 kg, SpO2 97%. Body mass index is 24.13 kg/m.  Diagnosis: Principal Problem:   Schizoaffective disorder [facility])    Clinical Decision Making:  Patient presentation is meeting diagnostic criteria for schizoaffective disorder, bipolar type, based on historical diagnosis and clinical course. He appears psychiatrically stable at this time with no overt signs of psychosis or mood instability. Patient continues to require this acute inpatient psychiatric setting for safety, medication management, and symptom stabilization to address need for continued treatment.  Patient remains psychiatrically stable without acute safety concerns. No evidence of psychosis or mania at this time. He continues to verbalize readiness for discharge. Will continue medications as prescribed. Plan includes ensuring safe discharge with outpatient follow-up.    Safety and Monitoring:             -- Admission to inpatient psychiatric unit for safety, stabilization and treatment             -- Daily contact with patient to assess and evaluate symptoms and progress in treatment             -- Patient's case to be discussed in multi-disciplinary team meeting             -- Observation Level: q15 minute checks             -- Vital signs:  q12 hours             -- Precautions: suicide, elopement, and assault  2. Psychiatric Diagnoses and Treatment:  Non emergent forced medication consult appreciated completed by Dr. Kenn and Dr. Donnelly              Schizoaffective disorder             - Continue 1000 mg of Depakote  ER twice daily, level on 7/2 was 53             -- Continue gabapentin  100 mg 3 times daily for mood and anxiety, increased back to home dose of 200 mg TID             -- Continue Haldol  10 mg nightly will titrate-if refuses patient  will receive IM Haldol  10 mg nightly as enforced medication approved by 2 psychiatrists.             -- Both allergy to Zyprexa  and Seroquel  are noted on chart review, however no evidence of a true allergic reaction.    -- The risks/benefits/side-effects/alternatives to this medication were discussed in detail with the patient and time was given for questions. The patient consents to medication trial.                -- Metabolic profile and EKG monitoring obtained while on an atypical antipsychotic (BMI: Lipid Panel: HbgA1c: QTc:)              -- Encouraged patient to participate in unit milieu and in scheduled group therapies                            3. Medical Issues Being Addressed:     no acute concerns  4. Discharge Planning:   --  Discharge Late week  -- Social work and case management to assist with discharge planning and identification of hospital follow-up needs prior to discharge  -- Estimated LOS: 3-4 days  Hoy CHRISTELLA Pinal, NP 10/21/2023, 4:26 PM

## 2023-10-21 NOTE — Group Note (Signed)
 Date:  10/21/2023 Time:  3:59 PM  Group Topic/Focus:  Personal Choices and Values:   The focus of this group is to help patients assess and explore the importance of values in their lives, how their values affect their decisions, how they express their values and what opposes their expression.    Participation Level:  Active  Participation Quality:  Appropriate  Affect:  Appropriate  Cognitive:  Appropriate  Insight: Appropriate  Engagement in Group:  Engaged  Modes of Intervention:  Activity  Additional Comments:    Ronald [name] 10/21/2023, 3:59 PM

## 2023-10-21 NOTE — Group Note (Signed)
 Date:  10/21/2023 Time:  8:36 PM  Group Topic/Focus:  Building Self Esteem:   The Focus of this group is helping patients become aware of the effects of self-esteem on their lives, the things they and others do that enhance or undermine their self-esteem, seeing the relationship between their level of self-esteem and the choices they make and learning ways to enhance self-esteem. Goals Group:   The focus of this group is to help patients establish daily goals to achieve during treatment and discuss how the patient can incorporate goal setting into their daily lives to aide in recovery. Wrap-Up Group:   The focus of this group is to help patients review their daily goal of treatment and discuss progress on daily workbooks.    Participation Level:  Active  Participation Quality:  Sharing  Affect:  Appropriate  Cognitive:  Appropriate  Insight: Good  Engagement in Group:  Engaged  Modes of Intervention:  Activity and Discussion  Additional Comments:    [name] 10/21/2023, 8:36 PM

## 2023-10-21 NOTE — BH IP Treatment Plan (Signed)
 Interdisciplinary Treatment and Diagnostic Plan Update  10/21/2023 Time of Session: 10/20/23 at 14:00 EWARD RUTIGLIANO MRN: [id number]  Principal Diagnosis: Schizoaffective disorder [facility])  Secondary Diagnoses: Principal Problem:   Schizoaffective disorder (HCC)   Current Medications:  Current Facility-Administered Medications  Medication Dose Route Frequency Provider Last Rate Last Admin   acetaminophen  (TYLENOL ) tablet 650 mg  650 mg Oral Q6H PRN Coleman, Carolyn H, NP   650 mg at 10/20/23 9178   alum & mag hydroxide-simeth (MAALOX/MYLANTA) 200-200-20 MG/5ML suspension 30 mL  30 mL Oral Q4H PRN Coleman, Carolyn H, NP       benztropine  (COGENTIN ) tablet 1 mg  1 mg Oral BID PRN Millington, Matthew E, PA-C   1 mg at 10/18/23 2028   Or   benztropine  mesylate (COGENTIN ) injection 1 mg  1 mg Intramuscular BID PRN Millington, Matthew E, PA-C       divalproex  (DEPAKOTE  ER) 24 hr tablet 1,000 mg  1,000 mg Oral BID Millington, Matthew E, PA-C   1,000 mg at 10/21/23 0820   gabapentin  (NEURONTIN ) capsule 200 mg  200 mg Oral TID Millington, Matthew E, PA-C   200 mg at 10/21/23 9180   haloperidol  (HALDOL ) tablet 10 mg  10 mg Oral QHS Millington, Matthew E, PA-C   10 mg at 10/20/23 8044   Or   haloperidol  lactate (HALDOL ) injection 10 mg  10 mg Intramuscular QHS Millington, Matthew E, PA-C       haloperidol  (HALDOL ) tablet 5 mg  5 mg Oral TID PRN Mardy Elveria DEL, NP       haloperidol  lactate (HALDOL ) injection 10 mg  10 mg Intramuscular TID PRN Millington, Matthew E, PA-C   10 mg at 10/19/23 1041   magnesium  hydroxide (MILK OF MAGNESIA) suspension 30 mL  30 mL Oral Daily PRN Mardy Elveria DEL, NP       nicotine  (NICODERM CQ  - dosed in mg/24 hours) patch 14 mg  14 mg Transdermal Daily Jadapalle, Sree, MD   14 mg at 10/21/23 9180   nicotine  polacrilex (NICORETTE ) gum 2 mg  2 mg Oral PRN Jadapalle, Sree, MD   2 mg at 10/21/23 9178   propranolol  (INDERAL ) tablet 10 mg  10 mg Oral BID PRN Millington,  Matthew E, PA-C   10 mg at 10/18/23 2027   traZODone  (DESYREL ) tablet 50 mg  50 mg Oral QHS PRN Coleman, Carolyn H, NP   50 mg at 10/20/23 1955   PTA Medications: Medications Prior to Admission  Medication Sig Dispense Refill Last Dose/Taking   traZODone  (DESYREL ) 100 MG tablet Take 1 tablet (100 mg total) by mouth at bedtime. 30 tablet 0 Past Month   divalproex  (DEPAKOTE  ER) 500 MG 24 hr tablet Take 2 tablets (1,000 mg total) by mouth 2 times daily at 12 noon and 4 pm. 120 tablet 0    gabapentin  (NEURONTIN ) 100 MG capsule Take 2 capsules (200 mg total) by mouth 3 (three) times daily. 180 capsule 3    haloperidol  (HALDOL ) 10 MG tablet Take 3 tablets (30 mg total) by mouth at bedtime. 90 tablet 0    MAGNESIUM  OXIDE, ELEMENTAL, PO Take 500 mg by mouth at bedtime.      Multiple Vitamin (MULTIVITAMIN WITH MINERALS) TABS tablet Take 1 tablet by mouth daily with breakfast.      nicotine  (NICODERM CQ  - DOSED IN MG/24 HOURS) 14 mg/24hr patch Place 1 patch (14 mg total) onto the skin daily. 28 patch 0     Patient Stressors: Other: thinking  about family that has passed away    Patient Strengths: Ability for insight  Supportive family/friends   Treatment Modalities: Medication Management, Group therapy, Case management,  1 to 1 session with clinician, Psychoeducation, Recreational therapy.   Physician Treatment Plan for Primary Diagnosis: Schizoaffective disorder (HCC) Long Term Goal(s): Improvement in symptoms so as ready for discharge   Short Term Goals: Ability to maintain clinical measurements within normal limits will improve Compliance with prescribed medications will improve Ability to identify triggers associated with substance abuse/mental health issues will improve  Medication Management: Evaluate patient's response, side effects, and tolerance of medication regimen.  Therapeutic Interventions: 1 to 1 sessions, Unit Group sessions and Medication administration.  Evaluation of  Outcomes: Progressing  Physician Treatment Plan for Secondary Diagnosis: Principal Problem:   Schizoaffective disorder (HCC)  Long Term Goal(s): Improvement in symptoms so as ready for discharge   Short Term Goals: Ability to maintain clinical measurements within normal limits will improve Compliance with prescribed medications will improve Ability to identify triggers associated with substance abuse/mental health issues will improve     Medication Management: Evaluate patient's response, side effects, and tolerance of medication regimen.  Therapeutic Interventions: 1 to 1 sessions, Unit Group sessions and Medication administration.  Evaluation of Outcomes: Progressing   RN Treatment Plan for Primary Diagnosis: Schizoaffective disorder (HCC) Long Term Goal(s): Knowledge of disease and therapeutic regimen to maintain health will improve  Short Term Goals: Ability to remain free from injury will improve, Ability to verbalize frustration and anger appropriately will improve, Ability to demonstrate self-control, Ability to participate in decision making will improve, Ability to verbalize feelings will improve, Ability to disclose and discuss suicidal ideas, Ability to identify and develop effective coping behaviors will improve, and Compliance with prescribed medications will improve  Medication Management: RN will administer medications as ordered by provider, will assess and evaluate patient's response and provide education to patient for prescribed medication. RN will report any adverse and/or side effects to prescribing provider.  Therapeutic Interventions: 1 on 1 counseling sessions, Psychoeducation, Medication administration, Evaluate responses to treatment, Monitor vital signs and CBGs as ordered, Perform/monitor CIWA, COWS, AIMS and Fall Risk screenings as ordered, Perform wound care treatments as ordered.  Evaluation of Outcomes:  Progressing  LCSW Treatment Plan for Primary  Diagnosis: Schizoaffective disorder (HCC) Long Term Goal(s): Safe transition to appropriate next level of care at discharge, Engage patient in therapeutic group addressing interpersonal concerns.  Short Term Goals: Engage patient in aftercare planning with referrals and resources, Increase social support, Increase ability to appropriately verbalize feelings, Increase emotional regulation, Facilitate acceptance of mental health diagnosis and concerns, Facilitate patient progression through stages of change regarding substance use diagnoses and concerns, Identify triggers associated with mental health/substance abuse issues, and Increase skills for wellness and recovery  Therapeutic Interventions: Assess for all discharge needs, 1 to 1 time with Social worker, Explore available resources and support systems, Assess for adequacy in community support network, Educate family and significant other(s) on suicide prevention, Complete Psychosocial Assessment, Interpersonal group therapy.  Evaluation of Outcomes: Progressing   Progress in Treatment: Attending groups: Yes. Participating in groups: Yes. Taking medication as prescribed: Yes. Toleration medication: Yes. Family/Significant other contact made: Yes, individual(s) contacted:  mother, [name].  Patient understands diagnosis: Yes. Discussing patient identified problems/goals with staff: Yes. Medical problems stabilized or resolved: Yes. Denies suicidal/homicidal ideation: Yes. Issues/concerns per patient self-inventory: No. Other: none.   New problem(s) identified: Yes, Describe:  Patient became irate and was unable to  be redirected during treatment team meeting and had to be escorted out for safety precautions. Update 10/20/23: Pt less irritable and becoming more easily redirectable.    New Short Term/Long Term Goal(s):detox, elimination of symptoms of psychosis, medication management for mood stabilization; elimination of SI thoughts;  development of comprehensive mental wellness/sobriety plan. Update 10/20/23: No changes at this time.     Patient Goals:  To get my medications regulated. Update 10/20/23: No changes at this time.   Discharge Plan or Barriers: CSW to assist with the development of appropriate discharge plan. Update 10/20/23: No changes at this time.    Reason for Continuation of Hospitalization: Aggression Anxiety Delusions  Depression Hallucinations [facility] Medical Issues Medication stabilization Suicidal ideation   Estimated Length of Stay: 1-7 days. Update 10/20/23: TBD   Last 3 [place] Suicide Severity Risk Score: Flowsheet Row Admission (Current) from [facility]13/2023 in [facility] Most recent reading at 10/13/2023  4:07 PM ED from 10/13/2023 in [facility] Most recent reading at 10/13/2023  8:14 AM [name] Most recent reading at 08/16/2023 12:50 PM  C-SSRS RISK CATEGORY No Risk No Risk No Risk    Last PHQ 2/9 Scores:    06/13/2023    8:06 AM 11/08/2022   10:00 AM 01/14/2022    1:09 PM  Depression screen PHQ 2/9  Decreased Interest 0 0 0  Down, Depressed, Hopeless 0 0 0  PHQ - 2 Score 0 0 0  Altered sleeping 0 0 0  Tired, decreased energy 0 0 0  Change in appetite 0 0 0  Feeling bad or failure about yourself  0 0 0  Trouble concentrating 0 0 0  Moving slowly or fidgety/restless 0 0 0  Suicidal thoughts 0 0 0  PHQ-9 Score 0 0 0  Difficult doing work/chores Not difficult at all      Scribe for Treatment Team: Nadara JONELLE Fam, LCSW 10/21/2023 10:51 AM

## 2023-10-21 NOTE — Group Note (Signed)
 Recreation Therapy Group Note   Group Topic:Goal Setting  Group Date: 10/21/2023 Start Time: 1010 End Time: 1100 Facilitators: Celestia Jeoffrey BRAVO, LRT, CTRS Location: Craft Room  Group Description: Product/process development scientist. Patients were given many different magazines, a glue stick, markers, and a piece of cardstock paper. LRT and pts discussed the importance of having goals in life. LRT and pts discussed the difference between short-term and long-term goals, as well as what a SMART goal is. LRT encouraged pts to create a vision board, with images they picked and then cut out with safety scissors from the magazine, for themselves, that capture their short and long-term goals. LRT encouraged pts to show and explain their vision board to the group.   Goal Area(s) Addressed:  Patient will gain knowledge of short vs. long term goals.  Patient will identify goals for themselves. Patient will practice setting SMART goals. Patient will verbalize their goals to LRT and peers.   Affect/Mood: Appropriate   Participation Level: Active and Engaged   Participation Quality: Independent   Behavior: Appropriate, Calm, and Cooperative   Speech/Thought Process: Coherent   Insight: Good   Judgement: Good   Modes of Intervention: Art   Patient Response to Interventions:  Attentive, Engaged, Interested , and Receptive   Education Outcome:  Acknowledges education   Clinical Observations/Individualized Feedback: Ronald [name] was active in their participation of session activities and group discussion. Pt identified have my own farm as his goal. Pt interacted well with LRT and peers duration of session.    Plan: Continue to engage patient in RT group sessions 2-3x/week.   Jeoffrey BRAVO Celestia, LRT, CTRS 10/21/2023 1:15 PM

## 2023-10-21 NOTE — Progress Notes (Signed)
 Pt is A&Ox4, denies SI, HI and AVH. Pt is irritable, he states that he is angry because staff spoke to him about having to shower due to poor hygiene and body odor. Pt states that he was already going to take a shower and its upset that people are saying he smells bad. Pt verbally deescalated, patient went to his room to calm down before returning to the milieu. Patient is calm and quiet in his room, no further outburst from patient, safety checks in place, pt is safe in his room.    10/21/23 0930  Psych Admission Type (Psych Patients Only)  Admission Status Voluntary  Psychosocial Assessment  Patient Complaints None  Eye Contact Fair  Facial Expression Anxious  Affect Irritable;Labile  Speech Logical/coherent  Interaction Assertive  Motor Activity Other (Comment) (WNL)  Appearance/Hygiene Body odor  Behavior Characteristics Cooperative  Mood Irritable  Thought Process  Coherency WDL  Content WDL  Delusions None reported or observed  Perception WDL  Hallucination None reported or observed  Judgment WDL  Confusion None  Danger to Self  Current suicidal ideation? Denies  Agreement Not to Harm Self Yes  Description of Agreement verbal  Danger to Others  Danger to Others None reported or observed

## 2023-10-22 LAB — VALPROIC ACID LEVEL: Valproic Acid Lvl: 82 ug/mL (ref 50–100)

## 2023-10-22 NOTE — Progress Notes (Signed)
  [facility] Adult Case Management Discharge Plan :  Will you be returning to the same living situation after discharge:  Yes,  pt reports that he is returning home.  At discharge, do you have transportation home?: Yes,  pt reports that mother will provide transportation. Do you have the ability to pay for your medications: No.  Release of information consent forms completed and in the chart;  Patient's signature needed at discharge.  Patient to Follow up at:  Follow-up Information     [facility] Follow up.   Why: Appointment is scheduled for on 7/18 at 8:30 via the phone and will receive a call at [phone] Contact information: [address]  [address] [name] [address] [phone]                 Next level of care provider has access to [facility] Link:no  Safety Planning and Suicide Prevention discussed: Yes,  SPE completed with the patient's mother.      Has patient been referred to the Quitline?: Patient does not use tobacco/nicotine  products  Patient has been referred for addiction treatment: Patient refused referral for treatment.  Sherryle JINNY Margo, LCSW 10/22/2023, 10:03 AM

## 2023-10-22 NOTE — Progress Notes (Signed)
   10/22/23 0800  Psych Admission Type (Psych Patients Only)  Admission Status Voluntary  Psychosocial Assessment  Patient Complaints None  Eye Contact Fair  Facial Expression Anxious  Affect Appropriate to circumstance  Speech Logical/coherent  Interaction Assertive  Motor Activity Other (Comment) (WNL)  Appearance/Hygiene Unremarkable  Behavior Characteristics Cooperative  Mood Pleasant  Thought Process  Coherency WDL  Content WDL  Delusions None reported or observed  Perception WDL  Hallucination None reported or observed  Judgment WDL  Confusion None  Danger to Self  Current suicidal ideation? Denies  Agreement Not to Harm Self Yes  Description of Agreement verbal  Danger to Others  Danger to Others None reported or observed

## 2023-10-22 NOTE — BHH Counselor (Signed)
 CSW spoke with the patient's mother, [name], [phone].  Mother reports that she has been made aware of patient's discharge and she DISAGREES.    She reports that I have found over 100 vials of THC.  Actual weed, hemp with buds, a glass pipe with a fancy lighter.  She reports concerns about the patient not taking his medications and not following up with therapy recommendations at discharge.  She reports that patient should not be discharged until he does the recommendations.  CSW attempted to provide psycho-education, however, pts mother was unable to process this.   She has requested to speak with provider and CSW informed that she will reach out to providers and make them aware of the request as well as reiterate the substances mother has found.  CSW has sent a secure chat.  CSW informed that at this time it is unsure if the patients discharge will be disrupted.   CSW to follow up.  [name], MSW, LCSW 10/22/2023 9:41 AM

## 2023-10-22 NOTE — Plan of Care (Signed)
   Problem: Education: Goal: Emotional status will improve Outcome: Progressing Goal: Mental status will improve Outcome: Progressing

## 2023-10-22 NOTE — Group Note (Signed)
 Date:  10/22/2023 Time:  2:18 PM  Group Topic/Focus:  Goals Group:   The focus of this group is to help patients establish daily goals to achieve during treatment and discuss how the patient can incorporate goal setting into their daily lives to aide in recovery.    Participation Level:  Active  Participation Quality:  Appropriate  Affect:  Appropriate  Cognitive:  Alert  Insight: Appropriate  Engagement in Group:  Engaged  Modes of Intervention:  Activity, Discussion, and Education  Additional Comments:    Skippy LITTIE Bennett 10/22/2023, 2:18 PM

## 2023-10-22 NOTE — Progress Notes (Signed)
 [facility] Discharge Suicide Risk Assessment   Principal Problem: Schizoaffective disorder [facility]) Discharge Diagnoses: Principal Problem:   Schizoaffective disorder [facility])  Patient is a 43 year old male who presented to the ED on 6/30 due to concerns about acute anxiety.  He has a psychiatric history of schizoaffective disorder bipolar type, GAD, PTSD, cannabis use disorder, and benzodiazepine use disorder.  There is no significant medical history reported.  Patient was recently discharged from [facility] on 09/02/2023.  He reports he did not follow-up following being discharged from the hospital and that he has been partially compliant with his medications reporting that he has been taking the Depakote .  He gives varying conflicting reports for his partial medication compliance.  He reports he presented following watching TV and developing anxiety indicated that he had a panic attack because the show he was watching had someone die who was murdered and reminded him of murder of his brother.  Also noted that he had some anxiety about his grandfather's passing.  He denied that he ever experienced suicidal ideations or hallucinations of any modality.  He noted reduced sleep.  He noted stable appetite.  He demonstrates poor insight into the need for outpatient follow-up medication compliance.  He is easily irritable and comes agitated and walks out of the interview.  Interview was attempted multiple times given patient's lability.  He is delusional at times talking about how he went to pharmacy school and then how he went to law school and then discusses how he knows everything about medications.  He is not observed to be responding to internal stimuli.  He denies SI, HI, and AVH.  He has had multiple admissions this year.  Discussed ACT services with patient did not give a response.  Reports his previous provider was Dr. Joshua with [facility] but he has not seen them for at least 6 months.  Multiple times during the interview  he request Ativan .  He currently lives with his mother and reports he chooses not to get disability later stating that he was denied disability.  He makes bizarre comments noting that he does not believe in females and they are not a part of his religion he then is asked to clarify he states that he believes him being alone.  He is confrontational and argumentative throughout the interview.  Patient requires inpatient psychiatric hospitalization for medication management and stabilization due to severely impaired judgment.  Uses chewing tobacco a can a day     Total Time spent with patient: 30 minutes  Musculoskeletal: Strength & Muscle Tone: within normal limits Gait & Station: normal   Psychiatric Specialty Exam  Presentation  General Appearance:  Appropriate for Environment  Eye Contact: Good  Speech: Normal Rate  Speech Volume: Normal  Handedness: Right   Mood and Affect  Mood: Euthymic  Duration of Depression Symptoms: Less than two weeks  Affect: Flat   Thought Process  Thought Processes: Linear  Descriptions of Associations:Intact  Orientation:Full (Time, Place and Person)  Thought Content:Logical  History of Schizophrenia/Schizoaffective disorder:No  Duration of Psychotic Symptoms:N/A  Hallucinations:Hallucinations: None  Ideas of Reference:None  Suicidal Thoughts:Suicidal Thoughts: No  Homicidal Thoughts:Homicidal Thoughts: No   Sensorium  Memory: Immediate Good; Recent Good  Judgment: Fair  Insight: Fair   Art therapist  Concentration: Fair  Attention Span: Good  Recall: Good  Fund of Knowledge: Good  Language: Good   Psychomotor Activity  Psychomotor Activity:No data recorded  Assets  Assets: Communication Skills; Financial Resources/Insurance; Physical Health; Housing; Social Support  Sleep  Sleep: Sleep: Good  Estimated Sleeping Duration (Last 24 Hours): 8.50-9.75 hours  Physical  Exam: Physical Exam ROS Blood pressure 110/74, pulse 70, temperature 98.1 F (36.7 C), resp. rate 20, height 5' 11 (1.803 m), weight 78.5 kg, SpO2 98%. Body mass index is 24.13 kg/m.  Mental Status Per Nursing Assessment::   On Admission:  NA  Demographic Factors:  Male and Caucasian  Loss Factors: NA  Historical Factors: NA  Risk Reduction Factors:   Living with another person, especially a relative, Positive social support, Positive therapeutic relationship, and Positive coping skills or problem solving skills  Continued Clinical Symptoms:  Previous Psychiatric Diagnoses and Treatments  Cognitive Features That Contribute To Risk:  None    Suicide Risk:  Mild:  Suicidal ideation of limited frequency, intensity, duration, and specificity.  There are no identifiable plans, no associated intent, mild dysphoria and related symptoms, good self-control (both objective and subjective assessment), few other risk factors, and identifiable protective factors, including available and accessible social support.   Follow-up Information     [facility] Follow up.   Why: Appointment is scheduled for on 7/18 at 8:30 via the phone and will receive a call at [phone] Contact information: [address]  [address] [name] [address] [phone]                 Hoy CHRISTELLA Pinal, NP 10/22/2023, 11:47 AM

## 2023-10-22 NOTE — Progress Notes (Signed)
 Patient ID: Ronald [name], male   DOB: January 16, 1981, 43 y.o.   MRN: [id number]  Medications, follow up appointments and discharge instructions reviewed, pt verbalized understanding. Belongings returned to patient, pt satisfied with belongings, pt dc home.

## 2023-10-22 NOTE — BHH Counselor (Signed)
 CSW met with the patient to discus ACT referral.  CSW provided psycho-education on ACT teams.   Patient declined ACT services.   CSW spoke with the patient on the importance of taking his medications and following up with therapy recommendations.  Patient reports that he will.  Patient asked if aftercare provider will be able to refill medications before the month was up or if he needed to speak with his PCP Dr. Joshua on the medications.  CSW informed that appointment is on 10/31/2023 and pt can ask then, so he has the opportunity to speak with Dr. Joshua if needed.   [name], MSW, LCSW 10/22/2023 11:36 AM

## 2023-10-22 NOTE — Plan of Care (Signed)
  Problem: Education: Goal: Emotional status will improve Outcome: Progressing Goal: Verbalization of understanding the information provided will improve Outcome: Progressing   Problem: Coping: Goal: Ability to verbalize frustrations and anger appropriately will improve Outcome: Progressing Goal: Ability to demonstrate self-control will improve Outcome: Progressing   

## 2023-10-22 NOTE — Plan of Care (Signed)
  Problem: Education: Goal: Emotional status will improve 10/22/2023 1404 by Deidre Sartorius, RN Outcome: Completed/Met 10/22/2023 0904 by Deidre Sartorius, RN Outcome: Progressing Goal: Mental status will improve Outcome: Completed/Met Goal: Verbalization of understanding the information provided will improve 10/22/2023 1404 by Deidre Sartorius, RN Outcome: Completed/Met 10/22/2023 0904 by Deidre Sartorius, RN Outcome: Progressing   Problem: Activity: Goal: Interest or engagement in activities will improve Outcome: Completed/Met Goal: Sleeping patterns will improve Outcome: Completed/Met   Problem: Coping: Goal: Ability to verbalize frustrations and anger appropriately will improve 10/22/2023 1404 by Deidre Sartorius, RN Outcome: Completed/Met 10/22/2023 0904 by Deidre Sartorius, RN Outcome: Progressing Goal: Ability to demonstrate self-control will improve 10/22/2023 1404 by Deidre Sartorius, RN Outcome: Completed/Met 10/22/2023 0904 by Deidre Sartorius, RN Outcome: Progressing
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
## 2023-10-23 NOTE — BHH Counselor (Signed)
 CSW notes note is entered after discharge, CSW forgot to submit.  CSW assisted physician in completing a family meeting with the patient's mother.   It was reviewed with the mother that the patient is his own guardian and at this time is allowed to make his own decisions.  CSW informed that patient has an declined face to face appointment and has requested a phone/virtual appointment; patient has also declined ACT team referrals.  Mother expressed frustration at this, stating that patient would not follow up.  It was reiterated that patient can make his own decisions.  CSW pointed out that patient has stated several times plans to follow up with recommended outpatient provider as well as his PCP.   It was reviewed that patient's mother can pursue guardianship if she continues to have concerns.  Mother reports that she has talked to a lawyer and will follow up with them.    It was reviewed that APS report was made, however, the [facility] Department of Social Services has chosen to unsubstantiated the case.  CSW informed that per DSS the case was referred to law enforcement.   Mother reported that she has thrown out the patient's reported THC and hemp buds found in patient's room.  CSW expressed that patient may be upset at this and mother is encouraged to contact law enforcement and emergency services if needed. Mother reports that she is not concerned that patient will harm her, however, acknowledged that these services were available.   [name], MSW, LCSW 10/23/2023 8:40 AM

## 2023-11-03 ENCOUNTER — Other Ambulatory Visit: Payer: Self-pay | Admitting: Internal Medicine

## 2023-11-03 DIAGNOSIS — F250 Schizoaffective disorder, bipolar type: Secondary | ICD-10-CM

## 2023-11-03 NOTE — Telephone Encounter (Unsigned)
 Copied from CRM [id number]. Topic: Clinical - Medication Refill >> Nov 03, 2023 12:43 PM [name] wrote: Medication: gabapentin  (NEURONTIN ) 100 MG capsule [[id number]] & haloperidol  (HALDOL ) 10 MG tablet [[id number]] &  divalproex  (DEPAKOTE  ER) 500 MG 24 hr tablet [[id number]]  Has the patient contacted their pharmacy? No (Agent: If no, request that the patient contact the pharmacy for the refill. If patient does not wish to contact the pharmacy document the reason why and proceed with request.) (Agent: If yes, when and what did the pharmacy advise?)  This is the patient's preferred pharmacy:  [facility] [address] [name] - [address] [name] [address] Phone: [phone] Fax: [phone]  Is this the correct pharmacy for this prescription? Yes If no, delete pharmacy and type the correct one.   Has the prescription been filled recently? No  Is the patient out of the medication? No  Has the patient been seen for an appointment in the last year OR does the patient have an upcoming appointment? Yes, patient would like a refill to hold him over until his next appointment on 11/25/2023  Can we respond through MyChart? No  Agent: Please be advised that Rx refills may take up to 3 business days. We ask that you follow-up with your pharmacy.

## 2023-11-20 ENCOUNTER — Other Ambulatory Visit: Payer: Self-pay | Admitting: Internal Medicine

## 2023-11-20 NOTE — Telephone Encounter (Signed)
 Copied from CRM [id number]. Topic: Clinical - Medication Refill >> Nov 20, 2023  3:54 PM [name] wrote: Medication: divalproex  (DEPAKOTE  ER) 500 MG 24 hr tablet [[id number]]  Has the patient contacted their pharmacy? Yes, advised patient to contact his provider. (Agent: If no, request that the patient contact the pharmacy for the refill. If patient does not wish to contact the pharmacy document the reason why and proceed with request.) (Agent: If yes, when and what did the pharmacy advise?)  This is the patient's preferred pharmacy:  [facility] [address], [name] - [address] [name] [address] Phone: [phone] Fax: [phone]  Is this the correct pharmacy for this prescription? Yes If no, delete pharmacy and type the correct one.   Has the prescription been filled recently? No  Is the patient out of the medication? No, he has two days worth but does not want to run out before the weekend.   Has the patient been seen for an appointment in the last year OR does the patient have an upcoming appointment? Yes  Can we respond through MyChart? Yes  Agent: Please be advised that Rx refills may take up to 3 business days. We ask that you follow-up with your pharmacy.

## 2023-11-24 NOTE — Telephone Encounter (Signed)
 Pt needs an appointment as he was not seen in a year, meds denied until appointment

## 2023-11-24 NOTE — Telephone Encounter (Signed)
 Copied from CRM #[id number]. Topic: Clinical - Medication Question >> Nov 24, 2023 12:16 PM Aisha D wrote: Reason for CRM: Pt stated that he submitted a medication refill for the divalproex  (DEPAKOTE  ER) 500 MG 24 hr tablet. Pt stated that he is out of the medication and needs it refilled today and also needs to have a refill for the trazodone  100mg . Pt was requesting to speak with Dr.Jones now or his nurse, I informed the pt that they both were currently with other patients. Pt stated that he needs to speak with then now and the other pt's can wait. I informed the pt that they were not able to come to the phone but I could send them a message so once available someone could reach out. Pt then stated that he is having an anxiety attack and to reschedule his appt. Pt disconnected the call, tried to call the pt back twice no answer. Please call the pt back today if possible.

## 2023-11-24 NOTE — Telephone Encounter (Signed)
 Called pt and advised we cannot send in any medication as he has not been seen in a year, this will need to wait until his appt tomorrow. Pt verbalized understanding and says he will see us  tomorrow for appt

## 2023-11-24 NOTE — Telephone Encounter (Unsigned)
 Copied from CRM #[id number]. Topic: Clinical - Prescription Issue >> Nov 24, 2023 12:16 PM [name] wrote: Reason for CRM: Patient is having issues refilling his Trazodone  50 MG, gabapentin  (NEURONTIN ) 100 MG capsule [[id number]] & divalproex  (DEPAKOTE  ER) 500 MG 24 hr tablet [[id number]] and would like for them to be filled today, I advised that it shows Ronald [name] as the ordering prescriber but he states Dr.Jones is the prescriber.

## 2023-11-24 NOTE — Telephone Encounter (Signed)
 Reason for CRM: Pt called  he is out of medicine, he says he has his attorney on the line, he is out of medicine, he called friday, pt says  he needs it in his systemdivalproex (DEPAKOTE  ER) 500 MGgabapentin 100mg  , traszodone 100MG   (I did not see on list) ,pt has an at for 8/12 please call pt back ASAP  CALLBACK # [phone]     CRM sent to incorrect office [facility]). Patient's PCP is Dr. Madeleine Molt at [facility], Patient Access Advocate  [facility]

## 2023-11-25 ENCOUNTER — Ambulatory Visit: Payer: Self-pay | Admitting: Internal Medicine

## 2023-11-25 ENCOUNTER — Ambulatory Visit: Payer: Self-pay

## 2023-11-25 ENCOUNTER — Other Ambulatory Visit: Payer: Self-pay | Admitting: Internal Medicine

## 2023-11-25 DIAGNOSIS — F250 Schizoaffective disorder, bipolar type: Secondary | ICD-10-CM

## 2023-11-25 NOTE — Telephone Encounter (Signed)
 Patient response to PAS for refill TAT:   Patient is out of medication and really needs this filled today, he says he's disabled and this is his HIPAA rights and really needs medication today. Please call him when medication is sent to the pharmacy   Note also requesting Trazodone  that is not on current medication list

## 2023-11-25 NOTE — Telephone Encounter (Signed)
 Copied from CRM [id number]. Topic: Clinical - Medication Refill >> Nov 25, 2023 11:07 AM Viola F wrote: Medication: gabapentin  (NEURONTIN ) 100 MG capsule [[id number]]  divalproex  (DEPAKOTE  ER) 1000 MG 24 hr tablet [[id number]]  traZODone  (DESYREL ) 100 MG tablet [[id number]]  DISCONTINUED  Has the patient contacted their pharmacy? Yes (Agent: If no, request that the patient contact the pharmacy for the refill. If patient does not wish to contact the pharmacy document the reason why and proceed with request.) (Agent: If yes, when and what did the pharmacy advise?)  This is the patient's preferred pharmacy:  [facility] [address], [name] - [address] [name] [address] Phone: [phone] Fax: [phone]  Is this the correct pharmacy for this prescription? Yes If no, delete pharmacy and type the correct one.   Has the prescription been filled recently? Yes  Is the patient out of the medication? Yes  Has the patient been seen for an appointment in the last year OR does the patient have an upcoming appointment? Yes  Can we respond through MyChart? Yes  Agent: Please be advised that Rx refills may take up to 3 business days. We ask that you follow-up with your pharmacy.

## 2023-11-25 NOTE — Telephone Encounter (Signed)
 Copied from CRM [id number]. Topic: Clinical - Red Word Triage >> Nov 25, 2023 10:17 AM Rosina BIRCH wrote: Red Word that prompted transfer to Nurse Triage: patient called stating he was having anxiety and wanted his medication called in   Left message to call back. Need to know which medication he needs refilled.

## 2023-11-25 NOTE — Telephone Encounter (Signed)
**  PLEASE SEE OTHER ENCOUNTERS**  Pt was informed yesterday that he needs appt for ANY medications to be sent in as he has not been seen in a year. He was advised he needed to come to his appt today for any medication to be sent so NO medication until seen.

## 2023-11-25 NOTE — Telephone Encounter (Signed)
 Please see other encounters, already spoke w pt yesterday and advised him no medication can be sent in until he is seen today as he has not been seen in a year

## 2023-11-25 NOTE — Telephone Encounter (Unsigned)
 Copied from CRM #[id number]. Topic: Clinical - Medication Question >> Nov 24, 2023 12:13 PM Frederich PARAS wrote: Reason for CRM: pt called in to get a fill on traszodone 100MG   but the medication is not on the list at all.Is  the patient supposed to have this medication for they are requesting it and I do not see it on the medication list on the patient chart >> Nov 24, 2023  1:18 PM CMA Chloe C wrote: Sent to incorrect office.

## 2023-11-25 NOTE — Telephone Encounter (Addendum)
 Second attempt; no answer Third attempt; no answer  Copied from CRM [id number]. Topic: Clinical - Red Word Triage >> Nov 25, 2023 10:17 AM Rosina BIRCH wrote: Red Word that prompted transfer to Nurse Triage: patient called stating he was having anxiety and wanted his medication called in   Left message to call back. Need to know which medication he needs refilled.

## 2023-11-27 ENCOUNTER — Telehealth (HOSPITAL_COMMUNITY): Payer: Self-pay | Admitting: *Deleted

## 2023-11-27 NOTE — Telephone Encounter (Signed)
 Pharmacy request from [facility] for a new rx for Haldol  10 mg. Reviewed chart and not seen since April, no showed for last appt and has no future appt scheduled. Looks to have recently been on the inpatient unit. Will forward request to front desk to reach out to him with a follow up appt here. Appears his last haldol  rx was written by the admitting and discharging Dr at [facility].

## 2023-12-01 ENCOUNTER — Telehealth: Payer: Self-pay | Admitting: Internal Medicine

## 2023-12-01 DIAGNOSIS — F250 Schizoaffective disorder, bipolar type: Secondary | ICD-10-CM
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
## 2023-12-01 NOTE — Telephone Encounter (Signed)
 Please advise. Is there anything that we can do to help his mom ?

## 2023-12-01 NOTE — Telephone Encounter (Signed)
 I have advised his mother to call 911 she gave a verbal understanding.

## 2023-12-02 NOTE — Addendum Note (Signed)
 Addended by: Hades Mathew E on: 12/02/2023 01:02 PM   Modules accepted: Orders

## 2023-12-02 NOTE — Telephone Encounter (Unsigned)
 Copied from CRM [id number]. Topic: Clinical - Medication Refill >> Dec 02, 2023 12:25 PM Roselie BROCKS wrote: Medication: divalproex  (DEPAKOTE ) 250 MG DR tablet,gabapentin  (NEURONTIN ) 100 MG capsule gabapentin  (NEURONTIN ) 100 MG capsule  Patient stats the Divalproex  Depakote  should be 1000 mg instead of 250   Has the patient contacted their pharmacy? Yes (Agent: If no, request that the patient contact the pharmacy for the refill. If patient does not wish to contact the pharmacy document the reason why and proceed with request.) (Agent: If yes, when and what did the pharmacy advise?)  This is the patient's preferred pharmacy:  [facility] [address], [name] - [address] [place] [address] [name] [address] Phone: [phone] Fax: [phone]  Is this the correct pharmacy for this prescription? Yes If no, delete pharmacy and type the correct one.   Has the prescription been filled recently? Yes  Is the patient out of the medication? Yes  Has the patient been seen for an appointment in the last year OR does the patient have an upcoming appointment? Yes  Can we respond through MyChart? No  Agent: Please be advised that Rx refills may take up to 3 business days. We ask that you follow-up with your pharmacy.

## 2023-12-02 NOTE — Addendum Note (Signed)
 Addended by: ULLA AQUAS A on: 12/02/2023 12:38 PM   Modules accepted: Orders

## 2023-12-02 NOTE — Telephone Encounter (Signed)
 NO MEDS UNTIL SEEN. This has been addressed to pt and documented multiple times, please look into chart before sending further messages.

## 2023-12-03 ENCOUNTER — Telehealth: Payer: Self-pay | Admitting: Internal Medicine

## 2023-12-03 NOTE — Telephone Encounter (Signed)
 Forms left up front in Dr. Joshua box from this patient

## 2023-12-04 ENCOUNTER — Telehealth: Payer: Self-pay

## 2023-12-04 NOTE — Telephone Encounter (Signed)
 Copied from CRM #[id number]. Topic: Clinical - Prescription Issue >> Dec 04, 2023 12:56 PM Tinnie BROCKS wrote: Reason for CRM: Mother Arlean (power of attorney, on DPR that was signed in office yesterday but not yet scanned) calling in to let us  know that they have not gotten a refill for Haldol . Appt made for 8/26. She is worried she will have to have him committed to psych inpatient. He is having extreme paranoia and won't leave house. She says this is the most important med for him. She cannot answer any calls because they live together and it would upset his paranoia if she was speaking with the office. She said it would be okay to call [phone] and leave a full voicemail, but she would not be able to answer. If refilled, it would be okay to send a notice through mychart, but he can't know that she called. She says she has already spoken with a nurse about his symptoms.

## 2023-12-04 NOTE — Telephone Encounter (Signed)
 Forms received. Given to Dr. Joshua for review will be given back to the front to have scanned in.

## 2023-12-08 ENCOUNTER — Other Ambulatory Visit: Payer: Self-pay

## 2023-12-08 MED ORDER — HALOPERIDOL 10 MG PO TABS: 10.0000 mg | ORAL_TABLET | Freq: Every day | ORAL | 0 refills | Status: DC

## 2023-12-08 NOTE — Telephone Encounter (Signed)
 Medication refill request has been sent to Dr. Joshua. Ronald [name] has been made aware that the medication refill request has been sent but it is not a guarantee that he will refill this medication as this medication is under another prescribers care.

## 2023-12-09 ENCOUNTER — Ambulatory Visit: Payer: Self-pay | Admitting: Internal Medicine

## 2023-12-26 ENCOUNTER — Other Ambulatory Visit: Payer: Self-pay

## 2023-12-26 ENCOUNTER — Emergency Department (HOSPITAL_COMMUNITY)
Admission: EM | Admit: 2023-12-26 | Discharge: 2023-12-27 | Disposition: A | Discharge status: Home / Self Care | Payer: 59 | Attending: Emergency Medicine | Admitting: Emergency Medicine

## 2023-12-26 DIAGNOSIS — Z79899 Other long term (current) drug therapy: Secondary | ICD-10-CM | POA: Insufficient documentation

## 2023-12-26 DIAGNOSIS — F4310 Post-traumatic stress disorder, unspecified: Secondary | ICD-10-CM | POA: Diagnosis present

## 2023-12-26 DIAGNOSIS — F250 Schizoaffective disorder, bipolar type: Secondary | ICD-10-CM | POA: Diagnosis present

## 2023-12-26 DIAGNOSIS — F411 Generalized anxiety disorder: Secondary | ICD-10-CM | POA: Diagnosis present

## 2023-12-26 DIAGNOSIS — F319 Bipolar disorder, unspecified: Secondary | ICD-10-CM | POA: Diagnosis present

## 2023-12-26 LAB — COMPREHENSIVE METABOLIC PANEL WITH GFR
ALT: 11 U/L (ref 0–44)
AST: 12 U/L — ABNORMAL LOW (ref 15–41)
Albumin: 3.9 g/dL (ref 3.5–5.0)
Alkaline Phosphatase: 41 U/L (ref 38–126)
Anion gap: 10 (ref 5–15)
BUN: 12 mg/dL (ref 6–20)
CO2: 25 mmol/L (ref 22–32)
Calcium: 9 mg/dL (ref 8.9–10.3)
Chloride: 104 mmol/L (ref 98–111)
Creatinine, Ser: 0.77 mg/dL (ref 0.61–1.24)
GFR, Estimated: >60 mL/min
Glucose, Bld: 107 mg/dL — ABNORMAL HIGH (ref 70–99)
Potassium: 4.2 mmol/L (ref 3.5–5.1)
Sodium: 139 mmol/L (ref 135–145)
Total Bilirubin: 0.2 mg/dL (ref 0.0–1.2)
Total Protein: 5.6 g/dL — ABNORMAL LOW (ref 6.5–8.1)

## 2023-12-26 LAB — CBC WITH DIFFERENTIAL/PLATELET
Abs Immature Granulocytes: 0 K/uL (ref 0.00–0.07)
Basophils Absolute: 0 K/uL (ref 0.0–0.1)
Basophils Relative: 1 %
Eosinophils Absolute: 0 K/uL (ref 0.0–0.5)
Eosinophils Relative: 0 %
HCT: 39.6 % (ref 39.0–52.0)
Hemoglobin: 13.5 g/dL (ref 13.0–17.0)
Immature Granulocytes: 0 %
Lymphocytes Relative: 41 %
Lymphs Abs: 2 K/uL (ref 0.7–4.0)
MCH: 30.8 pg (ref 26.0–34.0)
MCHC: 34.1 g/dL (ref 30.0–36.0)
MCV: 90.4 fL (ref 80.0–100.0)
Monocytes Absolute: 0.4 K/uL (ref 0.1–1.0)
Monocytes Relative: 8 %
Neutro Abs: 2.4 K/uL (ref 1.7–7.7)
Neutrophils Relative %: 50 %
Platelets: 183 K/uL (ref 150–400)
RBC: 4.38 MIL/uL (ref 4.22–5.81)
RDW: 13 % (ref 11.5–15.5)
WBC: 4.8 K/uL (ref 4.0–10.5)
nRBC: 0 % (ref 0.0–0.2)

## 2023-12-26 LAB — VALPROIC ACID LEVEL: Valproic Acid Lvl: 101 ug/mL — ABNORMAL HIGH (ref 50–100)

## 2023-12-26 LAB — ETHANOL: Alcohol, Ethyl (B): <15 mg/dL (ref <15)

## 2023-12-26 MED ORDER — DIVALPROEX SODIUM ER 500 MG PO TB24
1000.0000 mg | ORAL_TABLET | Freq: Two times a day (BID) | ORAL | Status: DC
  Administered 2023-12-27 (×2): 1000 mg via ORAL
  Filled 2023-12-26 (×2): qty 2

## 2023-12-26 MED ORDER — GABAPENTIN 100 MG PO CAPS
200.0000 mg | ORAL_CAPSULE | Freq: Three times a day (TID) | ORAL | Status: DC
  Administered 2023-12-27 (×2): 200 mg via ORAL
  Filled 2023-12-26 (×2): qty 2

## 2023-12-26 MED ORDER — TRAZODONE HCL 50 MG PO TABS
150.0000 mg | ORAL_TABLET | Freq: Every day | ORAL | Status: DC
  Administered 2023-12-27: 150 mg via ORAL
  Filled 2023-12-26: qty 1

## 2023-12-26 MED ORDER — BENZTROPINE MESYLATE 1 MG PO TABS: 1.0000 mg | ORAL_TABLET | Freq: Two times a day (BID) | ORAL | Status: DC | PRN

## 2023-12-26 MED ORDER — NICOTINE 21 MG/24HR TD PT24
21.0000 mg | MEDICATED_PATCH | Freq: Every day | TRANSDERMAL | Status: DC
  Administered 2023-12-27: 21 mg via TRANSDERMAL
  Filled 2023-12-26: qty 1

## 2023-12-26 MED ORDER — HALOPERIDOL 5 MG PO TABS
10.0000 mg | ORAL_TABLET | Freq: Every day | ORAL | Status: DC
  Administered 2023-12-27: 10 mg via ORAL
  Filled 2023-12-26: qty 2

## 2023-12-26 NOTE — ED Triage Notes (Addendum)
 Patient brought in by PD in handcuffs. Per IVC paperwork patient has a hx of schizophrenia, PTSD and bipolar disorder. Reported that patient is not tending to his hygiene, wearing the same clothes for 3 weeks and has not showered. Patient reported he ate today and showered before coming here. IVC paperwork states that patient refuses to exit the house and is not following up with his dr appointments. Hx IVC and  paperwork states he talks to himself, believes god is talking to him, aliens are talking to him, isolates himself in bedroom. Per paperwork patient believes he is making new DNA, he is smoking hemp, thc and is abusing nicotine  pouches. Told the petitioner god is sending her to hell for blasphamy. Refer to IVC paperwork provided. Patient denies SI/HI.

## 2023-12-26 NOTE — ED Provider Notes (Signed)
 [place] EMERGENCY DEPARTMENT AT [facility] Provider Note   CSN: [id number] Arrival date & time: 12/26/23  2134     Patient presents with: Psychiatric Evaluation   Ronald [name] is a 43 y.o. male.   Pt is a 43 yo male with pmhx significant for schizophrenia, bipolar d/o, and arthritis.  Pt comes in today with IVC papers taken out by his mother. IVC papers state that pt has not been taking care of himself.  He has not been showering or changing his clothes.  He's been paranoid.  Pt said he was running low on his Depakote , so was only taking half the dose until the last 2 days.  Now, he has the full dose.  Pt denies si.  He's cooperative now.       Prior to Admission medications   Medication Sig Start Date End Date Taking? Authorizing Provider  traZODone  (DESYREL ) 50 MG tablet Take 50 mg by mouth at bedtime. 12/20/23  Yes [provider]  benztropine  (COGENTIN ) 1 MG tablet Take 1 tablet (1 mg total) by mouth 2 (two) times daily as needed (with haldol  during agitation protocol not to exceed 2 mg daily). 10/21/23   Cleotilde Hoy HERO, NP  divalproex  (DEPAKOTE  ER) 500 MG 24 hr tablet Take 2 tablets (1,000 mg total) by mouth 2 (two) times daily. 10/21/23   Cleotilde Hoy HERO, NP  gabapentin  (NEURONTIN ) 100 MG capsule Take 2 capsules (200 mg total) by mouth 3 (three) times daily. 10/21/23   Cleotilde Hoy HERO, NP  haloperidol  (HALDOL ) 10 MG tablet Take 1 tablet (10 mg total) by mouth at bedtime. 12/08/23   Joshua Debby CROME, MD    Allergies: Hydroxyzine , Alupent [metaproterenol], Benadryl [diphenhydramine], Klonopin  [clonazepam ], Other, Quetiapine , and Zyprexa  [olanzapine ]    Review of Systems  Psychiatric/Behavioral:  Positive for hallucinations.   All other systems reviewed and are negative.   Updated Vital Signs BP 131/85 (BP Location: Right Arm)   Pulse 69   Temp 98.1 F (36.7 C) (Oral)   Resp 18   SpO2 99%   Physical Exam Vitals and nursing note reviewed.   Constitutional:      Appearance: Normal appearance.  HENT:     Head: Normocephalic and atraumatic.     Right Ear: External ear normal.     Left Ear: External ear normal.     Nose: Nose normal.     Mouth/Throat:     Mouth: Mucous membranes are moist.     Pharynx: Oropharynx is clear.  Eyes:     Extraocular Movements: Extraocular movements intact.     Conjunctiva/sclera: Conjunctivae normal.     Pupils: Pupils are equal, round, and reactive to light.  Cardiovascular:     Rate and Rhythm: Normal rate and regular rhythm.     Pulses: Normal pulses.     Heart sounds: Normal heart sounds.  Pulmonary:     Effort: Pulmonary effort is normal.     Breath sounds: Normal breath sounds.  Abdominal:     General: Abdomen is flat. Bowel sounds are normal.     Palpations: Abdomen is soft.  Musculoskeletal:        General: Normal range of motion.     Cervical back: Normal range of motion and neck supple.  Skin:    General: Skin is warm.     Capillary Refill: Capillary refill takes less than 2 seconds.  Neurological:     General: No focal deficit present.     Mental  Status: He is alert and oriented to person, place, and time.  Psychiatric:        Mood and Affect: Mood normal.        Behavior: Behavior normal.     (all labs ordered are listed, but only abnormal results are displayed) Labs Reviewed  COMPREHENSIVE METABOLIC PANEL WITH GFR - Abnormal; Notable for the following components:      Result Value   Glucose, Bld 107 (*)    Total Protein 5.6 (*)    AST 12 (*)    All other components within normal limits  VALPROIC ACID  LEVEL - Abnormal; Notable for the following components:   Valproic Acid  Lvl 101 (*)    All other components within normal limits  ETHANOL  CBC WITH DIFFERENTIAL/PLATELET  URINE DRUG SCREEN    EKG: None  Radiology: No results found.   Procedures   Medications Ordered in the ED  nicotine  (NICODERM CQ  - dosed in mg/24 hours) patch 21 mg (has no  administration in time range)  benztropine  (COGENTIN ) tablet 1 mg (has no administration in time range)  divalproex  (DEPAKOTE  ER) 24 hr tablet 1,000 mg (has no administration in time range)  gabapentin  (NEURONTIN ) capsule 200 mg (has no administration in time range)  haloperidol  (HALDOL ) tablet 10 mg (has no administration in time range)  traZODone  (DESYREL ) tablet 150 mg (has no administration in time range)                                    Medical Decision Making Amount and/or Complexity of Data Reviewed Labs: ordered.  Risk OTC drugs. Prescription drug management.   This patient presents to the ED for concern of ivc, this involves an extensive number of treatment options, and is a complaint that carries with it a high risk of complications and morbidity.  The differential diagnosis includes psychosis, med noncompliance   Co morbidities that complicate the patient evaluation  chizophrenia, bipolar d/o, and arthritis   Additional history obtained:  Additional history obtained from epic chart review External records from outside source obtained and reviewed including police   Lab Tests:  I Ordered, and personally interpreted labs.  The pertinent results include:  cbc nl, cmp nl, depakote  level 101; etoh neg   Medicines ordered and prescription drug management:  I ordered medication including home meds  for sx  Reevaluation of the patient after these medicines showed that the patient improved I have reviewed the patients home medicines and have made adjustments as needed  Critical Interventions:  TTS eval   Consultations Obtained:  I requested consultation with TTS,  and discussed lab and imaging findings as well as pertinent plan - consult pending   Problem List / ED Course:  Psychosis:  TTS consult placed.  Home meds placed.  Pt said he's not been taking the haldol  at home, so that may be why he's had a deterioration in his mental  health.   Reevaluation:  After the interventions noted above, I reevaluated the patient and found that they have :improved   Social Determinants of Health:  Lives at home   Dispostion: Pending TTS consult     Final diagnoses:  Schizoaffective disorder, bipolar type [facility])    ED Discharge Orders     None          Dean Clarity, MD 12/26/23 2350

## 2023-12-26 NOTE — ED Notes (Signed)
 Pt was changed into burgundy scrubs and non-skid socks. Pt belongings are located in cabinet above nursing station for rooms 16[address]22.

## 2023-12-27 ENCOUNTER — Inpatient Hospital Stay (HOSPITAL_COMMUNITY)
Admission: AD | Admit: 2023-12-27 | Discharge: 2024-01-04 | DRG: 885 | Disposition: A | Discharge status: Home / Self Care | Source: Intra-hospital

## 2023-12-27 ENCOUNTER — Encounter (HOSPITAL_COMMUNITY): Payer: Self-pay | Admitting: Psychiatry

## 2023-12-27 DIAGNOSIS — Z818 Family history of other mental and behavioral disorders: Secondary | ICD-10-CM | POA: Diagnosis not present

## 2023-12-27 DIAGNOSIS — F1210 Cannabis abuse, uncomplicated: Secondary | ICD-10-CM | POA: Diagnosis present

## 2023-12-27 DIAGNOSIS — M1990 Unspecified osteoarthritis, unspecified site: Secondary | ICD-10-CM | POA: Diagnosis present

## 2023-12-27 DIAGNOSIS — Z888 Allergy status to other drugs, medicaments and biological substances status: Secondary | ICD-10-CM | POA: Diagnosis not present

## 2023-12-27 DIAGNOSIS — F4310 Post-traumatic stress disorder, unspecified: Secondary | ICD-10-CM | POA: Diagnosis present

## 2023-12-27 DIAGNOSIS — F17220 Nicotine dependence, chewing tobacco, uncomplicated: Secondary | ICD-10-CM | POA: Diagnosis present

## 2023-12-27 DIAGNOSIS — F319 Bipolar disorder, unspecified: Secondary | ICD-10-CM | POA: Diagnosis not present

## 2023-12-27 DIAGNOSIS — F209 Schizophrenia, unspecified: Secondary | ICD-10-CM | POA: Diagnosis not present

## 2023-12-27 DIAGNOSIS — F1310 Sedative, hypnotic or anxiolytic abuse, uncomplicated: Secondary | ICD-10-CM | POA: Diagnosis present

## 2023-12-27 DIAGNOSIS — Z91148 Patient's other noncompliance with medication regimen for other reason: Secondary | ICD-10-CM

## 2023-12-27 DIAGNOSIS — F250 Schizoaffective disorder, bipolar type: Secondary | ICD-10-CM | POA: Diagnosis present

## 2023-12-27 DIAGNOSIS — Z79899 Other long term (current) drug therapy: Secondary | ICD-10-CM | POA: Diagnosis not present

## 2023-12-27 LAB — URINE DRUG SCREEN
Amphetamines: NEGATIVE
Barbiturates: NEGATIVE
Benzodiazepines: NEGATIVE
Cocaine: NEGATIVE
Fentanyl: NEGATIVE
Methadone Scn, Ur: NEGATIVE
Opiates: NEGATIVE
Tetrahydrocannabinol: POSITIVE — AB

## 2023-12-27 MED ORDER — BENZTROPINE MESYLATE 1 MG PO TABS
1.0000 mg | ORAL_TABLET | Freq: Two times a day (BID) | ORAL | Status: DC | PRN
  Filled 2023-12-27: qty 10

## 2023-12-27 MED ORDER — DIVALPROEX SODIUM ER 500 MG PO TB24
1000.0000 mg | ORAL_TABLET | Freq: Two times a day (BID) | ORAL | Status: DC
  Administered 2023-12-27 – 2023-12-28 (×2): 1000 mg via ORAL
  Filled 2023-12-27 (×2): qty 2

## 2023-12-27 MED ORDER — ALUM & MAG HYDROXIDE-SIMETH 200-200-20 MG/5ML PO SUSP: 30.0000 mL | ORAL | Status: DC | PRN

## 2023-12-27 MED ORDER — TRAZODONE HCL 150 MG PO TABS
150.0000 mg | ORAL_TABLET | Freq: Every day | ORAL | Status: DC
  Administered 2023-12-27: 150 mg via ORAL
  Filled 2023-12-27: qty 1

## 2023-12-27 MED ORDER — HALOPERIDOL 5 MG PO TABS
10.0000 mg | ORAL_TABLET | Freq: Every day | ORAL | Status: DC
  Administered 2023-12-27 – 2023-12-30 (×4): 10 mg via ORAL
  Filled 2023-12-27 (×4): qty 2

## 2023-12-27 MED ORDER — GABAPENTIN 100 MG PO CAPS
200.0000 mg | ORAL_CAPSULE | Freq: Three times a day (TID) | ORAL | Status: DC
  Administered 2023-12-27 – 2023-12-28 (×3): 200 mg via ORAL
  Filled 2023-12-27 (×3): qty 2

## 2023-12-27 MED ORDER — MAGNESIUM HYDROXIDE 400 MG/5ML PO SUSP: 30.0000 mL | Freq: Every day | ORAL | Status: DC | PRN

## 2023-12-27 MED ORDER — HALOPERIDOL LACTATE 5 MG/ML IJ SOLN: 5.0000 mg | Freq: Three times a day (TID) | INTRAMUSCULAR | Status: DC | PRN

## 2023-12-27 MED ORDER — ACETAMINOPHEN 325 MG PO TABS
650.0000 mg | ORAL_TABLET | Freq: Four times a day (QID) | ORAL | Status: DC | PRN
  Administered 2023-12-29 – 2024-01-03 (×3): 650 mg via ORAL
  Filled 2023-12-27 (×3): qty 2

## 2023-12-27 MED ORDER — HALOPERIDOL 5 MG PO TABS: 5.0000 mg | ORAL_TABLET | Freq: Three times a day (TID) | ORAL | Status: DC | PRN

## 2023-12-27 MED ORDER — NICOTINE 21 MG/24HR TD PT24
21.0000 mg | MEDICATED_PATCH | Freq: Every day | TRANSDERMAL | Status: DC
  Administered 2023-12-28 – 2024-01-04 (×8): 21 mg via TRANSDERMAL
  Filled 2023-12-27 (×8): qty 1

## 2023-12-27 MED ORDER — HALOPERIDOL LACTATE 5 MG/ML IJ SOLN: 10.0000 mg | Freq: Three times a day (TID) | INTRAMUSCULAR | Status: DC | PRN

## 2023-12-27 NOTE — Tx Team (Signed)
 Initial Treatment Plan 12/27/2023 5:11 PM DEJUAN ELMAN FMW:[id number]    PATIENT STRESSORS: Medication change or noncompliance     PATIENT STRENGTHS: Communication skills  Supportive family/friends    PATIENT IDENTIFIED PROBLEMS: Medication regulation  Coping skills  Getting back on track with life.                 DISCHARGE CRITERIA:  Ability to meet basic life and health needs Motivation to continue treatment in a less acute level of care Verbal commitment to aftercare and medication compliance  PRELIMINARY DISCHARGE PLAN: Outpatient therapy Return to previous living arrangement  PATIENT/FAMILY INVOLVEMENT: This treatment plan has been presented to and reviewed with the patient, ARTHURO CANELO. The patient has been given the opportunity to ask questions and make suggestions.  Jinnie LULLA Gails, RN 12/27/2023, 5:11 PM

## 2023-12-27 NOTE — ED Notes (Signed)
 Pt has been offered to shower multiple times today. Pt states he will in a little while each time.

## 2023-12-27 NOTE — ED Notes (Signed)
Patient given turkey sandwich and soda

## 2023-12-27 NOTE — ED Notes (Addendum)
 Report given to [place]. Pt will be transported to [facility] by Sheriff at 230p.

## 2023-12-27 NOTE — ED Notes (Signed)
 Mom called to find out how pt is doing. Pt granted permission to say that he is taking his meds and he is doing fine. Tried to call mom back at number in chart but got a not in service message on the phone.

## 2023-12-27 NOTE — Consult Note (Signed)
 [facility] Psychiatric Consult Initial  Patient Name: .Ronald [name]  MRN: [id number]  DOB: 1980/10/12  Consult Order details:  Orders (From admission, onward)     Start     Ordered   12/26/23 2327  CONSULT TO CALL ACT TEAM       Ordering Provider: Dean Clarity, MD  Provider:  (Not yet assigned)  Question:  Reason for Consult?  Answer:  Psych consult   12/26/23 2326             Mode of Visit: In person    Psychiatry Consult Evaluation  Service Date: December 27, 2023 LOS:  LOS: 0 days  Chief Complaint Pt comes in today with IVC papers taken out by his mother. IVC papers state that pt has not been taking care of himself.   Primary Psychiatric Diagnoses  Schizophrenia 2.   Bipolar disorder 3.   PTSD  Assessment  Ronald [name] is a 43 y.o. male admitted: Presented to the [facility] 12/26/2023  9:51 PM for not taking care of himself, responding to internal stimuli, and being paranoid. He carries the psychiatric diagnoses of bipolar disorder, schizophrenia, anxiety, and PTSD and has a past medical history of none.   43 year old male with schizophrenia, bipolar disorder, and PTSD presents under IVC with a severe exacerbation of psychosis and mania, characterized by: Bizarre delusions (God and aliens communicating, creating new DNA). Severe decline in self-care and hygiene. Social isolation and paranoia. Inappropriate sexual behavior toward mother. Increasing frequency of mood cycles and psychiatric decompensation. Although the patient denies psychosis, SI, and HI, collateral information and presentation strongly indicate active psychosis and high risk behaviors, compounded by noncompliance, poor insight, and lack of outpatient follow-up. The positive UDS for marijuana suggests possible substance use contributing to symptom exacerbation.  He cannot safely remain in the community due to impaired judgment, escalating psychiatric symptoms, and risk of harm to himself or others.  Please see plan below for detailed recommendations.   Diagnoses:  Active Hospital problems: Principal Problem:   Bipolar 1 disorder (HCC) Active Problems:   GAD (generalized anxiety disorder)   PTSD (post-traumatic stress disorder)    Plan   ## Psychiatric Medication Recommendations:  Continue home medications  ## Medical Decision Making Capacity: Not specifically addressed in this encounter  ## Further Work-up:  -- No further workup needed at this time EKG, While pt on Qtc prolonging medications, please monitor & replete K+ to 4 and Mg2+ to 2, or UDS -- most recent EKG on 10/15/2023 had QtC of 444 -- Pertinent labwork reviewed earlier this admission includes: CBC, CMP, EKG, UDS   ## Disposition:-- We recommend inpatient psychiatric hospitalization after medical hospitalization. Patient has been involuntarily committed on 12/26/2023.   ## Behavioral / Environmental: -To minimize splitting of staff, assign one staff person to communicate all information from the team when feasible. or Utilize compassion and acknowledge the patient's experiences while setting clear and realistic expectations for care.    ## Safety and Observation Level:  - Based on my clinical evaluation, I estimate the patient to be at low risk of self harm in the current setting. - At this time, we recommend  routine. This decision is based on my review of the chart including patient's history and current presentation, interview of the patient, mental status examination, and consideration of suicide risk including evaluating suicidal ideation, plan, intent, suicidal or self-harm behaviors, risk factors, and protective factors. This judgment is based on our ability to directly address suicide risk,  implement suicide prevention strategies, and develop a safety plan while the patient is in the clinical setting. Please contact our team if there is a concern that risk level has changed.  CSSR Risk Category:C-SSRS RISK  CATEGORY: No Risk  Suicide Risk Assessment: Patient has following modifiable risk factors for suicide: recklessness, active mental illness (to encompass adhd, tbi, mania, psychosis, trauma reaction), and recent psychiatric hospitalization, which we are addressing by recommending inpatient psychiatric admission. Patient has following non-modifiable or demographic risk factors for suicide: male gender and psychiatric hospitalization Patient has the following protective factors against suicide: Supportive family  Thank you for this consult request. Recommendations have been communicated to the primary team.  We will continue to follow patient at this time.   CATHALEEN ADAM, PMHNP       History of Present Illness  Relevant Aspects of Hospital ED Course:  Admitted on 12/26/2023 for Bizarre delusions (God and aliens communicating, creating new DNA). Severe decline in self-care and hygiene. Social isolation and paranoia. Inappropriate sexual behavior toward mother. Increasing frequency of mood cycles and psychiatric decompensation.   Patient Report:  Ronald [name], 43 y.o., male patient seen face to face by this provider, consulted with Dr. Larina; and chart reviewed on 12/27/23.  On evaluation Ronald [name] denies SI, HI, or AVH during evaluation. States he only came to the hospital because he ran out of medications. Reports compliance with Depakote , Neurontin , Haldol , and trazodone . Denies taking Cogentin , stating he is allergic -- mother disputes this, stating it is not true. Becomes defensive and angry when informed of positive UDS for marijuana, insisting results are wrong and accusing hospital of dishonesty. Reports belief that current hospitalization is unnecessary.  During evaluation Ronald [name] is Appearance: Disheveled, malodorous, wearing dirty clothes. Behavior: Guarded, irritable, minimally cooperative at times. Speech: Normal rate and volume. Mood: "Fine" (per patient); mood  appears irritable. Affect: Blunted, occasionally labile. Thought Process: Linear superficially, but preoccupied and evasive when pressed about delusional content. Thought Content: Denies SI/HI, but collateral and IVC paperwork indicate active religious and persecutory delusions. Perceptions: Denies AVH during evaluation, though collateral reports indicate he talks to himself and responds to internal stimuli. Insight: Poor - does not recognize symptoms or need for treatment. Judgment: Poor - impaired decision-making, inappropriate behaviors at home. Orientation: Alert and oriented 4.  Past Psychiatric History: Schizophrenia, PTSD, Bipolar Disorder Multiple prior inpatient psychiatric admissions at various facilities. History of IVC episodes due to psychosis, mania, and safety concerns.  Past Medical History: Not specified.  Substance Use: Patient denies all illicit substance use. UDS positive for marijuana (THC). Family and IVC paperwork report use of hemp products, THC, and nicotine  pouch abuse.  Social History: Lives with mother. 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 Unemployed, socially isolated, spends majority of time in his bedroom. Dependent on mother for care and supervision.   Psych ROS:  Depression: Denies Anxiety:  Denies Mania (lifetime and current): Denies Psychosis: (lifetime and current): Positive  Collateral information:  Collateral Information (Mother) Reports progressive worsening of patient's psychiatric symptoms. Increasing manic and psychotic episodes now occur every 6-8 weeks. Confirms concerns of poor hygiene, social withdrawal, bizarre delusions, and talking to himself. Shares disturbing incident: found patient masturbating in her room while she was asleep after taking a muscle relaxant, which has never occurred before and represents a  significant behavioral change. States patient is treatment savvy  and knows how to "play the system" to get discharged prematurely by minimizing or denying symptoms during evaluations. Reports patient secretly took his deceased brother's urn and dumped his ashes on the street, then denied the act when confronted.   Review of Systems  Psychiatric/Behavioral:  Positive for hallucinations.      Psychiatric and Social History  Psychiatric History:  Information collected from patient chart   Prev Dx/Sx: Bipolar disorder, PTSD, anxiety Current Psych Provider: None Home Meds (current): See above Previous Med Trials: Unknown Therapy: None   Prior Psych Hospitalization: Yes Prior Self Harm: Yes Prior Violence: Yes   Family Psych History: Denies Family Hx suicide: Denies   Social History:  Developmental Hx: Deferred Educational Hx: Unknown Occupational Hx: Unemployed Legal Hx: Denies Living Situation: Lives at home with mother Spiritual Hx: Yes Access to weapons/lethal means: Denies    Substance History Alcohol: Past alcohol abuse  Tobacco: No Illicit drugs: THC Prescription drug abuse: Denies Rehab hx: Denies  Exam Findings  Physical Exam:  Vital Signs:  Temp:  [97.8 F (36.6 C)-98.1 F (36.7 C)] 97.8 F (36.6 C) (09/13 0627) Pulse Rate:  [62-76] 62 (09/13 0627) Resp:  [17-19] 19 (09/13 0627) BP: (102-135)/(53-89) 114/71 (09/13 0627) SpO2:  [97 %-99 %] 97 % (09/13 0627) Blood pressure 114/71, pulse 62, temperature 97.8 F (36.6 C), resp. rate 19, SpO2 97%. There is no height or weight on file to calculate BMI.  Physical Exam Vitals and nursing note reviewed. Exam conducted with a chaperone present.  Neurological:     Mental Status: He is alert.  Psychiatric:        Attention and Perception: Attention normal.        Mood and Affect: Affect is flat.        Speech: Speech normal.        Behavior: Behavior is withdrawn.        Thought Content: Thought content is  delusional.        Cognition and Memory: Cognition is impaired.        Judgment: Judgment is inappropriate.     Mental Status Exam: General Appearance: Disheveled  Orientation:  Other:  name and environment   Memory:  Immediate;   Poor Remote;   Poor  Concentration:  Concentration: Poor and Attention Span: Poor  Recall:  Poor  Attention  Poor  Eye Contact:  Minimal  Speech:  Pressured and rapid  Language:  Poor  Volume:  Normal  Mood: anxious, labile   Affect:  Inappropriate and Labile  Thought Process:  Disorganized  Thought Content:  Illogical, Delusions, Obsessions, and Paranoid Ideation  Suicidal Thoughts:  No  Homicidal Thoughts:  No  Judgement:  Impaired  Insight:  Lacking  Psychomotor Activity:  Normal  Akathisia:  No  Fund of Knowledge:  Poor    Assets:  Communication Skills Desire for Improvement Housing Social Support  Cognition:  Impaired,  Severe  ADL's:  Impaired  AIMS (if indicated):        Other History   These have been pulled in through the EMR, reviewed, and updated if appropriate.  Family History:  The patient's family history is not on file.  Medical History: Past Medical History:  Diagnosis Date   Anxiety    Arthritis    Disorder of pineal gland    Post traumatic stress disorder    Schizoaffective disorder, bipolar type (HCC) 08/29/2023   Severe manic bipolar 1 disorder with psychotic behavior (HCC) 08/29/2023    Surgical  History: Past Surgical History:  Procedure Laterality Date   ANKLE SURGERY       Medications:   Current Facility-Administered Medications:    benztropine  (COGENTIN ) tablet 1 mg, 1 mg, Oral, BID PRN, Haviland, Julie, MD   divalproex  (DEPAKOTE  ER) 24 hr tablet 1,000 mg, 1,000 mg, Oral, BID, Haviland, Julie, MD, 1,000 mg at 12/27/23 9090   gabapentin  (NEURONTIN ) capsule 200 mg, 200 mg, Oral, TID, Haviland, Julie, MD, 200 mg at 12/27/23 9090   haloperidol  (HALDOL ) tablet 10 mg, 10 mg, Oral, QHS, Haviland, Julie,  MD, 10 mg at 12/27/23 0350   nicotine  (NICODERM CQ  - dosed in mg/24 hours) patch 21 mg, 21 mg, Transdermal, Daily, Haviland, Julie, MD, 21 mg at 12/27/23 0908   traZODone  (DESYREL ) tablet 150 mg, 150 mg, Oral, QHS, Haviland, Julie, MD, 150 mg at 12/27/23 0350  Current Outpatient Medications:    divalproex  (DEPAKOTE  ER) 500 MG 24 hr tablet, Take 2 tablets (1,000 mg total) by mouth 2 (two) times daily., Disp: 120 tablet, Rfl: 0   gabapentin  (NEURONTIN ) 100 MG capsule, Take 2 capsules (200 mg total) by mouth 3 (three) times daily., Disp: 180 capsule, Rfl: 0   haloperidol  (HALDOL ) 10 MG tablet, Take 1 tablet (10 mg total) by mouth at bedtime., Disp: 30 tablet, Rfl: 0   magnesium  oxide (MAG-OX) 400 (240 Mg) MG tablet, Take 400 mg by mouth at bedtime., Disp: , Rfl:    Multiple Vitamins-Minerals (MULTIVITAMIN WITH MINERALS) tablet, Take 1 tablet by mouth daily., Disp: , Rfl:    nicotine  (NICODERM CQ  - DOSED IN MG/24 HOURS) 14 mg/24hr patch, Place 14 mg onto the skin daily., Disp: , Rfl:    traZODone  (DESYREL ) 50 MG tablet, Take 50 mg by mouth at bedtime., Disp: , Rfl:    benztropine  (COGENTIN ) 1 MG tablet, Take 1 tablet (1 mg total) by mouth 2 (two) times daily as needed (with haldol  during agitation protocol not to exceed 2 mg daily). (Patient not taking: Reported on 12/26/2023), Disp: 60 tablet, Rfl: 0  Allergies: Allergies  Allergen Reactions   Hydroxyzine  Itching, Anxiety and Other (See Comments)    Hyper, too   Alupent [Metaproterenol] Other (See Comments)    Hyperactivity   Benadryl [Diphenhydramine] Other (See Comments)    Pt states that this makes him feel insane   Klonopin  [Clonazepam ] Other (See Comments)    Patient feels Ativan  is a better fit for him than Klonopin - effect is not as strong.   Other Other (See Comments)    Pt reports that all antihistamines make him feel insane   Quetiapine      Hallucinations    Zyprexa  [Olanzapine ] Other (See Comments)    This made me feel  weird.    Jayleen Afonso MOTLEY-MANGRUM, PMHNP

## 2023-12-27 NOTE — Group Note (Signed)
 Date:  12/27/2023 Time:  4:40 PM  Group Topic/Focus:  Self Care:   The focus of this group is to help patients understand the importance of self-care and sleep hygiene in order to improve or restore emotional, physical, spiritual, interpersonal, and financial health.    Additional Comments:  Patient did not attend.   Ronald [name] 12/27/2023, 4:40 PM

## 2023-12-27 NOTE — Progress Notes (Signed)
 Patient admitted to unit under IVC for displaying paranoid, bizarre, manic behavior. Per report, patient's Mother stated that, patient has been neglecting his personal hygiene and has been refusing to attend his Doctor's appointment. Upon admission assessment, pt stated,  I'm here because my doctor prescribed my medication late and I didn't have time to pick it up from [employer]. Pt also stated,  I don't like taking such high dose of Haldol  it makes me anxious. Pt denied SI/HI and AVH. Skin and safety check completed. Skin assessment documented, no contraband found. Patient escorted on unit and orientation provided with verbalization of understanding. Safety maintained.  12/27/23 1545  Psych Admission Type (Psych Patients Only)  Admission Status Involuntary  Psychosocial Assessment  Patient Complaints Anxiety;Worrying  Eye Contact Brief  Facial Expression Anxious  Affect Anxious;Preoccupied  Speech Logical/coherent  Interaction Assertive  Motor Activity Fidgety  Appearance/Hygiene Body odor;In scrubs  Behavior Characteristics Cooperative;Anxious;Fidgety  Mood Anxious;Preoccupied  Thought Process  Coherency WDL  Content Blaming others  Delusions None reported or observed  Perception WDL  Hallucination None reported or observed  Judgment Poor  Confusion None  Danger to Self  Current suicidal ideation? Denies  Danger to Others  Danger to Others None reported or observed

## 2023-12-27 NOTE — Plan of Care (Signed)
   Problem: Education: Goal: Knowledge of [place] [facility] Education information/materials will improve Outcome: Progressing   Problem: Safety: Goal: Periods of time without injury will increase Outcome: Progressing

## 2023-12-27 NOTE — Progress Notes (Signed)
 (  Sleep Hours) - 7.25 (Any PRNs that were needed, meds refused, or side effects to meds)- none (Any disturbances and when (visitation, over night)- none (Concerns raised by the patient)- none (SI/HI/AVH)- denies

## 2023-12-27 NOTE — BH Assessment (Signed)
 TTS attempted to assess pt. Per Ronal, [place], pt is too somnolent for an assessment. Pt was given PM medications at 2330. TTS will be notified once pt is awake and alert for an assessment.

## 2023-12-27 NOTE — Group Note (Signed)
 Date:  12/27/2023 Time:  9:20 PM  Group Topic/Focus:  Wrap-Up Group:   The focus of this group is to help patients review their daily goal of treatment and discuss progress on daily workbooks.    Participation Level:  Active  Participation Quality:  Appropriate  Affect:  Appropriate  Cognitive:  Appropriate  Insight: Appropriate  Engagement in Group:  Engaged  Modes of Intervention:  Discussion and Support  Additional Comments:  Ronald [name] appeared actively engaged in group. Though he is often appears quiet, he shared and was supportive of the other patients. He described himself as robust. He is proud that he has a healthy and supportive relationship with his mother. He looks forward to receiving a plan to manage his medication. Once regulated, he strives to continue monitoring his symptoms and routinely taking his meds. [facility] staff can support him by helping with medication management and being supportive. Pt will continue to work on tx goals.   [name] 12/27/2023, 9:20 PM

## 2023-12-27 NOTE — Progress Notes (Signed)
 [facility] LCSW Progress Note   12/27/2023    1:37 PM  BLISS TSANG   [id number]   Type of Contact and Topic:  Psychiatric Bed Placement   Pt accepted to [facility] 406-1     Patient meets inpatient criteria per Cathaleen Adam, [facility]   The attending provider will be Dr. Raliegh   Call report to [phone]    Sydelle Kerns, RN @ [facility] notified.     Pt scheduled  to arrive at [facility] TODAY.    Bunnie Gallop, MSW, LCSW-A  1:40 PM 12/27/2023

## 2023-12-28 DIAGNOSIS — F250 Schizoaffective disorder, bipolar type: Principal | ICD-10-CM

## 2023-12-28 MED ORDER — MELATONIN 5 MG PO TABS
5.0000 mg | ORAL_TABLET | Freq: Every day | ORAL | Status: DC
  Administered 2023-12-28 – 2024-01-03 (×7): 5 mg via ORAL
  Filled 2023-12-28 (×4): qty 1
  Filled 2023-12-28: qty 7
  Filled 2023-12-28 (×3): qty 1

## 2023-12-28 MED ORDER — DIVALPROEX SODIUM ER 500 MG PO TB24
1000.0000 mg | ORAL_TABLET | Freq: Two times a day (BID) | ORAL | Status: DC
  Administered 2023-12-28 – 2024-01-01 (×8): 1000 mg via ORAL
  Filled 2023-12-28 (×8): qty 2

## 2023-12-28 MED ORDER — TRAZODONE HCL 50 MG PO TABS
50.0000 mg | ORAL_TABLET | Freq: Every evening | ORAL | Status: DC | PRN
  Administered 2023-12-29 – 2024-01-03 (×4): 50 mg via ORAL
  Filled 2023-12-28: qty 1
  Filled 2023-12-28: qty 7
  Filled 2023-12-28 (×4): qty 1

## 2023-12-28 MED ORDER — GABAPENTIN 300 MG PO CAPS
300.0000 mg | ORAL_CAPSULE | Freq: Two times a day (BID) | ORAL | Status: DC
  Administered 2023-12-28 – 2024-01-04 (×14): 300 mg via ORAL
  Filled 2023-12-28 (×4): qty 1
  Filled 2023-12-28: qty 14
  Filled 2023-12-28 (×11): qty 1

## 2023-12-28 NOTE — Plan of Care (Signed)
   Problem: Education: Goal: Emotional status will improve Outcome: Progressing Goal: Mental status will improve Outcome: Progressing Goal: Verbalization of understanding the information provided will improve Outcome: Progressing

## 2023-12-28 NOTE — [facility] Suicide Risk Assessment (Signed)
 [facility] Admission Suicide Risk Assessment   Nursing information obtained from:  Patient Demographic factors:  Male, Caucasian Current Mental Status:  NA Loss Factors:  NA Historical Factors:  NA Risk Reduction Factors:  Sense of responsibility to family, Positive social support  Total Time spent with patient:: 1 Hour Principal Problem: Bipolar 1 disorder (HCC) Diagnosis:  Principal Problem:   Bipolar 1 disorder (HCC)   Subjective Data:  Ronald [name] is a 43 y.o., male with history of schizoaffective disorder, bipolar type, GAD, PTSD, cannabis use disorder admitted to [facility] under IVC due to paranoia and not taking care of personal hygiene.    Patient reports having no acute concerns with the exception that he had been on too much haloperidol  describing symptoms of his jaw locking. He reports he had been on a lower dose of it at 10 mg. Later during the assessment, he reports there was some problem with providers not prescribing his medications appropriately which is the reason why he has ended up in the hospital.  He also cites that his mother whom he lives with and neighbor are conspiring to get him hospitalized.  He adamantly reports that he has been attending to his own hygiene and eating appropriately which he felt were inappropriate reasons to IVC someone.  He denies current SI/HI/AVH.  He reports mother is a large stressor for him because she bothers him a lot to get his medications addressed.  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 He feels irritated that he's in the hospital but has no acute concerns at this time. He reports compliance with medications with the exception of haloperidol  which he feels was at a supratherapeutic dose as he reportedly was on 30 mg. He is often blaming of others for why he is currently hospitalized. He does not appear as irritable as compared to prior hospitalizations.    He declined I call mom  for collateral.    He denies recent symptoms of mania nor psychosis.   Continued Clinical Symptoms:  Alcohol Use Disorder Identification Test Final Score (AUDIT): 0 The Alcohol Use Disorders Identification Test, Guidelines for Use in Primary Care, Second Edition.  World Science writer [facility]). Score between 0-7:  no or low risk or alcohol related problems. Score between 8-15:  moderate risk of alcohol related problems. Score between 16-19:  high risk of alcohol related problems. Score 20 or above:  warrants further diagnostic evaluation for alcohol dependence and treatment.   CLINICAL FACTORS:   Severe Anxiety and/or Agitation Alcohol/Substance Abuse/Dependencies Previous Psychiatric Diagnoses and Treatments   Musculoskeletal: Strength & Muscle Tone: within normal limits Gait & Station: normal Patient leans: N/A  Psychiatric Specialty Exam:  Presentation  General Appearance:  Appropriate for Environment   Eye Contact: Good   Speech: Normal Rate   Speech Volume: Normal   Handedness: Right   Mood and Affect  Mood: Euthymic   Affect: Flat    Thought Process  Thought Processes: Linear   Descriptions of Associations:Intact   Orientation:Full (Time, Place and Person)   Thought Content:Logical   History of Schizophrenia/Schizoaffective disorder:No   Duration of Psychotic Symptoms:N/A   Hallucinations:No data recorded  Ideas of Reference:None   Suicidal Thoughts:No data recorded  Homicidal Thoughts:No data recorded   Sensorium  Memory: Immediate Good; Recent Good   Judgment: Fair   Insight: Fair    Art therapist  Concentration: Fair   Attention Span: Good  Recall: Metta Abe of Knowledge: Good   Language: Good    Psychomotor Activity  Psychomotor Activity:No data recorded   Assets  Assets: Communication Skills; Financial Resources/Insurance; Physical Health; Housing; Social  Support    Sleep  Sleep:No data recorded      COGNITIVE FEATURES THAT CONTRIBUTE TO RISK:  None    SUICIDE RISK:   Mild:  Suicidal ideation of limited frequency, intensity, duration, and specificity.  There are no identifiable plans, no associated intent, mild dysphoria and related symptoms, good self-control (both objective and subjective assessment), few other risk factors, and identifiable protective factors, including available and accessible social support.  PLAN OF CARE: see H&P  I certify that inpatient services furnished can reasonably be expected to improve the patient's condition.   Prentice Espy, MD 12/28/2023, 2:00 PM
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
## 2023-12-28 NOTE — Group Note (Signed)
 Date:  12/28/2023 Time:  11:48 AM  Group Topic/Focus:  Goals Group:   The focus of this group is to help patients establish daily goals to achieve during treatment and discuss how the patient can incorporate goal setting into their daily lives to aide in recovery.    Participation Level:  Did Not Attend  Participation Quality:  Pt Did Not Attend  Affect:  Pt Did Not Attend  Cognitive:  Pt Did Not Attend  Insight: Pt Did Not Attend  Engagement in Group:  Pt Did Not Attend  Modes of Intervention:  Pt Did Not Attend  Additional Comments:  Pt Did Not Attend  Bernell Haynie E Jaiyla Granados 12/28/2023, 11:48 AM

## 2023-12-28 NOTE — Plan of Care (Signed)
  Problem: Education: Goal: Knowledge of Ronald [name] General Education information/materials will improve Outcome: Progressing   Problem: Education: Goal: Emotional status will improve Outcome: Progressing   Problem: Education: Goal: Mental status will improve Outcome: Progressing   
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
## 2023-12-28 NOTE — Group Note (Signed)
 Date:  12/28/2023 Time:  5:49 PM  Group Topic/Focus:  Coping With Mental Health Crisis:   The purpose of this group is to help patients identify strategies for coping with mental health crisis.  Group discusses possible causes of crisis and ways to manage them effectively. Identifying Needs:   The focus of this group is to help patients identify their personal needs that have been historically problematic and identify healthy behaviors to address their needs. Wellness Toolbox:   The focus of this group is to discuss various aspects of wellness, balancing those aspects and exploring ways to increase the ability to experience wellness.  Patients will create a wellness toolbox for use upon discharge.    Participation Level:  Active  Participation Quality:  Appropriate  Affect:  Appropriate  Cognitive:  Appropriate  Insight: Appropriate  Engagement in Group:  Engaged  Modes of Intervention:  Discussion  Additional Comments:    Eleanor JAYSON Metro 12/28/2023, 5:49 PM

## 2023-12-28 NOTE — Plan of Care (Signed)
   Problem: Education: Goal: Emotional status will improve Outcome: Progressing Goal: Mental status will improve Outcome: Progressing   Problem: Activity: Goal: Interest or engagement in activities will improve Outcome: Progressing Goal: Sleeping patterns will improve Outcome: Progressing   Problem: Safety: Goal: Periods of time without injury will increase Outcome: Progressing

## 2023-12-28 NOTE — Progress Notes (Signed)
(  Sleep Hours) -7.25   (Any PRNs that were needed, meds refused, or side effects to meds)- Trazodone  50 mg  (Any disturbances and when (visitation, over night)- N/A  (Concerns raised by the patient)- none  (SI/HI/AVH)-denies

## 2023-12-29 ENCOUNTER — Encounter (HOSPITAL_COMMUNITY): Payer: Self-pay

## 2023-12-29 LAB — LIPID PANEL
Cholesterol: 141 mg/dL (ref 0–200)
HDL: 60 mg/dL (ref >40)
LDL Cholesterol: 71 mg/dL (ref 0–99)
Total CHOL/HDL Ratio: 2.4 ratio
Triglycerides: 49 mg/dL (ref <150)
VLDL: 10 mg/dL (ref 0–40)

## 2023-12-29 LAB — MAGNESIUM: Magnesium: 2.2 mg/dL (ref 1.7–2.4)

## 2023-12-29 LAB — HEMOGLOBIN A1C
Hgb A1c MFr Bld: 4.8 % (ref 4.8–5.6)
Mean Plasma Glucose: 91.06 mg/dL

## 2023-12-29 MED ORDER — MAGNESIUM OXIDE -MG SUPPLEMENT 400 (240 MG) MG PO TABS
400.0000 mg | ORAL_TABLET | Freq: Every day | ORAL | Status: DC
  Administered 2023-12-29 – 2024-01-03 (×6): 400 mg via ORAL
  Filled 2023-12-29 (×4): qty 1
  Filled 2023-12-29: qty 7
  Filled 2023-12-29 (×2): qty 1

## 2023-12-29 MED ORDER — NICOTINE POLACRILEX 2 MG MT GUM
2.0000 mg | CHEWING_GUM | OROMUCOSAL | Status: DC
  Administered 2023-12-29 – 2024-01-01 (×12): 2 mg via ORAL
  Filled 2023-12-29 (×6): qty 1

## 2023-12-29 NOTE — Group Note (Signed)
 Date:  12/29/2023 Time:  9:43 PM  Group Topic/Focus:  Wrap-Up Group:   The focus of this group is to help patients review their daily goal of treatment and discuss progress on daily workbooks.    Participation Level:  Active  Participation Quality:  Appropriate  Affect:  Appropriate  Cognitive:  Alert  Insight: Appropriate  Engagement in Group:  Engaged  Modes of Intervention:  Activity  Additional Comments:    Leigh VEAR Pais 12/29/2023, 9:43 PM

## 2023-12-29 NOTE — BHH Group Notes (Signed)
 The focus of this group is to help patients establish daily goals to achieve during treatment and discuss how the patient can incorporate goal setting into their daily lives to aide in recovery.       Scale 1-10  7   Goal: Medication regulation

## 2023-12-29 NOTE — BH IP Treatment Plan (Signed)
 Interdisciplinary Treatment and Diagnostic Plan Update  12/29/2023 Time of Session: 10:30AM Ronald [name] MRN: [id number]  Principal Diagnosis: Schizoaffective disorder, bipolar type [facility])  Secondary Diagnoses: Principal Problem:   Schizoaffective disorder, bipolar type (HCC) Active Problems:   PTSD (post-traumatic stress disorder)   Current Medications:  Current Facility-Administered Medications  Medication Dose Route Frequency Provider Last Rate Last Admin   acetaminophen  (TYLENOL ) tablet 650 mg  650 mg Oral Q6H PRN Motley-Mangrum, Jadeka A, PMHNP   650 mg at 12/29/23 0745   alum & mag hydroxide-simeth (MAALOX/MYLANTA) 200-200-20 MG/5ML suspension 30 mL  30 mL Oral Q4H PRN Motley-Mangrum, Jadeka A, PMHNP       benztropine  (COGENTIN ) tablet 1 mg  1 mg Oral BID PRN Motley-Mangrum, Jadeka A, PMHNP       divalproex  (DEPAKOTE  ER) 24 hr tablet 1,000 mg  1,000 mg Oral Q12H Ji, Andrew, MD   1,000 mg at 12/29/23 9256   gabapentin  (NEURONTIN ) capsule 300 mg  300 mg Oral Q12H Lynnette Barter, MD   300 mg at 12/29/23 9256   haloperidol  (HALDOL ) tablet 10 mg  10 mg Oral QHS Motley-Mangrum, Jadeka A, PMHNP   10 mg at 12/28/23 2101   haloperidol  (HALDOL ) tablet 5 mg  5 mg Oral TID PRN Motley-Mangrum, Jadeka A, PMHNP       haloperidol  lactate (HALDOL ) injection 10 mg  10 mg Intramuscular TID PRN Motley-Mangrum, Jadeka A, PMHNP       haloperidol  lactate (HALDOL ) injection 5 mg  5 mg Intramuscular TID PRN Motley-Mangrum, Jadeka A, PMHNP       magnesium  hydroxide (MILK OF MAGNESIA) suspension 30 mL  30 mL Oral Daily PRN Motley-Mangrum, Jadeka A, PMHNP       melatonin tablet 5 mg  5 mg Oral QHS Ji, Andrew, MD   5 mg at 12/28/23 2101   nicotine  (NICODERM CQ  - dosed in mg/24 hours) patch 21 mg  21 mg Transdermal Daily Motley-Mangrum, Jadeka A, PMHNP   21 mg at 12/29/23 0744   traZODone  (DESYREL ) tablet 50 mg  50 mg Oral QHS PRN,MR X 1 Ji, Andrew, MD       PTA Medications: Medications Prior to Admission   Medication Sig Dispense Refill Last Dose/Taking   benztropine  (COGENTIN ) 1 MG tablet Take 1 tablet (1 mg total) by mouth 2 (two) times daily as needed (with haldol  during agitation protocol not to exceed 2 mg daily). (Patient not taking: Reported on 12/26/2023) 60 tablet 0    divalproex  (DEPAKOTE  ER) 500 MG 24 hr tablet Take 2 tablets (1,000 mg total) by mouth 2 (two) times daily. 120 tablet 0    gabapentin  (NEURONTIN ) 100 MG capsule Take 2 capsules (200 mg total) by mouth 3 (three) times daily. 180 capsule 0    haloperidol  (HALDOL ) 10 MG tablet Take 1 tablet (10 mg total) by mouth at bedtime. 30 tablet 0    magnesium  oxide (MAG-OX) 400 (240 Mg) MG tablet Take 400 mg by mouth at bedtime.      Multiple Vitamins-Minerals (MULTIVITAMIN WITH MINERALS) tablet Take 1 tablet by mouth daily.      nicotine  (NICODERM CQ  - DOSED IN MG/24 HOURS) 14 mg/24hr patch Place 14 mg onto the skin daily.      traZODone  (DESYREL ) 50 MG tablet Take 50 mg by mouth at bedtime.       Patient Stressors: Medication change or noncompliance    Patient Strengths: Manufacturing systems engineer  Supportive family/friends   Treatment Modalities: Medication Management, Group therapy, Case management,  1 to 1 session with clinician, Psychoeducation, Recreational therapy.   Physician Treatment Plan for Primary Diagnosis: Schizoaffective disorder, bipolar type (HCC) Long Term Goal(s):     Short Term Goals:    Medication Management: Evaluate patient's response, side effects, and tolerance of medication regimen.  Therapeutic Interventions: 1 to 1 sessions, Unit Group sessions and Medication administration.  Evaluation of Outcomes: Not Progressing  Physician Treatment Plan for Secondary Diagnosis: Principal Problem:   Schizoaffective disorder, bipolar type (HCC) Active Problems:   PTSD (post-traumatic stress disorder)  Long Term Goal(s):     Short Term Goals:       Medication Management: Evaluate patient's response, side  effects, and tolerance of medication regimen.  Therapeutic Interventions: 1 to 1 sessions, Unit Group sessions and Medication administration.  Evaluation of Outcomes: Not Progressing   RN Treatment Plan for Primary Diagnosis: Schizoaffective disorder, bipolar type (HCC) Long Term Goal(s): Knowledge of disease and therapeutic regimen to maintain health will improve  Short Term Goals: Ability to remain free from injury will improve, Ability to verbalize frustration and anger appropriately will improve, Ability to demonstrate self-control, Ability to participate in decision making will improve, Ability to verbalize feelings will improve, Ability to disclose and discuss suicidal ideas, Ability to identify and develop effective coping behaviors will improve, and Compliance with prescribed medications will improve  Medication Management: RN will administer medications as ordered by provider, will assess and evaluate patient's response and provide education to patient for prescribed medication. RN will report any adverse and/or side effects to prescribing provider.  Therapeutic Interventions: 1 on 1 counseling sessions, Psychoeducation, Medication administration, Evaluate responses to treatment, Monitor vital signs and CBGs as ordered, Perform/monitor CIWA, COWS, AIMS and Fall Risk screenings as ordered, Perform wound care treatments as ordered.  Evaluation of Outcomes: Not Progressing   LCSW Treatment Plan for Primary Diagnosis: Schizoaffective disorder, bipolar type (HCC) Long Term Goal(s): Safe transition to appropriate next level of care at discharge, Engage patient in therapeutic group addressing interpersonal concerns.  Short Term Goals: Engage patient in aftercare planning with referrals and resources, Increase social support, Increase ability to appropriately verbalize feelings, Increase emotional regulation, Facilitate acceptance of mental health diagnosis and concerns, and Increase skills  for wellness and recovery  Therapeutic Interventions: Assess for all discharge needs, 1 to 1 time with Social worker, Explore available resources and support systems, Assess for adequacy in community support network, Educate family and significant other(s) on suicide prevention, Complete Psychosocial Assessment, Interpersonal group therapy.  Evaluation of Outcomes: Not Progressing   Progress in Treatment: Attending groups: Yes. Participating in groups: Yes. Taking medication as prescribed: Yes. Toleration medication: Yes. Family/Significant other contact made: No, will contact:  Jenkins Romano) [phone] Patient understands diagnosis: Yes. Discussing patient identified problems/goals with staff: Yes. Medical problems stabilized or resolved: Yes. Denies suicidal/homicidal ideation: Yes. Issues/concerns per patient self-inventory: No. Patient Goals:  my medication regulation  Discharge Plan or Barriers: Patient likely to discharge home once stable.   Reason for Continuation of Hospitalization: Medication stabilization  Estimated Length of Stay: 5-7 days  Last 3 [place] Suicide Severity Risk Score: Flowsheet Row [facility] (Current[name] from 12/26/2023 in [facility] Admission (Discharged) from 10/13/2023 in [facility] INPATIENT [facility]AL [facility]ICINE  C-SSRS RISK CATEGORY No Risk No Risk No Risk    Last PHQ 2/9 Scores:    06/13/2023    8:06 AM 11/08/2022   10:00 AM 01/14/2022  1:09 PM  Depression screen PHQ 2/9  Decreased Interest 0 0 0  Down, Depressed, Hopeless 0 0 0  PHQ - 2 Score 0 0 0  Altered sleeping 0 0 0  Tired, decreased energy 0 0 0  Change in appetite 0 0 0  Feeling bad or failure about yourself  0 0 0  Trouble concentrating 0 0 0  Moving slowly or fidgety/restless 0 0 0  Suicidal thoughts 0 0 0  PHQ-9 Score 0 0 0  Difficult doing work/chores Not difficult at  all      Scribe for Treatment Team: Burma Ketcher M Merrill Deanda, [place]RA[place] 12/29/2023 11:39 AM

## 2023-12-29 NOTE — Progress Notes (Signed)
   12/29/23 0743  Psych Admission Type (Psych Patients Only)  Admission Status Involuntary  Psychosocial Assessment  Patient Complaints None  Eye Contact Fair  Facial Expression Flat  Affect Appropriate to circumstance  Speech Unremarkable  Interaction Assertive  Motor Activity Fidgety  Appearance/Hygiene Disheveled  Behavior Characteristics Cooperative  Mood Pleasant  Thought Process  Coherency WDL  Content WDL  Delusions None reported or observed  Perception WDL  Hallucination None reported or observed  Judgment Poor  Confusion None  Danger to Self  Current suicidal ideation? Denies  Agreement Not to Harm Self Yes  Description of Agreement verbal  Danger to Others  Danger to Others None reported or observed
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
## 2023-12-29 NOTE — Group Note (Signed)
 Recreation Therapy Group Note   Group Topic:Communication  Group Date: 12/29/2023 Start Time: 0930 End Time: 1000 Facilitators: Baljit Liebert-McCall, LRT,CTRS Location: [address] Dayroom   Group Topic: Communication, Team Building, Problem Solving  Goal Area(s) Addresses:  Patient will effectively work with peer towards shared goal.  Patient will identify skills used to make activity successful.  Patient will identify how skills used during activity can be applied to reach post d/c goals.   Behavioral Response: Engaged  Intervention: STEM Activity- Glass blower/designer  Activity: Tallest [employer]. In teams of 5-6, patients were given 11 craft pipe cleaners. Using the materials provided, patients were instructed to compete again the opposing team(s) to build the tallest free-standing structure from floor level. The activity was timed; difficulty increased by Clinical research associate as Production designer, theatre/television/film continued.  Systematically resources were removed with additional directions for example, placing one arm behind their back, working in silence, and shape stipulations. LRT facilitated post-activity discussion reviewing team processes and necessary communication skills involved in completion. Patients were encouraged to reflect how the skills utilized, or not utilized, in this activity can be incorporated to positively impact support systems post discharge.  Education: Pharmacist, community, Scientist, physiological, Discharge Planning   Education Outcome: Acknowledges education/In group clarification offered/Needs additional education.    Affect/Mood: Appropriate   Participation Level: Engaged   Participation Quality: Independent   Behavior: Appropriate   Speech/Thought Process: Focused   Insight: Good   Judgement: Good   Modes of Intervention: STEM Activity   Patient Response to Interventions:  Engaged   Education Outcome:  In group clarification offered    Clinical  Observations/Individualized Feedback: Pt was engaged and worked well with peers in completing activity. Pt also expressed one of the skills used for the activity was teamwork.      Plan: Continue to engage patient in RT group sessions 2-3x/week.   Chanah Tidmore-McCall, LRT,CTRS 12/29/2023 12:42 PM

## 2023-12-29 NOTE — Group Note (Signed)
 Occupational Therapy Group Note  Group Topic: Sleep Hygiene  Group Date: 12/29/2023 Start Time: 1500 End Time: 1535 Facilitators: Dot [place] MATSU, OT   Group Description: Group encouraged increased participation and engagement through topic focused on sleep hygiene. Patients reflected on the quality of sleep they typically receive and identified areas that need improvement. Group was given background information on sleep and sleep hygiene, including common sleep disorders. Group members also received information on how to improve one's sleep and introduced a sleep diary as a tool that can be utilized to track sleep quality over a length of time. Group session ended with patients identifying one or more strategies they could utilize or implement into their sleep routine in order to improve overall sleep quality.        Therapeutic Goal(s):  Identify one or more strategies to improve overall sleep hygiene  Identify one or more areas of sleep that are negatively impacted (sleep too much, too little, etc)     Participation Level: Engaged   Participation Quality: Independent   Behavior: Appropriate   Speech/Thought Process: Relevant   Affect/Mood: Appropriate   Insight: Fair   Judgement: Fair      Modes of Intervention: Education  Patient Response to Interventions:  Attentive   Plan: Continue to engage patient in OT groups 2 - 3x/week.  12/29/2023  [place] MATSU Dot, OT [place], OT

## 2023-12-29 NOTE — Progress Notes (Signed)
 Spiritual care group on grief and loss facilitated by Chaplain Dyanne Carrel, Bcc  Group Goal: Support / Education around grief and loss  Members engage in facilitated group support and psycho-social education.  Group Description:  Following introductions and group rules, group members engaged in facilitated group dialogue and support around topic of loss, with particular support around experiences of loss in their lives. Group Identified types of loss (relationships / self / things) and identified patterns, circumstances, and changes that precipitate losses. Reflected on thoughts / feelings around loss, normalized grief responses, and recognized variety in grief experience. Group encouraged individual reflection on safe space and on the coping skills that they are already utilizing.  Group drew on Adlerian / Rogerian and narrative framework  Patient Progress: Ronald [name] attended group and actively engaged and participated in group conversation and activities.

## 2023-12-29 NOTE — Group Note (Signed)
 Date:  12/29/2023 Time:  5:03 PM  Group Topic/Focus:  Ask Me 3   Group focused on using Ask Me 3 communication tool to learn about their condition and treatment.   Participation Level:  Active  Participation Quality:  Appropriate  Affect:  Appropriate  Cognitive:  Appropriate  Insight: Appropriate  Engagement in Group:  Engaged  Modes of Intervention:  Discussion  Additional Comments:    Ronald [name] 12/29/2023, 5:03 PM

## 2023-12-29 NOTE — Progress Notes (Signed)
(  Sleep Hours) - (Any PRNs that were needed, meds refused, or side effects to meds)- Trazodone  (Any disturbances and when (visitation, over night) (Concerns raised by the patient)- none  (SI/HI/AVH)- Denies

## 2023-12-30 ENCOUNTER — Telehealth: Payer: Self-pay | Admitting: Radiology

## 2023-12-30 LAB — VALPROIC ACID LEVEL: Valproic Acid Lvl: 91 ug/mL (ref 50–100)

## 2023-12-30 NOTE — Group Note (Signed)
 Recreation Therapy Group Note   Group Topic:Animal Assisted Therapy   Group Date: 12/30/2023 Start Time: 0947 End Time: 1030 Facilitators: Chassity Ludke-McCall, LRT,CTRS Location: [address] Dayroom   Animal-Assisted Activity (AAA) Program Checklist/Progress Notes Patient Eligibility Criteria Checklist & Daily Group note for Rec Tx Intervention  AAA/T Program Assumption of Risk Form signed by Patient/ or Parent Legal Guardian Yes  Patient is free of allergies or severe asthma Yes  Patient reports no fear of animals Yes  Patient reports no history of cruelty to animals Yes  Patient understands his/her participation is voluntary Yes  Patient washes hands before animal contact Yes  Patient washes hands after animal contact Yes  Behavioral Response: Engaged   Education: Charity fundraiser, Appropriate Animal Interaction   Education Outcome: Acknowledges education.    Affect/Mood: Appropriate   Participation Level: Engaged   Participation Quality: Independent   Behavior: Appropriate   Speech/Thought Process: Focused   Insight: Good   Judgement: Good   Modes of Intervention: Teaching laboratory technician   Patient Response to Interventions:  Engaged   Education Outcome:  In group clarification offered    Clinical Observations/Individualized Feedback:  Patient attended session and interacted appropriately with therapy dog and peers. Patient asked appropriate questions about therapy dog and his training. Patient shared stories about their pets at home with group.    Plan: Continue to engage patient in RT group sessions 2-3x/week.   Debra Colon-McCall, LRT,CTRS 12/30/2023 12:25 PM

## 2023-12-30 NOTE — Telephone Encounter (Signed)
 Copied from CRM [id number]. Topic: Medical Record Request - Records Request >> Dec 29, 2023  4:58 PM Harlene ORN wrote: Reason for CRM: Mother called for patient. Patient has been admitted to the [facility]. Is requesting the document for Medical Power or Attorney to be faxed to them as soon as possible. Fax: [phone] Please refer it to Dr. Rollene or Dr. Towana.

## 2023-12-30 NOTE — Progress Notes (Signed)
   12/30/23 0800  Psych Admission Type (Psych Patients Only)  Admission Status Involuntary  Psychosocial Assessment  Patient Complaints None  Eye Contact Fair  Facial Expression Flat  Affect Appropriate to circumstance  Speech Unremarkable  Interaction Assertive  Motor Activity Fidgety  Appearance/Hygiene Disheveled  Behavior Characteristics Cooperative  Mood Pleasant  Thought Process  Coherency WDL  Content WDL  Delusions None reported or observed  Perception WDL  Hallucination None reported or observed  Judgment Poor  Confusion None  Danger to Self  Current suicidal ideation? Denies  Agreement Not to Harm Self Yes  Description of Agreement verbal  Danger to Others  Danger to Others None reported or observed

## 2023-12-30 NOTE — Plan of Care (Signed)
  Problem: Education: Goal: Knowledge of [facility] Education information/materials will improve Outcome: Completed/Met Goal: Mental status will improve Outcome: Progressing   Problem: Activity: Goal: Interest or engagement in activities will improve Outcome: Progressing

## 2023-12-30 NOTE — Telephone Encounter (Signed)
 Advised the patients mother that the Health care POA was in the patients chart. She stated that they couldn't see it at behavioral health. I faxed over a copy per her request.

## 2023-12-30 NOTE — Telephone Encounter (Signed)
 Unable to reach patients mother. Unable to leave message to return call.

## 2023-12-30 NOTE — Group Note (Signed)
 Date:  12/30/2023 Time:  5:16 PM  Group Topic/Focus:  Emotional Education:   The focus of this group is to discuss what feelings/emotions are, and how they are experienced.    Participation Level:  Active  Participation Quality:  Appropriate  Affect:  Appropriate  Cognitive:  Appropriate  Insight: Appropriate  Engagement in Group:  Engaged  Modes of Intervention:  Discussion   Annalee  Avanelle Pixley 12/30/2023, 5:16 PM

## 2023-12-30 NOTE — Group Note (Signed)
 LCSW Group Therapy Note   Group Date: 12/30/2023 Start Time: 1100 End Time: 1200   Participation:  patient attended 50% of the group session.  Patient actively participated in the discussion.  Type of Therapy:  Group Therapy  Topic:  Healing From Within: Understanding Our Past, Building Our Future"  Objective:  To help participants understand the impact of early experiences on mental and physical health, with a focus on Adverse Childhood Experiences (ACEs), and to explore ways to build resilience and healing.  Goals: Understand ACEs and Their Impact: Learn how childhood experiences shape mental and physical health. Build Resilience: Develop strategies for overcoming challenges and creating positive change. Promote Healing: Recognize the value of support and the possibility of healing through therapy and self-care.  Summary:  In today's session, we discussed how early experiences, especially ACEs, impact mental and physical health. We explored the effects of stress, abuse, and neglect on brain development and well-being. The group focused on resilience, understanding that healing and positive change are possible with support and self-awareness.  Therapeutic Modalities Used: Psychoeducation: Sharing information about ACEs and their effects. Cognitive Behavioral Therapy (CBT): Helping reframe negative thought patterns. Trauma-Informed Therapy: Creating a safe, supportive space for healing.   Ronald [name] Ronald [name], LCSWA 12/30/2023  12:30 PM

## 2023-12-30 NOTE — Group Note (Signed)
 Date:  12/30/2023 Time:  9:17 PM  Group Topic/Focus:  Wrap-Up Group:   The focus of this group is to help patients review their daily goal of treatment and discuss progress on daily workbooks.    Participation Level:  Active  Participation Quality:  Appropriate and Attentive  Affect:  Appropriate  Cognitive:  Alert and Appropriate  Insight: Appropriate and Good  Engagement in Group:  Engaged  Modes of Intervention:  Discussion and Education  Additional Comments:  Pt attended and participated in wrap up group this evening and rated their day an 8/10. Pt stated that their meds and group therapy were positive contributors to their day. Pt goal is to continue to manage their medications as needed and continue to use their learned coping skills. Pt has no concerns to relay at this time.   Ronald [name] 12/30/2023, 9:17 PM
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
## 2023-12-30 NOTE — Plan of Care (Signed)

## 2023-12-30 NOTE — Progress Notes (Signed)
   12/30/23 1245  Spiritual Encounters  Type of Visit Initial  Care provided to: Patient  Referral source Chaplain assessment  Reason for visit Routine spiritual support   I provided spiritualt care support to [facility] [place]. This is the first visit on this hospitalization but patient is known to this chaplain.  Ronald [name] welcomed me as I greeted him and remembered visiting with me before. He engaged me around topics that he finds meaningful and that are part of his meaning making.  I offered compassionate presence and active listening. I facilitated meaning making that [name] engaged in, focusing on science and faith but not entering areas that verged into delusion. I offered affirmation and relational support.  [name] L. Delores HERO.Div

## 2023-12-31 MED ORDER — LORAZEPAM 2 MG/ML IJ SOLN: 2.0000 mg | Freq: Three times a day (TID) | INTRAMUSCULAR | Status: DC | PRN

## 2023-12-31 MED ORDER — DIPHENHYDRAMINE HCL 50 MG/ML IJ SOLN: 50.0000 mg | Freq: Three times a day (TID) | INTRAMUSCULAR | Status: DC | PRN

## 2023-12-31 MED ORDER — HALOPERIDOL LACTATE 5 MG/ML IJ SOLN: 10.0000 mg | Freq: Three times a day (TID) | INTRAMUSCULAR | Status: DC | PRN

## 2023-12-31 MED ORDER — DIPHENHYDRAMINE HCL 25 MG PO CAPS: 50.0000 mg | ORAL_CAPSULE | Freq: Three times a day (TID) | ORAL | Status: DC | PRN

## 2023-12-31 MED ORDER — HALOPERIDOL 5 MG PO TABS: 5.0000 mg | ORAL_TABLET | Freq: Three times a day (TID) | ORAL | Status: DC | PRN

## 2023-12-31 MED ORDER — HALOPERIDOL LACTATE 5 MG/ML IJ SOLN
5.0000 mg | Freq: Three times a day (TID) | INTRAMUSCULAR | Status: DC | PRN
  Administered 2023-12-31: 5 mg via INTRAMUSCULAR
  Filled 2023-12-31: qty 1

## 2023-12-31 MED ORDER — HALOPERIDOL LACTATE 5 MG/ML IJ SOLN: 5.0000 mg | Freq: Three times a day (TID) | INTRAMUSCULAR | Status: DC | PRN

## 2023-12-31 MED ORDER — HALOPERIDOL 5 MG PO TABS
15.0000 mg | ORAL_TABLET | Freq: Every day | ORAL | Status: DC
  Administered 2023-12-31: 15 mg via ORAL
  Filled 2023-12-31: qty 3

## 2023-12-31 MED ORDER — LORAZEPAM 2 MG/ML IJ SOLN
2.0000 mg | Freq: Three times a day (TID) | INTRAMUSCULAR | Status: DC | PRN
  Administered 2023-12-31: 2 mg via INTRAMUSCULAR
  Filled 2023-12-31: qty 1

## 2023-12-31 MED ORDER — DIPHENHYDRAMINE HCL 50 MG/ML IJ SOLN
50.0000 mg | Freq: Three times a day (TID) | INTRAMUSCULAR | Status: DC | PRN
  Administered 2023-12-31: 50 mg via INTRAMUSCULAR
  Filled 2023-12-31: qty 1

## 2023-12-31 NOTE — Group Note (Signed)
 Date:  12/31/2023 Time:  6:28 PM  Group Topic/Focus:  Developing a Wellness Toolbox:   The focus of this group is to help patients develop a wellness toolbox with skills and strategies to promote recovery upon discharge.    Participation Level:  Active  Participation Quality:  Intrusive and Monopolizing  Affect:  Irritable and Labile  Cognitive:  Delusional  Insight: Limited  Engagement in Group:  Distracting  Modes of Intervention:  Discussion   Ronald [name]  [name] 12/31/2023, 6:28 PM

## 2023-12-31 NOTE — Progress Notes (Signed)
 Spirituality group facilitated by Elia Rockie Sofia, BCC.  Group Description: Group focused on topic of hope. Patients participated in facilitated discussion around topic, connecting with one another around experiences and definitions for hope. Group members engaged with visual explorer photos, reflecting on what hope looks like for them today. Group engaged in discussion around how their definitions of hope are present today in hospital.  Modalities: Psycho-social ed, Adlerian, Narrative, MI  Patient Progress: Ronald [name] attended group and actively engaged and participated in group conversation and activities.  He became agitated a few times during group and spoke animatedly about the medical system and society that bother him.  He was redirected, but still demonstrated frustration around what he had shared.

## 2023-12-31 NOTE — Group Note (Signed)
 Date:  12/31/2023 Time:  8:49 PM  Group Topic/Focus:  Wrap-Up Group:   The focus of this group is to help patients review their daily goal of treatment and discuss progress on daily workbooks.    Participation Level:  Did Not Attend  Participation Quality:  none  Affect:  none  Cognitive:  none  Insight: None  Engagement in Group:  None  Modes of Intervention:  none  Additional Comments:   Pt was encouraged but refused to attend wrap up group  Jabes Primo A Jordynn Perrier 12/31/2023, 8:49 PM

## 2023-12-31 NOTE — Progress Notes (Signed)
(  Sleep Hours) - 7.75 (Any PRNs that were needed, meds refused, or side effects to meds)- Trazodone  (Any disturbances and when (visitation, over night)- none (Concerns raised by the patient)- none (SI/HI/AVH)- denies

## 2023-12-31 NOTE — Progress Notes (Signed)
   12/30/23 2300  Psych Admission Type (Psych Patients Only)  Admission Status Involuntary  Psychosocial Assessment  Patient Complaints None  Eye Contact Fair  Facial Expression Animated  Affect Appropriate to circumstance  Speech Unremarkable  Interaction Assertive  Motor Activity Fidgety  Appearance/Hygiene Unremarkable  Behavior Characteristics Cooperative;Appropriate to situation  Mood Pleasant  Thought Process  Coherency WDL  Content WDL  Delusions None reported or observed  Perception WDL  Hallucination None reported or observed  Judgment Poor  Confusion None  Danger to Self  Current suicidal ideation? Denies  Agreement Not to Harm Self Yes  Description of Agreement verbal  Danger to Others  Danger to Others None reported or observed

## 2023-12-31 NOTE — Progress Notes (Signed)
   12/31/23 0740  Psychosocial Assessment  Patient Complaints None  Eye Contact Fair  Facial Expression Animated  Affect Appropriate to circumstance  Speech Unremarkable  Interaction Assertive  Motor Activity Fidgety  Appearance/Hygiene Disheveled  Behavior Characteristics Cooperative  Mood Pleasant  Thought Process  Coherency WDL  Content WDL  Delusions None reported or observed  Perception WDL  Hallucination None reported or observed  Judgment Poor  Confusion None  Danger to Self  Current suicidal ideation? Denies  Agreement Not to Harm Self Yes  Description of Agreement verb  Danger to Others  Danger to Others None reported or observed

## 2023-12-31 NOTE — Plan of Care (Signed)
  Problem: Education: Goal: Emotional status will improve Outcome: Progressing   Problem: Activity: Goal: Interest or engagement in activities will improve Outcome: Progressing   Problem: Coping: Goal: Ability to verbalize frustrations and anger appropriately will improve Outcome: Progressing   Problem: Safety: Goal: Periods of time without injury will increase Outcome: Progressing

## 2023-12-31 NOTE — Group Note (Signed)
 Recreation Therapy Group Note   Group Topic:Leisure Education  Group Date: 12/31/2023 Start Time: 0032 End Time: 1000 Facilitators: Eyanna Mcgonagle-McCall, LRT,CTRS Location: [address] Dayroom   Group Topic: Leisure Education  Goal Area(s) Addresses:  Patient will identify positive leisure activities for use post discharge. Patient will identify at least one positive benefit of participation in leisure activities.  Patient will work effectively work with peers to keep the ball in play.  Behavioral Response: Engaged    Intervention: [employer], Music   Activity: Patients were to sit in a circle. Patients would toss a beach ball to each other keeping the ball in motion. LRT would time the group to see how long they could keep the ball moving. Patients could bounce or roll the ball but it could not come to a stop. If the ball were to stop moving, LRT would start the timer over.  Education:  Leisure Scientist, physiological, Special educational needs teacher, Teamwork, Discharge Planning  Education Outcome: Acknowledges education/In group clarification offered/Needs additional education.    Affect/Mood: Appropriate   Participation Level: Engaged   Participation Quality: Independent   Behavior: Appropriate   Speech/Thought Process: Focused   Insight: Good   Judgement: Good   Modes of Intervention: Cooperative Play   Patient Response to Interventions:  Engaged   Education Outcome:  In group clarification offered    Clinical Observations/Individualized Feedback: Pt was social and bright during group. Pt was focused on keeping the ball in play during group. Pt appeared to be in a good mood and was interactive with peers.     Plan: Continue to engage patient in RT group sessions 2-3x/week.   Brody Bonneau-McCall, LRT,CTRS  12/31/2023 12:56 PM

## 2023-12-31 NOTE — Plan of Care (Signed)
   Problem: Education: Goal: Emotional status will improve Outcome: Progressing Goal: Mental status will improve Outcome: Progressing Goal: Verbalization of understanding the information provided will improve Outcome: Progressing

## 2023-12-31 NOTE — Group Note (Signed)
 Date:  12/31/2023 Time:  10:02 AM  Group Topic/Focus:  Goals Group:   The focus of this group is to help patients establish daily goals to achieve during treatment and discuss how the patient can incorporate goal setting into their daily lives to aide in recovery.    Participation Level:  Active  Participation Quality:  Appropriate  Affect:  Appropriate  Cognitive:  Appropriate  Insight: Appropriate  Engagement in Group:  Engaged  Modes of Intervention:  Discussion  Additional Comments:  attend groups, take groups  Nat Rummer 12/31/2023, 10:02 AM
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
## 2023-12-31 NOTE — Plan of Care (Signed)
  Problem: Education: Goal: Emotional status will improve Outcome: Progressing   Problem: Coping: Goal: Ability to verbalize frustrations and anger appropriately will improve Outcome: Progressing   Problem: Activity: Goal: Interest or engagement in activities will improve Outcome: Progressing Goal: Sleeping patterns will improve Outcome: Progressing

## 2023-12-31 NOTE — Group Note (Signed)
 Date:  12/31/2023 Time:  11:51 AM  Group Type: Grief Processing Group Modality: Psychoeducational Duration: 1100-1125 Participants: 11 S - Subjective:  Participants expressed a range of emotions related to grief, including sadness, confusion, anger, and moments of acceptance. Several group members shared relief in realizing that their emotional fluctuations are normal and valid. O - Objective: Introduced the Doctor, hospital to normalize the non-linear nature of grief A - Assessment:  Clients demonstrated increased insight into their grief experiences and emotional responses. Participation was active and reflective.  P - Plan: Reinforce normalization of grief variability and encourage ongoing peer sharing    Participation Level:  Active  Participation Quality:  Intrusive  Affect:  Defensive  Cognitive:  Delusional  Insight: Limited  Engagement in Group:  Monopolizing  Modes of Intervention:  Discussion  Additional Comments:  Spoke with assigned RN about observed behavior  [name] N [name] 12/31/2023, 11:51 AM

## 2023-12-31 NOTE — Progress Notes (Signed)
 Patient disruptive during group, arguing with other patients. Patient separated from other patients, kept yelling and arguing with another patient. Agitation protocol offered, patient took agitation protocol willingly, will continue monitoring patient.

## 2023-12-31 NOTE — Progress Notes (Signed)
 Patient lying down in room, responds to verbal command. Patient calm, cooperative, denies needs at this time.

## 2024-01-01 MED ORDER — VALPROIC ACID 250 MG/5ML PO SOLN
1000.0000 mg | Freq: Two times a day (BID) | ORAL | Status: DC
  Filled 2024-01-01 (×2): qty 20

## 2024-01-01 MED ORDER — HALOPERIDOL LACTATE 2 MG/ML PO CONC
15.0000 mg | Freq: Every day | ORAL | Status: DC
  Administered 2024-01-01 – 2024-01-02 (×2): 15 mg via ORAL
  Filled 2024-01-01 (×6): qty 10

## 2024-01-01 MED ORDER — VALPROIC ACID 250 MG/5ML PO SOLN
1000.0000 mg | Freq: Two times a day (BID) | ORAL | Status: DC
  Administered 2024-01-01 – 2024-01-03 (×4): 1000 mg via ORAL
  Filled 2024-01-01 (×6): qty 20

## 2024-01-01 MED ORDER — DIVALPROEX SODIUM ER 250 MG PO TB24: 250.0000 mg | ORAL_TABLET | Freq: Two times a day (BID) | ORAL | Status: DC

## 2024-01-01 MED ORDER — VALPROIC ACID 250 MG/5ML PO SOLN
1000.0000 mg | Freq: Two times a day (BID) | ORAL | Status: DC
  Filled 2024-01-01: qty 20

## 2024-01-01 MED ORDER — NICOTINE POLACRILEX 2 MG MT GUM
2.0000 mg | CHEWING_GUM | OROMUCOSAL | Status: DC | PRN
  Administered 2024-01-01 – 2024-01-04 (×10): 2 mg via ORAL
  Filled 2024-01-01 (×2): qty 1

## 2024-01-01 MED ORDER — DIVALPROEX SODIUM 250 MG PO DR TAB
DELAYED_RELEASE_TABLET | ORAL | Status: AC
  Filled 2024-01-01: qty 1

## 2024-01-01 NOTE — Group Note (Signed)
 Date:  01/01/2024 Time:  8:49 PM  Group Topic/Focus:  Wrap-Up Group:   The focus of this group is to help patients review their daily goal of treatment and discuss progress on daily workbooks.    Participation Level:  Active  Participation Quality:  Appropriate and Sharing  Affect:  Appropriate  Cognitive:  Appropriate  Insight: Appropriate  Engagement in Group:  Engaged  Modes of Intervention:  Activity and Socialization  Additional Comments:  Patient used wrap-up group sheets. Patient rated his day a 7 out of 10. Patient goal for today, attend groups. Patient did achieve goal. Coping skills the patient finds most helpful, communication. Something the patient likes about himself, helpful at times.   Ronald [name] 01/01/2024, 8:49 PM

## 2024-01-01 NOTE — [facility] Suicide Risk Assessment (Signed)
 [facility] INPATIENT:  Family/Significant Other Suicide Prevention Education  Suicide Prevention Education:  Education Completed; [name] (Mom) [phone],  (name of family member/significant other) has been identified by the patient as the family member/significant other with whom the patient will be residing, and identified as the person(s) who will aid the patient in the event of a mental health crisis (suicidal ideations/suicide attempt).  With written consent from the patient, the family member/significant other has been provided the following suicide prevention education, prior to the and/or following the discharge of the patient.  Mother came to visit yesterday, reports pt was extremely angry, had an evil look on his face, blackness in his eyes. Mother is fearful of patient being discharged based on visitation yesterday. Pt was talking about making new DNA strands, aliens and extremely fixated on cannabis. Continuously talked about his masters degree in physics and biology although he does not either of those degrees.  Mother reports she did not recognize him yesterday and has never seem him in this state before. Reports being fearful after visitation.     Mother is medical POA. Mother would like to speak with a provider before discussing discharge, reports if he is discharged tomorrow she will IVC him again based on yesterdays behaviors.   Does not have access to weapons or firearms to her knowledge. Mother is familiar with safety education from prior admissions, will continue to be a support person for pt once discharged.  The suicide prevention education provided includes the following: Suicide risk factors Suicide prevention and interventions National Suicide Hotline telephone number [facility] assessment telephone number [facility] Emergency Assistance 911 [facility] and/or Residential Mobile Crisis Unit telephone number  Request made of family/significant other  to: Remove weapons (e.g., guns, rifles, knives), all items previously/currently identified as safety concern.   Remove drugs/medications (over-the-counter, prescriptions, illicit drugs), all items previously/currently identified as a safety concern.  The family member/significant other verbalizes understanding of the suicide prevention education information provided.  The family member/significant other agrees to remove the items of safety concern listed above.  Jenkins LULLA Primer 01/01/2024, 1:30 PM

## 2024-01-01 NOTE — Progress Notes (Signed)
   01/01/24 1100  Psych Admission Type (Psych Patients Only)  Admission Status Involuntary  Psychosocial Assessment  Patient Complaints None  Eye Contact Fair  Facial Expression Animated  Affect Appropriate to circumstance  Speech Unremarkable  Interaction Assertive  Motor Activity Fidgety  Appearance/Hygiene Disheveled  Behavior Characteristics Cooperative  Mood Pleasant  Thought Process  Coherency WDL  Content WDL  Delusions None reported or observed  Perception WDL  Hallucination None reported or observed  Judgment Poor  Confusion None  Danger to Self  Current suicidal ideation? Denies  Description of Suicide Plan No Plan  Agreement Not to Harm Self Yes  Description of Agreement Verbal Contract  Danger to Others  Danger to Others None reported or observed

## 2024-01-01 NOTE — Group Note (Signed)
 LCSW Group Therapy Note   Group Date: 01/01/2024 Start Time: 1100 End Time: 1200   Participation:  patient was present and actively participated in the discussion.  Type of Therapy:  Group Therapy  Topic:  Money Matters: Creating Stability, Confidence and Peace of Mind  Objective: To help participants understand the impact of financial stability on well-being through the lens of Maslow's Hierarchy of Needs and develop practical strategies for budgeting, saving, and debt repayment.  Goals: Increase awareness of spending habits and financial priorities, recognizing how money supports basic needs, security, and relationships. Develop simple budgeting and saving strategies to enhance stability and peace of mind.  Reduce financial stress by creating a realistic debt repayment plan, supporting long-term confidence and well-being.  Summary:  Participants explored how financial stability connects to basic needs, relationships, and self-esteem using Maslow's Hierarchy. They discussed budgeting, saving, and debt repayment strategies, identifying small, manageable changes. Through interactive discussion and self-reflection, they gained insight into their financial habits and created personal action steps for improvement.  Therapeutic Modalities Used: Elements of Cognitive Behavioral Therapy (CBT) - Addressing financial stress and thought patterns. Psychoeducation - Engineer, agricultural. Elements of Motivational Interviewing (MI) - Encouraging realistic, achievable changes. Group Support - Reducing shame and stress through shared experiences.   Pier Bosher O Adaia Matthies, LCSWA 01/01/2024  5:01 PM

## 2024-01-01 NOTE — Plan of Care (Signed)
  Problem: Education: Goal: Emotional status will improve Outcome: Progressing   Problem: Activity: Goal: Interest or engagement in activities will improve Outcome: Progressing   Problem: Coping: Goal: Ability to verbalize frustrations and anger appropriately will improve Outcome: Progressing   Problem: Safety: Goal: Periods of time without injury will increase Outcome: Progressing

## 2024-01-01 NOTE — [facility] Group Notes (Signed)
 The focus of this group is to help patients establish daily goals to achieve during treatment and discuss how the patient can incorporate goal setting into their daily lives to aide in recovery.         Scale 1-10:    8   Goal for the day: Attend all groups

## 2024-01-01 NOTE — Plan of Care (Signed)
 ?  Problem: Education: ?Goal: Mental status will improve ?Outcome: Progressing ?Goal: Verbalization of understanding the information provided will improve ?Outcome: Progressing ?  ?
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
## 2024-01-01 NOTE — Progress Notes (Signed)
(  Sleep Hours) -10.0 (Any PRNs that were needed, meds refused, or side effects to meds)-none  (Any disturbances and when (visitation, over night)-none (Concerns raised by the patient)- none (SI/HI/AVH)- denies all

## 2024-01-02 ENCOUNTER — Encounter (HOSPITAL_COMMUNITY): Payer: Self-pay

## 2024-01-02 LAB — VALPROIC ACID LEVEL: Valproic Acid Lvl: 92 ug/mL (ref 50–100)

## 2024-01-02 MED ORDER — BACITRACIN-NEOMYCIN-POLYMYXIN OINTMENT TUBE
TOPICAL_OINTMENT | Freq: Once | CUTANEOUS | Status: AC
Start: 2024-01-02 — End: 2024-01-02
  Filled 2024-01-02: qty 14.17

## 2024-01-02 MED ORDER — WHITE PETROLATUM EX OINT
TOPICAL_OINTMENT | CUTANEOUS | Status: AC
  Filled 2024-01-02: qty 5

## 2024-01-02 NOTE — [facility] Group Notes (Deleted)
 Adult Psychoeducational Group Note  Date:  01/02/2024 Time:  10:23 AM  Group Topic/Focus:  Goals Group:   The focus of this group is to help patients establish daily goals to achieve during treatment and discuss how the patient can incorporate goal setting into their daily lives to aide in recovery.  Participation Level:  Did Not Attend  Participation Quality:  na  Affect:  na  Cognitive:  na  Insight: na  Engagement in Group:  na  Modes of Intervention:  na  Additional Comments:  Pt did not attend Goals group  Bradshaw Minihan 01/02/2024, 10:23 AM

## 2024-01-02 NOTE — Progress Notes (Signed)
   01/02/24 0926  Psych Admission Type (Psych Patients Only)  Admission Status Involuntary  Psychosocial Assessment  Patient Complaints None  Eye Contact Fair  Facial Expression Animated  Affect Appropriate to circumstance  Speech Logical/coherent  Interaction Assertive  Motor Activity Fidgety  Appearance/Hygiene Disheveled  Behavior Characteristics Cooperative;Appropriate to situation  Mood Pleasant  Thought Process  Coherency WDL  Content WDL  Delusions None reported or observed  Perception WDL  Hallucination None reported or observed  Judgment Poor  Confusion None  Danger to Self  Current suicidal ideation? Denies  Agreement Not to Harm Self Yes  Description of Agreement Verbal  Danger to Others  Danger to Others None reported or observed

## 2024-01-02 NOTE — [facility] Group Notes (Signed)
 [facility] Group Notes:  (Nursing/MHT/Case Management/Adjunct)  Date:  01/02/2024  Time: 2000  Type of Therapy:  Alcoholic Anonymous Meeting  Participation Level:  Active  Participation Quality:  Appropriate, Attentive, and Supportive  Affect:  Appropriate  Cognitive:  Alert  Insight:  Improving  Engagement in Group:  Engaged  Modes of Intervention:  Clarification, Education, and Support  Summary of Progress/Problems:  Lenora Manuelita RAMAN 01/02/2024, 9:59 PM

## 2024-01-02 NOTE — [facility] Group Notes (Signed)
 Adult Psychoeducational Group Note  Date:  01/02/2024 Time:  10:25 AM  Group Topic/Focus:  Goals Group:   The focus of this group is to help patients establish daily goals to achieve during treatment and discuss how the patient can incorporate goal setting into their daily lives to aide in recovery.  Participation Level:  Active  Participation Quality:  Appropriate  Affect:  Appropriate  Cognitive:  Appropriate  Insight: Appropriate  Engagement in Group:  Engaged  Modes of Intervention:  Exploration  Additional Comments:  Pt participated in Goals group. Pt stated his goal is to attend groups, think positive and decrease negative thoughts.    Jenafer Winterton 01/02/2024, 10:25 AM

## 2024-01-02 NOTE — Progress Notes (Signed)
   01/02/24 0902  Spiritual Encounters  Type of Visit Follow up  Care provided to: Patient  Referral source Chaplain assessment  Reason for visit Routine spiritual support   While rounding on unit I engaged [name] partly because he had presented to nurse's station with a cut.  [name] welcomed a chance to chat and recalled with me some things from our conversation earlier this week.He shared what he was reading and seemed calm and coping relatively well overall.  I offered active listening and relational support. I will continue to follow.  [name] L. Delores HERO.Div

## 2024-01-02 NOTE — BH IP Treatment Plan (Signed)
 Interdisciplinary Treatment and Diagnostic Plan Update  01/02/2024 Time of Session: 9:10AM - UPDATE Ronald [name] MRN: [id number]  Principal Diagnosis: Schizoaffective disorder, bipolar type (HCC)  Secondary Diagnoses: Principal Problem:   Schizoaffective disorder, bipolar type (HCC) Active Problems:   PTSD (post-traumatic stress disorder)   Current Medications:  Current Facility-Administered Medications  Medication Dose Route Frequency Provider Last Rate Last Admin   acetaminophen  (TYLENOL ) tablet 650 mg  650 mg Oral Q6H PRN Motley-Mangrum, Jadeka A, PMHNP   650 mg at 12/30/23 1630   alum & mag hydroxide-simeth (MAALOX/MYLANTA) 200-200-20 MG/5ML suspension 30 mL  30 mL Oral Q4H PRN Motley-Mangrum, Jadeka A, PMHNP       benztropine  (COGENTIN ) tablet 1 mg  1 mg Oral BID PRN Motley-Mangrum, Jadeka A, PMHNP       haloperidol  (HALDOL ) tablet 5 mg  5 mg Oral TID PRN Rollene Katz, MD       And   diphenhydrAMINE  (BENADRYL ) capsule 50 mg  50 mg Oral TID PRN Rollene Katz, MD       haloperidol  lactate (HALDOL ) injection 5 mg  5 mg Intramuscular TID PRN Rollene Katz, MD   5 mg at 12/31/23 1650   And   diphenhydrAMINE  (BENADRYL ) injection 50 mg  50 mg Intramuscular TID PRN Rollene Katz, MD   50 mg at 12/31/23 1650   And   LORazepam  (ATIVAN ) injection 2 mg  2 mg Intramuscular TID PRN Rollene Katz, MD   2 mg at 12/31/23 1650   haloperidol  lactate (HALDOL ) injection 10 mg  10 mg Intramuscular TID PRN Rollene Katz, MD       And   diphenhydrAMINE  (BENADRYL ) injection 50 mg  50 mg Intramuscular TID PRN Rollene Katz, MD       And   LORazepam  (ATIVAN ) injection 2 mg  2 mg Intramuscular TID PRN Rollene Katz, MD       divalproex  (DEPAKOTE ) 250 MG DR tablet            gabapentin  (NEURONTIN ) capsule 300 mg  300 mg Oral Q12H Lynnette Barter, MD   300 mg at 01/02/24 0800   haloperidol  (HALDOL ) 2 MG/ML solution 15 mg  15 mg Oral QHS Rollene Katz, MD   15 mg  at 01/01/24 2240   magnesium  hydroxide (MILK OF MAGNESIA) suspension 30 mL  30 mL Oral Daily PRN Motley-Mangrum, Jadeka A, PMHNP       magnesium  oxide (MAG-OX) tablet 400 mg  400 mg Oral QHS Rollene Katz, MD   400 mg at 01/01/24 2042   melatonin tablet 5 mg  5 mg Oral QHS Ji, Andrew, MD   5 mg at 01/01/24 2043   nicotine  (NICODERM CQ  - dosed in mg/24 hours) patch 21 mg  21 mg Transdermal Daily Motley-Mangrum, Jadeka A, PMHNP   21 mg at 01/02/24 0802   nicotine  polacrilex (NICORETTE ) gum 2 mg  2 mg Oral PRN Towana Leita SAILOR, MD   2 mg at 01/02/24 9387   traZODone  (DESYREL ) tablet 50 mg  50 mg Oral QHS PRN,MR X 1 Ji, Andrew, MD   50 mg at 01/01/24 2042   valproic  acid (DEPAKENE ) 250 MG/5ML solution 1,000 mg  1,000 mg Oral BID Trudy Carwin, NP   1,000 mg at 01/02/24 0800   PTA Medications: Medications Prior to Admission  Medication Sig Dispense Refill Last Dose/Taking   benztropine  (COGENTIN ) 1 MG tablet Take 1 tablet (1 mg total) by mouth 2 (two) times daily as needed (with haldol  during agitation protocol not to  exceed 2 mg daily). (Patient not taking: Reported on 12/26/2023) 60 tablet 0    divalproex  (DEPAKOTE  ER) 500 MG 24 hr tablet Take 2 tablets (1,000 mg total) by mouth 2 (two) times daily. 120 tablet 0    gabapentin  (NEURONTIN ) 100 MG capsule Take 2 capsules (200 mg total) by mouth 3 (three) times daily. 180 capsule 0    haloperidol  (HALDOL ) 10 MG tablet Take 1 tablet (10 mg total) by mouth at bedtime. 30 tablet 0    magnesium  oxide (MAG-OX) 400 (240 Mg) MG tablet Take 400 mg by mouth at bedtime.      Multiple Vitamins-Minerals (MULTIVITAMIN WITH MINERALS) tablet Take 1 tablet by mouth daily.      nicotine  (NICODERM CQ  - DOSED IN MG/24 HOURS) 14 mg/24hr patch Place 14 mg onto the skin daily.      traZODone  (DESYREL ) 50 MG tablet Take 50 mg by mouth at bedtime.       Patient Stressors: Medication change or noncompliance    Patient Strengths: Manufacturing systems engineer  Supportive  family/friends   Treatment Modalities: Medication Management, Group therapy, Case management,  1 to 1 session with clinician, Psychoeducation, Recreational therapy.   Physician Treatment Plan for Primary Diagnosis: Schizoaffective disorder, bipolar type (HCC) Long Term Goal(s):     Short Term Goals:    Medication Management: Evaluate patient's response, side effects, and tolerance of medication regimen.  Therapeutic Interventions: 1 to 1 sessions, Unit Group sessions and Medication administration.  Evaluation of Outcomes: Progressing  Physician Treatment Plan for Secondary Diagnosis: Principal Problem:   Schizoaffective disorder, bipolar type (HCC) Active Problems:   PTSD (post-traumatic stress disorder)  Long Term Goal(s):     Short Term Goals:       Medication Management: Evaluate patient's response, side effects, and tolerance of medication regimen.  Therapeutic Interventions: 1 to 1 sessions, Unit Group sessions and Medication administration.  Evaluation of Outcomes: Progressing   RN Treatment Plan for Primary Diagnosis: Schizoaffective disorder, bipolar type (HCC) Long Term Goal(s): Knowledge of disease and therapeutic regimen to maintain health will improve  Short Term Goals: Ability to remain free from injury will improve, Ability to verbalize frustration and anger appropriately will improve, Ability to demonstrate self-control, Ability to participate in decision making will improve, Ability to verbalize feelings will improve, and Ability to disclose and discuss suicidal ideas  Medication Management: RN will administer medications as ordered by provider, will assess and evaluate patient's response and provide education to patient for prescribed medication. RN will report any adverse and/or side effects to prescribing provider.  Therapeutic Interventions: 1 on 1 counseling sessions, Psychoeducation, Medication administration, Evaluate responses to treatment, Monitor  vital signs and CBGs as ordered, Perform/monitor CIWA, COWS, AIMS and Fall Risk screenings as ordered, Perform wound care treatments as ordered.  Evaluation of Outcomes: Progressing   LCSW Treatment Plan for Primary Diagnosis: Schizoaffective disorder, bipolar type (HCC) Long Term Goal(s): Safe transition to appropriate next level of care at discharge, Engage patient in therapeutic group addressing interpersonal concerns.  Short Term Goals: Engage patient in aftercare planning with referrals and resources, Increase social support, Increase ability to appropriately verbalize feelings, Increase emotional regulation, Facilitate acceptance of mental health diagnosis and concerns, and Increase skills for wellness and recovery  Therapeutic Interventions: Assess for all discharge needs, 1 to 1 time with Social worker, Explore available resources and support systems, Assess for adequacy in community support network, Educate family and significant other(s) on suicide prevention, Complete Psychosocial Assessment, Interpersonal group therapy.  Evaluation  of Outcomes: Progressing   Progress in Treatment: Attending groups: Yes. Participating in groups: Yes. Taking medication as prescribed: Yes. Toleration medication: Yes. Family/Significant other contact made: No, will contact:  Jenkins Romano) [phone] Patient understands diagnosis: Yes. Discussing patient identified problems/goals with staff: Yes. Medical problems stabilized or resolved: Yes. Denies suicidal/homicidal ideation: Yes. Issues/concerns per patient self-inventory: No. Patient Goals:  my medication regulation   Discharge Plan or Barriers: Patient likely to discharge home once stable.    Reason for Continuation of Hospitalization: Medication stabilization   Estimated Length of Stay: 5-7 days  Last 3 [place] Suicide Severity Risk Score: Flowsheet Row [facility] ED from 12/26/2023 in [facility] Admission (Discharged) from 10/13/2023 in [facility]INE  C-SSRS RISK CATEGORY No Risk No Risk No Risk    Last PHQ 2/9 Scores:    06/13/2023    8:06 AM 11/08/2022   10:00 AM 01/14/2022    1:09 PM  Depression screen PHQ 2/9  Decreased Interest 0 0 0  Down, Depressed, Hopeless 0 0 0  PHQ - 2 Score 0 0 0  Altered sleeping 0 0 0  Tired, decreased energy 0 0 0  Change in appetite 0 0 0  Feeling bad or failure about yourself  0 0 0  Trouble concentrating 0 0 0  Moving slowly or fidgety/restless 0 0 0  Suicidal thoughts 0 0 0  PHQ-9 Score 0 0 0  Difficult doing work/chores Not difficult at all      Scribe for Treatment Team: Herlinda Heady M Elyzabeth Goatley, LCSWA 01/02/2024 8:22 AM
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
## 2024-01-02 NOTE — Group Note (Signed)
 Recreation Therapy Group Note   Group Topic:Problem Solving  Group Date: 01/02/2024 Start Time: 0936 End Time: 1002 Facilitators: Suhaib Guzzo-McCall, LRT,CTRS Location: [address] Dayroom   Group Topic: Communication, Team Building, Problem Solving  Goal Area(s) Addresses:  Patient will effectively work with peer towards shared goal.  Patient will identify skills used to make activity successful.  Patient will identify how skills used during activity can be used to reach post d/c goals.   Behavioral Response:   Intervention: STEM Activity  Activity: Straw Bridge. In teams of 3-5, patients were given 15 plastic drinking straws and an equal length of masking tape. Using the materials provided, patients were instructed to build a free standing bridge-like structure to suspend an everyday item (ex: puzzle box) off of the floor or table surface. All materials were required to be used by the team in their design. LRT facilitated post-activity discussion reviewing team process. Patients were encouraged to reflect how the skills used in this activity can be generalized to daily life post discharge.   Education: Pharmacist, community, Scientist, physiological, Discharge Planning   Education Outcome: Acknowledges education/In group clarification offered/Needs additional education.    Affect/Mood: N/A   Participation Level: Did not attend    Clinical Observations/Individualized Feedback:      Plan: Continue to engage patient in RT group sessions 2-3x/week.   Makylee Sanborn-McCall, LRT,CTRS 01/02/2024 12:42 PM

## 2024-01-02 NOTE — Progress Notes (Signed)
(  Sleep Hours) -10.5 (Any PRNs that were needed, meds refused, or side effects to meds)- none (Any disturbances and when (visitation, over night)- none (Concerns raised by the patient)- concerned about  haldol  and Depakote  changed to liquid. (SI/HI/AVH)- denies

## 2024-01-02 NOTE — Plan of Care (Signed)
   Problem: Education: Goal: Mental status will improve Outcome: Progressing Goal: Verbalization of understanding the information provided will improve Outcome: Progressing   Problem: Activity: Goal: Interest or engagement in activities will improve Outcome: Progressing

## 2024-01-03 MED ORDER — DIVALPROEX SODIUM 500 MG PO DR TAB
1000.0000 mg | DELAYED_RELEASE_TABLET | Freq: Two times a day (BID) | ORAL | Status: DC
  Administered 2024-01-03 – 2024-01-04 (×2): 1000 mg via ORAL
  Filled 2024-01-03: qty 2
  Filled 2024-01-03: qty 28
  Filled 2024-01-03: qty 2

## 2024-01-03 MED ORDER — HALOPERIDOL 5 MG PO TABS
15.0000 mg | ORAL_TABLET | Freq: Every day | ORAL | Status: DC
  Administered 2024-01-03: 15 mg via ORAL
  Filled 2024-01-03: qty 21
  Filled 2024-01-03: qty 3
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
## 2024-01-03 NOTE — Progress Notes (Signed)
   01/03/24 0852  Psych Admission Type (Psych Patients Only)  Admission Status Involuntary  Psychosocial Assessment  Patient Complaints None  Eye Contact Fair  Facial Expression Animated  Affect Appropriate to circumstance  Speech Logical/coherent  Interaction Assertive  Motor Activity Fidgety  Appearance/Hygiene Disheveled  Behavior Characteristics Cooperative;Appropriate to situation  Mood Pleasant  Thought Process  Coherency WDL  Content WDL  Delusions None reported or observed  Perception WDL  Hallucination None reported or observed  Judgment Impaired  Confusion None  Danger to Self  Current suicidal ideation? Denies  Description of Suicide Plan No plan  Agreement Not to Harm Self Yes  Description of Agreement Pt verbally contracts safety  Danger to Others  Danger to Others None reported or observed

## 2024-01-03 NOTE — Plan of Care (Signed)
   Problem: Education: Goal: Emotional status will improve Outcome: Progressing Goal: Mental status will improve Outcome: Progressing Goal: Verbalization of understanding the information provided will improve Outcome: Progressing   Problem: Activity: Goal: Interest or engagement in activities will improve Outcome: Progressing

## 2024-01-03 NOTE — Group Note (Signed)
 Type of Therapy/Topic:  Group Therapy:  Mindfulness Participation Level:  Active  Mood:  Description of Group:    The purpose of this group is to assist patients to understand mindfulness in learning how to being in the present moment. Patients will be guided to discuss ways in which they have been aware of their thoughts, feelings, and sensation. Also, the patient will be educated on acceptance, noticing their experience without judging or trying to change it. The patient will ask to participate in mindfulness taste with using skittles. Special emphasis will be placed on what the patients would miss if they are not mindful, and patients will process healthy conflict resolution skills.  Therapeutic Goals: Patient will identify an environment where mindful can be useful to them.  Patient will label two or more mindfulness techniques that use.  Patient will be able to demonstrate mindfulness skills through discussion.  Summary of Patient Progress: The patient was pleasant and engaged in group. The patient share that he likes to read for his mindfulness to destress.  Therapeutic Modalities:   Cognitive Behavioral Therapy Feelings Identification Dialectical Behavioral Therapy   Shalay Carder O Karinne Schmader, LCSWA

## 2024-01-03 NOTE — Progress Notes (Signed)
(  Sleep Hours) - 7.5 hours (Any PRNs that were needed, meds refused, or side effects to meds)- none (Any disturbances and when (visitation, over night) (Concerns raised by the patient)- none (SI/HI/AVH)- denies

## 2024-01-03 NOTE — Plan of Care (Signed)
  Problem: Education: Goal: Emotional status will improve Outcome: Progressing Goal: Mental status will improve Outcome: Progressing Goal: Verbalization of understanding the information provided will improve Outcome: Progressing   Problem: Activity: Goal: Interest or engagement in activities will improve Outcome: Progressing Goal: Sleeping patterns will improve Outcome: Progressing   Problem: Coping: Goal: Ability to verbalize frustrations and anger appropriately will improve Outcome: Progressing Goal: Ability to demonstrate self-control will improve Outcome: Progressing   Problem: Health Behavior/Discharge Planning: Goal: Identification of resources available to assist in meeting health care needs will improve Outcome: Progressing Goal: Compliance with treatment plan for underlying cause of condition will improve Outcome: Progressing   Problem: Physical Regulation: Goal: Ability to maintain clinical measurements within normal limits will improve Outcome: Progressing   Problem: Safety: Goal: Periods of time without injury will increase Outcome: Progressing

## 2024-01-03 NOTE — Group Note (Signed)
 Date:  01/03/2024 Time:  3:40 PM  Group Topic/Focus: Sleep hygiene Wellness Toolbox:   The focus of this group is to discuss various aspects of wellness, balancing those aspects and exploring ways to increase the ability to experience wellness.  Patients will create a wellness toolbox for use upon discharge.    Participation Level:  Active  Participation Quality:  Appropriate  Affect:  Appropriate  Cognitive:  Appropriate  Insight: Appropriate  Engagement in Group:  Engaged  Modes of Intervention:  Discussion and Education  Additional Comments:  Patient attended group and was actively engaged.   Ronald [name] 01/03/2024, 3:40 PM

## 2024-01-03 NOTE — Progress Notes (Signed)
   01/03/24 2030  Psych Admission Type (Psych Patients Only)  Admission Status Involuntary  Psychosocial Assessment  Patient Complaints None  Eye Contact Fair  Facial Expression Flat  Affect Appropriate to circumstance  Speech Logical/coherent  Interaction Assertive  Motor Activity Slow  Appearance/Hygiene Disheveled  Behavior Characteristics Cooperative;Appropriate to situation  Mood Pleasant  Thought Process  Coherency WDL  Content WDL  Delusions None reported or observed  Perception WDL  Hallucination None reported or observed  Judgment Poor  Confusion None  Danger to Self  Current suicidal ideation? Denies

## 2024-01-04 MED ORDER — NICOTINE 21 MG/24HR TD PT24: 21.0000 mg | MEDICATED_PATCH | Freq: Every day | TRANSDERMAL | 0 refills | Status: DC

## 2024-01-04 MED ORDER — GABAPENTIN 300 MG PO CAPS: 300.0000 mg | ORAL_CAPSULE | Freq: Two times a day (BID) | ORAL | 0 refills | Status: DC

## 2024-01-04 MED ORDER — HALOPERIDOL 5 MG PO TABS: 15.0000 mg | ORAL_TABLET | Freq: Every day | ORAL | 0 refills | Status: DC

## 2024-01-04 MED ORDER — NICOTINE POLACRILEX 2 MG MT GUM: 2.0000 mg | CHEWING_GUM | OROMUCOSAL | 0 refills | Status: DC | PRN

## 2024-01-04 MED ORDER — BENZTROPINE MESYLATE 1 MG PO TABS: 1.0000 mg | ORAL_TABLET | Freq: Two times a day (BID) | ORAL | 0 refills | Status: DC | PRN

## 2024-01-04 MED ORDER — MELATONIN 5 MG PO TABS: 5.0000 mg | ORAL_TABLET | Freq: Every day | ORAL | Status: DC

## 2024-01-04 MED ORDER — DIVALPROEX SODIUM 500 MG PO DR TAB: 1000.0000 mg | DELAYED_RELEASE_TABLET | Freq: Two times a day (BID) | ORAL | 0 refills | Status: DC

## 2024-01-04 MED ORDER — TRAZODONE HCL 50 MG PO TABS: 50.0000 mg | ORAL_TABLET | Freq: Every evening | ORAL | 0 refills | Status: DC | PRN

## 2024-01-04 NOTE — Progress Notes (Signed)
  [facility] Adult Case Management Discharge Plan :  Will you be returning to the same living situation after discharge:  Yes,  patient will return home.  At discharge, do you have transportation home?: Yes,  patient's mother to transport. Do you have the ability to pay for your medications: Yes,  patient pays out of pocket for medications and provided with samples from pharmacist.    Release of information consent forms completed and in the chart;  Patient's signature needed at discharge.  Patient to Follow up at:  Follow-up Information     [facility]. Go on 01/13/2024.   Specialty: Behavioral Health Why: Please go to this provider on 01/13/24 at 7:00 am for an assessment, to obtain medication management services. You may also go on Monday through Friday, arrive by 7:00 am. Contact information: [address] [place]  [address] [phone]        [facility]. Go on 01/06/2024.   Specialty: Professional Counselor Why: Please go to this provider on 01/06/24 at 9:00 am for an assessment, to obtain therapy services. You may also go on Monday through Friday, from 9 am to 1 pm. Contact information: [address] [place]  [address] [name] [address] [phone]         Joshua Debby CROME, MD. Go on 02/02/2024.   Specialty: Internal Medicine Why: You have an appointment with your primary care provider on 02/02/24 at 11:20 am, in person.  Please bring any insurance card you have with you and pay your payment or copayment at the time of service. Contact information: [address] [name] [address] [phone]                 Next level of care provider has access to [facility] Link:no  Safety Planning and Suicide Prevention discussed: Yes,  SPE completed with [name] (Mom) [phone]   Has patient been referred to the Quitline?: Patient refused referral for treatment  Patient has been referred for addiction  treatment: Patient refused referral for treatment.  Hunter JONELLE Lever, LCSWA 01/04/2024, 5:45 PM

## 2024-01-04 NOTE — Progress Notes (Signed)
(  Sleep Hours) -7.75 (Any PRNs that were needed, meds refused, or side effects to meds)- trazadone, tylenol , nicorette  gum (Any disturbances and when (visitation, over night)-none (Concerns raised by the patient)- none (SI/HI/AVH)-denies all

## 2024-01-04 NOTE — Discharge Instructions (Signed)
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
## 2024-01-04 NOTE — Progress Notes (Signed)
 Patient verbalizes readiness for discharge. All patient belongings returned to patient. Discharge instructions read and discussed with patient (appointments, medications, resources). Patient expressed gratitude for care provided. Patient discharged to lobby at [address] where his mother was waiting.

## 2024-01-04 NOTE — BHH Suicide Risk Assessment (Signed)
 [facility] Discharge Suicide Risk Assessment   Principal Problem: Schizoaffective disorder, bipolar type [name]) Discharge Diagnoses: Principal Problem:   Schizoaffective disorder, bipolar type (HCC) Active Problems:   PTSD (post-traumatic stress disorder)  Ronald [name] is a 43 y.o., male with history of schizoaffective disorder, bipolar type, GAD, PTSD, cannabis use disorder admitted to [facility] under IVC due to paranoia and not taking care of personal hygiene.   Total Time spent with patient: 20 minutes  During the patient's hospitalization, patient had extensive initial psychiatric evaluation, and follow-up psychiatric evaluations every day.  Psychiatric diagnoses provided upon initial assessment:  Schizoaffective disorder bipolar type Cannabis use disorder  Patient's psychiatric medications were adjusted on admission:  estart haloperidol  10 mg nightly             -A1c and lipid profile ordered - Restart benztropine  1 mg twice daily PRN for EPS - Restart Depakote  ER 1000 mg every 12 hours - Restart melatonin 5 mg nightly - Restart trazodone  50 to 100 mg nightly as needed for insomnia - Consolidate gabapentin  to 300 mg twice daily  During the hospitalization, other adjustments were made to the patient's psychiatric medication regimen:  --Increased Haldol  15 mg at bedtime for psychosis  - Depakote  DR 1000 mg twice daily for mood stabilization   Patient's care was discussed during the interdisciplinary team meeting every day during the hospitalization.  The patient denied  having side effects to prescribed psychiatric medication.  Gradually, patient started adjusting to milieu. The patient was evaluated each day by a clinical provider to ascertain response to treatment. Improvement was noted by the patient's report of decreasing symptoms, improved sleep and appetite, affect, medication tolerance, behavior, and participation in unit programming.  Patient was asked each day to complete a  self inventory noting mood, mental status, pain, new symptoms, anxiety and concerns.    Symptoms were reported as significantly decreased or resolved completely by discharge.   On day of discharge, the patient reports that their mood is stable. The patient denied having suicidal thoughts for more than 48 hours prior to discharge.  Patient denies having homicidal thoughts.  Patient denies having auditory hallucinations.  Patient denies any visual hallucinations or other symptoms of psychosis. The patient was motivated to continue taking medication with a goal of continued improvement in mental health. Patient reports issues with getting medications refilled with primary care provider. Notes that once discharged he only gets 1 month of prescriptions. By the time, he follows up with his primary provider he has run out medications and that leads to him decompensating and being re-hospitalized. Disclosed to patient about follow-up assessment  with psychiatric provider. If unable to make assessment he was made aware of hours to walk in for medication management. Patient aware and voiced understanding about follow-up plan.   The patient reports their target psychiatric symptoms of attending to personal hygiene, paranoia and delusional statements responded well to the psychiatric medications, and the patient reports overall benefit other psychiatric hospitalization. Supportive psychotherapy was provided to the patient. The patient also participated in regular group therapy while hospitalized. Coping skills, problem solving as well as relaxation therapies were also part of the unit programming.  Labs were reviewed with the patient, and abnormal results were discussed with the patient.  The patient is able to verbalize their individual safety plan to this provider.  # It is recommended to the patient to continue psychiatric medications as prescribed, after discharge from the hospital.    # It is recommended to  the patient to follow up with your outpatient psychiatric provider and PCP.  # It was discussed with the patient, the impact of alcohol, drugs, tobacco have been there overall psychiatric and medical wellbeing, and total abstinence from substance use was recommended the patient.ed.  # Prescriptions provided or sent directly to preferred pharmacy at discharge. Patient agreeable to plan. Given opportunity to ask questions. Appears to feel comfortable with discharge.    # In the event of worsening symptoms, the patient is instructed to call the crisis hotline, 911 and or go to the nearest ED for appropriate evaluation and treatment of symptoms. To follow-up with primary care provider for other medical issues, concerns and or health care needs  # Patient was discharged home with a plan to follow up as noted below.   Musculoskeletal: Strength & Muscle Tone: within normal limits Gait & Station: normal Patient leans: N/A  Psychiatric Specialty Exam  Presentation  General Appearance:  Appropriate for Environment; Casual  Eye Contact: Good  Speech: Clear and Coherent  Speech Volume: Normal  Handedness: Right   Mood and Affect  Mood: Euthymic  Duration of Depression Symptoms: Less than two weeks  Affect: Appropriate; Congruent   Thought Process  Thought Processes: Coherent  Descriptions of Associations:Intact  Orientation:Full (Time, Place and Person)  Thought Content:Logical  History of Schizophrenia/Schizoaffective disorder:Yes  Duration of Psychotic Symptoms:N/A  Hallucinations:Hallucinations: None  Ideas of Reference:None  Suicidal Thoughts:Suicidal Thoughts: No  Homicidal Thoughts:Homicidal Thoughts: No   Sensorium  Memory: Immediate Good; Recent Good  Judgment: Intact  Insight: Fair   Art therapist  Concentration: Fair  Attention Span: Fair  Recall: Fair  Fund of Knowledge: Good  Language: Good   Psychomotor Activity   Psychomotor Activity:Psychomotor Activity: Normal   Assets  Assets: Financial Resources/Insurance; Physical Health; Housing; Communication Skills   Sleep  Sleep: Sleep: Good  Estimated Sleeping Duration (Last 24 Hours): 7.25-8.75 hours  Physical Exam: Physical Exam Constitutional:      Appearance: Normal appearance.  Pulmonary:     Effort: Pulmonary effort is normal.  Musculoskeletal:        General: Normal range of motion.  Neurological:     Mental Status: He is oriented to person, place, and time.    Review of Systems  Constitutional:  Negative for chills and fever.  Gastrointestinal:  Negative for nausea and vomiting.  Psychiatric/Behavioral:  Negative for depression, hallucinations and suicidal ideas. The patient is not nervous/anxious.    Blood pressure 125/82, pulse 81, temperature 97.7 F (36.5 C), temperature source Oral, resp. rate 18, height 5' 11 (1.803 m), weight 68.9 kg, SpO2 99%. Body mass index is 21.2 kg/m.  Mental Status Per Nursing Assessment::   On Admission:  NA  Demographic Factors:  Male, Caucasian, Low socioeconomic status, and Unemployed  Loss Factors: NA  Historical Factors: Family history of mental illness or substance abuse  Risk Reduction Factors:   Living with another person, especially a relative and Positive social support  Continued Clinical Symptoms:  Patient denying any mood symptoms, has been appropriate on the floor and engaged in group sessions, does not appear to be RIS and is organized with thought process   Cognitive Features That Contribute To Risk:  None    Suicide Risk:  Minimal: No identifiable suicidal ideation.  Patients presenting with no risk factors but with morbid ruminations; may be classified as minimal risk based on the severity of the depressive symptoms    Follow-up Information     [facility]. Go on 01/13/2024.   Specialty: Behavioral Health Why: Please go to this  provider on 01/13/24 at 7:00 am for an assessment, to obtain medication management services. You may also go on Monday through Friday, arrive by 7:00 am. Contact information: [address] [place]  [address] [phone]        [facility] Go on 01/06/2024.   Specialty: Professional Counselor Why: Please go to this provider on 01/06/24 at 9:00 am for an assessment, to obtain therapy services. You may also go on Monday through Friday, from 9 am to 1 pm. Contact information: [address] [place]  [address] [name] [address] [phone]         Joshua Debby CROME, MD. Go on 02/02/2024.   Specialty: Internal Medicine Why: You have an appointment with your primary care provider on 02/02/24 at 11:20 am, in person.  Please bring any insurance card you have with you and pay your payment or copayment at the time of service. Contact information: [address] [name] [address] [phone]                Plan Of Care/Follow-up recommendations:   Activity: as tolerated  Diet: heart healthy  Other: -Follow-up with your outpatient psychiatric provider -instructions on appointment date, time, and address (location) are provided to you in discharge paperwork.  -Take your psychiatric medications as prescribed at discharge - instructions are provided to you in the discharge paperwork  -Follow-up with outpatient primary care doctor and other specialists -for management of preventative medicine and chronic medical disease  -Testing: Follow-up with outpatient provider for abnormal lab results:  VPA level:  92 (9/19)  UDS: THC positive   -If you are prescribed an atypical antipsychotic medication, we recommend that your outpatient psychiatrist follow routine screening for side effects within 3 months of discharge, including monitoring: AIMS scale, height, weight, blood pressure, fasting lipid panel, HbA1c, and fasting blood sugar.   -Recommend  total abstinence from alcohol, tobacco, and other illicit drug use at discharge.   -If your psychiatric symptoms recur, worsen, or if you have side effects to your psychiatric medications, call your outpatient psychiatric provider, 911, [phone] or go to the nearest emergency department.  -If suicidal thoughts occur, immediately call your outpatient psychiatric provider, 911, [phone] or go to the nearest emergency department.   PATTI OLDEN, MD 01/04/2024, 9:16 AM

## 2024-01-29 ENCOUNTER — Other Ambulatory Visit: Payer: Self-pay | Admitting: Internal Medicine

## 2024-01-29 NOTE — Telephone Encounter (Signed)
 Copied from CRM [id number]. Topic: Clinical - Medication Refill >> Jan 29, 2024  9:59 AM Thersia C wrote: Medication: divalproex  (DEPAKOTE ) 500 MG DR tablet traZODone  (DESYREL ) 50 MG tablet gabapentin  (NEURONTIN ) 300 MG capsule  Has the patient contacted their pharmacy? Yes (Agent: If no, request that the patient contact the pharmacy for the refill. If patient does not wish to contact the pharmacy document the reason why and proceed with request.) (Agent: If yes, when and what did the pharmacy advise?)  This is the patient's preferred pharmacy:  [facility] [address], [name] - [address] [place] [address] [name] [address] Phone: [phone] Fax: [phone]  Is this the correct pharmacy for this prescription? Yes If no, delete pharmacy and type the correct one.   Has the prescription been filled recently? No  Is the patient out of the medication? Yes  Has the patient been seen for an appointment in the last year OR does the patient have an upcoming appointment? Yes  Can we respond through MyChart? Yes  Agent: Please be advised that Rx refills may take up to 3 business days. We ask that you follow-up with your pharmacy.

## 2024-02-02 ENCOUNTER — Inpatient Hospital Stay: Payer: Self-pay | Admitting: Internal Medicine

## 2024-02-02 ENCOUNTER — Inpatient Hospital Stay: Payer: Self-pay | Admitting: Emergency Medicine

## 2024-02-04 ENCOUNTER — Telehealth: Payer: Self-pay

## 2024-02-04 NOTE — Telephone Encounter (Signed)
 Copied from CRM [id number]. Topic: Clinical - Medication Question >> Feb 04, 2024 12:04 PM Laymon HERO wrote: Reason for CRM: Patient asking for Dr Joshua to call him to discuss medications that have not been sent to his pharmacy. 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 He is claiming he is contacting his attorney.

## 2024-02-05 ENCOUNTER — Ambulatory Visit: Payer: Self-pay

## 2024-02-05 NOTE — Telephone Encounter (Signed)
 Mother of pt calling stating that she would like Dr Joshua to see pt before giving him refills. Mother states that pt has not been showering, pt is losing weight per caller.  Mother states that she cannot continue going thru this with him.  Pt has not been taking his haldol . Pt is not with caller. Mother states she is POH and POA. Pt states she has dropped the paperwork at the clinic. Pt would like staff to call her back at cell phone [phone], states that if I start talking funny it is because he is by me. Caller asking for help with [name]. Caller does not feel that pt is harm to self or other.   Copied from CRM [id number]. Topic: General - Other >> Feb 05, 2024 12:55 PM Thersia BROCKS wrote: Reason for CRM: Patient mom, Ronald [name]. patient stated she is very concerned about her son and his mental health and needs to speak to someone as soon as possible   [phone]

## 2024-02-09 ENCOUNTER — Other Ambulatory Visit: Payer: Self-pay

## 2024-02-09 ENCOUNTER — Observation Stay (HOSPITAL_COMMUNITY)
Admission: EM | Admit: 2024-02-09 | Discharge: 2024-02-10 | Disposition: A | Discharge status: Home / Self Care | Payer: Self-pay | Attending: Internal Medicine | Admitting: Internal Medicine

## 2024-02-09 ENCOUNTER — Emergency Department (HOSPITAL_COMMUNITY): Payer: Self-pay

## 2024-02-09 DIAGNOSIS — F3112 Bipolar disorder, current episode manic without psychotic features, moderate: Secondary | ICD-10-CM | POA: Diagnosis present

## 2024-02-09 DIAGNOSIS — F4310 Post-traumatic stress disorder, unspecified: Secondary | ICD-10-CM | POA: Insufficient documentation

## 2024-02-09 DIAGNOSIS — S0990XA Unspecified injury of head, initial encounter: Principal | ICD-10-CM | POA: Insufficient documentation

## 2024-02-09 DIAGNOSIS — R55 Syncope and collapse: Secondary | ICD-10-CM | POA: Insufficient documentation

## 2024-02-09 DIAGNOSIS — W19XXXA Unspecified fall, initial encounter: Secondary | ICD-10-CM | POA: Insufficient documentation

## 2024-02-09 DIAGNOSIS — F411 Generalized anxiety disorder: Secondary | ICD-10-CM | POA: Insufficient documentation

## 2024-02-09 DIAGNOSIS — R079 Chest pain, unspecified: Secondary | ICD-10-CM

## 2024-02-09 DIAGNOSIS — R0789 Other chest pain: Secondary | ICD-10-CM | POA: Diagnosis present

## 2024-02-09 DIAGNOSIS — F250 Schizoaffective disorder, bipolar type: Secondary | ICD-10-CM | POA: Insufficient documentation

## 2024-02-09 LAB — COMPREHENSIVE METABOLIC PANEL WITH GFR
ALT: 10 U/L (ref 0–44)
AST: 11 U/L — ABNORMAL LOW (ref 15–41)
Albumin: 3.3 g/dL — ABNORMAL LOW (ref 3.5–5.0)
Alkaline Phosphatase: 34 U/L — ABNORMAL LOW (ref 38–126)
Anion gap: 9 (ref 5–15)
BUN: 7 mg/dL (ref 6–20)
CO2: 26 mmol/L (ref 22–32)
Calcium: 8.3 mg/dL — ABNORMAL LOW (ref 8.9–10.3)
Chloride: 99 mmol/L (ref 98–111)
Creatinine, Ser: 1.12 mg/dL (ref 0.61–1.24)
GFR, Estimated: >60 mL/min (ref >60)
Glucose, Bld: 118 mg/dL — ABNORMAL HIGH (ref 70–99)
Potassium: 3.7 mmol/L (ref 3.5–5.1)
Sodium: 134 mmol/L — ABNORMAL LOW (ref 135–145)
Total Bilirubin: 0.2 mg/dL (ref 0.0–1.2)
Total Protein: 6 g/dL — ABNORMAL LOW (ref 6.5–8.1)

## 2024-02-09 LAB — LIPID PANEL
Cholesterol: 96 mg/dL (ref 0–200)
HDL: 43 mg/dL (ref >40)
LDL Cholesterol: 42 mg/dL (ref 0–99)
Total CHOL/HDL Ratio: 2.2 ratio
Triglycerides: 57 mg/dL (ref <150)
VLDL: 11 mg/dL (ref 0–40)

## 2024-02-09 LAB — CBC WITH DIFFERENTIAL/PLATELET
Abs Immature Granulocytes: 0.02 K/uL (ref 0.00–0.07)
Basophils Absolute: 0 K/uL (ref 0.0–0.1)
Basophils Relative: 0 %
Eosinophils Absolute: 0 K/uL (ref 0.0–0.5)
Eosinophils Relative: 0 %
HCT: 40 % (ref 39.0–52.0)
Hemoglobin: 13.6 g/dL (ref 13.0–17.0)
Immature Granulocytes: 0 %
Lymphocytes Relative: 38 %
Lymphs Abs: 1.7 K/uL (ref 0.7–4.0)
MCH: 30.6 pg (ref 26.0–34.0)
MCHC: 34 g/dL (ref 30.0–36.0)
MCV: 90.1 fL (ref 80.0–100.0)
Monocytes Absolute: 0.3 K/uL (ref 0.1–1.0)
Monocytes Relative: 6 %
Neutro Abs: 2.5 K/uL (ref 1.7–7.7)
Neutrophils Relative %: 56 %
Platelets: 241 K/uL (ref 150–400)
RBC: 4.44 MIL/uL (ref 4.22–5.81)
RDW: 12.9 % (ref 11.5–15.5)
WBC: 4.6 K/uL (ref 4.0–10.5)
nRBC: 0 % (ref 0.0–0.2)

## 2024-02-09 LAB — PROTIME-INR
INR: 1 (ref 0.8–1.2)
Prothrombin Time: 14 s (ref 11.4–15.2)

## 2024-02-09 LAB — TROPONIN I (HIGH SENSITIVITY): Troponin I (High Sensitivity): 3 ng/L (ref <18)

## 2024-02-09 LAB — I-STAT CG4 LACTIC ACID, ED: Lactic Acid, Venous: 1.6 mmol/L (ref 0.5–1.9)

## 2024-02-09 LAB — HEMOGLOBIN A1C
Hgb A1c MFr Bld: 4.8 % (ref 4.8–5.6)
Mean Plasma Glucose: 91.06 mg/dL

## 2024-02-09 LAB — APTT: aPTT: 29 s (ref 24–36)

## 2024-02-09 MED ORDER — SODIUM CHLORIDE 0.9 % IV SOLN: INTRAVENOUS | Status: DC

## 2024-02-09 MED ORDER — IOHEXOL 350 MG/ML SOLN
100.0000 mL | Freq: Once | INTRAVENOUS | Status: AC | PRN
  Administered 2024-02-10: 100 mL via INTRAVENOUS

## 2024-02-09 MED ORDER — ASPIRIN 81 MG PO CHEW: 324.0000 mg | CHEWABLE_TABLET | Freq: Once | ORAL | Status: DC

## 2024-02-09 NOTE — ED Provider Notes (Signed)
  Care assumed from Dr. Yolande.  Patient here with syncopal episode followed by chest pain.  EKG had ST elevations similar to previous and code STEMI was canceled by cardiology.  Awaiting CT angiogram to rule out aortic dissection. Troponin negative x 1.  Discussed with Dr. Gail of cardiology who agrees with treating as possible ACS given syncope with chest pain.  CT head and C-spine negative for acute traumatic injury.  CTA negative for aortic aneurysm, dissection or pulmonary embolism. Does show prominent appendix of 8.2 mm.  Does not have any right lower quadrant pain.  Early appendicitis not excluded.  Admission for syncope and chest pain discussed with Dr. Keturah.   Ronald Senior, MD 02/10/24 [phone]

## 2024-02-09 NOTE — ED Triage Notes (Signed)
 Pt BIB EMS due to syncope episode that was witnessed by family. Pt did hit the back of his head. Pt states that he felt good prior to the syncope episode, and denies CP prior. Pt now reporting 9/10 pain in his chest.

## 2024-02-09 NOTE — ED Provider Notes (Signed)
 Ronald [name] EMERGENCY DEPARTMENT AT [facility] Provider Note   CSN: [id number] Arrival date & time: 02/09/24  2144     Patient presents with: Chest Pain   Ronald [name] is a 43 y.o. male.   43 year old male history of anxiety, PTSD, and benzodiazepine abuse who presents emergency department chest pain.  Patient reports that he was at home going down the stairs when he started feeling lightheaded.  Says he passed out and hit his head.  Says that he woke up and then afterwards started feeling chest pain.  Describes it as a burning sensation in his chest.  9/10 in severity.  Not exertional or pleuritic.  No diaphoresis or vomiting.  EMS called code STEMI in the field but EKG was reviewed by cardiology did not feel that it was consistent with ST elevation MI.  Patient did take 4 baby aspirin prior to arrival       Prior to Admission medications   Medication Sig Start Date End Date Taking? Authorizing Provider  benztropine  (COGENTIN ) 1 MG tablet Take 1 tablet (1 mg total) by mouth 2 (two) times daily as needed (with haldol  during agitation protocol not to exceed 2 mg daily). 01/04/24   Lenard Calin, MD  divalproex  (DEPAKOTE ) 500 MG Ronald tablet Take 2 tablets (1,000 mg total) by mouth 2 (two) times daily. 01/04/24   Lenard Calin, MD  gabapentin  (NEURONTIN ) 300 MG capsule Take 1 capsule (300 mg total) by mouth every 12 (twelve) hours. 01/04/24   Lenard Calin, MD  haloperidol  (HALDOL ) 5 MG tablet Take 3 tablets (15 mg total) by mouth at bedtime. 01/04/24   Lenard Calin, MD  magnesium  oxide (MAG-OX) 400 (240 Mg) MG tablet Take 400 mg by mouth at bedtime.    [provider]  melatonin 5 MG TABS Take 1 tablet (5 mg total) by mouth at bedtime. 01/04/24   Lenard Calin, MD  Multiple Vitamins-Minerals (MULTIVITAMIN WITH MINERALS) tablet Take 1 tablet by mouth daily.    [provider]  nicotine  (NICODERM CQ  - DOSED IN MG/24 HOURS) 21 mg/24hr patch Place 1 patch  (21 mg total) onto the skin daily. 01/05/24   Lenard Calin, MD  nicotine  polacrilex (NICORETTE ) 2 MG gum Take 1 each (2 mg total) by mouth as needed for smoking cessation. 01/04/24   Lenard Calin, MD  traZODone  (DESYREL ) 50 MG tablet Take 1 tablet (50 mg total) by mouth at bedtime as needed and may repeat dose one time if needed for sleep. 01/04/24   Lenard Calin, MD    Allergies: Hydroxyzine , Alupent [metaproterenol], Benadryl  [diphenhydramine ], Klonopin  [clonazepam ], Other, Quetiapine , and Zyprexa  [olanzapine ]    Review of Systems  Updated Vital Signs BP 111/79   Pulse 66   Temp 97.8 F (36.6 C) (Oral)   Resp 16   Ht 5' 11 (1.803 m)   Wt 77.1 kg   SpO2 100%   BMI 23.71 kg/m   Physical Exam Vitals and nursing note reviewed.  Constitutional:      General: He is not in acute distress.    Appearance: He is well-developed.  HENT:     Head: Normocephalic and atraumatic.     Right Ear: External ear normal.     Left Ear: External ear normal.     Nose: Nose normal.  Eyes:     Extraocular Movements: Extraocular movements intact.     Conjunctiva/sclera: Conjunctivae normal.     Pupils: Pupils are equal, round, and reactive to light.  Cardiovascular:  Rate and Rhythm: Normal rate and regular rhythm.     Heart sounds: Normal heart sounds.     Comments: Radial pulses 2+ bilaterally Pulmonary:     Effort: Pulmonary effort is normal. No respiratory distress.     Breath sounds: Normal breath sounds.  Musculoskeletal:     Cervical back: Normal range of motion and neck supple.     Right lower leg: No edema.     Left lower leg: No edema.     Comments: DP pulses 2+ bilaterally  Skin:    General: Skin is warm and dry.  Neurological:     Mental Status: He is alert. Mental status is at baseline.  Psychiatric:        Mood and Affect: Mood normal.        Behavior: Behavior normal.     (all labs ordered are listed, but only abnormal results are displayed) Labs Reviewed   HEMOGLOBIN A1C  CBC WITH DIFFERENTIAL/PLATELET  PROTIME-INR  APTT  COMPREHENSIVE METABOLIC PANEL WITH GFR  LIPID PANEL  I-STAT CG4 LACTIC ACID, ED  TROPONIN I (HIGH SENSITIVITY)    EKG: EKG Interpretation Date/Time:  Monday February 09 2024 21:49:07 EDT Ventricular Rate:  69 PR Interval:  177 QRS Duration:  77 QT Interval:  363 QTC Calculation: 389 R Axis:   78  Text Interpretation: Sinus rhythm Consider left atrial enlargement Inferior infarct, acute (LCx) ST elevation, consider anterior injury Lateral leads are also involved >>> Acute MI <<< Confirmed by Ronald [name] [phone]) on 02/09/2024 10:56:29 PM  Radiology: Ronald [name] Chest Port 1 View Result Date: 02/09/2024 EXAM: 1 VIEW XRAY OF THE CHEST 02/09/2024 10:16:51 PM COMPARISON: None available. CLINICAL HISTORY: cp. Chest pain cp. Chest pain FINDINGS: LUNGS AND PLEURA: No focal pulmonary opacity. No pulmonary edema. No pleural effusion. No pneumothorax. HEART AND MEDIASTINUM: No acute abnormality of the cardiac and mediastinal silhouettes. BONES AND SOFT TISSUES: No acute osseous abnormality. IMPRESSION: 1. No acute process. Electronically signed by: Ronald Molt MD 02/09/2024 10:25 PM EDT RP Workstation: [id number]     Procedures   Medications Ordered in the ED  0.9 %  sodium chloride  infusion ( Intravenous Canceled Entry 02/09/24 2205)  aspirin chewable tablet 324 mg (324 mg Oral Not Given 02/09/24 2154)    Clinical Course as of 02/09/24 2257  Mon Feb 09, 2024  2153 Ronald [name] from cardiology consulted.  Does not want to activate code STEMI at this point in time since there is no significant change from prior EKGs.  Has reviewed his EKG here in the emergency department and he went transferred by EMS [RP]    Clinical Course User Index [RP] Ronald [name] BROCKS, MD                                 Medical Decision Making Amount and/or Complexity of Data Reviewed Labs: ordered. Radiology: ordered.  Risk OTC  drugs. Prescription drug management.   Ronald [name] is a 43 year old male history of anxiety, PTSD, benzodiazepine abuse presents emergency department chest pain  Initial Ddx:  MI, arrhythmia, PE, dissection, concussion, C-spine injury  MDM/Course:  Patient presents emergency department chest pain as well as syncopal event.  Did hit his head when he fell.  Is having fairly significant chest pain this point in time.  Does not appear to be in acute distress.  No significant signs of head trauma.  EKG does show some ST elevations in  the anterior lateral leads as well as the inferior leads.  I did discuss this with the on-call interventional cardiologist after code STEMI was activated in the field and they deactivated it.  This is felt to be early repolarization and similar to prior EKGs that the patient has had.  Received aspirin prior to arrival.  High-sensitivity troponins sent and are pending at this point in time.  Will also perform CT of the head with this fall and CTA of the chest abdomen pelvis to rule out any other life-threatening causes of his chest discomfort syncope such as dissection or PE.  Signed out to the oncoming physician (Ronald Ronald [name]) awaiting imaging and lab work results.   This patient presents to the ED for concern of complaints listed in HPI, this involves an extensive number of treatment options, and is a complaint that carries with it a high risk of complications and morbidity. Disposition including potential need for admission considered.   Dispo: Pending remainder of workup  Additional history obtained from mother Records reviewed Outpatient Clinic Notes The following labs were independently interpreted: CBC and show no acute abnormality I independently reviewed the following imaging with scope of interpretation limited to determining acute life threatening conditions related to emergency care: Chest x-ray and agree with the radiologist interpretation with the following  exceptions: none I personally reviewed and interpreted cardiac monitoring: normal sinus rhythm  I personally reviewed and interpreted the pt's EKG: see above for interpretation  I have reviewed the patients home medications and made adjustments as needed Consultants: Cardiology  Portions of this note were generated with Dragon dictation software. Dictation errors may occur despite best attempts at proofreading.     Final diagnoses:  Injury of head, initial encounter  Nonspecific chest pain  Syncope, unspecified syncope type    ED Discharge Orders     None          Ronald Lamar BROCKS, MD 02/09/24 2258

## 2024-02-10 ENCOUNTER — Encounter (HOSPITAL_COMMUNITY): Payer: Self-pay | Admitting: Internal Medicine

## 2024-02-10 ENCOUNTER — Observation Stay (HOSPITAL_COMMUNITY): Payer: Self-pay

## 2024-02-10 DIAGNOSIS — R55 Syncope and collapse: Secondary | ICD-10-CM

## 2024-02-10 DIAGNOSIS — R0789 Other chest pain: Secondary | ICD-10-CM | POA: Diagnosis present

## 2024-02-10 DIAGNOSIS — R079 Chest pain, unspecified: Secondary | ICD-10-CM | POA: Diagnosis not present

## 2024-02-10 DIAGNOSIS — R012 Other cardiac sounds: Secondary | ICD-10-CM

## 2024-02-10 LAB — RAPID URINE DRUG SCREEN, HOSP PERFORMED
Amphetamines: NOT DETECTED
Barbiturates: NOT DETECTED
Benzodiazepines: NOT DETECTED
Cocaine: NOT DETECTED
Opiates: NOT DETECTED
Tetrahydrocannabinol: POSITIVE — AB

## 2024-02-10 LAB — BASIC METABOLIC PANEL WITH GFR
Anion gap: 7 (ref 5–15)
BUN: 10 mg/dL (ref 6–20)
CO2: 28 mmol/L (ref 22–32)
Calcium: 8.5 mg/dL — ABNORMAL LOW (ref 8.9–10.3)
Chloride: 103 mmol/L (ref 98–111)
Creatinine, Ser: 1.02 mg/dL (ref 0.61–1.24)
GFR, Estimated: >60 mL/min (ref >60)
Glucose, Bld: 98 mg/dL (ref 70–99)
Potassium: 4.1 mmol/L (ref 3.5–5.1)
Sodium: 138 mmol/L (ref 135–145)

## 2024-02-10 LAB — PHOSPHORUS: Phosphorus: 4.8 mg/dL — ABNORMAL HIGH (ref 2.5–4.6)

## 2024-02-10 LAB — ECHOCARDIOGRAM COMPLETE
Area-P 1/2: 4.39 cm2
Calc EF: 49.6 %
Height: 71 in
S' Lateral: 2.6 cm
Single Plane A2C EF: 77.3 %
Single Plane A4C EF: -2 %
Weight: 2720 [oz_av]

## 2024-02-10 LAB — SEDIMENTATION RATE: Sed Rate: 1 mm/h (ref 0–16)

## 2024-02-10 LAB — HIV ANTIBODY (ROUTINE TESTING W REFLEX): HIV Screen 4th Generation wRfx: NONREACTIVE

## 2024-02-10 LAB — CBC
HCT: 41.4 % (ref 39.0–52.0)
Hemoglobin: 14.5 g/dL (ref 13.0–17.0)
MCH: 31.5 pg (ref 26.0–34.0)
MCHC: 35 g/dL (ref 30.0–36.0)
MCV: 90 fL (ref 80.0–100.0)
Platelets: 286 K/uL (ref 150–400)
RBC: 4.6 MIL/uL (ref 4.22–5.81)
RDW: 13 % (ref 11.5–15.5)
WBC: 7.1 K/uL (ref 4.0–10.5)
nRBC: 0 % (ref 0.0–0.2)

## 2024-02-10 LAB — TROPONIN I (HIGH SENSITIVITY): Troponin I (High Sensitivity): 3 ng/L (ref <18)

## 2024-02-10 LAB — C-REACTIVE PROTEIN: CRP: 0.8 mg/dL (ref <1.0)

## 2024-02-10 LAB — TSH: TSH: 0.83 u[IU]/mL (ref 0.350–4.500)

## 2024-02-10 LAB — MAGNESIUM: Magnesium: 1.9 mg/dL (ref 1.7–2.4)

## 2024-02-10 LAB — LIPASE, BLOOD: Lipase: 30 U/L (ref 11–51)

## 2024-02-10 MED ORDER — LIDOCAINE 5 % EX PTCH: 2.0000 | MEDICATED_PATCH | Freq: Every day | CUTANEOUS | Status: DC | PRN

## 2024-02-10 MED ORDER — DIVALPROEX SODIUM 500 MG PO DR TAB
1000.0000 mg | DELAYED_RELEASE_TABLET | Freq: Two times a day (BID) | ORAL | Status: DC
  Administered 2024-02-10: 1000 mg via ORAL
  Filled 2024-02-10: qty 4

## 2024-02-10 MED ORDER — ENOXAPARIN SODIUM 40 MG/0.4ML IJ SOSY
40.0000 mg | PREFILLED_SYRINGE | INTRAMUSCULAR | Status: DC
  Filled 2024-02-10: qty 0.4

## 2024-02-10 MED ORDER — ALBUTEROL SULFATE (2.5 MG/3ML) 0.083% IN NEBU: 2.5000 mg | INHALATION_SOLUTION | RESPIRATORY_TRACT | Status: DC | PRN

## 2024-02-10 MED ORDER — NICOTINE POLACRILEX 2 MG MT GUM: 2.0000 mg | CHEWING_GUM | OROMUCOSAL | Status: DC | PRN

## 2024-02-10 MED ORDER — TRAZODONE HCL 50 MG PO TABS: 50.0000 mg | ORAL_TABLET | Freq: Every evening | ORAL | Status: DC | PRN

## 2024-02-10 MED ORDER — ALUM & MAG HYDROXIDE-SIMETH 200-200-20 MG/5ML PO SUSP: 30.0000 mL | Freq: Four times a day (QID) | ORAL | Status: DC | PRN

## 2024-02-10 MED ORDER — ACETAMINOPHEN 500 MG PO TABS: 1000.0000 mg | ORAL_TABLET | Freq: Four times a day (QID) | ORAL | Status: DC | PRN

## 2024-02-10 MED ORDER — BENZTROPINE MESYLATE 1 MG PO TABS: 1.0000 mg | ORAL_TABLET | Freq: Two times a day (BID) | ORAL | Status: DC | PRN

## 2024-02-10 MED ORDER — LACTATED RINGERS IV BOLUS
1000.0000 mL | Freq: Once | INTRAVENOUS | Status: AC
  Administered 2024-02-10: 1000 mL via INTRAVENOUS

## 2024-02-10 MED ORDER — HALOPERIDOL 5 MG PO TABS: 15.0000 mg | ORAL_TABLET | Freq: Every day | ORAL | Status: DC

## 2024-02-10 MED ORDER — NICOTINE 21 MG/24HR TD PT24: 21.0000 mg | MEDICATED_PATCH | Freq: Every day | TRANSDERMAL | Status: DC | PRN

## 2024-02-10 MED ORDER — POLYETHYLENE GLYCOL 3350 17 G PO PACK: 17.0000 g | PACK | Freq: Every day | ORAL | Status: DC | PRN

## 2024-02-10 MED ORDER — MELATONIN 3 MG PO TABS: 6.0000 mg | ORAL_TABLET | Freq: Every evening | ORAL | Status: DC | PRN

## 2024-02-10 MED ORDER — FAMOTIDINE 20 MG PO TABS: 20.0000 mg | ORAL_TABLET | Freq: Two times a day (BID) | ORAL | Status: DC | PRN

## 2024-02-10 MED ORDER — METHOCARBAMOL 500 MG PO TABS: 500.0000 mg | ORAL_TABLET | Freq: Three times a day (TID) | ORAL | Status: DC | PRN

## 2024-02-10 MED ORDER — ONDANSETRON HCL 4 MG/2ML IJ SOLN: 4.0000 mg | Freq: Four times a day (QID) | INTRAMUSCULAR | Status: DC | PRN

## 2024-02-10 MED ORDER — GABAPENTIN 300 MG PO CAPS
300.0000 mg | ORAL_CAPSULE | Freq: Two times a day (BID) | ORAL | Status: DC
  Administered 2024-02-10: 300 mg via ORAL
  Filled 2024-02-10: qty 1

## 2024-02-10 MED ORDER — SODIUM CHLORIDE 0.9% FLUSH
3.0000 mL | Freq: Two times a day (BID) | INTRAVENOUS | Status: DC
  Administered 2024-02-10: 3 mL via INTRAVENOUS

## 2024-02-10 NOTE — Progress Notes (Signed)
  Echocardiogram 2D Echocardiogram has been performed.  Norleen ORN [facility] 02/10/2024, 9:56 AM

## 2024-02-10 NOTE — ED Notes (Signed)
 Breathing is even and unlabored. PT resting on left side with eyes closed at this time. Visible chest falling and rising.

## 2024-02-10 NOTE — Progress Notes (Signed)
 Duplicate/error

## 2024-02-10 NOTE — ED Notes (Signed)
 CCMD called to confirm pt is on cardiac monitor.

## 2024-02-10 NOTE — ED Notes (Signed)
Pt ambulated to bathroom and back. Tolerated well.

## 2024-02-10 NOTE — H&P (Signed)
 History and Physical    Ronald [name] FMW:[id number] DOB: [birth year] DOA: 02/09/2024  PCP: Joshua Debby CROME, MD   Patient coming from: Home   Chief Complaint:  Chief Complaint  Patient presents with   Chest Pain    HPI:  Ronald [name] is a 43 y.o. male with hx of schizoaffective disorder, bipolar type, GAD, PTSD, limited medical hx, who presents with chest pain and nearsyncope with fall. He was initially a code STEMI by EMS but cancelled by cardiology as EKG changes similar to prior. Reports that yesterday evening was going downstairs to get his dinner and then walking into the living room when he suddenly felt lightheaded and went down to the ground but was able to brace himself on his hands, and roll to the side. Did not completely lose consciousness per his report. Right after this episode he had central chest pain described as a burning pain which lasted about 1.5 hours and has not recurred. No associated SOB, N/V/ diaphoresis. He has never had any similar episodes of chest pain in the past. He typically has good exercise tolerance and runs about 2 miles without any exertional symptoms, although stopped about 2 months ago. No exertional symptoms in the meantime. He has had no other recent illness, fever/chills, cough / cold, SOB, Abd pain, N/V/D, blood in stool, rashes.   Review of Systems:  ROS complete and negative except as marked above   Allergies  Allergen Reactions   Hydroxyzine  Itching, Anxiety and Other (See Comments)    Hyper, too   Alupent [Metaproterenol] Other (See Comments)    Hyperactivity   Benadryl  [Diphenhydramine ] Other (See Comments)    Pt states that this makes him feel insane   Klonopin  [Clonazepam ] Other (See Comments)    Patient feels Ativan  is a better fit for him than Klonopin - effect is not as strong.   Other Other (See Comments)    Pt reports that all antihistamines make him feel insane   Quetiapine      Hallucinations    Zyprexa   [Olanzapine ] Other (See Comments)    This made me feel weird.    Prior to Admission medications   Medication Sig Start Date End Date Taking? Authorizing Provider  benztropine  (COGENTIN ) 1 MG tablet Take 1 tablet (1 mg total) by mouth 2 (two) times daily as needed (with haldol  during agitation protocol not to exceed 2 mg daily). 01/04/24   Lenard Calin, MD  divalproex  (DEPAKOTE ) 500 MG DR tablet Take 2 tablets (1,000 mg total) by mouth 2 (two) times daily. 01/04/24   Lenard Calin, MD  gabapentin  (NEURONTIN ) 300 MG capsule Take 1 capsule (300 mg total) by mouth every 12 (twelve) hours. 01/04/24   Lenard Calin, MD  haloperidol  (HALDOL ) 5 MG tablet Take 3 tablets (15 mg total) by mouth at bedtime. 01/04/24   Lenard Calin, MD  magnesium  oxide (MAG-OX) 400 (240 Mg) MG tablet Take 400 mg by mouth at bedtime.    [provider]  melatonin 5 MG TABS Take 1 tablet (5 mg total) by mouth at bedtime. 01/04/24   Lenard Calin, MD  Multiple Vitamins-Minerals (MULTIVITAMIN WITH MINERALS) tablet Take 1 tablet by mouth daily.    [provider]  nicotine  (NICODERM CQ  - DOSED IN MG/24 HOURS) 21 mg/24hr patch Place 1 patch (21 mg total) onto the skin daily. 01/05/24   Lenard Calin, MD  nicotine  polacrilex (NICORETTE ) 2 MG gum Take 1 each (2 mg total) by mouth as needed for smoking cessation. 01/04/24  Lenard Calin, MD  traZODone  (DESYREL ) 50 MG tablet Take 1 tablet (50 mg total) by mouth at bedtime as needed and may repeat dose one time if needed for sleep. 01/04/24   Lenard Calin, MD    Past Medical History:  Diagnosis Date   Anxiety    Arthritis    Disorder of pineal gland    Post traumatic stress disorder    Schizoaffective disorder, bipolar type (HCC) 08/29/2023   Severe manic bipolar 1 disorder with psychotic behavior (HCC) 08/29/2023    Past Surgical History:  Procedure Laterality Date   ANKLE SURGERY       reports that he has never smoked. His smokeless tobacco  use includes chew. He reports current drug use. Drug: Marijuana. He reports that he does not drink alcohol.  No family history on file.   Physical Exam: Vitals:   02/09/24 2152 02/09/24 2153  BP:  111/79  Pulse: 66   Resp: 16   Temp: 97.8 F (36.6 C)   TempSrc: Oral   SpO2:  100%  Weight: 77.1 kg   Height: 5' 11 (1.803 m)     Gen: Awake, alert, NAD   CV: Regular, normal S1, S2, no murmurs  Resp: Normal WOB, CTAB  Abd: Flat, normoactive, nontender MSK: Symmetric, no edema  Skin: No rashes or lesions to exposed skin  Neuro: Alert and interactive  Psych: slightly anxious but appropriate    Data review:   Labs reviewed, notable for:   Chemistries unremarkable  HS trop 3 Lactate 1.6  WBC 4    Micro:  Results for orders placed or performed during the hospital encounter of 06/26/23  SARS Coronavirus 2 by RT PCR (hospital order, performed in [facility] hospital lab) *cepheid single result test* Anterior Nasal Swab     Status: None   Collection Time: 06/27/23  5:58 PM   Specimen: Anterior Nasal Swab  Result Value Ref Range Status   SARS Coronavirus 2 by RT PCR NEGATIVE NEGATIVE Final    Comment: (NOTE) SARS-CoV-2 target nucleic acids are NOT DETECTED.  The SARS-CoV-2 RNA is generally detectable in upper and lower respiratory specimens during the acute phase of infection. The lowest concentration of SARS-CoV-2 viral copies this assay can detect is 250 copies / mL. A negative result does not preclude SARS-CoV-2 infection and should not be used as the sole basis for treatment or other patient management decisions.  A negative result may occur with improper specimen collection / handling, submission of specimen other than nasopharyngeal swab, presence of viral mutation(s) within the areas targeted by this assay, and inadequate number of viral copies (<250 copies / mL). A negative result must be combined with clinical observations, patient history, and epidemiological  information.  Fact Sheet for Patients:   roadlaptop.co.za  Fact Sheet for Healthcare Providers: http://kim-miller.com/  This test is not yet approved or  cleared by the [place]  FDA and has been authorized for detection and/or diagnosis of SARS-CoV-2 by FDA under an Emergency Use Authorization (EUA).  This EUA will remain in effect (meaning this test can be used) for the duration of the COVID-19 declaration under Section 564(b)(1) of the Act, 21 U.S.C. section 360bbb-3(b)(1), unless the authorization is terminated or revoked sooner.  Performed at [facility], [address], [place], [name] [address]     Imaging reviewed:  CT ANGIO CHEST/ABD/PEL FOR DISSECTION W &/OR WO CONTRAST Result Date: 02/10/2024 EXAM: CTA CHEST, ABDOMEN AND PELVIS WITH AND WITHOUT CONTRAST 02/09/2024 11:59:46 PM  TECHNIQUE: CTA of the chest was performed with and without the administration of intravenous contrast. CTA of the abdomen and pelvis was performed with and without the administration of intravenous contrast. 100 mL of iohexol (OMNIPAQUE) 350 MG/ML injection was administered. Multiplanar reformatted images are provided for review. MIP images are provided for review. Automated exposure control, iterative reconstruction, and/or weight based adjustment of the mA/kV was utilized to reduce the radiation dose to as low as reasonably achievable. COMPARISON: CT abdomen and pelvis without contrast 08/08/2013. CLINICAL HISTORY: Acute aortic syndrome (AAS) suspected. Chest pain. FINDINGS: VASCULATURE: AORTA: No acute finding. No abdominal aortic aneurysm. No dissection. PULMONARY ARTERIES: No pulmonary embolism with the limits of this exam. GREAT VESSELS OF AORTIC ARCH: No acute finding. No dissection. No arterial occlusion or significant stenosis. CELIAC TRUNK: No acute finding. No occlusion or significant stenosis. SUPERIOR MESENTERIC ARTERY: No acute  finding. No occlusion or significant stenosis. INFERIOR MESENTERIC ARTERY: No acute finding. No occlusion or significant stenosis. RENAL ARTERIES: No acute finding. No occlusion or significant stenosis. ILIAC ARTERIES: No acute finding. No occlusion or significant stenosis. There is fusiform ectasia in the distal left common iliac artery which measures 1.5 cm, compared with the remainder which measures 8 mm. CHEST: MEDIASTINUM: No mediastinal lymphadenopathy. The heart and pericardium demonstrate no acute abnormality. LUNGS AND PLEURA: There is diffuse bronchial thickening. There is subsegmental posterior atelectasis in the lung bases. There is no consolidation, effusion, or pneumothorax. Solitary 4 mm nodule noted in the right lower lobe superior segment series 8 image 88. Remaining lungs are clear. THORACIC BONES AND SOFT TISSUES: No acute bone or soft tissue abnormality. ABDOMEN AND PELVIS: LIVER: The liver is unremarkable. GALLBLADDER AND BILE DUCTS: Gallbladder is unremarkable. No biliary ductal dilatation. SPLEEN: The spleen is unremarkable. PANCREAS: The pancreas is unremarkable. ADRENAL GLANDS: Bilateral adrenal glands demonstrate no acute abnormality. There is no adrenal mass. 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 No renal mass enhancement. No urinary stone or obstruction. The bladder and ureters are unremarkable. No perinephric or periureteral stranding. GI AND BOWEL: Stomach and duodenal sweep demonstrate no acute abnormality. There is no bowel obstruction or inflammation. There is uncomplicated sigmoid diverticulosis. The appendix measures prominent compared to the prior study, now measuring 8.2 mm (previously 4 mm). Although there are no overt adjacent inflammatory changes, clinical correlation is recommended for early appendicitis. REPRODUCTIVE: Reproductive organs are unremarkable. PERITONEUM AND RETROPERITONEUM: No ascites or free air. There is no free  hemorrhage. There is no incarcerated hernia. LYMPH NODES: No lymphadenopathy. ABDOMINAL BONES AND SOFT TISSUES: There is transitional anatomy at the lumbosacral junction. Early bilateral hip arthrosis. No acute soft tissue abnormality. IMPRESSION: 1. No evidence of acute aortic syndrome. Normal CTA of the aorta and major branch arteries 2. Prominent appendix measuring 8.2 mm, previously 4 mm, without overt inflammatory changes. Findings could represent early appendicitis; recommend clinical evaluation. 3. Solitary 4 mm solid right lower lobe pulmonary nodule. No routine follow-up imaging is recommended as per Fleischner Society Guidelines. 4. 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 Remainder measures 8 mm. . Electronically signed by: Francis Quam MD 02/10/2024 12:31 AM EDT RP Workstation: [id number]   CT Head Wo Contrast Result Date: 02/10/2024 EXAM: CT HEAD AND CERVICAL SPINE 02/09/2024 11:59:46 PM TECHNIQUE: CT of the head and cervical spine was performed without the administration of intravenous contrast. Multiplanar reformatted images are provided for review. Automated exposure control, iterative reconstruction, and/or weight based adjustment of  the mA/kV was utilized to reduce the radiation dose to as low as reasonably achievable. COMPARISON: None available. CLINICAL HISTORY: Head trauma, abnormal mental status (Age 66-64y). Head/neck trauma, impaired ROM, AMS. FINDINGS: CT HEAD BRAIN AND VENTRICLES: No acute intracranial hemorrhage. No mass effect or midline shift. No abnormal extra-axial fluid collection. No evidence of acute infarct. No hydrocephalus. ORBITS: No acute abnormality. SINUSES AND MASTOIDS: No acute abnormality. SOFT TISSUES AND SKULL: No acute skull fracture. No acute soft tissue abnormality. CT CERVICAL SPINE BONES AND ALIGNMENT: No acute fracture or traumatic malalignment. No listhesis. DEGENERATIVE CHANGES: Overall straightening of the cervical  spine. SOFT TISSUES: No prevertebral soft tissue swelling. IMPRESSION: 1. No acute intracranial abnormality. 2. No acute fracture or traumatic malalignment of the cervical spine. 3. Straightening of the cervical spine, which may reflect muscle spasm or positioning. Electronically signed by: Dorethia Molt MD 02/10/2024 12:10 AM EDT RP Workstation: [id number]   CT Cervical Spine Wo Contrast Result Date: 02/10/2024 EXAM: CT HEAD AND CERVICAL SPINE 02/09/2024 11:59:46 PM TECHNIQUE: CT of the head and cervical spine was performed without the administration of intravenous contrast. Multiplanar reformatted images are provided for review. Automated exposure control, iterative reconstruction, and/or weight based adjustment of the mA/kV was utilized to reduce the radiation dose to as low as reasonably achievable. COMPARISON: None available. CLINICAL HISTORY: Head trauma, abnormal mental status (Age 1-64y). Head/neck trauma, impaired ROM, AMS. FINDINGS: CT HEAD BRAIN AND VENTRICLES: No acute intracranial hemorrhage. No mass effect or midline shift. No abnormal extra-axial fluid collection. No evidence of acute infarct. No hydrocephalus. ORBITS: No acute abnormality. SINUSES AND MASTOIDS: No acute abnormality. SOFT TISSUES AND SKULL: No acute skull fracture. No acute soft tissue abnormality. CT CERVICAL SPINE BONES AND ALIGNMENT: No acute fracture or traumatic malalignment. No listhesis. DEGENERATIVE CHANGES: Overall straightening of the cervical spine. SOFT TISSUES: No prevertebral soft tissue swelling. IMPRESSION: 1. No acute intracranial abnormality. 2. No acute fracture or traumatic malalignment of the cervical spine. 3. Straightening of the cervical spine, which may reflect muscle spasm or positioning. Electronically signed by: Dorethia Molt MD 02/10/2024 12:10 AM EDT RP Workstation: [id number]   DG Chest Port 1 View Result Date: 02/09/2024 EXAM: 1 VIEW XRAY OF THE CHEST 02/09/2024 10:16:51 PM COMPARISON: None  available. CLINICAL HISTORY: cp. Chest pain cp. Chest pain FINDINGS: LUNGS AND PLEURA: No focal pulmonary opacity. No pulmonary edema. No pleural effusion. No pneumothorax. HEART AND MEDIASTINUM: No acute abnormality of the cardiac and mediastinal silhouettes. BONES AND SOFT TISSUES: No acute osseous abnormality. IMPRESSION: 1. No acute process. Electronically signed by: Dorethia Molt MD 02/09/2024 10:25 PM EDT RP Workstation: [id number]    EKG:  Personally reviewed, SR, suspect repolarization change with J point notching and upsloping ST changes which are present on prior EKG.    ED Course:  Code STEMI by EMS, cancelled by cardiology, EDP spoke with cardiology felt EKG change related to early repolarization and similar to prior EKG. Treated with Aspirin prior to arrival.    Assessment/Plan:  43 y.o. male with hx schizoaffective disorder, bipolar type, GAD, PTSD, limited medical hx, who presents with chest pain and nearsyncope with fall. He was initially a code STEMI by EMS but cancelled by cardiology as EKG changes similar to prior.   Nearsyncope with fall  Chest pain  Isolated episode of lightheadedness with transition to standing and c/b nearsyncopal fall. 1.5 hr central chest burning thereafter which has resolved. See course, code STEMI in field, cancelled as EKG changes chronic, suspected early repolarization. VS  reassuring. HS trop 3. And labs overall unremarkable. EKG with likely repolarization changes. CTA for dissection negative. Etiology of his syncope sounds orthostatic, possibly vagal. Chest pain is unclear, atypical and prolonged with a negative trop making ischemic pain unlikely, CTA reassuring, no dissection, although not timed for PE no PE visualized. ST changes do not seem c/w pericarditis + clinically does not seem consistent. ? GI with burning quality.   -- Trend trop, check ESR, CRP  -- Echo eval for pericardial effusion -- Give 1 L IVF, check orthostatics in AM  -- Tele  monitoring  -- Symptom mgmt: Can trial NTG if recurrent, otherwise prns Tylenol , methocarbamol, heat, Famotidine, GI cocktail   Chronic medical problems:  Schizoaffective disorder, bipolar type; Hx GAD; Hx PTSD: Continue home Haldol , Depakote , Gabapentin  BID, benztropine  prn, Trazodone  at bedtime   Body mass index is 23.71 kg/m.    DVT prophylaxis:  Lovenox Code Status:  Full Code Diet:  Diet Orders (From admission, onward)     Start     Ordered   02/10/24 0040  Diet Heart Room service appropriate? Yes; Fluid consistency: Thin  Diet effective now       Question Answer Comment  Room service appropriate? Yes   Fluid consistency: Thin      02/10/24 0040           Family Communication:  None   Consults:  None   Admission status:   Observation, Telemetry bed  Severity of Illness: The appropriate patient status for this patient is OBSERVATION. Observation status is judged to be reasonable and necessary in order to provide the required intensity of service to ensure the patient's safety. The patient's presenting symptoms, physical exam findings, and initial radiographic and laboratory data in the context of their medical condition is felt to place them at decreased risk for further clinical deterioration. Furthermore, it is anticipated that the patient will be medically stable for discharge from the hospital within 2 midnights of admission.    Dorn Dawson, MD [facility]  How to contact the [facility] Attending or Consulting provider 7A - 7P or covering provider during after hours 7P -7A, for this patient.  Check the care team in [facility] and look for a) attending/consulting [facility] provider listed and b) the [facility] team listed Log into [email or web address] and use [place]'s universal password to access. If you do not have the password, please contact the hospital operator. Locate the [facility] provider you are looking for under [facility] and page to a number that you can be directly  reached. If you still have difficulty reaching the provider, please page the [facility] (Director on Call) for the Hospitalists listed on amion for assistance.  02/10/2024, 12:41 AM

## 2024-02-10 NOTE — ED Notes (Signed)
 Pt provided sandwich bag and snack per MD

## 2024-02-10 NOTE — Progress Notes (Signed)
  Progress Note  Patient Name: Ronald [name] Date of Encounter: 02/10/2024 [facility] Cardiologist: None   Interval Summary   He believes his dizzy spell occurred because he took his trazodone , melatonin and magnesium  earlier than he usually does, on an empty stomach. He has not had any chest pain this morning.  Vital Signs Vitals:   02/10/24 0734 02/10/24 1100 02/10/24 1135 02/10/24 1137  BP: 119/67  137/84   Pulse: 61 (!) 44 (!) 53   Resp: 19 14 20    Temp: 98.1 F (36.7 C)   97.9 F (36.6 C)  TempSrc: Oral   Oral  SpO2: 100% 100% 100%   Weight:      Height:        Intake/Output Summary (Last 24 hours) at 02/10/2024 1343 Last data filed at 02/10/2024 0300 Gross per 24 hour  Intake 1000 ml  Output --  Net 1000 ml      02/09/2024    9:52 PM 12/27/2023    3:45 PM 10/20/2023    6:43 PM  Last 3 Weights  Weight (lbs) 170 lb    Weight (kg) 77.111 kg       Information is confidential and restricted. Go to Review Flowsheets to unlock data.      Telemetry/ECG  Normal sinus rhythm- Personally Reviewed  ECG shows normal sinus rhythm with early repolarization pattern, which is seen on dozens of electrocardiograms going back leads to 2020.  His echocardiogram is completely normal.  Reviewed his CT angiogram, which shows a slight dilation of the left common iliac artery, fusiform ectasia measuring a maximum diameter of 1.5 cm, in comparison to the contralateral vessel and the upstream and downstream portions of the otherwise normal common iliac artery which measure roughly 8 mm in diameter.  There is no evidence of atherosclerotic plaque in any of the arterial vessels seen from the level of the carotids down to the level of the femorals.  Physical Exam  GEN: No acute distress.  Appears very lean and fit even athletic. Neck: No JVD Cardiac: RRR, no murmurs, rubs, or gallops.  Respiratory: Clear to auscultation bilaterally. GI: Soft, nontender, non-distended  MS:  No edema  Assessment & Plan   It sounds like his presenting event was due to sedation and medication side effects rather than true hypotension or arrhythmia. His ECG pattern of early repolarization is a normal variant and has been present on all his tracings for several years. Labs do not support a diagnosis of myocardial infarction or pericarditis. Imaging studies show no evidence of vascular disease, other than mild fusiform dilation of the left common iliac artery that has no clinical relevance at this time. No need for additional testing at this time or for routine cardiology follow-up.  Recommend reimaging the left common iliac artery with ultrasound or CT angiography in 3-5 years.  Image sooner if he has symptoms both of this`. I gave Ronald [name] a copy of his twelve-lead electrocardiogram showing the early repolarization pattern, as this may be useful as a baseline for future events  For questions or updates, please contact [facility] Please consult [email or web address] for contact info under         Signed, Jerel Balding, MD

## 2024-02-10 NOTE — Discharge Summary (Addendum)
 Physician Discharge Summary   Ronald [name] FMW:[id number] DOB: 02-26-1981 DOA: 02/09/2024  PCP: Joshua Debby CROME, MD  Admit date: 02/09/2024 Discharge date: 02/10/2024  Admitted From: Home Disposition:  Home Discharging physician: Alm Apo, MD Barriers to discharge: none  Recommendations at discharge: Continue outpatient chronic medical and psychiatric management    Discharge Condition: stable CODE STATUS: Full  Diet recommendation:  Diet Orders (From admission, onward)     Start     Ordered   02/10/24 0040  Diet Heart Room service appropriate? Yes; Fluid consistency: Thin  Diet effective now       Question Answer Comment  Room service appropriate? Yes   Fluid consistency: Thin      02/10/24 0040   02/10/24 0000  Diet general        02/10/24 1207            Hospital Course: Ronald [name] is a 43 yo male with PMH schizoaffective disorder, bipolar manic type, GAD, PTSD.  Recently seen by psych on 9/13 under IVC from mom due to concern for lack of self care and ongoing impaired judgement/insight. Was having bizarre delusions (God and aliens communicating, creating new DNA).   Presents this hospitalization with CP after a nearsyncope fall. Initially STEMI but cancelled after review by cardiology. Has chronic EKG changes notably ST elevations.  History is that he was going downstairs for dinner and walking into living room upon becoming dizzy/lightheaded and fell to the ground bracing with hands upon landing. No full LOC per patient.   Right after this episode he had central chest pain described as a burning pain which lasted about 1.5 hours and has not recurred. No associated SOB, N/V/ diaphoresis. He has never had any similar episodes of chest pain in the past. He typically has good exercise tolerance and runs about 2 miles without any exertional symptoms, although stopped about 2 months ago. No exertional symptoms in the meantime. He has had no other recent illness,  fever/chills, cough / cold, SOB, Abd pain, N/V/D, blood in stool, rashes.    Assessment/Plan:   Nearsyncope with fall  Chest pain  - initially code STEMI but cancelled after review by cardiology - trops negative x 2 - CTA chest/abd/pelvis negative for acute aortic syndrome  - CTH and CT C-spine negative for acute abnormalities  - etiology considered noncardiac and felt to be related to hypovolemia and CP related to fall incured with MSK component -Feels well and back to normal when seen in the ER morning following admission - no further workup recommended per cardiology - Echo performed during hospitalization: EF 50 to 55%, no RWMA, normal diastolic parameters.  Normal RV function and size.  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 On day of discharge, patient was felt deemed stable for discharge. Patient/family member advised to call PCP or come back to ER if needed.   Principal Diagnosis: Non-cardiac chest pain  Discharge Diagnoses: Active Hospital Problems  Diagnosis Date Noted   Non-cardiac chest pain 02/10/2024   Bipolar 1 disorder with moderate mania (HCC) 06/03/2021    Resolved Hospital Problems  No resolved problems to display.     Discharge Instructions     Diet general   Complete by: As directed    Increase activity slowly   Complete by: As directed       Allergies as of  02/10/2024       Reactions   Vistaril  [hydroxyzine ] Itching, Anxiety, Other (See Comments)   Hyperactivity    Alupent [metaproterenol] Other (See Comments)   Hyperactivity   Klonopin  [clonazepam ] Other (See Comments)   Patient feels Klonopin  is not effective for him, he feels Ativan  works much better for him.   Seroquel  [quetiapine ] Other (See Comments)   Hallucinations   Zyprexa  [olanzapine ] Other (See Comments)   made patient feel weird   Antihistamines, Chlorpheniramine-type Anxiety, Other (See Comments)   made patient feel insane - all antihistamines   Antihistamines, Diphenhydramine -type Anxiety, Other (See Comments)   made patient feel insane - all antihistamines   Antihistamines, Loratadine-type Anxiety, Other (See Comments)   made patient feel insane - all antihistamines        Medication List     TAKE these medications    divalproex  500 MG DR tablet Commonly known as: DEPAKOTE  Take 2 tablets (1,000 mg total) by mouth 2 (two) times daily.   gabapentin  100 MG capsule Commonly known as: NEURONTIN  Take 200 mg by mouth 3 (three) times daily. What changed: Another medication with the same name was removed. Continue taking this medication, and follow the directions you see here.   haloperidol  10 MG tablet Commonly known as: HALDOL  Take 10 mg by mouth at bedtime as needed (severe anxiety).   ibuprofen  200 MG tablet Commonly known as: ADVIL  Take 400 mg by mouth 2 (two) times daily as needed for headache (pain).   Mens Multivitamin Tabs Take 1 tablet by mouth daily.   traZODone  50 MG tablet Commonly known as: DESYREL  Take 1 tablet (50 mg total) by mouth at bedtime as needed and may repeat dose one time if needed for sleep. What changed: when to take this        Allergies  Allergen Reactions   Vistaril  [Hydroxyzine ] Itching, Anxiety and Other (See Comments)    Hyperactivity    Alupent [Metaproterenol] Other (See Comments)    Hyperactivity    Klonopin  [Clonazepam ] Other (See Comments)    Patient feels Klonopin  is not effective for him, he feels Ativan  works much better for him.   Seroquel  [Quetiapine ] Other (See Comments)    Hallucinations    Zyprexa  [Olanzapine ] Other (See Comments)    made patient feel weird   Antihistamines, Chlorpheniramine-Type Anxiety and Other (See Comments)    made patient feel insane - all antihistamines   Antihistamines, Diphenhydramine -Type Anxiety and Other (See Comments)    made patient feel insane - all antihistamines   Antihistamines, Loratadine-Type Anxiety and Other (See Comments)    made patient feel insane - all antihistamines    Consultations: Cardiology  Procedures:   Discharge Exam: BP 137/84   Pulse (!) 53   Temp 97.9 F (36.6 C) (Oral)   Resp 20   Ht 5' 11 (1.803 m)   Wt 77.1 kg   SpO2 100%   BMI 23.71 kg/m  Physical Exam Constitutional:      General: He is not in acute distress.    Appearance: Normal appearance.  HENT:  Head: Normocephalic and atraumatic.     Mouth/Throat:     Mouth: Mucous membranes are moist.  Eyes:     Extraocular Movements: Extraocular movements intact.  Cardiovascular:     Rate and Rhythm: Normal rate and regular rhythm.  Pulmonary:     Effort: Pulmonary effort is normal. No respiratory distress.     Breath sounds: Normal breath sounds. No wheezing.  Abdominal:     General: Bowel sounds are normal. There is no distension.     Palpations: Abdomen is soft.     Tenderness: There is no abdominal tenderness.  Musculoskeletal:        General: Normal range of motion.     Cervical back: Normal range of motion and neck supple.  Skin:    General: Skin is warm and dry.  Neurological:     General: No focal deficit present.     Mental Status: He is alert.  Psychiatric:        Mood and Affect: Mood normal.        Behavior: Behavior normal.      The results of significant diagnostics from this hospitalization (including imaging,  microbiology, ancillary and laboratory) are listed below for reference.   Microbiology: No results found for this or any previous visit (from the past 240 hours).   Labs: BNP (last 3 results) No results for input(s): BNP in the last 8760 hours. Basic Metabolic Panel: Recent Labs  Lab 02/09/24 2159 02/10/24 0445  NA 134* 138  K 3.7 4.1  CL 99 103  CO2 26 28  GLUCOSE 118* 98  BUN 7 10  CREATININE 1.12 1.02  CALCIUM 8.3* 8.5*  MG  --  1.9  PHOS  --  4.8*   Liver Function Tests: Recent Labs  Lab 02/09/24 2159  AST 11*  ALT 10  ALKPHOS 34*  BILITOT 0.2  PROT 6.0*  ALBUMIN 3.3*   Recent Labs  Lab 02/10/24 0445  LIPASE 30   No results for input(s): AMMONIA in the last 168 hours. CBC: Recent Labs  Lab 02/09/24 2159 02/10/24 0445  WBC 4.6 7.1  NEUTROABS 2.5  --   HGB 13.6 14.5  HCT 40.0 41.4  MCV 90.1 90.0  PLT 241 286   Cardiac Enzymes: No results for input(s): CKTOTAL, CKMB, CKMBINDEX, TROPONINI in the last 168 hours. BNP: Invalid input(s): POCBNP CBG: No results for input(s): GLUCAP in the last 168 hours. D-Dimer No results for input(s): DDIMER in the last 72 hours. Hgb A1c Recent Labs    02/09/24 2159  HGBA1C 4.8   Lipid Profile Recent Labs    02/09/24 2159  CHOL 96  HDL 43  LDLCALC 42  TRIG 57  CHOLHDL 2.2   Thyroid  function studies Recent Labs    02/10/24 0445  TSH 0.830   Anemia work up No results for input(s): VITAMINB12, FOLATE, FERRITIN, TIBC, IRON, RETICCTPCT in the last 72 hours. Urinalysis    Component Value Date/Time   COLORURINE YELLOW 06/26/2023 1348   APPEARANCEUR HAZY (A) 06/26/2023 1348   LABSPEC 1.019 06/26/2023 1348   PHURINE 6.0 06/26/2023 1348   GLUCOSEU NEGATIVE 06/26/2023 1348   HGBUR NEGATIVE 06/26/2023 1348   BILIRUBINUR NEGATIVE 06/26/2023 1348   KETONESUR NEGATIVE 06/26/2023 1348   PROTEINUR NEGATIVE 06/26/2023 1348   UROBILINOGEN 0.2 08/08/2013 1342   NITRITE NEGATIVE  06/26/2023 1348   LEUKOCYTESUR NEGATIVE 06/26/2023 1348   Sepsis Labs Recent Labs  Lab 02/09/24 2159 02/10/24 0445  WBC 4.6 7.1   Microbiology  No results found for this or any previous visit (from the past 240 hours).  Procedures/Studies: ECHOCARDIOGRAM COMPLETE Result Date: 02/10/2024    ECHOCARDIOGRAM REPORT   Patient Name:   Ronald [name] Date of Exam: 02/10/2024 Medical Rec #:  [id number]       Height:       71.0 in Accession #:    [id number]      Weight:       170.0 lb Date of Birth:  03-Nov-1980      BSA:          1.968 m Patient Age:    42 years        BP:           134/70 mmHg Patient Gender: M               HR:           66 bpm. Exam Location:  Inpatient Procedure: 2D Echo (Both Spectral and Color Flow Doppler were utilized during            procedure). Indications:    Other Cardiac sounds  History:        Patient has no prior history of Echocardiogram examinations.                 Signs/Symptoms:Other Cardiac sounds.  Sonographer:    Norleen Amour Referring Phys: SEGARS, JONATHAN IMPRESSIONS  1. Left ventricular ejection fraction, by estimation, is 50 to 55%. The left ventricle has low normal function. The left ventricle has no regional wall motion abnormalities. Left ventricular diastolic parameters were normal.  2. Right ventricular systolic function is normal. The right ventricular size is normal.  3. The mitral valve is normal in structure. No evidence of mitral valve regurgitation.  4. The aortic valve was not well visualized. Aortic valve regurgitation is not visualized.  5. The inferior vena cava is normal in size with greater than 50% respiratory variability, suggesting right atrial pressure of 3 mmHg. Comparison(s): No prior Echocardiogram. FINDINGS  Left Ventricle: Left ventricular ejection fraction, by estimation, is 50 to 55%. The left ventricle has low normal function. The left ventricle has no regional wall motion abnormalities. The left ventricular internal cavity size was  normal in size. There is no left ventricular hypertrophy. Left ventricular diastolic parameters were normal. Right Ventricle: The right ventricular size is normal. No increase in right ventricular wall thickness. Right ventricular systolic function is normal. Left Atrium: Left atrial size was normal in size. Right Atrium: Right atrial size was normal in size. Pericardium: There is no evidence of pericardial effusion. Mitral Valve: The mitral valve is normal in structure. No evidence of mitral valve regurgitation. Tricuspid Valve: The tricuspid valve is normal in structure. Tricuspid valve regurgitation is not demonstrated. No evidence of tricuspid stenosis. Aortic Valve: The aortic valve was not well visualized. Aortic valve regurgitation is not visualized. Pulmonic Valve: The pulmonic valve was normal in structure. Pulmonic valve regurgitation is not visualized. No evidence of pulmonic stenosis. Aorta: The aortic root and ascending aorta are structurally normal, with no evidence of dilitation. Pulmonary Artery: The pulmonary artery is of normal size. Venous: The inferior vena cava is normal in size with greater than 50% respiratory variability, suggesting right atrial pressure of 3 mmHg. IAS/Shunts: The atrial septum is grossly normal.  LEFT VENTRICLE PLAX 2D LVIDd:         4.60 cm      Diastology LVIDs:  2.60 cm      LV e' medial:    13.10 cm/s LV PW:         0.80 cm      LV E/e' medial:  6.9 LV IVS:        0.70 cm      LV e' lateral:   17.60 cm/s LVOT diam:     2.40 cm      LV E/e' lateral: 5.1 LV SV:         72 LV SV Index:   37 LVOT Area:     4.52 cm  LV Volumes (MOD) LV vol d, MOD A2C: 121.0 ml LV vol d, MOD A4C: 89.7 ml LV vol s, MOD A2C: 27.5 ml LV vol s, MOD A4C: 91.5 ml LV SV MOD A2C:     93.5 ml LV SV MOD A4C:     89.7 ml LV SV MOD BP:      54.9 ml RIGHT VENTRICLE             IVC RV S prime:     15.70 cm/s  IVC diam: 2.10 cm TAPSE (M-mode): 2.5 cm                             PULMONARY VEINS                              Diastolic Velocity: 48.80 cm/s                             S/D Velocity:       1.10                             Systolic Velocity:  51.30 cm/s LEFT ATRIUM             Index        RIGHT ATRIUM           Index LA diam:        3.00 cm 1.52 cm/m   RA Area:     12.60 cm LA Vol (A2C):   50.7 ml 25.76 ml/m  RA Volume:   28.70 ml  14.58 ml/m LA Vol (A4C):   32.3 ml 16.41 ml/m LA Biplane Vol: 42.0 ml 21.34 ml/m  AORTIC VALVE LVOT Vmax:   90.20 cm/s LVOT Vmean:  67.000 cm/s LVOT VTI:    0.160 m  AORTA Ao Root diam: 3.00 cm Ao Asc diam:  3.10 cm MITRAL VALVE MV Area (PHT): 4.39 cm    SHUNTS MV Decel Time: 173 msec    Systemic VTI:  0.16 m MV E velocity: 90.10 cm/s  Systemic Diam: 2.40 cm MV A velocity: 55.50 cm/s MV E/A ratio:  1.62 Stanly Leavens MD Electronically signed by Stanly Leavens MD Signature Date/Time: 02/10/2024/11:19:50 AM    Final    CT ANGIO CHEST/ABD/PEL FOR DISSECTION W &/OR WO CONTRAST Result Date: 02/10/2024 EXAM: CTA CHEST, ABDOMEN AND PELVIS WITH AND WITHOUT CONTRAST 02/09/2024 11:59:46 PM TECHNIQUE: CTA of the chest was performed with and without the administration of intravenous contrast. CTA of the abdomen and pelvis was performed with and without the administration of intravenous contrast. 100 mL of iohexol (OMNIPAQUE) 350 MG/ML injection was administered. Multiplanar reformatted images are provided for review. MIP images are provided for  review. Automated exposure control, iterative reconstruction, and/or weight based adjustment of the mA/kV was utilized to reduce the radiation dose to as low as reasonably achievable. COMPARISON: CT abdomen and pelvis without contrast 08/08/2013. CLINICAL HISTORY: Acute aortic syndrome (AAS) suspected. Chest pain. FINDINGS: VASCULATURE: AORTA: No acute finding. No abdominal aortic aneurysm. No dissection. PULMONARY ARTERIES: No pulmonary embolism with the limits of this exam. GREAT VESSELS OF AORTIC ARCH: No acute finding.  No dissection. No arterial occlusion or significant stenosis. CELIAC TRUNK: No acute finding. No occlusion or significant stenosis. SUPERIOR MESENTERIC ARTERY: No acute finding. No occlusion or significant stenosis. INFERIOR MESENTERIC ARTERY: No acute finding. No occlusion or significant stenosis. RENAL ARTERIES: No acute finding. No occlusion or significant stenosis. ILIAC ARTERIES: No acute finding. No occlusion or significant stenosis. There is fusiform ectasia in the distal left common iliac artery which measures 1.5 cm, compared with the remainder which measures 8 mm. CHEST: MEDIASTINUM: No mediastinal lymphadenopathy. The heart and pericardium demonstrate no acute abnormality. LUNGS AND PLEURA: There is diffuse bronchial thickening. There is subsegmental posterior atelectasis in the lung bases. There is no consolidation, effusion, or pneumothorax. Solitary 4 mm nodule noted in the right lower lobe superior segment series 8 image 88. Remaining lungs are clear. THORACIC BONES AND SOFT TISSUES: No acute bone or soft tissue abnormality. ABDOMEN AND PELVIS: LIVER: The liver is unremarkable. GALLBLADDER AND BILE DUCTS: Gallbladder is unremarkable. No biliary ductal dilatation. SPLEEN: The spleen is unremarkable. PANCREAS: The pancreas is unremarkable. ADRENAL GLANDS: Bilateral adrenal glands demonstrate no acute abnormality. There is no adrenal mass. 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 No renal mass enhancement. No urinary stone or obstruction. The bladder and ureters are unremarkable. No perinephric or periureteral stranding. GI AND BOWEL: Stomach and duodenal sweep demonstrate no acute abnormality. There is no bowel obstruction or inflammation. There is uncomplicated sigmoid diverticulosis. The appendix measures prominent compared to the prior study, now measuring 8.2 mm (previously 4 mm). Although there are no overt adjacent inflammatory changes, clinical  correlation is recommended for early appendicitis. REPRODUCTIVE: Reproductive organs are unremarkable. PERITONEUM AND RETROPERITONEUM: No ascites or free air. There is no free hemorrhage. There is no incarcerated hernia. LYMPH NODES: No lymphadenopathy. ABDOMINAL BONES AND SOFT TISSUES: There is transitional anatomy at the lumbosacral junction. Early bilateral hip arthrosis. No acute soft tissue abnormality. IMPRESSION: 1. No evidence of acute aortic syndrome. Normal CTA of the aorta and major branch arteries 2. Prominent appendix measuring 8.2 mm, previously 4 mm, without overt inflammatory changes. Findings could represent early appendicitis; recommend clinical evaluation. 3. Solitary 4 mm solid right lower lobe pulmonary nodule. No routine follow-up imaging is recommended as per Fleischner Society Guidelines. 4. 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 Remainder measures 8 mm. . Electronically signed by: Francis Quam MD 02/10/2024 12:31 AM EDT RP Workstation: [id number]   CT Head Wo Contrast Result Date: 02/10/2024 EXAM: CT HEAD AND CERVICAL SPINE 02/09/2024 11:59:46 PM TECHNIQUE: CT of the head and cervical spine was performed without the administration of intravenous contrast. Multiplanar reformatted images are provided for review. Automated exposure control, iterative reconstruction, and/or weight based adjustment of the mA/kV was utilized to reduce the radiation dose to as low as reasonably achievable. COMPARISON: None available. CLINICAL HISTORY: Head trauma, abnormal mental status (Age 74-64y). Head/neck trauma, impaired ROM, AMS. FINDINGS: CT HEAD BRAIN AND VENTRICLES: No acute intracranial hemorrhage. No mass effect or midline shift. No abnormal extra-axial fluid collection.  No evidence of acute infarct. No hydrocephalus. ORBITS: No acute abnormality. SINUSES AND MASTOIDS: No acute abnormality. SOFT TISSUES AND SKULL: No acute skull fracture. No acute soft  tissue abnormality. CT CERVICAL SPINE BONES AND ALIGNMENT: No acute fracture or traumatic malalignment. No listhesis. DEGENERATIVE CHANGES: Overall straightening of the cervical spine. SOFT TISSUES: No prevertebral soft tissue swelling. IMPRESSION: 1. No acute intracranial abnormality. 2. No acute fracture or traumatic malalignment of the cervical spine. 3. Straightening of the cervical spine, which may reflect muscle spasm or positioning. Electronically signed by: Dorethia Molt MD 02/10/2024 12:10 AM EDT RP Workstation: [id number]   CT Cervical Spine Wo Contrast Result Date: 02/10/2024 EXAM: CT HEAD AND CERVICAL SPINE 02/09/2024 11:59:46 PM TECHNIQUE: CT of the head and cervical spine was performed without the administration of intravenous contrast. Multiplanar reformatted images are provided for review. Automated exposure control, iterative reconstruction, and/or weight based adjustment of the mA/kV was utilized to reduce the radiation dose to as low as reasonably achievable. COMPARISON: None available. CLINICAL HISTORY: Head trauma, abnormal mental status (Age 10-64y). Head/neck trauma, impaired ROM, AMS. FINDINGS: CT HEAD BRAIN AND VENTRICLES: No acute intracranial hemorrhage. No mass effect or midline shift. No abnormal extra-axial fluid collection. No evidence of acute infarct. No hydrocephalus. ORBITS: No acute abnormality. SINUSES AND MASTOIDS: No acute abnormality. SOFT TISSUES AND SKULL: No acute skull fracture. No acute soft tissue abnormality. CT CERVICAL SPINE BONES AND ALIGNMENT: No acute fracture or traumatic malalignment. No listhesis. DEGENERATIVE CHANGES: Overall straightening of the cervical spine. SOFT TISSUES: No prevertebral soft tissue swelling. IMPRESSION: 1. No acute intracranial abnormality. 2. No acute fracture or traumatic malalignment of the cervical spine. 3. Straightening of the cervical spine, which may reflect muscle spasm or positioning. Electronically signed by: Dorethia Molt  MD 02/10/2024 12:10 AM EDT RP Workstation: [id number]   DG Chest Port 1 View Result Date: 02/09/2024 EXAM: 1 VIEW XRAY OF THE CHEST 02/09/2024 10:16:51 PM COMPARISON: None available. CLINICAL HISTORY: cp. Chest pain cp. Chest pain FINDINGS: LUNGS AND PLEURA: No focal pulmonary opacity. No pulmonary edema. No pleural effusion. No pneumothorax. HEART AND MEDIASTINUM: No acute abnormality of the cardiac and mediastinal silhouettes. BONES AND SOFT TISSUES: No acute osseous abnormality. IMPRESSION: 1. No acute process. Electronically signed by: Dorethia Molt MD 02/09/2024 10:25 PM EDT RP Workstation: [id number]     Time coordinating discharge: Over 30 minutes    Alm Apo, MD  [facility] 02/10/2024, 12:10 PM

## 2024-02-10 NOTE — Hospital Course (Addendum)
 Ronald [name] is a 43 yo male with PMH schizoaffective disorder, bipolar manic type, GAD, PTSD.  Recently seen by psych on 9/13 under IVC from mom due to concern for lack of self care and ongoing impaired judgement/insight. Was having bizarre delusions (God and aliens communicating, creating new DNA).   Presents this hospitalization with CP after a nearsyncope fall. Initially STEMI but cancelled after review by cardiology. Has chronic EKG changes notably ST elevations.  History is that he was going downstairs for dinner and walking into living room upon becoming dizzy/lightheaded and fell to the ground bracing with hands upon landing. No full LOC per patient.   Right after this episode he had central chest pain described as a burning pain which lasted about 1.5 hours and has not recurred. No associated SOB, N/V/ diaphoresis. He has never had any similar episodes of chest pain in the past. He typically has good exercise tolerance and runs about 2 miles without any exertional symptoms, although stopped about 2 months ago. No exertional symptoms in the meantime. He has had no other recent illness, fever/chills, cough / cold, SOB, Abd pain, N/V/D, blood in stool, rashes.    Assessment/Plan:   Nearsyncope with fall  Chest pain  - initially code STEMI but cancelled after review by cardiology - trops negative x 2 - CTA chest/abd/pelvis negative for acute aortic syndrome  - CTH and CT C-spine negative for acute abnormalities  - etiology considered noncardiac and felt to be related to hypovolemia and CP related to fall incured with MSK component -Feels well and back to normal when seen in the ER morning following admission - no further workup recommended per cardiology - Echo performed during hospitalization: EF 50 to 55%, no RWMA, normal diastolic parameters.  Normal RV function and size.  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

## 2024-02-10 NOTE — ED Notes (Signed)
 Called pts mother and addressed her concerns. She stated pt had not been taking his Haldol  for the past couple of weeks and has missed some appointments with his PCP.

## 2024-02-10 NOTE — ED Notes (Signed)
Pt ambulated to the RR well  

## 2024-02-10 NOTE — ED Notes (Signed)
 [name] PT mother would like to speak to someone. States its really urgent  [phone]

## 2024-02-10 NOTE — Consult Note (Signed)
 Cardiology Consultation   Patient ID: Ronald [name] MRN: [id number]; DOB: [birth year]  Admit date: 02/09/2024 Date of Consult: 02/10/2024  PCP:  Ronald Debby CROME, MD   [name] Providers Cardiologist:  None        Patient Profile: Ronald [name] is a 43 y.o. male with a hx of PTSD who is being seen 02/10/2024 for the evaluation of chest pain at the request of the Emergency Department.  History of Present Illness: Ronald [name] states he had an episode where he felt dizzy after not eating all day, when he had a mechanical fall.  He states he did not lose consciousness whenever he fell, but he did hit his head.  Mother witnessed the fall and called 911.  He states he had chest pain sometime immediately after he fell.  The chest pain lasted for about an hour it was sharp, left sided and not associated with exertion.  Prior to this episode, he states he was in his usual state of health.  He was systems negative for syncope, leg swelling, orthopnea, paroxysmal nocturnal dyspnea.  Upon EMS arrival, ECG demonstrated ST elevations in the inferior leads with ST ST elevations in V4 through V6, Cath Lab was initially activated, however was subsequently deactivated by the interventional cardiologist.  Patient has never seen a cardiologist in the past.  Has never had a stress test.  He is no family history of sudden cardiac death or cardiac implantable electronic devices.  Previous ECGs show sinus rhythm with chronic ST elevations in the inferior leads as well as leads V4 through V6  Notable labs include troponin of 3->3.     Past Medical History:  Diagnosis Date   Anxiety    Arthritis    Disorder of pineal gland    Post traumatic stress disorder    Schizoaffective disorder, bipolar type (HCC) 08/29/2023   Severe manic bipolar 1 disorder with psychotic behavior (HCC) 08/29/2023    Past Surgical History:  Procedure Laterality Date   ANKLE SURGERY       Home Medications:   Prior to Admission medications   Medication Sig Start Date End Date Taking? Authorizing Provider  benztropine  (COGENTIN ) 1 MG tablet Take 1 tablet (1 mg total) by mouth 2 (two) times daily as needed (with haldol  during agitation protocol not to exceed 2 mg daily). 01/04/24   Lenard Calin, MD  divalproex  (DEPAKOTE ) 500 MG DR tablet Take 2 tablets (1,000 mg total) by mouth 2 (two) times daily. 01/04/24   Lenard Calin, MD  gabapentin  (NEURONTIN ) 300 MG capsule Take 1 capsule (300 mg total) by mouth every 12 (twelve) hours. 01/04/24   Lenard Calin, MD  haloperidol  (HALDOL ) 5 MG tablet Take 3 tablets (15 mg total) by mouth at bedtime. 01/04/24   Lenard Calin, MD  magnesium  oxide (MAG-OX) 400 (240 Mg) MG tablet Take 400 mg by mouth at bedtime.    [provider]  melatonin 5 MG TABS Take 1 tablet (5 mg total) by mouth at bedtime. 01/04/24   Lenard Calin, MD  Multiple Vitamins-Minerals (MULTIVITAMIN WITH MINERALS) tablet Take 1 tablet by mouth daily.    [provider]  nicotine  (NICODERM CQ  - DOSED IN MG/24 HOURS) 21 mg/24hr patch Place 1 patch (21 mg total) onto the skin daily. 01/05/24   Lenard Calin, MD  nicotine  polacrilex (NICORETTE ) 2 MG gum Take 1 each (2 mg total) by mouth as needed for smoking cessation. 01/04/24   Lenard Calin, MD  traZODone  (DESYREL ) 50  MG tablet Take 1 tablet (50 mg total) by mouth at bedtime as needed and may repeat dose one time if needed for sleep. 01/04/24   Lenard Calin, MD    Scheduled Meds:  divalproex   1,000 mg Oral BID   enoxaparin (LOVENOX) injection  40 mg Subcutaneous Q24H   gabapentin   300 mg Oral Q12H   haloperidol   15 mg Oral QHS   sodium chloride  flush  3 mL Intravenous Q12H   Continuous Infusions:  sodium chloride      PRN Meds: acetaminophen , albuterol , alum & mag hydroxide-simeth, benztropine , famotidine, lidocaine, melatonin, methocarbamol, nicotine , nicotine  polacrilex, ondansetron  (ZOFRAN ) IV, polyethylene  glycol, traZODone   Allergies:    Allergies  Allergen Reactions   Hydroxyzine  Itching, Anxiety and Other (See Comments)    Hyper, too   Alupent [Metaproterenol] Other (See Comments)    Hyperactivity   Benadryl  [Diphenhydramine ] Other (See Comments)    Pt states that this makes him feel insane   Klonopin  [Clonazepam ] Other (See Comments)    Patient feels Ativan  is a better fit for him than Klonopin - effect is not as strong.   Other Other (See Comments)    Pt reports that all antihistamines make him feel insane   Quetiapine      Hallucinations    Zyprexa  [Olanzapine ] Other (See Comments)    This made me feel weird.    Social History:   Social History   Socioeconomic History   Marital status: Single    Spouse name: Not on file   Number of children: Not on file   Years of education: Not on file   Highest education level: Not on file  Occupational History   Not on file  Tobacco Use   Smoking status: Never   Smokeless tobacco: Current    Types: Chew  Vaping Use   Vaping status: Never Used  Substance and Sexual Activity   Alcohol use: No   Drug use: Yes    Types: Marijuana    Comment: one year ago   Sexual activity: Not Currently  Other Topics Concern   Not on file  Social History Narrative   Not on file   Social Drivers of Health   Financial Resource Strain: Not on file  Food Insecurity: No Food Insecurity (12/27/2023)   Hunger Vital Sign    Worried About Running Out of Food in the Last Year: Never true    Ran Out of Food in the Last Year: Never true  Transportation Needs: No Transportation Needs (12/27/2023)   PRAPARE - Administrator, Civil Service (Medical): No    Lack of Transportation (Non-Medical): No  Physical Activity: Not on file  Stress: Not on file  Social Connections: Not on file  Intimate Partner Violence: Not At Risk (12/27/2023)   Humiliation, Afraid, Rape, and Kick questionnaire    Fear of Current or Ex-Partner: No     Emotionally Abused: No    Physically Abused: No    Sexually Abused: No    Family History:   No family history on file.   ROS:  Please see the history of present illness.   All other ROS reviewed and negative.     Physical Exam/Data: Vitals:   02/09/24 2152 02/09/24 2153 02/10/24 0130 02/10/24 0144  BP:  111/79 112/79   Pulse: 66  73   Resp: 16  (!) 23   Temp: 97.8 F (36.6 C)   97.7 F (36.5 C)  TempSrc: Oral   Axillary  SpO2:  100%  100%   Weight: 77.1 kg     Height: 5' 11 (1.803 m)      No intake or output data in the 24 hours ending 02/10/24 0211    02/09/2024    9:52 PM 12/27/2023    3:45 PM 10/20/2023    6:43 PM  Last 3 Weights  Weight (lbs) 170 lb    Weight (kg) 77.111 kg       Information is confidential and restricted. Go to Review Flowsheets to unlock data.     Body mass index is 23.71 kg/m.  General:  Well nourished, well developed, in no acute distress HEENT: normal Neck: no JVD Vascular: No carotid bruits; Distal pulses 2+ bilaterally Cardiac:  normal S1, S2; RRR; no murmur  Lungs:  clear to auscultation bilaterally, no wheezing, rhonchi or rales  Abd: soft, nontender, no hepatomegaly  Ext: no edema Musculoskeletal:  No deformities, BUE and BLE strength normal and equal Skin: warm and dry  Neuro:  CNs 2-12 intact, no focal abnormalities noted Psych:  Normal affect   EKG:  The EKG was personally reviewed and demonstrates:   Telemetry:  Telemetry was personally reviewed and demonstrates:  sinus rhythm  Relevant CV Studies:  none  Laboratory Data: High Sensitivity Troponin:   Recent Labs  Lab 02/09/24 2159 02/10/24 0046  TROPONINIHS 3 3     Chemistry Recent Labs  Lab 02/09/24 2159  NA 134*  K 3.7  CL 99  CO2 26  GLUCOSE 118*  BUN 7  CREATININE 1.12  CALCIUM 8.3*  GFRNONAA >60  ANIONGAP 9    Recent Labs  Lab 02/09/24 2159  PROT 6.0*  ALBUMIN 3.3*  AST 11*  ALT 10  ALKPHOS 34*  BILITOT 0.2   Lipids  Recent Labs  Lab  02/09/24 2159  CHOL 96  TRIG 57  HDL 43  LDLCALC 42  CHOLHDL 2.2    Hematology Recent Labs  Lab 02/09/24 2159  WBC 4.6  RBC 4.44  HGB 13.6  HCT 40.0  MCV 90.1  MCH 30.6  MCHC 34.0  RDW 12.9  PLT 241   Thyroid  No results for input(s): TSH, FREET4 in the last 168 hours.  BNPNo results for input(s): BNP, PROBNP in the last 168 hours.  DDimer No results for input(s): DDIMER in the last 168 hours.  Radiology/Studies:  CT ANGIO CHEST/ABD/PEL FOR DISSECTION W &/OR WO CONTRAST Result Date: 02/10/2024 EXAM: CTA CHEST, ABDOMEN AND PELVIS WITH AND WITHOUT CONTRAST 02/09/2024 11:59:46 PM TECHNIQUE: CTA of the chest was performed with and without the administration of intravenous contrast. CTA of the abdomen and pelvis was performed with and without the administration of intravenous contrast. 100 mL of iohexol (OMNIPAQUE) 350 MG/ML injection was administered. Multiplanar reformatted images are provided for review. MIP images are provided for review. Automated exposure control, iterative reconstruction, and/or weight based adjustment of the mA/kV was utilized to reduce the radiation dose to as low as reasonably achievable. COMPARISON: CT abdomen and pelvis without contrast 08/08/2013. CLINICAL HISTORY: Acute aortic syndrome (AAS) suspected. Chest pain. FINDINGS: VASCULATURE: AORTA: No acute finding. No abdominal aortic aneurysm. No dissection. PULMONARY ARTERIES: No pulmonary embolism with the limits of this exam. GREAT VESSELS OF AORTIC ARCH: No acute finding. No dissection. No arterial occlusion or significant stenosis. CELIAC TRUNK: No acute finding. No occlusion or significant stenosis. SUPERIOR MESENTERIC ARTERY: No acute finding. No occlusion or significant stenosis. INFERIOR MESENTERIC ARTERY: No acute finding. No occlusion or significant stenosis. RENAL ARTERIES: No acute finding. No  occlusion or significant stenosis. ILIAC ARTERIES: No acute finding. No occlusion or significant  stenosis. There is fusiform ectasia in the distal left common iliac artery which measures 1.5 cm, compared with the remainder which measures 8 mm. CHEST: MEDIASTINUM: No mediastinal lymphadenopathy. The heart and pericardium demonstrate no acute abnormality. LUNGS AND PLEURA: There is diffuse bronchial thickening. There is subsegmental posterior atelectasis in the lung bases. There is no consolidation, effusion, or pneumothorax. Solitary 4 mm nodule noted in the right lower lobe superior segment series 8 image 88. Remaining lungs are clear. THORACIC BONES AND SOFT TISSUES: No acute bone or soft tissue abnormality. ABDOMEN AND PELVIS: LIVER: The liver is unremarkable. GALLBLADDER AND BILE DUCTS: Gallbladder is unremarkable. No biliary ductal dilatation. SPLEEN: The spleen is unremarkable. PANCREAS: The pancreas is unremarkable. ADRENAL GLANDS: Bilateral adrenal glands demonstrate no acute abnormality. There is no adrenal mass. 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 No renal mass enhancement. No urinary stone or obstruction. The bladder and ureters are unremarkable. No perinephric or periureteral stranding. GI AND BOWEL: Stomach and duodenal sweep demonstrate no acute abnormality. There is no bowel obstruction or inflammation. There is uncomplicated sigmoid diverticulosis. The appendix measures prominent compared to the prior study, now measuring 8.2 mm (previously 4 mm). Although there are no overt adjacent inflammatory changes, clinical correlation is recommended for early appendicitis. REPRODUCTIVE: Reproductive organs are unremarkable. PERITONEUM AND RETROPERITONEUM: No ascites or free air. There is no free hemorrhage. There is no incarcerated hernia. LYMPH NODES: No lymphadenopathy. ABDOMINAL BONES AND SOFT TISSUES: There is transitional anatomy at the lumbosacral junction. Early bilateral hip arthrosis. No acute soft tissue abnormality. IMPRESSION: 1. No evidence of  acute aortic syndrome. Normal CTA of the aorta and major branch arteries 2. Prominent appendix measuring 8.2 mm, previously 4 mm, without overt inflammatory changes. Findings could represent early appendicitis; recommend clinical evaluation. 3. Solitary 4 mm solid right lower lobe pulmonary nodule. No routine follow-up imaging is recommended as per Fleischner Society Guidelines. 4. 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 Remainder measures 8 mm. . Electronically signed by: Francis Quam MD 02/10/2024 12:31 AM EDT RP Workstation: [id number]   CT Head Wo Contrast Result Date: 02/10/2024 EXAM: CT HEAD AND CERVICAL SPINE 02/09/2024 11:59:46 PM TECHNIQUE: CT of the head and cervical spine was performed without the administration of intravenous contrast. Multiplanar reformatted images are provided for review. Automated exposure control, iterative reconstruction, and/or weight based adjustment of the mA/kV was utilized to reduce the radiation dose to as low as reasonably achievable. COMPARISON: None available. CLINICAL HISTORY: Head trauma, abnormal mental status (Age 30-64y). Head/neck trauma, impaired ROM, AMS. FINDINGS: CT HEAD BRAIN AND VENTRICLES: No acute intracranial hemorrhage. No mass effect or midline shift. No abnormal extra-axial fluid collection. No evidence of acute infarct. No hydrocephalus. ORBITS: No acute abnormality. SINUSES AND MASTOIDS: No acute abnormality. SOFT TISSUES AND SKULL: No acute skull fracture. No acute soft tissue abnormality. CT CERVICAL SPINE BONES AND ALIGNMENT: No acute fracture or traumatic malalignment. No listhesis. DEGENERATIVE CHANGES: Overall straightening of the cervical spine. SOFT TISSUES: No prevertebral soft tissue swelling. IMPRESSION: 1. No acute intracranial abnormality. 2. No acute fracture or traumatic malalignment of the cervical spine. 3. Straightening of the cervical spine, which may reflect muscle spasm or positioning.  Electronically signed by: Dorethia Molt MD 02/10/2024 12:10 AM EDT RP Workstation: [id number]   CT Cervical Spine Wo Contrast Result Date: 02/10/2024 EXAM: CT HEAD AND CERVICAL SPINE 02/09/2024  11:59:46 PM TECHNIQUE: CT of the head and cervical spine was performed without the administration of intravenous contrast. Multiplanar reformatted images are provided for review. Automated exposure control, iterative reconstruction, and/or weight based adjustment of the mA/kV was utilized to reduce the radiation dose to as low as reasonably achievable. COMPARISON: None available. CLINICAL HISTORY: Head trauma, abnormal mental status (Age 39-64y). Head/neck trauma, impaired ROM, AMS. FINDINGS: CT HEAD BRAIN AND VENTRICLES: No acute intracranial hemorrhage. No mass effect or midline shift. No abnormal extra-axial fluid collection. No evidence of acute infarct. No hydrocephalus. ORBITS: No acute abnormality. SINUSES AND MASTOIDS: No acute abnormality. SOFT TISSUES AND SKULL: No acute skull fracture. No acute soft tissue abnormality. CT CERVICAL SPINE BONES AND ALIGNMENT: No acute fracture or traumatic malalignment. No listhesis. DEGENERATIVE CHANGES: Overall straightening of the cervical spine. SOFT TISSUES: No prevertebral soft tissue swelling. IMPRESSION: 1. No acute intracranial abnormality. 2. No acute fracture or traumatic malalignment of the cervical spine. 3. Straightening of the cervical spine, which may reflect muscle spasm or positioning. Electronically signed by: Dorethia Molt MD 02/10/2024 12:10 AM EDT RP Workstation: [id number]   DG Chest Port 1 View Result Date: 02/09/2024 EXAM: 1 VIEW XRAY OF THE CHEST 02/09/2024 10:16:51 PM COMPARISON: None available. CLINICAL HISTORY: cp. Chest pain cp. Chest pain FINDINGS: LUNGS AND PLEURA: No focal pulmonary opacity. No pulmonary edema. No pleural effusion. No pneumothorax. HEART AND MEDIASTINUM: No acute abnormality of the cardiac and mediastinal silhouettes. BONES  AND SOFT TISSUES: No acute osseous abnormality. IMPRESSION: 1. No acute process. Electronically signed by: Dorethia Molt MD 02/09/2024 10:25 PM EDT RP Workstation: [id number]     Assessment and Plan:  TAGGERT BOZZI is a 43 y.o. male with a hx of PTSD who is being seen 02/10/2024 for the evaluation of chest pain at the request of the Emergency Department.  Chest pain is noncardiovascular, and likely musculoskeletal in the setting of his mechanical fall.  His dizziness is is more so explained by dehydration.  I do not have suspicion for malignant ventricular arrhythmia, as he has no Durning features on ECG.  Assess his heart function with an echocardiogram prior to discharge, otherwise no further workup from a cardiovascular standpoint.  Please notify us  if he does have another episode of chest pain. TTE Goal K>4, Mg>2   Risk Assessment/Risk Scores:              For questions or updates, please contact [facility] Please consult [email or web address] for contact info under      Signed, Karmella Bouvier A Alija Riano, MD  02/10/2024 2:11 AM

## 2024-02-10 NOTE — ED Notes (Signed)
 Talked to pt's mother on the phone. She is coming to pick him up from hospital once he is discharged.

## 2024-02-13 NOTE — Telephone Encounter (Signed)
 Patient has an appointment scheduled.

## 2024-02-13 NOTE — Telephone Encounter (Signed)
 Spoke with the patients mom. Advised her that Kordel has an appointment  and he will get his medication then. She gave a verbal understanding.

## 2024-03-04 ENCOUNTER — Encounter: Payer: Self-pay | Admitting: Internal Medicine

## 2024-03-04 ENCOUNTER — Ambulatory Visit (INDEPENDENT_AMBULATORY_CARE_PROVIDER_SITE_OTHER): Payer: Self-pay | Admitting: Internal Medicine

## 2024-03-04 VITALS — BP 120/76 | HR 64 | Temp 97.9°F | Ht 71.0 in | Wt 173.8 lb

## 2024-03-04 DIAGNOSIS — F200 Paranoid schizophrenia: Secondary | ICD-10-CM

## 2024-03-04 DIAGNOSIS — F302 Manic episode, severe with psychotic symptoms: Secondary | ICD-10-CM

## 2024-03-04 DIAGNOSIS — F1994 Other psychoactive substance use, unspecified with psychoactive substance-induced mood disorder: Secondary | ICD-10-CM

## 2024-03-04 MED ORDER — HALOPERIDOL 10 MG PO TABS: 10.0000 mg | ORAL_TABLET | Freq: Every day | ORAL | 1 refills | Status: DC

## 2024-03-04 MED ORDER — DIVALPROEX SODIUM 500 MG PO DR TAB: 1000.0000 mg | DELAYED_RELEASE_TABLET | Freq: Two times a day (BID) | ORAL | 1 refills | Status: AC

## 2024-03-04 MED ORDER — TRAZODONE HCL 50 MG PO TABS: 50.0000 mg | ORAL_TABLET | Freq: Every day | ORAL | 1 refills | Status: AC

## 2024-03-04 NOTE — Progress Notes (Signed)
 Subjective:  Patient ID: Ronald [name], male    DOB: 1980/12/20  Age: 43 y.o. MRN: [id number]  CC: Medical Management of Chronic Issues (Medication refills with a 3 month supply. )   HPI Ronald [name] presents for f/up   Discussed the use of AI scribe software for clinical note transcription with the patient, who gave verbal consent to proceed.  History of Present Illness Ronald [name] is a 43 year old male who presents for a checkup and medication refill.  He recently experienced a fall, which he attributes to taking his sleeping medication before dinner. Following the fall, he was evaluated and found to have early repolarization of the heart, described as an 'athlete's heart'. He reports that he was told his appendix was a little swollen after a CT scan, but he has no abdominal pain, nausea, vomiting, diarrhea, or constipation. No head injury from the fall, although he landed on his head. He was admitted to the ER for evaluation, and further imaging was performed.  He is currently taking Depakote  and has experienced anxiety attacks when his medication supply runs out. He requests a 90-day refill to avoid interruptions. No side effects from Depakote , including testicular pain, swelling, or urinary issues.  He runs approximately two miles a day and describes himself as an athlete. He denies smoking or drinking but uses chewing tobacco. He takes vitamin C and D daily for natural immunity and refused all vaccines today.     Outpatient Medications Prior to Visit  Medication Sig Dispense Refill   gabapentin  (NEURONTIN ) 100 MG capsule Take 200 mg by mouth 3 (three) times daily.     ibuprofen  (ADVIL ) 200 MG tablet Take 400 mg by mouth 2 (two) times daily as needed for headache (pain).     Multiple Vitamins-Minerals (MENS MULTIVITAMIN) TABS Take 1 tablet by mouth daily.     divalproex  (DEPAKOTE ) 500 MG DR tablet Take 2 tablets (1,000 mg total) by mouth 2 (two) times  daily. 120 tablet 0   haloperidol  (HALDOL ) 10 MG tablet Take 10 mg by mouth at bedtime as needed (severe anxiety).     traZODone  (DESYREL ) 50 MG tablet Take 1 tablet (50 mg total) by mouth at bedtime as needed and may repeat dose one time if needed for sleep. (Patient taking differently: Take 50 mg by mouth at bedtime.) 30 tablet 0   No facility-administered medications prior to visit.    ROS Review of Systems  Constitutional: Negative.  Negative for appetite change, chills, diaphoresis, fatigue and fever.  HENT: Negative.    Eyes: Negative.   Respiratory:  Negative for cough, chest tightness, shortness of breath and wheezing.   Cardiovascular:  Negative for chest pain, palpitations and leg swelling.  Gastrointestinal: Negative.  Negative for abdominal pain, constipation, diarrhea, nausea and vomiting.  Endocrine: Negative.   Genitourinary: Negative.  Negative for difficulty urinating, penile pain, penile swelling and scrotal swelling.  Musculoskeletal: Negative.   Skin: Negative.   Neurological:  Negative for dizziness and weakness.  Hematological:  Negative for adenopathy. Does not bruise/bleed easily.  Psychiatric/Behavioral:  Positive for dysphoric mood and sleep disturbance. Negative for agitation, behavioral problems, confusion, decreased concentration, hallucinations, self-injury and suicidal ideas. The patient is nervous/anxious. The patient is not hyperactive.     Objective:  BP 120/76 (BP Location: Left Arm, Patient Position: Sitting, Cuff Size: Normal)   Pulse 64   Temp 97.9 F (36.6 C) (Oral)   Ht 5' 11 (1.803 m)   Wt 173  lb 12.8 oz (78.8 kg)   SpO2 98%   BMI 24.24 kg/m   BP Readings from Last 3 Encounters:  03/04/24 120/76  02/10/24 137/84  12/27/23 122/76    Wt Readings from Last 3 Encounters:  03/04/24 173 lb 12.8 oz (78.8 kg)  02/09/24 170 lb (77.1 kg)  06/26/23 152 lb 1.9 oz (69 kg)    Physical Exam Vitals reviewed.  HENT:     Nose: Nose normal.      Mouth/Throat:     Mouth: Mucous membranes are moist.  Eyes:     General: No scleral icterus.    Conjunctiva/sclera: Conjunctivae normal.  Cardiovascular:     Rate and Rhythm: Normal rate and regular rhythm.     Heart sounds: No murmur heard.    No friction rub. No gallop.  Pulmonary:     Effort: Pulmonary effort is normal.     Breath sounds: No stridor. No wheezing, rhonchi or rales.  Abdominal:     General: Abdomen is flat. Bowel sounds are normal. There is no distension.     Palpations: There is no hepatomegaly, splenomegaly or mass.     Tenderness: There is no abdominal tenderness. There is no guarding.     Hernia: No hernia is present.  Musculoskeletal:        General: Normal range of motion.     Cervical back: Neck supple.     Right lower leg: No edema.     Left lower leg: No edema.  Lymphadenopathy:     Cervical: No cervical adenopathy.  Skin:    General: Skin is warm and dry.  Neurological:     Mental Status: He is alert.  Psychiatric:        Attention and Perception: Perception normal. He is inattentive.        Mood and Affect: Mood is anxious. Mood is not depressed or elated. Affect is not labile, flat, tearful or inappropriate.        Speech: Speech normal. Speech is not delayed or tangential.        Behavior: Behavior normal. Behavior is cooperative.        Thought Content: Thought content normal. Thought content is not paranoid or delusional. Thought content does not include homicidal or suicidal ideation. Thought content does not include homicidal plan.        Cognition and Memory: Cognition normal.     Lab Results  Component Value Date   WBC 7.1 02/10/2024   HGB 14.5 02/10/2024   HCT 41.4 02/10/2024   PLT 286 02/10/2024   GLUCOSE 98 02/10/2024   CHOL 96 02/09/2024   TRIG 57 02/09/2024   HDL 43 02/09/2024   LDLCALC 42 02/09/2024   ALT 10 02/09/2024   AST 11 (L) 02/09/2024   NA 138 02/10/2024   K 4.1 02/10/2024   CL 103 02/10/2024   CREATININE 1.02  02/10/2024   BUN 10 02/10/2024   CO2 28 02/10/2024   TSH 0.830 02/10/2024   INR 1.0 02/09/2024   HGBA1C 4.8 02/09/2024    ECHOCARDIOGRAM COMPLETE Result Date: 02/10/2024    ECHOCARDIOGRAM REPORT   Patient Name:   Ronald [name] Date of Exam: 02/10/2024 Medical Rec #:  [id number]       Height:       71.0 in Accession #:    [id number]      Weight:       170.0 lb Date of Birth:  Dec 23, 1980      BSA:  1.968 m Patient Age:    42 years        BP:           134/70 mmHg Patient Gender: M               HR:           66 bpm. Exam Location:  Inpatient Procedure: 2D Echo (Both Spectral and Color Flow Doppler were utilized during            procedure). Indications:    Other Cardiac sounds  History:        Patient has no prior history of Echocardiogram examinations.                 Signs/Symptoms:Other Cardiac sounds.  Sonographer:    Norleen Amour Referring Phys: SEGARS, JONATHAN IMPRESSIONS  1. Left ventricular ejection fraction, by estimation, is 50 to 55%. The left ventricle has low normal function. The left ventricle has no regional wall motion abnormalities. Left ventricular diastolic parameters were normal.  2. Right ventricular systolic function is normal. The right ventricular size is normal.  3. The mitral valve is normal in structure. No evidence of mitral valve regurgitation.  4. The aortic valve was not well visualized. Aortic valve regurgitation is not visualized.  5. The inferior vena cava is normal in size with greater than 50% respiratory variability, suggesting right atrial pressure of 3 mmHg. Comparison(s): No prior Echocardiogram. FINDINGS  Left Ventricle: Left ventricular ejection fraction, by estimation, is 50 to 55%. The left ventricle has low normal function. The left ventricle has no regional wall motion abnormalities. The left ventricular internal cavity size was normal in size. There is no left ventricular hypertrophy. Left ventricular diastolic parameters were normal. Right  Ventricle: The right ventricular size is normal. No increase in right ventricular wall thickness. Right ventricular systolic function is normal. Left Atrium: Left atrial size was normal in size. Right Atrium: Right atrial size was normal in size. Pericardium: There is no evidence of pericardial effusion. Mitral Valve: The mitral valve is normal in structure. No evidence of mitral valve regurgitation. Tricuspid Valve: The tricuspid valve is normal in structure. Tricuspid valve regurgitation is not demonstrated. No evidence of tricuspid stenosis. Aortic Valve: The aortic valve was not well visualized. Aortic valve regurgitation is not visualized. Pulmonic Valve: The pulmonic valve was normal in structure. Pulmonic valve regurgitation is not visualized. No evidence of pulmonic stenosis. Aorta: The aortic root and ascending aorta are structurally normal, with no evidence of dilitation. Pulmonary Artery: The pulmonary artery is of normal size. Venous: The inferior vena cava is normal in size with greater than 50% respiratory variability, suggesting right atrial pressure of 3 mmHg. IAS/Shunts: The atrial septum is grossly normal.  LEFT VENTRICLE PLAX 2D LVIDd:         4.60 cm      Diastology LVIDs:         2.60 cm      LV e' medial:    13.10 cm/s LV PW:         0.80 cm      LV E/e' medial:  6.9 LV IVS:        0.70 cm      LV e' lateral:   17.60 cm/s LVOT diam:     2.40 cm      LV E/e' lateral: 5.1 LV SV:         72 LV SV Index:   37 LVOT Area:  4.52 cm  LV Volumes (MOD) LV vol d, MOD A2C: 121.0 ml LV vol d, MOD A4C: 89.7 ml LV vol s, MOD A2C: 27.5 ml LV vol s, MOD A4C: 91.5 ml LV SV MOD A2C:     93.5 ml LV SV MOD A4C:     89.7 ml LV SV MOD BP:      54.9 ml RIGHT VENTRICLE             IVC RV S prime:     15.70 cm/s  IVC diam: 2.10 cm TAPSE (M-mode): 2.5 cm                             PULMONARY VEINS                             Diastolic Velocity: 48.80 cm/s                             S/D Velocity:       1.10                              Systolic Velocity:  51.30 cm/s LEFT ATRIUM             Index        RIGHT ATRIUM           Index LA diam:        3.00 cm 1.52 cm/m   RA Area:     12.60 cm LA Vol (A2C):   50.7 ml 25.76 ml/m  RA Volume:   28.70 ml  14.58 ml/m LA Vol (A4C):   32.3 ml 16.41 ml/m LA Biplane Vol: 42.0 ml 21.34 ml/m  AORTIC VALVE LVOT Vmax:   90.20 cm/s LVOT Vmean:  67.000 cm/s LVOT VTI:    0.160 m  AORTA Ao Root diam: 3.00 cm Ao Asc diam:  3.10 cm MITRAL VALVE MV Area (PHT): 4.39 cm    SHUNTS MV Decel Time: 173 msec    Systemic VTI:  0.16 m MV E velocity: 90.10 cm/s  Systemic Diam: 2.40 cm MV A velocity: 55.50 cm/s MV E/A ratio:  1.62 Stanly Leavens MD Electronically signed by Stanly Leavens MD Signature Date/Time: 02/10/2024/11:19:50 AM    Final    CT ANGIO CHEST/ABD/PEL FOR DISSECTION W &/OR WO CONTRAST Result Date: 02/10/2024 EXAM: CTA CHEST, ABDOMEN AND PELVIS WITH AND WITHOUT CONTRAST 02/09/2024 11:59:46 PM TECHNIQUE: CTA of the chest was performed with and without the administration of intravenous contrast. CTA of the abdomen and pelvis was performed with and without the administration of intravenous contrast. 100 mL of iohexol (OMNIPAQUE) 350 MG/ML injection was administered. Multiplanar reformatted images are provided for review. MIP images are provided for review. Automated exposure control, iterative reconstruction, and/or weight based adjustment of the mA/kV was utilized to reduce the radiation dose to as low as reasonably achievable. COMPARISON: CT abdomen and pelvis without contrast 08/08/2013. CLINICAL HISTORY: Acute aortic syndrome (AAS) suspected. Chest pain. FINDINGS: VASCULATURE: AORTA: No acute finding. No abdominal aortic aneurysm. No dissection. PULMONARY ARTERIES: No pulmonary embolism with the limits of this exam. GREAT VESSELS OF AORTIC ARCH: No acute finding. No dissection. No arterial occlusion or significant stenosis. CELIAC TRUNK: No acute finding. No occlusion or  significant stenosis. SUPERIOR MESENTERIC ARTERY: No acute finding. No  occlusion or significant stenosis. INFERIOR MESENTERIC ARTERY: No acute finding. No occlusion or significant stenosis. RENAL ARTERIES: No acute finding. No occlusion or significant stenosis. ILIAC ARTERIES: No acute finding. No occlusion or significant stenosis. There is fusiform ectasia in the distal left common iliac artery which measures 1.5 cm, compared with the remainder which measures 8 mm. CHEST: MEDIASTINUM: No mediastinal lymphadenopathy. The heart and pericardium demonstrate no acute abnormality. LUNGS AND PLEURA: There is diffuse bronchial thickening. There is subsegmental posterior atelectasis in the lung bases. There is no consolidation, effusion, or pneumothorax. Solitary 4 mm nodule noted in the right lower lobe superior segment series 8 image 88. Remaining lungs are clear. THORACIC BONES AND SOFT TISSUES: No acute bone or soft tissue abnormality. ABDOMEN AND PELVIS: LIVER: The liver is unremarkable. GALLBLADDER AND BILE DUCTS: Gallbladder is unremarkable. No biliary ductal dilatation. SPLEEN: The spleen is unremarkable. PANCREAS: The pancreas is unremarkable. ADRENAL GLANDS: Bilateral adrenal glands demonstrate no acute abnormality. There is no adrenal mass. 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 No renal mass enhancement. No urinary stone or obstruction. The bladder and ureters are unremarkable. No perinephric or periureteral stranding. GI AND BOWEL: Stomach and duodenal sweep demonstrate no acute abnormality. There is no bowel obstruction or inflammation. There is uncomplicated sigmoid diverticulosis. The appendix measures prominent compared to the prior study, now measuring 8.2 mm (previously 4 mm). Although there are no overt adjacent inflammatory changes, clinical correlation is recommended for early appendicitis. REPRODUCTIVE: Reproductive organs are unremarkable. PERITONEUM AND  RETROPERITONEUM: No ascites or free air. There is no free hemorrhage. There is no incarcerated hernia. LYMPH NODES: No lymphadenopathy. ABDOMINAL BONES AND SOFT TISSUES: There is transitional anatomy at the lumbosacral junction. Early bilateral hip arthrosis. No acute soft tissue abnormality. IMPRESSION: 1. No evidence of acute aortic syndrome. Normal CTA of the aorta and major branch arteries 2. Prominent appendix measuring 8.2 mm, previously 4 mm, without overt inflammatory changes. Findings could represent early appendicitis; recommend clinical evaluation. 3. Solitary 4 mm solid right lower lobe pulmonary nodule. No routine follow-up imaging is recommended as per Fleischner Society Guidelines. 4. 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 Remainder measures 8 mm. . Electronically signed by: Francis Quam MD 02/10/2024 12:31 AM EDT RP Workstation: [id number]   CT Head Wo Contrast Result Date: 02/10/2024 EXAM: CT HEAD AND CERVICAL SPINE 02/09/2024 11:59:46 PM TECHNIQUE: CT of the head and cervical spine was performed without the administration of intravenous contrast. Multiplanar reformatted images are provided for review. Automated exposure control, iterative reconstruction, and/or weight based adjustment of the mA/kV was utilized to reduce the radiation dose to as low as reasonably achievable. COMPARISON: None available. CLINICAL HISTORY: Head trauma, abnormal mental status (Age 36-64y). Head/neck trauma, impaired ROM, AMS. FINDINGS: CT HEAD BRAIN AND VENTRICLES: No acute intracranial hemorrhage. No mass effect or midline shift. No abnormal extra-axial fluid collection. No evidence of acute infarct. No hydrocephalus. ORBITS: No acute abnormality. SINUSES AND MASTOIDS: No acute abnormality. SOFT TISSUES AND SKULL: No acute skull fracture. No acute soft tissue abnormality. CT CERVICAL SPINE BONES AND ALIGNMENT: No acute fracture or traumatic malalignment. No listhesis.  DEGENERATIVE CHANGES: Overall straightening of the cervical spine. SOFT TISSUES: No prevertebral soft tissue swelling. IMPRESSION: 1. No acute intracranial abnormality. 2. No acute fracture or traumatic malalignment of the cervical spine. 3. Straightening of the cervical spine, which may reflect muscle spasm or positioning. Electronically signed by: Dorethia Molt MD 02/10/2024 12:10 AM EDT  RP Workstation: [id number]   CT Cervical Spine Wo Contrast Result Date: 02/10/2024 EXAM: CT HEAD AND CERVICAL SPINE 02/09/2024 11:59:46 PM TECHNIQUE: CT of the head and cervical spine was performed without the administration of intravenous contrast. Multiplanar reformatted images are provided for review. Automated exposure control, iterative reconstruction, and/or weight based adjustment of the mA/kV was utilized to reduce the radiation dose to as low as reasonably achievable. COMPARISON: None available. CLINICAL HISTORY: Head trauma, abnormal mental status (Age 42-64y). Head/neck trauma, impaired ROM, AMS. FINDINGS: CT HEAD BRAIN AND VENTRICLES: No acute intracranial hemorrhage. No mass effect or midline shift. No abnormal extra-axial fluid collection. No evidence of acute infarct. No hydrocephalus. ORBITS: No acute abnormality. SINUSES AND MASTOIDS: No acute abnormality. SOFT TISSUES AND SKULL: No acute skull fracture. No acute soft tissue abnormality. CT CERVICAL SPINE BONES AND ALIGNMENT: No acute fracture or traumatic malalignment. No listhesis. DEGENERATIVE CHANGES: Overall straightening of the cervical spine. SOFT TISSUES: No prevertebral soft tissue swelling. IMPRESSION: 1. No acute intracranial abnormality. 2. No acute fracture or traumatic malalignment of the cervical spine. 3. Straightening of the cervical spine, which may reflect muscle spasm or positioning. Electronically signed by: Dorethia Molt MD 02/10/2024 12:10 AM EDT RP Workstation: [id number]   DG Chest Port 1 View Result Date: 02/09/2024 EXAM: 1 VIEW  XRAY OF THE CHEST 02/09/2024 10:16:51 PM COMPARISON: None available. CLINICAL HISTORY: cp. Chest pain cp. Chest pain FINDINGS: LUNGS AND PLEURA: No focal pulmonary opacity. No pulmonary edema. No pleural effusion. No pneumothorax. HEART AND MEDIASTINUM: No acute abnormality of the cardiac and mediastinal silhouettes. BONES AND SOFT TISSUES: No acute osseous abnormality. IMPRESSION: 1. No acute process. Electronically signed by: Dorethia Molt MD 02/09/2024 10:25 PM EDT RP Workstation: [id number]    Assessment & Plan:   Bipolar I disorder, single manic episode, severe with psychotic features (HCC)- Doing well on the current doses. Will continue. -     Divalproex  Sodium; Take 2 tablets (1,000 mg total) by mouth 2 (two) times daily.  Dispense: 360 tablet; Refill: 1 -     Haloperidol ; Take 1 tablet (10 mg total) by mouth at bedtime.  Dispense: 90 tablet; Refill: 1 -     traZODone  HCl; Take 1 tablet (50 mg total) by mouth at bedtime.  Dispense: 90 tablet; Refill: 1  Schizophrenia, paranoid type (HCC) -     Divalproex  Sodium; Take 2 tablets (1,000 mg total) by mouth 2 (two) times daily.  Dispense: 360 tablet; Refill: 1 -     Haloperidol ; Take 1 tablet (10 mg total) by mouth at bedtime.  Dispense: 90 tablet; Refill: 1 -     traZODone  HCl; Take 1 tablet (50 mg total) by mouth at bedtime.  Dispense: 90 tablet; Refill: 1  Substance induced mood disorder (HCC) -     Divalproex  Sodium; Take 2 tablets (1,000 mg total) by mouth 2 (two) times daily.  Dispense: 360 tablet; Refill: 1 -     Haloperidol ; Take 1 tablet (10 mg total) by mouth at bedtime.  Dispense: 90 tablet; Refill: 1 -     traZODone  HCl; Take 1 tablet (50 mg total) by mouth at bedtime.  Dispense: 90 tablet; Refill: 1     Follow-up: No follow-ups on file.  Debby Molt, MD

## 2024-03-09 ENCOUNTER — Other Ambulatory Visit (HOSPITAL_COMMUNITY): Payer: Self-pay | Admitting: Psychiatry

## 2024-03-16 ENCOUNTER — Other Ambulatory Visit: Payer: Self-pay | Admitting: Internal Medicine

## 2024-03-16 NOTE — Telephone Encounter (Unsigned)
 Copied from CRM #[id number]. Topic: Clinical - Medication Refill >> Mar 16, 2024 10:57 AM Deaijah H wrote: Medication: gabapentin  (NEURONTIN ) 100 MG capsule  Has the patient contacted their pharmacy? Yes (Agent: If no, request that the patient contact the pharmacy for the refill. If patient does not wish to contact the pharmacy document the reason why and proceed with request.) (Agent: If yes, when and what did the pharmacy advise?)  This is the patient's preferred pharmacy:  [facility] [address], [name] - [address] [place] [address] [name] [address] Phone: [phone] Fax: [phone]  Is this the correct pharmacy for this prescription? Yes If no, delete pharmacy and type the correct one.   Has the prescription been filled recently? No  Is the patient out of the medication? Yes  Has the patient been seen for an appointment in the last year OR does the patient have an upcoming appointment? Yes  Can we respond through MyChart? Yes  Agent: Please be advised that Rx refills may take up to 3 business days. We ask that you follow-up with your pharmacy.

## 2024-03-17 MED ORDER — GABAPENTIN 100 MG PO CAPS: 200.0000 mg | ORAL_CAPSULE | Freq: Three times a day (TID) | ORAL | 1 refills | Status: AC

## 2024-03-29 ENCOUNTER — Inpatient Hospital Stay (HOSPITAL_COMMUNITY)
Admission: EM | Admit: 2024-03-29 | Discharge: 2024-04-03 | DRG: 395 | Disposition: A | Discharge status: Home / Self Care | Payer: Self-pay | Attending: Internal Medicine | Admitting: Internal Medicine

## 2024-03-29 ENCOUNTER — Other Ambulatory Visit: Payer: Self-pay

## 2024-03-29 ENCOUNTER — Emergency Department (HOSPITAL_COMMUNITY): Payer: Self-pay

## 2024-03-29 DIAGNOSIS — K3580 Unspecified acute appendicitis: Principal | ICD-10-CM | POA: Diagnosis present

## 2024-03-29 DIAGNOSIS — F4310 Post-traumatic stress disorder, unspecified: Secondary | ICD-10-CM | POA: Diagnosis present

## 2024-03-29 DIAGNOSIS — F302 Manic episode, severe with psychotic symptoms: Secondary | ICD-10-CM | POA: Diagnosis present

## 2024-03-29 DIAGNOSIS — F5104 Psychophysiologic insomnia: Secondary | ICD-10-CM | POA: Diagnosis present

## 2024-03-29 DIAGNOSIS — D751 Secondary polycythemia: Secondary | ICD-10-CM | POA: Diagnosis present

## 2024-03-29 DIAGNOSIS — Z720 Tobacco use: Secondary | ICD-10-CM

## 2024-03-29 DIAGNOSIS — Z79899 Other long term (current) drug therapy: Secondary | ICD-10-CM

## 2024-03-29 DIAGNOSIS — F419 Anxiety disorder, unspecified: Secondary | ICD-10-CM | POA: Diagnosis present

## 2024-03-29 DIAGNOSIS — Z888 Allergy status to other drugs, medicaments and biological substances status: Secondary | ICD-10-CM

## 2024-03-29 DIAGNOSIS — F250 Schizoaffective disorder, bipolar type: Secondary | ICD-10-CM | POA: Diagnosis present

## 2024-03-29 DIAGNOSIS — K37 Unspecified appendicitis: Principal | ICD-10-CM

## 2024-03-29 LAB — CBC
HCT: 50.2 % (ref 39.0–52.0)
Hemoglobin: 17.4 g/dL — ABNORMAL HIGH (ref 13.0–17.0)
MCH: 30.8 pg (ref 26.0–34.0)
MCHC: 34.7 g/dL (ref 30.0–36.0)
MCV: 88.8 fL (ref 80.0–100.0)
Platelets: 304 K/uL (ref 150–400)
RBC: 5.65 MIL/uL (ref 4.22–5.81)
RDW: 12.1 % (ref 11.5–15.5)
WBC: 6.9 K/uL (ref 4.0–10.5)
nRBC: 0 % (ref 0.0–0.2)

## 2024-03-29 LAB — COMPREHENSIVE METABOLIC PANEL WITH GFR
ALT: 13 U/L (ref 0–44)
AST: 19 U/L (ref 15–41)
Albumin: 5.1 g/dL — ABNORMAL HIGH (ref 3.5–5.0)
Alkaline Phosphatase: 59 U/L (ref 38–126)
Anion gap: 16 — ABNORMAL HIGH (ref 5–15)
BUN: 11 mg/dL (ref 6–20)
CO2: 25 mmol/L (ref 22–32)
Calcium: 10.5 mg/dL — ABNORMAL HIGH (ref 8.9–10.3)
Chloride: 96 mmol/L — ABNORMAL LOW (ref 98–111)
Creatinine, Ser: 0.93 mg/dL (ref 0.61–1.24)
GFR, Estimated: >60 mL/min (ref >60)
Glucose, Bld: 137 mg/dL — ABNORMAL HIGH (ref 70–99)
Potassium: 3.7 mmol/L (ref 3.5–5.1)
Sodium: 137 mmol/L (ref 135–145)
Total Bilirubin: 0.5 mg/dL (ref 0.0–1.2)
Total Protein: 9.1 g/dL — ABNORMAL HIGH (ref 6.5–8.1)

## 2024-03-29 LAB — URINALYSIS, ROUTINE W REFLEX MICROSCOPIC
Bilirubin Urine: NEGATIVE
Glucose, UA: NEGATIVE mg/dL
Hgb urine dipstick: NEGATIVE
Ketones, ur: 5 mg/dL — AB
Leukocytes,Ua: NEGATIVE
Nitrite: NEGATIVE
Protein, ur: 100 mg/dL — AB
Specific Gravity, Urine: 1.033 — ABNORMAL HIGH (ref 1.005–1.030)
pH: 5 (ref 5.0–8.0)

## 2024-03-29 LAB — LIPASE, BLOOD: Lipase: 17 U/L (ref 11–51)

## 2024-03-29 MED ORDER — METRONIDAZOLE 500 MG/100ML IV SOLN
500.0000 mg | Freq: Once | INTRAVENOUS | Status: AC
  Administered 2024-03-29: 20:00:00 500 mg via INTRAVENOUS
  Filled 2024-03-29: qty 100

## 2024-03-29 MED ORDER — HYDROMORPHONE HCL 1 MG/ML IJ SOLN
1.0000 mg | Freq: Once | INTRAMUSCULAR | Status: AC
  Administered 2024-03-29: 20:00:00 1 mg via INTRAVENOUS
  Filled 2024-03-29: qty 1

## 2024-03-29 MED ORDER — METRONIDAZOLE 500 MG/100ML IV SOLN
500.0000 mg | Freq: Two times a day (BID) | INTRAVENOUS | Status: DC
  Administered 2024-03-30 – 2024-04-03 (×9): 500 mg via INTRAVENOUS
  Filled 2024-03-29 (×9): qty 100

## 2024-03-29 MED ORDER — IOHEXOL 300 MG/ML  SOLN
100.0000 mL | Freq: Once | INTRAMUSCULAR | Status: AC | PRN
  Administered 2024-03-29: 18:00:00 100 mL via INTRAVENOUS

## 2024-03-29 MED ORDER — ONDANSETRON HCL 4 MG/2ML IJ SOLN
4.0000 mg | Freq: Once | INTRAMUSCULAR | Status: AC
  Administered 2024-03-29: 17:00:00 4 mg via INTRAVENOUS
  Filled 2024-03-29: qty 2

## 2024-03-29 MED ORDER — SODIUM CHLORIDE 0.9 % IV SOLN
2.0000 g | INTRAVENOUS | Status: DC
  Administered 2024-03-30 – 2024-04-02 (×4): 2 g via INTRAVENOUS
  Filled 2024-03-29 (×5): qty 20

## 2024-03-29 MED ORDER — LACTATED RINGERS IV BOLUS
1000.0000 mL | Freq: Once | INTRAVENOUS | Status: AC
  Administered 2024-03-29: 17:00:00 1000 mL via INTRAVENOUS

## 2024-03-29 MED ORDER — DICYCLOMINE HCL 10 MG/ML IM SOLN
20.0000 mg | Freq: Once | INTRAMUSCULAR | Status: AC
  Administered 2024-03-29: 17:00:00 20 mg via INTRAMUSCULAR
  Filled 2024-03-29: qty 2

## 2024-03-29 MED ORDER — SODIUM CHLORIDE 0.9 % IV SOLN
2.0000 g | Freq: Once | INTRAVENOUS | Status: AC
  Administered 2024-03-29: 20:00:00 2 g via INTRAVENOUS
  Filled 2024-03-29: qty 20

## 2024-03-29 NOTE — Consult Note (Signed)
 Ronald [name] [facility] 1981-02-04  [id number].    Requesting MD: Duwaine Mayans, PA-C Chief Complaint/Reason for Consult: appendicitis  HPI:  Ronald [name] is a 43 yo male who presented to the ED with abdominal pain. He began having RLQ pain one week ago, and has also had nausea and vomiting. He is afebrile in the ED and WBC is normal. A CT scan showed acute appendicitis without abscess or free air. General surgery was consulted.  He was admitted in late October of this year after a syncopal event and fall. He had a cardiac workup which did not show any cardiac causes for his symptoms. He had a CTA during that admission which showed increased size of the appendix to 8.41mm compared to a previous can, but there were no secondary inflammatory changes and he clinically did not have concern for appendicitis.  He has not had any prior abdominal surgeries.  ROS: Review of Systems  Constitutional:  Negative for chills and fever.  Respiratory:  Negative for shortness of breath.   Gastrointestinal:  Positive for abdominal pain, nausea and vomiting.    No family history on file.  Past Medical History:  Diagnosis Date   Anxiety    Arthritis    Disorder of pineal gland    Post traumatic stress disorder    Schizoaffective disorder, bipolar type (HCC) 08/29/2023   Severe manic bipolar 1 disorder with psychotic behavior (HCC) 08/29/2023    Past Surgical History:  Procedure Laterality Date   ANKLE SURGERY      Social History:  reports that he has never smoked. His smokeless tobacco use includes chew. He reports current drug use. Drug: Marijuana. He reports that he does not drink alcohol.  Allergies: Allergies[1]  (Not in a hospital admission)    Physical Exam: Blood pressure 135/87, pulse 60, temperature 98.6 F (37 C), resp. rate 18, height 5' 11 (1.803 m), weight 79.4 kg, SpO2 99%. General: resting comfortably, appears stated age, no apparent distress Neurological: alert and oriented,  no focal deficits HEENT: normocephalic, atraumatic CV: regular rate and rhythm Respiratory: normal work of breathing on room air Abdomen: soft, nondistended, focally tender to palpation in the RLQ. No surgical scars noted. Extremities: warm and well-perfused, no deformities, moving all extremities spontaneously Psychiatric: mildly anxious Skin: warm and dry   Results for orders placed or performed during the hospital encounter of 03/29/24 (from the past 48 hours)  Lipase, blood     Status: None   Collection Time: 03/29/24  1:09 PM  Result Value Ref Range   Lipase 17 11 - 51 U/L    Comment: Performed at [facility], [address] [place], [name] [address]  Comprehensive metabolic panel     Status: Abnormal   Collection Time: 03/29/24  1:09 PM  Result Value Ref Range   Sodium 137 135 - 145 mmol/L   Potassium 3.7 3.5 - 5.1 mmol/L   Chloride 96 (L) 98 - 111 mmol/L   CO2 25 22 - 32 mmol/L   Glucose, Bld 137 (H) 70 - 99 mg/dL    Comment: Glucose reference range applies only to samples taken after fasting for at least 8 hours.   BUN 11 6 - 20 mg/dL   Creatinine, Ser 9.06 0.61 - 1.24 mg/dL   Calcium 89.4 (H) 8.9 - 10.3 mg/dL   Total Protein 9.1 (H) 6.5 - 8.1 g/dL   Albumin 5.1 (H) 3.5 - 5.0 g/dL   AST 19 15 - 41 U/L  ALT 13 0 - 44 U/L   Alkaline Phosphatase 59 38 - 126 U/L   Total Bilirubin 0.5 0.0 - 1.2 mg/dL   GFR, Estimated >39 >39 mL/min    Comment: (NOTE) Calculated using the CKD-EPI Creatinine Equation (2021)    Anion gap 16 (H) 5 - 15    Comment: Performed at [facility], [address] [place], [name] [address]  CBC     Status: Abnormal   Collection Time: 03/29/24  1:09 PM  Result Value Ref Range   WBC 6.9 4.0 - 10.5 K/uL   RBC 5.65 4.22 - 5.81 MIL/uL   Hemoglobin 17.4 (H) 13.0 - 17.0 g/dL   HCT 49.7 60.9 - 47.9 %   MCV 88.8 80.0 - 100.0 fL   MCH 30.8 26.0 - 34.0 pg   MCHC 34.7 30.0 - 36.0 g/dL   RDW 87.8 88.4 - 84.4 %    Platelets 304 150 - 400 K/uL   nRBC 0.0 0.0 - 0.2 %    Comment: Performed at [facility], [address], [place], [name] [address]  Urinalysis, Routine w reflex microscopic -Urine, Clean Catch     Status: Abnormal   Collection Time: 03/29/24  1:22 PM  Result Value Ref Range   Color, Urine AMBER (A) YELLOW    Comment: BIOCHEMICALS MAY BE AFFECTED BY COLOR   APPearance HAZY (A) CLEAR   Specific Gravity, Urine 1.033 (H) 1.005 - 1.030   pH 5.0 5.0 - 8.0   Glucose, UA NEGATIVE NEGATIVE mg/dL   Hgb urine dipstick NEGATIVE NEGATIVE   Bilirubin Urine NEGATIVE NEGATIVE   Ketones, ur 5 (A) NEGATIVE mg/dL   Protein, ur 899 (A) NEGATIVE mg/dL   Nitrite NEGATIVE NEGATIVE   Leukocytes,Ua NEGATIVE NEGATIVE   RBC / HPF 0-5 0 - 5 RBC/hpf   WBC, UA 0-5 0 - 5 WBC/hpf   Bacteria, UA RARE (A) NONE SEEN   Squamous Epithelial / HPF 0-5 0 - 5 /HPF   Mucus PRESENT    Ca Oxalate Crys, UA PRESENT     Comment: Performed at [facility], [address], [place], [name] [address]   CT ABDOMEN PELVIS W CONTRAST Result Date: 03/29/2024 EXAM: CT ABDOMEN AND PELVIS WITH CONTRAST 03/29/2024 05:48:36 PM TECHNIQUE: CT of the abdomen and pelvis was performed with the administration of 100 mL of iohexol  (OMNIPAQUE ) 300 MG/ML solution. Multiplanar reformatted images are provided for review. Automated exposure control, iterative reconstruction, and/or weight-based adjustment of the mA/kV was utilized to reduce the radiation dose to as low as reasonably achievable. COMPARISON: 02/09/2024. CLINICAL HISTORY: RLQ abdominal pain; possible appendicitis. FINDINGS: LOWER CHEST: Bilateral lower lobe atelectasis. LIVER: The liver is unremarkable. GALLBLADDER AND BILE DUCTS: Gallbladder is unremarkable. No biliary ductal dilatation. SPLEEN: No acute abnormality. PANCREAS: No acute abnormality. ADRENAL GLANDS: No acute abnormality. KIDNEYS, URETERS AND BLADDER: No stones in the kidneys or ureters.  No hydronephrosis. No perinephric or periureteral stranding. The bladder is mildly thick-walled but underdistended. GI AND BOWEL: Stomach demonstrates no acute abnormality. Abnormal appendix, measuring up to 16 mm with periappendiceal stranding (image 95), reflecting acute appendicitis. Suspected mild secondary inflammatory changes involving the adjacent terminal ileum. There is no bowel obstruction. PERITONEUM AND RETROPERITONEUM: No drainable fluid collection/abscess. No free air. VASCULATURE: Aorta is normal in caliber. LYMPH NODES: No lymphadenopathy. REPRODUCTIVE ORGANS: The prostate is unremarkable. BONES AND SOFT TISSUES: Mild degenerative changes of the lumbar spine. No acute osseous abnormality. No focal soft tissue abnormality. IMPRESSION:  1. Acute appendicitis. No drainable fluid collection or free air. 2. Suspected mild secondary inflammatory changes involving the adjacent terminal ileum. Electronically signed by: Pinkie Pebbles MD 03/29/2024 06:34 PM EST RP Workstation: [id number]      Assessment/Plan 43 yo male presenting with RLQ abdominal pain, nausea and vomiting for the past 7 days. I personally reviewed his labs, imaging, and notes. CT scan shows significant dilation of the appendix with extensive surrounding stranding, and secondary inflammation of the terminal ileum. There is no abscess or fluid collection to suggest perforation, but given the duration of symptoms and extent of inflammatory changes in the RLQ, his appendix may be adherent to the TI. I would recommend a trial of nonoperative management with IV antibiotics. If he does not improve quickly with antibiotics, then will need to proceed with an appendectomy. I reviewed this plan with the patient and his mother at bedside. - Ok for full liquids tonight, keep NPO after midnight - Pain and nausea control - Continue IV antibiotics (ceftriaxone  and flagyl ) - Patient has been admitted to medicine. Surgery will follow  closely.   Ronald Dawn, MD [facility] Surgery General, Hepatobiliary and Pancreatic Surgery 03/29/2024 8:08 PM      [1]  Allergies Allergen Reactions   Vistaril  [Hydroxyzine ] Itching, Anxiety and Other (See Comments)    Hyperactivity    Alupent [Metaproterenol] Other (See Comments)    Hyperactivity   Klonopin  [Clonazepam ] Other (See Comments)    Patient feels Klonopin  is not effective for him, he feels Ativan  works much better for him.   Seroquel  [Quetiapine ] Other (See Comments)    Hallucinations    Zyprexa  [Olanzapine ] Other (See Comments)    made patient feel weird   Antihistamines, Chlorpheniramine-Type Anxiety and Other (See Comments)    made patient feel insane - all antihistamines   Antihistamines, Diphenhydramine -Type Anxiety and Other (See Comments)    made patient feel insane - all antihistamines   Antihistamines, Loratadine-Type Anxiety and Other (See Comments)    made patient feel insane - all antihistamines

## 2024-03-29 NOTE — ED Provider Notes (Signed)
 [place] EMERGENCY DEPARTMENT AT [facility] Provider Note   CSN: [id number] Arrival date & time: 03/29/24  1144     Patient presents with: Abdominal Pain   Ronald [name] is a 43 y.o. male with a history of schizoaffective disorder, manic bipolar disorder with psychotic behavior, presents to the ED with abdominal pain that began last Monday. The patient states that the abdominal pain is in his lower right quadrant.  Patient states that he has had nausea and vomiting associated with the pain for the last week and over the last couple days has had a hard time keeping anything down. The patient reports no constipation or diarrhea.  The patient endorses subjective fevers/chills at home for the past week.  Patient states that he was recently hospitalized in October and found to have acute changes to his appendix indicating early appendicitis, at that time it was decided that patient would follow-up outpatient with no surgical necessity.  Patient presents with mother who is a good historian.  Patient's mother is concerned that it could possibly be his appendix.  No recent travel. No sick contacts.  The patient is in no acute distress.  {Add pertinent medical, surgical, social history, OB history to HPI:32947}  Abdominal Pain      Prior to Admission medications  Medication Sig Start Date End Date Taking? Authorizing Provider  divalproex  (DEPAKOTE ) 500 MG DR tablet Take 2 tablets (1,000 mg total) by mouth 2 (two) times daily. 03/04/24   Joshua Debby CROME, MD  gabapentin  (NEURONTIN ) 100 MG capsule Take 2 capsules (200 mg total) by mouth 3 (three) times daily. 03/17/24   Joshua Debby CROME, MD  haloperidol  (HALDOL ) 10 MG tablet Take 1 tablet (10 mg total) by mouth at bedtime. 03/04/24   Joshua Debby CROME, MD  ibuprofen  (ADVIL ) 200 MG tablet Take 400 mg by mouth 2 (two) times daily as needed for headache (pain).    [provider]  Multiple Vitamins-Minerals (MENS MULTIVITAMIN)  TABS Take 1 tablet by mouth daily.    [provider]  traZODone  (DESYREL ) 50 MG tablet Take 1 tablet (50 mg total) by mouth at bedtime. 03/04/24   Joshua Debby CROME, MD    Allergies: Vistaril  [hydroxyzine ]; Alupent [metaproterenol]; Klonopin  [clonazepam ]; Seroquel  Eliot.eddy ]; Zyprexa  Emilian.enter ]; Antihistamines, chlorpheniramine-type; Antihistamines, diphenhydramine -type; and Antihistamines, loratadine-type    Review of Systems  Gastrointestinal:  Positive for abdominal pain.    Updated Vital Signs BP (!) 168/106 (BP Location: Left Arm)   Pulse 99   Temp 98.6 F (37 C) (Oral)   Resp 18   Ht 5' 11 (1.803 m)   Wt 79.4 kg   SpO2 100%   BMI 24.41 kg/m   Physical Exam Vitals and nursing note reviewed.  Constitutional:      General: He is not in acute distress.    Appearance: Normal appearance.  HENT:     Head: Normocephalic and atraumatic.  Eyes:     Extraocular Movements: Extraocular movements intact.     Conjunctiva/sclera: Conjunctivae normal.     Pupils: Pupils are equal, round, and reactive to light.  Cardiovascular:     Rate and Rhythm: Normal rate and regular rhythm.     Pulses: Normal pulses.  Pulmonary:     Effort: Pulmonary effort is normal. No respiratory distress.  Abdominal:     General: Abdomen is flat.     Palpations: Abdomen is soft.     Tenderness: There is abdominal tenderness. Positive signs include McBurney's sign.  Comments: Significant right lower quadrant pain on palpation.  Mild rebound tenderness.  No distention or guarding.  No hernias appreciated on exam.  Musculoskeletal:        General: Normal range of motion.     Cervical back: Normal range of motion.  Skin:    General: Skin is warm and dry.     Capillary Refill: Capillary refill takes less than 2 seconds.  Neurological:     General: No focal deficit present.     Mental Status: He is alert. Mental status is at baseline.  Psychiatric:        Mood and Affect: Mood normal.      (all labs ordered are listed, but only abnormal results are displayed) Labs Reviewed  COMPREHENSIVE METABOLIC PANEL WITH GFR - Abnormal; Notable for the following components:      Result Value   Chloride 96 (*)    Glucose, Bld 137 (*)    Calcium 10.5 (*)    Total Protein 9.1 (*)    Albumin 5.1 (*)    Anion gap 16 (*)    All other components within normal limits  CBC - Abnormal; Notable for the following components:   Hemoglobin 17.4 (*)    All other components within normal limits  URINALYSIS, ROUTINE W REFLEX MICROSCOPIC - Abnormal; Notable for the following components:   Color, Urine AMBER (*)    APPearance HAZY (*)    Specific Gravity, Urine 1.033 (*)    Ketones, ur 5 (*)    Protein, ur 100 (*)    Bacteria, UA RARE (*)    All other components within normal limits  LIPASE, BLOOD    EKG: None  Radiology: No results found.  {Document cardiac monitor, telemetry assessment procedure when appropriate:32947} Procedures   Medications Ordered in the ED - No data to display  Clinical Course as of 03/29/24 1834  Mon Mar 29, 2024  1629 Temp: 98.6 F (37 C) Afebrile, vital stable, patient in no acute distress [ML]  1629 Urinalysis, Routine w reflex microscopic -Urine, Clean Catch(!) Unremarkable [ML]  1629 CBC(!) No acute findings [ML]  1629 Comprehensive metabolic panel(!) Elevated anion, anion gap elevated [ML]  1630 Comprehensive metabolic panel(!) [ML]  1630 Lipase, blood WNL [ML]  1644 To trial Bentyl , Zofran , and lactated ringer  bolus for symptomatic relief [ML]  1820 Temp: 100.1 F (37.8 C) [ML]    Clinical Course User Index [ML] Willma Duwaine CROME, PA   {Click here for ABCD2, HEART and other calculators REFRESH Note before signing:1}                              Medical Decision Making Amount and/or Complexity of Data Reviewed Labs: ordered. Decision-making details documented in ED Course.   Patient presents to the ED for: Right lower quadrant pain   This involves an extensive number of treatment options and is a complaint that carries with it a high risk of complications  Differential diagnosis includes: Infectious etiology Co-morbid conditions: ***  Additional history/records obtained and reviewed: Additional history obtained from  {history 1:34122} External records from outside source obtained and reviewed including ***\  Clinical Course as of 03/29/24 1834  Mon Mar 29, 2024  1629 Temp: 98.6 F (37 C) Afebrile, vital stable, patient in no acute distress [ML]  1629 Urinalysis, Routine w reflex microscopic -Urine, Clean Catch(!) Unremarkable [ML]  1629 CBC(!) No acute findings [ML]  1629 Comprehensive metabolic panel(!) Elevated  anion, anion gap elevated [ML]  1630 Comprehensive metabolic panel(!) [ML]  1630 Lipase, blood WNL [ML]  1644 To trial Bentyl , Zofran , and lactated ringer  bolus for symptomatic relief [ML]  1820 Temp: 100.1 F (37.8 C) [ML]    Clinical Course User Index [ML] Willma Duwaine CROME, PA    Data Reviewed / Actions Taken: Labs ordered/reviewed with my independent interpretation in ED course above. Imaging ordered/reviewed with my independent interpretation in ED course above. I agree with the radiologists interpretation.  EKG ordered/reviewed with my independent interpretation in ED course above. The patient *** continuous cardiac monitoring during the ED stay. **** Key findings for the patient were reviewed with the attending physician, and ongoing clinical collaboration was maintained throughout the visit  Management / Treatments: See ED course above for medications, treatments administered, and clinical rationale.   Reevaluation of the patient after these medicines showed that the patient resolved/improved/worsened:23923::improved} I have reviewed the patients home medicines and have made adjustments as needed  Test Considered/Diagnostic tools:  Clinical decision tools such as MDCalc were used in the  emergency department to support diagnostic accuracy, risk stratification, and disposition planning: *** Additional diagnostic testing was {add test 1:34123} based on the patients presenting symptoms, risk factors, and initial clinical assessment. ***  ED Course / Reassessments: Problem List:  *** presented for ***. Initial assessment included history, physical exam, and review of prior medical records. Patients physical exam included revealed ***. Laboratory studies, imaging, and other ancillary studies were obtained and key results included ***. Management included ***, with ongoing reassessment. Pain and symptoms were addressed during the visit. Vital signs were obtained and monitored, and the patient remained stable throughout the stay. The patient showed ***  Patient response: *** Serial reassessments performed: {yes/no:20286}    Social determinants impacting care: {social izu:65875}  Consultations:  {consult:34125} Consult recommendations incorporated into plan: ***  Disposition: Disposition: {dispo:34126} Rationale for disposition: *** The disposition plan and rationale were discussed with the patient at the bedside, all questions were addressed, and the patient demonstrated understanding.  This note was produced using Electronics Engineer. While I have reviewed and verified all clinical information, transcription errors may remain.   {Document critical care time when appropriate  Document review of labs and clinical decision tools ie CHADS2VASC2, etc  Document your independent review of radiology images and any outside records  Document your discussion with family members, caretakers and with consultants  Document social determinants of health affecting pt's care  Document your decision making why or why not admission, treatments were needed:32947:::1}   Final diagnoses:  None    ED Discharge Orders     None

## 2024-03-29 NOTE — ED Provider Notes (Incomplete)
  EMERGENCY DEPARTMENT AT [facility] Provider Note   CSN: [id number] Arrival date & time: 03/29/24  1144     Patient presents with: Abdominal Pain   Ronald [name] is a 43 y.o. male with a history of schizoaffective disorder, manic bipolar disorder with psychotic behavior, presents to the ED with abdominal pain that began last Monday. The patient states that the abdominal pain is in his lower right quadrant.  Patient states that he has had nausea and vomiting associated with the pain for the last week and over the last couple days has had a hard time keeping anything down. The patient reports no constipation or diarrhea.  The patient endorses subjective fevers/chills at home for the past week.  Patient states that he was recently hospitalized in October and found to have acute changes to his appendix indicating early appendicitis, at that time it was decided that patient would follow-up outpatient with no surgical necessity.  Patient presents with mother who is a good historian.  Patient's mother is concerned that it could possibly be his appendix.  No recent travel. No sick contacts.  The patient is in no acute distress.  {Add pertinent medical, surgical, social history, OB history to HPI:32947}  Abdominal Pain      Prior to Admission medications  Medication Sig Start Date End Date Taking? Authorizing Provider  divalproex  (DEPAKOTE ) 500 MG DR tablet Take 2 tablets (1,000 mg total) by mouth 2 (two) times daily. 03/04/24   Joshua Debby CROME, MD  gabapentin  (NEURONTIN ) 100 MG capsule Take 2 capsules (200 mg total) by mouth 3 (three) times daily. 03/17/24   Joshua Debby CROME, MD  haloperidol  (HALDOL ) 10 MG tablet Take 1 tablet (10 mg total) by mouth at bedtime. 03/04/24   Joshua Debby CROME, MD  ibuprofen  (ADVIL ) 200 MG tablet Take 400 mg by mouth 2 (two) times daily as needed for headache (pain).    [provider]  Multiple Vitamins-Minerals (MENS MULTIVITAMIN)  TABS Take 1 tablet by mouth daily.    [provider]  traZODone  (DESYREL ) 50 MG tablet Take 1 tablet (50 mg total) by mouth at bedtime. 03/04/24   Joshua Debby CROME, MD    Allergies: Vistaril  [hydroxyzine ]; Alupent [metaproterenol]; Klonopin  [clonazepam ]; Seroquel  [quetiapine ]; Zyprexa  [olanzapine ]; Antihistamines, chlorpheniramine-type; Antihistamines, diphenhydramine -type; and Antihistamines, loratadine-type    Review of Systems  Gastrointestinal:  Positive for abdominal pain.    Updated Vital Signs BP (!) 168/106 (BP Location: Left Arm)   Pulse 99   Temp 98.6 F (37 C) (Oral)   Resp 18   Ht 5' 11 (1.803 m)   Wt 79.4 kg   SpO2 100%   BMI 24.41 kg/m   Physical Exam Vitals and nursing note reviewed.  Constitutional:      General: He is not in acute distress.    Appearance: Normal appearance.  HENT:     Head: Normocephalic and atraumatic.  Eyes:     Extraocular Movements: Extraocular movements intact.     Conjunctiva/sclera: Conjunctivae normal.     Pupils: Pupils are equal, round, and reactive to light.  Cardiovascular:     Rate and Rhythm: Normal rate and regular rhythm.     Pulses: Normal pulses.  Pulmonary:     Effort: Pulmonary effort is normal. No respiratory distress.  Abdominal:     General: Abdomen is flat.     Palpations: Abdomen is soft.     Tenderness: There is abdominal tenderness. Positive signs include McBurney's sign.  Comments: Significant right lower quadrant pain on palpation.  Mild rebound tenderness.  No distention or guarding.  No hernias appreciated on exam.  Musculoskeletal:        General: Normal range of motion.     Cervical back: Normal range of motion.  Skin:    General: Skin is warm and dry.     Capillary Refill: Capillary refill takes less than 2 seconds.  Neurological:     General: No focal deficit present.     Mental Status: He is alert. Mental status is at baseline.  Psychiatric:        Mood and Affect: Mood normal.      (all labs ordered are listed, but only abnormal results are displayed) Labs Reviewed  COMPREHENSIVE METABOLIC PANEL WITH GFR - Abnormal; Notable for the following components:      Result Value   Chloride 96 (*)    Glucose, Bld 137 (*)    Calcium 10.5 (*)    Total Protein 9.1 (*)    Albumin 5.1 (*)    Anion gap 16 (*)    All other components within normal limits  CBC - Abnormal; Notable for the following components:   Hemoglobin 17.4 (*)    All other components within normal limits  URINALYSIS, ROUTINE W REFLEX MICROSCOPIC - Abnormal; Notable for the following components:   Color, Urine AMBER (*)    APPearance HAZY (*)    Specific Gravity, Urine 1.033 (*)    Ketones, ur 5 (*)    Protein, ur 100 (*)    Bacteria, UA RARE (*)    All other components within normal limits  LIPASE, BLOOD    EKG: None  Radiology: No results found.  {Document cardiac monitor, telemetry assessment procedure when appropriate:32947} Procedures   Medications Ordered in the ED - No data to display  Clinical Course as of 03/29/24 2325  Mon Mar 29, 2024  1629 Temp: 98.6 F (37 C) Afebrile, vital stable, patient in no acute distress [ML]  1629 Urinalysis, Routine w reflex microscopic -Urine, Clean Catch(!) Unremarkable [ML]  1629 CBC(!) No acute findings [ML]  1629 Comprehensive metabolic panel(!) Elevated anion, anion gap elevated [ML]  1630 Comprehensive metabolic panel(!) [ML]  1630 Lipase, blood WNL [ML]  1644 To trial Bentyl , Zofran , and lactated ringer  bolus for symptomatic relief [ML]  1820 Temp: 100.1 F (37.8 C) [ML]  1849 CT ABDOMEN PELVIS W CONTRAST Appendicitis  [ML]  1900 Consult surgery - will see patient [ML]  1906 Patient started on antibiotics and given hydromorphone  for pain relief [ML]    Clinical Course User Index [ML] Willma Duwaine CROME, PA   {Click here for ABCD2, HEART and other calculators REFRESH Note before signing:1}                              Medical  Decision Making Amount and/or Complexity of Data Reviewed Labs: ordered. Decision-making details documented in ED Course. Radiology: ordered. Decision-making details documented in ED Course.  Risk Prescription drug management. Decision regarding hospitalization.   Patient presents to the ED for: Right lower quadrant pain  This involves an extensive number of treatment options and is a complaint that carries with it a high risk of complications  Differential diagnosis includes: Appendicitis  Co-morbid conditions: ***  Additional history/records obtained and reviewed: Additional history obtained from  parent(s) -mother present to skin historian External records from outside source obtained and reviewed including previous CT  imaging showing possible developing appendicitis.   Clinical Course as of 03/29/24 2325  Mon Mar 29, 2024  1629 Temp: 98.6 F (37 C) Afebrile, vital stable, patient in no acute distress [ML]  1629 Urinalysis, Routine w reflex microscopic -Urine, Clean Catch(!) Unremarkable [ML]  1629 CBC(!) No acute findings [ML]  1629 Comprehensive metabolic panel(!) Elevated anion, anion gap elevated [ML]  1630 Comprehensive metabolic panel(!) [ML]  1630 Lipase, blood WNL [ML]  1644 To trial Bentyl , Zofran , and lactated ringer  bolus for symptomatic relief [ML]  1820 Temp: 100.1 F (37.8 C) [ML]  1849 CT ABDOMEN PELVIS W CONTRAST Appendicitis  [ML]  1900 Consult surgery - will see patient [ML]  1906 Patient started on antibiotics and given hydromorphone  for pain relief [ML]    Clinical Course User Index [ML] Willma Duwaine CROME, PA    Data Reviewed / Actions Taken: Labs ordered/reviewed with my independent interpretation in ED course above. Imaging ordered/reviewed with my independent interpretation in ED course above. I agree with the radiologists interpretation.   Management / Treatments: See ED course above for medications, treatments administered, and clinical  rationale.   Reevaluation of the patient after these medicines showed that the patient improved with pain management. I have reviewed the patients home medicines and have made adjustments as needed  ED Course / Reassessments: Problem List: Appendicitis 43 year old male presented for right lower quadrant pain. Initial assessment included history, physical exam, and review of prior medical records.  Given physical exam findings CT imaging was completed.  CT imaging showed appendicitis which correlated with patient's pain.  Patient's pain was managed during this visit.  Patient was started on antibiotics and general surgery was consulted.  The patient was to be admitted for further evaluation and care.  patient response: improved with ED treatment. Serial reassessments performed: Yes    Consultations:  General Surgery -Leonor Dawn, MD Consult recommendations incorporated into plan: General Surgery to follow patient for appendicitis Hospitalist - Terry Hurst, DO Consult recommendations incorporated into plan: admission for further evaluation and care.  Disposition: Disposition: Admission for further evaluation and care of appendicitis Rationale for disposition: Appendicitis The disposition plan and rationale were discussed with the patient at the bedside, all questions were addressed, and the patient demonstrated understanding.  This note was produced using Electronics Engineer. While I have reviewed and verified all clinical information, transcription errors may remain.   {Document critical care time when appropriate  Document review of labs and clinical decision tools ie CHADS2VASC2, etc  Document your independent review of radiology images and any outside records  Document your discussion with family members, caretakers and with consultants  Document social determinants of health affecting pt's care  Document your decision making why or why not admission, treatments were  needed:32947:::1}   Final diagnoses:  None    ED Discharge Orders     None

## 2024-03-29 NOTE — ED Triage Notes (Signed)
 Pt reports right lower abd pain and nv since last Monday. No abd surgeries. F/u with primary recently to r/o appendicitis. No otc meds.

## 2024-03-30 DIAGNOSIS — K3580 Unspecified acute appendicitis: Secondary | ICD-10-CM | POA: Diagnosis present

## 2024-03-30 LAB — CBC
HCT: 37 % — ABNORMAL LOW (ref 39.0–52.0)
Hemoglobin: 13.1 g/dL (ref 13.0–17.0)
MCH: 31.5 pg (ref 26.0–34.0)
MCHC: 35.4 g/dL (ref 30.0–36.0)
MCV: 88.9 fL (ref 80.0–100.0)
Platelets: 236 K/uL (ref 150–400)
RBC: 4.16 MIL/uL — ABNORMAL LOW (ref 4.22–5.81)
RDW: 12.2 % (ref 11.5–15.5)
WBC: 6.7 K/uL (ref 4.0–10.5)
nRBC: 0 % (ref 0.0–0.2)

## 2024-03-30 LAB — MAGNESIUM: Magnesium: 2.1 mg/dL (ref 1.7–2.4)

## 2024-03-30 LAB — PHOSPHORUS: Phosphorus: 3.8 mg/dL (ref 2.5–4.6)

## 2024-03-30 LAB — COMPREHENSIVE METABOLIC PANEL WITH GFR
ALT: 8 U/L (ref 0–44)
AST: 15 U/L (ref 15–41)
Albumin: 3.6 g/dL (ref 3.5–5.0)
Alkaline Phosphatase: 39 U/L (ref 38–126)
Anion gap: 8 (ref 5–15)
BUN: 10 mg/dL (ref 6–20)
CO2: 29 mmol/L (ref 22–32)
Calcium: 9 mg/dL (ref 8.9–10.3)
Chloride: 101 mmol/L (ref 98–111)
Creatinine, Ser: 0.97 mg/dL (ref 0.61–1.24)
GFR, Estimated: >60 mL/min (ref >60)
Glucose, Bld: 98 mg/dL (ref 70–99)
Potassium: 4.4 mmol/L (ref 3.5–5.1)
Sodium: 137 mmol/L (ref 135–145)
Total Bilirubin: 0.3 mg/dL (ref 0.0–1.2)
Total Protein: 6 g/dL — ABNORMAL LOW (ref 6.5–8.1)

## 2024-03-30 MED ORDER — LACTATED RINGERS IV SOLN: INTRAVENOUS | Status: AC

## 2024-03-30 MED ORDER — CLONAZEPAM 0.5 MG PO TABS
0.5000 mg | ORAL_TABLET | Freq: Two times a day (BID) | ORAL | Status: DC | PRN
  Administered 2024-03-30 – 2024-04-03 (×8): 0.5 mg via ORAL
  Filled 2024-03-30 (×9): qty 1

## 2024-03-30 MED ORDER — MELATONIN 5 MG PO TABS: 5.0000 mg | ORAL_TABLET | Freq: Every evening | ORAL | Status: DC | PRN

## 2024-03-30 MED ORDER — OXYCODONE HCL 5 MG PO TABS
5.0000 mg | ORAL_TABLET | ORAL | Status: AC | PRN
  Administered 2024-03-30: 17:00:00 10 mg via ORAL
  Administered 2024-03-30: 22:00:00 5 mg via ORAL
  Administered 2024-03-31 – 2024-04-01 (×4): 10 mg via ORAL
  Filled 2024-03-30 (×6): qty 2

## 2024-03-30 MED ORDER — ACETAMINOPHEN 325 MG PO TABS
650.0000 mg | ORAL_TABLET | Freq: Four times a day (QID) | ORAL | Status: DC | PRN
  Administered 2024-04-02: 650 mg via ORAL
  Filled 2024-03-30: qty 2

## 2024-03-30 MED ORDER — PROCHLORPERAZINE EDISYLATE 10 MG/2ML IJ SOLN
10.0000 mg | Freq: Four times a day (QID) | INTRAMUSCULAR | Status: DC | PRN
  Administered 2024-03-31 – 2024-04-03 (×7): 10 mg via INTRAVENOUS
  Filled 2024-03-30 (×7): qty 2

## 2024-03-30 MED ORDER — GABAPENTIN 100 MG PO CAPS
200.0000 mg | ORAL_CAPSULE | Freq: Three times a day (TID) | ORAL | Status: DC
  Administered 2024-03-30 – 2024-04-03 (×14): 200 mg via ORAL
  Filled 2024-03-30 (×14): qty 2

## 2024-03-30 MED ORDER — MAGNESIUM GLUCONATE 500 (27 MG) MG PO TABS
500.0000 mg | ORAL_TABLET | ORAL | Status: AC
  Administered 2024-03-30: 01:00:00 500 mg via ORAL
  Filled 2024-03-30: qty 1

## 2024-03-30 MED ORDER — TRAZODONE HCL 50 MG PO TABS
50.0000 mg | ORAL_TABLET | Freq: Every day | ORAL | Status: DC
  Administered 2024-03-30 – 2024-04-02 (×5): 50 mg via ORAL
  Filled 2024-03-30 (×5): qty 1

## 2024-03-30 MED ORDER — MELATONIN 5 MG PO TABS
5.0000 mg | ORAL_TABLET | ORAL | Status: AC
  Administered 2024-03-30: 01:00:00 5 mg via ORAL
  Filled 2024-03-30: qty 1

## 2024-03-30 MED ORDER — DIVALPROEX SODIUM 250 MG PO DR TAB
1000.0000 mg | DELAYED_RELEASE_TABLET | Freq: Two times a day (BID) | ORAL | Status: DC
  Administered 2024-03-30 – 2024-04-03 (×9): 1000 mg via ORAL
  Filled 2024-03-30 (×9): qty 4

## 2024-03-30 MED ORDER — CLONAZEPAM 1 MG PO TABS
1.0000 mg | ORAL_TABLET | ORAL | Status: AC
  Administered 2024-03-30: 03:00:00 1 mg via ORAL
  Filled 2024-03-30: qty 1

## 2024-03-30 MED ORDER — HYDROMORPHONE HCL 1 MG/ML IJ SOLN
0.5000 mg | INTRAMUSCULAR | Status: DC | PRN
  Administered 2024-03-30 – 2024-04-01 (×8): 1 mg via INTRAVENOUS
  Filled 2024-03-30 (×9): qty 1

## 2024-03-30 MED ORDER — HYDROMORPHONE HCL 1 MG/ML IJ SOLN: 1.0000 mg | INTRAMUSCULAR | Status: DC

## 2024-03-30 MED ORDER — POLYETHYLENE GLYCOL 3350 17 G PO PACK: 17.0000 g | PACK | Freq: Every day | ORAL | Status: DC | PRN

## 2024-03-30 MED ORDER — PROCHLORPERAZINE EDISYLATE 10 MG/2ML IJ SOLN
5.0000 mg | Freq: Four times a day (QID) | INTRAMUSCULAR | Status: DC | PRN
  Administered 2024-03-30: 02:00:00 5 mg via INTRAVENOUS
  Filled 2024-03-30: qty 2

## 2024-03-30 MED ORDER — HYDROMORPHONE HCL 1 MG/ML IJ SOLN
0.5000 mg | INTRAMUSCULAR | Status: DC | PRN
  Administered 2024-03-30: 09:00:00 0.5 mg via INTRAVENOUS
  Filled 2024-03-30: qty 0.5

## 2024-03-30 NOTE — Progress Notes (Signed)
°   03/30/24 0915  TOC Brief Assessment  Insurance and Status Lapsed  Patient has primary care physician Yes  Home environment has been reviewed single family home  Prior level of function: independent  Prior/Current Home Services No current home services  Social Drivers of Health Review SDOH reviewed no interventions necessary  Readmission risk has been reviewed Yes  Transition of care needs no transition of care needs at this time    Signed: Heather Saltness, MSW, LCSW Clinical Social Worker Inpatient Care Management 03/30/2024 9:15 AM

## 2024-03-30 NOTE — Hospital Course (Addendum)
 Care started prior to midnight in the emergency room and patient was admitted overnight early this morning by Dr. Terry Hurst and I am in agreement with her assessment and plan.  Additional changes to the plan of care been made accordingly.  The patient is a 43 year old Caucasian male with a past medical history significant for but not limited to PTSD, chronic anxiety, history of benzodiazepine dependence with prior history of early appendicitis in October 2025 who presented to the ED with intermittent right lower quadrant abdominal pain associate with nausea, vomiting, diarrhea.    Symptoms progressively worsened and had subjective fevers and chills and came to the ED for further evaluation where CT Abd/Pelvis w/ Contrast done revealed acute appendicitis with no drainable fluid collection noted or free air.  There was also secondary inflammatory changes involving the adjacent terminal ileum.  He was initiated on IV antibiotics with ceftriaxone , Flagyl  and given a 1 L IV fluid bolus and surgery was consulted.  Patient was made n.p.o. at midnight for possible surgical intervention but now Surgery is recommending trial of conservative management and have advanced diet to CLD.   Assessment and Plan:  Acute appendicitis, POA: First noted on CT scan done in October 27, 202. Presented with right lower quadrant abdominal pain, associated with nausea vomiting and diarrhea. CT scan revealed acute appendicitis and the appendix may be adherent to the terminal ileum.  Placed on IV ceftriaxone  and IV Flagyl . General Surgery consulted and recommending a trial of nonoperative management with IV antibiotics and if he does not improve quickly with antibiotics recommending proceeding with an appendectomy. Monitor fever curve and WBCs.  Will need pain and nausea control with analgesics and antiemetics. IV fluid hydration.   -Further General Surgery evaluation and Recc's: Trial of Nonoperative management given that he is at  increased risk of need for open operation and ileocolectomy.  If he improves with antibiotics then can outpatient setting with interval appendectomy in a few weeks but if fails conservative management then they are going to recommend repeat CT scan and possible discussion of OR pending the CT findings.  Diet has been advanced to clear liquids.  Will continue mobilization. WBC went from 17.4 -> 13.1   Bipolar Disorder / PTSD / Chronic Anxiety Disorder: Resume home regimen   Chronic Insomnia: Resume home regimen   Mild Hypercalcemia / Mild Erythrocytosis Serum calcium 10.5 Hemoglobin 17.4 Continue IV fluid hydration Repeat chemistry panel and CBC in the morning.

## 2024-03-30 NOTE — Plan of Care (Signed)
°  Problem: Education: Goal: Knowledge of General Education information will improve Description: Including pain rating scale, medication(s)/side effects and non-pharmacologic comfort measures 03/30/2024 0712 by Arnell Ronalee Scheunemann K, RN Outcome: Progressing 03/30/2024 0712 by Arnell Kristi Hyer K, RN Outcome: Progressing   Problem: Health Behavior/Discharge Planning: Goal: Ability to manage health-related needs will improve 03/30/2024 0712 by Arnell, Evelyn Moch K, RN Outcome: Progressing 03/30/2024 0712 by Arnell Hind Chesler K, RN Outcome: Progressing   Problem: Clinical Measurements: Goal: Ability to maintain clinical measurements within normal limits will improve 03/30/2024 0712 by Arnell, Jaylee Lantry K, RN Outcome: Progressing 03/30/2024 0712 by Arnell Madalen Gavin K, RN Outcome: Progressing Goal: Will remain free from infection 03/30/2024 9287 by Arnell, Peja Allender K, RN Outcome: Progressing 03/30/2024 0712 by Arnell Fantasha Daniele K, RN Outcome: Progressing Goal: Diagnostic test results will improve 03/30/2024 0712 by Arnell Crayton POUR, RN Outcome: Progressing 03/30/2024 0712 by Arnell Kabir Brannock K, RN Outcome: Progressing Goal: Respiratory complications will improve 03/30/2024 0712 by Arnell Crayton POUR, RN Outcome: Progressing 03/30/2024 0712 by Arnell Meena Barrantes K, RN Outcome: Progressing Goal: Cardiovascular complication will be avoided 03/30/2024 9287 by Arnell Crayton POUR, RN Outcome: Progressing 03/30/2024 0712 by Arnell Avyanna Spada K, RN Outcome: Progressing   Problem: Activity: Goal: Risk for activity intolerance will decrease 03/30/2024 9287 by Arnell Crayton K, RN Outcome: Progressing 03/30/2024 0712 by Arnell Corydon Schweiss K, RN Outcome: Progressing   Problem: Nutrition: Goal: Adequate nutrition will be maintained 03/30/2024 0712 by Arnell, Edna Grover K, RN Outcome: Progressing 03/30/2024 0712 by Arnell Shaleena Crusoe K, RN Outcome: Progressing   Problem: Coping: Goal: Level of anxiety will decrease 03/30/2024 0712 by Arnell,  Humberto Addo K, RN Outcome: Progressing 03/30/2024 0712 by Arnell Caya Soberanis K, RN Outcome: Progressing   Problem: Elimination: Goal: Will not experience complications related to bowel motility 03/30/2024 0712 by Arnell, Kynzee Devinney K, RN Outcome: Progressing 03/30/2024 0712 by Arnell Chavonne Sforza K, RN Outcome: Progressing Goal: Will not experience complications related to urinary retention 03/30/2024 0712 by Arnell, Emmauel Hallums K, RN Outcome: Progressing 03/30/2024 0712 by Arnell Medardo Hassing K, RN Outcome: Progressing   Problem: Pain Managment: Goal: General experience of comfort will improve and/or be controlled 03/30/2024 9287 by Arnell, Saharra Santo K, RN Outcome: Progressing 03/30/2024 0712 by Arnell Loise Esguerra K, RN Outcome: Progressing   Problem: Safety: Goal: Ability to remain free from injury will improve 03/30/2024 0712 by Arnell, Reality Dejonge K, RN Outcome: Progressing 03/30/2024 0712 by Arnell Linnie Delgrande K, RN Outcome: Progressing   Problem: Skin Integrity: Goal: Risk for impaired skin integrity will decrease 03/30/2024 0712 by Arnell Dewain Platz K, RN Outcome: Progressing 03/30/2024 0712 by Arnell Antoine Fiallos K, RN Outcome: Progressing

## 2024-03-30 NOTE — Plan of Care (Signed)

## 2024-03-30 NOTE — Progress Notes (Signed)
 Progress Note     Subjective: Patient reports RLQ abdominal pain is about the same as yesterday. Passing some flatus but did have an episode of nausea and vomiting this AM. Feels like he needs to eat though.   Objective: Vital signs in last 24 hours: Temp:  [97.7 F (36.5 C)-100.1 F (37.8 C)] 97.7 F (36.5 C) (12/16 1118) Pulse Rate:  [55-99] 59 (12/16 1118) Resp:  [16-19] 19 (12/16 1118) BP: (105-168)/(55-106) 121/79 (12/16 1118) SpO2:  [97 %-100 %] 100 % (12/16 0627) Weight:  [79.4 kg] 79.4 kg (12/15 1213) Last BM Date : 03/29/24  Intake/Output from previous day: 12/15 0701 - 12/16 0700 In: 1250 [P.O.:50; IV Piggyback:1200] Out: -  Intake/Output this shift: No intake/output data recorded.  PE: General: pleasant, WD, WN male who is laying in bed in NAD Heart: regular, rate, and rhythm.   Lungs: Respiratory effort nonlabored Abd: soft, focally ttp in RLQ without peritonitis, ND Psych: A&Ox3 with an appropriate affect.    Lab Results:  Recent Labs    03/29/24 1309 03/30/24 0536  WBC 6.9 6.7  HGB 17.4* 13.1  HCT 50.2 37.0*  PLT 304 236   BMET Recent Labs    03/29/24 1309 03/30/24 0536  NA 137 137  K 3.7 4.4  CL 96* 101  CO2 25 29  GLUCOSE 137* 98  BUN 11 10  CREATININE 0.93 0.97  CALCIUM 10.5* 9.0   PT/INR No results for input(s): LABPROT, INR in the last 72 hours. CMP     Component Value Date/Time   NA 137 03/30/2024 0536   K 4.4 03/30/2024 0536   CL 101 03/30/2024 0536   CO2 29 03/30/2024 0536   GLUCOSE 98 03/30/2024 0536   BUN 10 03/30/2024 0536   CREATININE 0.97 03/30/2024 0536   CALCIUM 9.0 03/30/2024 0536   PROT 6.0 (L) 03/30/2024 0536   ALBUMIN 3.6 03/30/2024 0536   AST 15 03/30/2024 0536   ALT 8 03/30/2024 0536   ALKPHOS 39 03/30/2024 0536   BILITOT 0.3 03/30/2024 0536   GFRNONAA >60 03/30/2024 0536   GFRAA >60 11/21/2018 1618   Lipase     Component Value Date/Time   LIPASE 17 03/29/2024 1309        Studies/Results: CT ABDOMEN PELVIS W CONTRAST Result Date: 03/29/2024 EXAM: CT ABDOMEN AND PELVIS WITH CONTRAST 03/29/2024 05:48:36 PM TECHNIQUE: CT of the abdomen and pelvis was performed with the administration of 100 mL of iohexol  (OMNIPAQUE ) 300 MG/ML solution. Multiplanar reformatted images are provided for review. Automated exposure control, iterative reconstruction, and/or weight-based adjustment of the mA/kV was utilized to reduce the radiation dose to as low as reasonably achievable. COMPARISON: 02/09/2024. CLINICAL HISTORY: RLQ abdominal pain; possible appendicitis. FINDINGS: LOWER CHEST: Bilateral lower lobe atelectasis. LIVER: The liver is unremarkable. GALLBLADDER AND BILE DUCTS: Gallbladder is unremarkable. No biliary ductal dilatation. SPLEEN: No acute abnormality. PANCREAS: No acute abnormality. ADRENAL GLANDS: No acute abnormality. KIDNEYS, URETERS AND BLADDER: No stones in the kidneys or ureters. No hydronephrosis. No perinephric or periureteral stranding. The bladder is mildly thick-walled but underdistended. GI AND BOWEL: Stomach demonstrates no acute abnormality. Abnormal appendix, measuring up to 16 mm with periappendiceal stranding (image 95), reflecting acute appendicitis. Suspected mild secondary inflammatory changes involving the adjacent terminal ileum. There is no bowel obstruction. PERITONEUM AND RETROPERITONEUM: No drainable fluid collection/abscess. No free air. VASCULATURE: Aorta is normal in caliber. LYMPH NODES: No lymphadenopathy. REPRODUCTIVE ORGANS: The prostate is unremarkable. BONES AND SOFT TISSUES: Mild degenerative changes of the  lumbar spine. No acute osseous abnormality. No focal soft tissue abnormality. IMPRESSION: 1. Acute appendicitis. No drainable fluid collection or free air. 2. Suspected mild secondary inflammatory changes involving the adjacent terminal ileum. Electronically signed by: Pinkie Pebbles MD 03/29/2024 06:34 PM EST RP Workstation:  [id number]    Anti-infectives: Anti-infectives (From admission, onward)    Start     Dose/Rate Route Frequency Ordered Stop   03/30/24 2000  cefTRIAXone  (ROCEPHIN ) 2 g in sodium chloride  0.9 % 100 mL IVPB        2 g 200 mL/hr over 30 Minutes Intravenous Every 24 hours 03/29/24 2008     03/30/24 0800  metroNIDAZOLE  (FLAGYL ) IVPB 500 mg        500 mg 100 mL/hr over 60 Minutes Intravenous Every 12 hours 03/29/24 2008     03/29/24 1915  cefTRIAXone  (ROCEPHIN ) 2 g in sodium chloride  0.9 % 100 mL IVPB       Placed in And Linked Group   2 g 200 mL/hr over 30 Minutes Intravenous  Once 03/29/24 1905 03/29/24 2020   03/29/24 1915  metroNIDAZOLE  (FLAGYL ) IVPB 500 mg       Placed in And Linked Group   500 mg 100 mL/hr over 60 Minutes Intravenous  Once 03/29/24 1905 03/29/24 2100        Assessment/Plan  Acute appendicitis - CT with above, no abscess or free air, inflammatory changes of terminal ileum, no obstruction, no appendicolith - pt with symptoms for >1 week - no leukocytosis, afebrile and HD stable - recommend trial of non-operative management given increased risk of need for open operation and or ileocecectomy. If pt improves with abx then can plan outpatient follow up and interval appendectomy in a few weeks. Discussed with patient at bedside who is in agreement - if pt does not improve with conservative management then would recommend repeat CT and discussion of possible OR pending CT findings - ok to advance to CLD today - AM labs - mobilize as able  FEN: CLD, IVF per [facility] VTE: ok to have SQH or LMWH from surgical standpoint ID: [email or web address] /[email or web address]   - per [facility] -  Schizoaffective disorder,  Bipolar I Disorder Anxiety PTSD    LOS: 0 days   I reviewed hospitalist notes, last 24 h vitals and pain scores, last 48 h intake and output, last 24 h labs and trends, and last 24 h imaging results.  This care required moderate level of medical decision making.    Burnard JONELLE Louder, [facility] Surgery 03/30/2024, 11:31 AM Please see Amion for pager number during day hours 7:00am-4:30pm

## 2024-03-30 NOTE — H&P (Signed)
 History and Physical  Ronald [name] FMW:[id number] DOB: Jan 15, 1981 DOA: 03/29/2024  Referring physician: Willma Bouchard, PA-EDP  PCP: Joshua Debby CROME, MD  Outpatient Specialists: Psychiatry Patient coming from: Home.  Chief Complaint: Right lower quadrant abdominal pain.  HPI: Ronald [name] is a 43 y.o. male with medical history significant for PTSD, chronic anxiety and benzodiazepine dependence, prior history of early appendicitis in October 2025, who presents to the ER due to right lower quadrant abdominal pain.  Associated with nausea, vomiting, and diarrhea, intermittently for the past few days.  Today, symptoms worsened.  Admits to subjective fevers and chills.  The patient presented to the ER for further evaluation.    In the ER, a CT abdomen pelvis with contrast revealed acute appendicitis.  No drainage fluid collection or free air noted.  Suspect mild secondary inflammatory changes involving the adjacent terminal ileum.  The patient received empiric IV antibiotics, Rocephin , IV Flagyl , and IV fluid bolus LR 1 L x 1.  EDP discussed the case with general surgery.  They will see the patient.  Recommended admission by hospitalist service, [facility].  N.p.o. after midnight.  ED Course: Temperature 98.5.  BP 120/85, pulse 55, respiratory rate 16, O2 saturation 99% on room air.  Review of Systems: Review of systems as noted in the HPI. All other systems reviewed and are negative.   Past Medical History:  Diagnosis Date   Anxiety    Arthritis    Disorder of pineal gland    Post traumatic stress disorder    Schizoaffective disorder, bipolar type (HCC) 08/29/2023   Severe manic bipolar 1 disorder with psychotic behavior (HCC) 08/29/2023   Past Surgical History:  Procedure Laterality Date   ANKLE SURGERY      Social History:  reports that he has never smoked. His smokeless tobacco use includes chew. He reports current drug use. Drug: Marijuana. He reports that he does not  drink alcohol.   Allergies[1]  Family history: None reported.  Prior to Admission medications  Medication Sig Start Date End Date Taking? Authorizing Provider  divalproex  (DEPAKOTE ) 500 MG DR tablet Take 2 tablets (1,000 mg total) by mouth 2 (two) times daily. Patient taking differently: Take 1,000 mg by mouth See admin instructions. Take 1,000 mg by mouth at 8 AM and 5 PM 03/04/24  Yes Joshua Debby CROME, MD  gabapentin  (NEURONTIN ) 100 MG capsule Take 2 capsules (200 mg total) by mouth 3 (three) times daily. Patient taking differently: Take 200 mg by mouth See admin instructions. Take 200 mg by mouth in the morning, at midday, and 5 PM 03/17/24  Yes Joshua Debby CROME, MD  ibuprofen  (ADVIL ) 200 MG tablet Take 800 mg by mouth daily as needed (for pain or headaches).   Yes [provider]  Multiple Vitamins-Minerals (MENS MULTIVITAMIN) TABS Take 1 tablet by mouth every 7 (seven) days.   Yes [provider]  traZODone  (DESYREL ) 50 MG tablet Take 1 tablet (50 mg total) by mouth at bedtime. 03/04/24  Yes Joshua Debby CROME, MD  haloperidol  (HALDOL ) 10 MG tablet Take 1 tablet (10 mg total) by mouth at bedtime. Patient not taking: Reported on 03/29/2024 03/04/24   Joshua Debby CROME, MD    Physical Exam: BP 120/85 (BP Location: Right Arm)   Pulse (!) 55   Temp 98.5 F (36.9 C)   Resp 16   Ht 5' 11 (1.803 m)   Wt 79.4 kg   SpO2 99%   BMI 24.41 kg/m   General: 42  y.o. year-old male well developed well nourished in no acute distress.  Alert and oriented x3. Cardiovascular: Regular rate and rhythm with no rubs or gallops.  No thyromegaly or JVD noted.  No lower extremity edema. 2/4 pulses in all 4 extremities. Respiratory: Clear to auscultation with no wheezes or rales. Good inspiratory effort. Abdomen: Soft, right lower quadrant tenderness, nondistended with normal bowel sounds x4 quadrants. Muskuloskeletal: No cyanosis, clubbing or edema noted bilaterally Neuro: CN II-XII intact,  strength, sensation, reflexes Skin: No ulcerative lesions noted or rashes Psychiatry: Judgement and insight appear normal. Mood is appropriate for condition and setting          Labs on Admission:  Basic Metabolic Panel: Recent Labs  Lab 03/29/24 1309  NA 137  K 3.7  CL 96*  CO2 25  GLUCOSE 137*  BUN 11  CREATININE 0.93  CALCIUM 10.5*   Liver Function Tests: Recent Labs  Lab 03/29/24 1309  AST 19  ALT 13  ALKPHOS 59  BILITOT 0.5  PROT 9.1*  ALBUMIN 5.1*   Recent Labs  Lab 03/29/24 1309  LIPASE 17   No results for input(s): AMMONIA in the last 168 hours. CBC: Recent Labs  Lab 03/29/24 1309  WBC 6.9  HGB 17.4*  HCT 50.2  MCV 88.8  PLT 304   Cardiac Enzymes: No results for input(s): CKTOTAL, CKMB, CKMBINDEX, TROPONINI in the last 168 hours.  BNP (last 3 results) No results for input(s): BNP in the last 8760 hours.  ProBNP (last 3 results) No results for input(s): PROBNP in the last 8760 hours.  CBG: No results for input(s): GLUCAP in the last 168 hours.  Radiological Exams on Admission: CT ABDOMEN PELVIS W CONTRAST Result Date: 03/29/2024 EXAM: CT ABDOMEN AND PELVIS WITH CONTRAST 03/29/2024 05:48:36 PM TECHNIQUE: CT of the abdomen and pelvis was performed with the administration of 100 mL of iohexol  (OMNIPAQUE ) 300 MG/ML solution. Multiplanar reformatted images are provided for review. Automated exposure control, iterative reconstruction, and/or weight-based adjustment of the mA/kV was utilized to reduce the radiation dose to as low as reasonably achievable. COMPARISON: 02/09/2024. CLINICAL HISTORY: RLQ abdominal pain; possible appendicitis. FINDINGS: LOWER CHEST: Bilateral lower lobe atelectasis. LIVER: The liver is unremarkable. GALLBLADDER AND BILE DUCTS: Gallbladder is unremarkable. No biliary ductal dilatation. SPLEEN: No acute abnormality. PANCREAS: No acute abnormality. ADRENAL GLANDS: No acute abnormality. KIDNEYS, URETERS AND  BLADDER: No stones in the kidneys or ureters. No hydronephrosis. No perinephric or periureteral stranding. The bladder is mildly thick-walled but underdistended. GI AND BOWEL: Stomach demonstrates no acute abnormality. Abnormal appendix, measuring up to 16 mm with periappendiceal stranding (image 95), reflecting acute appendicitis. Suspected mild secondary inflammatory changes involving the adjacent terminal ileum. There is no bowel obstruction. PERITONEUM AND RETROPERITONEUM: No drainable fluid collection/abscess. No free air. VASCULATURE: Aorta is normal in caliber. LYMPH NODES: No lymphadenopathy. REPRODUCTIVE ORGANS: The prostate is unremarkable. BONES AND SOFT TISSUES: Mild degenerative changes of the lumbar spine. No acute osseous abnormality. No focal soft tissue abnormality. IMPRESSION: 1. Acute appendicitis. No drainable fluid collection or free air. 2. Suspected mild secondary inflammatory changes involving the adjacent terminal ileum. Electronically signed by: Pinkie Pebbles MD 03/29/2024 06:34 PM EST RP Workstation: [id number]    EKG: I independently viewed the EKG done and my findings are as followed: None available at the time of this visit.  Assessment/Plan Present on Admission:  Acute appendicitis  Principal Problem:   Acute appendicitis  Acute appendicitis, POA First noted on CT scan done in February 09, 2024 Presented with right lower quadrant abdominal pain, associated with nausea vomiting and diarrhea. CT scan revealed acute appendicitis Continue Rocephin  and IV Flagyl  Monitor fever curve and WBCs Pain control as needed IV antiemetic as needed IV fluid hydration General Surgery consulted N.p.o. after midnight, until seen by general surgery.  Bipolar disorder PTSD Chronic anxiety disorder Resume home regimen  Chronic insomnia Resume home regimen  Mild hypercalcemia Mild erythrocytosis Serum calcium 10.5 Hemoglobin 17.4 Continue IV fluid hydration Repeat  chemistry panel and CBC in the morning.   Time: 75 minutes.   DVT prophylaxis: Full code.  Code Status: Full code.  Family Communication: None at bedside.  Disposition Plan: Admitted to MedSurg unit.  Consults called: General Surgery.  Admission status: Observation status   Status is: Observation Observation status   Terry LOISE Hurst MD [facility] Pager [phone]  If 7PM-7AM, please contact night-coverage [email or web address] Password [email or web address]  03/30/2024, 2:40 AM      [1]  Allergies Allergen Reactions   Vistaril  [Hydroxyzine ] Itching, Anxiety and Other (See Comments)    Hyperactivity    Alupent [Metaproterenol] Other (See Comments)    Hyperactivity   Seroquel  [Quetiapine ] Other (See Comments)    Hallucinations    Zyprexa  [Olanzapine ] Other (See Comments)    made patient feel weird   Antihistamines, Chlorpheniramine-Type Anxiety and Other (See Comments)    made patient feel insane - all antihistamines   Antihistamines, Diphenhydramine -Type Anxiety and Other (See Comments)    made patient feel insane - all antihistamines   Antihistamines, Loratadine-Type Anxiety and Other (See Comments)    made patient feel insane - all antihistamines   Clonidine Derivatives Anxiety and Other (See Comments)    Caused a red/flushed face and had a panic attack

## 2024-03-30 NOTE — Progress Notes (Addendum)
 PROGRESS NOTE    Ronald [name]  FMW:[id number] DOB: 01/05/1981 DOA: 03/29/2024 PCP: Joshua Debby CROME, MD   Brief Narrative:  Care started prior to midnight in the emergency room and patient was admitted overnight early this morning by Dr. Terry Hurst and I am in agreement with her assessment and plan.  Additional changes to the plan of care been made accordingly.  The patient is a 43 year old Caucasian male with a past medical history significant for but not limited to PTSD, chronic anxiety, history of benzodiazepine dependence with prior history of early appendicitis in October 2025 who presented to the ED with intermittent right lower quadrant abdominal pain associate with nausea, vomiting, diarrhea.    Symptoms progressively worsened and had subjective fevers and chills and came to the ED for further evaluation where CT Abd/Pelvis w/ Contrast done revealed acute appendicitis with no drainable fluid collection noted or free air.  There was also secondary inflammatory changes involving the adjacent terminal ileum.  He was initiated on IV antibiotics with ceftriaxone , Flagyl  and given a 1 L IV fluid bolus and surgery was consulted.  Patient was made n.p.o. at midnight for possible surgical intervention but now Surgery is recommending trial of conservative management and have advanced diet to CLD.   Assessment and Plan:  Acute appendicitis, POA: First noted on CT scan done in October 27, 202. Presented with right lower quadrant abdominal pain, associated with nausea vomiting and diarrhea. CT scan revealed acute appendicitis and the appendix may be adherent to the terminal ileum.  Placed on IV ceftriaxone  and IV Flagyl . General Surgery consulted and recommending a trial of nonoperative management with IV antibiotics and if he does not improve quickly with antibiotics recommending proceeding with an appendectomy. Monitor fever curve and WBCs.  Will need pain and nausea control with analgesics and  antiemetics. IV fluid hydration.   -Further General Surgery evaluation and Recc's: Trial of Nonoperative management given that he is at increased risk of need for open operation and ileocolectomy.  If he improves with antibiotics then can outpatient setting with interval appendectomy in a few weeks but if fails conservative management then they are going to recommend repeat CT scan and possible discussion of OR pending the CT findings.  Diet has been advanced to clear liquids.  Will continue mobilization. WBC went from 17.4 -> 13.1   Bipolar Disorder / PTSD / Chronic Anxiety Disorder: Resume home regimen   Chronic Insomnia: Resume home regimen   Mild Hypercalcemia / Mild Erythrocytosis Serum calcium 10.5 Hemoglobin 17.4 Continue IV fluid hydration Repeat chemistry panel and CBC in the morning.   DVT prophylaxis: SCDs Start: 03/30/24 0156    Code Status: Full Code Family Communication: No family present @ bedside  Disposition Plan:  Level of care: Med-Surg Status is: Observation The patient will require care spanning > 2 midnights and should be moved to inpatient because: May need Surgical Intervention if fails to improve   Consultants:  General Surgery  Procedures:  As delineated as above  Antimicrobials:  Anti-infectives (From admission, onward)    Start     Dose/Rate Route Frequency Ordered Stop   03/30/24 2000  cefTRIAXone  (ROCEPHIN ) 2 g in sodium chloride  0.9 % 100 mL IVPB        2 g 200 mL/hr over 30 Minutes Intravenous Every 24 hours 03/29/24 2008     03/30/24 0800  metroNIDAZOLE  (FLAGYL ) IVPB 500 mg        500 mg 100 mL/hr over 60 Minutes Intravenous  Every 12 hours 03/29/24 2008     03/29/24 1915  cefTRIAXone  (ROCEPHIN ) 2 g in sodium chloride  0.9 % 100 mL IVPB       Placed in And Linked Group   2 g 200 mL/hr over 30 Minutes Intravenous  Once 03/29/24 1905 03/29/24 2020   03/29/24 1915  metroNIDAZOLE  (FLAGYL ) IVPB 500 mg       Placed in And Linked Group   500  mg 100 mL/hr over 60 Minutes Intravenous  Once 03/29/24 1905 03/29/24 2100       Objective: Vitals:   03/29/24 2305 03/30/24 0315 03/30/24 0627 03/30/24 1118  BP: 120/85 (!) 105/55 114/79 121/79  Pulse: (!) 55 60 67 (!) 59  Resp: 16 16 16 19   Temp: 98.5 F (36.9 C) 98.1 F (36.7 C) 98 F (36.7 C) 97.7 F (36.5 C)  TempSrc:    Oral  SpO2: 99% 97% 100%   Weight:      Height:        Intake/Output Summary (Last 24 hours) at 03/30/2024 1602 Last data filed at 03/29/2024 2300 Gross per 24 hour  Intake 1250 ml  Output --  Net 1250 ml   Filed Weights   03/29/24 1213  Weight: 79.4 kg   Data Reviewed: I have personally reviewed following labs and imaging studies  CBC: Recent Labs  Lab 03/29/24 1309 03/30/24 0536  WBC 6.9 6.7  HGB 17.4* 13.1  HCT 50.2 37.0*  MCV 88.8 88.9  PLT 304 236   Basic Metabolic Panel: Recent Labs  Lab 03/29/24 1309 03/30/24 0536  NA 137 137  K 3.7 4.4  CL 96* 101  CO2 25 29  GLUCOSE 137* 98  BUN 11 10  CREATININE 0.93 0.97  CALCIUM 10.5* 9.0  MG  --  2.1  PHOS  --  3.8   GFR: Estimated Creatinine Clearance: 105.7 mL/min (by C-G formula based on SCr of 0.97 mg/dL). Liver Function Tests: Recent Labs  Lab 03/29/24 1309 03/30/24 0536  AST 19 15  ALT 13 8  ALKPHOS 59 39  BILITOT 0.5 0.3  PROT 9.1* 6.0*  ALBUMIN 5.1* 3.6   Recent Labs  Lab 03/29/24 1309  LIPASE 17   No results for input(s): AMMONIA in the last 168 hours. Coagulation Profile: No results for input(s): INR, PROTIME in the last 168 hours. Cardiac Enzymes: No results for input(s): CKTOTAL, CKMB, CKMBINDEX, TROPONINI in the last 168 hours. BNP (last 3 results) No results for input(s): PROBNP in the last 8760 hours. HbA1C: No results for input(s): HGBA1C in the last 72 hours. CBG: No results for input(s): GLUCAP in the last 168 hours. Lipid Profile: No results for input(s): CHOL, HDL, LDLCALC, TRIG, CHOLHDL, LDLDIRECT in the  last 72 hours. Thyroid  Function Tests: No results for input(s): TSH, T4TOTAL, FREET4, T3FREE, THYROIDAB in the last 72 hours. Anemia Panel: No results for input(s): VITAMINB12, FOLATE, FERRITIN, TIBC, IRON, RETICCTPCT in the last 72 hours. Sepsis Labs: No results for input(s): PROCALCITON, LATICACIDVEN in the last 168 hours.  No results found for this or any previous visit (from the past 240 hours).   Radiology Studies: CT ABDOMEN PELVIS W CONTRAST Result Date: 03/29/2024 EXAM: CT ABDOMEN AND PELVIS WITH CONTRAST 03/29/2024 05:48:36 PM TECHNIQUE: CT of the abdomen and pelvis was performed with the administration of 100 mL of iohexol  (OMNIPAQUE ) 300 MG/ML solution. Multiplanar reformatted images are provided for review. Automated exposure control, iterative reconstruction, and/or weight-based adjustment of the mA/kV was utilized to reduce the radiation  dose to as low as reasonably achievable. COMPARISON: 02/09/2024. CLINICAL HISTORY: RLQ abdominal pain; possible appendicitis. FINDINGS: LOWER CHEST: Bilateral lower lobe atelectasis. LIVER: The liver is unremarkable. GALLBLADDER AND BILE DUCTS: Gallbladder is unremarkable. No biliary ductal dilatation. SPLEEN: No acute abnormality. PANCREAS: No acute abnormality. ADRENAL GLANDS: No acute abnormality. KIDNEYS, URETERS AND BLADDER: No stones in the kidneys or ureters. No hydronephrosis. No perinephric or periureteral stranding. The bladder is mildly thick-walled but underdistended. GI AND BOWEL: Stomach demonstrates no acute abnormality. Abnormal appendix, measuring up to 16 mm with periappendiceal stranding (image 95), reflecting acute appendicitis. Suspected mild secondary inflammatory changes involving the adjacent terminal ileum. There is no bowel obstruction. PERITONEUM AND RETROPERITONEUM: No drainable fluid collection/abscess. No free air. VASCULATURE: Aorta is normal in caliber. LYMPH NODES: No lymphadenopathy. REPRODUCTIVE  ORGANS: The prostate is unremarkable. BONES AND SOFT TISSUES: Mild degenerative changes of the lumbar spine. No acute osseous abnormality. No focal soft tissue abnormality. IMPRESSION: 1. Acute appendicitis. No drainable fluid collection or free air. 2. Suspected mild secondary inflammatory changes involving the adjacent terminal ileum. Electronically signed by: Pinkie Pebbles MD 03/29/2024 06:34 PM EST RP Workstation: [id number]   Scheduled Meds:  divalproex   1,000 mg Oral BID   gabapentin   200 mg Oral TID with meals   traZODone   50 mg Oral QHS   Continuous Infusions:  cefTRIAXone  (ROCEPHIN )  IV     lactated ringers  100 mL/hr at 03/30/24 1316   metronidazole  500 mg (03/30/24 0854)    LOS: 0 days   Alejandro Marker, DO [facility] Available via Epic secure chat 7am-7pm After these hours, please refer to coverage provider listed on [email or web address] 03/30/2024, 4:02 PM

## 2024-03-31 ENCOUNTER — Telehealth: Payer: Self-pay

## 2024-03-31 DIAGNOSIS — K352 Acute appendicitis with generalized peritonitis, without perforation or abscess: Secondary | ICD-10-CM

## 2024-03-31 LAB — CBC
HCT: 44.7 % (ref 39.0–52.0)
Hemoglobin: 15 g/dL (ref 13.0–17.0)
MCH: 30.5 pg (ref 26.0–34.0)
MCHC: 33.6 g/dL (ref 30.0–36.0)
MCV: 91 fL (ref 80.0–100.0)
Platelets: 267 K/uL (ref 150–400)
RBC: 4.91 MIL/uL (ref 4.22–5.81)
RDW: 12.3 % (ref 11.5–15.5)
WBC: 5.6 K/uL (ref 4.0–10.5)
nRBC: 0 % (ref 0.0–0.2)

## 2024-03-31 MED ORDER — LACTATED RINGERS IV SOLN: INTRAVENOUS | Status: AC

## 2024-03-31 NOTE — Plan of Care (Signed)

## 2024-03-31 NOTE — Progress Notes (Signed)
 Progress Note     Subjective: Patient reports increased RLQ and suprapubic abdominal pain. Had a loose dark BM. Denies nausea or vomiting and tolerating CLD.   Objective: Vital signs in last 24 hours: Temp:  [98.2 F (36.8 C)-98.8 F (37.1 C)] 98.5 F (36.9 C) (12/17 1142) Pulse Rate:  [53-83] 83 (12/17 1142) Resp:  [15-16] 15 (12/17 1142) BP: (111-124)/(82-91) 117/82 (12/17 1142) SpO2:  [95 %-98 %] 95 % (12/17 1142) Last BM Date : 03/29/24  Intake/Output from previous day: 12/16 0701 - 12/17 0700 In: 2119.4 [I.V.:1763.9; IV Piggyback:355.5] Out: -  Intake/Output this shift: Total I/O In: 781.4 [P.O.:500; I.V.:281.4] Out: 0   PE: General: pleasant, WD, WN male who is laying in bed in NAD Heart: regular, rate, and rhythm.   Lungs: Respiratory effort nonlabored Abd: soft, increased ttp across lower abdomen but no peritonitis, ND Psych: A&Ox3 with an appropriate affect.    Lab Results:  Recent Labs    03/30/24 0536 03/31/24 0633  WBC 6.7 5.6  HGB 13.1 15.0  HCT 37.0* 44.7  PLT 236 267   BMET Recent Labs    03/29/24 1309 03/30/24 0536  NA 137 137  K 3.7 4.4  CL 96* 101  CO2 25 29  GLUCOSE 137* 98  BUN 11 10  CREATININE 0.93 0.97  CALCIUM 10.5* 9.0   PT/INR No results for input(s): LABPROT, INR in the last 72 hours. CMP     Component Value Date/Time   NA 137 03/30/2024 0536   K 4.4 03/30/2024 0536   CL 101 03/30/2024 0536   CO2 29 03/30/2024 0536   GLUCOSE 98 03/30/2024 0536   BUN 10 03/30/2024 0536   CREATININE 0.97 03/30/2024 0536   CALCIUM 9.0 03/30/2024 0536   PROT 6.0 (L) 03/30/2024 0536   ALBUMIN 3.6 03/30/2024 0536   AST 15 03/30/2024 0536   ALT 8 03/30/2024 0536   ALKPHOS 39 03/30/2024 0536   BILITOT 0.3 03/30/2024 0536   GFRNONAA >60 03/30/2024 0536   GFRAA >60 11/21/2018 1618   Lipase     Component Value Date/Time   LIPASE 17 03/29/2024 1309       Studies/Results: CT ABDOMEN PELVIS W CONTRAST Result Date:  03/29/2024 EXAM: CT ABDOMEN AND PELVIS WITH CONTRAST 03/29/2024 05:48:36 PM TECHNIQUE: CT of the abdomen and pelvis was performed with the administration of 100 mL of iohexol  (OMNIPAQUE ) 300 MG/ML solution. Multiplanar reformatted images are provided for review. Automated exposure control, iterative reconstruction, and/or weight-based adjustment of the mA/kV was utilized to reduce the radiation dose to as low as reasonably achievable. COMPARISON: 02/09/2024. CLINICAL HISTORY: RLQ abdominal pain; possible appendicitis. FINDINGS: LOWER CHEST: Bilateral lower lobe atelectasis. LIVER: The liver is unremarkable. GALLBLADDER AND BILE DUCTS: Gallbladder is unremarkable. No biliary ductal dilatation. SPLEEN: No acute abnormality. PANCREAS: No acute abnormality. ADRENAL GLANDS: No acute abnormality. KIDNEYS, URETERS AND BLADDER: No stones in the kidneys or ureters. No hydronephrosis. No perinephric or periureteral stranding. The bladder is mildly thick-walled but underdistended. GI AND BOWEL: Stomach demonstrates no acute abnormality. Abnormal appendix, measuring up to 16 mm with periappendiceal stranding (image 95), reflecting acute appendicitis. Suspected mild secondary inflammatory changes involving the adjacent terminal ileum. There is no bowel obstruction. PERITONEUM AND RETROPERITONEUM: No drainable fluid collection/abscess. No free air. VASCULATURE: Aorta is normal in caliber. LYMPH NODES: No lymphadenopathy. REPRODUCTIVE ORGANS: The prostate is unremarkable. BONES AND SOFT TISSUES: Mild degenerative changes of the lumbar spine. No acute osseous abnormality. No focal soft tissue abnormality. IMPRESSION: 1.  Acute appendicitis. No drainable fluid collection or free air. 2. Suspected mild secondary inflammatory changes involving the adjacent terminal ileum. Electronically signed by: Pinkie Pebbles MD 03/29/2024 06:34 PM EST RP Workstation: [id number]    Anti-infectives: Anti-infectives (From admission, onward)     Start     Dose/Rate Route Frequency Ordered Stop   03/30/24 2000  cefTRIAXone  (ROCEPHIN ) 2 g in sodium chloride  0.9 % 100 mL IVPB        2 g 200 mL/hr over 30 Minutes Intravenous Every 24 hours 03/29/24 2008     03/30/24 0800  metroNIDAZOLE  (FLAGYL ) IVPB 500 mg        500 mg 100 mL/hr over 60 Minutes Intravenous Every 12 hours 03/29/24 2008     03/29/24 1915  cefTRIAXone  (ROCEPHIN ) 2 g in sodium chloride  0.9 % 100 mL IVPB       Placed in And Linked Group   2 g 200 mL/hr over 30 Minutes Intravenous  Once 03/29/24 1905 03/29/24 2020   03/29/24 1915  metroNIDAZOLE  (FLAGYL ) IVPB 500 mg       Placed in And Linked Group   500 mg 100 mL/hr over 60 Minutes Intravenous  Once 03/29/24 1905 03/29/24 2100        Assessment/Plan  Acute appendicitis - CT with above, no abscess or free air, inflammatory changes of terminal ileum, no obstruction, no appendicolith - pt with symptoms for >1 week - no leukocytosis, afebrile and HD stable - recommend trial of non-operative management given increased risk of need for open operation and or ileocecectomy. If pt improves with abx then can plan outpatient follow up and interval appendectomy in a few weeks. Discussed with patient at bedside who is in agreement - if pt does not improve with conservative management then would recommend repeat CT and discussion of possible OR pending CT findings - continue CLD today  - AM labs - mobilize as able  FEN: CLD, IVF per TRH VTE: ok to have SQH or LMWH from surgical standpoint ID: [email or web address] /[email or web address]   - per [facility] -  Schizoaffective disorder,  Bipolar I Disorder Anxiety PTSD    LOS: 1 day   I reviewed hospitalist notes, last 24 h vitals and pain scores, last 48 h intake and output, last 24 h labs and trends, and last 24 h imaging results.  This care required moderate level of medical decision making.    Burnard JONELLE Louder, [facility] Surgery 03/31/2024, 12:05 PM Please see Amion for pager  number during day hours 7:00am-4:30pm

## 2024-03-31 NOTE — Assessment & Plan Note (Signed)
 No longer taking Haldol  at home Continue Depakote , gabapentin , trazodone  Started on Clonazepam  for anxiety

## 2024-03-31 NOTE — Telephone Encounter (Signed)
 Copied from CRM #[id number]. Topic: General - Other >> Mar 31, 2024  9:24 AM Wess RAMAN wrote: Reason for CRM: Patient's mother, Loreli Caldron, would like to speak with Dr. Joshua or his nurse in regards to her son being in the hospital.   Callback #: [phone]

## 2024-03-31 NOTE — Plan of Care (Signed)
  Problem: Clinical Measurements: Goal: Ability to maintain clinical measurements within normal limits will improve Outcome: Progressing Goal: Diagnostic test results will improve Outcome: Progressing   Problem: Coping: Goal: Level of anxiety will decrease Outcome: Progressing   Problem: Pain Managment: Goal: General experience of comfort will improve and/or be controlled Outcome: Progressing   Problem: Safety: Goal: Ability to remain free from injury will improve Outcome: Progressing

## 2024-03-31 NOTE — Telephone Encounter (Signed)
 Patient is currently in the hospital and his mom just wanted to make us  aware.

## 2024-03-31 NOTE — Progress Notes (Signed)
 Progress Note   Patient: Ronald [name] FMW:[id number] DOB: 12-Sep-1980 DOA: 03/29/2024     1 DOS: the patient was seen and examined on 03/31/2024   Brief hospital course: 43yo with h/o PTSD, chronic anxiety, history of benzodiazepine dependence with prior history of early appendicitis in October 2025 who presented on 12/15 with intermittent RLQ pain associated with n/v/d.  CT with acute appendicitis.  Started on ceftriaxone , Flagyl .  Surgery was consulted, recommending trial of conservative management and have advanced diet to CLD.     Assessment & Plan Acute appendicitis First noted on CT scan done on February 09, 2024; treated conservatively at that time  Presented with right lower quadrant abdominal pain, associated with nausea vomiting and diarrhea CT scan again revealed acute appendicitis and the appendix may be adherent to the terminal ileum Placed on IV ceftriaxone  and metronidazole  Surgery consulted and recommends a trial of conservative management with IV antibiotics, as he would be at risk for needing an open procedure and ileocolectomy If he improves with antibiotics, then can plan for interval appendectomy in outpatient setting in a few weeks  If he fails conservative management, he will need repeat CT scan and possible discussion of OR pending the CT findings Diet has been advanced to clear liquids Ongoing significant pain is in conflict with desire to advance diet as well as desire to avoid complicated surgery - he is aware of this complexity Bipolar I disorder, single manic episode, severe with psychotic features (HCC) PTSD (post-traumatic stress disorder) No longer taking Haldol  at home Continue Depakote , gabapentin , trazodone  Started on Clonazepam  for anxiety      Consultants: Surgery Inpatient case management  Procedures: None  Antibiotics: Ceftriaxone  12/15- Metronidazole  12/15-  30 Day Unplanned Readmission Risk Score    [name] to  Hosp-Admission (Current) from 03/29/2024 in [facility]  30 Day Unplanned Readmission Risk Score (%) 19.45 Filed at 03/31/2024 0801    This score is the patient's risk of an unplanned readmission within 30 days of being discharged (0 -100%). The score is based on dignosis, age, lab data, medications, orders, and past utilization.   Low:  0-14.9   Medium: 15-21.9   High: 22-29.9   Extreme: 30 and above           Subjective: He would like to advance his diet but is also concerned about possible need for open procedure, needing significant ongoing pain control.     Objective: Vitals:   03/31/24 0512 03/31/24 0828  BP: (!) 124/91 114/83  Pulse: (!) 53 74  Resp: 15 15  Temp: 98.3 F (36.8 C) 98.8 F (37.1 C)  SpO2:  98%    Intake/Output Summary (Last 24 hours) at 03/31/2024 1007 Last data filed at 03/31/2024 0830 Gross per 24 hour  Intake 2640.72 ml  Output 0 ml  Net 2640.72 ml   Filed Weights   03/29/24 1213  Weight: 79.4 kg    Exam:  General:  Appears calm and comfortable and is in NAD Eyes:  normal lids, iris ENT:  grossly normal hearing, lips & tongue, mmm Cardiovascular:  RRR. No LE edema.  Respiratory:   CTA bilaterally with no wheezes/rales/rhonchi.  Normal respiratory effort. Abdomen:  soft, significant TTP in LLQ and supraumbilical region Skin:  no rash or induration seen on limited exam Musculoskeletal:  grossly normal tone BUE/BLE, good ROM, no bony abnormality Psychiatric: anxious mood and affect, speech fluent and appropriate, AOx3 Neurologic:  CN 2-12 grossly  intact, moves all extremities in coordinated fashion  Data Reviewed: I have reviewed the patient's lab results since admission.  Pertinent labs for today include:   Normal CBC     Family Communication: None present      Code Status: Full Code   Disposition: Status is: Inpatient Remains inpatient appropriate because: ongoing  monitoring     Time spent: 50 minutes  Unresulted Labs (From admission, onward)    None        Author: Delon Herald, MD 03/31/2024 10:07 AM  For on call review [email or web address].

## 2024-03-31 NOTE — Assessment & Plan Note (Addendum)
 First noted on CT scan done on February 09, 2024; treated conservatively at that time  Presented with right lower quadrant abdominal pain, associated with nausea vomiting and diarrhea CT scan again revealed acute appendicitis and the appendix may be adherent to the terminal ileum Placed on IV ceftriaxone  and metronidazole  Surgery consulted and recommends a trial of conservative management with IV antibiotics, as he would be at risk for needing an open procedure and ileocolectomy If he improves with antibiotics, then can plan for interval appendectomy in outpatient setting in a few weeks  If he fails conservative management, he will need repeat CT scan and possible discussion of OR pending the CT findings Diet has been advanced to clear liquids Ongoing significant pain is in conflict with desire to advance diet as well as desire to avoid complicated surgery - he is aware of this complexity

## 2024-04-01 LAB — CBC
HCT: 40.7 % (ref 39.0–52.0)
Hemoglobin: 13.7 g/dL (ref 13.0–17.0)
MCH: 30.6 pg (ref 26.0–34.0)
MCHC: 33.7 g/dL (ref 30.0–36.0)
MCV: 90.8 fL (ref 80.0–100.0)
Platelets: 265 K/uL (ref 150–400)
RBC: 4.48 MIL/uL (ref 4.22–5.81)
RDW: 12.2 % (ref 11.5–15.5)
WBC: 4.8 K/uL (ref 4.0–10.5)
nRBC: 0 % (ref 0.0–0.2)

## 2024-04-01 LAB — BASIC METABOLIC PANEL WITH GFR
Anion gap: 8 (ref 5–15)
BUN: 8 mg/dL (ref 6–20)
CO2: 32 mmol/L (ref 22–32)
Calcium: 9.4 mg/dL (ref 8.9–10.3)
Chloride: 102 mmol/L (ref 98–111)
Creatinine, Ser: 1.12 mg/dL (ref 0.61–1.24)
GFR, Estimated: >60 mL/min (ref >60)
Glucose, Bld: 85 mg/dL (ref 70–99)
Potassium: 5.1 mmol/L (ref 3.5–5.1)
Sodium: 142 mmol/L (ref 135–145)

## 2024-04-01 MED ORDER — OXYCODONE HCL 5 MG PO TABS
5.0000 mg | ORAL_TABLET | ORAL | Status: DC | PRN
  Administered 2024-04-01 – 2024-04-03 (×8): 10 mg via ORAL
  Filled 2024-04-01 (×9): qty 2

## 2024-04-01 MED ORDER — OXYCODONE HCL 5 MG PO TABS
5.0000 mg | ORAL_TABLET | ORAL | Status: DC | PRN
  Administered 2024-04-01: 14:00:00 5 mg via ORAL
  Filled 2024-04-01: qty 1

## 2024-04-01 MED ORDER — MORPHINE SULFATE (PF) 2 MG/ML IV SOLN: 2.0000 mg | INTRAVENOUS | Status: DC | PRN

## 2024-04-01 MED ORDER — MORPHINE SULFATE (PF) 2 MG/ML IV SOLN
2.0000 mg | INTRAVENOUS | Status: DC | PRN
  Administered 2024-04-01 – 2024-04-03 (×5): 2 mg via INTRAVENOUS
  Filled 2024-04-01 (×5): qty 1

## 2024-04-01 NOTE — Assessment & Plan Note (Signed)
 No longer taking Haldol  at home Continue Depakote , gabapentin , trazodone  Started on Clonazepam  for anxiety

## 2024-04-01 NOTE — Assessment & Plan Note (Addendum)
 First noted on CT scan done on February 09, 2024; treated conservatively at that time  Presented with right lower quadrant abdominal pain, associated with nausea vomiting and diarrhea CT scan again revealed acute appendicitis and the appendix may be adherent to the terminal ileum Placed on IV ceftriaxone  and metronidazole  Surgery consulted and recommends a trial of conservative management with IV antibiotics, as he would be at risk for needing an open procedure and ileocolectomy If he improves with antibiotics, then can plan for interval appendectomy in outpatient setting in a few weeks  If he fails conservative management, he will need repeat CT scan and possible discussion of OR pending the CT findings Diet has been advanced to full liquids Ongoing significant pain is in conflict with desire to advance diet as well as desire to avoid complicated surgery - he is aware of this complexity This AM with surgery present, he reported pain and nausea; after surgery left, he asked about going home and minimized symptoms; he also reported feeling terrible with the Dilaudid  This afternoon, he is reporting more pain Will again change to CLD and increase Oxy back to 5-10 mg q4h prn with morphine  now for breakthrough pain Surgery notified, will consider repeat CT He is encouraged to ambulate more if possible

## 2024-04-01 NOTE — Plan of Care (Signed)
  Problem: Education: Goal: Knowledge of General Education information will improve Description: Including pain rating scale, medication(s)/side effects and non-pharmacologic comfort measures Outcome: Progressing   Problem: Clinical Measurements: Goal: Will remain free from infection Outcome: Progressing Goal: Diagnostic test results will improve Outcome: Progressing   Problem: Nutrition: Goal: Adequate nutrition will be maintained Outcome: Progressing   Problem: Elimination: Goal: Will not experience complications related to bowel motility Outcome: Progressing Goal: Will not experience complications related to urinary retention Outcome: Progressing   Problem: Safety: Goal: Ability to remain free from injury will improve Outcome: Progressing

## 2024-04-01 NOTE — Plan of Care (Signed)

## 2024-04-01 NOTE — Progress Notes (Signed)
 Progress Note   Patient: Ronald [name] FMW:[id number] DOB: Jan 18, 1981 DOA: 03/29/2024     2 DOS: the patient was seen and examined on 04/01/2024   Brief hospital course: 42yo with h/o PTSD, chronic anxiety, history of benzodiazepine dependence with prior history of early appendicitis in October 2025 who presented on 12/15 with intermittent RLQ pain associated with n/v/d.  CT with acute appendicitis.  Started on ceftriaxone , Flagyl .  Surgery was consulted, recommending trial of conservative management and have advanced diet to CLD.    Assessment & Plan Acute appendicitis First noted on CT scan done on February 09, 2024; treated conservatively at that time  Presented with right lower quadrant abdominal pain, associated with nausea vomiting and diarrhea CT scan again revealed acute appendicitis and the appendix may be adherent to the terminal ileum Placed on IV ceftriaxone  and metronidazole  Surgery consulted and recommends a trial of conservative management with IV antibiotics, as he would be at risk for needing an open procedure and ileocolectomy If he improves with antibiotics, then can plan for interval appendectomy in outpatient setting in a few weeks  If he fails conservative management, he will need repeat CT scan and possible discussion of OR pending the CT findings Diet has been advanced to full liquids Ongoing significant pain is in conflict with desire to advance diet as well as desire to avoid complicated surgery - he is aware of this complexity This AM with surgery present, he reported pain and nausea; after surgery left, he asked about going home and minimized symptoms; he also reported feeling terrible with the Dilaudid  This afternoon, he is reporting more pain Will again change to CLD and increase Oxy back to 5-10 mg q4h prn with morphine  now for breakthrough pain Surgery notified, will consider repeat CT He is encouraged to ambulate more if possible Bipolar I disorder,  single manic episode, severe with psychotic features (HCC) PTSD (post-traumatic stress disorder) No longer taking Haldol  at home Continue Depakote , gabapentin , trazodone  Started on Clonazepam  for anxiety      Consultants: Surgery Inpatient case management   Procedures: None   Antibiotics: Ceftriaxone  12/15- Metronidazole  12/15-    30 Day Unplanned Readmission Risk Score    [name] to Hosp-Admission (Current) from 03/29/2024 in [facility]  30 Day Unplanned Readmission Risk Score (%) 19.48 Filed at 04/01/2024 0400    This score is the patient's risk of an unplanned readmission within 30 days of being discharged (0 -100%). The score is based on dignosis, age, lab data, medications, orders, and past utilization.   Low:  0-14.9   Medium: 15-21.9   High: 22-29.9   Extreme: 30 and above           Subjective: Waxing and waning pain/nausea in conflict with his panic associated with being in the hospital and strong desire for discharge in conflict with his desire to avoid a complicated surgery.     Objective: Vitals:   03/31/24 1947 04/01/24 0419  BP: 123/79 115/79  Pulse: 61 64  Resp: 18 18  Temp: 98 F (36.7 C) 98.1 F (36.7 C)  SpO2: 100% 100%    Intake/Output Summary (Last 24 hours) at 04/01/2024 0801 Last data filed at 03/31/2024 1838 Gross per 24 hour  Intake 1281.37 ml  Output 0 ml  Net 1281.37 ml   Filed Weights   03/29/24 1213  Weight: 79.4 kg    Exam:  General:  Appears calm and comfortable and is in NAD  Eyes:  normal lids, iris ENT:  grossly normal hearing, lips & tongue, mmm Cardiovascular:  RRR. No LE edema.  Respiratory:   CTA bilaterally with no wheezes/rales/rhonchi.  Normal respiratory effort. Abdomen:  soft, ongoing TTP in RLQ > Supraumbilical region, ND Skin:  no rash or induration seen on limited exam Musculoskeletal:  grossly normal tone BUE/BLE, good ROM, no bony  abnormality Psychiatric:  anxious mood and affect, speech fluent and appropriate, AOx3 Neurologic:  CN 2-12 grossly intact, moves all extremities in coordinated fashion  Data Reviewed: I have reviewed the patient's lab results since admission.  Pertinent labs for today include:   Normal BMP Normal CBC     Family Communication: None present     Code Status: Full Code   Disposition: Status is: Inpatient Remains inpatient appropriate because: ongoing management     Time spent: 50 minutes  Unresulted Labs (From admission, onward)    None        Author: Delon Herald, MD 04/01/2024 8:01 AM  For on call review [email or web address].

## 2024-04-01 NOTE — Progress Notes (Signed)
 Progress Note     Subjective: Still with pain in his RLQ, holds it to walk.  Still with some nausea, but seems to be tolerating his FLD.  Objective: Vital signs in last 24 hours: Temp:  [98 F (36.7 C)-98.1 F (36.7 C)] 98.1 F (36.7 C) (12/18 0419) Pulse Rate:  [61-64] 64 (12/18 0419) Resp:  [18] 18 (12/18 0419) BP: (115-123)/(79) 115/79 (12/18 0419) SpO2:  [100 %] 100 % (12/18 0419) Last BM Date : 04/01/24  Intake/Output from previous day: 12/17 0701 - 12/18 0700 In: 1281.4 [P.O.:1000; I.V.:281.4] Out: 0  Intake/Output this shift: Total I/O In: 480 [P.O.:480] Out: -   PE: General: pleasant, NAD Lungs: Respiratory effort nonlabored Abd: soft, tender to palpation in RLQ with some voluntary guarding, but no peritonitis, ND  Lab Results:  Recent Labs    03/31/24 0633 04/01/24 0541  WBC 5.6 4.8  HGB 15.0 13.7  HCT 44.7 40.7  PLT 267 265   BMET Recent Labs    03/30/24 0536 04/01/24 0541  NA 137 142  K 4.4 5.1  CL 101 102  CO2 29 32  GLUCOSE 98 85  BUN 10 8  CREATININE 0.97 1.12  CALCIUM 9.0 9.4   PT/INR No results for input(s): LABPROT, INR in the last 72 hours. CMP     Component Value Date/Time   NA 142 04/01/2024 0541   K 5.1 04/01/2024 0541   CL 102 04/01/2024 0541   CO2 32 04/01/2024 0541   GLUCOSE 85 04/01/2024 0541   BUN 8 04/01/2024 0541   CREATININE 1.12 04/01/2024 0541   CALCIUM 9.4 04/01/2024 0541   PROT 6.0 (L) 03/30/2024 0536   ALBUMIN 3.6 03/30/2024 0536   AST 15 03/30/2024 0536   ALT 8 03/30/2024 0536   ALKPHOS 39 03/30/2024 0536   BILITOT 0.3 03/30/2024 0536   GFRNONAA >60 04/01/2024 0541   GFRAA >60 11/21/2018 1618   Lipase     Component Value Date/Time   LIPASE 17 03/29/2024 1309       Studies/Results: No results found.   Anti-infectives: Anti-infectives (From admission, onward)    Start     Dose/Rate Route Frequency Ordered Stop   03/30/24 2000  cefTRIAXone  (ROCEPHIN ) 2 g in sodium chloride  0.9 % 100  mL IVPB        2 g 200 mL/hr over 30 Minutes Intravenous Every 24 hours 03/29/24 2008     03/30/24 0800  metroNIDAZOLE  (FLAGYL ) IVPB 500 mg        500 mg 100 mL/hr over 60 Minutes Intravenous Every 12 hours 03/29/24 2008     03/29/24 1915  cefTRIAXone  (ROCEPHIN ) 2 g in sodium chloride  0.9 % 100 mL IVPB       Placed in And Linked Group   2 g 200 mL/hr over 30 Minutes Intravenous  Once 03/29/24 1905 03/29/24 2020   03/29/24 1915  metroNIDAZOLE  (FLAGYL ) IVPB 500 mg       Placed in And Linked Group   500 mg 100 mL/hr over 60 Minutes Intravenous  Once 03/29/24 1905 03/29/24 2100        Assessment/Plan  Acute appendicitis - CT with above, no abscess or free air, inflammatory changes of terminal ileum, no obstruction, no appendicolith - pt with symptoms for >1 week - no leukocytosis, afebrile and HD stable - recommend trial of non-operative management given increased risk of need for open operation and or ileocecectomy. If pt improves with abx then can plan outpatient follow up and interval  appendectomy in a few weeks.  - if pt does not improve with conservative management then would recommend repeat CT and discussion of possible OR pending CT findings.  He continues to have pain today with some nausea, despite normal labs and vitals.  Will likely plan for repeat CT tomorrow. - continue FLD today  - mobilize as able -saw in conjunction with primary service  FEN: FLD, IVF per [facility] VTE: ok to have SQH or LMWH from surgical standpoint ID: [email or web address] /[email or web address]   - per [facility] -  Schizoaffective disorder,  Bipolar I Disorder Anxiety PTSD    LOS: 2 days   I reviewed hospitalist notes, last 24 h vitals and pain scores, last 48 h intake and output, last 24 h labs and trends, and last 24 h imaging results.  Burnard FORBES Banter, [facility] 04/01/2024, 12:34 PM Please see Amion for pager number during day hours 7:00am-4:30pm

## 2024-04-02 ENCOUNTER — Inpatient Hospital Stay (HOSPITAL_COMMUNITY): Payer: Self-pay

## 2024-04-02 ENCOUNTER — Encounter (HOSPITAL_COMMUNITY): Payer: Self-pay | Admitting: Internal Medicine

## 2024-04-02 MED ORDER — AMOXICILLIN-POT CLAVULANATE 875-125 MG PO TABS
1.0000 | ORAL_TABLET | Freq: Two times a day (BID) | ORAL | 1 refills | Status: DC
  Filled 2024-04-02: qty 28, 14d supply, fill #0

## 2024-04-02 MED ORDER — IOHEXOL 300 MG/ML  SOLN
100.0000 mL | Freq: Once | INTRAMUSCULAR | Status: AC | PRN
  Administered 2024-04-02: 100 mL via INTRAVENOUS

## 2024-04-02 NOTE — Progress Notes (Addendum)
 " Progress Note   Patient: Ronald [name] FMW:[id number] DOB: 1980/08/30 DOA: 03/29/2024     3 DOS: the patient was seen and examined on 04/02/2024   Brief hospital course: 43yo with h/o PTSD, chronic anxiety, history of benzodiazepine dependence with prior history of early appendicitis in October 2025 who presented on 12/15 with intermittent RLQ pain associated with n/v/d.  CT with acute appendicitis.  Started on ceftriaxone , Flagyl .  Surgery was consulted, recommending trial of conservative management and have advanced diet.  If tolerating soft diet, will dc to home with PO antibiotics and outpatient surgery f/u for interval appendectomy.   Assessment & Plan Acute appendicitis First noted on CT scan done on February 09, 2024; treated conservatively at that time  Presented with right lower quadrant abdominal pain, associated with nausea vomiting and diarrhea CT scan again revealed acute appendicitis and the appendix may be adherent to the terminal ileum Placed on IV ceftriaxone  and metronidazole  Surgery consulted and recommends a trial of conservative management with IV antibiotics, as he would be at risk for needing an open procedure and ileocolectomy If he improves with antibiotics, then can plan for interval appendectomy in outpatient setting in a few weeks  If he fails conservative management, he will need repeat CT scan and possible discussion of OR pending the CT findings Diet has been advanced to full liquids Ongoing significant pain is in conflict with desire to advance diet as well as desire to avoid complicated surgery - he is aware of this complexity Increased Oxy back to 5-10 mg q4h prn with morphine  for breakthrough pain Repeat CT looks like the inflammation is improving Surgery recommends advancing to soft diet If tolerating, will dc to home tomorrow with ongoing pain control and plan for outpatient surgery f/u for interval appy Bipolar I disorder, single manic episode,  severe with psychotic features (HCC) PTSD (post-traumatic stress disorder) No longer taking Haldol  at home Continue Depakote , gabapentin , trazodone  Started on Clonazepam  for anxiety        Consultants: Surgery Inpatient case management   Procedures: None   Antibiotics: Ceftriaxone  12/15- Metronidazole  12/15-  30 Day Unplanned Readmission Risk Score    [name] to Hosp-Admission (Current) from 03/29/2024 in [facility]  30 Day Unplanned Readmission Risk Score (%) 18.14 Filed at 04/02/2024 0801    This score is the patient's risk of an unplanned readmission within 30 days of being discharged (0 -100%). The score is based on dignosis, age, lab data, medications, orders, and past utilization.   Low:  0-14.9   Medium: 15-21.9   High: 22-29.9   Extreme: 30 and above           Subjective: Frustrated this AM about how he was starving to death.  He was very frustrated but appeared to understand that we were waiting for the CT.  He also reported 7/10 pain.   Objective: Vitals:   04/02/24 0654 04/02/24 1124  BP: (!) 96/38 127/81  Pulse: 69 62  Resp: 18 16  Temp: 98 F (36.7 C) 98.9 F (37.2 C)  SpO2: 97%    No intake or output data in the 24 hours ending 04/02/24 1612 Filed Weights   03/29/24 1213  Weight: 79.4 kg    Exam:  General:  Appears calm and comfortable and is in NAD Eyes:  normal lids, iris ENT:  grossly normal hearing, lips & tongue, mmm Cardiovascular:  RRR. No LE edema.  Respiratory:   CTA bilaterally  with no wheezes/rales/rhonchi.  Normal respiratory effort. Abdomen:  soft, NT, ND Skin:  no rash or induration seen on limited exam Musculoskeletal:  grossly normal tone BUE/BLE, good ROM, no bony abnormality Psychiatric:  frustrated mood and affect, speech fluent and appropriate, AOx3 Neurologic:  CN 2-12 grossly intact, moves all extremities in coordinated fashion  Data Reviewed: I have reviewed the  patient's lab results since admission.  Pertinent labs for today include:   None today     Family Communication: Surgery spoke with his mother this AM     Code Status: Full Code   Disposition: Status is: Inpatient Remains inpatient appropriate because: ongoing management     Time spent: 50 minutes  Unresulted Labs (From admission, onward)    None        Author: Delon Herald, MD 04/02/2024 4:12 PM  For on call review [email or web address]            "

## 2024-04-02 NOTE — Assessment & Plan Note (Addendum)
 No longer taking Haldol  at home Continue Depakote , gabapentin , trazodone  Started on Clonazepam  for anxiety

## 2024-04-02 NOTE — Progress Notes (Signed)
 Discharge meds to [facility]

## 2024-04-02 NOTE — Plan of Care (Signed)

## 2024-04-02 NOTE — Progress Notes (Signed)
 "  Progress Note     Subjective: Still with pain in his RLQ, but reports as less overall. Vomited once overnight but thinks this is because he got a lot of medicine all at once. Denies nausea this AM. Having bowel function.   Objective: Vital signs in last 24 hours: Temp:  [98 F (36.7 C)-98.5 F (36.9 C)] 98 F (36.7 C) (12/19 0654) Pulse Rate:  [66-77] 69 (12/19 0654) Resp:  [16-18] 18 (12/19 0654) BP: (96-135)/(38-96) 96/38 (12/19 0654) SpO2:  [97 %-100 %] 97 % (12/19 0654) Last BM Date : 04/01/24  Intake/Output from previous day: 12/18 0701 - 12/19 0700 In: 480 [P.O.:480] Out: -  Intake/Output this shift: No intake/output data recorded.  PE: General: pleasant, NAD Lungs: Respiratory effort nonlabored Abd: soft, focally tender to palpation in RLQ without voluntary guarding, but no peritonitis, ND  Lab Results:  Recent Labs    03/31/24 0633 04/01/24 0541  WBC 5.6 4.8  HGB 15.0 13.7  HCT 44.7 40.7  PLT 267 265   BMET Recent Labs    04/01/24 0541  NA 142  K 5.1  CL 102  CO2 32  GLUCOSE 85  BUN 8  CREATININE 1.12  CALCIUM 9.4   PT/INR No results for input(s): LABPROT, INR in the last 72 hours. CMP     Component Value Date/Time   NA 142 04/01/2024 0541   K 5.1 04/01/2024 0541   CL 102 04/01/2024 0541   CO2 32 04/01/2024 0541   GLUCOSE 85 04/01/2024 0541   BUN 8 04/01/2024 0541   CREATININE 1.12 04/01/2024 0541   CALCIUM 9.4 04/01/2024 0541   PROT 6.0 (L) 03/30/2024 0536   ALBUMIN 3.6 03/30/2024 0536   AST 15 03/30/2024 0536   ALT 8 03/30/2024 0536   ALKPHOS 39 03/30/2024 0536   BILITOT 0.3 03/30/2024 0536   GFRNONAA >60 04/01/2024 0541   GFRAA >60 11/21/2018 1618   Lipase     Component Value Date/Time   LIPASE 17 03/29/2024 1309       Studies/Results: No results found.   Anti-infectives: Anti-infectives (From admission, onward)    Start     Dose/Rate Route Frequency Ordered Stop   03/30/24 2000  cefTRIAXone  (ROCEPHIN ) 2 g  in sodium chloride  0.9 % 100 mL IVPB        2 g 200 mL/hr over 30 Minutes Intravenous Every 24 hours 03/29/24 2008     03/30/24 0800  metroNIDAZOLE  (FLAGYL ) IVPB 500 mg        500 mg 100 mL/hr over 60 Minutes Intravenous Every 12 hours 03/29/24 2008     03/29/24 1915  cefTRIAXone  (ROCEPHIN ) 2 g in sodium chloride  0.9 % 100 mL IVPB       Placed in And Linked Group   2 g 200 mL/hr over 30 Minutes Intravenous  Once 03/29/24 1905 03/29/24 2020   03/29/24 1915  metroNIDAZOLE  (FLAGYL ) IVPB 500 mg       Placed in And Linked Group   500 mg 100 mL/hr over 60 Minutes Intravenous  Once 03/29/24 1905 03/29/24 2100        Assessment/Plan  Acute appendicitis - CT with above, no abscess or free air, inflammatory changes of terminal ileum, no obstruction, no appendicolith - pt with symptoms for >1 week - no leukocytosis, afebrile and HD stable - recommend trial of non-operative management given increased risk of need for open operation and or ileocecectomy. If pt improves with abx then can plan outpatient follow up and  interval appendectomy in a few weeks.  - if pt does not improve with conservative management then would recommend repeat CT and discussion of possible OR pending CT findings.  He continues to have pain today with some nausea, despite normal labs and vitals.   - decreased to CLD yesterday - mobilize as able - CT AP today  - I spoke with his mother by phone and updated her this AM on plans  FEN: CLD, IVF per [facility] VTE: ok to have SQH or LMWH from surgical standpoint ID: [email or web address] /[email or web address]   - per [facility] -  Schizoaffective disorder Bipolar I Disorder Anxiety PTSD    LOS: 3 days   I reviewed hospitalist notes, last 24 h vitals and pain scores, last 48 h intake and output, last 24 h labs and trends, and last 24 h imaging results.  Burnard JONELLE Louder, [facility] 04/02/2024, 7:50 AM Please see Amion for pager number during day hours 7:00am-4:30pm  "

## 2024-04-02 NOTE — Assessment & Plan Note (Addendum)
 First noted on CT scan done on February 09, 2024; treated conservatively at that time  Presented with right lower quadrant abdominal pain, associated with nausea vomiting and diarrhea CT scan again revealed acute appendicitis and the appendix may be adherent to the terminal ileum Placed on IV ceftriaxone  and metronidazole  Surgery consulted and recommends a trial of conservative management with IV antibiotics, as he would be at risk for needing an open procedure and ileocolectomy If he improves with antibiotics, then can plan for interval appendectomy in outpatient setting in a few weeks  If he fails conservative management, he will need repeat CT scan and possible discussion of OR pending the CT findings Diet has been advanced to full liquids Ongoing significant pain is in conflict with desire to advance diet as well as desire to avoid complicated surgery - he is aware of this complexity Increased Oxy back to 5-10 mg q4h prn with morphine  for breakthrough pain Repeat CT looks like the inflammation is improving Surgery recommends advancing to soft diet If tolerating, will dc to home tomorrow with ongoing pain control and plan for outpatient surgery f/u for interval appy

## 2024-04-02 NOTE — Progress Notes (Addendum)
 Reviewed patients CT scan with attending MD Dr. Curvin. Await final read from radiology. Appears to have less inflammatory changes around the appendix.   I called the patients mother, Ronald [name], to update her on our interpretation of the CT can results and plans for possible discharge on PO abx soon if he continue to clinically improve. Discussed outpatient follow up with plans for interval appendectomy. If he shows signs of clinical deterioration today or tomorrow (fever, vomiting, severe pain) then he may need an operation before he goes home, which would place him at risk for conversion to an open operation/ileocecectomy. All of her questions were welcomed and answered.    Ronald Pringle, PA-C [facility] Please see Amion for pager number during day hours 7:00am-4:30pm

## 2024-04-03 ENCOUNTER — Other Ambulatory Visit (HOSPITAL_COMMUNITY): Payer: Self-pay

## 2024-04-03 ENCOUNTER — Other Ambulatory Visit: Payer: Self-pay

## 2024-04-03 MED ORDER — OXYCODONE HCL 5 MG PO TABS
5.0000 mg | ORAL_TABLET | ORAL | 0 refills | Status: DC | PRN
  Filled 2024-04-03: qty 20, 2d supply, fill #0

## 2024-04-03 MED ORDER — AMOXICILLIN-POT CLAVULANATE 875-125 MG PO TABS
1.0000 | ORAL_TABLET | Freq: Two times a day (BID) | ORAL | 0 refills | Status: DC
  Filled 2024-04-03: qty 28, 14d supply, fill #0

## 2024-04-03 MED ORDER — ONDANSETRON HCL 4 MG PO TABS
4.0000 mg | ORAL_TABLET | Freq: Every day | ORAL | 1 refills | Status: DC | PRN
  Filled 2024-04-03: qty 30, 30d supply, fill #0

## 2024-04-03 MED ORDER — ACETAMINOPHEN 325 MG PO TABS: 650.0000 mg | ORAL_TABLET | Freq: Four times a day (QID) | ORAL | Status: DC | PRN

## 2024-04-03 NOTE — Plan of Care (Signed)

## 2024-04-03 NOTE — Progress Notes (Signed)
 "    Subjective/Chief Complaint: Patient sitting up and eating breakfast Reports RLQ pain in much less   Objective: Vital signs in last 24 hours: Temp:  [97.9 F (36.6 C)-98.9 F (37.2 C)] 97.9 F (36.6 C) (12/20 0509) Pulse Rate:  [60-63] 60 (12/20 0509) Resp:  [15-18] 15 (12/20 0509) BP: (97-127)/(63-81) 97/63 (12/20 0509) SpO2:  [95 %-96 %] 95 % (12/20 0509) Last BM Date : 04/02/24  Intake/Output from previous day: No intake/output data recorded. Intake/Output this shift: No intake/output data recorded.  Exam: Awake and alert Comfortable in appearance Abdomen soft, non-distended, mild RLQ tenderness with minimal guarding  Lab Results:  Recent Labs    04/01/24 0541  WBC 4.8  HGB 13.7  HCT 40.7  PLT 265   BMET Recent Labs    04/01/24 0541  NA 142  K 5.1  CL 102  CO2 32  GLUCOSE 85  BUN 8  CREATININE 1.12  CALCIUM  9.4   PT/INR No results for input(s): LABPROT, INR in the last 72 hours. ABG No results for input(s): PHART, HCO3 in the last 72 hours.  Invalid input(s): PCO2, PO2  Studies/Results: CT ABDOMEN PELVIS W CONTRAST Result Date: 04/02/2024 CLINICAL DATA:  Right lower quadrant pain for several days, history of abnormal appendix on prior exam. EXAM: CT ABDOMEN AND PELVIS WITH CONTRAST TECHNIQUE: Multidetector CT imaging of the abdomen and pelvis was performed using the standard protocol following bolus administration of intravenous contrast. RADIATION DOSE REDUCTION: This exam was performed according to the departmental dose-optimization program which includes automated exposure control, adjustment of the mA and/or kV according to patient size and/or use of iterative reconstruction technique. CONTRAST:  OMNIPAQUE  IOHEXOL  300 MG/ML  SOLN COMPARISON:  03/29/2024. FINDINGS: Lower chest: No acute abnormality. Hepatobiliary: No focal liver abnormality is seen. No gallstones, gallbladder wall thickening, or biliary dilatation. Pancreas:  Unremarkable. No pancreatic ductal dilatation or surrounding inflammatory changes. Spleen: Normal in size without focal abnormality. Adrenals/Urinary Tract: Adrenal glands are unremarkable. Kidneys are normal, without renal calculi, focal lesion, or hydronephrosis. Bladder is unremarkable. Stomach/Bowel: No obstructive or inflammatory changes of the colon are noted. There remains dilatation of the appendix with surrounding inflammatory change. Some slight decrease in inflammatory change is noted no definitive signs of perforation or abscess formation are noted at this time. Small bowel and stomach are within normal limits. Vascular/Lymphatic: No significant vascular findings are present. No enlarged abdominal or pelvic lymph nodes. Reproductive: Prostate is unremarkable. Other: No abdominal wall hernia or abnormality. No abdominopelvic ascites. Musculoskeletal: No acute or significant osseous findings. IMPRESSION: Changes consistent with acute appendicitis with very minimal decrease in inflammatory changes. No findings to suggest perforation or abscess formation are noted at this time. Electronically Signed   By: Oneil Devonshire M.D.   On: 04/02/2024 20:25    Anti-infectives: Anti-infectives (From admission, onward)    Start     Dose/Rate Route Frequency Ordered Stop   04/02/24 0000  amoxicillin -clavulanate (AUGMENTIN ) 875-125 MG tablet        1 tablet Oral 2 times daily 04/02/24 1218 04/16/24 2359   03/30/24 2000  cefTRIAXone  (ROCEPHIN ) 2 g in sodium chloride  0.9 % 100 mL IVPB        2 g 200 mL/hr over 30 Minutes Intravenous Every 24 hours 03/29/24 2008     03/30/24 0800  metroNIDAZOLE  (FLAGYL ) IVPB 500 mg        500 mg 100 mL/hr over 60 Minutes Intravenous Every 12 hours 03/29/24 2008  03/29/24 1915  cefTRIAXone  (ROCEPHIN ) 2 g in sodium chloride  0.9 % 100 mL IVPB       Placed in And Linked Group   2 g 200 mL/hr over 30 Minutes Intravenous  Once 03/29/24 1905 03/29/24 2020   03/29/24 1915   metroNIDAZOLE  (FLAGYL ) IVPB 500 mg       Placed in And Linked Group   500 mg 100 mL/hr over 60 Minutes Intravenous  Once 03/29/24 1905 03/29/24 2100       Assessment/Plan: Acute appendicitis  -clinically, he has improved.  We discussed another day of IV antibiotics vs home today on 7 to 10 days more of oral antibiotics. Recommend Augmentin . He would like to go home and continue outpt management which is reasonable given how his exam is clinically.  LOS: 4 days    Vicenta Poli 04/03/2024  "

## 2024-04-03 NOTE — Assessment & Plan Note (Addendum)
 No longer taking Haldol  at home Continue Depakote , gabapentin , trazodone  Started on Clonazepam  for anxiety

## 2024-04-03 NOTE — Progress Notes (Addendum)
 AVS reviewed with patient who verbalized an understanding. Discharge meds in a secure bag delivered to patient by this RN at 1441.  Oxycodone  not in current bag- will pick up along with zofran - patient requested zofran  for home- request sent to Dr Barbarann by primary nurse. Additional meds delivered by this RN at 1415

## 2024-04-03 NOTE — Discharge Summary (Addendum)
 " Physician Discharge Summary   Patient: Ronald [name] MRN: [id number] DOB: [birth year]  Admit date:     03/29/2024  Discharge date: 04/03/2024  Discharge Physician: Delon Herald   PCP: Joshua Debby CROME, MD   Recommendations at discharge:   Complete antibiotics (Augmentin  twice daily for 14 days) You are being prescribed a limited number of narcotic pain pills; take as directed and do not drive or make important decisions while taking this medication  You are also being prescribed medication to take if needed for nausea Slowly advance diet as tolerated; if you feel worse, back diet back down to clear liquids Follow up with surgery as advised, as you will need interval appendectomy If severe abdominal pain, fever, and nausea/vomiting return, come back to the ER Follow up with Dr. Joshua in 1-2 weeks  Discharge Diagnoses: Principal Problem:   Acute appendicitis Active Problems:   Bipolar I disorder, single manic episode, severe with psychotic features [facility])   PTSD (post-traumatic stress disorder)    Hospital Course: 42yo with h/o PTSD, chronic anxiety, history of benzodiazepine dependence with prior history of early appendicitis in October 2025 who presented on 12/15 with intermittent RLQ pain associated with n/v/d.  CT with acute appendicitis.  Started on ceftriaxone , Flagyl .  Surgery was consulted, recommending trial of conservative management and have advanced diet.  If tolerating soft diet, will dc to home with PO antibiotics and outpatient surgery f/u for interval appendectomy.  Assessment and Plan:  Assessment & Plan Acute appendicitis First noted on CT scan done on February 09, 2024; treated conservatively at that time  Presented with right lower quadrant abdominal pain, associated with nausea vomiting and diarrhea CT scan again revealed acute appendicitis and the appendix may be adherent to the terminal ileum Placed on IV ceftriaxone  and metronidazole  Surgery  consulted and recommends a trial of conservative management with IV antibiotics, as he would be at risk for needing an open procedure and ileocolectomy If he improves with antibiotics, then can plan for interval appendectomy in outpatient setting in a few weeks  If he fails conservative management, he will need repeat CT scan and possible discussion of OR pending the CT findings Diet has been advanced to full liquids Ongoing significant pain is in conflict with desire to advance diet as well as desire to avoid complicated surgery - he is aware of this complexity Increased Oxy back to 5-10 mg q4h prn with morphine  for breakthrough pain Repeat CT with minimal improvement but no findings to suggest perforation or abscess Advanced to soft diet and tolerating without difficulty He is eager to dc to home today with ongoing pain control and plan for outpatient surgery f/u for interval appy Bipolar I disorder, single manic episode, severe with psychotic features (HCC) PTSD (post-traumatic stress disorder) No longer taking Haldol  at home Continue Depakote , gabapentin , trazodone  Started on Clonazepam  for anxiety     Consultants: Surgery Inpatient case management   Procedures: None   Antibiotics: Ceftriaxone  12/15- Metronidazole  12/15-   Pain control - [place]  Controlled Substance Reporting System database was reviewed. and patient was instructed, not to drive, operate heavy machinery, perform activities at heights, swimming or participation in water  activities or provide baby-sitting services while on Pain, Sleep and Anxiety Medications; until their outpatient Physician has advised to do so again. Also recommended to not to take more than prescribed Pain, Sleep and Anxiety Medications.   Disposition: Home Diet recommendation:  Clear liquid diet, advance as tolerated DISCHARGE MEDICATION: Allergies as of 04/03/2024  Reactions   Vistaril  [hydroxyzine ] Itching, Anxiety, Other  (See Comments)   Hyperactivity    Alupent [metaproterenol] Other (See Comments)   Hyperactivity   Seroquel  [quetiapine ] Other (See Comments)   Hallucinations   Zyprexa  [olanzapine ] Other (See Comments)   made patient feel weird   Antihistamines, Chlorpheniramine-type Anxiety, Other (See Comments)   made patient feel insane - all antihistamines   Antihistamines, Diphenhydramine -type Anxiety, Other (See Comments)   made patient feel insane - all antihistamines   Antihistamines, Loratadine-type Anxiety, Other (See Comments)   made patient feel insane - all antihistamines   Clonidine And Derivatives Anxiety, Other (See Comments)   Caused a red/flushed face and had a panic attack        Medication List     STOP taking these medications    haloperidol  10 MG tablet Commonly known as: HALDOL        TAKE these medications    acetaminophen  325 MG tablet Commonly known as: TYLENOL  Take 2 tablets (650 mg total) by mouth every 6 (six) hours as needed for mild pain (pain score 1-3), fever or headache.   amoxicillin -clavulanate 875-125 MG tablet Commonly known as: AUGMENTIN  Take 1 tablet by mouth 2 (two) times daily for 14 days. Notes to patient: Take with food   divalproex  500 MG DR tablet Commonly known as: DEPAKOTE  Take 2 tablets (1,000 mg total) by mouth 2 (two) times daily. What changed:  when to take this additional instructions   gabapentin  100 MG capsule Commonly known as: NEURONTIN  Take 2 capsules (200 mg total) by mouth 3 (three) times daily. What changed:  when to take this additional instructions   ibuprofen  200 MG tablet Commonly known as: ADVIL  Take 800 mg by mouth daily as needed (for pain or headaches).   Mens Multivitamin Tabs Take 1 tablet by mouth every 7 (seven) days.   ondansetron  4 MG tablet Commonly known as: Zofran  Take 1 tablet (4 mg total) by mouth daily as needed for nausea or vomiting.   oxyCODONE  5 MG immediate release  tablet Commonly known as: Oxy IR/ROXICODONE  Take 1-2 tablets (5-10 mg total) by mouth every 4 (four) hours as needed for severe pain (pain score 7-10) or moderate pain (pain score 4-6).   traZODone  50 MG tablet Commonly known as: DESYREL  Take 1 tablet (50 mg total) by mouth at bedtime.        Follow-up Information     Joshua Debby CROME, MD Follow up.   Specialty: Internal Medicine Contact information: [address] [name] [address] [phone]         Curvin Mt III, MD. Schedule an appointment as soon as possible for a visit in 3 week(s).   Specialty: General Surgery Why: surgical followup Contact information: [address] [name] [address] [phone]                Discharge Exam:   Subjective: Feeling better, pain is more controlled, eager to go home today.   Objective: Vitals:   04/03/24 0509 04/03/24 1151  BP: 97/63 110/70  Pulse: 60 65  Resp: 15 15  Temp: 97.9 F (36.6 C) 98.2 F (36.8 C)  SpO2: 95% 97%   No intake or output data in the 24 hours ending 04/03/24 1359 Filed Weights   03/29/24 1213  Weight: 79.4 kg    Exam:  General:  Appears calm and comfortable and is in NAD Eyes:  normal lids, iris ENT:  grossly normal hearing, lips & tongue,  mmm Cardiovascular:  RRR. No LE edema.  Respiratory:   CTA bilaterally with no wheezes/rales/rhonchi.  Normal respiratory effort. Abdomen:  soft, NT, ND Skin:  no rash or induration seen on limited exam Musculoskeletal:  grossly normal tone BUE/BLE, good ROM, no bony abnormality Psychiatric:  grossly normal mood and affect, speech fluent and appropriate, AOx3 Neurologic:  CN 2-12 grossly intact, moves all extremities in coordinated fashion  Data Reviewed: I have reviewed the patient's lab results since admission.  Pertinent labs for today include:   None today    Condition at discharge: stable  The results of significant diagnostics from this hospitalization  (including imaging, microbiology, ancillary and laboratory) are listed below for reference.   Imaging Studies: CT ABDOMEN PELVIS W CONTRAST Result Date: 04/02/2024 CLINICAL DATA:  Right lower quadrant pain for several days, history of abnormal appendix on prior exam. EXAM: CT ABDOMEN AND PELVIS WITH CONTRAST TECHNIQUE: Multidetector CT imaging of the abdomen and pelvis was performed using the standard protocol following bolus administration of intravenous contrast. RADIATION DOSE REDUCTION: This exam was performed according to the departmental dose-optimization program which includes automated exposure control, adjustment of the mA and/or kV according to patient size and/or use of iterative reconstruction technique. CONTRAST:  OMNIPAQUE  IOHEXOL  300 MG/ML  SOLN COMPARISON:  03/29/2024. FINDINGS: Lower chest: No acute abnormality. Hepatobiliary: No focal liver abnormality is seen. No gallstones, gallbladder wall thickening, or biliary dilatation. Pancreas: Unremarkable. No pancreatic ductal dilatation or surrounding inflammatory changes. Spleen: Normal in size without focal abnormality. Adrenals/Urinary Tract: Adrenal glands are unremarkable. Kidneys are normal, without renal calculi, focal lesion, or hydronephrosis. Bladder is unremarkable. Stomach/Bowel: No obstructive or inflammatory changes of the colon are noted. There remains dilatation of the appendix with surrounding inflammatory change. Some slight decrease in inflammatory change is noted no definitive signs of perforation or abscess formation are noted at this time. Small bowel and stomach are within normal limits. Vascular/Lymphatic: No significant vascular findings are present. No enlarged abdominal or pelvic lymph nodes. Reproductive: Prostate is unremarkable. Other: No abdominal wall hernia or abnormality. No abdominopelvic ascites. Musculoskeletal: No acute or significant osseous findings. IMPRESSION: Changes consistent with acute  appendicitis with very minimal decrease in inflammatory changes. No findings to suggest perforation or abscess formation are noted at this time. Electronically Signed   By: Oneil Devonshire M.D.   On: 04/02/2024 20:25   CT ABDOMEN PELVIS W CONTRAST Result Date: 03/29/2024 EXAM: CT ABDOMEN AND PELVIS WITH CONTRAST 03/29/2024 05:48:36 PM TECHNIQUE: CT of the abdomen and pelvis was performed with the administration of 100 mL of iohexol  (OMNIPAQUE ) 300 MG/ML solution. Multiplanar reformatted images are provided for review. Automated exposure control, iterative reconstruction, and/or weight-based adjustment of the mA/kV was utilized to reduce the radiation dose to as low as reasonably achievable. COMPARISON: 02/09/2024. CLINICAL HISTORY: RLQ abdominal pain; possible appendicitis. FINDINGS: LOWER CHEST: Bilateral lower lobe atelectasis. LIVER: The liver is unremarkable. GALLBLADDER AND BILE DUCTS: Gallbladder is unremarkable. No biliary ductal dilatation. SPLEEN: No acute abnormality. PANCREAS: No acute abnormality. ADRENAL GLANDS: No acute abnormality. KIDNEYS, URETERS AND BLADDER: No stones in the kidneys or ureters. No hydronephrosis. No perinephric or periureteral stranding. The bladder is mildly thick-walled but underdistended. GI AND BOWEL: Stomach demonstrates no acute abnormality. Abnormal appendix, measuring up to 16 mm with periappendiceal stranding (image 95), reflecting acute appendicitis. Suspected mild secondary inflammatory changes involving the adjacent terminal ileum. There is no bowel obstruction. PERITONEUM AND RETROPERITONEUM: No drainable fluid collection/abscess. No free air. VASCULATURE: Aorta is normal  in caliber. LYMPH NODES: No lymphadenopathy. REPRODUCTIVE ORGANS: The prostate is unremarkable. BONES AND SOFT TISSUES: Mild degenerative changes of the lumbar spine. No acute osseous abnormality. No focal soft tissue abnormality. IMPRESSION: 1. Acute appendicitis. No drainable fluid collection or  free air. 2. Suspected mild secondary inflammatory changes involving the adjacent terminal ileum. Electronically signed by: Pinkie Pebbles MD 03/29/2024 06:34 PM EST RP Workstation: [id number]    Microbiology: Results for orders placed or performed during the hospital encounter of 06/26/23  SARS Coronavirus 2 by RT PCR (hospital order, performed in [facility] hospital lab) *cepheid single result test* Anterior Nasal Swab     Status: None   Collection Time: 06/27/23  5:58 PM   Specimen: Anterior Nasal Swab  Result Value Ref Range Status   SARS Coronavirus 2 by RT PCR NEGATIVE NEGATIVE Final    Comment: (NOTE) SARS-CoV-2 target nucleic acids are NOT DETECTED.  The SARS-CoV-2 RNA is generally detectable in upper and lower respiratory specimens during the acute phase of infection. The lowest concentration of SARS-CoV-2 viral copies this assay can detect is 250 copies / mL. A negative result does not preclude SARS-CoV-2 infection and should not be used as the sole basis for treatment or other patient management decisions.  A negative result may occur with improper specimen collection / handling, submission of specimen other than nasopharyngeal swab, presence of viral mutation(s) within the areas targeted by this assay, and inadequate number of viral copies (<250 copies / mL). A negative result must be combined with clinical observations, patient history, and epidemiological information.  Fact Sheet for Patients:   roadlaptop.co.za  Fact Sheet for Healthcare Providers: http://kim-miller.com/  This test is not yet approved or  cleared by the [place]  FDA and has been authorized for detection and/or diagnosis of SARS-CoV-2 by FDA under an Emergency Use Authorization (EUA).  This EUA will remain in effect (meaning this test can be used) for the duration of the COVID-19 declaration under Section 564(b)(1) of the Act, 21 U.S.C. section  360bbb-3(b)(1), unless the authorization is terminated or revoked sooner.  Performed at [facility], [address], [place], [name] [address]     Labs: CBC: Recent Labs  Lab 03/29/24 1309 03/30/24 0536 03/31/24 0633 04/01/24 0541  WBC 6.9 6.7 5.6 4.8  HGB 17.4* 13.1 15.0 13.7  HCT 50.2 37.0* 44.7 40.7  MCV 88.8 88.9 91.0 90.8  PLT 304 236 267 265   Basic Metabolic Panel: Recent Labs  Lab 03/29/24 1309 03/30/24 0536 04/01/24 0541  NA 137 137 142  K 3.7 4.4 5.1  CL 96* 101 102  CO2 25 29 32  GLUCOSE 137* 98 85  BUN 11 10 8   CREATININE 0.93 0.97 1.12  CALCIUM  10.5* 9.0 9.4  MG  --  2.1  --   PHOS  --  3.8  --    Liver Function Tests: Recent Labs  Lab 03/29/24 1309 03/30/24 0536  AST 19 15  ALT 13 8  ALKPHOS 59 39  BILITOT 0.5 0.3  PROT 9.1* 6.0*  ALBUMIN 5.1* 3.6   CBG: No results for input(s): GLUCAP in the last 168 hours.  Discharge time spent: greater than 30 minutes.  Signed: Delon Herald, MD [facility] 04/03/2024 "

## 2024-04-03 NOTE — Discharge Instructions (Signed)
 Call  [facility] at [phone] if experiencing increased pain or fever of 101 or higher  Call the office Monday to make a follow up appointment with Dr. Curvin

## 2024-04-03 NOTE — Assessment & Plan Note (Addendum)
 First noted on CT scan done on February 09, 2024; treated conservatively at that time  Presented with right lower quadrant abdominal pain, associated with nausea vomiting and diarrhea CT scan again revealed acute appendicitis and the appendix may be adherent to the terminal ileum Placed on IV ceftriaxone  and metronidazole  Surgery consulted and recommends a trial of conservative management with IV antibiotics, as he would be at risk for needing an open procedure and ileocolectomy If he improves with antibiotics, then can plan for interval appendectomy in outpatient setting in a few weeks  If he fails conservative management, he will need repeat CT scan and possible discussion of OR pending the CT findings Diet has been advanced to full liquids Ongoing significant pain is in conflict with desire to advance diet as well as desire to avoid complicated surgery - he is aware of this complexity Increased Oxy back to 5-10 mg q4h prn with morphine  for breakthrough pain Repeat CT with minimal improvement but no findings to suggest perforation or abscess Advanced to soft diet and tolerating without difficulty He is eager to dc to home today with ongoing pain control and plan for outpatient surgery f/u for interval appy

## 2024-04-04 ENCOUNTER — Encounter (HOSPITAL_COMMUNITY): Payer: Self-pay | Admitting: Emergency Medicine

## 2024-04-04 ENCOUNTER — Other Ambulatory Visit: Payer: Self-pay

## 2024-04-04 ENCOUNTER — Emergency Department (HOSPITAL_COMMUNITY): Payer: Self-pay

## 2024-04-04 ENCOUNTER — Inpatient Hospital Stay (HOSPITAL_COMMUNITY)
Admission: EM | Admit: 2024-04-04 | Discharge: 2024-04-08 | DRG: 394 | Disposition: A | Discharge status: Home / Self Care | Payer: Self-pay | Attending: Internal Medicine | Admitting: Internal Medicine

## 2024-04-04 DIAGNOSIS — F17220 Nicotine dependence, chewing tobacco, uncomplicated: Secondary | ICD-10-CM | POA: Diagnosis present

## 2024-04-04 DIAGNOSIS — Z889 Allergy status to unspecified drugs, medicaments and biological substances status: Secondary | ICD-10-CM

## 2024-04-04 DIAGNOSIS — F4310 Post-traumatic stress disorder, unspecified: Secondary | ICD-10-CM | POA: Diagnosis present

## 2024-04-04 DIAGNOSIS — F200 Paranoid schizophrenia: Secondary | ICD-10-CM | POA: Diagnosis present

## 2024-04-04 DIAGNOSIS — F411 Generalized anxiety disorder: Secondary | ICD-10-CM | POA: Diagnosis present

## 2024-04-04 DIAGNOSIS — F5101 Primary insomnia: Secondary | ICD-10-CM

## 2024-04-04 DIAGNOSIS — F430 Acute stress reaction: Secondary | ICD-10-CM | POA: Diagnosis present

## 2024-04-04 DIAGNOSIS — Z79899 Other long term (current) drug therapy: Secondary | ICD-10-CM

## 2024-04-04 DIAGNOSIS — K861 Other chronic pancreatitis: Secondary | ICD-10-CM | POA: Diagnosis present

## 2024-04-04 DIAGNOSIS — G4700 Insomnia, unspecified: Secondary | ICD-10-CM | POA: Diagnosis present

## 2024-04-04 DIAGNOSIS — F302 Manic episode, severe with psychotic symptoms: Secondary | ICD-10-CM | POA: Diagnosis present

## 2024-04-04 DIAGNOSIS — F3112 Bipolar disorder, current episode manic without psychotic features, moderate: Secondary | ICD-10-CM | POA: Diagnosis present

## 2024-04-04 DIAGNOSIS — F1994 Other psychoactive substance use, unspecified with psychoactive substance-induced mood disorder: Secondary | ICD-10-CM | POA: Diagnosis present

## 2024-04-04 DIAGNOSIS — E871 Hypo-osmolality and hyponatremia: Secondary | ICD-10-CM | POA: Diagnosis not present

## 2024-04-04 DIAGNOSIS — F1210 Cannabis abuse, uncomplicated: Secondary | ICD-10-CM | POA: Diagnosis present

## 2024-04-04 DIAGNOSIS — K36 Other appendicitis: Principal | ICD-10-CM | POA: Diagnosis present

## 2024-04-04 DIAGNOSIS — Z8659 Personal history of other mental and behavioral disorders: Secondary | ICD-10-CM

## 2024-04-04 DIAGNOSIS — K3580 Unspecified acute appendicitis: Principal | ICD-10-CM

## 2024-04-04 DIAGNOSIS — Z5941 Food insecurity: Secondary | ICD-10-CM

## 2024-04-04 LAB — COMPREHENSIVE METABOLIC PANEL WITH GFR
ALT: 19 U/L (ref 0–44)
AST: 29 U/L (ref 15–41)
Albumin: 4.2 g/dL (ref 3.5–5.0)
Alkaline Phosphatase: 44 U/L (ref 38–126)
Anion gap: 10 (ref 5–15)
BUN: 10 mg/dL (ref 6–20)
CO2: 25 mmol/L (ref 22–32)
Calcium: 9.1 mg/dL (ref 8.9–10.3)
Chloride: 99 mmol/L (ref 98–111)
Creatinine, Ser: 0.82 mg/dL (ref 0.61–1.24)
GFR, Estimated: >60 mL/min
Glucose, Bld: 108 mg/dL — ABNORMAL HIGH (ref 70–99)
Potassium: 4.5 mmol/L (ref 3.5–5.1)
Sodium: 134 mmol/L — ABNORMAL LOW (ref 135–145)
Total Bilirubin: 0.3 mg/dL (ref 0.0–1.2)
Total Protein: 6.8 g/dL (ref 6.5–8.1)

## 2024-04-04 LAB — CBC
HCT: 40.6 % (ref 39.0–52.0)
Hemoglobin: 14.2 g/dL (ref 13.0–17.0)
MCH: 30.9 pg (ref 26.0–34.0)
MCHC: 35 g/dL (ref 30.0–36.0)
MCV: 88.5 fL (ref 80.0–100.0)
Platelets: 243 K/uL (ref 150–400)
RBC: 4.59 MIL/uL (ref 4.22–5.81)
RDW: 12 % (ref 11.5–15.5)
WBC: 5.4 K/uL (ref 4.0–10.5)
nRBC: 0 % (ref 0.0–0.2)

## 2024-04-04 LAB — URINALYSIS, ROUTINE W REFLEX MICROSCOPIC
Bacteria, UA: NONE SEEN
Bilirubin Urine: NEGATIVE
Glucose, UA: NEGATIVE mg/dL
Hgb urine dipstick: NEGATIVE
Ketones, ur: 5 mg/dL — AB
Nitrite: NEGATIVE
Protein, ur: 30 mg/dL — AB
Specific Gravity, Urine: 1.032 — ABNORMAL HIGH (ref 1.005–1.030)
pH: 5 (ref 5.0–8.0)

## 2024-04-04 MED ORDER — LACTATED RINGERS IV BOLUS: 1000.0000 mL | Freq: Three times a day (TID) | INTRAVENOUS | Status: AC | PRN

## 2024-04-04 MED ORDER — PHENOL 1.4 % MT LIQD: 2.0000 | OROMUCOSAL | Status: DC | PRN

## 2024-04-04 MED ORDER — ACETAMINOPHEN 500 MG PO TABS
1000.0000 mg | ORAL_TABLET | Freq: Four times a day (QID) | ORAL | Status: DC
  Administered 2024-04-04 – 2024-04-08 (×11): 1000 mg via ORAL
  Filled 2024-04-04 (×12): qty 2

## 2024-04-04 MED ORDER — PIPERACILLIN-TAZOBACTAM 3.375 G IVPB
3.3750 g | Freq: Three times a day (TID) | INTRAVENOUS | Status: DC
  Administered 2024-04-04 – 2024-04-08 (×11): 3.375 g via INTRAVENOUS
  Filled 2024-04-04 (×11): qty 50

## 2024-04-04 MED ORDER — PROCHLORPERAZINE EDISYLATE 10 MG/2ML IJ SOLN
5.0000 mg | INTRAMUSCULAR | Status: DC | PRN
  Filled 2024-04-04: qty 2

## 2024-04-04 MED ORDER — CALCIUM POLYCARBOPHIL 625 MG PO TABS
625.0000 mg | ORAL_TABLET | Freq: Two times a day (BID) | ORAL | Status: DC
  Administered 2024-04-05 – 2024-04-08 (×6): 625 mg via ORAL
  Filled 2024-04-04 (×8): qty 1

## 2024-04-04 MED ORDER — LACTATED RINGERS IV SOLN: INTRAVENOUS | Status: DC

## 2024-04-04 MED ORDER — ENOXAPARIN SODIUM 40 MG/0.4ML IJ SOSY
40.0000 mg | PREFILLED_SYRINGE | INTRAMUSCULAR | Status: DC
  Filled 2024-04-04: qty 0.4

## 2024-04-04 MED ORDER — MENTHOL 3 MG MT LOZG: 1.0000 | LOZENGE | OROMUCOSAL | Status: DC | PRN

## 2024-04-04 MED ORDER — METHOCARBAMOL 1000 MG/10ML IJ SOLN
1000.0000 mg | Freq: Four times a day (QID) | INTRAMUSCULAR | Status: DC | PRN
  Administered 2024-04-06: 1000 mg via INTRAVENOUS
  Filled 2024-04-04: qty 10

## 2024-04-04 MED ORDER — ONDANSETRON HCL 4 MG/2ML IJ SOLN
4.0000 mg | Freq: Four times a day (QID) | INTRAMUSCULAR | Status: DC | PRN
  Administered 2024-04-05: 4 mg via INTRAVENOUS
  Filled 2024-04-04: qty 2

## 2024-04-04 MED ORDER — DEXTROSE IN LACTATED RINGERS 5 % IV SOLN: INTRAVENOUS | Status: AC

## 2024-04-04 MED ORDER — NAPHAZOLINE-GLYCERIN 0.012-0.25 % OP SOLN: 1.0000 [drp] | Freq: Four times a day (QID) | OPHTHALMIC | Status: DC | PRN

## 2024-04-04 MED ORDER — METHOCARBAMOL 500 MG PO TABS: 1000.0000 mg | ORAL_TABLET | Freq: Four times a day (QID) | ORAL | Status: DC | PRN

## 2024-04-04 MED ORDER — SODIUM CHLORIDE 0.9% FLUSH: 3.0000 mL | INTRAVENOUS | Status: DC | PRN

## 2024-04-04 MED ORDER — SODIUM CHLORIDE 0.9% FLUSH
3.0000 mL | Freq: Two times a day (BID) | INTRAVENOUS | Status: DC
  Administered 2024-04-04 – 2024-04-08 (×7): 3 mL via INTRAVENOUS

## 2024-04-04 MED ORDER — ACETAMINOPHEN 650 MG RE SUPP: 650.0000 mg | Freq: Four times a day (QID) | RECTAL | Status: DC | PRN

## 2024-04-04 MED ORDER — LACTATED RINGERS IV BOLUS
1000.0000 mL | Freq: Once | INTRAVENOUS | Status: AC
  Administered 2024-04-04: 1000 mL via INTRAVENOUS

## 2024-04-04 MED ORDER — ACETAMINOPHEN 325 MG PO TABS: 650.0000 mg | ORAL_TABLET | Freq: Four times a day (QID) | ORAL | Status: DC | PRN

## 2024-04-04 MED ORDER — LORAZEPAM 1 MG PO TABS
1.0000 mg | ORAL_TABLET | Freq: Four times a day (QID) | ORAL | Status: DC | PRN
  Administered 2024-04-04 – 2024-04-05 (×2): 1 mg via ORAL
  Filled 2024-04-04 (×2): qty 1

## 2024-04-04 MED ORDER — SODIUM CHLORIDE 0.9 % IV SOLN: 250.0000 mL | INTRAVENOUS | Status: AC | PRN

## 2024-04-04 MED ORDER — ONDANSETRON HCL 4 MG/2ML IJ SOLN
4.0000 mg | Freq: Once | INTRAMUSCULAR | Status: AC
  Administered 2024-04-04: 4 mg via INTRAVENOUS
  Filled 2024-04-04: qty 2

## 2024-04-04 MED ORDER — SALINE SPRAY 0.65 % NA SOLN: 1.0000 | Freq: Four times a day (QID) | NASAL | Status: DC | PRN

## 2024-04-04 MED ORDER — ALUM & MAG HYDROXIDE-SIMETH 200-200-20 MG/5ML PO SUSP: 30.0000 mL | Freq: Four times a day (QID) | ORAL | Status: DC | PRN

## 2024-04-04 MED ORDER — HYDROMORPHONE HCL 1 MG/ML IJ SOLN: 0.5000 mg | INTRAMUSCULAR | Status: DC | PRN

## 2024-04-04 MED ORDER — DIVALPROEX SODIUM 250 MG PO DR TAB
1000.0000 mg | DELAYED_RELEASE_TABLET | Freq: Two times a day (BID) | ORAL | Status: DC
  Administered 2024-04-04 – 2024-04-08 (×8): 1000 mg via ORAL
  Filled 2024-04-04 (×3): qty 4
  Filled 2024-04-04: qty 2
  Filled 2024-04-04 (×4): qty 4

## 2024-04-04 MED ORDER — BISACODYL 10 MG RE SUPP: 10.0000 mg | Freq: Every day | RECTAL | Status: DC

## 2024-04-04 MED ORDER — MORPHINE SULFATE (PF) 4 MG/ML IV SOLN
4.0000 mg | Freq: Once | INTRAVENOUS | Status: AC
  Administered 2024-04-04: 4 mg via INTRAVENOUS
  Filled 2024-04-04: qty 1

## 2024-04-04 MED ORDER — TRAZODONE HCL 50 MG PO TABS
50.0000 mg | ORAL_TABLET | Freq: Every day | ORAL | Status: DC
  Administered 2024-04-04 – 2024-04-07 (×4): 50 mg via ORAL
  Filled 2024-04-04 (×4): qty 1

## 2024-04-04 MED ORDER — HYDROMORPHONE HCL 1 MG/ML IJ SOLN
0.5000 mg | INTRAMUSCULAR | Status: DC | PRN
  Administered 2024-04-05: 0.5 mg via INTRAVENOUS
  Administered 2024-04-05: 1 mg via INTRAVENOUS
  Filled 2024-04-04 (×2): qty 1

## 2024-04-04 MED ORDER — GABAPENTIN 100 MG PO CAPS
200.0000 mg | ORAL_CAPSULE | Freq: Three times a day (TID) | ORAL | Status: DC
  Administered 2024-04-04: 200 mg via ORAL
  Filled 2024-04-04: qty 2

## 2024-04-04 MED ORDER — KETOROLAC TROMETHAMINE 15 MG/ML IJ SOLN: 15.0000 mg | Freq: Four times a day (QID) | INTRAMUSCULAR | Status: DC | PRN

## 2024-04-04 NOTE — ED Triage Notes (Signed)
 Patient BIB EMS from home c/o RLQ abdominal pain. Patient report nausea and vomiting. Patient unable to keep anything down today. Patient report taking antibiotic and pain medication with no relief. Patient was discharge yesterday. Hx appendicitis , scheduled surgery in 6 weeks.

## 2024-04-04 NOTE — H&P (Signed)
 " History and Physical    Ronald [name] FMW:[id number] DOB: February 03, 1981 DOA: 04/04/2024  PCP: Joshua Debby CROME, MD   Patient coming from: Home   Chief Complaint:  Chief Complaint  Patient presents with   Abdominal Pain   ED TRIAGE note:  HPI:  Ronald [name] is a 43 y.o. male with medical history significant of PTSD, bipolar disorder, insomnia, chronic anxiety and benzodiazepine dependence presented to emergency department with complaining of continuation of right lower quadrant diverticular pain in the setting of acute appendicitis.  Patient was admitted 4 days ago and discharged home after being discussed with general surgery being planned to to treat conservatively as repeat CT abdomen pelvis did not show any evidence of abdominal abscess/perforation.  Patient was initially treated with IV antibiotics and transition to Augmentin  to complete 7 to 10 days course and plan for outpatient schedule/surgery in 6-week.  Again patient presented emergency department by EMS with complaining of  right lower quad abdominal pain associated nausea vomiting and poor oral tolerance.  Patient reported being compliant with antibiotics and pain medication without any relief.  ED Course: At presentation to ED patient is hemodynamically stable.  Lab work, CMP showing low sodium 134 otherwise unremarkable.  CBC unremarkable evidence of leukocytosis.  Pending x-ray abdomen. Per chart review of CT abdomen pelvis from 12/19 changes consistent acute appendicitis with minimal decrease in inflammatory changes.  Finding not suggestive of perforation.  ED physician consulted spoke with general surgery recommended admission under medicine service.  Dr. Sheldon will evaluate patient soon. As needed surgery ordered IV Zosyn , IV pain medications as needed and currently patient is on clear liquid diet.  Hospitalist consulted for for admission for comanagement of chronic appendicitis with general surgery  team.   Significant labs in the ED: Lab Orders         Comprehensive metabolic panel         CBC         Urinalysis, Routine w reflex microscopic -Urine, Clean Catch         CBC         Basic metabolic panel with GFR       Review of Systems:  Review of Systems  Constitutional:  Negative for chills, fever, malaise/fatigue and weight loss.  Cardiovascular:  Negative for chest pain.  Gastrointestinal:  Positive for abdominal pain, nausea and vomiting. Negative for constipation and diarrhea.  Neurological:  Negative for dizziness and headaches.  Psychiatric/Behavioral:  The patient is nervous/anxious.     Past Medical History:  Diagnosis Date   Anxiety    Arthritis    Disorder of pineal gland    Post traumatic stress disorder    Schizoaffective disorder, bipolar type (HCC) 08/29/2023   Severe manic bipolar 1 disorder with psychotic behavior (HCC) 08/29/2023    Past Surgical History:  Procedure Laterality Date   ANKLE SURGERY       reports that he has never smoked. His smokeless tobacco use includes chew. He reports current drug use. Drug: Marijuana. He reports that he does not drink alcohol.  Allergies[1]  History reviewed. No pertinent family history.  Prior to Admission medications  Medication Sig Start Date End Date Taking? Authorizing Provider  acetaminophen  (TYLENOL ) 325 MG tablet Take 2 tablets (650 mg total) by mouth every 6 (six) hours as needed for mild pain (pain score 1-3), fever or headache. 04/03/24   Barbarann Nest, MD  amoxicillin -clavulanate (AUGMENTIN ) 875-125 MG tablet Take 1 tablet by mouth 2 (two)  times daily for 14 days. 04/03/24 04/17/24  Barbarann Nest, MD  divalproex  (DEPAKOTE ) 500 MG DR tablet Take 2 tablets (1,000 mg total) by mouth 2 (two) times daily. Patient taking differently: Take 1,000 mg by mouth See admin instructions. Take 1,000 mg by mouth at 8 AM and 5 PM 03/04/24   Joshua Debby CROME, MD  gabapentin  (NEURONTIN ) 100 MG capsule Take 2  capsules (200 mg total) by mouth 3 (three) times daily. Patient taking differently: Take 200 mg by mouth See admin instructions. Take 200 mg by mouth in the morning, at midday, and 5 PM 03/17/24   Joshua Debby CROME, MD  ibuprofen  (ADVIL ) 200 MG tablet Take 800 mg by mouth daily as needed (for pain or headaches).    [provider]  Multiple Vitamins-Minerals (MENS MULTIVITAMIN) TABS Take 1 tablet by mouth every 7 (seven) days.    [provider]  ondansetron  (ZOFRAN ) 4 MG tablet Take 1 tablet (4 mg total) by mouth daily as needed for nausea or vomiting. 04/03/24 04/03/25  Barbarann Nest, MD  oxyCODONE  (OXY IR/ROXICODONE ) 5 MG immediate release tablet Take 1-2 tablets (5-10 mg total) by mouth every 4 (four) hours as needed for severe pain (pain score 7-10) or moderate pain (pain score 4-6). 04/03/24   Barbarann Nest, MD  traZODone  (DESYREL ) 50 MG tablet Take 1 tablet (50 mg total) by mouth at bedtime. 03/04/24   Joshua Debby CROME, MD     Physical Exam: Vitals:   04/04/24 2235 04/05/24 0326 04/05/24 0551 04/05/24 0638  BP: 109/72 (!) 102/56 107/78   Pulse: (!) 52 (!) 51 (!) 47   Resp: 17 14 12    Temp: 97.7 F (36.5 C) (!) 97.5 F (36.4 C) 98.4 F (36.9 C)   TempSrc: Oral  Oral   SpO2: 98% 96%    Height:    5' 11 (1.803 m)    Physical Exam Constitutional:      Appearance: He is well-developed.  Cardiovascular:     Rate and Rhythm: Normal rate and regular rhythm.  Abdominal:     General: Abdomen is flat. Bowel sounds are normal. There is no distension. There are no signs of injury.     Palpations: Abdomen is soft.     Tenderness: There is abdominal tenderness in the right lower quadrant. There is no guarding or rebound.  Skin:    Capillary Refill: Capillary refill takes less than 2 seconds.  Neurological:     Mental Status: He is alert and oriented to person, place, and time.      Labs on Admission: I have personally reviewed following labs and imaging  studies  CBC: Recent Labs  Lab 03/30/24 0536 03/31/24 0633 04/01/24 0541 04/04/24 1657 04/05/24 0636  WBC 6.7 5.6 4.8 5.4 4.1  HGB 13.1 15.0 13.7 14.2 13.4  HCT 37.0* 44.7 40.7 40.6 39.2  MCV 88.9 91.0 90.8 88.5 89.7  PLT 236 267 265 243 194   Basic Metabolic Panel: Recent Labs  Lab 03/29/24 1309 03/30/24 0536 04/01/24 0541 04/04/24 1657  NA 137 137 142 134*  K 3.7 4.4 5.1 4.5  CL 96* 101 102 99  CO2 25 29 32 25  GLUCOSE 137* 98 85 108*  BUN 11 10 8 10   CREATININE 0.93 0.97 1.12 0.82  CALCIUM  10.5* 9.0 9.4 9.1  MG  --  2.1  --   --   PHOS  --  3.8  --   --    GFR: Estimated Creatinine Clearance: 123.7 mL/min (  by C-G formula based on SCr of 0.82 mg/dL). Liver Function Tests: Recent Labs  Lab 03/29/24 1309 03/30/24 0536 04/04/24 1657  AST 19 15 29   ALT 13 8 19   ALKPHOS 59 39 44  BILITOT 0.5 0.3 0.3  PROT 9.1* 6.0* 6.8  ALBUMIN 5.1* 3.6 4.2   Recent Labs  Lab 03/29/24 1309  LIPASE 17   No results for input(s): AMMONIA in the last 168 hours. Coagulation Profile: No results for input(s): INR, PROTIME in the last 168 hours. Cardiac Enzymes: No results for input(s): CKTOTAL, CKMB, CKMBINDEX, TROPONINI, TROPONINIHS in the last 168 hours. BNP (last 3 results) No results for input(s): BNP in the last 8760 hours. HbA1C: No results for input(s): HGBA1C in the last 72 hours. CBG: No results for input(s): GLUCAP in the last 168 hours. Lipid Profile: No results for input(s): CHOL, HDL, LDLCALC, TRIG, CHOLHDL, LDLDIRECT in the last 72 hours. Thyroid  Function Tests: No results for input(s): TSH, T4TOTAL, FREET4, T3FREE, THYROIDAB in the last 72 hours. Anemia Panel: No results for input(s): VITAMINB12, FOLATE, FERRITIN, TIBC, IRON, RETICCTPCT in the last 72 hours. Urine analysis:    Component Value Date/Time   COLORURINE AMBER (A) 04/04/2024 1935   APPEARANCEUR CLEAR 04/04/2024 1935   LABSPEC 1.032 (H)  04/04/2024 1935   PHURINE 5.0 04/04/2024 1935   GLUCOSEU NEGATIVE 04/04/2024 1935   HGBUR NEGATIVE 04/04/2024 1935   BILIRUBINUR NEGATIVE 04/04/2024 1935   KETONESUR 5 (A) 04/04/2024 1935   PROTEINUR 30 (A) 04/04/2024 1935   UROBILINOGEN 0.2 08/08/2013 1342   NITRITE NEGATIVE 04/04/2024 1935   LEUKOCYTESUR TRACE (A) 04/04/2024 1935    Radiological Exams on Admission: I have personally reviewed images DG ABD ACUTE 2+V W 1V CHEST Result Date: 04/05/2024 CLINICAL DATA:  Evaluate for free air, history of right lower quadrant pain EXAM: DG ABDOMEN ACUTE WITH 1 VIEW CHEST COMPARISON:  CT from 04/02/2024 FINDINGS: Cardiac shadow is within normal limits. The lungs are well aerated bilaterally. No focal infiltrate or effusion is seen. Scattered large and small bowel gas is noted. No obstructive changes are seen. No mass effect is noted. No free air is noted. No bony abnormality is seen. IMPRESSION: No acute abnormality in the chest and abdomen. Specifically no findings to suggest free air are noted. Electronically Signed   By: Oneil Devonshire M.D.   On: 04/05/2024 00:39       Assessment/Plan: Principal Problem:   Chronic appendicitis Active Problems:   GAD (generalized anxiety disorder)   Bipolar 1 disorder with moderate mania (HCC)   PTSD (post-traumatic stress disorder)   Multiple drug allergies   History of bipolar disorder    Assessment and Plan: Chronic appendicitis Abdominal pain secondary to appendicitis -Patient presented emergency department with complaining of continuation of right lower quadrant abdominal pain associate nausea vomiting poor oral intake.  Patient being admitted 1 week ago for acute appendicitis without any evidence of perforation or abscess formation.  Patient was being treated with IV antibiotic later on transition to oral antibiotic and plan for outpatient follow-up with general surgery for elective surgery in 6 weeks however patient presenting today with similar  complaints. - Hemodynamically stable.  Afebrile.  CBC no evidence of leukocytosis. - Physical exam reveals minimal tenderness of right lower quadrant with deep palpation.  No evidence of guarding, rebound and rigidity.  No acute abdominal sign.  Patient denies any nausea or vomiting currently. - EDP discussed with general surgery Dr. Sheldon recommended x-ray abdomen and general surgery already placed  all the imaging and IV antibiotics and as needed pain medications as well on the chart. - Continue IV Zosyn  per general surgery recommendation - Continue pain management per general surgery commendation - In the ED patient received 1 L of LR bolus, morphine  4 mg.  Continue liquid diet.   -Continue to monitor development of acute abdominal sign.  If patient develops any acute abdominal sign need to obtain a stat CT abdomen pelvis. Addendum - X-ray abdomen no finding suggestive of free air.    History of PTSD History of bipolar disorder History of generalized anxiety disorder -Continue Depakote  twice daily. -Patient self-reported taking Klonopin  requesting for similar medication however unable to find out on patient's home medication list and [place]  PDMP med list as well. -Ordering Ativan  1 mg every 6 as needed for anxiety management.  Insomnia -Continue trazodone .   DVT prophylaxis:  Lovenox  Code Status:  Full Code Diet: Clear liquid diet. Disposition Plan: Continue to monitor improvement of pain and development of any acute abdominal sign. Consults: Surgery Admission status:   Inpatient, Telemetry bed  Severity of Illness: The appropriate patient status for this patient is INPATIENT. Inpatient status is judged to be reasonable and necessary in order to provide the required intensity of service to ensure the patient's safety. The patient's presenting symptoms, physical exam findings, and initial radiographic and laboratory data in the context of their chronic comorbidities is felt  to place them at high risk for further clinical deterioration. Furthermore, it is not anticipated that the patient will be medically stable for discharge from the hospital within 2 midnights of admission.   * I certify that at the point of admission it is my clinical judgment that the patient will require inpatient hospital care spanning beyond 2 midnights from the point of admission due to high intensity of service, high risk for further deterioration and high frequency of surveillance required.Ronald    Advaith Lamarque, MD [facility]  How to contact the [facility] Attending or Consulting provider 7A - 7P or covering provider during after hours 7P -7A, for this patient.  Check the care team in [facility] and look for a) attending/consulting [facility] provider listed and b) the [facility] team listed Log into [email or web address] and use [place]'s universal password to access. If you do not have the password, please contact the hospital operator. Locate the [facility] provider you are looking for under [facility] and page to a number that you can be directly reached. If you still have difficulty reaching the provider, please page the [facility] (Director on Call) for the Hospitalists listed on amion for assistance.  04/05/2024, 6:59 AM           [1]  Allergies Allergen Reactions   Vistaril  [Hydroxyzine ] Itching, Anxiety and Other (See Comments)    Hyperactivity    Alupent [Metaproterenol] Other (See Comments)    Hyperactivity   Seroquel  [Quetiapine ] Other (See Comments)    Hallucinations    Zyprexa  [Olanzapine ] Other (See Comments)    made patient feel weird   Antihistamines, Chlorpheniramine-Type Anxiety and Other (See Comments)    made patient feel insane - all antihistamines   Antihistamines, Diphenhydramine -Type Anxiety and Other (See Comments)    made patient feel insane - all antihistamines   Antihistamines, Loratadine-Type Anxiety and Other (See Comments)    made patient feel insane - all  antihistamines   Clonidine And Derivatives Anxiety and Other (See Comments)    Caused a red/flushed face and had a panic attack   "

## 2024-04-04 NOTE — ED Provider Notes (Signed)
 " [place] EMERGENCY DEPARTMENT AT [facility] Provider Note   CSN: [id number] Arrival date & time: 04/04/24  1616     Patient presents with: Abdominal Pain   Ronald [name] is a 43 y.o. male presents today for right lower quadrant abdominal pain.  Patient was admitted 6 days ago for acute appendicitis with antibiotic treatment at that time given surgery would be very involved and also likely include partial resection of his bowel.  Patient reports nausea and vomiting.  Patient has been taking his antibiotic and pain medicine without relief.    Abdominal Pain Associated symptoms: nausea and vomiting        Prior to Admission medications  Medication Sig Start Date End Date Taking? Authorizing Provider  acetaminophen  (TYLENOL ) 325 MG tablet Take 2 tablets (650 mg total) by mouth every 6 (six) hours as needed for mild pain (pain score 1-3), fever or headache. 04/03/24   Barbarann Nest, MD  amoxicillin -clavulanate (AUGMENTIN ) 875-125 MG tablet Take 1 tablet by mouth 2 (two) times daily for 14 days. 04/03/24 04/17/24  Barbarann Nest, MD  divalproex  (DEPAKOTE ) 500 MG DR tablet Take 2 tablets (1,000 mg total) by mouth 2 (two) times daily. Patient taking differently: Take 1,000 mg by mouth See admin instructions. Take 1,000 mg by mouth at 8 AM and 5 PM 03/04/24   Joshua Debby CROME, MD  gabapentin  (NEURONTIN ) 100 MG capsule Take 2 capsules (200 mg total) by mouth 3 (three) times daily. Patient taking differently: Take 200 mg by mouth See admin instructions. Take 200 mg by mouth in the morning, at midday, and 5 PM 03/17/24   Joshua Debby CROME, MD  ibuprofen  (ADVIL ) 200 MG tablet Take 800 mg by mouth daily as needed (for pain or headaches).    [provider]  Multiple Vitamins-Minerals (MENS MULTIVITAMIN) TABS Take 1 tablet by mouth every 7 (seven) days.    [provider]  ondansetron  (ZOFRAN ) 4 MG tablet Take 1 tablet (4 mg total) by mouth daily as needed for  nausea or vomiting. 04/03/24 04/03/25  Barbarann Nest, MD  oxyCODONE  (OXY IR/ROXICODONE ) 5 MG immediate release tablet Take 1-2 tablets (5-10 mg total) by mouth every 4 (four) hours as needed for severe pain (pain score 7-10) or moderate pain (pain score 4-6). 04/03/24   Barbarann Nest, MD  traZODone  (DESYREL ) 50 MG tablet Take 1 tablet (50 mg total) by mouth at bedtime. 03/04/24   Joshua Debby CROME, MD    Allergies: Vistaril  [hydroxyzine ]; Alupent [metaproterenol]; Seroquel  [quetiapine ]; Zyprexa  [olanzapine ]; Antihistamines, chlorpheniramine-type; Antihistamines, diphenhydramine -type; Antihistamines, loratadine-type; and Clonidine and derivatives    Review of Systems  Gastrointestinal:  Positive for abdominal pain, nausea and vomiting.    Updated Vital Signs BP (!) 124/95   Pulse 70   Temp 98 F (36.7 C)   Resp 16   SpO2 100%   Physical Exam Vitals and nursing note reviewed.  Constitutional:      General: He is not in acute distress.    Appearance: He is well-developed. He is ill-appearing.  HENT:     Head: Normocephalic and atraumatic.  Eyes:     Conjunctiva/sclera: Conjunctivae normal.  Cardiovascular:     Rate and Rhythm: Normal rate and regular rhythm.     Heart sounds: Normal heart sounds. No murmur heard. Pulmonary:     Effort: Pulmonary effort is normal. No respiratory distress.     Breath sounds: Normal breath sounds.  Abdominal:     General: There is no distension.  Palpations: Abdomen is soft.     Tenderness: There is abdominal tenderness in the right lower quadrant. Positive signs include McBurney's sign.  Musculoskeletal:        General: No swelling.     Cervical back: Neck supple.  Skin:    General: Skin is warm and dry.     Capillary Refill: Capillary refill takes less than 2 seconds.     Coloration: Skin is pale.  Neurological:     Mental Status: He is alert.  Psychiatric:        Mood and Affect: Mood normal.     (all labs ordered are listed, but  only abnormal results are displayed) Labs Reviewed  COMPREHENSIVE METABOLIC PANEL WITH GFR - Abnormal; Notable for the following components:      Result Value   Sodium 134 (*)    Glucose, Bld 108 (*)    All other components within normal limits  CBC  URINALYSIS, ROUTINE W REFLEX MICROSCOPIC  CBC  BASIC METABOLIC PANEL WITH GFR    EKG: None  Radiology: No results found.   Procedures   Medications Ordered in the ED  lactated ringers  bolus 1,000 mL (has no administration in time range)  piperacillin -tazobactam (ZOSYN ) IVPB 3.375 g (has no administration in time range)  acetaminophen  (TYLENOL ) tablet 1,000 mg (has no administration in time range)  ketorolac  (TORADOL ) 15 MG/ML injection 15-30 mg (has no administration in time range)  HYDROmorphone  (DILAUDID ) injection 0.5-2 mg (has no administration in time range)  methocarbamol  (ROBAXIN ) injection 1,000 mg (has no administration in time range)  methocarbamol  (ROBAXIN ) tablet 1,000 mg (has no administration in time range)  ondansetron  (ZOFRAN ) injection 4 mg (has no administration in time range)  prochlorperazine  (COMPAZINE ) injection 5-10 mg (has no administration in time range)  phenol (CHLORASEPTIC) mouth spray 2 spray (has no administration in time range)  menthol  (CEPACOL) lozenge 3 mg (has no administration in time range)  alum & mag hydroxide-simeth (MAALOX/MYLANTA) 200-200-20 MG/5ML suspension 30 mL (has no administration in time range)  bisacodyl  (DULCOLAX) suppository 10 mg (has no administration in time range)  polycarbophil (FIBERCON) tablet 625 mg (has no administration in time range)  naphazoline-glycerin  (CLEAR EYES REDNESS) ophth solution 1-2 drop (has no administration in time range)  sodium chloride  (OCEAN) 0.65 % nasal spray 1-2 spray (has no administration in time range)  gabapentin  (NEURONTIN ) capsule 200 mg (has no administration in time range)  traZODone  (DESYREL ) tablet 50 mg (has no administration in time  range)  morphine  (PF) 4 MG/ML injection 4 mg (4 mg Intravenous Given 04/04/24 1843)  ondansetron  (ZOFRAN ) injection 4 mg (4 mg Intravenous Given 04/04/24 1843)  lactated ringers  bolus 1,000 mL (1,000 mLs Intravenous New Bag/Given 04/04/24 1908)                                    Medical Decision Making Amount and/or Complexity of Data Reviewed Labs: ordered.   lalThis patient presents to the ED for concern of RLQ abd pain with N/V differential diagnosis includes appendicitis, choledocholithiasis, acute cholecystitis, SBO, diverticulitis, pancreatitis    Additional history obtained   Additional history obtained from Electronic Medical Record External records from outside source obtained and reviewed including previous admission notes   Lab Tests:  I Ordered, and personally interpreted labs.  The pertinent results include: Mild hyponatremia 134, CBC unremarkable   Imaging Studies ordered:  I ordered imaging studies including acute abdominal series  I independently visualized and interpreted imaging which showed Pending I agree with the radiologist interpretation   Medicines ordered and prescription drug management:  I ordered medication including morphine  and Zofran , Zosyn     I have reviewed the patients home medicines and have made adjustments as needed   Problem List / ED Course:  Consulted general surgery, Dr. Sheldon who recommended IV antibiotics, acute abdominal series, and admission. Consulted hospitalist, Dr. Sundil who is agreeable to admission        Final diagnoses:  Acute appendicitis, unspecified acute appendicitis type    ED Discharge Orders     None          Francis Ileana SAILOR, PA-C 04/04/24 1918  "

## 2024-04-04 NOTE — Progress Notes (Addendum)
 "      [name]  [birth year] [id number]  CARE TEAM:  PCP: Joshua Debby CROME, MD  Outpatient Care Team: Patient Care Team: Joshua Debby CROME, MD as PCP - General (Internal Medicine)  Inpatient Treatment Team: Treatment Team:  [name], MD [name] Kristopher, Md, MD [place], [name], [place] Ron PARAS, [place] [name], MD [name], RN Delores Rattan, RN    Patient with history of bipolar disorder, schizoaffective disorder, substance use, anxiety, PTSD.  Had fall and had incidentally noted dilated appendix 02/07/2024.  Not triggered symptomatic at that time.  However had some symptoms complaints and was admitted to medicine for right lower quadrant ileocecal inflammation.  Suspected appendicitis.  Never had fever elevated white count.  Stabilized on antibiotics.  Follow-up CAT scan done just 2 days ago with no worsening abscess formation or obstruction or other concern.  Patient was discharged yesterday tolerating some solid food.  Felt cramping this morning again which concerned him and came to the ER.  No fever.  No white count.  No tachycardia or shock.  Patient notes when he got home he just did not feel motivated to eat or drink.  Disheartened.  Denied any definite vomiting.  Did have a bowel movement.  Has been trying some water  and chips.  Had been taking his Augmentin  antibiotics.     Assessment   Problem List:  Principal Problem:   Chronic appendicitis Active Problems:   GAD (generalized anxiety disorder)   Schizophrenia, paranoid type (HCC)   Substance induced mood disorder (HCC)   Mild tetrahydrocannabinol (THC) abuse   Bipolar 1 disorder with moderate mania (HCC)   PTSD (post-traumatic stress disorder)   Multiple drug allergies   Chronic pancreatitis (HCC)   History of bipolar disorder   His exam is rather underwhelming.  Again presentation has been very atypical and there is no evidence of any worsening infection at this  time.  Started with an incidentally dilated appendix in October.  No worsening inflammation or abscess on repeat CT scan with 5 days of antibiotics.    Reasonable to do 3 view chest/abdominal x-rays to rule out obstruction or free air.  Does not seem that likely.  If there is evidence of dilated bowel with worsening ileus or obstruction and he has repeat emesis, place NG tube to low intermittent wall suction.  IV resuscitation.  Nausea and pain control.  Patient claims intermittent emesis but I do not see it witnessed in the last hospitalization.  Apply strict I's and O's just in case.  If he truly is having emesis then consider NG tube  Switch to piperacillin /tazobactam for antibiotic coverage since patient not fully resolved with ceftriaxone /metronidazole   Hopefully medicine can manage his anxiety and mood and psychiatric issues.  Those issues make a challenge for symptom control.  I think he supposed to be on benzodiazepines but do not know how compliant he has been with that or if they were trying to switch him off it.  He would be a challenge if comes to surgery.  Probably challenged with his fair oral intake.  See if medicine can stabilize.  Consider repeat CT scan if does not turnaround with improved pain control with full oral and IV contrast.  If pain not controlled or he deteriorates, may require operative exploration with appendectomy possible ileocolectomy.  Trying to wait 6 weeks for more interval situation.  Given his persistent complaints and anxiety may need to just proceed  but not ideal.  Surgery will follow and reevaluate.  There is no hard indication for surgery tonight.  Surgery will help follow.    Elspeth KYM Schultze, MD, FACS, MASCRS Esophageal, Gastrointestinal & Colorectal Surgery Robotic and Minimally Invasive Surgery  [facility] [address] [place], [name] [address] [phone] Fax [phone] Main  CONTACT INFORMATION: Weekday (9AM-5PM): Call [facility] main office at [phone] Weeknight (5PM-9AM) or Weekend/Holiday: Check EPIC Web Links tab & use AMION (password  [email or web address]) for General Surgery [facility] coverage  Please, DO NOT use SecureChat  (it is not reliable communication to reach operating surgeons & will lead to a delay in care).   Epic staff messaging available for outpatient concerns needing 1-2 business day response.      04/04/2024     ########################################################################################################    Past Medical History:  Diagnosis Date   Anxiety    Arthritis    Disorder of pineal gland    Post traumatic stress disorder    Schizoaffective disorder, bipolar type (HCC) 08/29/2023   Severe manic bipolar 1 disorder with psychotic behavior (HCC) 08/29/2023    Past Surgical History:  Procedure Laterality Date   ANKLE SURGERY      Social History   Socioeconomic History   Marital status: Single    Spouse name: Not on file   Number of children: Not on file   Years of education: Not on file   Highest education level: Not on file  Occupational History   Not on file  Tobacco Use   Smoking status: Never   Smokeless tobacco: Current    Types: Chew  Vaping Use   Vaping status: Never Used  Substance and Sexual Activity   Alcohol use: No   Drug use: Yes    Types: Marijuana    Comment: one year ago   Sexual activity: Not Currently  Other Topics Concern   Not on file  Social History Narrative   Not on file   Social Drivers of Health   Tobacco Use: High Risk (04/04/2024)   Patient History    Smoking Tobacco Use: Never    Smokeless Tobacco Use: Current    Passive Exposure: Not on file  Financial Resource Strain: Not on file  Food Insecurity: Food Insecurity Present (03/30/2024)   Epic    Worried About Programme Researcher, Broadcasting/film/video in the Last Year: Sometimes true    [employer] of Food in the Last Year:  Sometimes true  Transportation Needs: No Transportation Needs (03/30/2024)   Epic    Lack of Transportation (Medical): No    Lack of Transportation (Non-Medical): No  Physical Activity: Not on file  Stress: Not on file  Social Connections: Not on file  Intimate Partner Violence: Not At Risk (03/30/2024)   Epic    Fear of Current or Ex-Partner: No    Emotionally Abused: No    Physically Abused: No    Sexually Abused: No  Depression (PHQ2-9): Low Risk (03/04/2024)   Depression (PHQ2-9)    PHQ-2 Score: 0  Alcohol Screen: Low Risk (12/27/2023)   Alcohol Screen    Last Alcohol Screening Score (AUDIT): 0  Housing: Low Risk (03/30/2024)   Epic    Unable to Pay for Housing in the Last Year: No    Number of Times Moved in the Last Year: 0    Homeless in the Last Year: No  Utilities: Not At Risk (03/30/2024)   Epic  Threatened with loss of utilities: No  Health Literacy: Not on file    History reviewed. No pertinent family history.  Current Facility-Administered Medications  Medication Dose Route Frequency Provider Last Rate Last Admin   acetaminophen  (TYLENOL ) tablet 1,000 mg  1,000 mg Oral Q6H Sheldon Standing, MD       alum & mag hydroxide-simeth (MAALOX/MYLANTA) 200-200-20 MG/5ML suspension 30 mL  30 mL Oral Q6H PRN Sheldon Standing, MD       bisacodyl  (DULCOLAX) suppository 10 mg  10 mg Rectal Daily Sheldon Standing, MD       gabapentin  (NEURONTIN ) capsule 200 mg  200 mg Oral TID Sheldon Standing, MD       HYDROmorphone  (DILAUDID ) injection 0.5-2 mg  0.5-2 mg Intravenous Q2H PRN Sheldon Standing, MD       ketorolac  (TORADOL ) 15 MG/ML injection 15-30 mg  15-30 mg Intravenous Q6H PRN Sheldon Standing, MD       lactated ringers  bolus 1,000 mL  1,000 mL Intravenous Q8H PRN Sheldon Standing, MD       menthol  (CEPACOL) lozenge 3 mg  1 lozenge Oral PRN Sheldon Standing, MD       methocarbamol  (ROBAXIN ) injection 1,000 mg  1,000 mg Intravenous Q6H PRN Sheldon Standing, MD       methocarbamol  (ROBAXIN ) tablet  1,000 mg  1,000 mg Oral Q6H PRN Sheldon Standing, MD       naphazoline-glycerin  (CLEAR EYES REDNESS) ophth solution 1-2 drop  1-2 drop Both Eyes QID PRN Sheldon Standing, MD       ondansetron  (ZOFRAN ) injection 4 mg  4 mg Intravenous Q6H PRN Sheldon Standing, MD       phenol (CHLORASEPTIC) mouth spray 2 spray  2 spray Mouth/Throat PRN Sheldon Standing, MD       piperacillin -tazobactam (ZOSYN ) IVPB 3.375 g  3.375 g Intravenous Q8H Ragina Fenter, MD       polycarbophil (FIBERCON) tablet 625 mg  625 mg Oral BID Sheldon Standing, MD       prochlorperazine  (COMPAZINE ) injection 5-10 mg  5-10 mg Intravenous Q4H PRN Sheldon Standing, MD       sodium chloride  (OCEAN) 0.65 % nasal spray 1-2 spray  1-2 spray Each Nare Q6H PRN Sheldon Standing, MD       traZODone  (DESYREL ) tablet 50 mg  50 mg Oral QHS Tayshawn Purnell, MD       Current Outpatient Medications  Medication Sig Dispense Refill   acetaminophen  (TYLENOL ) 325 MG tablet Take 2 tablets (650 mg total) by mouth every 6 (six) hours as needed for mild pain (pain score 1-3), fever or headache.     amoxicillin -clavulanate (AUGMENTIN ) 875-125 MG tablet Take 1 tablet by mouth 2 (two) times daily for 14 days. 28 tablet 0   divalproex  (DEPAKOTE ) 500 MG DR tablet Take 2 tablets (1,000 mg total) by mouth 2 (two) times daily. (Patient taking differently: Take 1,000 mg by mouth See admin instructions. Take 1,000 mg by mouth at 8 AM and 5 PM) 360 tablet 1   gabapentin  (NEURONTIN ) 100 MG capsule Take 2 capsules (200 mg total) by mouth 3 (three) times daily. (Patient taking differently: Take 200 mg by mouth See admin instructions. Take 200 mg by mouth in the morning, at midday, and 5 PM) 540 capsule 1   ibuprofen  (ADVIL ) 200 MG tablet Take 800 mg by mouth daily as needed (for pain or headaches).     Multiple Vitamins-Minerals (MENS MULTIVITAMIN) TABS Take 1 tablet by mouth every 7 (seven) days.  ondansetron  (ZOFRAN ) 4 MG tablet Take 1 tablet (4 mg total) by mouth daily as needed for  nausea or vomiting. 30 tablet 1   oxyCODONE  (OXY IR/ROXICODONE ) 5 MG immediate release tablet Take 1-2 tablets (5-10 mg total) by mouth every 4 (four) hours as needed for severe pain (pain score 7-10) or moderate pain (pain score 4-6). 20 tablet 0   traZODone  (DESYREL ) 50 MG tablet Take 1 tablet (50 mg total) by mouth at bedtime. 90 tablet 1     Allergies[1]   BP (!) 124/95   Pulse 70   Temp 98 F (36.7 C)   Resp 16   SpO2 100%    Constitutional: Not cachectic.  Hygeine adequate.  Vitals signs as above.  Patient seems tired but not toxic or sickly. Eyes: Pupils reactive, normal extraocular movements. Sclera nonicteric Neuro: CN II-XII intact.  No major focal sensory defects.  No major motor deficits. Lymph: No head/neck/groin lymphadenopathy Psych: Somewhat anxious and depressed.  Somewhat avoidant gaze.  Not belligerent.  Respectful saying sir a lot   Judgment & insight Adequate, Oriented x4, HENT: Normocephalic, Mucus membranes moist.  No thrush.   Neck: Supple, No tracheal deviation.  No obvious thyromegaly Chest: No pain to chest wall compression.  Good respiratory excursion.  No audible wheezing CV:  Pulses intact.  regular rhythm.  No major extremity edema  Abdomen:  Flat Hernia: Not present. Diastasis recti: Not present. Soft.   Nondistended.  Nontender.  Patient has hands-on right lower quadrant but has no guarding or peritonitis.  When distracted does not seem to have much in the way of discomfort on exam.  No hepatomegaly.  No splenomegaly  Gen:  Inguinal hernia: Not present.  Inguinal lymph nodes: without lymphadenopathy.    Rectal: (Deferred)  Ext: No obvious deformity or contracture.  Edema: Not present.  No cyanosis Skin: No major subcutaneous nodules.  Warm and dry Musculoskeletal: Severe joint rigidity not present.  No obvious clubbing.  No digital petechiae.       Results:   Labs: Results for orders placed or performed during the hospital encounter of  04/04/24 (from the past 48 hours)  Comprehensive metabolic panel     Status: Abnormal   Collection Time: 04/04/24  4:57 PM  Result Value Ref Range   Sodium 134 (L) 135 - 145 mmol/L   Potassium 4.5 3.5 - 5.1 mmol/L    Comment: HEMOLYSIS AT THIS LEVEL MAY AFFECT RESULT   Chloride 99 98 - 111 mmol/L   CO2 25 22 - 32 mmol/L   Glucose, Bld 108 (H) 70 - 99 mg/dL    Comment: Glucose reference range applies only to samples taken after fasting for at least 8 hours.   BUN 10 6 - 20 mg/dL   Creatinine, Ser 9.17 0.61 - 1.24 mg/dL   Calcium  9.1 8.9 - 10.3 mg/dL   Total Protein 6.8 6.5 - 8.1 g/dL   Albumin 4.2 3.5 - 5.0 g/dL   AST 29 15 - 41 U/L    Comment: HEMOLYSIS AT THIS LEVEL MAY AFFECT RESULT   ALT 19 0 - 44 U/L   Alkaline Phosphatase 44 38 - 126 U/L   Total Bilirubin 0.3 0.0 - 1.2 mg/dL   GFR, Estimated >39 >39 mL/min    Comment: (NOTE) Calculated using the CKD-EPI Creatinine Equation (2021)    Anion gap 10 5 - 15    Comment: Performed at [facility], [address] [place], [name] [address]  CBC  Status: None   Collection Time: 04/04/24  4:57 PM  Result Value Ref Range   WBC 5.4 4.0 - 10.5 K/uL   RBC 4.59 4.22 - 5.81 MIL/uL   Hemoglobin 14.2 13.0 - 17.0 g/dL   HCT 59.3 60.9 - 47.9 %   MCV 88.5 80.0 - 100.0 fL   MCH 30.9 26.0 - 34.0 pg   MCHC 35.0 30.0 - 36.0 g/dL   RDW 87.9 88.4 - 84.4 %   Platelets 243 150 - 400 K/uL   nRBC 0.0 0.0 - 0.2 %    Comment: Performed at [facility], [address], [place], [name] [address]    Imaging / Studies: CT ABDOMEN PELVIS W CONTRAST Result Date: 04/02/2024 CLINICAL DATA:  Right lower quadrant pain for several days, history of abnormal appendix on prior exam. EXAM: CT ABDOMEN AND PELVIS WITH CONTRAST TECHNIQUE: Multidetector CT imaging of the abdomen and pelvis was performed using the standard protocol following bolus administration of intravenous contrast. RADIATION DOSE REDUCTION: This exam was  performed according to the departmental dose-optimization program which includes automated exposure control, adjustment of the mA and/or kV according to patient size and/or use of iterative reconstruction technique. CONTRAST:  OMNIPAQUE  IOHEXOL  300 MG/ML  SOLN COMPARISON:  03/29/2024. FINDINGS: Lower chest: No acute abnormality. Hepatobiliary: No focal liver abnormality is seen. No gallstones, gallbladder wall thickening, or biliary dilatation. Pancreas: Unremarkable. No pancreatic ductal dilatation or surrounding inflammatory changes. Spleen: Normal in size without focal abnormality. Adrenals/Urinary Tract: Adrenal glands are unremarkable. Kidneys are normal, without renal calculi, focal lesion, or hydronephrosis. Bladder is unremarkable. Stomach/Bowel: No obstructive or inflammatory changes of the colon are noted. There remains dilatation of the appendix with surrounding inflammatory change. Some slight decrease in inflammatory change is noted no definitive signs of perforation or abscess formation are noted at this time. Small bowel and stomach are within normal limits. Vascular/Lymphatic: No significant vascular findings are present. No enlarged abdominal or pelvic lymph nodes. Reproductive: Prostate is unremarkable. Other: No abdominal wall hernia or abnormality. No abdominopelvic ascites. Musculoskeletal: No acute or significant osseous findings. IMPRESSION: Changes consistent with acute appendicitis with very minimal decrease in inflammatory changes. No findings to suggest perforation or abscess formation are noted at this time. Electronically Signed   By: Oneil Devonshire M.D.   On: 04/02/2024 20:25   CT ABDOMEN PELVIS W CONTRAST Result Date: 03/29/2024 EXAM: CT ABDOMEN AND PELVIS WITH CONTRAST 03/29/2024 05:48:36 PM TECHNIQUE: CT of the abdomen and pelvis was performed with the administration of 100 mL of iohexol  (OMNIPAQUE ) 300 MG/ML solution. Multiplanar reformatted images are provided for review.  Automated exposure control, iterative reconstruction, and/or weight-based adjustment of the mA/kV was utilized to reduce the radiation dose to as low as reasonably achievable. COMPARISON: 02/09/2024. CLINICAL HISTORY: RLQ abdominal pain; possible appendicitis. FINDINGS: LOWER CHEST: Bilateral lower lobe atelectasis. LIVER: The liver is unremarkable. GALLBLADDER AND BILE DUCTS: Gallbladder is unremarkable. No biliary ductal dilatation. SPLEEN: No acute abnormality. PANCREAS: No acute abnormality. ADRENAL GLANDS: No acute abnormality. KIDNEYS, URETERS AND BLADDER: No stones in the kidneys or ureters. No hydronephrosis. No perinephric or periureteral stranding. The bladder is mildly thick-walled but underdistended. GI AND BOWEL: Stomach demonstrates no acute abnormality. Abnormal appendix, measuring up to 16 mm with periappendiceal stranding (image 95), reflecting acute appendicitis. Suspected mild secondary inflammatory changes involving the adjacent terminal ileum. There is no bowel obstruction. PERITONEUM AND RETROPERITONEUM: No drainable fluid collection/abscess. No free air. VASCULATURE: Aorta is normal in caliber. LYMPH NODES: No  lymphadenopathy. REPRODUCTIVE ORGANS: The prostate is unremarkable. BONES AND SOFT TISSUES: Mild degenerative changes of the lumbar spine. No acute osseous abnormality. No focal soft tissue abnormality. IMPRESSION: 1. Acute appendicitis. No drainable fluid collection or free air. 2. Suspected mild secondary inflammatory changes involving the adjacent terminal ileum. Electronically signed by: Pinkie Pebbles MD 03/29/2024 06:34 PM EST RP Workstation: [id number]    Medications / Allergies: per chart  Antibiotics: Anti-infectives (From admission, onward)    Start     Dose/Rate Route Frequency Ordered Stop   04/04/24 2000  piperacillin -tazobactam (ZOSYN ) IVPB 3.375 g        3.375 g 12.5 mL/hr over 240 Minutes Intravenous Every 8 hours 04/04/24 1848 04/09/24 1959          Note: Portions of this report may have been transcribed using voice recognition software. Every effort was made to ensure accuracy; however, inadvertent computerized transcription errors may be present.   Any transcriptional errors that result from this process are unintentional.      04/04/2024  7:22 PM      [1]  Allergies Allergen Reactions   Vistaril  [Hydroxyzine ] Itching, Anxiety and Other (See Comments)    Hyperactivity    Alupent [Metaproterenol] Other (See Comments)    Hyperactivity   Seroquel  [Quetiapine ] Other (See Comments)    Hallucinations    Zyprexa  [Olanzapine ] Other (See Comments)    made patient feel weird   Antihistamines, Chlorpheniramine-Type Anxiety and Other (See Comments)    made patient feel insane - all antihistamines   Antihistamines, Diphenhydramine -Type Anxiety and Other (See Comments)    made patient feel insane - all antihistamines   Antihistamines, Loratadine-Type Anxiety and Other (See Comments)    made patient feel insane - all antihistamines   Clonidine And Derivatives Anxiety and Other (See Comments)    Caused a red/flushed face and had a panic attack   "

## 2024-04-04 NOTE — ED Notes (Signed)
 Scanner not working properly in room. Will scan sometimes and stop. Did two nurse verification with Dena.

## 2024-04-04 NOTE — ED Notes (Addendum)
 Pt began to feel itchy while Paramedic Chauncey was in the room. Paramedic Chauncey attempted to verbally deescalate the pt, and ease some anxiety. Pt became agitated and requested a different Nurse to take care of him and would like to speak to the doctor. EDP and Charge nurse was notified.

## 2024-04-05 ENCOUNTER — Inpatient Hospital Stay (HOSPITAL_COMMUNITY): Payer: Self-pay

## 2024-04-05 LAB — CBC
HCT: 39.2 % (ref 39.0–52.0)
Hemoglobin: 13.4 g/dL (ref 13.0–17.0)
MCH: 30.7 pg (ref 26.0–34.0)
MCHC: 34.2 g/dL (ref 30.0–36.0)
MCV: 89.7 fL (ref 80.0–100.0)
Platelets: 194 K/uL (ref 150–400)
RBC: 4.37 MIL/uL (ref 4.22–5.81)
RDW: 12.2 % (ref 11.5–15.5)
WBC: 4.1 K/uL (ref 4.0–10.5)
nRBC: 0 % (ref 0.0–0.2)

## 2024-04-05 MED ORDER — GABAPENTIN 100 MG PO CAPS
200.0000 mg | ORAL_CAPSULE | Freq: Three times a day (TID) | ORAL | Status: DC
  Administered 2024-04-05 – 2024-04-08 (×10): 200 mg via ORAL
  Filled 2024-04-05 (×10): qty 2

## 2024-04-05 MED ORDER — PROCHLORPERAZINE EDISYLATE 10 MG/2ML IJ SOLN
10.0000 mg | Freq: Four times a day (QID) | INTRAMUSCULAR | Status: DC | PRN
  Administered 2024-04-05 – 2024-04-08 (×4): 10 mg via INTRAVENOUS
  Filled 2024-04-05 (×4): qty 2

## 2024-04-05 MED ORDER — KETOROLAC TROMETHAMINE 15 MG/ML IJ SOLN
15.0000 mg | Freq: Four times a day (QID) | INTRAMUSCULAR | Status: DC | PRN
  Administered 2024-04-05: 30 mg via INTRAVENOUS
  Administered 2024-04-05: 15 mg via INTRAVENOUS
  Administered 2024-04-06 (×2): 30 mg via INTRAVENOUS
  Administered 2024-04-07: 15 mg via INTRAVENOUS
  Administered 2024-04-08 (×2): 30 mg via INTRAVENOUS
  Filled 2024-04-05 (×8): qty 2

## 2024-04-05 MED ORDER — ORAL CARE MOUTH RINSE: 15.0000 mL | OROMUCOSAL | Status: DC | PRN

## 2024-04-05 MED ORDER — CLONAZEPAM 0.5 MG PO TABS
0.5000 mg | ORAL_TABLET | Freq: Once | ORAL | Status: AC
  Administered 2024-04-05: 0.5 mg via ORAL
  Filled 2024-04-05: qty 1

## 2024-04-05 MED ORDER — ONDANSETRON HCL 4 MG/2ML IJ SOLN
4.0000 mg | Freq: Four times a day (QID) | INTRAMUSCULAR | Status: DC | PRN
  Administered 2024-04-05 – 2024-04-08 (×6): 4 mg via INTRAVENOUS
  Filled 2024-04-05 (×6): qty 2

## 2024-04-05 NOTE — Progress Notes (Signed)
 " PROGRESS NOTE    Ronald [name]  FMW:[id number] DOB: 10-May-1980 DOA: 04/04/2024 PCP: Joshua Debby CROME, MD     Brief Narrative:  Ronald [name] is a 43 y.o. male with medical history significant of PTSD, bipolar disorder, insomnia, chronic anxiety and benzodiazepine dependence presented to emergency department with complaining of continuation of right lower quadrant pain in the setting of chronic appendicitis.  Patient was admitted 4 days ago and discharged home after being discussed with general surgery being planned to to treat conservatively as repeat CT abdomen pelvis did not show any evidence of abdominal abscess/perforation.  Patient was initially treated with IV antibiotics and transition to Augmentin  to complete 7 to 10 days course and plan for outpatient schedule/surgery in 6-week.   New events last 24 hours / Subjective: Continues to complain of mild right lower quadrant abdominal pain.  [birth year].  Assessment & Plan:   Principal Problem:   Chronic appendicitis Active Problems:   GAD (generalized anxiety disorder)   Bipolar 1 disorder with moderate mania (HCC)   PTSD (post-traumatic stress disorder)   Multiple drug allergies   History of bipolar disorder   Chronic appendicitis - Appreciate general surgery following, continuing nonoperative management for now - N.p.o. at midnight in case he needs surgery - Continue IV Zosyn  - Continue pain management - Continue IV fluid  History of PTSD, bipolar disorder, generalized anxiety disorder - Depakote    DVT prophylaxis:  enoxaparin  (LOVENOX ) injection 40 mg Start: 04/04/24 2200 SCDs Start: 04/04/24 1923 Place TED hose Start: 04/04/24 1923  Code Status: Full code Family Communication: None at bedside Disposition Plan: Home Status is: Inpatient Remains inpatient appropriate because: IV antibiotics    Antimicrobials:  Anti-infectives (From admission, onward)    Start     Dose/Rate Route  Frequency Ordered Stop   04/04/24 2000  piperacillin -tazobactam (ZOSYN ) IVPB 3.375 g        3.375 g 12.5 mL/hr over 240 Minutes Intravenous Every 8 hours 04/04/24 1848 04/09/24 1959        Objective: Vitals:   04/05/24 0551 04/05/24 0638 04/05/24 0723 04/05/24 1342  BP: 107/78   103/64  Pulse: (!) 47   68  Resp: 12   16  Temp: 98.4 F (36.9 C)   98.5 F (36.9 C)  TempSrc: Oral     SpO2:    97%  Weight:   75.7 kg   Height:  5' 11 (1.803 m)      Intake/Output Summary (Last 24 hours) at 04/05/2024 1350 Last data filed at 04/05/2024 9367 Gross per 24 hour  Intake 723.75 ml  Output 300 ml  Net 423.75 ml   Filed Weights   04/05/24 0723  Weight: 75.7 kg    Examination:  General exam: Appears calm and comfortable  Respiratory system: Clear to auscultation. Respiratory effort normal. No respiratory distress. No conversational dyspnea.  Cardiovascular system: S1 & S2 heard, RRR. No murmurs. No pedal edema. Gastrointestinal system: Abdomen is nondistended, soft and tender to palpation right lower quadrant Central nervous system: Alert and oriented. No focal neurological deficits. Speech clear.  Extremities: Symmetric in appearance  Skin: No rashes, lesions or ulcers on exposed skin  Psychiatry: Judgement and insight appear normal. Mood & affect appropriate.   Data Reviewed: I have personally reviewed following labs and imaging studies  CBC: Recent Labs  Lab 03/30/24 0536 03/31/24 0633 04/01/24 0541 04/04/24 1657 04/05/24 0636  WBC 6.7 5.6 4.8 5.4 4.1  HGB 13.1 15.0 13.7 14.2  13.4  HCT 37.0* 44.7 40.7 40.6 39.2  MCV 88.9 91.0 90.8 88.5 89.7  PLT 236 267 265 243 194   Basic Metabolic Panel: Recent Labs  Lab 03/30/24 0536 04/01/24 0541 04/04/24 1657  NA 137 142 134*  K 4.4 5.1 4.5  CL 101 102 99  CO2 29 32 25  GLUCOSE 98 85 108*  BUN 10 8 10   CREATININE 0.97 1.12 0.82  CALCIUM  9.0 9.4 9.1  MG 2.1  --   --   PHOS 3.8  --   --    GFR: Estimated  Creatinine Clearance: 123.7 mL/min (by C-G formula based on SCr of 0.82 mg/dL). Liver Function Tests: Recent Labs  Lab 03/30/24 0536 04/04/24 1657  AST 15 29  ALT 8 19  ALKPHOS 39 44  BILITOT 0.3 0.3  PROT 6.0* 6.8  ALBUMIN 3.6 4.2   No results for input(s): LIPASE, AMYLASE in the last 168 hours. No results for input(s): AMMONIA in the last 168 hours. Coagulation Profile: No results for input(s): INR, PROTIME in the last 168 hours. Cardiac Enzymes: No results for input(s): CKTOTAL, CKMB, CKMBINDEX, TROPONINI in the last 168 hours. BNP (last 3 results) No results for input(s): PROBNP in the last 8760 hours. HbA1C: No results for input(s): HGBA1C in the last 72 hours. CBG: No results for input(s): GLUCAP in the last 168 hours. Lipid Profile: No results for input(s): CHOL, HDL, LDLCALC, TRIG, CHOLHDL, LDLDIRECT in the last 72 hours. Thyroid  Function Tests: No results for input(s): TSH, T4TOTAL, FREET4, T3FREE, THYROIDAB in the last 72 hours. Anemia Panel: No results for input(s): VITAMINB12, FOLATE, FERRITIN, TIBC, IRON, RETICCTPCT in the last 72 hours. Sepsis Labs: No results for input(s): PROCALCITON, LATICACIDVEN in the last 168 hours.  No results found for this or any previous visit (from the past 240 hours).    Radiology Studies: DG ABD ACUTE 2+V W 1V CHEST Result Date: 04/05/2024 CLINICAL DATA:  Evaluate for free air, history of right lower quadrant pain EXAM: DG ABDOMEN ACUTE WITH 1 VIEW CHEST COMPARISON:  CT from 04/02/2024 FINDINGS: Cardiac shadow is within normal limits. The lungs are well aerated bilaterally. No focal infiltrate or effusion is seen. Scattered large and small bowel gas is noted. No obstructive changes are seen. No mass effect is noted. No free air is noted. No bony abnormality is seen. IMPRESSION: No acute abnormality in the chest and abdomen. Specifically no findings to suggest free air are  noted. Electronically Signed   By: Oneil Devonshire M.D.   On: 04/05/2024 00:39      Scheduled Meds:  acetaminophen   1,000 mg Oral Q6H   bisacodyl   10 mg Rectal Daily   divalproex   1,000 mg Oral BID   enoxaparin  (LOVENOX ) injection  40 mg Subcutaneous Q24H   gabapentin   200 mg Oral TID   polycarbophil  625 mg Oral BID   sodium chloride  flush  3 mL Intravenous Q12H   traZODone   50 mg Oral QHS   Continuous Infusions:  sodium chloride      dextrose  5% lactated ringers  75 mL/hr at 04/05/24 0013   lactated ringers      piperacillin -tazobactam (ZOSYN )  IV 3.375 g (04/05/24 1300)     LOS: 1 day   Time spent: 25 minutes   Delon Hoe, DO [facility] 04/05/2024, 1:50 PM   Available via Epic secure chat 7am-7pm After these hours, please refer to coverage provider listed on [email or web address]  "

## 2024-04-05 NOTE — Plan of Care (Signed)

## 2024-04-05 NOTE — Progress Notes (Signed)
 MEWS Progress Note  Patient Details Name: Ronald [name] MRN: [id number] DOB: 08-26-1980 Today's Date: 04/05/2024   MEWS Flowsheet Documentation:  Assess: MEWS Score Temp: 98.4 F (36.9 C) BP: 107/78 MAP (mmHg): 87 Pulse Rate: (!) 47 Resp: 12 Level of Consciousness: Alert SpO2: 96 % O2 Device: Room Air Assess: MEWS Score MEWS Temp: 0 MEWS Systolic: 0 MEWS Pulse: 1 MEWS RR: 1 MEWS LOC: 0 MEWS Score: 2 MEWS Score Color: Yellow Assess: SIRS CRITERIA SIRS Temperature : 0 SIRS Respirations : 0 SIRS Pulse: 0 SIRS WBC: 0 SIRS Score Sum : 0 Assess: if the MEWS score is Yellow or Red Were vital signs accurate and taken at a resting state?: Yes Does the patient meet 2 or more of the SIRS criteria?: Yes Does the patient have a confirmed or suspected source of infection?: Yes MEWS guidelines implemented : Yes, yellow Treat MEWS Interventions: Considered administering scheduled or prn medications/treatments as ordered Take Vital Signs Increase Vital Sign Frequency : Yellow: Q2hr x1, continue Q4hrs until patient remains green for 12hrs Escalate MEWS: Escalate: Yellow: Discuss with charge nurse and consider notifying provider and/or RRT Notify: Charge Nurse/RN Name of Charge Nurse/RN Notified: Marley, RN Provider Notification Provider Name/Title: Dr. Lee Date Provider Notified: 04/05/24 Time Provider Notified: [id number] Method of Notification: Page (secure chat) Notification Reason: Other (Comment) (yellow MEWS) Provider response: See new orders Date of Provider Response: 04/05/24 Time of Provider Response: 0616      [name] Eck 04/05/2024, 6:16 AM

## 2024-04-05 NOTE — Plan of Care (Signed)

## 2024-04-05 NOTE — Plan of Care (Addendum)
 Patient's heart rate upper 40s range 47-51. Sinus bradycardia in the setting of of polypharmacy-pain medication and Ativan . Holding pain medication and Ativan . Avoiding further morphine  and Dilaudid . Continue Toradol  as needed. Continue cardiac monitoring.  Changing bed request to telemetry.   Annette Liotta, MD [facility] 04/05/2024, 6:15 AM

## 2024-04-05 NOTE — Progress Notes (Signed)
 Patient called the nurses station at 0730 complaining of pain and requested pain medication. This nurse took Toradol  30 mg into patients room to administer for pain. Patient stated that he was familiar with the other pain medication, not Toradol . This nurse attempted to educate patient and patient stated I am not familiar with Toradol , so I do not want it. Wasted 30 mg of Toradol .

## 2024-04-05 NOTE — Progress Notes (Signed)
"   ° °  Subjective/Chief Complaint: Pt reports almost no abdominal pain this morning and wants to try to eat Last BM was yesterday   Objective: Vital signs in last 24 hours: Temp:  [97.5 F (36.4 C)-98.5 F (36.9 C)] 98.4 F (36.9 C) (12/22 0551) Pulse Rate:  [47-70] 47 (12/22 0551) Resp:  [12-17] 12 (12/22 0551) BP: (102-124)/(56-95) 107/78 (12/22 0551) SpO2:  [96 %-100 %] 96 % (12/22 0326) Weight:  [75.7 kg] 75.7 kg (12/22 0723) Last BM Date : 04/04/24  Intake/Output from previous day: 12/21 0701 - 12/22 0700 In: 723.8 [P.O.:150; I.V.:473.8; IV Piggyback:100] Out: 300 [Urine:300] Intake/Output this shift: No intake/output data recorded.  Exam: Awake and alert Looks comfortable Abdomen soft, non-tender this morning with no guarding especially in the RLQ  Lab Results:  Recent Labs    04/04/24 1657 04/05/24 0636  WBC 5.4 4.1  HGB 14.2 13.4  HCT 40.6 39.2  PLT 243 194   BMET Recent Labs    04/04/24 1657  NA 134*  K 4.5  CL 99  CO2 25  GLUCOSE 108*  BUN 10  CREATININE 0.82  CALCIUM  9.1   PT/INR No results for input(s): LABPROT, INR in the last 72 hours. ABG No results for input(s): PHART, HCO3 in the last 72 hours.  Invalid input(s): PCO2, PO2  Studies/Results: DG ABD ACUTE 2+V W 1V CHEST Result Date: 04/05/2024 CLINICAL DATA:  Evaluate for free air, history of right lower quadrant pain EXAM: DG ABDOMEN ACUTE WITH 1 VIEW CHEST COMPARISON:  CT from 04/02/2024 FINDINGS: Cardiac shadow is within normal limits. The lungs are well aerated bilaterally. No focal infiltrate or effusion is seen. Scattered large and small bowel gas is noted. No obstructive changes are seen. No mass effect is noted. No free air is noted. No bony abnormality is seen. IMPRESSION: No acute abnormality in the chest and abdomen. Specifically no findings to suggest free air are noted. Electronically Signed   By: Oneil Devonshire M.D.   On: 04/05/2024 00:39     Anti-infectives: Anti-infectives (From admission, onward)    Start     Dose/Rate Route Frequency Ordered Stop   04/04/24 2000  piperacillin -tazobactam (ZOSYN ) IVPB 3.375 g        3.375 g 12.5 mL/hr over 240 Minutes Intravenous Every 8 hours 04/04/24 1848 04/09/24 1959       Assessment/Plan: Appendicitis  Clinically, his exam is better than Saturday morning and his WBC remains normal. We will continue with non-operative management for now.  Will try a soft diet.  Will make NPO at midnight in case he clinically changes and needs a repeat CT vs laparoscopy. He agrees with the plans   Vicenta Poli 04/05/2024  "

## 2024-04-06 ENCOUNTER — Inpatient Hospital Stay (HOSPITAL_COMMUNITY): Payer: Self-pay

## 2024-04-06 ENCOUNTER — Ambulatory Visit: Payer: Self-pay | Admitting: General Surgery

## 2024-04-06 LAB — CBC
HCT: 39.4 % (ref 39.0–52.0)
Hemoglobin: 13.6 g/dL (ref 13.0–17.0)
MCH: 30.9 pg (ref 26.0–34.0)
MCHC: 34.5 g/dL (ref 30.0–36.0)
MCV: 89.5 fL (ref 80.0–100.0)
Platelets: 219 K/uL (ref 150–400)
RBC: 4.4 MIL/uL (ref 4.22–5.81)
RDW: 12.2 % (ref 11.5–15.5)
WBC: 6.6 K/uL (ref 4.0–10.5)
nRBC: 0 % (ref 0.0–0.2)

## 2024-04-06 LAB — BASIC METABOLIC PANEL WITH GFR
Anion gap: 11 (ref 5–15)
BUN: 10 mg/dL (ref 6–20)
CO2: 25 mmol/L (ref 22–32)
Calcium: 9.2 mg/dL (ref 8.9–10.3)
Chloride: 106 mmol/L (ref 98–111)
Creatinine, Ser: 0.9 mg/dL (ref 0.61–1.24)
GFR, Estimated: >60 mL/min
Glucose, Bld: 89 mg/dL (ref 70–99)
Potassium: 4.1 mmol/L (ref 3.5–5.1)
Sodium: 142 mmol/L (ref 135–145)

## 2024-04-06 MED ORDER — OXYCODONE HCL 5 MG PO TABS
5.0000 mg | ORAL_TABLET | ORAL | Status: DC | PRN
  Administered 2024-04-07 – 2024-04-08 (×7): 5 mg via ORAL
  Filled 2024-04-06 (×7): qty 1

## 2024-04-06 MED ORDER — IOHEXOL 300 MG/ML  SOLN
100.0000 mL | Freq: Once | INTRAMUSCULAR | Status: AC | PRN
  Administered 2024-04-06: 100 mL via INTRAVENOUS

## 2024-04-06 MED ORDER — IOHEXOL 9 MG/ML PO SOLN
500.0000 mL | ORAL | Status: AC
  Administered 2024-04-06 (×2): 500 mL via ORAL

## 2024-04-06 MED ORDER — HYDROMORPHONE HCL 1 MG/ML IJ SOLN
0.5000 mg | INTRAMUSCULAR | Status: DC | PRN
  Administered 2024-04-06 – 2024-04-08 (×8): 0.5 mg via INTRAVENOUS
  Filled 2024-04-06 (×8): qty 0.5

## 2024-04-06 MED ORDER — HYDROMORPHONE HCL 1 MG/ML IJ SOLN
1.0000 mg | INTRAMUSCULAR | Status: DC | PRN
  Administered 2024-04-06 (×2): 1 mg via INTRAVENOUS
  Filled 2024-04-06 (×2): qty 1

## 2024-04-06 MED ORDER — FENTANYL CITRATE (PF) 50 MCG/ML IJ SOSY
12.5000 ug | PREFILLED_SYRINGE | Freq: Once | INTRAMUSCULAR | Status: AC
  Administered 2024-04-06: 12.5 ug via INTRAVENOUS
  Filled 2024-04-06: qty 1

## 2024-04-06 MED ORDER — CLONAZEPAM 0.5 MG PO TABS
0.5000 mg | ORAL_TABLET | Freq: Once | ORAL | Status: AC
  Administered 2024-04-06: 0.5 mg via ORAL
  Filled 2024-04-06: qty 1

## 2024-04-06 MED ORDER — METHOCARBAMOL 500 MG PO TABS
500.0000 mg | ORAL_TABLET | Freq: Three times a day (TID) | ORAL | Status: DC | PRN
  Administered 2024-04-06 – 2024-04-08 (×3): 500 mg via ORAL
  Filled 2024-04-06 (×3): qty 1

## 2024-04-06 NOTE — Plan of Care (Signed)

## 2024-04-06 NOTE — TOC Initial Note (Signed)
 Transition of Care [facility]) - Initial/Assessment Note    Patient Details  Name: Ronald [name] MRN: [id number] Date of Birth: [birth year]  [facility] CM/SW Contact:    Ronald DELENA Saltness, LCSW Phone Number: 04/06/2024, 9:08 AM  Clinical Narrative:                 Pt admitted to the hospital due to abdominal pain and acute appendicitis. Pt currently on IV antibiotics. TOC will continue to follow.   Expected Discharge Plan: Home/Self Care Barriers to Discharge: Continued Medical Work up   Patient Goals and CMS Choice Patient states their goals for this hospitalization and ongoing recovery are:: To return home   Choice offered to / list presented to : NA [place] ownership interest in [facility].provided to:: Parent NA    Expected Discharge Plan and Services In-house Referral: Clinical Social Work Discharge Planning Services: NA Post Acute Care Choice: NA Living arrangements for the past 2 months: Single Family Home                 DME Arranged: N/A DME Agency: NA       HH Arranged: NA HH Agency: NA        Prior Living Arrangements/Services Living arrangements for the past 2 months: Single Family Home Lives with:: Self Patient language and need for interpreter reviewed:: Yes Do you feel safe going back to the place where you live?: Yes      Need for Family Participation in Patient Care: No (Comment) Care giver support system in place?: Yes (comment)   Criminal Activity/Legal Involvement Pertinent to Current Situation/Hospitalization: No - Comment as needed  Activities of Daily Living   ADL Screening (condition at time of admission) Independently performs ADLs?: Yes (appropriate for developmental age) Is the patient deaf or have difficulty hearing?: No Does the patient have difficulty seeing, even when wearing glasses/contacts?: No Does the patient have difficulty concentrating, remembering, or making decisions?: No  Permission  Sought/Granted   Permission granted to share information with : Yes, Verbal Permission Granted     Permission granted to share info w AGENCY: HH agencies  Emotional Assessment Appearance:: Appears stated age Attitude/Demeanor/Rapport: Unable to Assess Affect (typically observed): Unable to Assess Orientation: : Oriented to Self, Oriented to Place, Oriented to  Time, Oriented to Situation Alcohol / Substance Use: Not Applicable Psych Involvement: No (comment)  Admission diagnosis:  Chronic appendicitis [K36] Acute appendicitis, unspecified acute appendicitis type [K35.80] Patient Active Problem List   Diagnosis Date Noted   Chronic appendicitis 04/04/2024   Multiple drug allergies 04/04/2024   History of bipolar disorder 04/04/2024   Acute appendicitis 03/30/2024   Non-cardiac chest pain 02/10/2024   Cannabis use disorder 05/19/2023   Schizoaffective disorder, bipolar type (HCC) 05/18/2023   Encounter for screening for HIV 11/08/2022   Need for hepatitis C screening test 11/08/2022   Therapeutic drug monitoring 11/08/2022   Bipolar 1 disorder, depressed, full remission 11/08/2022   PTSD (post-traumatic stress disorder) 09/27/2021   Bipolar affective disorder, currently active (HCC) 09/27/2021   Severe bipolar I disorder, recurrent manic episode, with psychotic behavior (HCC) 08/13/2021   Bipolar 1 disorder with moderate mania (HCC) 06/03/2021   GAD (generalized anxiety disorder) 12/15/2016   Disorder of pineal gland    PCP:  Ronald Debby CROME, MD Pharmacy:   [facility] [address], [name] - [address] [name] [name] [address] Phone: [phone] Fax: [phone]   Social  Drivers of Health (SDOH) Social History: SDOH Screenings   Food Insecurity: No Food Insecurity (04/05/2024)  Recent Concern: Food Insecurity - Food Insecurity Present (03/30/2024)  Housing: Low Risk (04/05/2024)  Transportation Needs: No Transportation Needs  (04/05/2024)  Utilities: Not At Risk (04/05/2024)  Alcohol Screen: Low Risk (12/27/2023)  Depression (PHQ2-9): Low Risk (03/04/2024)  Tobacco Use: High Risk (04/04/2024)   SDOH Interventions: None     Readmission Risk Interventions    04/06/2024    9:06 AM  Readmission Risk Prevention Plan  Transportation Screening Complete  PCP or Specialist Appt within 3-5 Days Complete  HRI or Home Care Consult Complete  Social Work Consult for Recovery Care Planning/Counseling Complete  Palliative Care Screening Not Applicable  Medication Review [employer]) Complete    Signed: Heather [name], MSW, LCSW Clinical Social Worker Inpatient Care Management 04/06/2024 9:11 AM

## 2024-04-06 NOTE — Progress Notes (Signed)
 I spoke to patient's mother, Jenkins, to relay findings for his CT scan as requested.  His appendix appears to show improvement on his CT scan.  No perforation or abscess noted with decrease in inflammation.  We will continue IV abx therapy and if he continues to improve, then plan would be for oral abx at home and interval lap appy in 2-3 weeks with Dr. Curvin.  I have relayed this to the primary service as well.  Mom did inform me that she thinks patient is supposed to be on daily Haldol  as well.  This is not in his MAR.  I relayed to primary service.  They are going to have psych see the patient while he is here.  Mother was appreciative.    Burnard FORBES Banter, PA-C 2:04 PM

## 2024-04-06 NOTE — Progress Notes (Signed)
 " PROGRESS NOTE    Ronald [name]  FMW:[id number] DOB: 10/24/1980 DOA: 04/04/2024 PCP: Joshua Debby CROME, MD   Brief Narrative:   43 y.o. male with medical history significant of PTSD, bipolar disorder, insomnia, chronic anxiety and benzodiazepine dependence presented to emergency department with complaining of continuation of right lower quadrant pain in the setting of chronic appendicitis.  Patient was admitted 4 days ago and discharged home after being discussed with general surgery being planned to to treat conservatively as repeat CT abdomen pelvis did not show any evidence of abdominal abscess/perforation.  Patient was initially treated with IV antibiotics and transitioned to Augmentin  to complete 7 to 10 days course and plan for outpatient schedule/surgery in 6-week.  General surgery was reconsulted.  Patient was started on IV Zosyn .  Assessment & Plan:   Chronic appendicitis - General Surgery following.  Continue IV Zosyn  and pain management. -N.p.o. this morning in case patient needs surgery  History of PTSD/bipolar disorder/generalized anxiety disorder with intermittent agitation - Continue Depakote  - Extremely angry and very upset this morning.  Will consult psychiatry.  Hyponatremia -Mild.  Labs pending today.   DVT prophylaxis: Patient has refused Lovenox  during this hospitalization Code Status: Full Family Communication: None at bedside Disposition Plan: Status is: Inpatient Remains inpatient appropriate because: Of severity of illness    Consultants: General surgery.  Consult psychiatry  Procedures: None  Antimicrobials: Zosyn    Subjective: Patient seen and examined at bedside.  Continues to have severe intermittent abdominal pain.  Wants to get his appendix out now.  No fever, vomiting, chest pain reported.  Objective: Vitals:   04/05/24 1719 04/05/24 1927 04/06/24 0118 04/06/24 0500  BP: 114/70 122/78 118/74 117/78  Pulse: 64 67 (!) 51   Resp: (!)  22 15 20    Temp: 98 F (36.7 C) 98.2 F (36.8 C) 98.3 F (36.8 C) 98.5 F (36.9 C)  TempSrc:  Oral  Oral  SpO2: 98%   98%  Weight:      Height:        Intake/Output Summary (Last 24 hours) at 04/06/2024 0655 Last data filed at 04/06/2024 0501 Gross per 24 hour  Intake --  Output 2575 ml  Net -2575 ml   Filed Weights   04/05/24 0723  Weight: 75.7 kg    Examination:  General exam: Is intermittently extremely angry/upset and almost yelling.  Respiratory system: Bilateral decreased breath sounds at bases Cardiovascular system: S1 & S2 heard, Rate controlled Gastrointestinal system: Abdomen is nondistended, soft and mildly tender in the right lower quadrant.  Normal bowel sounds heard. Extremities: No cyanosis, clubbing, edema   Data Reviewed: I have personally reviewed following labs and imaging studies  CBC: Recent Labs  Lab 03/31/24 0633 04/01/24 0541 04/04/24 1657 04/05/24 0636  WBC 5.6 4.8 5.4 4.1  HGB 15.0 13.7 14.2 13.4  HCT 44.7 40.7 40.6 39.2  MCV 91.0 90.8 88.5 89.7  PLT 267 265 243 194   Basic Metabolic Panel: Recent Labs  Lab 04/01/24 0541 04/04/24 1657  NA 142 134*  K 5.1 4.5  CL 102 99  CO2 32 25  GLUCOSE 85 108*  BUN 8 10  CREATININE 1.12 0.82  CALCIUM  9.4 9.1   GFR: Estimated Creatinine Clearance: 123.7 mL/min (by C-G formula based on SCr of 0.82 mg/dL). Liver Function Tests: Recent Labs  Lab 04/04/24 1657  AST 29  ALT 19  ALKPHOS 44  BILITOT 0.3  PROT 6.8  ALBUMIN 4.2   No results for input(s):  LIPASE, AMYLASE in the last 168 hours. No results for input(s): AMMONIA in the last 168 hours. Coagulation Profile: No results for input(s): INR, PROTIME in the last 168 hours. Cardiac Enzymes: No results for input(s): CKTOTAL, CKMB, CKMBINDEX, TROPONINI in the last 168 hours. BNP (last 3 results) No results for input(s): PROBNP in the last 8760 hours. HbA1C: No results for input(s): HGBA1C in the last 72  hours. CBG: No results for input(s): GLUCAP in the last 168 hours. Lipid Profile: No results for input(s): CHOL, HDL, LDLCALC, TRIG, CHOLHDL, LDLDIRECT in the last 72 hours. Thyroid  Function Tests: No results for input(s): TSH, T4TOTAL, FREET4, T3FREE, THYROIDAB in the last 72 hours. Anemia Panel: No results for input(s): VITAMINB12, FOLATE, FERRITIN, TIBC, IRON, RETICCTPCT in the last 72 hours. Sepsis Labs: No results for input(s): PROCALCITON, LATICACIDVEN in the last 168 hours.  No results found for this or any previous visit (from the past 240 hours).       Radiology Studies: DG ABD ACUTE 2+V W 1V CHEST Result Date: 04/05/2024 CLINICAL DATA:  Evaluate for free air, history of right lower quadrant pain EXAM: DG ABDOMEN ACUTE WITH 1 VIEW CHEST COMPARISON:  CT from 04/02/2024 FINDINGS: Cardiac shadow is within normal limits. The lungs are well aerated bilaterally. No focal infiltrate or effusion is seen. Scattered large and small bowel gas is noted. No obstructive changes are seen. No mass effect is noted. No free air is noted. No bony abnormality is seen. IMPRESSION: No acute abnormality in the chest and abdomen. Specifically no findings to suggest free air are noted. Electronically Signed   By: Oneil Devonshire M.D.   On: 04/05/2024 00:39        Scheduled Meds:  acetaminophen   1,000 mg Oral Q6H   bisacodyl   10 mg Rectal Daily   divalproex   1,000 mg Oral BID   enoxaparin  (LOVENOX ) injection  40 mg Subcutaneous Q24H   gabapentin   200 mg Oral TID   polycarbophil  625 mg Oral BID   sodium chloride  flush  3 mL Intravenous Q12H   traZODone   50 mg Oral QHS   Continuous Infusions:  lactated ringers      piperacillin -tazobactam (ZOSYN )  IV 3.375 g (04/06/24 0428)          Sophie Mao, MD [facility] 04/06/2024, 6:55 AM   "

## 2024-04-06 NOTE — Plan of Care (Signed)

## 2024-04-06 NOTE — Progress Notes (Signed)
 Asked to see patient again as he states he didn't see the surgeon this morning.  We discussed that he did see Dr. Curvin this morning at which time he was comfortable and pain was well controlled.  He is now complaining of significant pain that he states was because he over ate [employer] last night.  We discussed that Dr. Curvin ordered a CT scan this morning to follow up on this process, for which radiology was present to take him downstairs while I was there.  We discussed that sometimes the things he request are not always what is best/safe for him and that is why sometimes we don't advance his diet when he requests, etc.  He mentioned the desire for surgery.  We discussed that we are trying to do the safest thing for him with conservative management and abx therapy given the way his CT scan looks with the hope of an interval appy in 6-8 week; however, if he fails this management, he may require acute surgical intervention while he is here.  However, we discussed that an acute surgery can carry a higher risk of a more invasive surgery or more complications due to the acute inflammation present.  Therefore, we like to hold on this if we are able.  He states that he understands this.  This has been discussed with him before now as well, but I'm concerned his mental health is contributing to his more impulsive behaviors and reactions not allowing for full reasonable understanding of this process.  Either way, he expresses understanding at the end of our conversation and is currently calm.  We will await his CT scan and make further recommendations at that time.  D/w RN on the ward.  Ronald FORBES Banter, PA-C 12:11 PM 04/06/2024

## 2024-04-06 NOTE — Progress Notes (Signed)
"   ° °  Subjective/Chief Complaint: Complains of abd pain   Objective: Vital signs in last 24 hours: Temp:  [98 F (36.7 C)-98.5 F (36.9 C)] 98.5 F (36.9 C) (12/23 0500) Pulse Rate:  [51-68] 51 (12/23 0118) Resp:  [15-22] 20 (12/23 0118) BP: (103-122)/(64-78) 117/78 (12/23 0500) SpO2:  [97 %-98 %] 98 % (12/23 0500) Last BM Date : 04/05/24  Intake/Output from previous day: 12/22 0701 - 12/23 0700 In: -  Out: 2575 [Urine:2575] Intake/Output this shift: No intake/output data recorded.  General appearance: alert and cooperative Resp: clear to auscultation bilaterally Cardio: regular rate and rhythm GI: soft, mild to mod RLQ pain. No guarding  Lab Results:  Recent Labs    04/05/24 0636 04/06/24 0540  WBC 4.1 6.6  HGB 13.4 13.6  HCT 39.2 39.4  PLT 194 219   BMET Recent Labs    04/04/24 1657 04/06/24 0540  NA 134* 142  K 4.5 4.1  CL 99 106  CO2 25 25  GLUCOSE 108* 89  BUN 10 10  CREATININE 0.82 0.90  CALCIUM  9.1 9.2   PT/INR No results for input(s): LABPROT, INR in the last 72 hours. ABG No results for input(s): PHART, HCO3 in the last 72 hours.  Invalid input(s): PCO2, PO2  Studies/Results: DG ABD ACUTE 2+V W 1V CHEST Result Date: 04/05/2024 CLINICAL DATA:  Evaluate for free air, history of right lower quadrant pain EXAM: DG ABDOMEN ACUTE WITH 1 VIEW CHEST COMPARISON:  CT from 04/02/2024 FINDINGS: Cardiac shadow is within normal limits. The lungs are well aerated bilaterally. No focal infiltrate or effusion is seen. Scattered large and small bowel gas is noted. No obstructive changes are seen. No mass effect is noted. No free air is noted. No bony abnormality is seen. IMPRESSION: No acute abnormality in the chest and abdomen. Specifically no findings to suggest free air are noted. Electronically Signed   By: Oneil Devonshire M.D.   On: 04/05/2024 00:39    Anti-infectives: Anti-infectives (From admission, onward)    Start     Dose/Rate Route  Frequency Ordered Stop   04/04/24 2000  piperacillin -tazobactam (ZOSYN ) IVPB 3.375 g        3.375 g 12.5 mL/hr over 240 Minutes Intravenous Every 8 hours 04/04/24 1848 04/09/24 1959       Assessment/Plan: s/p * No surgery found * Advance diet. Allow clears Wbc normal. Continue IV abx If no improvement then will consider repeating CT in next couple days Ambulate Schizophrenia per medicine  LOS: 2 days    Ronald [name] 04/06/2024  "

## 2024-04-07 DIAGNOSIS — F439 Reaction to severe stress, unspecified: Secondary | ICD-10-CM

## 2024-04-07 DIAGNOSIS — F4542 Pain disorder with related psychological factors: Secondary | ICD-10-CM

## 2024-04-07 DIAGNOSIS — F319 Bipolar disorder, unspecified: Secondary | ICD-10-CM | POA: Diagnosis not present

## 2024-04-07 LAB — COMPREHENSIVE METABOLIC PANEL WITH GFR
ALT: 14 U/L (ref 0–44)
AST: 15 U/L (ref 15–41)
Albumin: 3.7 g/dL (ref 3.5–5.0)
Alkaline Phosphatase: 39 U/L (ref 38–126)
Anion gap: 6 (ref 5–15)
BUN: 6 mg/dL (ref 6–20)
CO2: 30 mmol/L (ref 22–32)
Calcium: 9 mg/dL (ref 8.9–10.3)
Chloride: 103 mmol/L (ref 98–111)
Creatinine, Ser: 1.02 mg/dL (ref 0.61–1.24)
GFR, Estimated: >60 mL/min
Glucose, Bld: 80 mg/dL (ref 70–99)
Potassium: 4.1 mmol/L (ref 3.5–5.1)
Sodium: 140 mmol/L (ref 135–145)
Total Bilirubin: 0.4 mg/dL (ref 0.0–1.2)
Total Protein: 5.8 g/dL — ABNORMAL LOW (ref 6.5–8.1)

## 2024-04-07 LAB — CBC WITH DIFFERENTIAL/PLATELET
Abs Immature Granulocytes: 0.02 K/uL (ref 0.00–0.07)
Basophils Absolute: 0 K/uL (ref 0.0–0.1)
Basophils Relative: 0 %
Eosinophils Absolute: 0 K/uL (ref 0.0–0.5)
Eosinophils Relative: 0 %
HCT: 39 % (ref 39.0–52.0)
Hemoglobin: 13.4 g/dL (ref 13.0–17.0)
Immature Granulocytes: 0 %
Lymphocytes Relative: 45 %
Lymphs Abs: 2.2 K/uL (ref 0.7–4.0)
MCH: 30.6 pg (ref 26.0–34.0)
MCHC: 34.4 g/dL (ref 30.0–36.0)
MCV: 89 fL (ref 80.0–100.0)
Monocytes Absolute: 0.4 K/uL (ref 0.1–1.0)
Monocytes Relative: 9 %
Neutro Abs: 2.1 K/uL (ref 1.7–7.7)
Neutrophils Relative %: 46 %
Platelets: 218 K/uL (ref 150–400)
RBC: 4.38 MIL/uL (ref 4.22–5.81)
RDW: 12.3 % (ref 11.5–15.5)
WBC: 4.8 K/uL (ref 4.0–10.5)
nRBC: 0 % (ref 0.0–0.2)

## 2024-04-07 LAB — MAGNESIUM: Magnesium: 2 mg/dL (ref 1.7–2.4)

## 2024-04-07 LAB — VALPROIC ACID LEVEL: Valproic Acid Lvl: 40 ug/mL — ABNORMAL LOW (ref 50–100)

## 2024-04-07 LAB — C-REACTIVE PROTEIN: CRP: <0.5 mg/dL
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
## 2024-04-07 NOTE — Plan of Care (Signed)
  Problem: Clinical Measurements: Goal: Ability to maintain clinical measurements within normal limits will improve Outcome: Progressing   Problem: Clinical Measurements: Goal: Will remain free from infection Outcome: Progressing   Problem: Clinical Measurements: Goal: Diagnostic test results will improve Outcome: Progressing   Problem: Clinical Measurements: Goal: Respiratory complications will improve Outcome: Progressing   Problem: Elimination: Goal: Will not experience complications related to bowel motility Outcome: Progressing

## 2024-04-07 NOTE — Progress Notes (Signed)
 " PROGRESS NOTE    Ronald [name]  FMW:[id number] DOB: 22-Aug-1980 DOA: 04/04/2024 PCP: Joshua Debby CROME, MD   Brief Narrative:   43 y.o. male with medical history significant of PTSD, bipolar disorder, insomnia, chronic anxiety and benzodiazepine dependence presented to emergency department with complaining of continuation of right lower quadrant pain in the setting of chronic appendicitis.  Patient was admitted 4 days ago and discharged home after being discussed with general surgery being planned to to treat conservatively as repeat CT abdomen pelvis did not show any evidence of abdominal abscess/perforation.  Patient was initially treated with IV antibiotics and transitioned to Augmentin  to complete 7 to 10 days course and plan for outpatient schedule/surgery in 6-week.  General surgery was reconsulted.  Patient was started on IV Zosyn .  Assessment & Plan:   Chronic appendicitis - General Surgery following: CT repeated on 04/06/2024 shows improving appendicitis as per general surgery.  General surgery plans to continue IV antibiotics for 1 more day and possible switching to oral antibiotics tomorrow with plans for interval appendectomy in the next 2 to 3 weeks.  Diet advancement as per general surgery.  Continue IV Zosyn .  Continue current IV pain management since patient is still in significant pain.  History of PTSD/bipolar disorder/generalized anxiety disorder with intermittent agitation - Continue Depakote  - Patient was extremely angry/belligerent and upset yesterday.  Unclear if the patient is supposed to be on Haldol  scheduled at home.  Psychiatry evaluation is pending.  Hyponatremia - Resolved  DVT prophylaxis: Patient has refused Lovenox  during this hospitalization Code Status: Full Family Communication: None at bedside Disposition Plan: Status is: Inpatient Remains inpatient appropriate because: Of severity of illness    Consultants: General surgery.  Consult  psychiatry  Procedures: None  Antimicrobials: Zosyn    Subjective: Patient seen and examined at bedside.  Continues to have intermittent severe abdominal pain.  No fever, vomiting, seizures reported. Objective: Vitals:   04/06/24 0500 04/06/24 1632 04/06/24 1936 04/07/24 0441  BP: 117/78 112/78 112/70 112/66  Pulse:  (!) 53 64 (!) 59  Resp:  15 15 18   Temp: 98.5 F (36.9 C) 98.4 F (36.9 C) 98.2 F (36.8 C) 97.8 F (36.6 C)  TempSrc: Oral Oral Oral Oral  SpO2: 98%     Weight:      Height:        Intake/Output Summary (Last 24 hours) at 04/07/2024 0845 Last data filed at 04/06/2024 1805 Gross per 24 hour  Intake 240 ml  Output 500 ml  Net -260 ml   Filed Weights   04/05/24 0723  Weight: 75.7 kg    Examination:  General: On room air.  No distress.  More calm this morning. respiratory: Decreased breath sounds at bases bilaterally with some crackles CVS: Currently rate controlled; S1-S2 heard  abdominal: Soft, right lower quadrant; slightly distended, no organomegaly; bowel sounds are heard  extremities: No edema; no clubbing.     Data Reviewed: I have personally reviewed following labs and imaging studies  CBC: Recent Labs  Lab 04/01/24 0541 04/04/24 1657 04/05/24 0636 04/06/24 0540  WBC 4.8 5.4 4.1 6.6  HGB 13.7 14.2 13.4 13.6  HCT 40.7 40.6 39.2 39.4  MCV 90.8 88.5 89.7 89.5  PLT 265 243 194 219   Basic Metabolic Panel: Recent Labs  Lab 04/01/24 0541 04/04/24 1657 04/06/24 0540  NA 142 134* 142  K 5.1 4.5 4.1  CL 102 99 106  CO2 32 25 25  GLUCOSE 85 108* 89  BUN  8 10 10   CREATININE 1.12 0.82 0.90  CALCIUM  9.4 9.1 9.2   GFR: Estimated Creatinine Clearance: 112.7 mL/min (by C-G formula based on SCr of 0.9 mg/dL). Liver Function Tests: Recent Labs  Lab 04/04/24 1657  AST 29  ALT 19  ALKPHOS 44  BILITOT 0.3  PROT 6.8  ALBUMIN 4.2   No results for input(s): LIPASE, AMYLASE in the last 168 hours. No results for input(s): AMMONIA  in the last 168 hours. Coagulation Profile: No results for input(s): INR, PROTIME in the last 168 hours. Cardiac Enzymes: No results for input(s): CKTOTAL, CKMB, CKMBINDEX, TROPONINI in the last 168 hours. BNP (last 3 results) No results for input(s): PROBNP in the last 8760 hours. HbA1C: No results for input(s): HGBA1C in the last 72 hours. CBG: No results for input(s): GLUCAP in the last 168 hours. Lipid Profile: No results for input(s): CHOL, HDL, LDLCALC, TRIG, CHOLHDL, LDLDIRECT in the last 72 hours. Thyroid  Function Tests: No results for input(s): TSH, T4TOTAL, FREET4, T3FREE, THYROIDAB in the last 72 hours. Anemia Panel: No results for input(s): VITAMINB12, FOLATE, FERRITIN, TIBC, IRON, RETICCTPCT in the last 72 hours. Sepsis Labs: No results for input(s): PROCALCITON, LATICACIDVEN in the last 168 hours.  No results found for this or any previous visit (from the past 240 hours).       Radiology Studies: CT ABDOMEN PELVIS W CONTRAST Result Date: 04/06/2024 EXAM: CT ABDOMEN AND PELVIS WITH CONTRAST 04/06/2024 12:30:00 PM TECHNIQUE: CT of the abdomen and pelvis was performed with the administration of 100 mL of iohexol  (OMNIPAQUE ) 300 MG/ML solution. Multiplanar reformatted images are provided for review. Automated exposure control, iterative reconstruction, and/or weight-based adjustment of the mA/kV was utilized to reduce the radiation dose to as low as reasonably achievable. COMPARISON: CTs with contrast dated 04/02/2024 and 03/29/2024, and earlier CTA chest, abdomen, and pelvis 02/09/2024. CLINICAL HISTORY: RLQ abdominal pain onset 2 weeks ago, with findings consistent with appendicitis on two earlier studies from this month. FINDINGS: LOWER CHEST: No acute abnormality. LIVER: The liver is unremarkable. GALLBLADDER AND BILE DUCTS: Gallbladder is unremarkable. No biliary ductal dilatation. SPLEEN: No acute abnormality.  PANCREAS: No acute abnormality. ADRENAL GLANDS: No adrenal mass. 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 There is no renal mass. No stones in the kidneys or ureters. No hydronephrosis. No perinephric or periureteral stranding. Urinary bladder is unremarkable. GI AND BOWEL: Stomach demonstrates no acute abnormality. There is no small bowel obstruction or inflammation. The appendix is swollen and inflamed again measuring 1.3 cm although with a less pronounced inflammatory reaction than on both prior studies. Findings do remain consistent with acute appendicitis. There is moderate fecal stasis with unremarkable large bowel wall. There is no evidence of appendiceal rupture or abscess. Some thickening noted in the adjacent terminal ileal segment, but this is probably reactive. PERITONEUM AND RETROPERITONEUM: No free fluid, free hemorrhage, free air, or incarcerated hernia. VASCULATURE: The abdominal aorta is normal. 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 LYMPH NODES: No lymphadenopathy. REPRODUCTIVE ORGANS: No acute abnormality. BONES AND SOFT TISSUES: Mild levocoliosis and degenerative change of the lumbar spine with transitional anatomy lumbosacral junction. No acute or other significant osseous findings. No focal soft tissue abnormality. IMPRESSION: 1. Acute appendicitis, with less pronounced inflammatory reaction than on prior studies. No evidence of appendiceal rupture or abscess. 2. 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 Electronically signed by: Francis Quam MD 04/06/2024 11:03  PM EST RP Workstation: [id number]        Scheduled Meds:  acetaminophen   1,000 mg Oral Q6H   bisacodyl   10 mg Rectal Daily   divalproex   1,000 mg Oral BID   enoxaparin  (LOVENOX ) injection  40 mg Subcutaneous Q24H   gabapentin   200 mg Oral TID   polycarbophil  625 mg Oral BID   sodium chloride  flush  3 mL Intravenous Q12H    traZODone   50 mg Oral QHS   Continuous Infusions:  piperacillin -tazobactam (ZOSYN )  IV 3.375 g (04/07/24 0406)          Sophie Mao, MD [facility] 04/07/2024, 8:45 AM   "

## 2024-04-07 NOTE — Plan of Care (Signed)

## 2024-04-07 NOTE — Progress Notes (Signed)
 "    Subjective/Chief Complaint: Still complains of RLQ pain. unchanged   Objective: Vital signs in last 24 hours: Temp:  [97.8 F (36.6 C)-98.4 F (36.9 C)] 97.8 F (36.6 C) (12/24 0441) Pulse Rate:  [53-64] 59 (12/24 0441) Resp:  [15-18] 18 (12/24 0441) BP: (112)/(66-78) 112/66 (12/24 0441) Last BM Date : 04/05/24  Intake/Output from previous day: 12/23 0701 - 12/24 0700 In: 240 [P.O.:240] Out: 500 [Urine:500] Intake/Output this shift: No intake/output data recorded.  General appearance: alert and cooperative Resp: clear to auscultation bilaterally Cardio: regular rate and rhythm GI: soft, mild to mod focal tenderness RLQ  Lab Results:  Recent Labs    04/05/24 0636 04/06/24 0540  WBC 4.1 6.6  HGB 13.4 13.6  HCT 39.2 39.4  PLT 194 219   BMET Recent Labs    04/04/24 1657 04/06/24 0540  NA 134* 142  K 4.5 4.1  CL 99 106  CO2 25 25  GLUCOSE 108* 89  BUN 10 10  CREATININE 0.82 0.90  CALCIUM  9.1 9.2   PT/INR No results for input(s): LABPROT, INR in the last 72 hours. ABG No results for input(s): PHART, HCO3 in the last 72 hours.  Invalid input(s): PCO2, PO2  Studies/Results: CT ABDOMEN PELVIS W CONTRAST Result Date: 04/06/2024 EXAM: CT ABDOMEN AND PELVIS WITH CONTRAST 04/06/2024 12:30:00 PM TECHNIQUE: CT of the abdomen and pelvis was performed with the administration of 100 mL of iohexol  (OMNIPAQUE ) 300 MG/ML solution. Multiplanar reformatted images are provided for review. Automated exposure control, iterative reconstruction, and/or weight-based adjustment of the mA/kV was utilized to reduce the radiation dose to as low as reasonably achievable. COMPARISON: CTs with contrast dated 04/02/2024 and 03/29/2024, and earlier CTA chest, abdomen, and pelvis 02/09/2024. CLINICAL HISTORY: RLQ abdominal pain onset 2 weeks ago, with findings consistent with appendicitis on two earlier studies from this month. FINDINGS: LOWER CHEST: No acute abnormality.  LIVER: The liver is unremarkable. GALLBLADDER AND BILE DUCTS: Gallbladder is unremarkable. No biliary ductal dilatation. SPLEEN: No acute abnormality. PANCREAS: No acute abnormality. ADRENAL GLANDS: No adrenal mass. 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 There is no renal mass. No stones in the kidneys or ureters. No hydronephrosis. No perinephric or periureteral stranding. Urinary bladder is unremarkable. GI AND BOWEL: Stomach demonstrates no acute abnormality. There is no small bowel obstruction or inflammation. The appendix is swollen and inflamed again measuring 1.3 cm although with a less pronounced inflammatory reaction than on both prior studies. Findings do remain consistent with acute appendicitis. There is moderate fecal stasis with unremarkable large bowel wall. There is no evidence of appendiceal rupture or abscess. Some thickening noted in the adjacent terminal ileal segment, but this is probably reactive. PERITONEUM AND RETROPERITONEUM: No free fluid, free hemorrhage, free air, or incarcerated hernia. VASCULATURE: The abdominal aorta is normal. 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 LYMPH NODES: No lymphadenopathy. REPRODUCTIVE ORGANS: No acute abnormality. BONES AND SOFT TISSUES: Mild levocoliosis and degenerative change of the lumbar spine with transitional anatomy lumbosacral junction. No acute or other significant osseous findings. No focal soft tissue abnormality. IMPRESSION: 1. Acute appendicitis, with less pronounced inflammatory reaction than on prior studies. No evidence of appendiceal rupture or abscess. 2. 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 Electronically signed by: Francis Quam MD 04/06/2024 11:03 PM EST RP Workstation: [id number]    Anti-infectives: Anti-infectives (From admission, onward)    Start  Dose/Rate Route Frequency Ordered Stop   04/04/24 2000   piperacillin -tazobactam (ZOSYN ) IVPB 3.375 g        3.375 g 12.5 mL/hr over 240 Minutes Intravenous Every 8 hours 04/04/24 1848 04/09/24 1959       Assessment/Plan: s/p * No surgery found * Advance diet Continue IV abx another day. If he continues to do well then may consider switching to oral abx tomorrow and workint toward d/c I have already placed orders to schedule interval appendectomy in the next 2-3 weeks  Appendicitis. Improving on CT yesterday  LOS: 3 days    Ronald [name] 04/07/2024  "

## 2024-04-08 LAB — CBC WITH DIFFERENTIAL/PLATELET
Abs Immature Granulocytes: 0.01 K/uL (ref 0.00–0.07)
Basophils Absolute: 0 K/uL (ref 0.0–0.1)
Basophils Relative: 1 %
Eosinophils Absolute: 0 K/uL (ref 0.0–0.5)
Eosinophils Relative: 0 %
HCT: 41.9 % (ref 39.0–52.0)
Hemoglobin: 14.2 g/dL (ref 13.0–17.0)
Immature Granulocytes: 0 %
Lymphocytes Relative: 51 %
Lymphs Abs: 3 K/uL (ref 0.7–4.0)
MCH: 30.5 pg (ref 26.0–34.0)
MCHC: 33.9 g/dL (ref 30.0–36.0)
MCV: 89.9 fL (ref 80.0–100.0)
Monocytes Absolute: 0.4 K/uL (ref 0.1–1.0)
Monocytes Relative: 7 %
Neutro Abs: 2.4 K/uL (ref 1.7–7.7)
Neutrophils Relative %: 41 %
Platelets: 212 K/uL (ref 150–400)
RBC: 4.66 MIL/uL (ref 4.22–5.81)
RDW: 12.4 % (ref 11.5–15.5)
WBC: 5.8 K/uL (ref 4.0–10.5)
nRBC: 0 % (ref 0.0–0.2)

## 2024-04-08 LAB — BASIC METABOLIC PANEL WITH GFR
Anion gap: 13 (ref 5–15)
BUN: 8 mg/dL (ref 6–20)
CO2: 27 mmol/L (ref 22–32)
Calcium: 9.1 mg/dL (ref 8.9–10.3)
Chloride: 101 mmol/L (ref 98–111)
Creatinine, Ser: 1.01 mg/dL (ref 0.61–1.24)
GFR, Estimated: >60 mL/min
Glucose, Bld: 88 mg/dL (ref 70–99)
Potassium: 3.7 mmol/L (ref 3.5–5.1)
Sodium: 140 mmol/L (ref 135–145)

## 2024-04-08 LAB — MAGNESIUM: Magnesium: 2.2 mg/dL (ref 1.7–2.4)

## 2024-04-08 LAB — C-REACTIVE PROTEIN: CRP: <0.5 mg/dL

## 2024-04-08 MED ORDER — METHOCARBAMOL 500 MG PO TABS: 500.0000 mg | ORAL_TABLET | Freq: Three times a day (TID) | ORAL | 0 refills | Status: AC | PRN

## 2024-04-08 MED ORDER — OXYCODONE HCL 5 MG PO TABS: 5.0000 mg | ORAL_TABLET | ORAL | 0 refills | Status: DC | PRN

## 2024-04-08 MED ORDER — METHOCARBAMOL 500 MG PO TABS: 500.0000 mg | ORAL_TABLET | Freq: Three times a day (TID) | ORAL | 0 refills | Status: DC | PRN

## 2024-04-08 MED ORDER — AMOXICILLIN-POT CLAVULANATE 875-125 MG PO TABS: 1.0000 | ORAL_TABLET | Freq: Two times a day (BID) | ORAL | 0 refills | Status: DC

## 2024-04-08 MED ORDER — CALCIUM POLYCARBOPHIL 625 MG PO TABS: 625.0000 mg | ORAL_TABLET | Freq: Two times a day (BID) | ORAL | 0 refills | Status: AC

## 2024-04-08 MED ORDER — ACETAMINOPHEN 500 MG PO TABS: 1000.0000 mg | ORAL_TABLET | Freq: Four times a day (QID) | ORAL | Status: AC

## 2024-04-08 MED ORDER — AMOXICILLIN-POT CLAVULANATE 875-125 MG PO TABS
1.0000 | ORAL_TABLET | Freq: Two times a day (BID) | ORAL | Status: DC
  Administered 2024-04-08: 1 via ORAL
  Filled 2024-04-08: qty 1

## 2024-04-08 MED ORDER — CALCIUM POLYCARBOPHIL 625 MG PO TABS: 625.0000 mg | ORAL_TABLET | Freq: Two times a day (BID) | ORAL | 0 refills | Status: DC

## 2024-04-08 MED ORDER — POLYETHYLENE GLYCOL 3350 17 G PO PACK: 17.0000 g | PACK | Freq: Every day | ORAL | Status: AC | PRN

## 2024-04-08 NOTE — Plan of Care (Signed)
" °  Problem: Education: Goal: Knowledge of General Education information will improve Description: Including pain rating scale, medication(s)/side effects and non-pharmacologic comfort measures 04/08/2024 1138 by Alaina Dozier PARAS, RN Outcome: Adequate for Discharge 04/08/2024 0758 by Alaina Dozier PARAS, RN Outcome: Progressing   Problem: Health Behavior/Discharge Planning: Goal: Ability to manage health-related needs will improve 04/08/2024 1138 by Alaina Dozier PARAS, RN Outcome: Adequate for Discharge 04/08/2024 0758 by Alaina Dozier PARAS, RN Outcome: Progressing   Problem: Clinical Measurements: Goal: Ability to maintain clinical measurements within normal limits will improve 04/08/2024 1138 by Alaina Dozier PARAS, RN Outcome: Adequate for Discharge 04/08/2024 0758 by Alaina Dozier PARAS, RN Outcome: Progressing Goal: Will remain free from infection 04/08/2024 1138 by Alaina Dozier PARAS, RN Outcome: Adequate for Discharge 04/08/2024 0758 by Alaina Dozier PARAS, RN Outcome: Progressing Goal: Diagnostic test results will improve 04/08/2024 1138 by Alaina Dozier PARAS, RN Outcome: Adequate for Discharge 04/08/2024 0758 by Alaina Dozier PARAS, RN Outcome: Progressing Goal: Respiratory complications will improve Outcome: Adequate for Discharge Goal: Cardiovascular complication will be avoided Outcome: Adequate for Discharge   Problem: Activity: Goal: Risk for activity intolerance will decrease Outcome: Adequate for Discharge   Problem: Nutrition: Goal: Adequate nutrition will be maintained Outcome: Adequate for Discharge   Problem: Coping: Goal: Level of anxiety will decrease Outcome: Adequate for Discharge   Problem: Elimination: Goal: Will not experience complications related to bowel motility Outcome: Adequate for Discharge Goal: Will not experience complications related to urinary retention Outcome: Adequate for Discharge   Problem: Pain  Managment: Goal: General experience of comfort will improve and/or be controlled Outcome: Adequate for Discharge   Problem: Safety: Goal: Ability to remain free from injury will improve Outcome: Adequate for Discharge   Problem: Skin Integrity: Goal: Risk for impaired skin integrity will decrease Outcome: Adequate for Discharge   "

## 2024-04-08 NOTE — Progress Notes (Signed)
 IVs removed, dressed, belongings packed, instructions reviewed with understanding verbalized, transferred to front entrance via wheelchair.

## 2024-04-08 NOTE — Progress Notes (Signed)
 "    Subjective/Chief Complaint: Feels a little better   Objective: Vital signs in last 24 hours: Temp:  [98.1 F (36.7 C)-99 F (37.2 C)] 98.4 F (36.9 C) (12/25 0451) Pulse Rate:  [62-65] 62 (12/25 0451) Resp:  [15-18] 18 (12/25 0451) BP: (103-122)/(75-91) 122/75 (12/25 0451) SpO2:  [97 %-100 %] 97 % (12/25 0451) Last BM Date : 04/07/24  Intake/Output from previous day: 12/24 0701 - 12/25 0700 In: -  Out: 650 [Urine:650] Intake/Output this shift: No intake/output data recorded.  General appearance: alert and cooperative Resp: clear to auscultation bilaterally Cardio: regular rate and rhythm GI: soft, mild RLQ tenderness  Lab Results:  Recent Labs    04/07/24 0909 04/08/24 0547  WBC 4.8 5.8  HGB 13.4 14.2  HCT 39.0 41.9  PLT 218 212   BMET Recent Labs    04/07/24 0909 04/08/24 0547  NA 140 140  K 4.1 3.7  CL 103 101  CO2 30 27  GLUCOSE 80 88  BUN 6 8  CREATININE 1.02 1.01  CALCIUM  9.0 9.1   PT/INR No results for input(s): LABPROT, INR in the last 72 hours. ABG No results for input(s): PHART, HCO3 in the last 72 hours.  Invalid input(s): PCO2, PO2  Studies/Results: CT ABDOMEN PELVIS W CONTRAST Result Date: 04/06/2024 EXAM: CT ABDOMEN AND PELVIS WITH CONTRAST 04/06/2024 12:30:00 PM TECHNIQUE: CT of the abdomen and pelvis was performed with the administration of 100 mL of iohexol  (OMNIPAQUE ) 300 MG/ML solution. Multiplanar reformatted images are provided for review. Automated exposure control, iterative reconstruction, and/or weight-based adjustment of the mA/kV was utilized to reduce the radiation dose to as low as reasonably achievable. COMPARISON: CTs with contrast dated 04/02/2024 and 03/29/2024, and earlier CTA chest, abdomen, and pelvis 02/09/2024. CLINICAL HISTORY: RLQ abdominal pain onset 2 weeks ago, with findings consistent with appendicitis on two earlier studies from this month. FINDINGS: LOWER CHEST: No acute abnormality. LIVER:  The liver is unremarkable. GALLBLADDER AND BILE DUCTS: Gallbladder is unremarkable. No biliary ductal dilatation. SPLEEN: No acute abnormality. PANCREAS: No acute abnormality. ADRENAL GLANDS: No adrenal mass. 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 There is no renal mass. No stones in the kidneys or ureters. No hydronephrosis. No perinephric or periureteral stranding. Urinary bladder is unremarkable. GI AND BOWEL: Stomach demonstrates no acute abnormality. There is no small bowel obstruction or inflammation. The appendix is swollen and inflamed again measuring 1.3 cm although with a less pronounced inflammatory reaction than on both prior studies. Findings do remain consistent with acute appendicitis. There is moderate fecal stasis with unremarkable large bowel wall. There is no evidence of appendiceal rupture or abscess. Some thickening noted in the adjacent terminal ileal segment, but this is probably reactive. PERITONEUM AND RETROPERITONEUM: No free fluid, free hemorrhage, free air, or incarcerated hernia. VASCULATURE: The abdominal aorta is normal. 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 LYMPH NODES: No lymphadenopathy. REPRODUCTIVE ORGANS: No acute abnormality. BONES AND SOFT TISSUES: Mild levocoliosis and degenerative change of the lumbar spine with transitional anatomy lumbosacral junction. No acute or other significant osseous findings. No focal soft tissue abnormality. IMPRESSION: 1. Acute appendicitis, with less pronounced inflammatory reaction than on prior studies. No evidence of appendiceal rupture or abscess. 2. 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 Electronically signed by: Francis Quam MD 04/06/2024 11:03 PM EST RP Workstation: [id number]    Anti-infectives: Anti-infectives (From admission, onward)  Start     Dose/Rate Route Frequency Ordered Stop   04/08/24 1030  amoxicillin -clavulanate  (AUGMENTIN ) 875-125 MG per tablet 1 tablet        1 tablet Oral Every 12 hours 04/08/24 0938     04/04/24 2000  piperacillin -tazobactam (ZOSYN ) IVPB 3.375 g  Status:  Discontinued        3.375 g 12.5 mL/hr over 240 Minutes Intravenous Every 8 hours 04/04/24 1848 04/08/24 9061       Assessment/Plan: s/p * No surgery found * Advance diet Appendicitis Plan to switch to oral abx today. Will also plan for interval appendectomy in next 2-3 weeks Ok for d/c from surgical standpoint once medically stable  LOS: 4 days    Deward Null III 04/08/2024  "

## 2024-04-08 NOTE — Plan of Care (Signed)

## 2024-04-08 NOTE — Discharge Summary (Signed)
 Physician Discharge Summary  Ronald [name] FMW:[id number] DOB: 1980-08-03 DOA: 04/04/2024  PCP: Joshua Debby CROME, MD  Admit date: 04/04/2024 Discharge date: 04/08/2024  Admitted From: Home Disposition: Home  Recommendations for Outpatient Follow-up:  Follow up with PCP in 1 week with repeat CBC/BMP Outpatient follow-up with general surgery.  Diet as per general surgery recommendations Follow up in ED if symptoms worsen or new appear   Home Health: No Equipment/Devices: None  Discharge Condition: Stable CODE STATUS: Full Diet recommendation: As per general surgery recommendations: Soft diet  Brief/Interim Summary: 43 y.o. male with medical history significant of PTSD, bipolar disorder, insomnia, chronic anxiety and benzodiazepine dependence presented to emergency department with complaining of continuation of right lower quadrant pain in the setting of chronic appendicitis.  Patient was admitted 4 days ago and discharged home after being discussed with general surgery being planned to to treat conservatively as repeat CT abdomen pelvis did not show any evidence of abdominal abscess/perforation.  Patient was initially treated with IV antibiotics and transitioned to Augmentin  to complete 7 to 10 days course and plan for outpatient schedule/surgery in 6-week.  General surgery was reconsulted.  Patient was started on IV Zosyn .  Subsequently, CT repeated on 04/06/2024 showed improving appendicitis as per general surgery.  He is currently tolerating diet, afebrile and hemodynamically stable.  General surgery has cleared the patient for discharge home today on oral antibiotics with outpatient follow-up with general surgery for possible appendectomy.  Discharge patient home today on oral Augmentin .  Discharge Diagnoses:   Chronic appendicitis - General Surgery following: CT repeated on 04/06/2024 shows improving appendicitis as per general surgery.   - Treated with IV Zosyn .   -He is  currently tolerating diet, afebrile and hemodynamically stable.  General surgery has cleared the patient for discharge home today on oral antibiotics with outpatient follow-up with general surgery for possible appendectomy.  Discharge patient home today on oral Augmentin  for 2 more weeks.  Patient is to follow-up with general surgery as an outpatient to see if he needs more antibiotics.  Continue oral pain medications on discharge.   History of PTSD/bipolar disorder/generalized anxiety disorder with intermittent agitation - Continue Depakote  - Patient was extremely angry/belligerent and upset on 04/06/2024.  Psychiatry evaluation appreciated.  Continue Depakote : Outpatient follow-up with psychiatry.  No need for inpatient psychiatric hospitalization.    Hyponatremia - Resolved   Discharge Instructions  Discharge Instructions     Diet general   Complete by: As directed    As per general surgery: soft diet   Increase activity slowly   Complete by: As directed       Allergies as of 04/08/2024       Reactions   Vistaril  [hydroxyzine ] Itching, Anxiety, Other (See Comments)   Hyperactivity    Alupent [metaproterenol] Other (See Comments)   Hyperactivity   Seroquel  [quetiapine ] Other (See Comments)   Hallucinations   Zyprexa  [olanzapine ] Other (See Comments)   made patient feel weird   Antihistamines, Chlorpheniramine-type Anxiety, Other (See Comments)   made patient feel insane - all antihistamines   Antihistamines, Diphenhydramine -type Anxiety, Other (See Comments)   made patient feel insane - all antihistamines   Antihistamines, Loratadine-type Anxiety, Other (See Comments)   made patient feel insane - all antihistamines   Clonidine And Derivatives Anxiety, Other (See Comments)   Caused a red/flushed face and had a panic attack        Medication List     TAKE these medications    acetaminophen  500  MG tablet Commonly known as: TYLENOL  Take 2 tablets (1,000 mg  total) by mouth every 6 (six) hours. What changed:  medication strength how much to take when to take this reasons to take this   amoxicillin -clavulanate 875-125 MG tablet Commonly known as: AUGMENTIN  Take 1 tablet by mouth 2 (two) times daily for 15 days.   divalproex  500 MG DR tablet Commonly known as: DEPAKOTE  Take 2 tablets (1,000 mg total) by mouth 2 (two) times daily. What changed:  when to take this additional instructions   gabapentin  100 MG capsule Commonly known as: NEURONTIN  Take 2 capsules (200 mg total) by mouth 3 (three) times daily. What changed:  when to take this additional instructions   ibuprofen  200 MG tablet Commonly known as: ADVIL  Take 800 mg by mouth daily as needed (for pain or headaches).   Mens Multivitamin Tabs Take 1 tablet by mouth every 7 (seven) days.   methocarbamol  500 MG tablet Commonly known as: ROBAXIN  Take 1 tablet (500 mg total) by mouth every 8 (eight) hours as needed for muscle spasms.   ondansetron  4 MG tablet Commonly known as: Zofran  Take 1 tablet (4 mg total) by mouth daily as needed for nausea or vomiting.   oxyCODONE  5 MG immediate release tablet Commonly known as: Oxy IR/ROXICODONE  Take 1-2 tablets (5-10 mg total) by mouth every 4 (four) hours as needed for severe pain (pain score 7-10) or moderate pain (pain score 4-6).   polycarbophil 625 MG tablet Commonly known as: FIBERCON Take 1 tablet (625 mg total) by mouth 2 (two) times daily.   polyethylene glycol 17 g packet Commonly known as: MiraLax  Take 17 g by mouth daily as needed for mild constipation.   traZODone  50 MG tablet Commonly known as: DESYREL  Take 1 tablet (50 mg total) by mouth at bedtime.   vitamin C 1000 MG tablet Take 1,000 mg by mouth daily with breakfast.   VITAMIN D  PO Take 10,000 Units by mouth daily with breakfast.        Follow-up Information     Joshua Debby CROME, MD. Schedule an appointment as soon as possible for a visit in 1  week(s).   Specialty: Internal Medicine Contact information: [address] [name] [address] [phone]         [name], MD. Schedule an appointment as soon as possible for a visit.   Specialty: General Surgery Contact information: [address] [place] [address] [name] [address] [phone]                Allergies[1]  Consultations: General Surgery/psychiatry   Procedures/Studies: CT ABDOMEN PELVIS W CONTRAST Result Date: 04/06/2024 EXAM: CT ABDOMEN AND PELVIS WITH CONTRAST 04/06/2024 12:30:00 PM TECHNIQUE: CT of the abdomen and pelvis was performed with the administration of 100 mL of iohexol  (OMNIPAQUE ) 300 MG/ML solution. Multiplanar reformatted images are provided for review. Automated exposure control, iterative reconstruction, and/or weight-based adjustment of the mA/kV was utilized to reduce the radiation dose to as low as reasonably achievable. COMPARISON: CTs with contrast dated 04/02/2024 and 03/29/2024, and earlier CTA chest, abdomen, and pelvis 02/09/2024. CLINICAL HISTORY: RLQ abdominal pain onset 2 weeks ago, with findings consistent with appendicitis on two earlier studies from this month. FINDINGS: LOWER CHEST: No acute abnormality. LIVER: The liver is unremarkable. GALLBLADDER AND BILE DUCTS: Gallbladder is unremarkable. No biliary ductal dilatation. SPLEEN: No acute abnormality. PANCREAS: No acute abnormality. ADRENAL GLANDS: No adrenal mass. 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 There is no renal mass. No stones in the kidneys or ureters. No hydronephrosis. No perinephric or periureteral stranding. Urinary bladder is unremarkable. GI AND BOWEL: Stomach demonstrates no acute abnormality. There is no small bowel obstruction or inflammation. The appendix is swollen and inflamed again measuring 1.3 cm although with a less pronounced inflammatory reaction than on both prior studies. Findings do  remain consistent with acute appendicitis. There is moderate fecal stasis with unremarkable large bowel wall. There is no evidence of appendiceal rupture or abscess. Some thickening noted in the adjacent terminal ileal segment, but this is probably reactive. PERITONEUM AND RETROPERITONEUM: No free fluid, free hemorrhage, free air, or incarcerated hernia. VASCULATURE: The abdominal aorta is normal. 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 LYMPH NODES: No lymphadenopathy. REPRODUCTIVE ORGANS: No acute abnormality. BONES AND SOFT TISSUES: Mild levocoliosis and degenerative change of the lumbar spine with transitional anatomy lumbosacral junction. No acute or other significant osseous findings. No focal soft tissue abnormality. IMPRESSION: 1. Acute appendicitis, with less pronounced inflammatory reaction than on prior studies. No evidence of appendiceal rupture or abscess. 2. 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 Electronically signed by: Francis Quam MD 04/06/2024 11:03 PM EST RP Workstation: [id number]   DG ABD ACUTE 2+V W 1V CHEST Result Date: 04/05/2024 CLINICAL DATA:  Evaluate for free air, history of right lower quadrant pain EXAM: DG ABDOMEN ACUTE WITH 1 VIEW CHEST COMPARISON:  CT from 04/02/2024 FINDINGS: Cardiac shadow is within normal limits. The lungs are well aerated bilaterally. No focal infiltrate or effusion is seen. Scattered large and small bowel gas is noted. No obstructive changes are seen. No mass effect is noted. No free air is noted. No bony abnormality is seen. IMPRESSION: No acute abnormality in the chest and abdomen. Specifically no findings to suggest free air are noted. Electronically Signed   By: Oneil Devonshire M.D.   On: 04/05/2024 00:39   CT ABDOMEN PELVIS W CONTRAST Result Date: 04/02/2024 CLINICAL DATA:  Right lower quadrant pain for several days, history of abnormal appendix on prior exam. EXAM: CT ABDOMEN AND PELVIS WITH  CONTRAST TECHNIQUE: Multidetector CT imaging of the abdomen and pelvis was performed using the standard protocol following bolus administration of intravenous contrast. RADIATION DOSE REDUCTION: This exam was performed according to the departmental dose-optimization program which includes automated exposure control, adjustment of the mA and/or kV according to patient size and/or use of iterative reconstruction technique. CONTRAST:  OMNIPAQUE  IOHEXOL  300 MG/ML  SOLN COMPARISON:  03/29/2024. FINDINGS: Lower chest: No acute abnormality. Hepatobiliary: No focal liver abnormality is seen. No gallstones, gallbladder wall thickening, or biliary dilatation. Pancreas: Unremarkable. No pancreatic ductal dilatation or surrounding inflammatory changes. Spleen: Normal in size without focal abnormality. Adrenals/Urinary Tract: Adrenal glands are unremarkable. Kidneys are normal, without renal calculi, focal lesion, or hydronephrosis. Bladder is unremarkable. Stomach/Bowel: No obstructive or inflammatory changes of the colon are noted. There remains dilatation of the appendix with surrounding inflammatory change. Some slight decrease in inflammatory change is noted no definitive signs of perforation or abscess formation are noted at this time. Small bowel and stomach are within normal limits. Vascular/Lymphatic: No significant vascular findings are present. No enlarged abdominal or pelvic lymph nodes. Reproductive: Prostate is unremarkable. Other: No abdominal wall hernia or abnormality. No abdominopelvic ascites. Musculoskeletal: No acute or significant osseous findings. IMPRESSION: Changes consistent with acute appendicitis with very minimal decrease in inflammatory changes. No findings to suggest  perforation or abscess formation are noted at this time. Electronically Signed   By: Oneil Devonshire M.D.   On: 04/02/2024 20:25   CT ABDOMEN PELVIS W CONTRAST Result Date: 03/29/2024 EXAM: CT ABDOMEN AND PELVIS WITH CONTRAST  03/29/2024 05:48:36 PM TECHNIQUE: CT of the abdomen and pelvis was performed with the administration of 100 mL of iohexol  (OMNIPAQUE ) 300 MG/ML solution. Multiplanar reformatted images are provided for review. Automated exposure control, iterative reconstruction, and/or weight-based adjustment of the mA/kV was utilized to reduce the radiation dose to as low as reasonably achievable. COMPARISON: 02/09/2024. CLINICAL HISTORY: RLQ abdominal pain; possible appendicitis. FINDINGS: LOWER CHEST: Bilateral lower lobe atelectasis. LIVER: The liver is unremarkable. GALLBLADDER AND BILE DUCTS: Gallbladder is unremarkable. No biliary ductal dilatation. SPLEEN: No acute abnormality. PANCREAS: No acute abnormality. ADRENAL GLANDS: No acute abnormality. KIDNEYS, URETERS AND BLADDER: No stones in the kidneys or ureters. No hydronephrosis. No perinephric or periureteral stranding. The bladder is mildly thick-walled but underdistended. GI AND BOWEL: Stomach demonstrates no acute abnormality. Abnormal appendix, measuring up to 16 mm with periappendiceal stranding (image 95), reflecting acute appendicitis. Suspected mild secondary inflammatory changes involving the adjacent terminal ileum. There is no bowel obstruction. PERITONEUM AND RETROPERITONEUM: No drainable fluid collection/abscess. No free air. VASCULATURE: Aorta is normal in caliber. LYMPH NODES: No lymphadenopathy. REPRODUCTIVE ORGANS: The prostate is unremarkable. BONES AND SOFT TISSUES: Mild degenerative changes of the lumbar spine. No acute osseous abnormality. No focal soft tissue abnormality. IMPRESSION: 1. Acute appendicitis. No drainable fluid collection or free air. 2. Suspected mild secondary inflammatory changes involving the adjacent terminal ileum. Electronically signed by: Pinkie Pebbles MD 03/29/2024 06:34 PM EST RP Workstation: [id number]      Subjective: Patient seen and examined at bedside.  Tolerating diet and pain better controlled with current pain  medications.  No fever or vomiting reported.  Wants to go home today.  Discharge Exam: Vitals:   04/07/24 1943 04/08/24 0451  BP: 119/78 122/75  Pulse: 65 62  Resp: 18 18  Temp: 99 F (37.2 C) 98.4 F (36.9 C)  SpO2: 100% 97%    General: Pt is alert, awake, not in acute distress.  Calm this morning.  On room air. Cardiovascular: rate controlled, S1/S2 + Respiratory: bilateral decreased breath sounds at bases Abdominal: Soft, mildly tender in the right lower quadrant, slightly distended, bowel sounds + Extremities: no edema, no cyanosis    The results of significant diagnostics from this hospitalization (including imaging, microbiology, ancillary and laboratory) are listed below for reference.     Microbiology: No results found for this or any previous visit (from the past 240 hours).   Labs: BNP (last 3 results) No results for input(s): BNP in the last 8760 hours. Basic Metabolic Panel: Recent Labs  Lab 04/04/24 1657 04/06/24 0540 04/07/24 0909 04/08/24 0547  NA 134* 142 140 140  K 4.5 4.1 4.1 3.7  CL 99 106 103 101  CO2 25 25 30 27   GLUCOSE 108* 89 80 88  BUN 10 10 6 8   CREATININE 0.82 0.90 1.02 1.01  CALCIUM  9.1 9.2 9.0 9.1  MG  --   --  2.0 2.2   Liver Function Tests: Recent Labs  Lab 04/04/24 1657 04/07/24 0909  AST 29 15  ALT 19 14  ALKPHOS 44 39  BILITOT 0.3 0.4  PROT 6.8 5.8*  ALBUMIN 4.2 3.7   No results for input(s): LIPASE, AMYLASE in the last 168 hours. No results for input(s): AMMONIA in the last 168 hours. CBC:  Recent Labs  Lab 04/04/24 1657 04/05/24 0636 04/06/24 0540 04/07/24 0909 04/08/24 0547  WBC 5.4 4.1 6.6 4.8 5.8  NEUTROABS  --   --   --  2.1 2.4  HGB 14.2 13.4 13.6 13.4 14.2  HCT 40.6 39.2 39.4 39.0 41.9  MCV 88.5 89.7 89.5 89.0 89.9  PLT 243 194 219 218 212   Cardiac Enzymes: No results for input(s): CKTOTAL, CKMB, CKMBINDEX, TROPONINI in the last 168 hours. BNP: Invalid input(s): POCBNP CBG: No  results for input(s): GLUCAP in the last 168 hours. D-Dimer No results for input(s): DDIMER in the last 72 hours. Hgb A1c No results for input(s): HGBA1C in the last 72 hours. Lipid Profile No results for input(s): CHOL, HDL, LDLCALC, TRIG, CHOLHDL, LDLDIRECT in the last 72 hours. Thyroid  function studies No results for input(s): TSH, T4TOTAL, T3FREE, THYROIDAB in the last 72 hours.  Invalid input(s): FREET3 Anemia work up No results for input(s): VITAMINB12, FOLATE, FERRITIN, TIBC, IRON, RETICCTPCT in the last 72 hours. Urinalysis    Component Value Date/Time   COLORURINE AMBER (A) 04/04/2024 1935   APPEARANCEUR CLEAR 04/04/2024 1935   LABSPEC 1.032 (H) 04/04/2024 1935   PHURINE 5.0 04/04/2024 1935   GLUCOSEU NEGATIVE 04/04/2024 1935   HGBUR NEGATIVE 04/04/2024 1935   BILIRUBINUR NEGATIVE 04/04/2024 1935   KETONESUR 5 (A) 04/04/2024 1935   PROTEINUR 30 (A) 04/04/2024 1935   UROBILINOGEN 0.2 08/08/2013 1342   NITRITE NEGATIVE 04/04/2024 1935   LEUKOCYTESUR TRACE (A) 04/04/2024 1935   Sepsis Labs Recent Labs  Lab 04/05/24 0636 04/06/24 0540 04/07/24 0909 04/08/24 0547  WBC 4.1 6.6 4.8 5.8   Microbiology No results found for this or any previous visit (from the past 240 hours).   Time coordinating discharge: 35 minutes  SIGNED:   Sophie Mao, MD  [facility] 04/08/2024, 11:16 AM      [1]  Allergies Allergen Reactions   Vistaril  [Hydroxyzine ] Itching, Anxiety and Other (See Comments)    Hyperactivity    Alupent [Metaproterenol] Other (See Comments)    Hyperactivity   Seroquel  [Quetiapine ] Other (See Comments)    Hallucinations    Zyprexa  [Olanzapine ] Other (See Comments)    made patient feel weird   Antihistamines, Chlorpheniramine-Type Anxiety and Other (See Comments)    made patient feel insane - all antihistamines   Antihistamines, Diphenhydramine -Type Anxiety and Other (See Comments)    made  patient feel insane - all antihistamines   Antihistamines, Loratadine-Type Anxiety and Other (See Comments)    made patient feel insane - all antihistamines   Clonidine And Derivatives Anxiety and Other (See Comments)    Caused a red/flushed face and had a panic attack

## 2024-04-08 NOTE — Discharge Instructions (Signed)
 [facility] - Dr Yvonne office - [phone] will be calling you to schedule you for laparoscopic appendectomy with Dr Curvin for January 2026 If you have not heard from our office by Dec 30th, please call the office to speak with our surgery scheduling department

## 2024-04-12 ENCOUNTER — Observation Stay (HOSPITAL_COMMUNITY)
Admission: EM | Admit: 2024-04-12 | Discharge: 2024-04-18 | Disposition: A | Discharge status: Home / Self Care | Payer: Self-pay | Attending: General Surgery | Admitting: General Surgery

## 2024-04-12 DIAGNOSIS — K3580 Unspecified acute appendicitis: Secondary | ICD-10-CM | POA: Diagnosis present

## 2024-04-12 DIAGNOSIS — F1290 Cannabis use, unspecified, uncomplicated: Secondary | ICD-10-CM | POA: Insufficient documentation

## 2024-04-12 DIAGNOSIS — K36 Other appendicitis: Principal | ICD-10-CM

## 2024-04-12 DIAGNOSIS — K3589 Other acute appendicitis without perforation or gangrene: Principal | ICD-10-CM | POA: Insufficient documentation

## 2024-04-12 HISTORY — DX: Other complications of anesthesia, initial encounter: T88.59XA

## 2024-04-12 LAB — COMPREHENSIVE METABOLIC PANEL WITH GFR
ALT: 12 U/L (ref 0–44)
AST: 17 U/L (ref 15–41)
Albumin: 4.4 g/dL (ref 3.5–5.0)
Alkaline Phosphatase: 47 U/L (ref 38–126)
Anion gap: 9 (ref 5–15)
BUN: 9 mg/dL (ref 6–20)
CO2: 29 mmol/L (ref 22–32)
Calcium: 9.1 mg/dL (ref 8.9–10.3)
Chloride: 100 mmol/L (ref 98–111)
Creatinine, Ser: 0.87 mg/dL (ref 0.61–1.24)
GFR, Estimated: >60 mL/min
Glucose, Bld: 85 mg/dL (ref 70–99)
Potassium: 4.3 mmol/L (ref 3.5–5.1)
Sodium: 138 mmol/L (ref 135–145)
Total Bilirubin: 0.3 mg/dL (ref 0.0–1.2)
Total Protein: 6.7 g/dL (ref 6.5–8.1)

## 2024-04-12 LAB — CBC
HCT: 40.9 % (ref 39.0–52.0)
Hemoglobin: 14.4 g/dL (ref 13.0–17.0)
MCH: 31.3 pg (ref 26.0–34.0)
MCHC: 35.2 g/dL (ref 30.0–36.0)
MCV: 88.9 fL (ref 80.0–100.0)
Platelets: 240 K/uL (ref 150–400)
RBC: 4.6 MIL/uL (ref 4.22–5.81)
RDW: 12.3 % (ref 11.5–15.5)
WBC: 6.6 K/uL (ref 4.0–10.5)
nRBC: 0 % (ref 0.0–0.2)

## 2024-04-12 LAB — LIPASE, BLOOD: Lipase: 19 U/L (ref 11–51)

## 2024-04-12 NOTE — Progress Notes (Signed)
 Date of COVID positive in last 90 days:  PCP - Debby Molt, MD Cardiologist -   Chest x-ray - 02-09-24 Epic EKG - 02-10-24 Epic Stress Test - N/A ECHO - 02-10-24 Epic Cardiac Cath - N/A Pacemaker/ICD device last checked:N/A Spinal Cord Stimulator:N/A  Bowel Prep - N/A  Sleep Study - N/A CPAP -   Fasting Blood Sugar - N/A Checks Blood Sugar _____ times a day  Last dose of GLP1 agonist-  N/A GLP1 instructions:  Do not take after     Last dose of SGLT-2 inhibitors-  N/A SGLT-2 instructions:  Do not take after     Blood Thinner Instructions: N/A Last dose:   Time: Aspirin  Instructions:N/A Last Dose:  Activity level:  Can go up a flight of stairs and perform activities of daily living without stopping and without symptoms of chest pain or shortness of breath.  Able to exercise without symptoms  Unable to go up a flight of stairs without symptoms of     Anesthesia review: N/A  Patient denies shortness of breath, fever, cough and chest pain at PAT appointment  Patient verbalized understanding of instructions that were given to them at the PAT appointment. Patient was also instructed that they will need to review over the PAT instructions again at home before surgery.

## 2024-04-12 NOTE — ED Notes (Signed)
 Patient about pain meds. Triage nurse is aware

## 2024-04-12 NOTE — Patient Instructions (Addendum)
 SURGICAL WAITING ROOM VISITATION Patients having surgery or a procedure may have no more than 2 support people in the waiting area - these visitors may rotate.    Children under the age of 82 will not be allowed to visit due to the increase in respiratory illness  Children under the age of 34 must have an adult with them who is not the patient.  If the patient needs to stay at the hospital during part of their recovery, the visitor guidelines for inpatient rooms apply. Pre-op nurse will coordinate an appropriate time for 1 support person to accompany patient in pre-op.  This support person may not rotate.    Please refer to the [facility] website for the visitor guidelines for Inpatients (after your surgery is over and you are in a regular room).       Your procedure is scheduled on: 04-20-24   Report to [facility] Main Entrance    Report to admitting at 5:15 AM   Call this number if you have problems the morning of surgery [phone]   Do not eat food :After Midnight.   After Midnight you may have the following liquids until 4:30 AM DAY OF SURGERY  Water  Non-Citrus Juices (without pulp, NO RED-Apple, White grape, White cranberry) Black Coffee (NO MILK/CREAM OR CREAMERS, sugar ok)  Clear Tea (NO MILK/CREAM OR CREAMERS, sugar ok) regular and decaf                             Plain Jell-O (NO RED)                                           Fruit ices (not with fruit pulp, NO RED)                                     Popsicles (NO RED)                                                               Sports drinks like Gatorade (NO RED)                     If you have questions, please contact your surgeons office.   FOLLOW  ANY ADDITIONAL PRE OP INSTRUCTIONS YOU RECEIVED FROM YOUR SURGEON'S OFFICE!!!     Oral Hygiene is also important to reduce your risk of infection.                                    Remember - BRUSH YOUR TEETH THE MORNING OF SURGERY WITH YOUR REGULAR  TOOTHPASTE   Do NOT smoke after Midnight   Take these medicines the morning of surgery with A SIP OF WATER :    Augmentin    Divalproex    Gabapentin    If needed Tylenol , Ondansetron , Oxycodone   Stop all vitamins and herbal supplements 7 days before surgery  Bring CPAP mask and tubing day of surgery.  You may not have any metal on your body including  jewelry, and body piercing             Do not wear  lotions, powders,  cologne, or deodorant              Men may shave face and neck.   Do not bring valuables to the hospital. [facility] IS NOT RESPONSIBLE   FOR VALUABLES.   Contacts, dentures or bridgework may not be worn into surgery.   Bring small overnight bag day of surgery.   DO NOT BRING YOUR HOME MEDICATIONS TO THE HOSPITAL. PHARMACY WILL DISPENSE MEDICATIONS LISTED ON YOUR MEDICATION LIST TO YOU DURING YOUR ADMISSION IN THE HOSPITAL!    Special Instructions: Bring a copy of your healthcare power of attorney and living will documents the day of surgery if you haven't scanned them before.              Please read over the following fact sheets you were given: IF YOU HAVE QUESTIONS ABOUT YOUR PRE-OP INSTRUCTIONS PLEASE CALL [phone] [place]  If you received a COVID test during your pre-op visit  it is requested that you wear a mask when out in public, stay away from anyone that may not be feeling well and notify your surgeon if you develop symptoms. If you test positive for Covid or have been in contact with anyone that has tested positive in the last 10 days please notify you surgeon. [facility] - Preparing for Surgery Before surgery, you can play an important role.  Because skin is not sterile, your skin needs to be as free of germs as possible.  You can reduce the number of germs on your skin by washing with CHG (chlorahexidine gluconate) soap before surgery.  CHG is an antiseptic cleaner which kills germs and bonds with the skin to continue  killing germs even after washing. Please DO NOT use if you have an allergy to CHG or antibacterial soaps.  If your skin becomes reddened/irritated stop using the CHG and inform your nurse when you arrive at Short Stay. Do not shave (including legs and underarms) for at least 48 hours prior to the first CHG shower.  You may shave your face/neck.  Please follow these instructions carefully:  1.  Shower with CHG Soap the night before surgery ONLY (DO NOT USE THE SOAP THE MORNING OF SURGERY).  2.  If you choose to wash your hair, wash your hair first as usual with your normal  shampoo.  3.  After you shampoo, rinse your hair and body thoroughly to remove the shampoo.                             4.  Use CHG as you would any other liquid soap.  You can apply chg directly to the skin and wash.  Gently with a scrungie or clean washcloth.  5.  Apply the CHG Soap to your body ONLY FROM THE NECK DOWN.   Do   not use on face/ open                           Wound or open sores. Avoid contact with eyes, ears mouth and   genitals (private parts).                       Wash face,  Genitals (  private parts) with your normal soap.             6.  Wash thoroughly, paying special attention to the area where your    surgery  will be performed.  7.  Thoroughly rinse your body with warm water  from the neck down.  8.  DO NOT shower/wash with your normal soap after using and rinsing off the CHG Soap.                9.  Pat yourself dry with a clean towel.            10.  Wear clean pajamas.            11.  Place clean sheets on your bed the night of your first shower and do not  sleep with pets. Day of Surgery : Do not apply any CHG, lotions/deodorants the morning of surgery.  Please wear clean clothes to the hospital/surgery center.  FAILURE TO FOLLOW THESE INSTRUCTIONS MAY RESULT IN THE CANCELLATION OF YOUR SURGERY  PATIENT SIGNATURE_________________________________  NURSE  SIGNATURE__________________________________  ________________________________________________________________________

## 2024-04-12 NOTE — ED Triage Notes (Signed)
 Pt BIB [facility] fro right sided abdominal pain and vomiting that has been intermittent x 1 month but worsened x 2 days

## 2024-04-13 ENCOUNTER — Emergency Department (HOSPITAL_COMMUNITY): Payer: Self-pay

## 2024-04-13 ENCOUNTER — Inpatient Hospital Stay: Payer: Self-pay | Admitting: Emergency Medicine

## 2024-04-13 ENCOUNTER — Encounter (HOSPITAL_COMMUNITY)
Admission: RE | Admit: 2024-04-13 | Discharge: 2024-04-13 | Disposition: A | Discharge status: Home / Self Care | Payer: Self-pay | Source: Ambulatory Visit | Attending: Internal Medicine | Admitting: Internal Medicine

## 2024-04-13 ENCOUNTER — Other Ambulatory Visit: Payer: Self-pay

## 2024-04-13 ENCOUNTER — Telehealth: Payer: Self-pay

## 2024-04-13 LAB — CBC WITH DIFFERENTIAL/PLATELET
Abs Immature Granulocytes: 0.01 K/uL (ref 0.00–0.07)
Basophils Absolute: 0 K/uL (ref 0.0–0.1)
Basophils Relative: 0 %
Eosinophils Absolute: 0 K/uL (ref 0.0–0.5)
Eosinophils Relative: 0 %
HCT: 44.1 % (ref 39.0–52.0)
Hemoglobin: 15.3 g/dL (ref 13.0–17.0)
Immature Granulocytes: 0 %
Lymphocytes Relative: 18 %
Lymphs Abs: 1.5 K/uL (ref 0.7–4.0)
MCH: 30.8 pg (ref 26.0–34.0)
MCHC: 34.7 g/dL (ref 30.0–36.0)
MCV: 88.7 fL (ref 80.0–100.0)
Monocytes Absolute: 0.6 K/uL (ref 0.1–1.0)
Monocytes Relative: 7 %
Neutro Abs: 6.4 K/uL (ref 1.7–7.7)
Neutrophils Relative %: 75 %
Platelets: 307 K/uL (ref 150–400)
RBC: 4.97 MIL/uL (ref 4.22–5.81)
RDW: 12.4 % (ref 11.5–15.5)
WBC: 8.6 K/uL (ref 4.0–10.5)
nRBC: 0 % (ref 0.0–0.2)

## 2024-04-13 MED ORDER — DIVALPROEX SODIUM 250 MG PO DR TAB
1000.0000 mg | DELAYED_RELEASE_TABLET | Freq: Two times a day (BID) | ORAL | Status: DC
  Administered 2024-04-13 – 2024-04-18 (×10): 1000 mg via ORAL
  Filled 2024-04-13 (×10): qty 4

## 2024-04-13 MED ORDER — OXYCODONE HCL 5 MG PO TABS
5.0000 mg | ORAL_TABLET | ORAL | Status: DC | PRN
  Administered 2024-04-13: 5 mg via ORAL
  Administered 2024-04-13 – 2024-04-15 (×5): 10 mg via ORAL
  Administered 2024-04-15: 5 mg via ORAL
  Administered 2024-04-15 – 2024-04-18 (×13): 10 mg via ORAL
  Filled 2024-04-13 (×2): qty 2
  Filled 2024-04-13: qty 1
  Filled 2024-04-13 (×2): qty 2
  Filled 2024-04-13: qty 1
  Filled 2024-04-13 (×14): qty 2

## 2024-04-13 MED ORDER — GABAPENTIN 100 MG PO CAPS
200.0000 mg | ORAL_CAPSULE | Freq: Three times a day (TID) | ORAL | Status: DC
  Administered 2024-04-13 – 2024-04-18 (×15): 200 mg via ORAL
  Filled 2024-04-13 (×15): qty 2

## 2024-04-13 MED ORDER — SODIUM CHLORIDE 0.9 % IV SOLN
2.0000 g | INTRAVENOUS | Status: DC
  Administered 2024-04-13: 2 g via INTRAVENOUS
  Filled 2024-04-13: qty 20

## 2024-04-13 MED ORDER — MORPHINE SULFATE (PF) 4 MG/ML IV SOLN
4.0000 mg | Freq: Once | INTRAVENOUS | Status: AC
  Administered 2024-04-13: 4 mg via INTRAVENOUS
  Filled 2024-04-13: qty 1

## 2024-04-13 MED ORDER — LACTATED RINGERS IV BOLUS
1000.0000 mL | Freq: Once | INTRAVENOUS | Status: AC
  Administered 2024-04-13: 1000 mL via INTRAVENOUS

## 2024-04-13 MED ORDER — ENOXAPARIN SODIUM 40 MG/0.4ML IJ SOSY
40.0000 mg | PREFILLED_SYRINGE | INTRAMUSCULAR | Status: DC
  Administered 2024-04-14 – 2024-04-15 (×2): 40 mg via SUBCUTANEOUS
  Filled 2024-04-13 (×4): qty 0.4

## 2024-04-13 MED ORDER — LACTATED RINGERS IV SOLN: INTRAVENOUS | Status: AC

## 2024-04-13 MED ORDER — ONDANSETRON 4 MG PO TBDP
4.0000 mg | ORAL_TABLET | Freq: Once | ORAL | Status: AC
  Administered 2024-04-13: 4 mg via ORAL
  Filled 2024-04-13: qty 1

## 2024-04-13 MED ORDER — HYDROMORPHONE HCL 1 MG/ML IJ SOLN
0.5000 mg | INTRAMUSCULAR | Status: DC | PRN
  Administered 2024-04-13 – 2024-04-14 (×4): 1 mg via INTRAVENOUS
  Administered 2024-04-14: 0.5 mg via INTRAVENOUS
  Administered 2024-04-14 – 2024-04-15 (×3): 1 mg via INTRAVENOUS
  Filled 2024-04-13 (×8): qty 1

## 2024-04-13 MED ORDER — IOHEXOL 300 MG/ML  SOLN
100.0000 mL | Freq: Once | INTRAMUSCULAR | Status: AC | PRN
  Administered 2024-04-13: 100 mL via INTRAVENOUS

## 2024-04-13 MED ORDER — DIVALPROEX SODIUM 500 MG PO DR TAB
1000.0000 mg | DELAYED_RELEASE_TABLET | Freq: Once | ORAL | Status: AC
  Administered 2024-04-13: 1000 mg via ORAL
  Filled 2024-04-13: qty 2

## 2024-04-13 MED ORDER — AMOXICILLIN-POT CLAVULANATE 875-125 MG PO TABS
1.0000 | ORAL_TABLET | Freq: Once | ORAL | Status: AC
  Administered 2024-04-13: 1 via ORAL
  Filled 2024-04-13: qty 1

## 2024-04-13 MED ORDER — ACETAMINOPHEN 500 MG PO TABS
1000.0000 mg | ORAL_TABLET | Freq: Four times a day (QID) | ORAL | Status: DC
  Administered 2024-04-13 – 2024-04-18 (×15): 1000 mg via ORAL
  Filled 2024-04-13 (×17): qty 2

## 2024-04-13 MED ORDER — METRONIDAZOLE 500 MG/100ML IV SOLN
500.0000 mg | Freq: Two times a day (BID) | INTRAVENOUS | Status: DC
  Administered 2024-04-13 – 2024-04-14 (×2): 500 mg via INTRAVENOUS
  Filled 2024-04-13 (×2): qty 100

## 2024-04-13 MED ORDER — DOCUSATE SODIUM 100 MG PO CAPS
100.0000 mg | ORAL_CAPSULE | Freq: Two times a day (BID) | ORAL | Status: DC
  Administered 2024-04-13 – 2024-04-18 (×9): 100 mg via ORAL
  Filled 2024-04-13 (×9): qty 1

## 2024-04-13 MED ORDER — GABAPENTIN 100 MG PO CAPS
200.0000 mg | ORAL_CAPSULE | Freq: Once | ORAL | Status: AC
  Administered 2024-04-13: 200 mg via ORAL
  Filled 2024-04-13: qty 2

## 2024-04-13 MED ORDER — ONDANSETRON HCL 4 MG/2ML IJ SOLN
4.0000 mg | Freq: Four times a day (QID) | INTRAMUSCULAR | Status: DC | PRN
  Administered 2024-04-13 – 2024-04-14 (×3): 4 mg via INTRAVENOUS
  Filled 2024-04-13 (×3): qty 2

## 2024-04-13 MED ORDER — OXYCODONE HCL 5 MG PO TABS
5.0000 mg | ORAL_TABLET | Freq: Once | ORAL | Status: AC
  Administered 2024-04-13: 5 mg via ORAL
  Filled 2024-04-13: qty 1

## 2024-04-13 MED ORDER — ONDANSETRON HCL 4 MG/2ML IJ SOLN
4.0000 mg | Freq: Once | INTRAMUSCULAR | Status: AC
  Administered 2024-04-13: 4 mg via INTRAVENOUS
  Filled 2024-04-13: qty 2

## 2024-04-13 MED ORDER — TRAZODONE HCL 50 MG PO TABS
50.0000 mg | ORAL_TABLET | Freq: Every day | ORAL | Status: DC
  Administered 2024-04-13 – 2024-04-17 (×5): 50 mg via ORAL
  Filled 2024-04-13 (×5): qty 1

## 2024-04-13 MED ORDER — ONDANSETRON 4 MG PO TBDP
4.0000 mg | ORAL_TABLET | Freq: Four times a day (QID) | ORAL | Status: DC | PRN
  Administered 2024-04-15 – 2024-04-18 (×7): 4 mg via ORAL
  Filled 2024-04-13 (×7): qty 1

## 2024-04-13 NOTE — H&P (Addendum)
 "    Ronald [name] [facility] [birth year]  [id number].    Requesting MD: Rogelia, MD Chief Complaint/Reason for Consult: appendicitis   HPI:  Ronald [name] is a 43 y/o M with a history of bipolar disorder and recent diagnosis of appendicitis who presents with abdominal pain, nausea, p.o. intolerance.  He initially presented to the emergency department on 03/29/2024 with abdominal pain where he was diagnosed with appendicitis.  He was admitted to the hospital for 5 days for nonoperative management of appendicitis with antibiotics and gradual diet advancement.  He was discharged home on 12/20.  He was scheduled for interval appendectomy early next month but re-presents today with persistent symptoms of appendicitis.  Reports vomiting after drinking water , denies constipation but reports loose nonbloody stools.  Reports chills at home.  States he has been unable to take his bipolar medications for 2 days.  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  ROS: Review of Systems  All other systems reviewed and are negative.   No family history on file.  Past Medical History:  Diagnosis Date   Anxiety    Arthritis    Disorder of pineal gland    Post traumatic stress disorder    Schizoaffective disorder, bipolar type (HCC) 08/29/2023   Severe manic bipolar 1 disorder with psychotic behavior (HCC) 08/29/2023    Past Surgical History:  Procedure Laterality Date   ANKLE SURGERY      Social History:  reports that he has never smoked. His smokeless tobacco use includes chew. He reports current drug use. Drug: Marijuana. He reports that he does not drink alcohol.  Allergies: Allergies[1]  (Not in a hospital admission)  Physical Exam: Blood pressure (!) 137/99, pulse 69, temperature 99.4 F (37.4 C), temperature source Oral, resp. rate 18, SpO2 98%. General: Pleasant white male laying on hospital bed, appears stated age, NAD. HEENT: head  -normocephalic, atraumatic; Eyes: PERRLA, no conjunctival injection Neck- Trachea is midline CV- RRR, normal S1/S2, no M/R/G, radial and dorsalis pedis pulses 2+ BL  Pulm- breathing is non-labored ORA Abd- soft, nondistended, tender to palpation in the right lower quadrant, palpation of the left lower quadrant elicits pain in the right lower quadrant, no hernias or masses. GU- deferred  MSK- UE/LE symmetrical, no cyanosis, clubbing, or edema. Neuro- CN II-XII grossly in tact, no paresthesias. Psych- Alert and Oriented x3 with appropriate affect Skin: warm and dry, no rashes or lesions   Results for orders placed or performed during the hospital encounter of 04/12/24 (from the past 48 hours)  Lipase, blood     Status: None   Collection Time: 04/12/24  8:54 PM  Result Value Ref Range   Lipase 19 11 - 51 U/L    Comment: Performed at [facility], [address] [place], [name] [address]  Comprehensive metabolic panel     Status: None   Collection Time: 04/12/24  8:54 PM  Result Value Ref Range   Sodium 138 135 - 145 mmol/L   Potassium 4.3 3.5 - 5.1 mmol/L   Chloride 100 98 - 111 mmol/L   CO2 29 22 - 32 mmol/L   Glucose, Bld 85 70 - 99 mg/dL    Comment: Glucose reference range applies only to samples taken after fasting for at least 8 hours.   BUN 9 6 - 20 mg/dL   Creatinine, Ser 9.12 0.61 - 1.24 mg/dL   Calcium  9.1 8.9 - 10.3 mg/dL  Total Protein 6.7 6.5 - 8.1 g/dL   Albumin 4.4 3.5 - 5.0 g/dL   AST 17 15 - 41 U/L   ALT 12 0 - 44 U/L   Alkaline Phosphatase 47 38 - 126 U/L   Total Bilirubin 0.3 0.0 - 1.2 mg/dL   GFR, Estimated >39 >39 mL/min    Comment: (NOTE) Calculated using the CKD-EPI Creatinine Equation (2021)    Anion gap 9 5 - 15    Comment: Performed at [facility], [address] [place], [name] [address]  CBC     Status: None   Collection Time: 04/12/24  8:54 PM  Result Value Ref Range   WBC 6.6 4.0 - 10.5 K/uL   RBC 4.60  4.22 - 5.81 MIL/uL   Hemoglobin 14.4 13.0 - 17.0 g/dL   HCT 59.0 60.9 - 47.9 %   MCV 88.9 80.0 - 100.0 fL   MCH 31.3 26.0 - 34.0 pg   MCHC 35.2 30.0 - 36.0 g/dL   RDW 87.6 88.4 - 84.4 %   Platelets 240 150 - 400 K/uL   nRBC 0.0 0.0 - 0.2 %    Comment: Performed at [facility], [address], [place], [name] [address]  CBC with Differential     Status: None   Collection Time: 04/13/24  8:44 AM  Result Value Ref Range   WBC 8.6 4.0 - 10.5 K/uL   RBC 4.97 4.22 - 5.81 MIL/uL   Hemoglobin 15.3 13.0 - 17.0 g/dL   HCT 55.8 60.9 - 47.9 %   MCV 88.7 80.0 - 100.0 fL   MCH 30.8 26.0 - 34.0 pg   MCHC 34.7 30.0 - 36.0 g/dL   RDW 87.5 88.4 - 84.4 %   Platelets 307 150 - 400 K/uL   nRBC 0.0 0.0 - 0.2 %   Neutrophils Relative % 75 %   Neutro Abs 6.4 1.7 - 7.7 K/uL   Lymphocytes Relative 18 %   Lymphs Abs 1.5 0.7 - 4.0 K/uL   Monocytes Relative 7 %   Monocytes Absolute 0.6 0.1 - 1.0 K/uL   Eosinophils Relative 0 %   Eosinophils Absolute 0.0 0.0 - 0.5 K/uL   Basophils Relative 0 %   Basophils Absolute 0.0 0.0 - 0.1 K/uL   Immature Granulocytes 0 %   Abs Immature Granulocytes 0.01 0.00 - 0.07 K/uL    Comment: Performed at [facility], [address], [place], [name] [address]   CT ABDOMEN PELVIS W CONTRAST Result Date: 04/13/2024 EXAM: CT ABDOMEN AND PELVIS WITH CONTRAST 04/13/2024 11:06:32 AM TECHNIQUE: CT of the abdomen and pelvis was performed with the administration of 100 mL of iohexol  (OMNIPAQUE ) 300 MG/ML solution. Multiplanar reformatted images are provided for review. Automated exposure control, iterative reconstruction, and/or weight-based adjustment of the mA/kV was utilized to reduce the radiation dose to as low as reasonably achievable. COMPARISON: CT abdomen and pelvis 04/06/2024, 03/29/24 CLINICAL HISTORY: RLQ abdominal pain worsening, known chronic appendicitis. Pt complains of worsening RLQ abdominal pain, nausea, and vomiting. Known  chronic appendicitis currently on antibiotic therapy. Following up with surgery. States his pain has worsened and he cannot keep anything down. FINDINGS: LOWER CHEST: No acute abnormality. LIVER: The liver is unremarkable. GALLBLADDER AND BILE DUCTS: Gallbladder is unremarkable. No biliary ductal dilatation. SPLEEN: No acute abnormality. PANCREAS: No acute abnormality. ADRENAL GLANDS: No acute abnormality. KIDNEYS, URETERS AND BLADDER: No stones in the kidneys or ureters. No hydronephrosis. No perinephric or periureteral stranding. Urinary  bladder is unremarkable. GI AND BOWEL: Stomach demonstrates no acute abnormality. There is no bowel obstruction. APPENDIX: The appendix is thickened in caliber, measuring up to 12 mm. No associated appendicolith. No associated periappendiceal fat stranding. No right lower quadrant inflammatory changes. PERITONEUM AND RETROPERITONEUM: No ascites. No free air. No organized fluid collection. VASCULATURE: Aorta is normal in caliber. LYMPH NODES: No lymphadenopathy. REPRODUCTIVE ORGANS: No acute abnormality. BONES AND SOFT TISSUES: Left L5-S1 pseudoarthrosis. No focal soft tissue abnormality. IMPRESSION: 1. Thickened appendix measuring up to 12 mm without appendicolith or periappendiceal inflammatory change. Finding may represent developing acute appendicitis. Electronically signed by: Morgane Naveau MD 04/13/2024 01:04 PM EST RP Workstation: [id number]      Assessment/Plan 43 year old male with smoldering acute appendicitis, without appendicolith, without evidence of perforation He has a low-grade fever, no leukocytosis, hemodynamically stable.  He ate a sandwich 30 minutes ago.  Recommend appendectomy for ongoing symptomatic appendicitis. Clear liquid diet, n.p.o. after midnight in anticipation of surgery tomorrow. IV Rocephin /Flagyl  Discussed plan for surgery with the patient as well as his mother.  Questions welcomed and answered.   FEN - CLD, NPO MN VTE - SCD's,  Lovenox  ID - Rocephin /Flagyl  Admit - CCS service, observation  I reviewed nursing notes, ED provider notes, last 24 h vitals and pain scores, last 48 h intake and output, last 24 h labs and trends, and last 24 h imaging results.  Ronald GORMAN Pringle, PA-C [facility] 04/13/2024, 3:11 PM Please see Amion for pager number during day hours 7:00am-4:30pm or 7:00am -11:30am on weekends     [1]  Allergies Allergen Reactions   Vistaril  [Hydroxyzine ] Itching, Anxiety and Other (See Comments)    Hyperactivity    Alupent [Metaproterenol] Other (See Comments)    Hyperactivity   Seroquel  [Quetiapine ] Other (See Comments)    Hallucinations    Zyprexa  [Olanzapine ] Other (See Comments)    made patient feel weird   Antihistamines, Chlorpheniramine-Type Anxiety and Other (See Comments)    made patient feel insane - all antihistamines   Antihistamines, Diphenhydramine -Type Anxiety and Other (See Comments)    made patient feel insane - all antihistamines   Antihistamines, Loratadine-Type Anxiety and Other (See Comments)    made patient feel insane - all antihistamines   Clonidine And Derivatives Anxiety and Other (See Comments)    Caused a red/flushed face and had a panic attack   "

## 2024-04-13 NOTE — Telephone Encounter (Signed)
 Negative.  I do not accept this transfer of care.  Thanks.

## 2024-04-13 NOTE — ED Notes (Signed)
 Patient complaining of his abdominal pain worsening. [place], [place] called to bedside to reassess in triage.

## 2024-04-13 NOTE — ED Notes (Signed)
 Patient states he has to vomit again. Triage nurse is aware.

## 2024-04-13 NOTE — ED Notes (Signed)
 Patient given a cola after speaking with RN

## 2024-04-13 NOTE — ED Provider Notes (Signed)
 " [place] EMERGENCY DEPARTMENT AT [facility] Provider Note   CSN: [id number] Arrival date & time: 04/12/24  1947     History Chief Complaint  Patient presents with   Abdominal Pain    HPI: Ronald [name] is a 43 y.o. male with history pertinent chronic appendicitis who presents complaining of abdominal pain. Patient arrived via POV.  History provided by patient.  No interpreter required during this encounter.  Patient reports that he has a history of chronic appendicitis, reports that it was initially diagnosed in mid December, was initially admitted, on IV antibiotics, reports that he has been taking p.o. antibiotics outpatient, and is scheduled for an appendectomy on 04/20/2024.  However reports that over the past 2 days he has had worsening of the pain, as well as nausea and vomiting.  Reports that he has not tolerated p.o. at home, reports that he was seen in triage and was given his home medications, and he was able to tolerate p.o. at that time, however still has ongoing severe pain and nausea that is increased from baseline.  Nurses fever, chills denies chest pain, shortness of breath, diarrhea.  Patient's recorded medical, surgical, social, medication list and allergies were reviewed in the Snapshot window as part of the initial history.   Prior to Admission medications  Medication Sig Start Date End Date Taking? Authorizing Provider  acetaminophen  (TYLENOL ) 500 MG tablet Take 2 tablets (1,000 mg total) by mouth every 6 (six) hours. Patient taking differently: Take 1,000 mg by mouth every 6 (six) hours as needed for mild pain (pain score 1-3). 04/08/24  Yes Cheryle Page, MD  amoxicillin -clavulanate (AUGMENTIN ) 875-125 MG tablet Take 1 tablet by mouth 2 (two) times daily for 15 days. 04/08/24 04/23/24 Yes Cheryle Page, MD  Ascorbic Acid (VITAMIN C) 1000 MG tablet Take 1,000 mg by mouth daily with breakfast.   Yes [provider]  divalproex  (DEPAKOTE )  500 MG DR tablet Take 2 tablets (1,000 mg total) by mouth 2 (two) times daily. Patient taking differently: Take 1,000 mg by mouth See admin instructions. Take 1,000 mg by mouth at 8 AM and 5 PM 03/04/24  Yes Joshua Debby CROME, MD  gabapentin  (NEURONTIN ) 100 MG capsule Take 2 capsules (200 mg total) by mouth 3 (three) times daily. Patient taking differently: Take 200 mg by mouth See admin instructions. Take 200 mg by mouth in the morning, at midday, and 5 PM 03/17/24  Yes Joshua Debby CROME, MD  ibuprofen  (ADVIL ) 200 MG tablet Take 800 mg by mouth daily as needed (for pain or headaches).   Yes [provider]  methocarbamol  (ROBAXIN ) 500 MG tablet Take 1 tablet (500 mg total) by mouth every 8 (eight) hours as needed for muscle spasms. 04/08/24  Yes Cheryle Page, MD  Multiple Vitamins-Minerals (MENS MULTIVITAMIN) TABS Take 1 tablet by mouth every 7 (seven) days.   Yes [provider]  oxyCODONE  (OXY IR/ROXICODONE ) 5 MG immediate release tablet Take 1-2 tablets (5-10 mg total) by mouth every 4 (four) hours as needed for severe pain (pain score 7-10) or moderate pain (pain score 4-6). 04/08/24  Yes Cheryle Page, MD  polycarbophil (FIBERCON) 625 MG tablet Take 1 tablet (625 mg total) by mouth 2 (two) times daily. 04/08/24  Yes Cheryle Page, MD  polyethylene glycol (MIRALAX ) 17 g packet Take 17 g by mouth daily as needed for mild constipation. 04/08/24  Yes Cheryle Page, MD  traZODone  (DESYREL ) 50 MG tablet Take 1 tablet (50 mg total) by  mouth at bedtime. 03/04/24  Yes Joshua Debby CROME, MD  VITAMIN D  PO Take 10,000 Units by mouth daily with breakfast.   Yes [provider]  ondansetron  (ZOFRAN ) 4 MG tablet Take 1 tablet (4 mg total) by mouth daily as needed for nausea or vomiting. Patient not taking: Reported on 04/13/2024 04/03/24 04/03/25  Barbarann Nest, MD     Allergies: Vistaril  [hydroxyzine ]; Alupent [metaproterenol]; Seroquel  [quetiapine ]; Zyprexa  [olanzapine ];  Antihistamines, chlorpheniramine-type; Antihistamines, diphenhydramine -type; Antihistamines, loratadine-type; and Clonidine and derivatives   Review of Systems   ROS as per HPI  Physical Exam Updated Vital Signs BP (!) 137/99   Pulse 69   Temp 99.4 F (37.4 C) (Oral)   Resp 18   SpO2 98%  Physical Exam Vitals and nursing note reviewed.  Constitutional:      General: He is not in acute distress.    Appearance: He is well-developed.  HENT:     Head: Normocephalic and atraumatic.  Eyes:     Conjunctiva/sclera: Conjunctivae normal.  Cardiovascular:     Rate and Rhythm: Normal rate and regular rhythm.     Heart sounds: No murmur heard. Pulmonary:     Effort: Pulmonary effort is normal. No respiratory distress.     Breath sounds: Normal breath sounds.  Abdominal:     Palpations: Abdomen is soft.     Tenderness: There is abdominal tenderness in the right lower quadrant. There is guarding. There is no rebound. Positive signs include McBurney's sign. Negative signs include Rovsing's sign.  Musculoskeletal:        General: No swelling.     Cervical back: Neck supple.  Skin:    General: Skin is warm and dry.     Capillary Refill: Capillary refill takes less than 2 seconds.  Neurological:     Mental Status: He is alert.  Psychiatric:        Mood and Affect: Mood normal.     ED Course/ Medical Decision Making/ A&P Clinical Course as of 04/13/24 1703  Tue Apr 13, 2024  0844 CT and talk with surgery [CH]    Clinical Course User Index [CH] Hinnant, Collin F, PA-C    Procedures Procedures   Medications Ordered in ED Medications  enoxaparin  (LOVENOX ) injection 40 mg (has no administration in time range)  lactated ringers  infusion ( Intravenous New Bag/Given 04/13/24 1649)  cefTRIAXone  (ROCEPHIN ) 2 g in sodium chloride  0.9 % 100 mL IVPB (2 g Intravenous New Bag/Given 04/13/24 1653)    And  metroNIDAZOLE  (FLAGYL ) IVPB 500 mg (500 mg Intravenous New Bag/Given 04/13/24 1650)   acetaminophen  (TYLENOL ) tablet 1,000 mg (has no administration in time range)  oxyCODONE  (Oxy IR/ROXICODONE ) immediate release tablet 5-10 mg (has no administration in time range)  HYDROmorphone  (DILAUDID ) injection 0.5-1 mg (has no administration in time range)  docusate sodium  (COLACE) capsule 100 mg (has no administration in time range)  ondansetron  (ZOFRAN -ODT) disintegrating tablet 4 mg (has no administration in time range)    Or  ondansetron  (ZOFRAN ) injection 4 mg (has no administration in time range)  divalproex  (DEPAKOTE ) DR tablet 1,000 mg (has no administration in time range)  gabapentin  (NEURONTIN ) capsule 200 mg (has no administration in time range)  traZODone  (DESYREL ) tablet 50 mg (has no administration in time range)  ondansetron  (ZOFRAN ) injection 4 mg (4 mg Intravenous Given 04/13/24 0149)  divalproex  (DEPAKOTE ) DR tablet 1,000 mg (1,000 mg Oral Given 04/13/24 0846)  gabapentin  (NEURONTIN ) capsule 200 mg (200 mg Oral Given 04/13/24 0846)  amoxicillin -clavulanate (AUGMENTIN ) 875-125  MG per tablet 1 tablet (1 tablet Oral Given 04/13/24 0846)  oxyCODONE  (Oxy IR/ROXICODONE ) immediate release tablet 5 mg (5 mg Oral Given 04/13/24 0846)  ondansetron  (ZOFRAN -ODT) disintegrating tablet 4 mg (4 mg Oral Given 04/13/24 0846)  iohexol  (OMNIPAQUE ) 300 MG/ML solution 100 mL (100 mLs Intravenous Contrast Given 04/13/24 0934)  lactated ringers  bolus 1,000 mL (0 mLs Intravenous Stopped 04/13/24 1400)  morphine  (PF) 4 MG/ML injection 4 mg (4 mg Intravenous Given 04/13/24 1244)  ondansetron  (ZOFRAN ) injection 4 mg (4 mg Intravenous Given 04/13/24 1250)    Medical Decision Making:   Kano Heckmann is a 43 y.o. male who presents for right lower quadrant pain as per above.  Physical exam is pertinent for right lower quadrant pain and tenderness without rebound..   The differential includes but is not limited to sepsis, worsening of chronic appendicitis, continuation of chronic  appendicitis.  Independent historian: None  External data reviewed: Notes: Reviewed prior notes from patient's prior admissions for appendicitis as well as prior CT from 12/23 which overall has similar appearance of right lower quadrant  Initial Plan:  Screening labs including CBC and Metabolic panel to evaluate for infectious or metabolic etiology of disease.  Screening lipase for pancreatitis in the setting of abdominal pain Blood cultures for possible sepsis given chronic appendicitis CT to evaluate for progression of chronic appendicitis and or other structural/infectious intra-abdominal pathology. Objective evaluation as below reviewed   Labs: Ordered, Independent interpretation, and Details: CBC without leukocytosis, anemia, thrombocytopenia.  CMP without AKI, emergent electrolyte derangement, emergent LFT abnormality  Radiology: Ordered, Independent interpretation, Details: Personally reviewed CT of the abdomen pelvis, do not appreciate free air, significant free fluid, obstructive bowel gas pattern.  I do appreciate dilated blind-ending structure concerning for appendicitis., and All images reviewed independently.  Agree with radiology report at this time.   CT ABDOMEN PELVIS W CONTRAST Result Date: 04/13/2024 EXAM: CT ABDOMEN AND PELVIS WITH CONTRAST 04/13/2024 11:06:32 AM TECHNIQUE: CT of the abdomen and pelvis was performed with the administration of 100 mL of iohexol  (OMNIPAQUE ) 300 MG/ML solution. Multiplanar reformatted images are provided for review. Automated exposure control, iterative reconstruction, and/or weight-based adjustment of the mA/kV was utilized to reduce the radiation dose to as low as reasonably achievable. COMPARISON: CT abdomen and pelvis 04/06/2024, 03/29/24 CLINICAL HISTORY: RLQ abdominal pain worsening, known chronic appendicitis. Pt complains of worsening RLQ abdominal pain, nausea, and vomiting. Known chronic appendicitis currently on antibiotic therapy.  Following up with surgery. States his pain has worsened and he cannot keep anything down. FINDINGS: LOWER CHEST: No acute abnormality. LIVER: The liver is unremarkable. GALLBLADDER AND BILE DUCTS: Gallbladder is unremarkable. No biliary ductal dilatation. SPLEEN: No acute abnormality. PANCREAS: No acute abnormality. ADRENAL GLANDS: No acute abnormality. KIDNEYS, URETERS AND BLADDER: No stones in the kidneys or ureters. No hydronephrosis. No perinephric or periureteral stranding. Urinary bladder is unremarkable. GI AND BOWEL: Stomach demonstrates no acute abnormality. There is no bowel obstruction. APPENDIX: The appendix is thickened in caliber, measuring up to 12 mm. No associated appendicolith. No associated periappendiceal fat stranding. No right lower quadrant inflammatory changes. PERITONEUM AND RETROPERITONEUM: No ascites. No free air. No organized fluid collection. VASCULATURE: Aorta is normal in caliber. LYMPH NODES: No lymphadenopathy. REPRODUCTIVE ORGANS: No acute abnormality. BONES AND SOFT TISSUES: Left L5-S1 pseudoarthrosis. No focal soft tissue abnormality. IMPRESSION: 1. Thickened appendix measuring up to 12 mm without appendicolith or periappendiceal inflammatory change. Finding may represent developing acute appendicitis. Electronically signed by: Morgane Naveau MD 04/13/2024 01:04  PM EST RP Workstation: [id number]   CT ABDOMEN PELVIS W CONTRAST Result Date: 04/06/2024 EXAM: CT ABDOMEN AND PELVIS WITH CONTRAST 04/06/2024 12:30:00 PM TECHNIQUE: CT of the abdomen and pelvis was performed with the administration of 100 mL of iohexol  (OMNIPAQUE ) 300 MG/ML solution. Multiplanar reformatted images are provided for review. Automated exposure control, iterative reconstruction, and/or weight-based adjustment of the mA/kV was utilized to reduce the radiation dose to as low as reasonably achievable. COMPARISON: CTs with contrast dated 04/02/2024 and 03/29/2024, and earlier CTA chest, abdomen, and pelvis  02/09/2024. CLINICAL HISTORY: RLQ abdominal pain onset 2 weeks ago, with findings consistent with appendicitis on two earlier studies from this month. FINDINGS: LOWER CHEST: No acute abnormality. LIVER: The liver is unremarkable. GALLBLADDER AND BILE DUCTS: Gallbladder is unremarkable. No biliary ductal dilatation. SPLEEN: No acute abnormality. PANCREAS: No acute abnormality. ADRENAL GLANDS: No adrenal mass. 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 There is no renal mass. No stones in the kidneys or ureters. No hydronephrosis. No perinephric or periureteral stranding. Urinary bladder is unremarkable. GI AND BOWEL: Stomach demonstrates no acute abnormality. There is no small bowel obstruction or inflammation. The appendix is swollen and inflamed again measuring 1.3 cm although with a less pronounced inflammatory reaction than on both prior studies. Findings do remain consistent with acute appendicitis. There is moderate fecal stasis with unremarkable large bowel wall. There is no evidence of appendiceal rupture or abscess. Some thickening noted in the adjacent terminal ileal segment, but this is probably reactive. PERITONEUM AND RETROPERITONEUM: No free fluid, free hemorrhage, free air, or incarcerated hernia. VASCULATURE: The abdominal aorta is normal. 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 LYMPH NODES: No lymphadenopathy. REPRODUCTIVE ORGANS: No acute abnormality. BONES AND SOFT TISSUES: Mild levocoliosis and degenerative change of the lumbar spine with transitional anatomy lumbosacral junction. No acute or other significant osseous findings. No focal soft tissue abnormality. IMPRESSION: 1. Acute appendicitis, with less pronounced inflammatory reaction than on prior studies. No evidence of appendiceal rupture or abscess. 2. 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 Electronically signed by: Francis Quam MD 04/06/2024  11:03 PM EST RP Workstation: [id number]   DG ABD ACUTE 2+V W 1V CHEST Result Date: 04/05/2024 CLINICAL DATA:  Evaluate for free air, history of right lower quadrant pain EXAM: DG ABDOMEN ACUTE WITH 1 VIEW CHEST COMPARISON:  CT from 04/02/2024 FINDINGS: Cardiac shadow is within normal limits. The lungs are well aerated bilaterally. No focal infiltrate or effusion is seen. Scattered large and small bowel gas is noted. No obstructive changes are seen. No mass effect is noted. No free air is noted. No bony abnormality is seen. IMPRESSION: No acute abnormality in the chest and abdomen. Specifically no findings to suggest free air are noted. Electronically Signed   By: Oneil Devonshire M.D.   On: 04/05/2024 00:39   CT ABDOMEN PELVIS W CONTRAST Result Date: 04/02/2024 CLINICAL DATA:  Right lower quadrant pain for several days, history of abnormal appendix on prior exam. EXAM: CT ABDOMEN AND PELVIS WITH CONTRAST TECHNIQUE: Multidetector CT imaging of the abdomen and pelvis was performed using the standard protocol following bolus administration of intravenous contrast. RADIATION DOSE REDUCTION: This exam was performed according to the departmental dose-optimization program which includes automated exposure control, adjustment of the mA and/or kV according to patient size and/or use of iterative reconstruction technique. CONTRAST:  OMNIPAQUE  IOHEXOL  300 MG/ML  SOLN COMPARISON:  03/29/2024.  FINDINGS: Lower chest: No acute abnormality. Hepatobiliary: No focal liver abnormality is seen. No gallstones, gallbladder wall thickening, or biliary dilatation. Pancreas: Unremarkable. No pancreatic ductal dilatation or surrounding inflammatory changes. Spleen: Normal in size without focal abnormality. Adrenals/Urinary Tract: Adrenal glands are unremarkable. Kidneys are normal, without renal calculi, focal lesion, or hydronephrosis. Bladder is unremarkable. Stomach/Bowel: No obstructive or inflammatory changes of the colon are  noted. There remains dilatation of the appendix with surrounding inflammatory change. Some slight decrease in inflammatory change is noted no definitive signs of perforation or abscess formation are noted at this time. Small bowel and stomach are within normal limits. Vascular/Lymphatic: No significant vascular findings are present. No enlarged abdominal or pelvic lymph nodes. Reproductive: Prostate is unremarkable. Other: No abdominal wall hernia or abnormality. No abdominopelvic ascites. Musculoskeletal: No acute or significant osseous findings. IMPRESSION: Changes consistent with acute appendicitis with very minimal decrease in inflammatory changes. No findings to suggest perforation or abscess formation are noted at this time. Electronically Signed   By: Oneil Devonshire M.D.   On: 04/02/2024 20:25   CT ABDOMEN PELVIS W CONTRAST Result Date: 03/29/2024 EXAM: CT ABDOMEN AND PELVIS WITH CONTRAST 03/29/2024 05:48:36 PM TECHNIQUE: CT of the abdomen and pelvis was performed with the administration of 100 mL of iohexol  (OMNIPAQUE ) 300 MG/ML solution. Multiplanar reformatted images are provided for review. Automated exposure control, iterative reconstruction, and/or weight-based adjustment of the mA/kV was utilized to reduce the radiation dose to as low as reasonably achievable. COMPARISON: 02/09/2024. CLINICAL HISTORY: RLQ abdominal pain; possible appendicitis. FINDINGS: LOWER CHEST: Bilateral lower lobe atelectasis. LIVER: The liver is unremarkable. GALLBLADDER AND BILE DUCTS: Gallbladder is unremarkable. No biliary ductal dilatation. SPLEEN: No acute abnormality. PANCREAS: No acute abnormality. ADRENAL GLANDS: No acute abnormality. KIDNEYS, URETERS AND BLADDER: No stones in the kidneys or ureters. No hydronephrosis. No perinephric or periureteral stranding. The bladder is mildly thick-walled but underdistended. GI AND BOWEL: Stomach demonstrates no acute abnormality. Abnormal appendix, measuring up to 16 mm with  periappendiceal stranding (image 95), reflecting acute appendicitis. Suspected mild secondary inflammatory changes involving the adjacent terminal ileum. There is no bowel obstruction. PERITONEUM AND RETROPERITONEUM: No drainable fluid collection/abscess. No free air. VASCULATURE: Aorta is normal in caliber. LYMPH NODES: No lymphadenopathy. REPRODUCTIVE ORGANS: The prostate is unremarkable. BONES AND SOFT TISSUES: Mild degenerative changes of the lumbar spine. No acute osseous abnormality. No focal soft tissue abnormality. IMPRESSION: 1. Acute appendicitis. No drainable fluid collection or free air. 2. Suspected mild secondary inflammatory changes involving the adjacent terminal ileum. Electronically signed by: Pinkie Pebbles MD 03/29/2024 06:34 PM EST RP Workstation: [id number]    EKG/Medicine tests: Not indicated EKG Interpretation:                  Interventions: Zofran , oxycodone , gabapentin , Depakote , Augmentin , morphine , LR bolus  See the EMR for full details regarding lab and imaging results.  Presents for worsening of right lower quadrant pain in the setting of chronic appendicitis.  Patient has not tolerated p.o. at home due to worsening pain, was given Zofran  in triage, and thereafter was able to tolerate p.o. medications, which patient states is the first time he tolerated p.o. in 2 days.  Patient underwent labs and imaging, labs are reassuring, patient without significant leukocytosis which makes progression of appendicitis less likely, though patient notably was febrile while in the ED.  CT demonstrates similar dilation to most recent prior CT, consistent with redemonstration of chronic appendicitis.  Given patient reports that he had worsening pain, do  feel that patient warrants surgery consult.  Consulted general surgery, spoke with Simaan, PA-C on-call, who evaluated the patient and does feel that patient is appropriate for admission to their service for appendectomy given progression  of pain and p.o. intolerance at home.  Unfortunately patient was fed while in the emergency department, which I did communicate to surgery.  Patient admitted to surgery service.  Presentation is most consistent with acute complicated illness and Presentation is most consistent with exacerbation of chronic illness  Discussion of management or test interpretations with external provider(s): Surgery, Simaan, PA-C   Risk Drugs:OTC drugs, Prescription drug management, and Parenteral controlled substances Treatment: Decision regarding hospitalization  Disposition: ADMIT: I believe the patient requires admission for further care and management. The patient was admitted to surgery. Please see inpatient provider note for additional treatment plan details.   MDM generated using voice dictation software and may contain dictation errors.  Please contact me for any clarification or with any questions.  Clinical Impression:  1. Other appendicitis      Admit   Final Clinical Impression(s) / ED Diagnoses Final diagnoses:  Other appendicitis    Rx / DC Orders ED Discharge Orders     None        Rogelia Jerilynn RAMAN, MD 04/13/24 1706  "

## 2024-04-13 NOTE — ED Provider Triage Note (Signed)
 Emergency Medicine Provider Triage Evaluation Note  Ronald [name] , a 43 y.o. male  was evaluated in triage.  Pt complains of worsening RLQ abdominal pain, nausea, and vomiting. Known chronic appendicitis currently on antibiotic therapy. Following up with surgery. States his pain has worsened and he cannot keep anything down.  Review of Systems  Positive: Nausea, vomiting, RLQ abdominal pain Negative: Chest pain, shortness of breath  Physical Exam  BP (!) 146/95 (BP Location: Left Arm)   Pulse (!) 59   Temp 99 F (37.2 C) (Oral)   Resp 20   SpO2 99%  Gen:   Awake, no distress   Resp:  Normal effort, patient talking in full sentences on room air MSK:   Moves extremities without difficulty  Other:  RLQ abdominal tenderness, abdomen is soft however severe pain with palpation  Medical Decision Making  Medically screening exam initiated at 8:52 AM.  Appropriate orders placed.  Lamar Carlin Michaels was informed that the remainder of the evaluation will be completed by another provider, this initial triage assessment does not replace that evaluation, and the importance of remaining in the ED until their evaluation is complete.  Orders CBC, CMP, lipase, blood cultures, UA, CT abdomen pelvis with contrast, oral meds   Phelan Schadt F, PA-C 04/13/24 [phone]

## 2024-04-13 NOTE — ED Notes (Signed)
 Patient states he needs pain meds right now. He is about to have surgery on the 6th. Triage nurse is aware

## 2024-04-13 NOTE — ED Notes (Signed)
 Per CT patient stating he wants a new IV, patient IV site clean/dry/intact/flushing/blood return.

## 2024-04-13 NOTE — Telephone Encounter (Signed)
 Please advise

## 2024-04-13 NOTE — Telephone Encounter (Signed)
 Copied from CRM #[id number]. Topic: Appointments - Scheduling Inquiry for Clinic >> Apr 13, 2024 10:48 AM Thersia BROCKS wrote: Reason for CRM: Patient mom called in wanted to know if there is anyway he can be seen by Dr.Sagardia as his primary , patient mom has been a patient of Dr.Sagardia and he would like to be seen by him to . Would like a callback if able to schedule with him

## 2024-04-14 ENCOUNTER — Encounter (HOSPITAL_COMMUNITY): Admission: EM | Disposition: A | Payer: Self-pay | Source: Home / Self Care | Attending: Emergency Medicine

## 2024-04-14 ENCOUNTER — Observation Stay (HOSPITAL_COMMUNITY): Payer: Self-pay | Admitting: Anesthesiology

## 2024-04-14 ENCOUNTER — Encounter (HOSPITAL_COMMUNITY): Payer: Self-pay

## 2024-04-14 ENCOUNTER — Telehealth: Payer: Self-pay

## 2024-04-14 DIAGNOSIS — K37 Unspecified appendicitis: Secondary | ICD-10-CM

## 2024-04-14 HISTORY — PX: LAPAROSCOPIC APPENDECTOMY: SHX408

## 2024-04-14 LAB — BLOOD CULTURE ID PANEL (REFLEXED) - BCID2

## 2024-04-14 LAB — BASIC METABOLIC PANEL WITH GFR
Anion gap: 9 (ref 5–15)
BUN: <5 mg/dL — ABNORMAL LOW (ref 6–20)
CO2: 29 mmol/L (ref 22–32)
Calcium: 9.6 mg/dL (ref 8.9–10.3)
Chloride: 101 mmol/L (ref 98–111)
Creatinine, Ser: 1.03 mg/dL (ref 0.61–1.24)
GFR, Estimated: >60 mL/min
Glucose, Bld: 78 mg/dL (ref 70–99)
Potassium: 4.5 mmol/L (ref 3.5–5.1)
Sodium: 140 mmol/L (ref 135–145)

## 2024-04-14 LAB — CBC
HCT: 40.5 % (ref 39.0–52.0)
Hemoglobin: 13.7 g/dL (ref 13.0–17.0)
MCH: 30.8 pg (ref 26.0–34.0)
MCHC: 33.8 g/dL (ref 30.0–36.0)
MCV: 91 fL (ref 80.0–100.0)
Platelets: 199 K/uL (ref 150–400)
RBC: 4.45 MIL/uL (ref 4.22–5.81)
RDW: 12.8 % (ref 11.5–15.5)
WBC: 4.7 K/uL (ref 4.0–10.5)
nRBC: 0 % (ref 0.0–0.2)

## 2024-04-14 LAB — SURGICAL PCR SCREEN
MRSA, PCR: NEGATIVE
Staphylococcus aureus: NEGATIVE

## 2024-04-14 SURGERY — APPENDECTOMY, LAPAROSCOPIC
Anesthesia: General | Site: Abdomen

## 2024-04-14 MED ORDER — FENTANYL CITRATE (PF) 100 MCG/2ML IJ SOLN
INTRAMUSCULAR | Status: DC | PRN
  Administered 2024-04-14 (×2): 100 ug via INTRAVENOUS

## 2024-04-14 MED ORDER — MIDAZOLAM HCL 5 MG/5ML IJ SOLN
INTRAMUSCULAR | Status: DC | PRN
  Administered 2024-04-14: 2 mg via INTRAVENOUS

## 2024-04-14 MED ORDER — CHLORHEXIDINE GLUCONATE CLOTH 2 % EX PADS: 6.0000 | MEDICATED_PAD | Freq: Once | CUTANEOUS | Status: DC

## 2024-04-14 MED ORDER — LACTATED RINGERS IR SOLN
Status: DC | PRN
  Administered 2024-04-14: 1000 mL

## 2024-04-14 MED ORDER — LIDOCAINE HCL (CARDIAC) PF 100 MG/5ML IV SOSY
PREFILLED_SYRINGE | INTRAVENOUS | Status: DC | PRN
  Administered 2024-04-14: 100 mg via INTRAVENOUS

## 2024-04-14 MED ORDER — DROPERIDOL 2.5 MG/ML IJ SOLN: 0.6250 mg | Freq: Once | INTRAMUSCULAR | Status: DC | PRN

## 2024-04-14 MED ORDER — ORAL CARE MOUTH RINSE: 15.0000 mL | Freq: Once | OROMUCOSAL | Status: AC

## 2024-04-14 MED ORDER — SUGAMMADEX SODIUM 200 MG/2ML IV SOLN
INTRAVENOUS | Status: DC | PRN
  Administered 2024-04-14: 200 mg via INTRAVENOUS

## 2024-04-14 MED ORDER — ACETAMINOPHEN 500 MG PO TABS
1000.0000 mg | ORAL_TABLET | ORAL | Status: AC
  Administered 2024-04-14: 1000 mg via ORAL

## 2024-04-14 MED ORDER — ONDANSETRON HCL 4 MG/2ML IJ SOLN
INTRAMUSCULAR | Status: DC | PRN
  Administered 2024-04-14: 4 mg via INTRAVENOUS

## 2024-04-14 MED ORDER — 0.9 % SODIUM CHLORIDE (POUR BTL) OPTIME
TOPICAL | Status: DC | PRN
  Administered 2024-04-14: 1000 mL

## 2024-04-14 MED ORDER — ROCURONIUM BROMIDE 100 MG/10ML IV SOLN
INTRAVENOUS | Status: DC | PRN
  Administered 2024-04-14: 50 mg via INTRAVENOUS
  Administered 2024-04-14: 20 mg via INTRAVENOUS

## 2024-04-14 MED ORDER — OXYCODONE HCL 5 MG/5ML PO SOLN: 5.0000 mg | Freq: Once | ORAL | Status: DC | PRN

## 2024-04-14 MED ORDER — STERILE WATER FOR IRRIGATION IR SOLN
Status: DC | PRN
  Administered 2024-04-14: 1000 mL

## 2024-04-14 MED ORDER — SUGAMMADEX SODIUM 200 MG/2ML IV SOLN
INTRAVENOUS | Status: AC
  Filled 2024-04-14: qty 2

## 2024-04-14 MED ORDER — CEFAZOLIN SODIUM-DEXTROSE 2-4 GM/100ML-% IV SOLN
2.0000 g | INTRAVENOUS | Status: AC
  Administered 2024-04-14: 2 g via INTRAVENOUS

## 2024-04-14 MED ORDER — BUPIVACAINE-EPINEPHRINE 0.25% -1:200000 IJ SOLN
INTRAMUSCULAR | Status: DC | PRN
  Administered 2024-04-14: 7 mL

## 2024-04-14 MED ORDER — FENTANYL CITRATE (PF) 250 MCG/5ML IJ SOLN
INTRAMUSCULAR | Status: AC
  Filled 2024-04-14: qty 5

## 2024-04-14 MED ORDER — PROPOFOL 10 MG/ML IV BOLUS
INTRAVENOUS | Status: DC | PRN
  Administered 2024-04-14: 200 mg via INTRAVENOUS

## 2024-04-14 MED ORDER — GABAPENTIN 100 MG PO CAPS: 100.0000 mg | ORAL_CAPSULE | ORAL | Status: AC

## 2024-04-14 MED ORDER — MIDAZOLAM HCL 2 MG/2ML IJ SOLN
INTRAMUSCULAR | Status: AC
  Filled 2024-04-14: qty 2

## 2024-04-14 MED ORDER — OXYCODONE HCL 5 MG PO TABS: 5.0000 mg | ORAL_TABLET | Freq: Once | ORAL | Status: DC | PRN

## 2024-04-14 MED ORDER — LACTATED RINGERS IV SOLN: INTRAVENOUS | Status: DC

## 2024-04-14 MED ORDER — PROPOFOL 10 MG/ML IV BOLUS
INTRAVENOUS | Status: AC
  Filled 2024-04-14: qty 20

## 2024-04-14 MED ORDER — CHLORHEXIDINE GLUCONATE 0.12 % MT SOLN
15.0000 mL | Freq: Once | OROMUCOSAL | Status: AC
  Administered 2024-04-14: 15 mL via OROMUCOSAL

## 2024-04-14 MED ORDER — ACETAMINOPHEN 10 MG/ML IV SOLN: 1000.0000 mg | Freq: Once | INTRAVENOUS | Status: DC | PRN

## 2024-04-14 MED ORDER — LACTATED RINGERS IV SOLN: INTRAVENOUS | Status: DC | PRN

## 2024-04-14 MED ORDER — ONDANSETRON HCL 4 MG/2ML IJ SOLN
INTRAMUSCULAR | Status: AC
  Filled 2024-04-14: qty 2

## 2024-04-14 MED ORDER — DEXAMETHASONE SODIUM PHOSPHATE 4 MG/ML IJ SOLN
INTRAMUSCULAR | Status: DC | PRN
  Administered 2024-04-14: 5 mg via INTRAVENOUS

## 2024-04-14 MED ORDER — FENTANYL CITRATE (PF) 50 MCG/ML IJ SOSY
25.0000 ug | PREFILLED_SYRINGE | INTRAMUSCULAR | Status: DC | PRN
  Administered 2024-04-14: 50 ug via INTRAVENOUS

## 2024-04-14 MED ORDER — FENTANYL CITRATE (PF) 50 MCG/ML IJ SOSY
PREFILLED_SYRINGE | INTRAMUSCULAR | Status: AC
  Filled 2024-04-14: qty 1

## 2024-04-14 SURGICAL SUPPLY — 32 items
BAG COUNTER SPONGE SURGICOUNT (BAG) IMPLANT
CHLORAPREP W/TINT 26 (MISCELLANEOUS) ×1 IMPLANT
CNTNR URN SCR LID CUP LEK RST (MISCELLANEOUS) IMPLANT
COVER SURGICAL LIGHT HANDLE (MISCELLANEOUS) ×1 IMPLANT
CUTTER ECHEON FLEX ENDO 45 340 (ENDOMECHANICALS) ×1 IMPLANT
DERMABOND ADVANCED .7 DNX12 (GAUZE/BANDAGES/DRESSINGS) ×1 IMPLANT
ELECT REM PT RETURN 15FT ADLT (MISCELLANEOUS) ×1 IMPLANT
GAUZE 4X4 16PLY ~~LOC~~+RFID DBL (SPONGE) IMPLANT
GLOVE BIO SURGEON STRL SZ7 (GLOVE) ×1 IMPLANT
GLOVE BIOGEL PI IND STRL 7.5 (GLOVE) ×1 IMPLANT
GOWN STRL REUS W/ TWL XL LVL3 (GOWN DISPOSABLE) ×1 IMPLANT
GRASPER SUT TROCAR 14GX15 (MISCELLANEOUS) ×1 IMPLANT
IRRIGATION SUCT STRKRFLW 2 WTP (MISCELLANEOUS) IMPLANT
KIT BASIN OR (CUSTOM PROCEDURE TRAY) ×1 IMPLANT
KIT TURNOVER KIT A (KITS) ×1 IMPLANT
NS IRRIG 1000ML POUR BTL (IV SOLUTION) ×1 IMPLANT
POUCH RETRIEVAL ECOSAC 10 (ENDOMECHANICALS) ×1 IMPLANT
RELOAD STAPLE 45 2.6 WHT THIN (STAPLE) IMPLANT
RELOAD STAPLE 45 3.6 BLU REG (STAPLE) IMPLANT
SCISSORS LAP 5X35 DISP (ENDOMECHANICALS) IMPLANT
SET TUBE SMOKE EVAC HIGH FLOW (TUBING) ×1 IMPLANT
SHEARS HARMONIC 36 ACE (MISCELLANEOUS) ×1 IMPLANT
SLEEVE Z-THREAD 5X100MM (TROCAR) ×1 IMPLANT
SPIKE FLUID TRANSFER (MISCELLANEOUS) ×1 IMPLANT
STRIP CLOSURE SKIN 1/2X4 (GAUZE/BANDAGES/DRESSINGS) ×1 IMPLANT
SUT MNCRL AB 4-0 PS2 18 (SUTURE) ×1 IMPLANT
SUT VICRYL 0 UR6 27IN ABS (SUTURE) ×2 IMPLANT
TOWEL OR DSP ST BLU DLX 10/PK (DISPOSABLE) ×1 IMPLANT
TRAY FOLEY MTR SLVR 16FR STAT (SET/KITS/TRAYS/PACK) IMPLANT
TRAY LAPAROSCOPIC (CUSTOM PROCEDURE TRAY) ×1 IMPLANT
TROCAR BALLN 12MMX100 BLUNT (TROCAR) ×1 IMPLANT
TROCAR Z-THREAD OPTICAL 5X100M (TROCAR) ×1 IMPLANT

## 2024-04-14 NOTE — Interval H&P Note (Signed)
 History and Physical Interval Note:  04/14/2024 8:26 AM  Ronald [name]  has presented today for surgery, with the diagnosis of APPENDICITIS.  The various methods of treatment have been discussed with the patient and family. After consideration of risks, benefits and other options for treatment, the patient has consented to  Procedures: APPENDECTOMY, LAPAROSCOPIC (N/A) as a surgical intervention.  The patient's history has been reviewed, patient examined, no change in status, stable for surgery.  I have reviewed the patient's chart and labs.  Questions were answered to the patient's satisfaction.     [name]

## 2024-04-14 NOTE — Anesthesia Preprocedure Evaluation (Addendum)
"                                    Anesthesia Evaluation  Patient identified by MRN, date of birth, ID band Patient awake    Reviewed: Allergy & Precautions, NPO status , Patient's Chart, lab work & pertinent test results  Airway Mallampati: I  TM Distance: >3 FB Neck ROM: Full    Dental  (+) Poor Dentition, Dental Advisory Given   Pulmonary neg pulmonary ROS   breath sounds clear to auscultation       Cardiovascular negative cardio ROS  Rhythm:Regular Rate:Normal     Neuro/Psych  PSYCHIATRIC DISORDERS Anxiety  Bipolar Disorder   negative neurological ROS     GI/Hepatic negative GI ROS, Neg liver ROS,,,  Endo/Other  negative endocrine ROS    Renal/GU negative Renal ROS     Musculoskeletal   Abdominal   Peds  Hematology   Anesthesia Other Findings   Reproductive/Obstetrics                              Anesthesia Physical Anesthesia Plan  ASA: 2  Anesthesia Plan: General   Post-op Pain Management: Tylenol  PO (pre-op)*, Toradol  IV (intra-op)* and Gabapentin  PO (pre-op)*   Induction: Intravenous  PONV Risk Score and Plan: 3 and Ondansetron , Dexamethasone and Midazolam   Airway Management Planned: Oral ETT  Additional Equipment: None  Intra-op Plan:   Post-operative Plan: Extubation in OR  Informed Consent: I have reviewed the patients History and Physical, chart, labs and discussed the procedure including the risks, benefits and alternatives for the proposed anesthesia with the patient or authorized representative who has indicated his/her understanding and acceptance.     Dental advisory given  Plan Discussed with: CRNA  Anesthesia Plan Comments:          Anesthesia Quick Evaluation  "

## 2024-04-14 NOTE — Anesthesia Procedure Notes (Signed)
 Procedure Name: Intubation Date/Time: 04/14/2024 9:55 AM  Performed by: Dartha Meckel, CRNAPre-anesthesia Checklist: Patient identified, Emergency Drugs available, Suction available and Patient being monitored Patient Re-evaluated:Patient Re-evaluated prior to induction Oxygen Delivery Method: Circle system utilized Preoxygenation: Pre-oxygenation with 100% oxygen Induction Type: IV induction Ventilation: Mask ventilation without difficulty Laryngoscope Size: Mac and 4 Grade View: Grade I Tube type: Oral Tube size: 7.5 mm Number of attempts: 1 Airway Equipment and Method: Stylet and Oral airway Placement Confirmation: ETT inserted through vocal cords under direct vision, positive ETCO2 and breath sounds checked- equal and bilateral Secured at: 22 cm Tube secured with: Tape Dental Injury: Teeth and Oropharynx as per pre-operative assessment

## 2024-04-14 NOTE — Transfer of Care (Signed)
 Immediate Anesthesia Transfer of Care Note  Patient: Ronald [name]  Procedure(s) Performed: APPENDECTOMY, LAPAROSCOPIC (Abdomen)  Patient Location: PACU  Anesthesia Type:General  Level of Consciousness: awake and alert   Airway & Oxygen Therapy: Patient Spontanous Breathing and Patient connected to face mask oxygen  Post-op Assessment: Report given to RN and Post -op Vital signs reviewed and stable  Post vital signs: Reviewed and stable  Last Vitals:  Vitals Value Taken Time  BP 161/95 04/14/24 11:02  Temp 36.4 C 04/14/24 11:02  Pulse 55 04/14/24 11:05  Resp 18 04/14/24 11:05  SpO2 100 % 04/14/24 11:05  Vitals shown include unfiled device data.  Last Pain:  Vitals:   04/14/24 0930  TempSrc: Oral  PainSc:       Patients Stated Pain Goal: 2 (04/13/24 1814)  Complications: No notable events documented.

## 2024-04-14 NOTE — Plan of Care (Signed)
  Problem: Education: Goal: Knowledge of General Education information will improve Description: Including pain rating scale, medication(s)/side effects and non-pharmacologic comfort measures Outcome: Progressing   Problem: Clinical Measurements: Goal: Ability to maintain clinical measurements within normal limits will improve Outcome: Progressing   Problem: Pain Managment: Goal: General experience of comfort will improve and/or be controlled Outcome: Not Progressing

## 2024-04-14 NOTE — Progress Notes (Signed)
 PHARMACY - PHYSICIAN COMMUNICATION CRITICAL VALUE ALERT - BLOOD CULTURE IDENTIFICATION (BCID)  Ronald [name] is an 43 y.o. male who presented to [facility] on 04/12/2024 with a chief complaint of abdominal pain & vomiting.  Assessment:   Admit with appendicitis s/p appendectomy today.  Blood now + [facility] in anaerobic bottle of 1 set only; BCID + Staph species.  Most likely contaminant.  Name of physician (or Provider) Contacted: Rubin  Current antibiotics: none  Changes to prescribed antibiotics recommended:  Continue to monitor off antibiotics  Results for orders placed or performed during the hospital encounter of 04/12/24  Blood Culture ID Panel (Reflexed) (Collected: 04/13/2024  6:48 PM)  Result Value Ref Range   Enterococcus faecalis NOT DETECTED NOT DETECTED   Enterococcus Faecium NOT DETECTED NOT DETECTED   Listeria monocytogenes NOT DETECTED NOT DETECTED   Staphylococcus species DETECTED (A) NOT DETECTED   Staphylococcus aureus (BCID) NOT DETECTED NOT DETECTED   Staphylococcus epidermidis NOT DETECTED NOT DETECTED   Staphylococcus lugdunensis NOT DETECTED NOT DETECTED   Streptococcus species NOT DETECTED NOT DETECTED   Streptococcus agalactiae NOT DETECTED NOT DETECTED   Streptococcus pneumoniae NOT DETECTED NOT DETECTED   Streptococcus pyogenes NOT DETECTED NOT DETECTED   A.calcoaceticus-baumannii NOT DETECTED NOT DETECTED   Bacteroides fragilis NOT DETECTED NOT DETECTED   Enterobacterales NOT DETECTED NOT DETECTED   Enterobacter cloacae complex NOT DETECTED NOT DETECTED   Escherichia coli NOT DETECTED NOT DETECTED   Klebsiella aerogenes NOT DETECTED NOT DETECTED   Klebsiella oxytoca NOT DETECTED NOT DETECTED   Klebsiella pneumoniae NOT DETECTED NOT DETECTED   Proteus species NOT DETECTED NOT DETECTED   Salmonella species NOT DETECTED NOT DETECTED   Serratia marcescens NOT DETECTED NOT DETECTED   Haemophilus influenzae NOT DETECTED NOT DETECTED   Neisseria  meningitidis NOT DETECTED NOT DETECTED   Pseudomonas aeruginosa NOT DETECTED NOT DETECTED   Stenotrophomonas maltophilia NOT DETECTED NOT DETECTED   Candida albicans NOT DETECTED NOT DETECTED   Candida auris NOT DETECTED NOT DETECTED   Candida glabrata NOT DETECTED NOT DETECTED   Candida krusei NOT DETECTED NOT DETECTED   Candida parapsilosis NOT DETECTED NOT DETECTED   Candida tropicalis NOT DETECTED NOT DETECTED   Cryptococcus neoformans/gattii NOT DETECTED NOT DETECTED    Rosaline Millet PharmD 04/14/2024  8:18 PM

## 2024-04-14 NOTE — Telephone Encounter (Signed)
 Called the patient and LVM letting him know that Dr Sagardia is not taking on new patients at this time unfortunately but may switch to  NP if he is desire.

## 2024-04-14 NOTE — Anesthesia Postprocedure Evaluation (Addendum)
"   Anesthesia Post Note  Patient: Ronald [name]  Procedure(s) Performed: APPENDECTOMY, LAPAROSCOPIC (Abdomen)     Patient location during evaluation: PACU Anesthesia Type: General Level of consciousness: awake and alert Pain management: pain level controlled Vital Signs Assessment: post-procedure vital signs reviewed and stable Respiratory status: spontaneous breathing, nonlabored ventilation, respiratory function stable and patient connected to nasal cannula oxygen Cardiovascular status: blood pressure returned to baseline and stable Postop Assessment: no apparent nausea or vomiting Anesthetic complications: no   No notable events documented.  Last Vitals:  Vitals:   04/14/24 1145 04/14/24 1206  BP: 128/84 (!) 141/87  Pulse: 63 (!) 56  Resp: 13 16  Temp: 36.6 C 36.6 C  SpO2: 97% 97%                 [name]      "

## 2024-04-14 NOTE — Op Note (Signed)
 Preoperative diagnosis: appendicitis Postoperative diagnosis: saa Procedure: Laparoscopic appendectomy Dr Adina Bury Anesthesia general EBL minimal Complications none Drains none Specimens appendix to pathology  Sponge and needle count correct Dispo recovery stable.  Indications: 43 y/o M with a history of bipolar disorder and recent diagnosis of appendicitis who presents with abdominal pain, nausea, p.o. intolerance.  He initially presented to the emergency department on 03/29/2024 with abdominal pain where he was diagnosed with appendicitis.  He was admitted to the hospital for 5 days for nonoperative management of appendicitis with antibiotics and gradual diet advancement.  He was discharged home on 12/20.  He never really got better and represented with same symptoms CT still showed appendicitis. We discussed appendectomy  Procedure: After informed consent was obtained he was taken to the operating room.  He was placed under general anesthesia without complication.  He had already been given antibiotics.  SCDs were placed.   he was prepped and draped in a sterile sterile surgical fashion.  Surgical timeout was then performed.  I infiltrated Marcaine below his umbilicus.  I made a vertical incision.  I grasped his fascia and incised this sharply.  I entered the peritoneum bluntly without injury.  I placed a 0 Vicryl pursestring suture and a Hassan trocar.  The abdomen was insufflated to 15 mmHg pressure.  I then inserted 2 additional 5 mm trocars in the lower abdomen without injury.  I was able to identify the terminal ileum and the cecum.  The cecum was adherent to the retroperitoneum.  I took this off the structures bluntly.  I then used a harmonic scalpel to take down the white line and completely mobilized the right colon so I could visualize where the appendix was.  I was able to traced the terminal ileum back to the cecum.  The appendix was very chronically inflamed and adherent to the  small bowel.  I removed this using a combination of blunt dissection as well as scissors.  Eventually I was able to identify the appendix all the way to its base at the cecum.  I then took the appendiceal mesentery with the harmonic scalpel.  I then elected to use the stapler to remove a small cuff of the cecum avoiding injuring the terminal ileum.  This took 2 loads and I remove the appendix and the small cuff of cecum.  I then placed this in a retrieval bag and removed from the abdomen.  The staple line looked healthy.  This was all hemostatic.  I then removed the [facility] trocar and tied my pursestring down.  I placed an additional 2-0 Vicryl sutures to completely obliterate the defect.  I then desufflated the abdomen to remove the remaining trocars.  These were closed with 4 Monocryl and glue.  He tolerated this well was extubated and transferred recovery stable.

## 2024-04-14 NOTE — Plan of Care (Signed)
 ?  Problem: Clinical Measurements: ?Goal: Will remain free from infection ?Outcome: Progressing ?  ?

## 2024-04-15 ENCOUNTER — Encounter (HOSPITAL_COMMUNITY): Payer: Self-pay | Admitting: General Surgery

## 2024-04-15 MED ORDER — KETOROLAC TROMETHAMINE 15 MG/ML IJ SOLN
30.0000 mg | Freq: Once | INTRAMUSCULAR | Status: AC
  Administered 2024-04-15: 30 mg via INTRAVENOUS
  Filled 2024-04-15: qty 2

## 2024-04-15 MED ORDER — METHOCARBAMOL 500 MG PO TABS
500.0000 mg | ORAL_TABLET | Freq: Three times a day (TID) | ORAL | Status: DC | PRN
  Administered 2024-04-15 – 2024-04-17 (×5): 500 mg via ORAL
  Filled 2024-04-15 (×5): qty 1

## 2024-04-15 NOTE — Progress Notes (Addendum)
 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 Pt is refusing to get OOB, has refused SCD hose and also refused IS exercises. Allowed pt to vent concerns, CN spoke w pt and support offered.

## 2024-04-15 NOTE — Progress Notes (Signed)
 1 Day Post-Op   Subjective/Chief Complaint: Tol diet, voiding, states 10/10 pain but sleeping   Objective: Vital signs in last 24 hours: Temp:  [97.5 F (36.4 C)-99.3 F (37.4 C)] 98.4 F (36.9 C) (01/01 0538) Pulse Rate:  [56-85] 63 (01/01 0538) Resp:  [11-18] 16 (01/01 0538) BP: (105-161)/(70-95) 128/74 (01/01 0538) SpO2:  [94 %-100 %] 99 % (01/01 0538) Last BM Date : 04/12/24  Intake/Output from previous day: 12/31 0701 - 01/01 0700 In: 2770 [P.O.:1320; I.V.:1350; IV Piggyback:100] Out: 1960 [Urine:1950; Blood:10] Intake/Output this shift: No intake/output data recorded.  General nad Cv regular Pulm effort normal Ab approp tender incisions clean  Lab Results:  Recent Labs    04/13/24 0844 04/14/24 0529  WBC 8.6 4.7  HGB 15.3 13.7  HCT 44.1 40.5  PLT 307 199   BMET Recent Labs    04/12/24 2054 04/14/24 0529  NA 138 140  K 4.3 4.5  CL 100 101  CO2 29 29  GLUCOSE 85 78  BUN 9 <5*  CREATININE 0.87 1.03  CALCIUM  9.1 9.6   PT/INR No results for input(s): LABPROT, INR in the last 72 hours. ABG No results for input(s): PHART, HCO3 in the last 72 hours.  Invalid input(s): PCO2, PO2  Studies/Results: CT ABDOMEN PELVIS W CONTRAST Result Date: 04/13/2024 EXAM: CT ABDOMEN AND PELVIS WITH CONTRAST 04/13/2024 11:06:32 AM TECHNIQUE: CT of the abdomen and pelvis was performed with the administration of 100 mL of iohexol  (OMNIPAQUE ) 300 MG/ML solution. Multiplanar reformatted images are provided for review. Automated exposure control, iterative reconstruction, and/or weight-based adjustment of the mA/kV was utilized to reduce the radiation dose to as low as reasonably achievable. COMPARISON: CT abdomen and pelvis 04/06/2024, 03/29/24 CLINICAL HISTORY: RLQ abdominal pain worsening, known chronic appendicitis. Pt complains of worsening RLQ abdominal pain, nausea, and vomiting. Known chronic appendicitis currently on antibiotic therapy. Following up with  surgery. States his pain has worsened and he cannot keep anything down. FINDINGS: LOWER CHEST: No acute abnormality. LIVER: The liver is unremarkable. GALLBLADDER AND BILE DUCTS: Gallbladder is unremarkable. No biliary ductal dilatation. SPLEEN: No acute abnormality. PANCREAS: No acute abnormality. ADRENAL GLANDS: No acute abnormality. KIDNEYS, URETERS AND BLADDER: No stones in the kidneys or ureters. No hydronephrosis. No perinephric or periureteral stranding. Urinary bladder is unremarkable. GI AND BOWEL: Stomach demonstrates no acute abnormality. There is no bowel obstruction. APPENDIX: The appendix is thickened in caliber, measuring up to 12 mm. No associated appendicolith. No associated periappendiceal fat stranding. No right lower quadrant inflammatory changes. PERITONEUM AND RETROPERITONEUM: No ascites. No free air. No organized fluid collection. VASCULATURE: Aorta is normal in caliber. LYMPH NODES: No lymphadenopathy. REPRODUCTIVE ORGANS: No acute abnormality. BONES AND SOFT TISSUES: Left L5-S1 pseudoarthrosis. No focal soft tissue abnormality. IMPRESSION: 1. Thickened appendix measuring up to 12 mm without appendicolith or periappendiceal inflammatory change. Finding may represent developing acute appendicitis. Electronically signed by: Morgane Naveau MD 04/13/2024 01:04 PM EST RP Workstation: [id number]    Anti-infectives: Anti-infectives (From admission, onward)    Start     Dose/Rate Route Frequency Ordered Stop   04/14/24 0845  ceFAZolin (ANCEF) IVPB 2g/100 mL premix        2 g 200 mL/hr over 30 Minutes Intravenous On call to O.R. 04/14/24 0749 04/14/24 1036   04/13/24 1600  metroNIDAZOLE  (FLAGYL ) IVPB 500 mg  Status:  Discontinued       Placed in And Linked Group   500 mg 100 mL/hr over 60 Minutes Intravenous Every 12 hours 04/13/24 1508  04/14/24 1402   04/13/24 1530  cefTRIAXone  (ROCEPHIN ) 2 g in sodium chloride  0.9 % 100 mL IVPB  Status:  Discontinued       Placed in And Linked  Group   2 g 200 mL/hr over 30 Minutes Intravenous Every 24 hours 04/13/24 1508 04/14/24 1402   04/13/24 0845  amoxicillin -clavulanate (AUGMENTIN ) 875-125 MG per tablet 1 tablet        1 tablet Oral  Once 04/13/24 0835 04/13/24 0846       Assessment/Plan: POD 1 lap appy -regular diet -pain control -if does well and can ambulate with good pain control can go  home today although this may not occur -lovenox    [name] 04/15/2024

## 2024-04-16 LAB — CULTURE, BLOOD (ROUTINE X 2): Special Requests: ADEQUATE

## 2024-04-16 MED ORDER — KETOROLAC TROMETHAMINE 30 MG/ML IJ SOLN
30.0000 mg | Freq: Three times a day (TID) | INTRAMUSCULAR | Status: AC
  Administered 2024-04-16 – 2024-04-18 (×6): 30 mg via INTRAVENOUS
  Filled 2024-04-16 (×6): qty 1

## 2024-04-16 MED ORDER — PANTOPRAZOLE SODIUM 40 MG PO TBEC
40.0000 mg | DELAYED_RELEASE_TABLET | Freq: Every day | ORAL | Status: DC
  Administered 2024-04-16 – 2024-04-18 (×3): 40 mg via ORAL
  Filled 2024-04-16 (×3): qty 1

## 2024-04-16 NOTE — Progress Notes (Signed)
 2 Days Post-Op   Subjective/Chief Complaint: Tol diet, voiding, states 10/10 pain but was sleeping Breakfast eaten tray States like it feels his abd is ripped in half especially when moves Reports having a BM   Objective: Vital signs in last 24 hours: Temp:  [98.5 F (36.9 C)-98.6 F (37 C)] 98.5 F (36.9 C) (01/02 0542) Pulse Rate:  [64-75] 75 (01/02 0542) Resp:  [16-18] 16 (01/02 0542) BP: (125-129)/(87-92) 129/92 (01/02 0542) SpO2:  [93 %-99 %] 93 % (01/02 0542) Last BM Date : 04/12/24  Intake/Output from previous day: 01/01 0701 - 01/02 0700 In: 2160 [P.O.:2160] Out: 700 [Urine:700] Intake/Output this shift: No intake/output data recorded.  General nad Cv regular Pulm effort normal Ab approp tender incisions clean  Lab Results:  Recent Labs    04/14/24 0529  WBC 4.7  HGB 13.7  HCT 40.5  PLT 199   BMET Recent Labs    04/14/24 0529  NA 140  K 4.5  CL 101  CO2 29  GLUCOSE 78  BUN <5*  CREATININE 1.03  CALCIUM  9.6   PT/INR No results for input(s): LABPROT, INR in the last 72 hours. ABG No results for input(s): PHART, HCO3 in the last 72 hours.  Invalid input(s): PCO2, PO2  Studies/Results: No results found.   Anti-infectives: Anti-infectives (From admission, onward)    Start     Dose/Rate Route Frequency Ordered Stop   04/14/24 0845  ceFAZolin (ANCEF) IVPB 2g/100 mL premix        2 g 200 mL/hr over 30 Minutes Intravenous On call to O.R. 04/14/24 0749 04/14/24 1036   04/13/24 1600  metroNIDAZOLE  (FLAGYL ) IVPB 500 mg  Status:  Discontinued       Placed in And Linked Group   500 mg 100 mL/hr over 60 Minutes Intravenous Every 12 hours 04/13/24 1508 04/14/24 1402   04/13/24 1530  cefTRIAXone  (ROCEPHIN ) 2 g in sodium chloride  0.9 % 100 mL IVPB  Status:  Discontinued       Placed in And Linked Group   2 g 200 mL/hr over 30 Minutes Intravenous Every 24 hours 04/13/24 1508 04/14/24 1402   04/13/24 0845  amoxicillin -clavulanate  (AUGMENTIN ) 875-125 MG per tablet 1 tablet        1 tablet Oral  Once 04/13/24 0835 04/13/24 0846       Assessment/Plan: POD 2 lap appy -regular diet -work on pain control -if does well and can ambulate with good pain control can go  home today although this may not occur -lovenox    Camellia Blush 04/16/2024

## 2024-04-16 NOTE — Plan of Care (Signed)
   Problem: Nutrition: Goal: Adequate nutrition will be maintained Outcome: Adequate for Discharge

## 2024-04-17 NOTE — Progress Notes (Signed)
 3 Days Post-Op   Subjective/Chief Complaint: Tol diet, voiding, states pain is better but was sleeping Breakfast eaten tray Reports he fell last evening Some nausea still at times   Objective: Vital signs in last 24 hours: Temp:  [98.1 F (36.7 C)-98.3 F (36.8 C)] 98.1 F (36.7 C) (01/03 0609) Pulse Rate:  [62-93] 62 (01/03 0609) Resp:  [15-18] 15 (01/03 0609) BP: (101-131)/(74-93) 101/74 (01/03 0609) SpO2:  [96 %-98 %] 96 % (01/03 0609) Last BM Date : 04/16/24 (Per patient)  Intake/Output from previous day: 01/02 0701 - 01/03 0700 In: 1880 [P.O.:1880] Out: 500 [Urine:500] Intake/Output this shift: No intake/output data recorded.  General nad Cv regular Pulm effort normal Ab approp tender incisions clean, much less TTP today, ND No external signs of trauma  Lab Results:  No results for input(s): WBC, HGB, HCT, PLT in the last 72 hours.  BMET No results for input(s): NA, K, CL, CO2, GLUCOSE, BUN, CREATININE, CALCIUM  in the last 72 hours.  PT/INR No results for input(s): LABPROT, INR in the last 72 hours. ABG No results for input(s): PHART, HCO3 in the last 72 hours.  Invalid input(s): PCO2, PO2  Studies/Results: No results found.   Anti-infectives: Anti-infectives (From admission, onward)    Start     Dose/Rate Route Frequency Ordered Stop   04/14/24 0845  ceFAZolin  (ANCEF ) IVPB 2g/100 mL premix        2 g 200 mL/hr over 30 Minutes Intravenous On call to O.R. 04/14/24 0749 04/14/24 1036   04/13/24 1600  metroNIDAZOLE  (FLAGYL ) IVPB 500 mg  Status:  Discontinued       Placed in And Linked Group   500 mg 100 mL/hr over 60 Minutes Intravenous Every 12 hours 04/13/24 1508 04/14/24 1402   04/13/24 1530  cefTRIAXone  (ROCEPHIN ) 2 g in sodium chloride  0.9 % 100 mL IVPB  Status:  Discontinued       Placed in And Linked Group   2 g 200 mL/hr over 30 Minutes Intravenous Every 24 hours 04/13/24 1508 04/14/24 1402   04/13/24  0845  amoxicillin -clavulanate (AUGMENTIN ) 875-125 MG per tablet 1 tablet        1 tablet Oral  Once 04/13/24 0835 04/13/24 0846       Assessment/Plan: POD 3 lap appy Dr Ebbie -regular diet -work on pain control -if does well and can ambulate with good pain control can go  home Sunday -lovenox    Camellia Blush 04/17/2024

## 2024-04-17 NOTE — Plan of Care (Signed)
   Problem: Nutrition: Goal: Adequate nutrition will be maintained Outcome: Adequate for Discharge

## 2024-04-18 ENCOUNTER — Other Ambulatory Visit (HOSPITAL_COMMUNITY): Payer: Self-pay

## 2024-04-18 LAB — CULTURE, BLOOD (ROUTINE X 2)
Culture: NO GROWTH
Special Requests: ADEQUATE

## 2024-04-18 MED ORDER — ONDANSETRON HCL 4 MG PO TABS
4.0000 mg | ORAL_TABLET | Freq: Every day | ORAL | 0 refills | Status: AC | PRN
  Filled 2024-04-18: qty 30, 30d supply, fill #0

## 2024-04-18 MED ORDER — OXYCODONE HCL 5 MG PO TABS
5.0000 mg | ORAL_TABLET | ORAL | 0 refills | Status: AC | PRN
  Filled 2024-04-18: qty 20, 4d supply, fill #0

## 2024-04-18 NOTE — TOC Initial Note (Signed)
 Transition of Care [facility]) - Initial/Assessment Note    Patient Details  Name: Ronald [name] MRN: [id number] Date of Birth: 09/29/1980  Transition of Care [facility]) CM/SW Contact:    Sonda Manuella Quill, RN Phone Number: 04/18/2024, 10:10 AM  Clinical Narrative:                 No insurance listed; spoke w/ pt in room; pt said he lives at home w/ his mother [name] [phone]); he plans to return w/ her support at d/c; she will provide transportation; PCP verified; pt said he does not have insurance; he denied resources for insurance; pt said his RXs go to [facility]; pt denied SDOH risks; he does not have DME, HH services, or home oxygen; no IP CM needs. Expected Discharge Plan: Home/Self Care Barriers to Discharge: No Barriers Identified   Patient Goals and CMS Choice            Expected Discharge Plan and Services       Living arrangements for the past 2 months: Single Family Home Expected Discharge Date: 04/18/24               DME Arranged: N/A DME Agency: NA       HH Arranged: NA HH Agency: NA        Prior Living Arrangements/Services Living arrangements for the past 2 months: Single Family Home Lives with:: Parents Patient language and need for interpreter reviewed:: Yes Do you feel safe going back to the place where you live?: Yes      Need for Family Participation in Patient Care: Yes (Comment) Care giver support system in place?: Yes (comment) Current home services:  (n/a) Criminal Activity/Legal Involvement Pertinent to Current Situation/Hospitalization: No - Comment as needed  Activities of Daily Living   ADL Screening (condition at time of admission) Independently performs ADLs?: Yes (appropriate for developmental age) Is the patient deaf or have difficulty hearing?: No Does the patient have difficulty seeing, even when wearing glasses/contacts?: No Does the patient have difficulty concentrating, remembering, or making decisions?:  No  Permission Sought/Granted Permission sought to share information with : Case Manager Permission granted to share information with : Yes, Verbal Permission Granted  Share Information with NAME: Case Manager     Permission granted to share info w Relationship: [name] (mother) [phone]     Emotional Assessment Appearance:: Appears stated age Attitude/Demeanor/Rapport: Gracious Affect (typically observed): Accepting Orientation: : Oriented to Self, Oriented to Place, Oriented to  Time, Oriented to Situation Alcohol / Substance Use: Not Applicable Psych Involvement: No (comment)  Admission diagnosis:  Other appendicitis [K36] Acute appendicitis [K35.80] Patient Active Problem List   Diagnosis Date Noted   Chronic appendicitis 04/04/2024   Multiple drug allergies 04/04/2024   History of bipolar disorder 04/04/2024   Acute appendicitis 03/30/2024   Non-cardiac chest pain 02/10/2024   Cannabis use disorder 05/19/2023   Schizoaffective disorder, bipolar type (HCC) 05/18/2023   Encounter for screening for HIV 11/08/2022   Need for hepatitis C screening test 11/08/2022   Therapeutic drug monitoring 11/08/2022   Bipolar 1 disorder, depressed, full remission 11/08/2022   PTSD (post-traumatic stress disorder) 09/27/2021   Bipolar affective disorder, currently active (HCC) 09/27/2021   Severe bipolar I disorder, recurrent manic episode, with psychotic behavior (HCC) 08/13/2021   Bipolar 1 disorder with moderate mania (HCC) 06/03/2021   GAD (generalized anxiety disorder) 12/15/2016   Disorder of pineal gland    PCP:  Joshua Debby CROME, MD  Pharmacy:   [facility] [address], [name] - [address] [name] [address] Phone: [phone] Fax: [phone]     Social Drivers of Health (SDOH) Social History: SDOH Screenings   Food Insecurity: No Food Insecurity (04/18/2024)  Recent Concern: Food Insecurity - Food Insecurity Present  (03/30/2024)  Housing: Low Risk (04/18/2024)  Transportation Needs: No Transportation Needs (04/18/2024)  Utilities: Not At Risk (04/18/2024)  Alcohol Screen: Low Risk (12/27/2023)  Depression (PHQ2-9): Low Risk (03/04/2024)  Tobacco Use: High Risk (04/14/2024)   SDOH Interventions: Food Insecurity Interventions: Intervention Not Indicated, Inpatient TOC Housing Interventions: Intervention Not Indicated, Inpatient TOC Transportation Interventions: Intervention Not Indicated, Inpatient TOC Utilities Interventions: Intervention Not Indicated, Inpatient TOC   Readmission Risk Interventions    04/06/2024    9:06 AM  Readmission Risk Prevention Plan  Transportation Screening Complete  PCP or Specialist Appt within 3-5 Days Complete  HRI or Home Care Consult Complete  Social Work Consult for Recovery Care Planning/Counseling Complete  Palliative Care Screening Not Applicable  Medication Review Oceanographer) Complete

## 2024-04-18 NOTE — Plan of Care (Signed)
 ?  Problem: Clinical Measurements: ?Goal: Will remain free from infection ?Outcome: Progressing ?  ?

## 2024-04-18 NOTE — Progress Notes (Signed)
 4 Days Post-Op   Subjective/Chief Complaint: Tol diet, voiding, states he is ready to go home Some nausea still at times Been walking in halls   Objective: Vital signs in last 24 hours: Temp:  [98.5 F (36.9 C)-98.6 F (37 C)] 98.5 F (36.9 C) (01/04 0608) Pulse Rate:  [63-80] 63 (01/04 0608) Resp:  [15-18] 15 (01/04 0608) BP: (115-127)/(76-87) 127/87 (01/04 9391) SpO2:  [91 %-99 %] 91 % (01/04 0608) Last BM Date : 04/18/24  Intake/Output from previous day: 01/03 0701 - 01/04 0700 In: 1780 [P.O.:1780] Out: 100 [Urine:100] Intake/Output this shift: No intake/output data recorded.  General nad Cv regular Pulm effort normal Ab approp tender incisions clean, essentially nontender today, ND No external signs of trauma  Lab Results:  No results for input(s): WBC, HGB, HCT, PLT in the last 72 hours.  BMET No results for input(s): NA, K, CL, CO2, GLUCOSE, BUN, CREATININE, CALCIUM  in the last 72 hours.  PT/INR No results for input(s): LABPROT, INR in the last 72 hours. ABG No results for input(s): PHART, HCO3 in the last 72 hours.  Invalid input(s): PCO2, PO2  Studies/Results: No results found.   Anti-infectives: Anti-infectives (From admission, onward)    Start     Dose/Rate Route Frequency Ordered Stop   04/14/24 0845  ceFAZolin  (ANCEF ) IVPB 2g/100 mL premix        2 g 200 mL/hr over 30 Minutes Intravenous On call to O.R. 04/14/24 0749 04/14/24 1036   04/13/24 1600  metroNIDAZOLE  (FLAGYL ) IVPB 500 mg  Status:  Discontinued       Placed in And Linked Group   500 mg 100 mL/hr over 60 Minutes Intravenous Every 12 hours 04/13/24 1508 04/14/24 1402   04/13/24 1530  cefTRIAXone  (ROCEPHIN ) 2 g in sodium chloride  0.9 % 100 mL IVPB  Status:  Discontinued       Placed in And Linked Group   2 g 200 mL/hr over 30 Minutes Intravenous Every 24 hours 04/13/24 1508 04/14/24 1402   04/13/24 0845  amoxicillin -clavulanate (AUGMENTIN )  875-125 MG per tablet 1 tablet        1 tablet Oral  Once 04/13/24 9164 04/13/24 0846       Assessment/Plan: POD 4 lap appy Dr Ebbie -regular diet -work on pain control -if does well and can ambulate with good pain control can go  home Sunday -lovenox  Discussed dc instructions with pt and what to call to for  Camellia Blush 04/18/2024

## 2024-04-18 NOTE — Discharge Instructions (Signed)
 You were hospitalized for because your gallbladder was inflamed. We got your gallbladder taken out Thank you for allowing us  to be part of your care.   Your appointment is with Dr. Azadegan on 12/19 at 10:15 am. Please go to this appointment  Please note these changes made to your medications:  *Please START taking:  We are starting you on jardiance  10 mg. Please start taking this tomorrow(12/14) and take only one tablet a day   We are also giving you some medication that you can take over the next couple of days for pain. Please take this for severe pain. Make sure you are still having regular bowel movements with it.   Please call our clinic if you have any questions or concerns, we may be able to help and keep you from a long and expensive emergency room wait. Our clinic and after hours phone number is [phone], the best time to call is Monday through Friday 9 am to 4 pm but there is always someone available 24/7 if you have an emergency. If you need medication refills please notify your pharmacy one week in advance and they will send us  a request.      LAPAROSCOPIC SURGERY: POST OP INSTRUCTIONS Always review your discharge instruction sheet given to you by the facility where your surgery was performed. IF YOU HAVE DISABILITY OR FAMILY LEAVE FORMS, YOU MUST BRING THEM TO THE OFFICE FOR PROCESSING.   DO NOT GIVE THEM TO YOUR DOCTOR.  PAIN CONTROL  First take acetaminophen  (Tylenol ) AND/or ibuprofen (Advil) to control your pain after surgery.  Follow directions on package.  Taking acetaminophen  (Tylenol ) and/or ibuprofen (Advil) regularly after surgery will help to control your pain and lower the amount of prescription pain medication you may need.  You should not take more than 3,000 mg (3 grams) of acetaminophen  (Tylenol ) in 24 hours.  You should not take ibuprofen (Advil), aleve, motrin, naprosyn or other NSAIDS if you have a history of stomach ulcers or chronic kidney disease.  A  prescription for pain medication may be given to you upon discharge.  Take your pain medication as prescribed, if you still have uncontrolled pain after taking acetaminophen  (Tylenol ) or ibuprofen (Advil). Use ice packs to help control pain. If you need a refill on your pain medication, please contact your pharmacy.  They will contact our office to request authorization. Prescriptions will not be filled after 5pm or on week-ends.  HOME MEDICATIONS Take your usually prescribed medications unless otherwise directed.  DIET You should follow a light diet the first few days after arrival home.  Be sure to include lots of fluids daily. Avoid fatty, fried foods.   CONSTIPATION It is common to experience some constipation after surgery and if you are taking pain medication.  Increasing fluid intake and taking a stool softener (such as Colace) will usually help or prevent this problem from occurring.  A mild laxative (Milk of Magnesia or Miralax ) should be taken according to package instructions if there are no bowel movements after 48 hours.  WOUND/INCISION CARE Most patients will experience some swelling and bruising in the area of the incisions.  Ice packs will help.  Swelling and bruising can take several days to resolve.  Unless discharge instructions indicate otherwise, follow guidelines below  STERI-STRIPS - you may remove your outer bandages 48 hours after surgery, and you may shower at that time.  You have steri-strips (small skin tapes) in place directly over the incision.  These strips should  be left on the skin for 7-10 days.   DERMABOND/SKIN GLUE - you may shower in 24 hours.  The glue will flake off over the next 2-3 weeks. Any sutures or staples will be removed at the office during your follow-up visit.  ACTIVITIES You may resume regular (light) daily activities beginning the next day--such as daily self-care, walking, climbing stairs--gradually increasing activities as tolerated.  You may  have sexual intercourse when it is comfortable.  Refrain from any heavy lifting or straining until approved by your doctor. You may drive when you are no longer taking prescription pain medication, you can comfortably wear a seatbelt, and you can safely maneuver your car and apply brakes.  FOLLOW-UP You should see your doctor in the office for a follow-up appointment approximately 2-3 weeks after your surgery.  You should have been given your post-op/follow-up appointment when your surgery was scheduled.  If you did not receive a post-op/follow-up appointment, make sure that you call for this appointment within a day or two after you arrive home to insure a convenient appointment time.  OTHER INSTRUCTIONS  WHEN TO CALL YOUR DOCTOR: Fever over 101.0 Inability to urinate Continued bleeding from incision. Increased pain, redness, or drainage from the incision. Increasing abdominal pain  The clinic staff is available to answer your questions during regular business hours.  Please dont hesitate to call and ask to speak to one of the nurses for clinical concerns.  If you have a medical emergency, go to the nearest emergency room or call 911.  A surgeon from [facility] is always on call at the hospital. [address], [address], [place], [name]  [address] ? [address] [address], [place], [name]   [address] [phone] ? [phone] ? FAX [phone] Web site: [email or web address]

## 2024-04-19 LAB — SURGICAL PATHOLOGY

## 2024-04-20 ENCOUNTER — Ambulatory Visit (HOSPITAL_COMMUNITY): Admission: RE | Admit: 2024-04-20 | Payer: Self-pay | Source: Home / Self Care | Admitting: General Surgery

## 2024-04-20 ENCOUNTER — Encounter (HOSPITAL_COMMUNITY): Admission: RE | Payer: Self-pay | Source: Home / Self Care

## 2024-04-20 SURGERY — APPENDECTOMY, LAPAROSCOPIC
Anesthesia: General

## 2024-04-22 NOTE — Discharge Summary (Signed)
 "   Patient ID: Ronald [name] [id number] [birth year] 44 y.o.  Admit date: 04/12/2024 Discharge date: 04/18/2024  Admitting Diagnosis: appendicitis  Discharge Diagnosis Patient Active Problem List   Diagnosis Date Noted   Chronic appendicitis 04/04/2024   Multiple drug allergies 04/04/2024   History of bipolar disorder 04/04/2024   Acute appendicitis 03/30/2024   Non-cardiac chest pain 02/10/2024   Cannabis use disorder 05/19/2023   Schizoaffective disorder, bipolar type (HCC) 05/18/2023   Encounter for screening for HIV 11/08/2022   Need for hepatitis C screening test 11/08/2022   Therapeutic drug monitoring 11/08/2022   Bipolar 1 disorder, depressed, full remission 11/08/2022   PTSD (post-traumatic stress disorder) 09/27/2021   Bipolar affective disorder, currently active (HCC) 09/27/2021   Severe bipolar I disorder, recurrent manic episode, with psychotic behavior (HCC) 08/13/2021   Bipolar 1 disorder with moderate mania (HCC) 06/03/2021   GAD (generalized anxiety disorder) 12/15/2016   Disorder of pineal gland     Consultants none  Reason for Admission: Ronald [name] is a 44 y/o M with a history of bipolar disorder and recent diagnosis of appendicitis who presents with abdominal pain, nausea, p.o. intolerance.  He initially presented to the emergency department on 03/29/2024 with abdominal pain where he was diagnosed with appendicitis.  He was admitted to the hospital for 5 days for nonoperative management of appendicitis with antibiotics and gradual diet advancement.  He was discharged home on 12/20.  He was scheduled for interval appendectomy early next month but re-presents today with persistent symptoms of appendicitis.  Reports vomiting after drinking water , denies constipation but reports loose nonbloody stools.  Reports chills at home.  States he has been unable to take his bipolar medications for 2 days.  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   Procedures Lap appy, Dr. Ebbie 04/14/2024  Hospital Course:  He was admitted and underwent a lap appy.  His diet was advanced as tolerated and pain was controlled.  This took several days.  He was stable for DC home on POD 4.  Information was obtained from the chart as I did not participate in this patient's care during this stay.  Allergies as of 04/18/2024       Reactions   Vistaril  [hydroxyzine ] Itching, Anxiety, Other (See Comments)   Hyperactivity    Alupent [metaproterenol] Other (See Comments)   Hyperactivity   Seroquel  [quetiapine ] Other (See Comments)   Hallucinations   Zyprexa  [olanzapine ] Other (See Comments)   made patient feel weird   Antihistamines, Chlorpheniramine-type Anxiety, Other (See Comments)   made patient feel insane - all antihistamines   Antihistamines, Diphenhydramine -type Anxiety, Other (See Comments)   made patient feel insane - all antihistamines   Antihistamines, Loratadine-type Anxiety, Other (See Comments)   made patient feel insane - all antihistamines   Clonidine And Derivatives Anxiety, Other (See Comments)   Caused a red/flushed face and the patient had a panic attack        Medication List     STOP taking these medications    amoxicillin -clavulanate 875-125 MG tablet Commonly known as: AUGMENTIN        TAKE these medications    acetaminophen  500 MG tablet Commonly known as: TYLENOL  Take 2 tablets (1,000 mg total) by mouth every 6 (six) hours. What changed:  when to take this reasons to take this   divalproex  500 MG DR tablet Commonly known as: DEPAKOTE  Take 2 tablets (1,000 mg total)  by mouth 2 (two) times daily. What changed:  when to take this additional instructions   gabapentin  100 MG capsule Commonly known as: NEURONTIN  Take 2 capsules (200 mg total) by mouth 3 (three) times daily. What changed:  when to take this additional instructions   ibuprofen  200 MG  tablet Commonly known as: ADVIL  Take 800 mg by mouth daily as needed (for pain or headaches).   Mens Multivitamin Tabs Take 1 tablet by mouth every 7 (seven) days.   methocarbamol  500 MG tablet Commonly known as: ROBAXIN  Take 1 tablet (500 mg total) by mouth every 8 (eight) hours as needed for muscle spasms.   ondansetron  4 MG tablet Commonly known as: Zofran  Take 1 tablet (4 mg total) by mouth daily as needed for nausea or vomiting.   oxyCODONE  5 MG immediate release tablet Commonly known as: Oxy IR/ROXICODONE  Take 1 tablet (5 mg total) by mouth every 4 (four) hours as needed for severe pain (pain score 7-10). What changed:  how much to take reasons to take this   polycarbophil 625 MG tablet Commonly known as: FIBERCON Take 1 tablet (625 mg total) by mouth 2 (two) times daily.   polyethylene glycol 17 g packet Commonly known as: MiraLax  Take 17 g by mouth daily as needed for mild constipation.   traZODone  50 MG tablet Commonly known as: DESYREL  Take 1 tablet (50 mg total) by mouth at bedtime.   vitamin C 1000 MG tablet Take 1,000 mg by mouth daily with breakfast.   VITAMIN D  PO Take 10,000 Units by mouth daily with breakfast.          Follow-up Information     Maczis, Puja Gosai, PA-C Follow up on 05/11/2024.   Specialty: General Surgery Why: 10 am Contact information: [address] [place] [address] [facility] [place] [facility] [name] [address] [phone]                 Signed: Burnard Banter, [facility] 04/22/2024, 3:20 PM Please see Amion for pager number during day hours 7:00am-4:30pm, 7-11:30am on Weekends  "
